# Patient Record
Sex: Female | Born: 1955 | Race: White | Hispanic: No | Marital: Married | Smoking: Former smoker
Health system: Southern US, Community
[De-identification: ages and names within clinical notes are randomized; demographics above are authoritative.]

## PROBLEM LIST (undated history)

## (undated) DIAGNOSIS — R7303 Prediabetes: Secondary | ICD-10-CM

## (undated) DIAGNOSIS — R51 Headache: Secondary | ICD-10-CM

## (undated) DIAGNOSIS — E785 Hyperlipidemia, unspecified: Secondary | ICD-10-CM

## (undated) DIAGNOSIS — R112 Nausea with vomiting, unspecified: Secondary | ICD-10-CM

## (undated) DIAGNOSIS — F419 Anxiety disorder, unspecified: Secondary | ICD-10-CM

## (undated) DIAGNOSIS — I82409 Acute embolism and thrombosis of unspecified deep veins of unspecified lower extremity: Secondary | ICD-10-CM

## (undated) DIAGNOSIS — I10 Essential (primary) hypertension: Secondary | ICD-10-CM

## (undated) DIAGNOSIS — Z923 Personal history of irradiation: Secondary | ICD-10-CM

## (undated) DIAGNOSIS — C73 Malignant neoplasm of thyroid gland: Secondary | ICD-10-CM

## (undated) DIAGNOSIS — C7931 Secondary malignant neoplasm of brain: Secondary | ICD-10-CM

## (undated) DIAGNOSIS — Z86010 Personal history of colonic polyps: Secondary | ICD-10-CM

## (undated) DIAGNOSIS — I2699 Other pulmonary embolism without acute cor pulmonale: Secondary | ICD-10-CM

## (undated) DIAGNOSIS — E049 Nontoxic goiter, unspecified: Secondary | ICD-10-CM

## (undated) DIAGNOSIS — N390 Urinary tract infection, site not specified: Secondary | ICD-10-CM

## (undated) DIAGNOSIS — R911 Solitary pulmonary nodule: Secondary | ICD-10-CM

## (undated) DIAGNOSIS — J189 Pneumonia, unspecified organism: Secondary | ICD-10-CM

## (undated) DIAGNOSIS — E119 Type 2 diabetes mellitus without complications: Secondary | ICD-10-CM

## (undated) DIAGNOSIS — F329 Major depressive disorder, single episode, unspecified: Secondary | ICD-10-CM

## (undated) DIAGNOSIS — B0089 Other herpesviral infection: Secondary | ICD-10-CM

## (undated) DIAGNOSIS — K219 Gastro-esophageal reflux disease without esophagitis: Secondary | ICD-10-CM

## (undated) DIAGNOSIS — J449 Chronic obstructive pulmonary disease, unspecified: Secondary | ICD-10-CM

## (undated) DIAGNOSIS — M797 Fibromyalgia: Secondary | ICD-10-CM

## (undated) DIAGNOSIS — K1233 Oral mucositis (ulcerative) due to radiation: Secondary | ICD-10-CM

## (undated) DIAGNOSIS — M1990 Unspecified osteoarthritis, unspecified site: Secondary | ICD-10-CM

## (undated) DIAGNOSIS — Z9889 Other specified postprocedural states: Secondary | ICD-10-CM

## (undated) DIAGNOSIS — F32A Depression, unspecified: Secondary | ICD-10-CM

## (undated) DIAGNOSIS — J439 Emphysema, unspecified: Secondary | ICD-10-CM

## (undated) DIAGNOSIS — Z860101 Personal history of adenomatous and serrated colon polyps: Secondary | ICD-10-CM

## (undated) HISTORY — DX: Personal history of colonic polyps: Z86.010

## (undated) HISTORY — DX: Emphysema, unspecified: J43.9

## (undated) HISTORY — DX: Acute embolism and thrombosis of unspecified deep veins of unspecified lower extremity: I82.409

## (undated) HISTORY — DX: Urinary tract infection, site not specified: N39.0

## (undated) HISTORY — DX: Solitary pulmonary nodule: R91.1

## (undated) HISTORY — DX: Personal history of adenomatous and serrated colon polyps: Z86.0101

## (undated) HISTORY — DX: Hyperlipidemia, unspecified: E78.5

## (undated) HISTORY — DX: Personal history of irradiation: Z92.3

## (undated) HISTORY — DX: Secondary malignant neoplasm of brain: C79.31

## (undated) HISTORY — PX: LYMPH GLAND EXCISION: SHX13

## (undated) HISTORY — DX: Malignant neoplasm of thyroid gland: C73

## (undated) HISTORY — DX: Other pulmonary embolism without acute cor pulmonale: I26.99

---

## 1994-04-05 HISTORY — PX: ABDOMINAL HYSTERECTOMY: SHX81

## 1996-04-05 HISTORY — PX: SHOULDER SURGERY: SHX246

## 1998-04-05 HISTORY — PX: CHOLECYSTECTOMY: SHX55

## 1998-06-23 ENCOUNTER — Other Ambulatory Visit: Admission: RE | Admit: 1998-06-23 | Discharge: 1998-06-23 | Payer: Self-pay | Admitting: Obstetrics and Gynecology

## 1998-08-13 ENCOUNTER — Encounter: Payer: Self-pay | Admitting: Obstetrics and Gynecology

## 1998-08-13 ENCOUNTER — Ambulatory Visit (HOSPITAL_COMMUNITY): Admission: RE | Admit: 1998-08-13 | Discharge: 1998-08-13 | Payer: Self-pay | Admitting: Obstetrics and Gynecology

## 1998-08-18 ENCOUNTER — Ambulatory Visit (HOSPITAL_COMMUNITY): Admission: RE | Admit: 1998-08-18 | Discharge: 1998-08-18 | Payer: Self-pay | Admitting: Obstetrics and Gynecology

## 1998-08-18 ENCOUNTER — Encounter: Payer: Self-pay | Admitting: Obstetrics and Gynecology

## 1999-04-06 HISTORY — PX: EYE SURGERY: SHX253

## 1999-08-06 ENCOUNTER — Encounter: Admission: RE | Admit: 1999-08-06 | Discharge: 1999-08-06 | Payer: Self-pay | Admitting: Obstetrics and Gynecology

## 1999-08-06 ENCOUNTER — Encounter: Payer: Self-pay | Admitting: Obstetrics and Gynecology

## 2000-12-15 ENCOUNTER — Ambulatory Visit (HOSPITAL_COMMUNITY): Admission: RE | Admit: 2000-12-15 | Discharge: 2000-12-15 | Payer: Self-pay | Admitting: Family Medicine

## 2000-12-15 ENCOUNTER — Encounter: Payer: Self-pay | Admitting: Family Medicine

## 2002-01-23 ENCOUNTER — Ambulatory Visit (HOSPITAL_COMMUNITY): Admission: RE | Admit: 2002-01-23 | Discharge: 2002-01-23 | Payer: Self-pay | Admitting: Family Medicine

## 2002-01-23 ENCOUNTER — Encounter: Payer: Self-pay | Admitting: Family Medicine

## 2002-05-03 ENCOUNTER — Other Ambulatory Visit: Admission: RE | Admit: 2002-05-03 | Discharge: 2002-05-03 | Payer: Self-pay | Admitting: Obstetrics and Gynecology

## 2003-02-01 ENCOUNTER — Ambulatory Visit (HOSPITAL_COMMUNITY): Admission: RE | Admit: 2003-02-01 | Discharge: 2003-02-01 | Payer: Self-pay | Admitting: Family Medicine

## 2003-04-06 HISTORY — PX: FOOT SURGERY: SHX648

## 2003-12-31 ENCOUNTER — Other Ambulatory Visit: Admission: RE | Admit: 2003-12-31 | Discharge: 2003-12-31 | Payer: Self-pay | Admitting: Obstetrics and Gynecology

## 2004-02-11 ENCOUNTER — Ambulatory Visit: Payer: Self-pay | Admitting: Internal Medicine

## 2004-10-16 ENCOUNTER — Ambulatory Visit (HOSPITAL_COMMUNITY): Admission: RE | Admit: 2004-10-16 | Discharge: 2004-10-16 | Payer: Self-pay | Admitting: Urology

## 2005-03-10 ENCOUNTER — Other Ambulatory Visit: Admission: RE | Admit: 2005-03-10 | Discharge: 2005-03-10 | Payer: Self-pay | Admitting: Obstetrics and Gynecology

## 2005-03-30 ENCOUNTER — Encounter (HOSPITAL_COMMUNITY): Admission: RE | Admit: 2005-03-30 | Discharge: 2005-04-29 | Payer: Self-pay | Admitting: Family Medicine

## 2005-10-07 ENCOUNTER — Ambulatory Visit (HOSPITAL_COMMUNITY): Admission: RE | Admit: 2005-10-07 | Discharge: 2005-10-07 | Payer: Self-pay | Admitting: Endocrinology

## 2006-07-08 ENCOUNTER — Ambulatory Visit (HOSPITAL_COMMUNITY): Admission: RE | Admit: 2006-07-08 | Discharge: 2006-07-08 | Payer: Self-pay | Admitting: Family Medicine

## 2006-10-21 ENCOUNTER — Ambulatory Visit (HOSPITAL_COMMUNITY): Admission: RE | Admit: 2006-10-21 | Discharge: 2006-10-21 | Payer: Self-pay | Admitting: Family Medicine

## 2007-01-23 ENCOUNTER — Ambulatory Visit: Payer: Self-pay | Admitting: Internal Medicine

## 2007-02-06 ENCOUNTER — Encounter: Payer: Self-pay | Admitting: Internal Medicine

## 2007-02-06 ENCOUNTER — Ambulatory Visit: Payer: Self-pay | Admitting: Internal Medicine

## 2007-03-14 ENCOUNTER — Ambulatory Visit (HOSPITAL_COMMUNITY): Admission: RE | Admit: 2007-03-14 | Discharge: 2007-03-14 | Payer: Self-pay | Admitting: Family Medicine

## 2009-03-20 ENCOUNTER — Ambulatory Visit (HOSPITAL_COMMUNITY): Admission: RE | Admit: 2009-03-20 | Discharge: 2009-03-20 | Payer: Self-pay | Admitting: Family Medicine

## 2009-04-05 HISTORY — PX: CATARACT EXTRACTION: SUR2

## 2010-05-22 ENCOUNTER — Ambulatory Visit (HOSPITAL_COMMUNITY): Payer: BC Managed Care – PPO

## 2010-05-22 ENCOUNTER — Other Ambulatory Visit (HOSPITAL_COMMUNITY): Payer: Self-pay | Admitting: Family Medicine

## 2010-05-22 ENCOUNTER — Ambulatory Visit (HOSPITAL_COMMUNITY)
Admission: RE | Admit: 2010-05-22 | Discharge: 2010-05-22 | Disposition: A | Discharge status: Home / Self Care | Payer: BC Managed Care – PPO | Source: Ambulatory Visit | Attending: Family Medicine | Admitting: Family Medicine

## 2010-05-22 DIAGNOSIS — R52 Pain, unspecified: Secondary | ICD-10-CM

## 2010-05-22 DIAGNOSIS — M25559 Pain in unspecified hip: Secondary | ICD-10-CM | POA: Insufficient documentation

## 2011-01-18 ENCOUNTER — Other Ambulatory Visit (HOSPITAL_COMMUNITY): Payer: Self-pay | Admitting: Internal Medicine

## 2011-01-18 DIAGNOSIS — E042 Nontoxic multinodular goiter: Secondary | ICD-10-CM

## 2011-01-21 ENCOUNTER — Ambulatory Visit (HOSPITAL_COMMUNITY)
Admission: RE | Admit: 2011-01-21 | Discharge: 2011-01-21 | Disposition: A | Discharge status: Home / Self Care | Payer: BC Managed Care – PPO | Source: Ambulatory Visit | Attending: Internal Medicine | Admitting: Internal Medicine

## 2011-01-21 DIAGNOSIS — E042 Nontoxic multinodular goiter: Secondary | ICD-10-CM | POA: Insufficient documentation

## 2011-03-10 ENCOUNTER — Other Ambulatory Visit (HOSPITAL_COMMUNITY): Payer: Self-pay | Admitting: Internal Medicine

## 2011-03-10 ENCOUNTER — Ambulatory Visit (HOSPITAL_COMMUNITY)
Admission: RE | Admit: 2011-03-10 | Discharge: 2011-03-10 | Disposition: A | Discharge status: Home / Self Care | Payer: BC Managed Care – PPO | Source: Ambulatory Visit | Attending: Internal Medicine | Admitting: Internal Medicine

## 2011-03-10 DIAGNOSIS — J069 Acute upper respiratory infection, unspecified: Secondary | ICD-10-CM

## 2011-03-10 DIAGNOSIS — R05 Cough: Secondary | ICD-10-CM

## 2011-03-10 DIAGNOSIS — R062 Wheezing: Secondary | ICD-10-CM

## 2011-03-10 DIAGNOSIS — R059 Cough, unspecified: Secondary | ICD-10-CM | POA: Insufficient documentation

## 2011-03-26 ENCOUNTER — Other Ambulatory Visit: Payer: Self-pay | Admitting: Otolaryngology

## 2011-09-06 ENCOUNTER — Other Ambulatory Visit (HOSPITAL_COMMUNITY): Payer: Self-pay | Admitting: Internal Medicine

## 2011-09-06 DIAGNOSIS — A5217 General paresis: Secondary | ICD-10-CM

## 2011-09-09 ENCOUNTER — Ambulatory Visit (HOSPITAL_COMMUNITY)
Admission: RE | Admit: 2011-09-09 | Discharge: 2011-09-09 | Disposition: A | Discharge status: Home / Self Care | Payer: BC Managed Care – PPO | Source: Ambulatory Visit | Attending: Family Medicine | Admitting: Family Medicine

## 2011-09-09 ENCOUNTER — Encounter (HOSPITAL_COMMUNITY): Payer: Self-pay

## 2011-09-09 ENCOUNTER — Other Ambulatory Visit (HOSPITAL_COMMUNITY): Payer: Self-pay | Admitting: Internal Medicine

## 2011-09-09 ENCOUNTER — Ambulatory Visit (HOSPITAL_COMMUNITY)
Admission: RE | Admit: 2011-09-09 | Discharge: 2011-09-09 | Disposition: A | Discharge status: Home / Self Care | Payer: BC Managed Care – PPO | Source: Ambulatory Visit | Attending: Internal Medicine | Admitting: Internal Medicine

## 2011-09-09 ENCOUNTER — Other Ambulatory Visit (HOSPITAL_COMMUNITY): Payer: Self-pay | Admitting: Family Medicine

## 2011-09-09 DIAGNOSIS — R220 Localized swelling, mass and lump, head: Secondary | ICD-10-CM

## 2011-09-09 DIAGNOSIS — R209 Unspecified disturbances of skin sensation: Secondary | ICD-10-CM | POA: Insufficient documentation

## 2011-09-09 DIAGNOSIS — R221 Localized swelling, mass and lump, neck: Secondary | ICD-10-CM | POA: Insufficient documentation

## 2011-09-09 DIAGNOSIS — J984 Other disorders of lung: Secondary | ICD-10-CM | POA: Insufficient documentation

## 2011-09-09 DIAGNOSIS — G9389 Other specified disorders of brain: Secondary | ICD-10-CM | POA: Insufficient documentation

## 2011-09-09 DIAGNOSIS — A5217 General paresis: Secondary | ICD-10-CM

## 2011-09-09 DIAGNOSIS — K229 Disease of esophagus, unspecified: Secondary | ICD-10-CM | POA: Insufficient documentation

## 2011-09-09 DIAGNOSIS — K769 Liver disease, unspecified: Secondary | ICD-10-CM | POA: Insufficient documentation

## 2011-09-09 MED ORDER — GADOBENATE DIMEGLUMINE 529 MG/ML IV SOLN
15.0000 mL | Freq: Once | INTRAVENOUS | Status: AC | PRN
  Administered 2011-09-09: 15 mL via INTRAVENOUS

## 2011-09-09 MED ORDER — IOHEXOL 300 MG/ML  SOLN
100.0000 mL | Freq: Once | INTRAMUSCULAR | Status: AC | PRN
  Administered 2011-09-09: 100 mL via INTRAVENOUS

## 2011-09-10 ENCOUNTER — Other Ambulatory Visit: Payer: Self-pay | Admitting: Neurosurgery

## 2011-09-10 ENCOUNTER — Other Ambulatory Visit (HOSPITAL_COMMUNITY): Payer: Self-pay | Admitting: Neurosurgery

## 2011-09-10 DIAGNOSIS — L089 Local infection of the skin and subcutaneous tissue, unspecified: Secondary | ICD-10-CM

## 2011-09-13 ENCOUNTER — Ambulatory Visit (HOSPITAL_COMMUNITY)
Admission: RE | Admit: 2011-09-13 | Discharge: 2011-09-13 | Disposition: A | Discharge status: Home / Self Care | Payer: BC Managed Care – PPO | Source: Ambulatory Visit | Attending: Neurosurgery | Admitting: Neurosurgery

## 2011-09-13 ENCOUNTER — Encounter (HOSPITAL_COMMUNITY): Payer: Self-pay

## 2011-09-13 DIAGNOSIS — M79609 Pain in unspecified limb: Secondary | ICD-10-CM | POA: Insufficient documentation

## 2011-09-13 DIAGNOSIS — L089 Local infection of the skin and subcutaneous tissue, unspecified: Secondary | ICD-10-CM | POA: Insufficient documentation

## 2011-09-13 DIAGNOSIS — X58XXXA Exposure to other specified factors, initial encounter: Secondary | ICD-10-CM | POA: Insufficient documentation

## 2011-09-13 DIAGNOSIS — R42 Dizziness and giddiness: Secondary | ICD-10-CM | POA: Insufficient documentation

## 2011-09-13 DIAGNOSIS — T148XXA Other injury of unspecified body region, initial encounter: Secondary | ICD-10-CM

## 2011-09-13 DIAGNOSIS — G9389 Other specified disorders of brain: Secondary | ICD-10-CM | POA: Insufficient documentation

## 2011-09-13 MED ORDER — IOHEXOL 300 MG/ML  SOLN
100.0000 mL | Freq: Once | INTRAMUSCULAR | Status: AC | PRN
  Administered 2011-09-13: 100 mL via INTRAVENOUS

## 2011-09-14 ENCOUNTER — Encounter (HOSPITAL_COMMUNITY)
Admission: RE | Admit: 2011-09-14 | Discharge: 2011-09-14 | Disposition: A | Discharge status: Home / Self Care | Payer: BC Managed Care – PPO | Source: Ambulatory Visit | Attending: Neurosurgery | Admitting: Neurosurgery

## 2011-09-14 ENCOUNTER — Encounter (HOSPITAL_COMMUNITY): Payer: Self-pay

## 2011-09-14 ENCOUNTER — Encounter (HOSPITAL_COMMUNITY): Payer: Self-pay | Admitting: Pharmacy Technician

## 2011-09-14 HISTORY — DX: Anxiety disorder, unspecified: F41.9

## 2011-09-14 HISTORY — DX: Other specified postprocedural states: Z98.890

## 2011-09-14 HISTORY — DX: Gastro-esophageal reflux disease without esophagitis: K21.9

## 2011-09-14 HISTORY — DX: Chronic obstructive pulmonary disease, unspecified: J44.9

## 2011-09-14 HISTORY — DX: Nausea with vomiting, unspecified: R11.2

## 2011-09-14 HISTORY — DX: Nontoxic goiter, unspecified: E04.9

## 2011-09-14 HISTORY — DX: Major depressive disorder, single episode, unspecified: F32.9

## 2011-09-14 HISTORY — DX: Fibromyalgia: M79.7

## 2011-09-14 HISTORY — DX: Headache: R51

## 2011-09-14 HISTORY — DX: Unspecified osteoarthritis, unspecified site: M19.90

## 2011-09-14 HISTORY — DX: Depression, unspecified: F32.A

## 2011-09-14 LAB — CBC
HCT: 44.6 % (ref 36.0–46.0)
Hemoglobin: 15 g/dL (ref 12.0–15.0)
MCH: 29.4 pg (ref 26.0–34.0)
MCHC: 33.6 g/dL (ref 30.0–36.0)
MCV: 87.5 fL (ref 78.0–100.0)
Platelets: 346 10*3/uL (ref 150–400)
RBC: 5.1 MIL/uL (ref 3.87–5.11)
RDW: 13.4 % (ref 11.5–15.5)
WBC: 16.6 10*3/uL — ABNORMAL HIGH (ref 4.0–10.5)

## 2011-09-14 LAB — BASIC METABOLIC PANEL
BUN: 16 mg/dL (ref 6–23)
CO2: 27 mEq/L (ref 19–32)
Calcium: 9.9 mg/dL (ref 8.4–10.5)
Chloride: 102 mEq/L (ref 96–112)
Creatinine, Ser: 0.58 mg/dL (ref 0.50–1.10)
GFR calc Af Amer: >90 mL/min (ref >90)
GFR calc non Af Amer: >90 mL/min (ref >90)
Glucose, Bld: 119 mg/dL — ABNORMAL HIGH (ref 70–99)
Potassium: 4.4 mEq/L (ref 3.5–5.1)
Sodium: 138 mEq/L (ref 135–145)

## 2011-09-14 LAB — SURGICAL PCR SCREEN
MRSA, PCR: NEGATIVE
Staphylococcus aureus: NEGATIVE

## 2011-09-14 LAB — TYPE AND SCREEN
ABO/RH(D): A POS
Antibody Screen: NEGATIVE

## 2011-09-14 LAB — ABO/RH: ABO/RH(D): A POS

## 2011-09-14 MED ORDER — VANCOMYCIN HCL IN DEXTROSE 1-5 GM/200ML-% IV SOLN
1000.0000 mg | INTRAVENOUS | Status: AC
  Administered 2011-09-15: 1000 mg via INTRAVENOUS
  Filled 2011-09-14: qty 200

## 2011-09-14 NOTE — Pre-Procedure Instructions (Signed)
[address] LA[address] E[address]  09/14/2011   Your procedure is scheduled on: 09-15-2011 Report to [facility] at!1:00 PM  Call this number if you have problems the morning of surgery: [phone]   Remember:   Do not eat food:After Midnight.  May have clear liquids: up to 4 Hours before arrival. Until 9:00 AM  Clear liquids include soda, tea, black coffee, apple or grape juice, broth.  Take these medicines the morning of surgery with A SIP OF WATER Xanax,Pepcid,  Keppra,Metoprolol,     Do not wear jewelry, make-up or nail polish.  Do not wear lotions, powders, or perfumes. You may wear deodorant.  Do not shave 48 hours prior to surgery. Men may shave face and neck.  Do not bring valuables to the hospital.  Contacts, dentures or bridgework may not be worn into surgery.  Leave suitcase in the car. After surgery it may be brought to your room.  For patients admitted to the hospital, checkout time is 11:00 AM the day of discharge.    Special Instructions: CHG Shower Use Special Wash: 1/2 bottle night before surgery and 1/2 bottle morning of surgery.   Please read over the following fact sheets that you were given: Pain Booklet, Coughing and Deep Breathing, Blood Transfusion Information, MRSA Information and Surgical Site Infection Prevention

## 2011-09-15 ENCOUNTER — Encounter (HOSPITAL_COMMUNITY): Payer: Self-pay | Admitting: *Deleted

## 2011-09-15 ENCOUNTER — Encounter (HOSPITAL_COMMUNITY)
Admission: RE | Disposition: A | Discharge status: Home / Self Care | Payer: Self-pay | Source: Ambulatory Visit | Attending: Neurosurgery

## 2011-09-15 ENCOUNTER — Inpatient Hospital Stay (HOSPITAL_COMMUNITY)
Admission: RE | Admit: 2011-09-15 | Discharge: 2011-09-19 | DRG: 001 | Disposition: A | Discharge status: Home / Self Care | Payer: BC Managed Care – PPO | Source: Ambulatory Visit | Attending: Neurosurgery | Admitting: Neurosurgery

## 2011-09-15 ENCOUNTER — Ambulatory Visit (HOSPITAL_COMMUNITY): Payer: BC Managed Care – PPO | Admitting: *Deleted

## 2011-09-15 DIAGNOSIS — R339 Retention of urine, unspecified: Secondary | ICD-10-CM | POA: Diagnosis not present

## 2011-09-15 DIAGNOSIS — Z90710 Acquired absence of both cervix and uterus: Secondary | ICD-10-CM

## 2011-09-15 DIAGNOSIS — Z880 Allergy status to penicillin: Secondary | ICD-10-CM

## 2011-09-15 DIAGNOSIS — I10 Essential (primary) hypertension: Secondary | ICD-10-CM | POA: Diagnosis present

## 2011-09-15 DIAGNOSIS — Z01812 Encounter for preprocedural laboratory examination: Secondary | ICD-10-CM

## 2011-09-15 DIAGNOSIS — C7931 Secondary malignant neoplasm of brain: Secondary | ICD-10-CM

## 2011-09-15 DIAGNOSIS — Z87891 Personal history of nicotine dependence: Secondary | ICD-10-CM

## 2011-09-15 DIAGNOSIS — D496 Neoplasm of unspecified behavior of brain: Principal | ICD-10-CM | POA: Diagnosis present

## 2011-09-15 DIAGNOSIS — E119 Type 2 diabetes mellitus without complications: Secondary | ICD-10-CM | POA: Diagnosis present

## 2011-09-15 DIAGNOSIS — Z7982 Long term (current) use of aspirin: Secondary | ICD-10-CM

## 2011-09-15 DIAGNOSIS — J438 Other emphysema: Secondary | ICD-10-CM | POA: Diagnosis present

## 2011-09-15 DIAGNOSIS — G8190 Hemiplegia, unspecified affecting unspecified side: Secondary | ICD-10-CM | POA: Diagnosis present

## 2011-09-15 HISTORY — DX: Secondary malignant neoplasm of brain: C79.31

## 2011-09-15 HISTORY — PX: CRANIOTOMY: SHX93

## 2011-09-15 LAB — GRAM STAIN

## 2011-09-15 LAB — GLUCOSE, CAPILLARY: Glucose-Capillary: 147 mg/dL — ABNORMAL HIGH (ref 70–99)

## 2011-09-15 SURGERY — CRANIOTOMY TUMOR EXCISION
Anesthesia: General | Site: Head | Wound class: Clean

## 2011-09-15 MED ORDER — THROMBIN 20000 UNITS EX KIT
PACK | CUTANEOUS | Status: DC | PRN
  Administered 2011-09-15: 17:00:00 via TOPICAL

## 2011-09-15 MED ORDER — VECURONIUM BROMIDE 10 MG IV SOLR
INTRAVENOUS | Status: DC | PRN
  Administered 2011-09-15 (×2): 1 mg via INTRAVENOUS
  Administered 2011-09-15: 4 mg via INTRAVENOUS
  Administered 2011-09-15: 2 mg via INTRAVENOUS

## 2011-09-15 MED ORDER — HYDROXYZINE HCL 50 MG/ML IM SOLN
50.0000 mg | INTRAMUSCULAR | Status: DC | PRN
  Administered 2011-09-17: 50 mg via INTRAMUSCULAR
  Filled 2011-09-15 (×2): qty 1

## 2011-09-15 MED ORDER — LABETALOL HCL 5 MG/ML IV SOLN
INTRAVENOUS | Status: DC | PRN
  Administered 2011-09-15: 2.5 mg via INTRAVENOUS
  Administered 2011-09-15: 5 mg via INTRAVENOUS
  Administered 2011-09-15: 2.5 mg via INTRAVENOUS

## 2011-09-15 MED ORDER — IRBESARTAN 75 MG PO TABS
37.5000 mg | ORAL_TABLET | Freq: Every day | ORAL | Status: DC
  Administered 2011-09-17 – 2011-09-18 (×2): 37.5 mg via ORAL
  Administered 2011-09-19: 11:00:00 via ORAL
  Filled 2011-09-15 (×4): qty 0.5

## 2011-09-15 MED ORDER — PROPOFOL 10 MG/ML IV EMUL
INTRAVENOUS | Status: DC | PRN
  Administered 2011-09-15: 30 mg via INTRAVENOUS
  Administered 2011-09-15: 50 mg via INTRAVENOUS
  Administered 2011-09-15: 10 mg via INTRAVENOUS
  Administered 2011-09-15: 180 mg via INTRAVENOUS
  Administered 2011-09-15: 50 mg via INTRAVENOUS

## 2011-09-15 MED ORDER — SODIUM CHLORIDE 0.9 % IV SOLN
INTRAVENOUS | Status: DC | PRN
  Administered 2011-09-15 (×2): via INTRAVENOUS

## 2011-09-15 MED ORDER — ALPRAZOLAM 0.25 MG PO TABS
0.2500 mg | ORAL_TABLET | Freq: Three times a day (TID) | ORAL | Status: DC | PRN
  Administered 2011-09-16 – 2011-09-19 (×7): 0.5 mg via ORAL
  Filled 2011-09-15 (×7): qty 2

## 2011-09-15 MED ORDER — SODIUM CHLORIDE 0.9 % IV SOLN
INTRAVENOUS | Status: AC
  Filled 2011-09-15: qty 500

## 2011-09-15 MED ORDER — HYDROXYZINE HCL 25 MG PO TABS
50.0000 mg | ORAL_TABLET | ORAL | Status: DC | PRN
  Administered 2011-09-16 (×2): 50 mg via ORAL
  Filled 2011-09-15 (×2): qty 1

## 2011-09-15 MED ORDER — GLYCOPYRROLATE 0.2 MG/ML IJ SOLN
INTRAMUSCULAR | Status: DC | PRN
  Administered 2011-09-15: .8 mg via INTRAVENOUS

## 2011-09-15 MED ORDER — ACETAMINOPHEN 10 MG/ML IV SOLN
INTRAVENOUS | Status: AC
  Filled 2011-09-15: qty 100

## 2011-09-15 MED ORDER — GENTAMICIN IN SALINE 1.6-0.9 MG/ML-% IV SOLN
80.0000 mg | INTRAVENOUS | Status: AC
  Filled 2011-09-15: qty 50

## 2011-09-15 MED ORDER — LIDOCAINE-EPINEPHRINE 1 %-1:100000 IJ SOLN
INTRAMUSCULAR | Status: DC | PRN
  Administered 2011-09-15: 11.5 mL

## 2011-09-15 MED ORDER — PANTOPRAZOLE SODIUM 40 MG IV SOLR
40.0000 mg | Freq: Every day | INTRAVENOUS | Status: DC
  Administered 2011-09-15 – 2011-09-16 (×2): 40 mg via INTRAVENOUS
  Filled 2011-09-15 (×3): qty 40

## 2011-09-15 MED ORDER — DEXAMETHASONE SODIUM PHOSPHATE 4 MG/ML IJ SOLN: 4.0000 mg | Freq: Three times a day (TID) | INTRAMUSCULAR | Status: DC

## 2011-09-15 MED ORDER — MIDAZOLAM HCL 5 MG/5ML IJ SOLN
INTRAMUSCULAR | Status: DC | PRN
  Administered 2011-09-15 (×2): 1 mg via INTRAVENOUS

## 2011-09-15 MED ORDER — HYDROMORPHONE HCL PF 1 MG/ML IJ SOLN: 0.2500 mg | INTRAMUSCULAR | Status: DC | PRN

## 2011-09-15 MED ORDER — MORPHINE SULFATE 2 MG/ML IJ SOLN
1.0000 mg | INTRAMUSCULAR | Status: DC | PRN
  Administered 2011-09-15 – 2011-09-17 (×18): 2 mg via INTRAVENOUS
  Filled 2011-09-15 (×18): qty 1

## 2011-09-15 MED ORDER — DEXAMETHASONE SODIUM PHOSPHATE 4 MG/ML IJ SOLN
INTRAMUSCULAR | Status: DC | PRN
  Administered 2011-09-15: 10 mg via INTRAVENOUS

## 2011-09-15 MED ORDER — ACETAMINOPHEN 10 MG/ML IV SOLN: 1000.0000 mg | Freq: Four times a day (QID) | INTRAVENOUS | Status: DC

## 2011-09-15 MED ORDER — NEOSTIGMINE METHYLSULFATE 1 MG/ML IJ SOLN
INTRAMUSCULAR | Status: DC | PRN
  Administered 2011-09-15: 5 mg via INTRAVENOUS

## 2011-09-15 MED ORDER — BISACODYL 10 MG RE SUPP
10.0000 mg | Freq: Every day | RECTAL | Status: DC | PRN
  Administered 2011-09-17 – 2011-09-19 (×3): 10 mg via RECTAL
  Filled 2011-09-15 (×3): qty 1

## 2011-09-15 MED ORDER — BACITRACIN 50000 UNITS IM SOLR
INTRAMUSCULAR | Status: AC
  Filled 2011-09-15: qty 1

## 2011-09-15 MED ORDER — ROCURONIUM BROMIDE 100 MG/10ML IV SOLN
INTRAVENOUS | Status: DC | PRN
  Administered 2011-09-15: 50 mg via INTRAVENOUS

## 2011-09-15 MED ORDER — DOCUSATE SODIUM 100 MG PO CAPS
100.0000 mg | ORAL_CAPSULE | Freq: Two times a day (BID) | ORAL | Status: DC
  Administered 2011-09-15 – 2011-09-19 (×8): 100 mg via ORAL
  Filled 2011-09-15 (×8): qty 1

## 2011-09-15 MED ORDER — ACYCLOVIR 5 % EX OINT
TOPICAL_OINTMENT | CUTANEOUS | Status: DC
  Administered 2011-09-16 (×7): via TOPICAL
  Administered 2011-09-16: 1 via TOPICAL
  Administered 2011-09-17 – 2011-09-19 (×16): via TOPICAL
  Filled 2011-09-15: qty 30

## 2011-09-15 MED ORDER — EPHEDRINE SULFATE 50 MG/ML IJ SOLN
INTRAMUSCULAR | Status: DC | PRN
  Administered 2011-09-15: 10 mg via INTRAVENOUS
  Administered 2011-09-15 (×2): 5 mg via INTRAVENOUS
  Administered 2011-09-15: 10 mg via INTRAVENOUS
  Administered 2011-09-15: 5 mg via INTRAVENOUS

## 2011-09-15 MED ORDER — HYDRALAZINE HCL 20 MG/ML IJ SOLN
5.0000 mg | INTRAMUSCULAR | Status: DC | PRN
  Filled 2011-09-15: qty 0.5

## 2011-09-15 MED ORDER — METHYLENE BLUE 1 % INJ SOLN
INTRAMUSCULAR | Status: DC | PRN
  Administered 2011-09-15: 10 mL

## 2011-09-15 MED ORDER — LIDOCAINE HCL (CARDIAC) 20 MG/ML IV SOLN
INTRAVENOUS | Status: DC | PRN
  Administered 2011-09-15: 100 mg via INTRAVENOUS

## 2011-09-15 MED ORDER — POTASSIUM CHLORIDE IN NACL 20-0.9 MEQ/L-% IV SOLN
INTRAVENOUS | Status: DC
  Administered 2011-09-15: 100 mL/h via INTRAVENOUS
  Administered 2011-09-16: 14:00:00 via INTRAVENOUS
  Administered 2011-09-17: 1000 mL via INTRAVENOUS
  Filled 2011-09-15 (×8): qty 1000

## 2011-09-15 MED ORDER — SODIUM CHLORIDE 0.9 % IR SOLN
Status: DC | PRN
  Administered 2011-09-15: 17:00:00

## 2011-09-15 MED ORDER — BUPIVACAINE HCL (PF) 0.25 % IJ SOLN
INTRAMUSCULAR | Status: DC | PRN
  Administered 2011-09-15: 11.5 mL

## 2011-09-15 MED ORDER — HEMOSTATIC AGENTS (NO CHARGE) OPTIME
TOPICAL | Status: DC | PRN
  Administered 2011-09-15: 1 via TOPICAL

## 2011-09-15 MED ORDER — HYDROCHLOROTHIAZIDE 10 MG/ML ORAL SUSPENSION
3.1250 mg | Freq: Every day | ORAL | Status: DC
  Administered 2011-09-17 – 2011-09-19 (×3): 3.125 mg via ORAL
  Filled 2011-09-15 (×4): qty 0.63

## 2011-09-15 MED ORDER — LORAZEPAM 2 MG/ML IJ SOLN: 1.0000 mg | Freq: Once | INTRAMUSCULAR | Status: DC | PRN

## 2011-09-15 MED ORDER — DEXAMETHASONE SODIUM PHOSPHATE 10 MG/ML IJ SOLN
6.0000 mg | Freq: Four times a day (QID) | INTRAMUSCULAR | Status: DC
  Administered 2011-09-15 – 2011-09-16 (×4): 6 mg via INTRAVENOUS
  Filled 2011-09-15 (×4): qty 0.6

## 2011-09-15 MED ORDER — SIMVASTATIN 10 MG PO TABS
10.0000 mg | ORAL_TABLET | Freq: Every day | ORAL | Status: DC
  Administered 2011-09-15 – 2011-09-18 (×4): 10 mg via ORAL
  Filled 2011-09-15 (×5): qty 1

## 2011-09-15 MED ORDER — DEXAMETHASONE SODIUM PHOSPHATE 4 MG/ML IJ SOLN
4.0000 mg | Freq: Four times a day (QID) | INTRAMUSCULAR | Status: DC
  Filled 2011-09-15 (×2): qty 1

## 2011-09-15 MED ORDER — 0.9 % SODIUM CHLORIDE (POUR BTL) OPTIME
TOPICAL | Status: DC | PRN
  Administered 2011-09-15 (×2): 1000 mL

## 2011-09-15 MED ORDER — ACETAMINOPHEN 10 MG/ML IV SOLN
1000.0000 mg | Freq: Four times a day (QID) | INTRAVENOUS | Status: AC
  Administered 2011-09-15 – 2011-09-16 (×4): 1000 mg via INTRAVENOUS
  Filled 2011-09-15 (×4): qty 100

## 2011-09-15 MED ORDER — ACETAMINOPHEN 10 MG/ML IV SOLN
INTRAVENOUS | Status: DC | PRN
  Administered 2011-09-15: 1000 mg via INTRAVENOUS

## 2011-09-15 MED ORDER — MAGNESIUM HYDROXIDE 400 MG/5ML PO SUSP
30.0000 mL | Freq: Every day | ORAL | Status: DC | PRN
  Administered 2011-09-19: 30 mL via ORAL
  Filled 2011-09-15: qty 30

## 2011-09-15 MED ORDER — FENTANYL CITRATE 0.05 MG/ML IJ SOLN
INTRAMUSCULAR | Status: DC | PRN
  Administered 2011-09-15 (×2): 50 ug via INTRAVENOUS
  Administered 2011-09-15: 100 ug via INTRAVENOUS
  Administered 2011-09-15: 50 ug via INTRAVENOUS
  Administered 2011-09-15: 100 ug via INTRAVENOUS

## 2011-09-15 MED ORDER — OLMESARTAN MEDOXOMIL-HCTZ 20-12.5 MG PO TABS: 0.2500 | ORAL_TABLET | Freq: Every day | ORAL | Status: DC

## 2011-09-15 MED ORDER — SODIUM CHLORIDE 0.9 % IV SOLN
INTRAVENOUS | Status: DC | PRN
  Administered 2011-09-15: 15:00:00 via INTRAVENOUS

## 2011-09-15 MED ORDER — ONDANSETRON HCL 4 MG/2ML IJ SOLN
INTRAMUSCULAR | Status: DC | PRN
  Administered 2011-09-15: 4 mg via INTRAVENOUS

## 2011-09-15 MED ORDER — SODIUM CHLORIDE 0.9 % IV SOLN
500.0000 mg | Freq: Two times a day (BID) | INTRAVENOUS | Status: DC
  Administered 2011-09-15 – 2011-09-17 (×4): 500 mg via INTRAVENOUS
  Filled 2011-09-15 (×5): qty 5

## 2011-09-15 MED ORDER — METOPROLOL TARTRATE 25 MG PO TABS
25.0000 mg | ORAL_TABLET | Freq: Two times a day (BID) | ORAL | Status: DC
  Administered 2011-09-17 – 2011-09-19 (×4): 25 mg via ORAL
  Filled 2011-09-15 (×9): qty 1

## 2011-09-15 MED ORDER — INSULIN ASPART 100 UNIT/ML ~~LOC~~ SOLN
0.0000 [IU] | SUBCUTANEOUS | Status: DC
  Administered 2011-09-15: 2 [IU] via SUBCUTANEOUS
  Administered 2011-09-16: 8 [IU] via SUBCUTANEOUS
  Administered 2011-09-16: 5 [IU] via SUBCUTANEOUS
  Administered 2011-09-16: 2 [IU] via SUBCUTANEOUS
  Administered 2011-09-16: 3 [IU] via SUBCUTANEOUS
  Administered 2011-09-16: 2 [IU] via SUBCUTANEOUS
  Administered 2011-09-17: 3 [IU] via SUBCUTANEOUS
  Administered 2011-09-17: 2 [IU] via SUBCUTANEOUS
  Administered 2011-09-17 (×2): 3 [IU] via SUBCUTANEOUS
  Administered 2011-09-17: 2 [IU] via SUBCUTANEOUS
  Administered 2011-09-18 (×2): 3 [IU] via SUBCUTANEOUS
  Administered 2011-09-19 (×3): 2 [IU] via SUBCUTANEOUS

## 2011-09-15 MED ORDER — LABETALOL HCL 5 MG/ML IV SOLN: 5.0000 mg | INTRAVENOUS | Status: DC | PRN

## 2011-09-15 MED ORDER — GENTAMICIN IN SALINE 1.6-0.9 MG/ML-% IV SOLN
INTRAVENOUS | Status: DC | PRN
  Administered 2011-09-15: 80 mg via INTRAVENOUS

## 2011-09-15 SURGICAL SUPPLY — 83 items
APPLICATOR COTTON TIP 6IN STRL (MISCELLANEOUS) ×2 IMPLANT
BAG DECANTER FOR FLEXI CONT (MISCELLANEOUS) ×2 IMPLANT
BALL CTTN LRG ABS STRL LF (GAUZE/BANDAGES/DRESSINGS)
BANDAGE GAUZE 4  KLING STR (GAUZE/BANDAGES/DRESSINGS) ×3 IMPLANT
BANDAGE GAUZE ELAST BULKY 4 IN (GAUZE/BANDAGES/DRESSINGS) ×3 IMPLANT
BIT DRILL WIRE PASS 1.3MM (BIT) IMPLANT
BLADE SURG ROTATE 9660 (MISCELLANEOUS) ×3 IMPLANT
BLADE ULTRA TIP 2M (BLADE) IMPLANT
BRUSH SCRUB EZ PLAIN DRY (MISCELLANEOUS) ×2 IMPLANT
BUR ACORN 6.0 PRECISION (BURR) ×2 IMPLANT
BUR MATCHSTICK NEURO 3.0 LAGG (BURR) IMPLANT
BUR ROUTER D-58 CRANI (BURR) ×1 IMPLANT
CANISTER SUCTION 2500CC (MISCELLANEOUS) ×3 IMPLANT
CLIP TI MEDIUM 6 (CLIP) IMPLANT
CLOTH BEACON ORANGE TIMEOUT ST (SAFETY) ×2 IMPLANT
CONT SPEC 4OZ CLIKSEAL STRL BL (MISCELLANEOUS) ×7 IMPLANT
CORDS BIPOLAR (ELECTRODE) ×2 IMPLANT
COTTONBALL LRG STERILE PKG (GAUZE/BANDAGES/DRESSINGS) IMPLANT
DRAIN SNY WOU 7FLT (WOUND CARE) IMPLANT
DRAIN SUBARACHNOID (WOUND CARE) IMPLANT
DRAPE MICROSCOPE LEICA (MISCELLANEOUS) ×1 IMPLANT
DRAPE NEUROLOGICAL W/INCISE (DRAPES) ×2 IMPLANT
DRAPE STERI IOBAN 125X83 (DRAPES) IMPLANT
DRAPE SURG 17X23 STRL (DRAPES) IMPLANT
DRAPE WARM FLUID 44X44 (DRAPE) ×2 IMPLANT
DRILL WIRE PASS 1.3MM (BIT)
DRSG ADAPTIC 3X8 NADH LF (GAUZE/BANDAGES/DRESSINGS) ×2 IMPLANT
ELECT CAUTERY BLADE 6.4 (BLADE) ×2 IMPLANT
ELECT REM PT RETURN 9FT ADLT (ELECTROSURGICAL) ×2
ELECTRODE REM PT RTRN 9FT ADLT (ELECTROSURGICAL) ×1 IMPLANT
EVACUATOR 1/8 PVC DRAIN (DRAIN) IMPLANT
EVACUATOR SILICONE 100CC (DRAIN) IMPLANT
GLOVE BIOGEL PI IND STRL 7.5 (GLOVE) IMPLANT
GLOVE BIOGEL PI IND STRL 8 (GLOVE) ×1 IMPLANT
GLOVE BIOGEL PI IND STRL 8.5 (GLOVE) IMPLANT
GLOVE BIOGEL PI INDICATOR 7.5 (GLOVE) ×1
GLOVE BIOGEL PI INDICATOR 8 (GLOVE) ×1
GLOVE BIOGEL PI INDICATOR 8.5 (GLOVE) ×1
GLOVE ECLIPSE 7.5 STRL STRAW (GLOVE) ×5 IMPLANT
GLOVE ECLIPSE 8.5 STRL (GLOVE) ×1 IMPLANT
GLOVE EXAM NITRILE LRG STRL (GLOVE) IMPLANT
GLOVE EXAM NITRILE MD LF STRL (GLOVE) ×2 IMPLANT
GLOVE EXAM NITRILE XL STR (GLOVE) IMPLANT
GLOVE EXAM NITRILE XS STR PU (GLOVE) IMPLANT
GOWN BRE IMP SLV AUR LG STRL (GOWN DISPOSABLE) IMPLANT
GOWN BRE IMP SLV AUR XL STRL (GOWN DISPOSABLE) ×2 IMPLANT
GOWN STRL REIN 2XL LVL4 (GOWN DISPOSABLE) ×1 IMPLANT
HEMOSTAT SURGICEL 2X14 (HEMOSTASIS) ×3 IMPLANT
HOOK DURA (MISCELLANEOUS) ×1 IMPLANT
KIT BASIN OR (CUSTOM PROCEDURE TRAY) ×2 IMPLANT
KIT ROOM TURNOVER OR (KITS) ×2 IMPLANT
MARKER SPHERE PSV REFLC NDI (MISCELLANEOUS) ×2 IMPLANT
NDL SPNL 22GX3.5 QUINCKE BK (NEEDLE) ×1 IMPLANT
NEEDLE SPNL 22GX3.5 QUINCKE BK (NEEDLE) ×2 IMPLANT
NS IRRIG 1000ML POUR BTL (IV SOLUTION) ×4 IMPLANT
PACK CRANIOTOMY (CUSTOM PROCEDURE TRAY) ×2 IMPLANT
PAD ARMBOARD 7.5X6 YLW CONV (MISCELLANEOUS) ×2 IMPLANT
PAD EYE OVAL STERILE LF (GAUZE/BANDAGES/DRESSINGS) IMPLANT
PATTIES SURGICAL .25X.25 (GAUZE/BANDAGES/DRESSINGS) IMPLANT
PATTIES SURGICAL .5 X1 (DISPOSABLE) ×1 IMPLANT
PATTIES SURGICAL 1/4 X 3 (GAUZE/BANDAGES/DRESSINGS) ×1 IMPLANT
PATTIES SURGICAL 3 X3 (GAUZE/BANDAGES/DRESSINGS)
PATTIES SURGICAL 3X3 (GAUZE/BANDAGES/DRESSINGS) IMPLANT
PLATE 1.5  2HOLE MED NEURO (Plate) ×1 IMPLANT
PLATE 1.5 2HOLE MED NEURO (Plate) IMPLANT
PLATE 1.5/0.5 13MM BURR HOLE (Plate) ×1 IMPLANT
PLATE 1.5/0.5 18.5MM BURR HOLE (Plate) ×1 IMPLANT
RUBBERBAND STERILE (MISCELLANEOUS) ×1 IMPLANT
SCREW SELF DRILL HT 1.5/4MM (Screw) ×9 IMPLANT
SPONGE GAUZE 4X4 12PLY (GAUZE/BANDAGES/DRESSINGS) ×2 IMPLANT
SPONGE SURGIFOAM ABS GEL 100 (HEMOSTASIS) ×2 IMPLANT
STAPLER SKIN PROX WIDE 3.9 (STAPLE) ×2 IMPLANT
SUT NURALON 4 0 TR CR/8 (SUTURE) ×6 IMPLANT
SUT VIC AB 0 CT1 18XCR BRD8 (SUTURE) ×2 IMPLANT
SUT VIC AB 0 CT1 8-18 (SUTURE) ×4
SUT VIC AB 2-0 CP2 18 (SUTURE) ×4 IMPLANT
SYR 20ML ECCENTRIC (SYRINGE) ×2 IMPLANT
SYR CONTROL 10ML LL (SYRINGE) ×3 IMPLANT
TOWEL OR 17X24 6PK STRL BLUE (TOWEL DISPOSABLE) ×2 IMPLANT
TOWEL OR 17X26 10 PK STRL BLUE (TOWEL DISPOSABLE) ×2 IMPLANT
TRAY FOLEY CATH 16FRSI W/METER (SET/KITS/TRAYS/PACK) IMPLANT
UNDERPAD 30X30 INCONTINENT (UNDERPADS AND DIAPERS) ×1 IMPLANT
WATER STERILE IRR 1000ML POUR (IV SOLUTION) ×3 IMPLANT

## 2011-09-15 NOTE — Progress Notes (Signed)
Filed Vitals:   09/15/11 1318 09/15/11 1759 09/15/11 1815  BP: 127/86 135/73 134/86  Pulse: 58 68 71  Temp: 97.8 F (36.6 C) 98.2 F (36.8 C)   TempSrc: Oral    Resp: 20 11 14   SpO2: 97% 99% 98%    Patient examined in PACU. Awake and alert, oriented to name, [facility], and 2013. Following commands with all 4 extremities. Dressing clean and dry. Foley to straight drainage.  Plan: To be transferred soon to ICU. Have ordered Decadron postoperatively, as well as sliding-scale insulin. To continue on Keppra. Spoke with the patient's family about intraoperative findings, and plans for postoperative care. Dr. Danielle Dess to cover for me over the next several days while out of town.  [name], MD 09/15/2011, 6:29 PM

## 2011-09-15 NOTE — H&P (Signed)
Haley Lowe  DOB:  06-25-1955    HISTORY OF PRESENT ILLNESS:  The patient is a 56 year old right-handed white female who is seen in neurosurgical consultation at the request of Dr. Elfredia Nevins regarding a left posterior frontal brain tumor.  The patient explains she began having difficulties with the use of her left hand about three weeks ago.  The dysfunction has progressively worsened over the past three weeks.  She denies any symptoms of the right lower extremity nor any speech dysfunction.  She denies any seizures or headaches.   She was evaluated with an MRI of the brain without and with contrast that revealed a ring-enhancing mass in the left motor strip with associated surrounding vasogenic cerebral edema.  Further workup was done with CT scan of the chest, abdomen and pelvis that did not reveal obvious malignancy.  Neurosurgical consultation was requested by Dr. Sherwood Gambler on an urgent basis.    PAST MEDICAL HISTORY:  Notable for history of hypertension and diabetes as well as emphysema.  She does not describe any previous history of cancer nor any history of myocardial infarction, stroke or peptic ulcer disease.  Previous surgery includes hysterectomy in 1996, right shoulder surgery in 1998, cholecystectomy in 2000, surgeon for vision correction in 2001, and surgery for cataracts with lens implant in 2011, left foot surgery in 2005.  SHE REPORTS AN ALLERGY TO PENICILLIN CAUSING SWELLING.  Current medications include Metoprolol 25 mg. b.i.d., Benicar/Hydrochlorothiazide 20/12.5 (1/4 tablet q.d.), aspirin 81 mg. q.d., fish oil 1000 mg. t.i.d. and Zocor 10 mg. q.d.    FAMILY HISTORY:    Mother died at age 18.  She had asthma.  Father died at age 74, he had kidney failure.  Family history is significant for hypertension, diabetes, myocardial infarction, colon cancer, chronic obstructive pulmonary disease, asthma, brain aneurysm and Parkinson's disease.      SOCIAL HISTORY:    The patient is  married.  She is unemployed.  She quit smoking 04/06/2011.  She had been smoking for 33 years prior to that about a pack a day.  She drinks alcoholic beverages socially.  She denies a history of substance abuse.    REVIEW OF SYSTEMS:   Notable for those difficulties described in the History of Present Illness and Past Medical History but her 14 point Review of Systems sheet is otherwise unremarkable.    PHYSICAL EXAMINATION:  The patient is a well developed, well nourished white female in no acute distress.  Ht. 5'3". Wt. 175 pounds. She is afebrile. Lungs are clear to auscultation.  She has symmetrical respiratory excursion.  Heart: regular rate and rhythm.  Normal S1 and S2.  No murmur.  Extremity examination shows moderate clubbing.  There is no cyanosis or edema.   NEUROLOGICAL EXAMINATION: On mental status, the patient is awake and alert.  She is oriented to name, [place], [place] and June 7th, 2013.  Cranial nerves show pupils the left is 3 mm and the right is 4 mm, both are round and reactive to light.  EOM's are intact.  Facial sensation is intact. Facial movement is symmetrical.  Hearing is present bilaterally.  Palate movement is symmetrical. Shoulder shrug is symmetrical.  Tongue is midline.  Motor examination shows moderate right pronator drift.  The right grip is 4/5.  The right iliopsoas is 4+/5, the right quadriceps is 4+ to 5/5.  The remainder of the right-sided strength is 5/5 including the deltoid, biceps, triceps and intrinsics as well as the dorsiflexors, extensor hallucis  longus and plantar flexors.  The entire left-sided strength is 5/5 including the deltoid, biceps, triceps, intrinsics grip, iliopsoas, quadriceps, dorsiflexors, extensor hallucis longus and plantar flexors.  Sensation is intact to pin prick in the distal upper and lower extremities.  Reflexes: left biceps and brachioradialis are minimal, left triceps is 1-2; right biceps and brachioradialis and triceps are trace to 1.  The  left quadriceps is 1.  The right quadriceps is 1-2. Left gastrocnemius is minimal, right gastrocnemius is absent. Left toe is equivocal to upgoing. Right toe is mildly upgoing.  She has a normal gait and stance.    DIAGNOSTIC STUDIES:   MRI of the brain done without and with contrast reveals a little over 2 cm. in diameter ring-enhancing lesion in the left motor strip.  There is a slight thickening to one aspect of the wall but most of it is cystic fluid.  There is associated vasogenic cerebral edema with localized mass effect.    Report of CT scan of the chest, abdomen and pelvis did not identify any obvious mass lesions.    The patient's family reports to me that her last mammogram was in July 2012 and did not reveal any evidence of masses or other pathology.  IMPRESSION:    Patient with solitary ring-enhancing lesion in the left motor strip associated with right hemiparesis involving both the upper and lower extremity.  Weakness has been progressively worsening over the past three weeks.  This lesion most likely represents a metastatic tumor despite the normal CT scan of the chest, abdomen and pelvis.  A primary tumor is less likely and a brain abscess is even less likely.    RECOMMENDATIONS:   I discussed my assessment and impression with the patient, three of her daughters and her husband and reviewed her MRI of the brain with them.  I favor surgical resection of this lesion via craniotomy with Stealth intraoperative stereotaxis.  I discussed the possible pathologies and the importance of pathologic examination of the tissue understanding that further recommendations regarding treatment will depend on the pathologic determination.  I have explained the need for preop CT Stealth protocol of the brain.  We discussed risks of surgery including risk of infection, bleeding, possible need for transfusion, risk of increased neurologic dysfunction including increased weakness on the right side (paralysis)  as well as risks of seizures and anesthetic complications of myocardial infarction, stroke, pneumonia and death.    I have also recommended that we initiate treatment with Decadron, Pepcid and Keppra (for seizure prophylaxis).  The patient's, her husband's and her children's questions were answered for them.  They do want to proceed with surgery.    I have given them a prescription for Pepcid 20 mg. b.i.d. #60 tablets with one refill, Decadron 4 mg. tablets 1 t.i.d. #50 tablets with one refill, and Keppra 500 mg. b.i.d. #60 tablets and six refills.  I have advised her to stop her Aspirin and that she should not consume any alcoholic beverages.    All of their questions were answered for them.    I did explain to the patient and her family that I will be leaving the town the day after surgery for four days and that I will have my partner, Dr. Barnett Abu, cover in my absence until I return on the 17th.     [facility]         [name], M.D.

## 2011-09-15 NOTE — Transfer of Care (Signed)
Immediate Anesthesia Transfer of Care Note  Patient: Haley Lowe  Procedure(s) Performed: Procedure(s) (LRB): CRANIOTOMY TUMOR EXCISION (N/A)  Patient Location: PACU  Anesthesia Type: General  Level of Consciousness: awake, oriented, unresponsive and responds to stimulation  Airway & Oxygen Therapy: Patient Spontanous Breathing and Patient connected to nasal cannula oxygen  Post-op Assessment: Report given to PACU RN, Post -op Vital signs reviewed and stable and Patient moving all extremities X 4  Post vital signs: Reviewed and stable  Complications: No apparent anesthesia complications

## 2011-09-15 NOTE — Op Note (Signed)
09/15/2011  5:43 PM  PATIENT:  Haley Lowe  56 y.o. female  PRE-OPERATIVE DIAGNOSIS:  Brain Tumor  POST-OPERATIVE DIAGNOSIS:  Brain Tumor  PROCEDURE:  Procedure(s): CRANIOTOMY TUMOR EXCISION: left frontoparietal craniotomy with intraoperative Stealth frameless stereotaxis and resection of left posterior frontal brain tumor  SURGEON:  Surgeon(s): [name], MD Barnett Abu, MD  ASSISTANTS: Barnett Abu, M.D.  ANESTHESIA:   general  EBL:  Total I/O In: 1000 [I.V.:1000] Out: 105 [Urine:105]  BLOOD ADMINISTERED:none  COUNT: Correct per nursing staff  DRAINS: none   SPECIMEN:  1) tumor cyst fluid for a stat Gram stain, aerobic culture, anaerobic culture, and cytology                       2) tumor for permanent pathology  DICTATION: Patient was brought to the operating room, placed under general endotracheal anesthesia. Patient was placed in a 3 pin Mayfield head holder, with a roll beneath the left shoulder and the head and torso gently turned to the right. The Stealth intraoperative stereotactic unit had been loaded with the patient's Stealth protocol CT, preoperative planning had been performed, and the patient was registered to the Stealth unit and matched with the CT data. We then shaved the scalp over the planned left frontoparietal incision. The scalp was then prepped with Betadine soap and solution. A vertical left frontal parietal incision was made, the line of the incision was infiltrated with local anesthetic with epinephrine. The Stealth unit was used to plan the incision, and to localize the lesion throughout our approach to it. Raney clips were applied for scalp edge is to maintain hemostasis. Self-retaining retractors were placed, and the scalp retracted. A craniotomy was created by making 2 bur holes with the high-speed drill. The edges of bone were waxed as needed. The dura was dissected from the overlying skull. And then the craniotome attachment was used to  turn a bone flap. The bone flap was wrapped in a saline moistened gauze and set aside to be reimplanted at the conclusion of the procedure. We again used the Stealth unit to localize the lesion and plantar dural opening. The dura was opened in a U-shaped fashion hinged towards the midline, over the lesion. Under direct vision the underlying gyrus was expanded and clearly the lesion was within it. The peel surface was coagulated and a anterior posterior direction and incised, and we passed a brain cannula into the tumor cyst. Yellowish slightly turbid fluid was aspirated, and sent for stat Gram stain, aerobic culture, anaerobic culture, and cytology. The operating microscope was then draped and brought in the field to provide additional magnification, illumination, and visualization. We dissected into the tumor cyst, and examined the walls of the cyst, which clearly had abnormal tissue consistent with tumor. This thin wall of tumor cyst was removed in a piecemeal fashion, and sent as a specimen to pathology for permanent examination. We establish hemostasis with bipolar cautery, and the tumor cavity was irrigated with saline solution until clear. We then proceeded with closure. The dura was closed with interrupted 4-0 Nurolon sutures. The dura was tacked up around the margins of the craniotomy with 4-0 Nurolon sutures. And a Poppen stitch was placed to tack the dura up to the bone flap. We secured the bone flap to the margins of the craniotomy with Lorenz cranial plates and screws. We then proceeded with closure of the scalp. The galea was closed with interrupted inverted undyed 0 Vicryl sutures. Skin  edges were closed with surgical staples. The wounds dressed with Adaptic and sterile gauze, and wrapped with 2 Curlex, and one Kling. The patient was then taken out of the 3 pin Mayfield head holder, to be reversed and the anesthetic, extubated, and transferred to the recovery room for further care.   PLAN OF CARE:  Admit to inpatient   PATIENT DISPOSITION:  PACU - hemodynamically stable.   Delay start of Pharmacological VTE agent (>24hrs) due to surgical blood loss or risk of bleeding:  yes

## 2011-09-15 NOTE — Anesthesia Postprocedure Evaluation (Signed)
  Anesthesia Post-op Note  Patient: Haley Lowe  Procedure(s) Performed: Procedure(s) (LRB): CRANIOTOMY TUMOR EXCISION (N/A)  Patient Location: PACU  Anesthesia Type: General  Level of Consciousness: awake, alert  and oriented  Airway and Oxygen Therapy: Patient Spontanous Breathing and Patient connected to nasal cannula oxygen  Post-op Pain: mild  Post-op Assessment: Post-op Vital signs reviewed, Patient's Cardiovascular Status Stable, Respiratory Function Stable, Patent Airway and No signs of Nausea or vomiting  Post-op Vital Signs: Reviewed and stable  Complications: No apparent anesthesia complications

## 2011-09-15 NOTE — Anesthesia Preprocedure Evaluation (Signed)
Anesthesia Evaluation  Patient identified by MRN, date of birth, ID band Patient awake    Reviewed: Allergy & Precautions, H&P , NPO status , Patient's Chart, lab work & pertinent test results  History of Anesthesia Complications (+) PONV  Airway Mallampati: II TM Distance: >3 FB Neck ROM: Full    Dental   Pulmonary COPDformer smoker Active cough + rhonchi         Cardiovascular hypertension,     Neuro/Psych  Headaches, Anxiety Depression  Neuromuscular disease    GI/Hepatic GERD-  ,  Endo/Other    Renal/GU      Musculoskeletal  (+) Fibromyalgia -  Abdominal (+) + obese,   Peds  Hematology   Anesthesia Other Findings   Reproductive/Obstetrics                           Anesthesia Physical Anesthesia Plan  ASA: III  Anesthesia Plan: General   Post-op Pain Management:    Induction: Intravenous  Airway Management Planned: Oral ETT  Additional Equipment: Arterial line  Intra-op Plan:   Post-operative Plan: Extubation in OR  Informed Consent: I have reviewed the patients History and Physical, chart, labs and discussed the procedure including the risks, benefits and alternatives for the proposed anesthesia with the patient or authorized representative who has indicated his/her understanding and acceptance.     Plan Discussed with: CRNA and Surgeon  Anesthesia Plan Comments:         Anesthesia Quick Evaluation

## 2011-09-16 ENCOUNTER — Encounter (HOSPITAL_COMMUNITY): Payer: Self-pay | Admitting: *Deleted

## 2011-09-16 LAB — DIFFERENTIAL
Basophils Absolute: 0 10*3/uL (ref 0.0–0.1)
Basophils Relative: 0 % (ref 0–1)
Eosinophils Absolute: 0 10*3/uL (ref 0.0–0.7)
Eosinophils Relative: 0 % (ref 0–5)
Lymphocytes Relative: 6 % — ABNORMAL LOW (ref 12–46)
Lymphs Abs: 1 10*3/uL (ref 0.7–4.0)
Monocytes Absolute: 1.4 10*3/uL — ABNORMAL HIGH (ref 0.1–1.0)
Monocytes Relative: 8 % (ref 3–12)
Neutro Abs: 14.6 10*3/uL — ABNORMAL HIGH (ref 1.7–7.7)
Neutrophils Relative %: 85 % — ABNORMAL HIGH (ref 43–77)

## 2011-09-16 LAB — BASIC METABOLIC PANEL
BUN: 14 mg/dL (ref 6–23)
CO2: 23 mEq/L (ref 19–32)
Calcium: 8 mg/dL — ABNORMAL LOW (ref 8.4–10.5)
Chloride: 107 mEq/L (ref 96–112)
Creatinine, Ser: 0.45 mg/dL — ABNORMAL LOW (ref 0.50–1.10)
GFR calc Af Amer: >90 mL/min (ref >90)
GFR calc non Af Amer: >90 mL/min (ref >90)
Glucose, Bld: 143 mg/dL — ABNORMAL HIGH (ref 70–99)
Potassium: 4 mEq/L (ref 3.5–5.1)
Sodium: 141 mEq/L (ref 135–145)

## 2011-09-16 LAB — GLUCOSE, CAPILLARY
Glucose-Capillary: 146 mg/dL — ABNORMAL HIGH (ref 70–99)
Glucose-Capillary: 153 mg/dL — ABNORMAL HIGH (ref 70–99)
Glucose-Capillary: 190 mg/dL — ABNORMAL HIGH (ref 70–99)
Glucose-Capillary: 234 mg/dL — ABNORMAL HIGH (ref 70–99)
Glucose-Capillary: 259 mg/dL — ABNORMAL HIGH (ref 70–99)

## 2011-09-16 LAB — CBC
HCT: 38.8 % (ref 36.0–46.0)
Hemoglobin: 12.9 g/dL (ref 12.0–15.0)
MCH: 29.1 pg (ref 26.0–34.0)
MCHC: 33.2 g/dL (ref 30.0–36.0)
MCV: 87.4 fL (ref 78.0–100.0)
Platelets: 268 10*3/uL (ref 150–400)
RBC: 4.44 MIL/uL (ref 3.87–5.11)
RDW: 13.6 % (ref 11.5–15.5)
WBC: 17.1 10*3/uL — ABNORMAL HIGH (ref 4.0–10.5)

## 2011-09-16 MED ORDER — DIPHENHYDRAMINE HCL 25 MG PO CAPS
25.0000 mg | ORAL_CAPSULE | ORAL | Status: DC | PRN
  Administered 2011-09-16 – 2011-09-18 (×6): 25 mg via ORAL
  Filled 2011-09-16 (×6): qty 1

## 2011-09-16 MED ORDER — HYDROCODONE-ACETAMINOPHEN 5-325 MG PO TABS
1.0000 | ORAL_TABLET | ORAL | Status: DC | PRN
  Administered 2011-09-19: 2 via ORAL
  Filled 2011-09-16 (×3): qty 2

## 2011-09-16 MED ORDER — DEXAMETHASONE SODIUM PHOSPHATE 4 MG/ML IJ SOLN
4.0000 mg | Freq: Two times a day (BID) | INTRAMUSCULAR | Status: DC
  Administered 2011-09-16 – 2011-09-17 (×3): 4 mg via INTRAVENOUS
  Filled 2011-09-16 (×4): qty 1

## 2011-09-16 MED ORDER — OXYCODONE HCL 5 MG PO TABS
5.0000 mg | ORAL_TABLET | ORAL | Status: DC | PRN
  Administered 2011-09-16 – 2011-09-19 (×3): 10 mg via ORAL
  Filled 2011-09-16 (×3): qty 2

## 2011-09-16 MED ORDER — OXYCODONE-ACETAMINOPHEN 5-325 MG PO TABS
2.0000 | ORAL_TABLET | ORAL | Status: DC | PRN
  Administered 2011-09-16 – 2011-09-19 (×12): 2 via ORAL
  Filled 2011-09-16 (×14): qty 2

## 2011-09-16 NOTE — Progress Notes (Addendum)
Patient ID: Haley Lowe, female   DOB: July 07, 1955, 56 y.o.   MRN: [id number] Vital signs stable. Patient is an alert and oriented. Complains of pain and scalp related to surgical site. IV medication working poorly.  Examination shows no evidence of a drift speech is clear and fluent thought processes are intact. Cranial nerves show face to be symmetric pupils are equal round and reactive. Tongue and uvula protruded in the midline  Will write for some oral pain medications encourage patient to walk and ambulate we'll see how she does in the unit today possible transfer to floor tomorrow. Patient has been complaining of indigestion in addition to the poor sleep requesting sleep aid. I've advised that we will taper Decadron more rapidly than is written as the Decadron is likely contributing to both these problems.

## 2011-09-17 ENCOUNTER — Encounter (HOSPITAL_COMMUNITY): Payer: Self-pay | Admitting: Neurosurgery

## 2011-09-17 LAB — GLUCOSE, CAPILLARY
Glucose-Capillary: 143 mg/dL — ABNORMAL HIGH (ref 70–99)
Glucose-Capillary: 122 mg/dL — ABNORMAL HIGH (ref 70–99)
Glucose-Capillary: 115 mg/dL — ABNORMAL HIGH (ref 70–99)
Glucose-Capillary: 197 mg/dL — ABNORMAL HIGH (ref 70–99)
Glucose-Capillary: 169 mg/dL — ABNORMAL HIGH (ref 70–99)

## 2011-09-17 MED ORDER — LEVETIRACETAM 500 MG PO TABS
500.0000 mg | ORAL_TABLET | Freq: Two times a day (BID) | ORAL | Status: DC
  Administered 2011-09-17 – 2011-09-19 (×4): 500 mg via ORAL
  Filled 2011-09-17 (×6): qty 1

## 2011-09-17 MED ORDER — PANTOPRAZOLE SODIUM 40 MG PO TBEC
40.0000 mg | DELAYED_RELEASE_TABLET | Freq: Every day | ORAL | Status: DC
  Administered 2011-09-17 – 2011-09-19 (×3): 40 mg via ORAL
  Filled 2011-09-17 (×2): qty 1
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
## 2011-09-17 NOTE — Progress Notes (Signed)
Patient's peripheral IV was malpositioned during assessment. Removed and no IV at this time. Patient tolerating .P.O's well at this time. Request not to start new site.

## 2011-09-17 NOTE — Progress Notes (Signed)
Patient ID: Haley Lowe, female   DOB: [birth year], 56 y.o.   MRN: [id number] Vital signs are stable he had pain is decreasing. Dressing has been removed incision is clean and dry. Patient will be transferred to the floor for mobilization. May be ready for discharge in.

## 2011-09-17 NOTE — Progress Notes (Signed)
Patient ID: Haley Lowe, female   DOB: October 07, 1955, 56 y.o.   MRN: [id number] Vital signs are stable however patient continues to have difficulty voiding with urinary retention. Foley catheter is been replaced. He seemed to do better with Flomax but still had essential difficulty voiding. Will increase dose of Flomax to 0.8 mg. Patient has Foley at present time but if able elected discontinued Foley in a.m. if patient can void discharge home thereafter

## 2011-09-18 LAB — GLUCOSE, CAPILLARY
Glucose-Capillary: 106 mg/dL — ABNORMAL HIGH (ref 70–99)
Glucose-Capillary: 114 mg/dL — ABNORMAL HIGH (ref 70–99)
Glucose-Capillary: 124 mg/dL — ABNORMAL HIGH (ref 70–99)
Glucose-Capillary: 184 mg/dL — ABNORMAL HIGH (ref 70–99)
Glucose-Capillary: 200 mg/dL — ABNORMAL HIGH (ref 70–99)
Glucose-Capillary: 87 mg/dL (ref 70–99)
Glucose-Capillary: 94 mg/dL (ref 70–99)

## 2011-09-18 MED ORDER — DEXAMETHASONE 4 MG PO TABS
4.0000 mg | ORAL_TABLET | Freq: Two times a day (BID) | ORAL | Status: DC
  Administered 2011-09-18 – 2011-09-19 (×3): 4 mg via ORAL
  Filled 2011-09-18 (×4): qty 1

## 2011-09-18 MED ORDER — CLONIDINE HCL 0.1 MG PO TABS
0.1000 mg | ORAL_TABLET | Freq: Every day | ORAL | Status: DC
  Administered 2011-09-18 – 2011-09-19 (×2): 0.1 mg via ORAL
  Filled 2011-09-18 (×2): qty 1
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
## 2011-09-19 LAB — CULTURE, ROUTINE-ABSCESS: Culture: NO GROWTH

## 2011-09-19 LAB — GLUCOSE, CAPILLARY
Glucose-Capillary: 114 mg/dL — ABNORMAL HIGH (ref 70–99)
Glucose-Capillary: 122 mg/dL — ABNORMAL HIGH (ref 70–99)
Glucose-Capillary: 149 mg/dL — ABNORMAL HIGH (ref 70–99)

## 2011-09-19 MED ORDER — HYDROCODONE-ACETAMINOPHEN 5-325 MG PO TABS: 1.0000 | ORAL_TABLET | ORAL | Status: AC | PRN

## 2011-09-19 MED ORDER — DEXAMETHASONE 4 MG PO TABS: 2.0000 mg | ORAL_TABLET | Freq: Two times a day (BID) | ORAL | Status: AC

## 2011-09-19 NOTE — Discharge Summary (Signed)
  Physician Discharge Summary  Patient ID: Haley Lowe MRN: [id number] DOB/AGE: 09-Jun-1955 56 y.o.  Admit date: 09/15/2011 Discharge date: 09/19/2011  Admission Diagnoses: Brain tumor  Discharge Diagnoses: Same Active Problems:  * No active hospital problems. *    Discharged Condition: good  Hospital Course: Patient was in the hospital and a 12 underwent left-sided craniotomy for evacuation of brain tumor postoperatively patient did very well in the floor on the floor spell was in well and living and voiding spontaneously tolerating her diet by postop day 4 still be discharged home in a weaning dose of Decadron. Patient be discharged oral pain medication with followup abdomen on Thursday for staple removal.  Consults: Significant Diagnostic Studies: Treatments: Craniotomy Discharge Exam: Blood pressure 141/87, pulse 60, temperature 97.5 F (36.4 C), temperature source Oral, resp. rate 20, height 5\' 3"  (1.6 m), weight 83 kg (182 lb 15.7 oz), SpO2 97.00%. Intact  Disposition: Home   Medication List  As of 09/19/2011  8:59 AM   TAKE these medications         ALPRAZolam 0.5 MG tablet   Commonly known as: XANAX   Take 0.25-0.5 mg by mouth 3 (three) times daily as needed. For anxiety      aspirin EC 81 MG tablet   Take 81 mg by mouth daily.      dexamethasone 4 MG tablet   Commonly known as: DECADRON   Take 0.5 tablets (2 mg total) by mouth every 12 (twelve) hours.      dexamethasone 4 MG tablet   Commonly known as: DECADRON   Take 4 mg by mouth 3 (three) times daily.      famotidine 20 MG tablet   Commonly known as: PEPCID   Take 20 mg by mouth 2 (two) times daily.      HYDROcodone-acetaminophen 5-325 MG per tablet   Commonly known as: NORCO   Take 1-2 tablets by mouth every 4 (four) hours as needed.      ibuprofen 800 MG tablet   Commonly known as: ADVIL,MOTRIN   Take 800 mg by mouth 3 (three) times daily as needed. For pain.      levETIRAcetam 500 MG tablet   Commonly known as: KEPPRA   Take 500 mg by mouth 2 (two) times daily.      metoprolol tartrate 25 MG tablet   Commonly known as: LOPRESSOR   Take 25 mg by mouth 2 (two) times daily.      olmesartan-hydrochlorothiazide 20-12.5 MG per tablet   Commonly known as: BENICAR HCT   Take 0.25 tablets by mouth daily.      omega-3 acid ethyl esters 1 G capsule   Commonly known as: LOVAZA   Take 1 g by mouth 3 (three) times daily.      simvastatin 10 MG tablet   Commonly known as: ZOCOR   Take 10 mg by mouth at bedtime.      Vitamin D 2000 UNITS Caps   Take 2,000 Units by mouth daily.             Signed: Hannibal Skalla P 09/19/2011, 8:59 AM

## 2011-09-19 NOTE — Progress Notes (Signed)
Pt discharged to home with Daughter. All follow up appointments and medications reviewed All questions clarified. Marc Morgans, RN

## 2011-09-19 NOTE — Progress Notes (Signed)
Patient ID: Haley Lowe, female   DOB: 12-May-1955, 56 y.o.   MRN: [id number] Patient is doing very well minimal headache inability voiding spontaneously tolerating a diet stable and be discharged home.

## 2011-09-19 NOTE — Discharge Instructions (Signed)
No lifting no bending no twisting no driving a riding a car keep your incision clean and dry

## 2011-09-20 LAB — ANAEROBIC CULTURE

## 2011-09-20 NOTE — Care Management Note (Signed)
    Page 1 of 1   09/20/2011     9:03:34 AM   CARE MANAGEMENT NOTE 09/20/2011  Patient:  Haley Lowe, Haley Lowe   Account Number:  [id number]  Date Initiated:  09/16/2011  Documentation initiated by:  [facility]  Subjective/Objective Assessment:   Admitted  postop crainiotomy for a brain tumor. Lives with spouse.     Action/Plan:   Anticipated DC Date:  09/19/2011   Anticipated DC Plan:  HOME/SELF CARE      DC Planning Services  CM consult      Choice offered to / List presented to:             Status of service:  Completed, signed off Medicare Important Message given?   (If response is "NO", the following Medicare IM given date fields will be blank) Date Medicare IM given:   Date Additional Medicare IM given:    Discharge Disposition:  HOME/SELF CARE  Per UR Regulation:  Reviewed for med. necessity/level of care/duration of stay  If discussed at Long Length of Stay Meetings, dates discussed:    Comments:

## 2011-09-23 ENCOUNTER — Other Ambulatory Visit: Payer: Self-pay | Admitting: Oncology

## 2011-09-23 ENCOUNTER — Other Ambulatory Visit: Payer: Self-pay | Admitting: Radiation Therapy

## 2011-09-24 ENCOUNTER — Telehealth: Payer: Self-pay | Admitting: *Deleted

## 2011-09-24 ENCOUNTER — Other Ambulatory Visit: Payer: Self-pay | Admitting: Neurosurgery

## 2011-09-24 ENCOUNTER — Other Ambulatory Visit (HOSPITAL_COMMUNITY): Payer: Self-pay | Admitting: Neurosurgery

## 2011-09-24 DIAGNOSIS — C719 Malignant neoplasm of brain, unspecified: Secondary | ICD-10-CM

## 2011-09-24 DIAGNOSIS — G8190 Hemiplegia, unspecified affecting unspecified side: Secondary | ICD-10-CM

## 2011-09-24 NOTE — Telephone Encounter (Signed)
S/w the pt's daughter Haley Lowe and she is aware of the new pt appt with dr Welton Flakes for 09/28/2011@1 :30pm

## 2011-09-27 ENCOUNTER — Telehealth: Payer: Self-pay | Admitting: Oncology

## 2011-09-27 NOTE — Telephone Encounter (Signed)
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

## 2011-09-27 NOTE — Telephone Encounter (Signed)
S/w the pt and she is aware of the new pt appt with dr Welton Flakes on 09/28/2011

## 2011-09-28 ENCOUNTER — Ambulatory Visit: Payer: BC Managed Care – PPO

## 2011-09-28 ENCOUNTER — Telehealth: Payer: Self-pay | Admitting: Oncology

## 2011-09-28 ENCOUNTER — Other Ambulatory Visit (HOSPITAL_BASED_OUTPATIENT_CLINIC_OR_DEPARTMENT_OTHER): Payer: BC Managed Care – PPO | Admitting: Lab

## 2011-09-28 ENCOUNTER — Ambulatory Visit (HOSPITAL_BASED_OUTPATIENT_CLINIC_OR_DEPARTMENT_OTHER): Payer: BC Managed Care – PPO | Admitting: Oncology

## 2011-09-28 ENCOUNTER — Encounter: Payer: Self-pay | Admitting: Oncology

## 2011-09-28 VITALS — BP 109/67 | HR 66 | Temp 98.7°F | Ht 61.0 in | Wt 166.7 lb

## 2011-09-28 DIAGNOSIS — C801 Malignant (primary) neoplasm, unspecified: Secondary | ICD-10-CM

## 2011-09-28 DIAGNOSIS — C7949 Secondary malignant neoplasm of other parts of nervous system: Secondary | ICD-10-CM

## 2011-09-28 DIAGNOSIS — E119 Type 2 diabetes mellitus without complications: Secondary | ICD-10-CM

## 2011-09-28 LAB — CBC WITH DIFFERENTIAL/PLATELET
BASO%: 0.3 % (ref 0.0–2.0)
Eosinophils Absolute: 0 10*3/uL (ref 0.0–0.5)
MCHC: 32.7 g/dL (ref 31.5–36.0)
MCV: 89.2 fL (ref 79.5–101.0)
MONO%: 7.3 % (ref 0.0–14.0)
NEUT#: 11.6 10*3/uL — ABNORMAL HIGH (ref 1.5–6.5)
RBC: 5.03 10*6/uL (ref 3.70–5.45)
RDW: 14.8 % — ABNORMAL HIGH (ref 11.2–14.5)
WBC: 14.3 10*3/uL — ABNORMAL HIGH (ref 3.9–10.3)

## 2011-09-28 LAB — COMPREHENSIVE METABOLIC PANEL
ALT: 15 U/L (ref 0–35)
AST: 10 U/L (ref 0–37)
Albumin: 4.1 g/dL (ref 3.5–5.2)
Alkaline Phosphatase: 54 U/L (ref 39–117)
Glucose, Bld: 103 mg/dL — ABNORMAL HIGH (ref 70–99)
Potassium: 4.1 mEq/L (ref 3.5–5.3)
Sodium: 135 mEq/L (ref 135–145)
Total Protein: 6.4 g/dL (ref 6.0–8.3)

## 2011-09-28 LAB — CANCER ANTIGEN 19-9: CA 19-9: 68.7 U/mL — ABNORMAL HIGH (ref <35.0)

## 2011-09-28 NOTE — Telephone Encounter (Signed)
gve the pt's dtr the June,july 2013 appt calendar along with the mammo/us and the appt to see dr Juanda Chance

## 2011-09-28 NOTE — Patient Instructions (Addendum)
1. Blood for tumor markers cea, ca19-9, ca 125, ca 27-29  2. Referral to Dr. [name] for GI work including endoscopy and colonoscopy  3. Mammogram and(possibly  Ultrasound)  4. Proceed with SRS next week  5. I will see you back in 3 -4 weeks time

## 2011-09-28 NOTE — Progress Notes (Signed)
[facility]  [phone] Fax:([phone]  MEDICAL ONCOLOGY - INITIAL CONSULATION    Referral MD Dr. Shirlean Kelly Dr. Dorothy Puffer Dr. 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 Patient is undergoing workup  HPI: 56 year old female with medical history significant for hypertension and borderline diabetes and hyperlipidemia. On 09/06/2011 patient developed some weakness in the right arm with difficulty in buttoning. Because of this she saw her primary care doctor Dr. Sherwood Gambler he did some blood work on 65. She went on to then have a CT of the head performed on 09/09/2011. The CT showed a 1.9 cm round central low density lesion in the left posterior frontal region with surrounding vasogenic edema and no midline shift no second lesion no hydrocephalus or extra axial collection no acute infarct. Because of this patient was [facility]: And on 09/15/2011 she underwent a resection of this tumor in the frontal lobe. The pathology showed a metastatic adenocarcinoma. Immunophenotyping was nonspecific by however it was felt by immunophenotype a primary upper GI pancreas or pancreatic oh biliary differential should be considered and least likely would be lung. Patient is a smoker. Patient has been seen by Dr. Shirlean Kelly who is planning on doing SRS on the patient. She is seen in medical oncology for discussion of evaluation and management.  Patient is accompanied by her husband as well as her daughter. Her daughter does work in a [employer], trade [employer]. Clinically patient seems to be doing much better she has recovered the use of her right arm. She is currently denying any nausea vomiting she has no headaches no double vision or blurring of vision no difficulty in ambulation. Remainder of the 14 point review of systems is unremarkable.   Past Medical History  Diagnosis Date  .  Hypertension   . PONV (postoperative nausea and vomiting)   . Goiter   . Anxiety   . Depression   . COPD (chronic obstructive pulmonary disease)   . GERD (gastroesophageal reflux disease)   . Headache   . Arthritis   . Fibromyalgia   . Adenocarcinoma 09/28/2011  :  Past Surgical History  Procedure Date  . Abdominal hysterectomy   . Cholecystectomy   . Shoulder surgery     right  . Eye surgery   . Foot surgery     left  . Craniotomy 09/15/2011    Procedure: CRANIOTOMY TUMOR EXCISION;  Surgeon: [name], MD;  Location: [facility];  Service: Neurosurgery;  Laterality: N/A;  Craniotomy resection of tumor with stealth  :  Current Outpatient Prescriptions  Medication Sig Dispense Refill  . ALPRAZolam (XANAX) 0.5 MG tablet Take 0.5 mg by mouth 4 (four) times daily as needed. For anxiety 0.5 1-2 tablets four times daily      . dexamethasone (DECADRON) 4 MG tablet Take 4 mg by mouth 3 (three) times daily.      Marland Kitchen dexamethasone (DECADRON) 4 MG tablet Take 0.5 tablets (2 mg total) by mouth every 12 (twelve) hours.  30 tablet  0  . famotidine (PEPCID) 20 MG tablet Take 20 mg by mouth 2 (two) times daily.      Marland Kitchen HYDROcodone-acetaminophen (NORCO) 5-325 MG per tablet Take 1-2 tablets by mouth every 4 (four) hours as needed.  80 tablet  1  . ibuprofen (ADVIL,MOTRIN) 800 MG tablet Take 800 mg by mouth 3 (three) times daily as needed. For pain.      Marland Kitchen  levETIRAcetam (KEPPRA) 500 MG tablet Take 500 mg by mouth 2 (two) times daily.      Marland Kitchen levofloxacin (LEVAQUIN) 250 MG tablet Take 500 mg by mouth daily.       . metoprolol tartrate (LOPRESSOR) 25 MG tablet Take 25 mg by mouth 2 (two) times daily.      Marland Kitchen olmesartan-hydrochlorothiazide (BENICAR HCT) 20-12.5 MG per tablet Take 0.25 tablets by mouth daily.      . simvastatin (ZOCOR) 10 MG tablet Take 10 mg by mouth at bedtime.      Marland Kitchen aspirin EC 81 MG tablet Take 81 mg by mouth daily.      . mirtazapine (REMERON) 15 MG tablet Take 15 mg by  mouth at bedtime.      Marland Kitchen omega-3 acid ethyl esters (LOVAZA) 1 G capsule Take 1 g by mouth 3 (three) times daily.         Allergies  Allergen Reactions  . Penicillins Swelling    " my brain swelled, and mymouth"  :  History reviewed. No pertinent family history.:  History   Social History  . Marital Status: Married    Spouse Name: N/A    Number of Children: N/A  . Years of Education: N/A   Occupational History  . Not on file.   Social History Main Topics  . Smoking status: Current Some Day Smoker -- 0.2 packs/day for 43 years    Types: Cigarettes  . Smokeless tobacco: Not on file  . Alcohol Use: Yes     ocass. wine  . Drug Use: No  . Sexually Active: Yes   Other Topics Concern  . Not on file   Social History Narrative  . No narrative on file  :  Pertinent items are noted in HPI.  Exam: Filed Vitals:   09/28/11 1353  BP: 109/67  Pulse: 66  Temp: 98.7 F (37.1 C)   General:  well-nourished in no acute distress.  Eyes:  no scleral icterus.  ENT:  There were no oropharyngeal lesions.  Neck was without thyromegaly.  Lymphatics:  Negative cervical, supraclavicular or axillary adenopathy.  Respiratory: lungs were clear bilaterally without wheezing or crackles.  Cardiovascular:  Regular rate and rhythm, S1/S2, without murmur, rub or gallop.  There was no pedal edema.  GI:  abdomen was soft, flat, nontender, nondistended, without organomegaly.  Muscoloskeletal:  no spinal tenderness of palpation of vertebral spine.  Skin exam was without echymosis, petichae.  Neuro exam was nonfocal.  Patient was able to get on and off exam table without assistance.  Gait was normal.  Patient was alerted and oriented.  Attention was good.   Language was appropriate.  Mood was normal without depression.  Speech was not pressured.  Thought content was not tangential.     Lab Results  Component Value Date   WBC 14.3* 09/28/2011   HGB 14.7 09/28/2011   HCT 44.9 09/28/2011   PLT 267 09/28/2011     GLUCOSE 143* 09/16/2011   NA 141 09/16/2011   K 4.0 09/16/2011   CL 107 09/16/2011   CREATININE 0.45* 09/16/2011   BUN 14 09/16/2011   CO2 23 09/16/2011    Ct Head W Contrast  09/13/2011  *RADIOLOGY REPORT*  Clinical Data: Stealth protocol.  Right arm and hand weakness. Dizziness.  Left-sided brain lesion.  CT HEAD WITH CONTRAST  Technique:  Contiguous axial images were obtained from the base of the skull through the vertex with intravenous contrast  Contrast: OMNIPAQUE IOHEXOL 300  MG/ML  SOLN  Comparison: MRI 09/09/2011  Findings: 1.9 cm in diameter round central low density lesion remains evident in the left posterior frontal region with surrounding vasogenic edema.  No midline shift.  No second lesion is identified.  No hydrocephalus or extra-axial collection.  No acute infarction.  IMPRESSION: 1.9 cm centrally necrotic lesion of the left posterior frontal region with surrounding vasogenic edema.  Stealth protocol. Metastatic cancer most likely diagnosis.  Primary cancer and atypical brain abscesses remain diagnostic considerations.  Original Report Authenticated By: Thomasenia Sales, M.D.   Ct Chest W Contrast  09/09/2011  **ADDENDUM** CREATED: 09/09/2011 18:37:15  Incorrect header was inserted in the initial report. No "CT HEAD" exam was performed. The study performed was: "CT CHEST/ABDOMEN/PELVIS WITH CONTRAST".  CT ABDOMEN/PELVIS WITH CONTRAST as previously reported. CT CHEST WITH CONTRAST correctly reported below.  CT CHEST WITH CONTRAST:  Findings:  Scattered atherosclerotic calcifications including a combination of calcified plaque and thrombus narrowing the proximal left subclavian artery images 13-16 Calcification noted at the origin of the left main coronary artery. Minimally enlarged right hilar lymph node 16 x 11 mm image 24. No additional thoracic adenopathy. The tiny bilateral pulmonary nodules up to 4 mm diameter, nonspecific, metastases not excluded.  No dominant pulmonary mass,  infiltrate, pleural effusion. Underlying emphysematous changes primarily in upper lobes. Questionable wall thickening of distal thoracic esophagus versus artifact from underdistension. No acute osseous findings.  IMPRESSION:  Tiny bilateral pulmonary nodules up to 4 mm in diameter, nonspecific, unable to exclude metastatic disease. No dominant pulmonary mass identified. Narrowing of proximal left subclavian artery. Questionable wall thickening distal thoracic esophagus versus artifact from underdistension, consider upper endoscopy or esophagram assessment.  **END ADDENDUM** SIGNED BY: Loraine Leriche A. Tyron Russell, M.D.   09/09/2011  *RADIOLOGY REPORT*  Clinical Data:  Left frontal lobe lesion on MRI brain with surrounding vasogenic edema suspicious for malignancy, history hypertension, no history of cancer.  Loss of use of right arm 1 month, history smoking  CT HEAD WITHOUT CONTRAST CT ABDOMEN AND PELVIS WITH CONTRAST  Technique:  Multidetector CT imaging of the head was performed following the standard protocol without intravenous contrast. Multidetector CT imaging of the abdomen and pelvis was performed following the standard protocol during bolus administration of intravenous contrast.  Contrast: OMNIPAQUE IOHEXOL 300 MG/ML SOLN. Dilute oral contrast.  Comparison:  None  CT HEAD  Findings: Scattered atherosclerotic calcifications including a combination of calcified plaque and thrombus narrowing the proximal left subclavian artery images 13-16 Calcification noted at the origin of the left main coronary artery. Minimally enlarged right hilar lymph node 16 x 11 mm image 24. No additional thoracic adenopathy. The tiny bilateral pulmonary nodules up to 4 mm diameter, nonspecific, metastasis not excluded. No dominant pulmonary mass, infiltrate, pleural effusion. Underlying emphysematous changes primarily in upper lobes. Questionable wall thickening of distal thoracic esophagus versus artifact from underdistension. No acute  osseous findings.  IMPRESSION: Tiny bilateral pulmonary nodules up to 4 mm in diameter, nonspecific, unable to exclude metastatic disease. No dominant pulmonary mass identified. Narrowing of proximal left subclavian artery. Questionable wall thickening distal thoracic esophagus versus artifact underdistension, consider upper endoscopy or esophagram assessment.  CT ABDOMEN AND PELVIS  Findings: Probable splenule inferior to spleen 2.0 cm diameter. Gallbladder surgically absent. 10 mm cyst at caudate lobe liver. Probable small focus of fatty infiltration of liver adjacent the falciform fissure. Questionable peripheral low attenuation foci within the liver laterally image 48 and posteriorly image 50. Liver, spleen, pancreas, kidneys, and  adrenal glands normal appearance.  Uterus surgically absent with normal sized ovaries and nonvisualization of appendix. Stomach and bowel loops otherwise normal appearance. Scattered atherosclerotic calcifications with slight fusiform dilatation of the infrarenal abdominal aorta with greatest diameter to 0.6 cm. No mass, adenopathy, free fluid or inflammatory process. No acute osseous findings.  IMPRESSION: No definite intra abdominal tumor or metastatic lesions identified. Tiny nonspecific low attenuation foci within liver. Original Report Authenticated By: Lollie Marrow, M.D.   Mr Laqueta Jean Wo Contrast  09/10/2011  **ADDENDUM** CREATED: 09/10/2011 09:04:05  Exam reviewed by telephone with Dr. Sherwood Gambler.  Additional finding of a 7 mm diameter area of vague increased attenuation in the left lower lobe is identified on image 34.  This is nonspecific and could represent an area of minimal infiltrate, scarring, or a subtle nonsolid opacity, recommendation below. Initial follow-up by chest CT without contrast is recommended in 3 months to confirm persistence.   This recommendation follows the consensus statement: Recommendations for the Management of Subsolid Pulmonary Nodules Detected at CT:  A  Statement from the Fleischner Society as published in Radiology 2013; 266:304-317.  **END ADDENDUM** SIGNED BY: Loraine Leriche A. Tyron Russell, M.D.   PATHOLOGY:  FINAL DIAGNOSIS Diagnosis Brain, for tumor resection, Frontal - METASTATIC ADENOCARCINOMA, SEE COMMENT. Microscopic Comment The adenocarcinoma demonstrates the following immunophenotype: Cytokeratin 7 - strong diffuse expression. Cytokeratin AE1/AE3 - strong diffuse expression. Cytokeratin 20 - negative expression. CDX-2 - negative expression. TTF-1 - negative expression. Napsin A - negative expression. Estrogen receptor - negative expression. GFAP - negative expression. GCDFP - negative expression. Mucicarmine - patchy strong expression. Although by morphology and mucicarmine stain expression, the tumor is diagnostic of metastatic adenocarcinoma, the immunophenotype is nonspecific. By immunophenotype, a primary upper gastrointestinal, pancreaticobiliary and, least likely lung are the differential considerations. The case was reviewed with Dr. Luisa Finelli, who concurs.  Assessment and Plan: 56 year old female with metastatic adenocarcinoma presenting with a brain metastases. Patient is status post resection. The differential most likely includes and a primary upper GI malignancy versus a pancreaticobiliary malignancy. However since patient is a smoker this certainly could be a lung cancer as well as the possibility of this being a Estrogen receptor negative breast cancer.  The family and I had a extensive discussion. Thus far I have gone ahead and set her up for blood work including doing tumor markers CEA  CA 19-9, CA 27-29, CA 125 to rule out: Pancreatic breast and ovarian malignancy. We would also like to have the patient get a GI consultation with Dr. Bonnye Fava. I do think that she will need an upper endoscopy as well as a colonoscopy. Patient should also get a mammogram performed which is set up for her. Patient does have a PET scan set up to  look for any evidence of primary malignancy.Patient will proceed with her SRS and I will plan on seeing her back in a few weeks time.  Drue Second, MD Medical/Oncology [facility] [phone] (beeper) [phone] (Office)

## 2011-09-29 ENCOUNTER — Encounter: Payer: Self-pay | Admitting: *Deleted

## 2011-09-30 ENCOUNTER — Encounter (HOSPITAL_COMMUNITY)
Admission: RE | Admit: 2011-09-30 | Discharge: 2011-09-30 | Disposition: A | Discharge status: Home / Self Care | Payer: BC Managed Care – PPO | Source: Ambulatory Visit | Attending: Neurosurgery | Admitting: Neurosurgery

## 2011-09-30 ENCOUNTER — Encounter: Payer: Self-pay | Admitting: Radiation Oncology

## 2011-09-30 DIAGNOSIS — R918 Other nonspecific abnormal finding of lung field: Secondary | ICD-10-CM | POA: Insufficient documentation

## 2011-09-30 DIAGNOSIS — C719 Malignant neoplasm of brain, unspecified: Secondary | ICD-10-CM

## 2011-09-30 DIAGNOSIS — C7931 Secondary malignant neoplasm of brain: Secondary | ICD-10-CM | POA: Insufficient documentation

## 2011-09-30 DIAGNOSIS — C7949 Secondary malignant neoplasm of other parts of nervous system: Secondary | ICD-10-CM | POA: Insufficient documentation

## 2011-09-30 DIAGNOSIS — R911 Solitary pulmonary nodule: Secondary | ICD-10-CM | POA: Insufficient documentation

## 2011-09-30 DIAGNOSIS — G8190 Hemiplegia, unspecified affecting unspecified side: Secondary | ICD-10-CM | POA: Insufficient documentation

## 2011-09-30 DIAGNOSIS — C801 Malignant (primary) neoplasm, unspecified: Secondary | ICD-10-CM | POA: Insufficient documentation

## 2011-09-30 DIAGNOSIS — C73 Malignant neoplasm of thyroid gland: Secondary | ICD-10-CM | POA: Insufficient documentation

## 2011-09-30 LAB — GLUCOSE, CAPILLARY: Glucose-Capillary: 92 mg/dL (ref 70–99)

## 2011-09-30 MED ORDER — FLUDEOXYGLUCOSE F - 18 (FDG) INJECTION
17.1000 | Freq: Once | INTRAVENOUS | Status: AC | PRN
  Administered 2011-09-30: 17.1 via INTRAVENOUS

## 2011-10-01 ENCOUNTER — Ambulatory Visit
Admission: RE | Admit: 2011-10-01 | Discharge: 2011-10-01 | Disposition: A | Discharge status: Home / Self Care | Payer: BC Managed Care – PPO | Source: Ambulatory Visit | Attending: Neurosurgery | Admitting: Neurosurgery

## 2011-10-01 ENCOUNTER — Telehealth: Payer: Self-pay | Admitting: *Deleted

## 2011-10-01 DIAGNOSIS — G8190 Hemiplegia, unspecified affecting unspecified side: Secondary | ICD-10-CM

## 2011-10-01 DIAGNOSIS — C719 Malignant neoplasm of brain, unspecified: Secondary | ICD-10-CM

## 2011-10-01 MED ORDER — GADOBENATE DIMEGLUMINE 529 MG/ML IV SOLN
15.0000 mL | Freq: Once | INTRAVENOUS | Status: AC | PRN
  Administered 2011-10-01: 15 mL via INTRAVENOUS

## 2011-10-01 NOTE — Telephone Encounter (Addendum)
Pt called for labs results. Last visit on 6/25. Will review with MD

## 2011-10-04 ENCOUNTER — Encounter: Payer: Self-pay | Admitting: *Deleted

## 2011-10-04 ENCOUNTER — Ambulatory Visit
Admission: RE | Admit: 2011-10-04 | Discharge: 2011-10-04 | Disposition: A | Discharge status: Home / Self Care | Payer: BC Managed Care – PPO | Source: Ambulatory Visit | Attending: Radiation Oncology | Admitting: Radiation Oncology

## 2011-10-04 ENCOUNTER — Other Ambulatory Visit: Payer: Self-pay | Admitting: Radiation Oncology

## 2011-10-04 ENCOUNTER — Encounter: Payer: Self-pay | Admitting: Internal Medicine

## 2011-10-04 ENCOUNTER — Inpatient Hospital Stay: Admission: RE | Admit: 2011-10-04 | Payer: Self-pay | Source: Ambulatory Visit

## 2011-10-04 ENCOUNTER — Encounter: Payer: Self-pay | Admitting: Radiation Oncology

## 2011-10-04 VITALS — BP 110/76 | HR 70 | Temp 98.5°F | Resp 20 | Wt 169.0 lb

## 2011-10-04 DIAGNOSIS — C7931 Secondary malignant neoplasm of brain: Secondary | ICD-10-CM

## 2011-10-04 DIAGNOSIS — Z7982 Long term (current) use of aspirin: Secondary | ICD-10-CM | POA: Insufficient documentation

## 2011-10-04 DIAGNOSIS — E119 Type 2 diabetes mellitus without complications: Secondary | ICD-10-CM | POA: Insufficient documentation

## 2011-10-04 DIAGNOSIS — I10 Essential (primary) hypertension: Secondary | ICD-10-CM | POA: Insufficient documentation

## 2011-10-04 DIAGNOSIS — C801 Malignant (primary) neoplasm, unspecified: Secondary | ICD-10-CM | POA: Insufficient documentation

## 2011-10-04 DIAGNOSIS — M797 Fibromyalgia: Secondary | ICD-10-CM | POA: Insufficient documentation

## 2011-10-04 DIAGNOSIS — T7840XA Allergy, unspecified, initial encounter: Secondary | ICD-10-CM | POA: Insufficient documentation

## 2011-10-04 DIAGNOSIS — K219 Gastro-esophageal reflux disease without esophagitis: Secondary | ICD-10-CM | POA: Insufficient documentation

## 2011-10-04 DIAGNOSIS — J4489 Other specified chronic obstructive pulmonary disease: Secondary | ICD-10-CM | POA: Insufficient documentation

## 2011-10-04 DIAGNOSIS — J449 Chronic obstructive pulmonary disease, unspecified: Secondary | ICD-10-CM | POA: Insufficient documentation

## 2011-10-04 DIAGNOSIS — Z510 Encounter for antineoplastic radiation therapy: Secondary | ICD-10-CM | POA: Insufficient documentation

## 2011-10-04 DIAGNOSIS — C7949 Secondary malignant neoplasm of other parts of nervous system: Secondary | ICD-10-CM | POA: Insufficient documentation

## 2011-10-04 DIAGNOSIS — Z79899 Other long term (current) drug therapy: Secondary | ICD-10-CM | POA: Insufficient documentation

## 2011-10-04 NOTE — Progress Notes (Signed)
Please see the Nurse Progress Note in the MD Initial Consult Encounter for this patient. 

## 2011-10-04 NOTE — Progress Notes (Signed)
Path "09/16/11= Brain,left frontal tumor resection=MetastaticAdenocarcinoma  Alert,oriented x3,  Married, 4 children,5 step children, no c/o h/a, nausea, no thrush seen On decadron 4mg  tid ,stopping keppra and starting Depakote tomorrow Incision top of head,well healed, right weakness upper and lower extremity. No c/o pain  Allergies: Codeine=itching,PCN=brain swelling,

## 2011-10-04 NOTE — Progress Notes (Signed)
Path: 09/16/11=Brain ,frontal for tumor resection=Metastatic Adenocarcinoma, Left frontoparietal craniotomy and total resection cystic tumor left precentral gyrus   Married,Unemployed,4 children,5 stepchildren, alert,oriented x3 a little sleepy took xanax  Today  prior to coming here    On decadron 3mg  bid tomorrow will be 4mg  tid, on keppra in am and Depakote in evenings, tomorrow just on Depakote  No c/o headache, nausea,blurred vision, weakness right side upper and lower extremity, no c/o pain, no thrush noted on tongue, eating and drinking well    Allergies:PCN=Swelling

## 2011-10-05 ENCOUNTER — Ambulatory Visit
Admission: RE | Admit: 2011-10-05 | Discharge: 2011-10-05 | Disposition: A | Discharge status: Home / Self Care | Payer: BC Managed Care – PPO | Source: Ambulatory Visit | Attending: Oncology | Admitting: Oncology

## 2011-10-05 ENCOUNTER — Other Ambulatory Visit: Payer: Self-pay | Admitting: Oncology

## 2011-10-05 ENCOUNTER — Telehealth: Payer: Self-pay | Admitting: *Deleted

## 2011-10-05 ENCOUNTER — Telehealth: Payer: Self-pay | Admitting: Radiation Oncology

## 2011-10-05 DIAGNOSIS — C801 Malignant (primary) neoplasm, unspecified: Secondary | ICD-10-CM

## 2011-10-05 DIAGNOSIS — R918 Other nonspecific abnormal finding of lung field: Secondary | ICD-10-CM

## 2011-10-05 DIAGNOSIS — C7931 Secondary malignant neoplasm of brain: Secondary | ICD-10-CM

## 2011-10-05 NOTE — Progress Notes (Signed)
[facility] Radiation Oncology NEW PATIENT EVALUATION  Name: Haley Lowe MRN: [id number]  Date:   10/04/2011           DOB: September 21, 1955  Status: outpatient   CC: [name], MD  [name], MD    REFERRING PHYSICIAN: [name], MD   DIAGNOSIS: The encounter diagnosis was Metastatic adenocarcinoma to brain.    HISTORY OF PRESENT ILLNESS:  Haley Lowe is a 56 y.o. female who is seen today for an initial consultation visit. The patient initially presented in early June 2013 with some weakness in the right arm/ hand. This lasted to some degree for approximately 3 weeks. The patient proceeded to undergo a CT scan of the head and this showed a 1.9 cm lesion within the left posterior frontal region with some surrounding vasogenic edema. No midline shift was present. This appeared to be a solitary finding. An MRI scan was completed and this showed a solitary posterior left frontal lobe lesion measuring 2.1 x 2.2 x 2.2 cm. There was again some surrounding vasogenic edema noted. This was felt to be suspicious for possible malignancy. No other intracranial lesions were seen. The patient was seen by Dr. Newell Coral who proceeded to perform a left frontoparietal craniotomy with resection of the tumor. Yellowish fluid was aspirated and the walls of the tumor cyst were noted to have abnormal tissue consistent with tumor and this was removed in a piecemeal fashion. Pathology from this revealed metastatic adenocarcinoma. The immunophenotype appeared consistent with possible primary upper GI tumor.  The patient has done well postoperatively. She does remain on Decadron at 2 tablets per day. Her strength in her right upper extremity/ right hand has improved as compared to prior to surgery. She notes, however, that she is not quite back at baseline all the time and she does have some occasional weakness in her right hand. The patient notes no other weakness or  difficulties.  The patient has undergone further workup including a PET scan. No clear primary tumor was identified. There is a possible subtle 7 mm lesion within the right upper lobe. There was also a possible area change within the liver but this did not appear most consistent with a metastasis. The patient has further GI workup pending. The patient was felt to be a good candidate for possible postoperative radiation treatment to the tumor site. She therefore has undergone an MRI scan of the brain with SRS protocol. This revealed a solitary postoperative left posterior frontal metastatic lesion with marked reduction in tumor size and surrounding vasogenic edema. Some peripheral enhancement was seen suggesting possible residual tumor. No other lesions were identified.  PREVIOUS RADIATION THERAPY: No   PAST MEDICAL HISTORY:  has a past medical history of Hypertension; PONV (postoperative nausea and vomiting); Anxiety; Depression; COPD (chronic obstructive pulmonary disease); GERD (gastroesophageal reflux disease); Headache; Arthritis; Adenocarcinoma (09/28/2011); adenomatous colonic polyps; Pulmonary nodules; Diabetes mellitus; Emphysema; Metastatic adenocarcinoma to brain (09/15/11); Allergy; Cataract; Asthma; Goiter; and Fibromyalgia.     PAST SURGICAL HISTORY:  Past Surgical History  Procedure Date  . Abdominal hysterectomy 1996  . Cholecystectomy 2000  . Shoulder surgery 1998    right  . Eye surgery 2001    vision correction  . Foot surgery 2005    left  . Craniotomy 09/15/2011    Procedure: CRANIOTOMY TUMOR EXCISION;  Surgeon: [name], MD;  Location: [facility];  Service: Neurosurgery;  Laterality: N/A;  Craniotomy resection of tumor with stealth  .  Cataract extraction 2011    with lens implant     FAMILY HISTORY: family history includes Aneurysm in an unspecified family member; Asthma in her mother; COPD in an unspecified family member; Colon cancer in an unspecified family  member; Diabetes in an unspecified family member; Heart attack in an unspecified family member; Kidney failure in her father; and Parkinsonism in an unspecified family member.   SOCIAL HISTORY:  reports that she has been smoking Cigarettes.  She has a 10.75 pack-year smoking history. She does not have any smokeless tobacco history on file. She reports that she drinks alcohol. She reports that she does not use illicit drugs.   ALLERGIES: Penicillins and Codeine   MEDICATIONS:  Current Outpatient Prescriptions  Medication Sig Dispense Refill  . ALPRAZolam (XANAX) 0.5 MG tablet Take 0.5 mg by mouth 4 (four) times daily as needed. For anxiety 0.5 1-2 tablets four times daily      . diphenhydrAMINE (SOMINEX) 25 MG tablet Take 25 mg by mouth as needed. For itching      . divalproex (DEPAKOTE) 500 MG DR tablet       . docusate sodium (COLACE) 100 MG capsule Take 100 mg by mouth as needed.      . famotidine (PEPCID) 20 MG tablet Take 20 mg by mouth 2 (two) times daily.      Marland Kitchen HYDROcodone-acetaminophen (NORCO) 5-325 MG per tablet Take 1 tablet by mouth every 4 (four) hours as needed.      Marland Kitchen ibuprofen (ADVIL,MOTRIN) 800 MG tablet Take 800 mg by mouth 3 (three) times daily as needed. For pain.      Marland Kitchen levETIRAcetam (KEPPRA) 500 MG tablet Take 500 mg by mouth 2 (two) times daily. Will stop after today 10/04/11 and go on only depakote      . levofloxacin (LEVAQUIN) 250 MG tablet Take 500 mg by mouth daily.       . metoprolol tartrate (LOPRESSOR) 25 MG tablet Take 25 mg by mouth 2 (two) times daily.      . mirtazapine (REMERON) 15 MG tablet Take 15 mg by mouth at bedtime.      Marland Kitchen olmesartan-hydrochlorothiazide (BENICAR HCT) 20-12.5 MG per tablet Take 0.25 tablets by mouth daily.      . Probiotic Product (PROBIOTIC DAILY) CAPS Take 1 capsule by mouth daily.      . Simethicone (GAS-X PO) Take 1 tablet by mouth as needed. 1-2 prn      . simvastatin (ZOCOR) 10 MG tablet Take 10 mg by mouth at bedtime.      Marland Kitchen  UNABLE TO FIND Apply 1 application topically as needed. Med Name: Zovarix      . aspirin EC 81 MG tablet Take 81 mg by mouth daily.      Marland Kitchen dexamethasone (DECADRON) 4 MG tablet Take 4 mg by mouth 3 (three) times daily.       Marland Kitchen omega-3 acid ethyl esters (LOVAZA) 1 G capsule Take 1 g by mouth 3 (three) times daily.         REVIEW OF SYSTEMS:  Negative other than as above in the history of present illness.    PHYSICAL EXAM:  weight is 169 lb (76.658 kg). Her oral temperature is 98.5 F (36.9 C). Her blood pressure is 110/76 and her pulse is 70. Her respiration is 20.   BP 110/76  Pulse 70  Temp 98.5 F (36.9 C) (Oral)  Resp 20  Wt 169 lb (76.658 kg)  General Appearance:  Alert, cooperative, no distress, appears stated age  Head:    well-healing craniotomy scar. No sign of infection.   Eyes:    PERRL, EOM's intact  Neck:   Supple, symmetrical, no adenopathy;    thyroid:  no enlargement/tenderness/nodules  Lungs:     Clear to auscultation bilaterally, respirations unlabored   Heart:    Regular rate and rhythm  Abdomen:     Soft, non-tender, bowel sounds active, no organomegaly  Extremities:   Extremities normal, atraumatic, no cyanosis or edema  Pulses:   2+ and symmetric   Skin:   Skin color normal  Lymph nodes:   Cervical, supraclavicular, and axillary nodes normal  Neurologic:   normal strength     LABORATORY DATA:  Lab Results  Component Value Date   WBC 14.3* 09/28/2011   HGB 14.7 09/28/2011   HCT 44.9 09/28/2011   MCV 89.2 09/28/2011   PLT 267 09/28/2011   Lab Results  Component Value Date   NA 135 09/28/2011   K 4.1 09/28/2011   CL 100 09/28/2011   CO2 26 09/28/2011   Lab Results  Component Value Date   ALT 15 09/28/2011   AST 10 09/28/2011   ALKPHOS 54 09/28/2011   BILITOT 0.4 09/28/2011     IMPRESSION: 57 year old female with metastatic adenocarcinoma with unknown primary given current workup. The patient is status post resection of a solitary metastasis within the  left motor strip.   PLAN: The patient is an appropriate candidate for a course of postoperative radiation to the resection cavity. No other lesions seen on brain MRI scan with SRS protocol. I discussed the rationale of such a treatment with the patient and her family in terms of improvement in local control. Such a treatment also has the advantage of minimizing radiation dose elsewhere within the brain. We discussed what this treatment would entail and we did discuss the possible side effects and risks of treatment including necrosis of the treatment area. The patient is currently on Decadron and she has been given information to continue this dose ultimately with a taper. All the patient's questions were answered.   The patient does wish to proceed with treatment. She therefore will proceed with a simulation later today such that we can begin treatment planning. Currently she is tentatively scheduled to receive a single fraction of radiosurgery on 10/06/2011. I would anticipate delivering 15 gray. This will be coordinated with Dr. Newell Coral. The patient knows to contact our office if she has any questions and I look forward to seeing her throughout this process.  I spent 60 minutes minutes face to face with the patient and more than 50% of that time was spent in counseling and/or coordination of care.

## 2011-10-05 NOTE — Telephone Encounter (Signed)
PLEASE CALL PT.'S DAUGHTER, SHERRY RICH, WITH HER MOTHER'S RESULTS. NOTE GIVEN TO DR.KHAN.

## 2011-10-05 NOTE — Telephone Encounter (Signed)
Sent RO billing information via mail.

## 2011-10-06 ENCOUNTER — Telehealth: Payer: Self-pay | Admitting: Medical Oncology

## 2011-10-06 ENCOUNTER — Telehealth: Payer: Self-pay | Admitting: Internal Medicine

## 2011-10-06 ENCOUNTER — Encounter: Payer: Self-pay | Admitting: Radiation Oncology

## 2011-10-06 ENCOUNTER — Ambulatory Visit
Admission: RE | Admit: 2011-10-06 | Discharge: 2011-10-06 | Disposition: A | Discharge status: Home / Self Care | Payer: BC Managed Care – PPO | Source: Ambulatory Visit | Attending: Radiation Oncology | Admitting: Radiation Oncology

## 2011-10-06 VITALS — BP 118/76 | HR 20 | Temp 98.3°F | Resp 57

## 2011-10-06 DIAGNOSIS — Z923 Personal history of irradiation: Secondary | ICD-10-CM

## 2011-10-06 DIAGNOSIS — C7931 Secondary malignant neoplasm of brain: Secondary | ICD-10-CM

## 2011-10-06 HISTORY — DX: Personal history of irradiation: Z92.3

## 2011-10-06 NOTE — Progress Notes (Signed)
Patient brought to nursing s/p SRS brain rad tx, seen Dr. Jonnie Finner at treatment machine,patient, room 5 alert,oriented x3, denies pain,nausea, or headacjhe, no dizziness stated by patient,eating well, vitals 98.3, 118/76,59,20, spouse and daughter at side,will recheck vitals at 130 and notify Dr.moody 1:10 PM

## 2011-10-06 NOTE — Telephone Encounter (Signed)
I spoke w/ Dr. Milta Deiters office who stated that it is okay for pt to be seen by VS instead of MR.  I called pt's daughter, Helyn App, and advised of the appt set w/ VS on 10/08/11 at 3:00 pm.    Cordelia Pen verbalized understanding & stated nothing else needed at this time.  Holly D Pryor  Havana D Pryor [place]

## 2011-10-06 NOTE — Telephone Encounter (Signed)
Received call from Crystal at [facility] regarding this patient.  Returned call and spoke to [place], per MD ok for patient to see Dr. Craige Cotta.

## 2011-10-06 NOTE — Progress Notes (Signed)
2nd set vitals taken, patient denys any pain,nausea,headache,dizziness, drank glass water , vitals=117/74,p=57,rr=20, patient states she's readu yo go home 1:30 PM

## 2011-10-06 NOTE — Telephone Encounter (Signed)
Called, spoke with pt's daughter, Cordelia Pen, who states they need an ASAP appt with MR for pt.  Advised MR will be out of the country starting the middle of next wk and has no openings before he leaves.  I offered Consult with Dr. Craige Cotta on this coming Friday, July 5 -- she was not sure if Dr. Welton Flakes would be ok with this stating Dr. Welton Flakes has already spoken with MR directly regarding pt.  Cordelia Pen states they are ok with pt seeing another provider besides MR as long as this is ok with Dr. Milta Deiters office.  Advised I would call Dr. Milta Deiters office to verify if this would be ok or not and would call her back.  She verbalized understanding of this.    I have called Dr. Milta Deiters office - lmom for nurse to call back.  Note:  I have held this slot on July 5 on Dr. Evlyn Courier schedule.  If for some reason, this is not ok, we will need to unblock this.

## 2011-10-08 ENCOUNTER — Encounter: Payer: Self-pay | Admitting: Internal Medicine

## 2011-10-08 ENCOUNTER — Ambulatory Visit (INDEPENDENT_AMBULATORY_CARE_PROVIDER_SITE_OTHER): Payer: BC Managed Care – PPO | Admitting: Pulmonary Disease

## 2011-10-08 ENCOUNTER — Encounter: Payer: Self-pay | Admitting: Pulmonary Disease

## 2011-10-08 ENCOUNTER — Ambulatory Visit (INDEPENDENT_AMBULATORY_CARE_PROVIDER_SITE_OTHER): Payer: BC Managed Care – PPO | Admitting: Internal Medicine

## 2011-10-08 VITALS — BP 110/72 | HR 78 | Temp 98.2°F | Ht 61.0 in | Wt 173.6 lb

## 2011-10-08 VITALS — BP 98/62 | HR 80 | Ht 61.0 in | Wt 173.2 lb

## 2011-10-08 DIAGNOSIS — R195 Other fecal abnormalities: Secondary | ICD-10-CM

## 2011-10-08 DIAGNOSIS — R9389 Abnormal findings on diagnostic imaging of other specified body structures: Secondary | ICD-10-CM

## 2011-10-08 DIAGNOSIS — R918 Other nonspecific abnormal finding of lung field: Secondary | ICD-10-CM

## 2011-10-08 MED ORDER — MOVIPREP 100 G PO SOLR: ORAL | Status: DC

## 2011-10-08 NOTE — Progress Notes (Signed)
Chief Complaint  Patient presents with  . Advice Only    Ref by Dr.Khan- Lung CA- slight prod cough w/white mucus, pt reports most symptoms are side effects from medicaiton she is on    History of Present Illness: Haley Lowe is a 56 y.o. female former smoker for evaluation of pulmonary nodule.  She developed progressive hand tremor.  As a result she had CT head and found to have Lt posterior frontal brain tumor.  This was resected by Dr. Newell Coral on September 15, 2011.  The lesion was consistent metastatic adenocarcinoma.  Immunophenotyping was nonspecific but it was felt by immunophenotype to be a primary upper GI, pancreatic or biliary source.  However, lung source also considered.  She had CT chest from September 09, 2011 which showed 16 x 11 mm Rt hilar node, and b/l 4 mm nodules.  PET scan from September 24, 2011 showed 7 mm Rt upper lobe nodule with borderline uptake.  She was then seen on September 28, 2011 by Dr. Welton Flakes with oncology.  She was referred to pulmonary to further assess whether she needs bronchoscopy to further assesses primary source for brain metastatic lesion.  She denies cough, wheeze, sputum, chest pain, fever, sweats, weight loss, or hemoptysis.   Past Medical History  Diagnosis Date  . Hypertension   . PONV (postoperative nausea and vomiting)   . Anxiety   . Depression   . COPD (chronic obstructive pulmonary disease)   . GERD (gastroesophageal reflux disease)   . Headache   . Arthritis   . Adenocarcinoma 09/28/2011  . Hx of adenomatous colonic polyps   . Pulmonary nodules   . Diabetes mellitus   . Emphysema   . 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 Allergy     pcn  . Cataract     1 lens implaqnt right eye,intact on left eye  . Asthma     hips,shoulder s  . Goiter     nodule watching  . Fibromyalgia     neuropathy tops of both feet  . Lung cancer 09/2011    Past Surgical History  Procedure Date  . Abdominal hysterectomy 1996  .  Cholecystectomy 2000  . Shoulder surgery 1998    right  . Eye surgery 2001    vision correction  . Foot surgery 2005    left  . Craniotomy 09/15/2011    Procedure: CRANIOTOMY TUMOR EXCISION;  Surgeon: [name], MD;  Location: [facility];  Service: Neurosurgery;  Laterality: N/A;  Craniotomy resection of tumor with stealth  . Cataract extraction 2011    with lens implant    Current Outpatient Prescriptions on File Prior to Visit  Medication Sig Dispense Refill  . ALPRAZolam (XANAX) 0.5 MG tablet Take 0.5 mg by mouth 4 (four) times daily as needed. For anxiety 0.5 1-2 tablets four times daily      . divalproex (DEPAKOTE) 500 MG DR tablet 500 mg 2 (two) times daily.       . famotidine (PEPCID) 20 MG tablet Take 20 mg by mouth 2 (two) times daily.      Marland Kitchen FLUCONAZOLE PO Take 1 tablet by mouth. x7 days      . HYDROcodone-acetaminophen (NORCO) 5-325 MG per tablet Take 1 tablet by mouth every 4 (four) hours as needed.      Marland Kitchen ibuprofen (ADVIL,MOTRIN) 800 MG tablet Take 800 mg by mouth 3 (three) times daily as needed. For pain.      Marland Kitchen  metoprolol tartrate (LOPRESSOR) 25 MG tablet Take 25 mg by mouth 2 (two) times daily.      . mirtazapine (REMERON) 15 MG tablet Take 4 mg by mouth at bedtime.       Marland Kitchen nystatin (MYCOSTATIN) 100000 UNIT/ML suspension Take 500,000 Units by mouth 4 (four) times daily.      Marland Kitchen olmesartan-hydrochlorothiazide (BENICAR HCT) 20-12.5 MG per tablet Take 0.25 tablets by mouth daily.      . simvastatin (ZOCOR) 10 MG tablet Take 10 mg by mouth at bedtime.      Marland Kitchen UNABLE TO FIND Apply 1 application topically as needed. Med Name: Zovarix      . docusate sodium (COLACE) 100 MG capsule Take 100 mg by mouth as needed.      . Probiotic Product (PROBIOTIC DAILY) CAPS Take 1 capsule by mouth daily.      . Simethicone (GAS-X PO) Take 1 tablet by mouth as needed. 1-2 prn        Allergies  Allergen Reactions  . Penicillins Swelling    " my brain swelled, and mymouth"  .  Codeine Itching    Mild takes benadryl    Family History  Problem Relation Age of Onset  . Asthma Mother   . Kidney failure Father   . Diabetes Sister   . Heart attack Sister   . Colon cancer Brother   . COPD Sister   . Aneurysm Paternal Grandmother     brain  . Parkinsonism Maternal Uncle   . COPD Brother     History  Substance Use Topics  . Smoking status: Current Some Day Smoker -- 1.0 packs/day for 40 years    Types: Cigarettes  . Smokeless tobacco: Never Used  . Alcohol Use: Yes     ocass. wine     Physical Exam: Filed Vitals:   10/08/11 1508  BP: 110/72  Pulse: 78  Temp: 98.2 F (36.8 C)  TempSrc: Oral  Height: 5\' 1"  (1.549 m)  Weight: 173 lb 9.6 oz (78.744 kg)  SpO2: 95%  ,   Wt Readings from Last 3 Encounters:  10/08/11 173 lb 9.6 oz (78.744 kg)  10/08/11 173 lb 3.2 oz (78.563 kg)  10/04/11 169 lb (76.658 kg)   Body mass index is 32.80 kg/(m^2).  General - No distress ENT - Ears normal. Throat and pharynx normal. Neck supple. No adenopathy or masses in the neck or supraclavicular regions. Nasal septum midline, no deformities, nares patent, normal mucosa without swelling, no polyps, no bleeding. Paranasal sinuses are normal. No tenderness to the maxillary, frontal, ethmoids or mastoids. Cardiac - S1 and S2 normal, no murmurs, clicks, gallops or rubs. Regular rate and rhythm.  No edema or JVD. Chest - Chest is clear, no wheezing or rales. Normal symmetric air entry throughout both lung fields. No chest wall deformities or tenderness. Back - Cervical, thoracic and lumbar spine exam is normal without tenderness, masses or kyphoscoliosis. Full range of motion without pain is noted. Abd - soft without tenderness, guarding, mass, rebound or organomegaly. Bowel sounds are normal. No CVA tenderness noted. Ext - good pulses, motor strength and sensation. Neuro - Cranial nerves are normal. PERLA. EOM's intact.  Well healing craniotomy scar Skin - no discernible  active dermatitis, erythema, urticaria or inflammatory process. Psych - normal mood, and behavior.  Lab Results  Component Value Date   WBC 14.3* 09/28/2011   HGB 14.7 09/28/2011   HCT 44.9 09/28/2011   MCV 89.2 09/28/2011   PLT 267  09/28/2011   Lab Results  Component Value Date   CREATININE 0.68 09/28/2011   BUN 19 09/28/2011   NA 135 09/28/2011   K 4.1 09/28/2011   CL 100 09/28/2011   CO2 26 09/28/2011   Lab Results  Component Value Date   ALT 15 09/28/2011   AST 10 09/28/2011   ALKPHOS 54 09/28/2011   BILITOT 0.4 09/28/2011   Lab Results  Component Value Date   CA125 13.0 09/28/2011   Lab Results  Component Value Date   LABCA2 11 09/28/2011   CA 19-9 from 09/28/11>>68.7  Lab Results  Component Value Date   CEA 6.7 09/28/2011    Assessment/Plan:  Coralyn Helling, MD  Pulmonary/Critical Care/Sleep Pager:  [phone] 10/08/2011, 3:50 PM  Patient Instructions  Will schedule PFT Follow up in 2 weeks

## 2011-10-08 NOTE — Patient Instructions (Addendum)
Will schedule PFT Follow up in 2 weeks

## 2011-10-08 NOTE — Patient Instructions (Addendum)
You have been scheduled for an endoscopy and colonoscopy with propofol. Please follow the written instructions given to you at your visit today. Please pick up your prep at the pharmacy within the next 1-3 days. CC: Dr Elfredia Nevins

## 2011-10-08 NOTE — Progress Notes (Signed)
Haley Lowe 19-May-1955 MRN [id number]  History of Present Illness:  This is a 56 year old white female with metastatic adenocarcinoma to the brain with unknown primary source. Her PET scan was positive for a 7 mm lesion in the right upper lobe. A CT scan of the abdomen in June 2013 showed nonspecific thickening in the distal esophagus. She denies any dysphagia, odynophagia or heartburn. Her appetite has been good. She has occasional constipation. We have done 2 colonoscopies, one in 2005 and the last one in 2008, both with findings of polyps. One of them was adenomatous. She is due for a recall colonoscopy. She has elevated tumor markers, specifically a CEA of 6.7 and a CA 19-9 of 68.7. Her hemoglobin is normal at 14.7. She had a prior cholecystectomy and her liver function tests are normal.   Past Medical History  Diagnosis Date  . Hypertension   . PONV (postoperative nausea and vomiting)   . Anxiety   . Depression   . COPD (chronic obstructive pulmonary disease)   . GERD (gastroesophageal reflux disease)   . Headache   . Arthritis   . Adenocarcinoma 09/28/2011  . Hx of adenomatous colonic polyps   . Pulmonary nodules   . Diabetes mellitus   . Emphysema   . 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 Allergy     pcn  . Cataract     1 lens implaqnt right eye,intact on left eye  . Asthma     hips,shoulder s  . Goiter     nodule watching  . Fibromyalgia     neuropathy tops of both feet   Past Surgical History  Procedure Date  . Abdominal hysterectomy 1996  . Cholecystectomy 2000  . Shoulder surgery 1998    right  . Eye surgery 2001    vision correction  . Foot surgery 2005    left  . Craniotomy 09/15/2011    Procedure: CRANIOTOMY TUMOR EXCISION;  Surgeon: [name], MD;  Location: [facility];  Service: Neurosurgery;  Laterality: N/A;  Craniotomy resection of tumor with stealth  . Cataract extraction 2011    with lens implant    reports that she has been smoking Cigarettes.  She has a 10.75 pack-year smoking history. She does not have any smokeless tobacco history on file. She reports that she drinks alcohol. She reports that she does not use illicit drugs. family history includes Aneurysm in her paternal grandmother; Asthma in her mother; COPD in her brother and sister; Colon cancer in her brother; Diabetes in her sister; Heart attack in her sister; Kidney failure in her father; and Parkinsonism in her maternal uncle. Allergies  Allergen Reactions  . Penicillins Swelling    " my brain swelled, and mymouth"  . Codeine Itching    Mild takes benadryl        Review of Systems: Positive for constipation. Negative for abdominal pain dysphagia and aphagia or weight loss  The remainder of the 10 point ROS is negative except as outlined in H&P   Physical Exam: General appearance  Well developed, in no distress. Appears cushingoid Eyes- non icteric. HEENT nontraumatic, normocephalic. Voice the craniotomy scar Mouth no lesions, tongue papillated, no cheilosis. Neck supple without adenopathy, thyroid not enlarged, no carotid bruits, no JVD. Lungs Clear to auscultation bilaterally. Cor normal S1, normal S2, regular rhythm, no murmur,  quiet precordium. Abdomen: Protuberant but soft. No fluid wave. Normal active bowel sounds. Mild hepatomegaly with liver  edge at about 2 cm below right costal margin. Nontender. Rectal: Soft trace Hemoccult-positive stool. Extremities no pedal edema. Skin no lesions. Neurological alert and oriented x 3. Psychological normal mood and affect.  Assessment and Plan:  Problem #1 Metastatic adenocarcinoma with positive GI tumor markers. Unknown primary, although I suspect it is from the GI tract, such as the colon, pancreas or upper GI tract. She is essentially asymptomatic as far as the tumor location is concerned. She is trace Hemoccult-positive. We have set her up for an upper endoscopy and  colonoscopy for 10/13/2011. We have discussed the prep and the sedation.   10/08/2011 [name]

## 2011-10-08 NOTE — Progress Notes (Deleted)
Subjective:     Patient ID: Haley Lowe, female   DOB: 1955-10-29, 56 y.o.   MRN: [id number]  HPI   Review of Systems  Constitutional: Positive for fever, chills, diaphoresis, activity change, appetite change, fatigue and unexpected weight change.  HENT: Positive for congestion, sore throat, facial swelling, rhinorrhea, neck pain, postnasal drip and ear discharge. Negative for hearing loss, ear pain, nosebleeds, sneezing, mouth sores, trouble swallowing, neck stiffness, dental problem, voice change, sinus pressure and tinnitus.   Eyes: Negative for photophobia, discharge, itching and visual disturbance.  Respiratory: Positive for cough and wheezing. Negative for apnea, choking, chest tightness and stridor.   Cardiovascular: Positive for palpitations. Negative for chest pain and leg swelling.  Gastrointestinal: Positive for nausea, vomiting, diarrhea and constipation. Negative for abdominal pain, blood in stool and abdominal distention.  Genitourinary: Negative for dysuria, urgency, frequency, hematuria, flank pain, decreased urine volume and difficulty urinating.  Musculoskeletal: Negative for myalgias, back pain, joint swelling, arthralgias and gait problem.  Skin: Positive for rash. Negative for color change and pallor.  Neurological: Negative for dizziness, tremors, seizures, syncope, speech difficulty, weakness, light-headedness, numbness and headaches.  Hematological: Negative for adenopathy. Bruises/bleeds easily.  Psychiatric/Behavioral: Positive for confusion, disturbed wake/sleep cycle and agitation. The patient is nervous/anxious.        Objective:   Physical Exam     Assessment:     ***    Plan:     ***

## 2011-10-08 NOTE — Assessment & Plan Note (Signed)
She has 7 mm Rt upper lobe nodule with borderline uptake on PET scan.  I am not sure if this is lesion that would explain her brain metastatic lesion.  Her stains from brain lesion were more suggestive of GI primary source.  She has guiac positive stools, and is scheduled for colonoscopy with Dr. Juanda Chance July 10.    Will arrange for pulmonary function tests, and await colonoscopy tests.  Depending on results of these will determine if she needs bronchoscopy.  If she does, then best route would likely be electro-navigational bronchoscopy.  This would entail general anesthesia, and I have explained this to the patient and her family.

## 2011-10-09 ENCOUNTER — Encounter: Payer: Self-pay | Admitting: Internal Medicine

## 2011-10-13 ENCOUNTER — Encounter: Payer: Self-pay | Admitting: Internal Medicine

## 2011-10-13 ENCOUNTER — Ambulatory Visit (AMBULATORY_SURGERY_CENTER): Payer: BC Managed Care – PPO | Admitting: Internal Medicine

## 2011-10-13 VITALS — BP 116/67 | HR 61 | Temp 98.4°F | Resp 20 | Ht 61.0 in | Wt 173.0 lb

## 2011-10-13 DIAGNOSIS — C7931 Secondary malignant neoplasm of brain: Secondary | ICD-10-CM

## 2011-10-13 DIAGNOSIS — D126 Benign neoplasm of colon, unspecified: Secondary | ICD-10-CM

## 2011-10-13 DIAGNOSIS — R9389 Abnormal findings on diagnostic imaging of other specified body structures: Secondary | ICD-10-CM

## 2011-10-13 DIAGNOSIS — K2970 Gastritis, unspecified, without bleeding: Secondary | ICD-10-CM

## 2011-10-13 DIAGNOSIS — K2270 Barrett's esophagus without dysplasia: Secondary | ICD-10-CM

## 2011-10-13 DIAGNOSIS — C7949 Secondary malignant neoplasm of other parts of nervous system: Secondary | ICD-10-CM

## 2011-10-13 DIAGNOSIS — R195 Other fecal abnormalities: Secondary | ICD-10-CM

## 2011-10-13 DIAGNOSIS — C801 Malignant (primary) neoplasm, unspecified: Secondary | ICD-10-CM

## 2011-10-13 DIAGNOSIS — K319 Disease of stomach and duodenum, unspecified: Secondary | ICD-10-CM

## 2011-10-13 MED ORDER — SODIUM CHLORIDE 0.9 % IV SOLN: 500.0000 mL | INTRAVENOUS | Status: DC

## 2011-10-13 NOTE — Patient Instructions (Addendum)

## 2011-10-13 NOTE — Op Note (Signed)
[facility] [address] [place], [place]  [address]  ENDOSCOPY PROCEDURE REPORT  PATIENT:  Haley Lowe, Haley Lowe  MR#:  [id number] BIRTHDATE:  [birth year], 55 yrs. old  GENDER:  female  ENDOSCOPIST:  Hedwig Morton. Juanda Chance, MD Referred by:  Drue Second, M.D. Artis Delay, M.D.  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  The [facility] GIF-H180 [id number] endoscope was introduced through the mouth and advanced to the third portion of the duodenum, without limitations.  The instrument was slowly withdrawn as the mucosa was fully examined. <<PROCEDUREIMAGES>>  irregular Z-line. mild irregularity, no mass or stricture With standard forceps, a biopsy was obtained and sent to pathology (see image1, image8, image9, and image10).  inlet patch (see image12, image11, image13, and image14). 3 small inlet patch in proximal esophagus  Moderate gastritis was found. multiple specks of blood adherent to the gastric mucosa With standard forceps, a biopsy was obtained and sent to pathology (see image7, image4, image3, image2, and image1).  Otherwise the examination was normal (see image5 and image6).    Retroflexed views revealed no abnormalities.    The scope was then withdrawn from the patient and the procedure completed.  COMPLICATIONS:  None  ENDOSCOPIC IMPRESSION: 1) Irregular Z-line 2) Inlet patch 3) Moderate gastritis 4) Otherwise normal examination no evidence of carcinoma RECOMMENDATIONS: 1) Await biopsy results colonoscopy  REPEAT EXAM:  In 0  year(s) for.  ______________________________ Hedwig Morton. Juanda Chance, MD  CC:  n. eSIGNED:   Hedwig Morton. Vallory Oetken at 10/13/2011 04:27 PM  Rosalita Chessman, [id number]

## 2011-10-13 NOTE — Progress Notes (Signed)
Patient did not experience any of the following events: a burn prior to discharge; a fall within the facility; wrong site/side/patient/procedure/implant event; or a hospital transfer or hospital admission upon discharge from the facility. (G8907) Patient did not have preoperative order for IV antibiotic SSI prophylaxis. (G8918)  

## 2011-10-13 NOTE — Op Note (Signed)
[facility] [address] [place], [place]  [address]  COLONOSCOPY PROCEDURE REPORT  PATIENT:  Haley Lowe, Haley Lowe  MR#:  [id number] BIRTHDATE:  24-Feb-1956, 55 yrs. old  GENDER:  female ENDOSCOPIST:  Hedwig Morton. Juanda Chance, MD REF. BY:  Drue Second, M.D. Artis Delay, M.D. PROCEDURE DATE:  10/13/2011 PROCEDURE:  Colonoscopy with biopsy and snare polypectomy ASA CLASS:  Class III INDICATIONS:  heme positive stool metastatic adeno0carcinoma to the brain, CEA 6.7 colonoscopy 2005 and 2008- hyperplastic polyp looking for a primary site MEDICATIONS:   MAC sedation, administered by CRNA, propofol (Diprivan) 150 mg  DESCRIPTION OF PROCEDURE:   After the risks and benefits and of the procedure were explained, informed consent was obtained. Digital rectal exam was performed and revealed no rectal masses. The [facility] [id number]-[id number] endoscope was introduced through the anus and advanced to the cecum, which was identified by both the appendix and ileocecal valve.  The quality of the prep was good, using Nulytley.  The instrument was then slowly withdrawn as the colon was fully examined. <<PROCEDUREIMAGES>>  FINDINGS:  Three polyps were found. 2-3 mm polyps at 15 -20 cm and in the rectum The polyps were removed using cold biopsy forceps. Polyp was snared without cautery. Retrieval was successful (see image9, image8, image7, and image6). snare polyp  This was otherwise a normal examination of the colon (see image5, image4, image3, image2, and image1).   Retroflexed views in the rectum revealed no abnormalities.    The scope was then withdrawn from the patient and the procedure completed.  COMPLICATIONS:  None ENDOSCOPIC IMPRESSION: 1) Three polyps 2) Otherwise normal examination nothing to explain elevated CEA level. No carcinoma. Heme positive stool explained by blood in the stomach RECOMMENDATIONS: 1) Await pathology results consider MRCP or EUS to look for a pancreatic lesion  REPEAT  EXAM:  In 0 year(s) for.  ______________________________ Hedwig Morton. Juanda Chance, MD  CC:  n. eSIGNED:   Hedwig Morton. Armentha Branagan at 10/13/2011 04:35 PM  Rosalita Chessman, [phone]

## 2011-10-14 ENCOUNTER — Telehealth: Payer: Self-pay | Admitting: *Deleted

## 2011-10-14 DIAGNOSIS — C801 Malignant (primary) neoplasm, unspecified: Secondary | ICD-10-CM

## 2011-10-14 DIAGNOSIS — R978 Other abnormal tumor markers: Secondary | ICD-10-CM

## 2011-10-14 NOTE — Telephone Encounter (Signed)
Message copied by Florene Glen on Thu Oct 14, 2011 11:22 AM ------      Message from: Tripoli Carwin      Created: Thu Oct 14, 2011  9:20 AM      Regarding: MRCP       Aram Beecham, could you, please schedule Mrs Turbin for an MRCp to look for a pancreato-biliary malignancy. She has an elevated Ca-19-9 =67 and has a adenocarcinoma metastatic to the brain. We are lookin for a primary. I have discussed it with Dr Cherene Altes. Please send her the report.Thanx Mrs Silveria is expecting the call

## 2011-10-14 NOTE — Telephone Encounter (Signed)
  Follow up Call-  Call back number 10/13/2011  Post procedure Call Back phone  # [phone]  Permission to leave phone message Yes     Patient questions:  Do you have a fever, pain , or abdominal swelling? no Pain Score  0 *  Have you tolerated food without any problems? yes  Have you been able to return to your normal activities? yes  Do you have any questions about your discharge instructions: Diet   no Medications  no Follow up visit  no  Do you have questions or concerns about your Care? yes  Actions: * If pain score is 4 or above: No action needed, pain <4.

## 2011-10-14 NOTE — Telephone Encounter (Signed)
Notified pt's daughter Helyn App that pt is scheduled for MRCP tomorrow at 8PM at [facility]; there is no prep. Gave her directions to MRI and she stated understanding.

## 2011-10-15 ENCOUNTER — Ambulatory Visit (HOSPITAL_COMMUNITY)
Admission: RE | Admit: 2011-10-15 | Discharge: 2011-10-15 | Disposition: A | Discharge status: Home / Self Care | Payer: BC Managed Care – PPO | Source: Ambulatory Visit | Attending: Internal Medicine | Admitting: Internal Medicine

## 2011-10-15 ENCOUNTER — Other Ambulatory Visit: Payer: Self-pay | Admitting: Internal Medicine

## 2011-10-15 ENCOUNTER — Ambulatory Visit: Payer: BC Managed Care – PPO | Admitting: Internal Medicine

## 2011-10-15 DIAGNOSIS — C801 Malignant (primary) neoplasm, unspecified: Secondary | ICD-10-CM

## 2011-10-15 DIAGNOSIS — R978 Other abnormal tumor markers: Secondary | ICD-10-CM

## 2011-10-15 DIAGNOSIS — Z9089 Acquired absence of other organs: Secondary | ICD-10-CM | POA: Insufficient documentation

## 2011-10-15 DIAGNOSIS — C7931 Secondary malignant neoplasm of brain: Secondary | ICD-10-CM | POA: Insufficient documentation

## 2011-10-15 DIAGNOSIS — K7689 Other specified diseases of liver: Secondary | ICD-10-CM | POA: Insufficient documentation

## 2011-10-15 DIAGNOSIS — C7949 Secondary malignant neoplasm of other parts of nervous system: Secondary | ICD-10-CM | POA: Insufficient documentation

## 2011-10-15 MED ORDER — GADOBENATE DIMEGLUMINE 529 MG/ML IV SOLN
15.0000 mL | Freq: Once | INTRAVENOUS | Status: AC | PRN
  Administered 2011-10-15: 15 mL via INTRAVENOUS

## 2011-10-18 ENCOUNTER — Encounter: Payer: Self-pay | Admitting: Pulmonary Disease

## 2011-10-18 ENCOUNTER — Telehealth: Payer: Self-pay | Admitting: Internal Medicine

## 2011-10-18 ENCOUNTER — Ambulatory Visit (INDEPENDENT_AMBULATORY_CARE_PROVIDER_SITE_OTHER): Payer: BC Managed Care – PPO | Admitting: Pulmonary Disease

## 2011-10-18 ENCOUNTER — Encounter: Payer: Self-pay | Admitting: Internal Medicine

## 2011-10-18 VITALS — BP 120/88 | HR 74 | Temp 97.9°F | Ht 62.0 in | Wt 170.0 lb

## 2011-10-18 DIAGNOSIS — R918 Other nonspecific abnormal finding of lung field: Secondary | ICD-10-CM

## 2011-10-18 DIAGNOSIS — J449 Chronic obstructive pulmonary disease, unspecified: Secondary | ICD-10-CM | POA: Insufficient documentation

## 2011-10-18 LAB — PULMONARY FUNCTION TEST

## 2011-10-18 NOTE — Progress Notes (Signed)
Chief Complaint  Patient presents with  . Follow-up    W/ PFT. Pt states her breathing has been fine. Pt c/o slight cough, swelling all over due to ped taper. denies any wheezing, chest tx. Pt had brain surgery june 12.    History of Present Illness: Haley Lowe is a 56 y.o. female former smoker for evaluation of pulmonary nodule.  She is here to review PFT.  She had colonoscopy and EGD with Dr. Juanda Chance last week.  Biopsy results are pending.  She had MRI abdomin which was negative.  She may need SBC endoscopy to assess for primary small bowel carcinoma.   Past Medical History  Diagnosis Date  . Hypertension   . PONV (postoperative nausea and vomiting)   . Anxiety   . Depression   . COPD (chronic obstructive pulmonary disease)   . GERD (gastroesophageal reflux disease)   . Headache   . Arthritis   . Adenocarcinoma 09/28/2011  . Hx of adenomatous colonic polyps   . Pulmonary nodules   . Diabetes mellitus   . Emphysema   . 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 Allergy     hips and shoulders  . Cataract     1 lens implaqnt right eye,intact on left eye  . Goiter     nodule watching  . Fibromyalgia     neuropathy tops of both feet    Past Surgical History  Procedure Date  . Abdominal hysterectomy 1996  . Cholecystectomy 2000  . Shoulder surgery 1998    right  . Eye surgery 2001    vision correction  . Foot surgery 2005    left  . Craniotomy 09/15/2011    Procedure: CRANIOTOMY TUMOR EXCISION;  Surgeon: [name], MD;  Location: [facility];  Service: Neurosurgery;  Laterality: N/A;  Craniotomy resection of tumor with stealth  . Cataract extraction 2011    with lens implant    Outpatient Encounter Prescriptions as of 10/18/2011  Medication Sig Dispense Refill  . ALPRAZolam (XANAX) 0.5 MG tablet Take 0.5 mg by mouth 4 (four) times daily as needed. For anxiety 0.5 1-2 tablets four times daily      . Cholecalciferol (VITAMIN  D) 2000 UNITS CAPS Take 1 capsule by mouth daily.      Marland Kitchen CLODERM 0.1 % cream       . dexamethasone (DECADRON) 4 MG tablet Use as directed      . divalproex (DEPAKOTE) 500 MG DR tablet 500 mg 2 (two) times daily.       Marland Kitchen docusate sodium (COLACE) 100 MG capsule Take 100 mg by mouth as needed.      . famotidine (PEPCID) 20 MG tablet Take 20 mg by mouth 2 (two) times daily.      Marland Kitchen FLUCONAZOLE PO Take 1 tablet by mouth. x7 days      . HYDROcodone-acetaminophen (NORCO) 5-325 MG per tablet Take 1 tablet by mouth every 4 (four) hours as needed.      Marland Kitchen ibuprofen (ADVIL,MOTRIN) 800 MG tablet Take 800 mg by mouth 3 (three) times daily as needed. For pain.      . metoprolol tartrate (LOPRESSOR) 25 MG tablet Take 25 mg by mouth 2 (two) times daily.      . mirtazapine (REMERON) 15 MG tablet Take 4 mg by mouth at bedtime.       Marland Kitchen nystatin (MYCOSTATIN) 100000 UNIT/ML suspension Take 500,000 Units by mouth 4 (four) times  daily.      . olmesartan-hydrochlorothiazide (BENICAR HCT) 20-12.5 MG per tablet Take 0.25 tablets by mouth daily.      . Probiotic Product (PROBIOTIC DAILY) CAPS Take 1 capsule by mouth daily.      . Simethicone (GAS-X PO) Take 1 tablet by mouth as needed. 1-2 prn      . simvastatin (ZOCOR) 10 MG tablet Take 10 mg by mouth at bedtime.      Marland Kitchen UNABLE TO FIND Apply 1 application topically as needed. Med Name:  Zovarix        Allergies  Allergen Reactions  . Penicillins Swelling    " my brain swelled, and mymouth"  . Codeine Itching    Mild takes benadryl    Physical Exam:  Blood pressure 120/88, pulse 74, temperature 97.9 F (36.6 C), temperature source Oral, height 5\' 2"  (1.575 m), weight 170 lb (77.111 kg), SpO2 94.00%. Body mass index is 31.09 kg/(m^2). Wt Readings from Last 2 Encounters:  10/18/11 170 lb (77.111 kg)  10/13/11 173 lb (78.472 kg)   General - No distress  ENT - Ears normal. Throat and pharynx normal. Neck supple. No adenopathy or masses in the neck or  supraclavicular regions. Nasal septum midline, no deformities, nares patent, normal mucosa without swelling, no polyps, no bleeding. Paranasal sinuses are normal. No tenderness to the maxillary, frontal, ethmoids or mastoids.  Cardiac - S1 and S2 normal, no murmurs, clicks, gallops or rubs. Regular rate and rhythm. No edema or JVD.  Chest - Chest is clear, no wheezing or rales. Normal symmetric air entry throughout both lung fields. No chest wall deformities or tenderness.  Back - Cervical, thoracic and lumbar spine exam is normal without tenderness, masses or kyphoscoliosis. Full range of motion without pain is noted.  Abd - soft without tenderness, guarding, mass, rebound or organomegaly. Bowel sounds are normal. No CVA tenderness noted.  Ext - good pulses, motor strength and sensation.  Neuro - Cranial nerves are normal. PERLA. EOM's intact. Well healing craniotomy scar  Skin - no discernible active dermatitis, erythema, urticaria or inflammatory process.  Psych - normal mood, and behavior.  PFT 10/18/11>>FEV1 1.23 (65%), FEV1% 62, TLC 5.28 (117%), DLCO 63%, +BD   Assessment/Plan:  Coralyn Helling, MD [facility] Pager:  [phone] 10/18/2011, 10:43 AM

## 2011-10-18 NOTE — Telephone Encounter (Signed)
RETURNING YOUR CALL REGARDING FURTHER APPOINTMENTS.  SHE SAID YOU CAN LEAVE ON HER VOICE MAIL IF SHE DOES NOT ANSWER.

## 2011-10-18 NOTE — Assessment & Plan Note (Signed)
Will await results of GI evaluation, and then determine if she needs bronchoscopy.  Again concern is that lung nodule is rather small, and may not be source of primary malignancy.

## 2011-10-18 NOTE — Telephone Encounter (Signed)
Patient is scheduled for capsule teaching 10/21/11 2:00 and capsule endo on 10/25/11 8:00

## 2011-10-18 NOTE — Telephone Encounter (Signed)
Per Dr. Juanda Chance patient needs a SBCE.  The patient has asked that I speak with her daughter Lavonna Rua to schdule.  I have left a message for the daughter to call back

## 2011-10-18 NOTE — Patient Instructions (Signed)
Follow-up in 4 to 6 weeks 

## 2011-10-18 NOTE — Assessment & Plan Note (Signed)
She has moderate COPD with bronchodilator responsiveness.  I reviewed results with her.  I explained that she may benefit from inhaler therapy.  She does not want any additional medicine at this time.  I have advised her to call if she changes her opinion.  Will discuss in more detail at next visit.

## 2011-10-18 NOTE — Progress Notes (Signed)
PFT done today. 

## 2011-10-19 ENCOUNTER — Encounter: Payer: Self-pay | Admitting: Internal Medicine

## 2011-10-21 ENCOUNTER — Other Ambulatory Visit: Payer: Self-pay | Admitting: *Deleted

## 2011-10-21 NOTE — Progress Notes (Signed)
Pt here for teaching for a Capsule Endoscopy on Monday, October 25, 2011. Instructed pt on reason for test, prep, NPO status and actions with diet after the pill is ingested. Pt is on several meds including anti seizure medication. After speaking with Darcey Nora, RN, pt was instructed to take her necessary meds at 6am so they hopefully will not interfere with the capsule. Pt and her husband stated understanding.

## 2011-10-24 NOTE — Progress Notes (Addendum)
  Radiation Oncology         [phone] ________________________________  Name: Haley Lowe MRN: [id number]  Date: 10/06/2011  DOB: 02/01/1956  End of Treatment Note  CC: Molly Maduro  Diagnosis:   Metastatic adenocarcinoma of unknown primary     Indication for treatment:  Palliative       Radiation treatment dates:   1 fraction delivered on 10/06/2011  Site/dose:   Single postoperative region in the left frontoparietal region. This area received 15 gray in 1 fraction using 4 dynamic conformal arcs.  Narrative: The patient tolerated radiation treatment well.  No difficulties with delivery of the planned radiation treatment. She was observed post-treatment and did not have any complaints. Vital signs were stable.   Plan: The patient has completed radiation treatment. The patient will return to radiation oncology clinic for routine followup in one month. I advised the patient to call or return sooner if they have any questions or concerns related to their recovery or treatment. ________________________________  Radene Gunning, M.D., Ph.D.

## 2011-10-24 NOTE — Addendum Note (Signed)
Encounter addended by: Jonna Coup, MD on: 10/24/2011  1:38 AM<BR>     Documentation filed: Notes Section

## 2011-10-24 NOTE — Progress Notes (Signed)
  Radiation Oncology         [phone] ________________________________  Name: Haley Lowe MRN: [id number]  Date: 10/04/2011  DOB: [birth year]  SIMULATION AND TREATMENT PLANNING NOTE  DIAGNOSIS:  Metastatic adenocarcinoma, unknown primary.  NARRATIVE:  The patient was brought to the CT Simulation planning suite.  Identity was confirmed.  All relevant records and images related to the planned course of therapy were reviewed.  The patient freely provided informed written consent to proceed with treatment after reviewing the details related to the planned course of therapy. The consent form was witnessed and verified by the simulation staff. Intravenous access was established for contrast administration. Then, the patient was set-up in a stable reproducible supine position for radiation therapy.  A relocatable thermoplastic stereotactic head frame was fabricated for precise immobilization.  CT images were obtained.  Surface markings were placed.  The CT images were loaded into the planning software and fused with the patient's targeting MRI scan.  Then the target and avoidance structures were contoured.  Treatment planning then occurred.  The radiation prescription was entered and confirmed.  I have requested 3D planning  I have requested a DVH of the following structures: Brain stem, brain, left eye, right I, lenses, optic chiasm, target volumes, uninvolved brain, and normal tissue.    PLAN:  The patient will receive 15 Gy in 1 fraction to the postoperative cavity in the left frontoparietal region.  ________________________________  Radene Gunning, MD, PhD

## 2011-10-25 ENCOUNTER — Ambulatory Visit (INDEPENDENT_AMBULATORY_CARE_PROVIDER_SITE_OTHER): Payer: BC Managed Care – PPO | Admitting: Internal Medicine

## 2011-10-25 DIAGNOSIS — R195 Other fecal abnormalities: Secondary | ICD-10-CM

## 2011-10-25 DIAGNOSIS — C179 Malignant neoplasm of small intestine, unspecified: Secondary | ICD-10-CM

## 2011-10-25 NOTE — Progress Notes (Addendum)
Pt in at 0800am today and stated she did her prep last pm with BMs and hasn't had anything to drink or eat since midnight last night. Pt swallowed the capsule w/o difficulty and computer module was connected to [employer]. Pt lay on her side for 30 minutes and was sent home with the day's instructions. Pt was given the 2 hour time for clear liquid drink and a 4 hr time for a small meal; pt stated understanding. Pt back at 4:15pm stating she had no problems. Computer module removed and connected to recorder. Pt discharged home with instructions. She denies any problems.    LOT 2013-40/20891S       25.

## 2011-10-26 ENCOUNTER — Ambulatory Visit (HOSPITAL_BASED_OUTPATIENT_CLINIC_OR_DEPARTMENT_OTHER): Payer: BC Managed Care – PPO | Admitting: Lab

## 2011-10-26 ENCOUNTER — Telehealth: Payer: Self-pay | Admitting: Oncology

## 2011-10-26 ENCOUNTER — Telehealth: Payer: Self-pay | Admitting: Internal Medicine

## 2011-10-26 ENCOUNTER — Encounter: Payer: Self-pay | Admitting: Oncology

## 2011-10-26 ENCOUNTER — Ambulatory Visit (HOSPITAL_BASED_OUTPATIENT_CLINIC_OR_DEPARTMENT_OTHER): Payer: BC Managed Care – PPO | Admitting: Oncology

## 2011-10-26 VITALS — BP 108/69 | HR 73 | Temp 98.5°F | Ht 62.0 in | Wt 173.9 lb

## 2011-10-26 DIAGNOSIS — C7949 Secondary malignant neoplasm of other parts of nervous system: Secondary | ICD-10-CM

## 2011-10-26 DIAGNOSIS — C7931 Secondary malignant neoplasm of brain: Secondary | ICD-10-CM

## 2011-10-26 DIAGNOSIS — C801 Malignant (primary) neoplasm, unspecified: Secondary | ICD-10-CM

## 2011-10-26 DIAGNOSIS — R918 Other nonspecific abnormal finding of lung field: Secondary | ICD-10-CM

## 2011-10-26 LAB — CBC WITH DIFFERENTIAL/PLATELET
BASO%: 0.3 % (ref 0.0–2.0)
HCT: 45.5 % (ref 34.8–46.6)
LYMPH%: 22.9 % (ref 14.0–49.7)
MCHC: 34.1 g/dL (ref 31.5–36.0)
MCV: 90.1 fL (ref 79.5–101.0)
MONO#: 0.7 10*3/uL (ref 0.1–0.9)
MONO%: 10.3 % (ref 0.0–14.0)
NEUT%: 66.2 % (ref 38.4–76.8)
Platelets: 138 10*3/uL — ABNORMAL LOW (ref 145–400)
RBC: 5.05 10*6/uL (ref 3.70–5.45)
WBC: 7.1 10*3/uL (ref 3.9–10.3)
nRBC: 0 % (ref 0–0)

## 2011-10-26 LAB — COMPREHENSIVE METABOLIC PANEL
AST: 12 U/L (ref 0–37)
BUN: 12 mg/dL (ref 6–23)
CO2: 27 mEq/L (ref 19–32)
Calcium: 9.3 mg/dL (ref 8.4–10.5)
Chloride: 103 mEq/L (ref 96–112)
Creatinine, Ser: 0.55 mg/dL (ref 0.50–1.10)
Total Bilirubin: 0.4 mg/dL (ref 0.3–1.2)

## 2011-10-26 MED ORDER — LORAZEPAM 0.5 MG PO TABS: 0.5000 mg | ORAL_TABLET | Freq: Four times a day (QID) | ORAL | Status: DC | PRN

## 2011-10-26 MED ORDER — PROCHLORPERAZINE 25 MG RE SUPP: 25.0000 mg | Freq: Two times a day (BID) | RECTAL | Status: DC | PRN

## 2011-10-26 MED ORDER — ONDANSETRON HCL 8 MG PO TABS: ORAL_TABLET | ORAL | Status: DC

## 2011-10-26 MED ORDER — DEXAMETHASONE 4 MG PO TABS: ORAL_TABLET | ORAL | Status: DC

## 2011-10-26 MED ORDER — PROCHLORPERAZINE MALEATE 10 MG PO TABS
10.0000 mg | ORAL_TABLET | Freq: Four times a day (QID) | ORAL | Status: DC | PRN
Start: 2011-10-26 — End: 2012-01-26

## 2011-10-26 NOTE — Telephone Encounter (Signed)
gve the pt's dtr the July-aug,sept 2013 appt calendar. The pt is aware to pick up a revised calendar when she comes in to the chemo class. Sent michelle a staff message to add the chemo

## 2011-10-26 NOTE — Telephone Encounter (Signed)
Patient had capsule procedure yesterday, patient's primary care doctor switched her from Pepcid to Nexium and gave her a prescription for Carafate.  The daughter is wanting to know if this change will be ok with all the procedures she has had done and if this would be Dr. Regino Schultze recommendation in regards to her medicine.  Can leave message on daughter's cell phone.

## 2011-10-26 NOTE — Telephone Encounter (Signed)
S/w jennifer from [facility] regarding the appt for the port placement. Pt's dtr is aware that they will be called with the appt

## 2011-10-26 NOTE — Progress Notes (Signed)
OFFICE PROGRESS NOTE  CC  [name], MD [address] [place] [address] Dr. Shirlean Kelly Dr. Dorothy Puffer Dr. [name]  DIAGNOSIS: 56 year old female with metastatic adenocarcinoma with brain metastasis Unknown primary but possibly an upper GI source versus lung primary  PRIOR THERAPY:  #1 patient underwent resectionon 6/12 of brain met with the pathology showing a adenocarcinoma likely upper GI primary. Patient has also had SRS on 10/06/2011.  #2 patient has had multiple staging studies including PET CT which showed a 7 mm nodule in the right upper lobe which was not seen previously on CT of the chest. She subsequently was seen by Dr. Craige Cotta in pulmonary for possible bronchoscopy/biopsy.  #3 patient also has had consultation with Dr. [name] she has undergone endoscopy colonoscopy without any evidence of a primary. She has undergone capsule endoscopy results of which are pending. Patient also has had MRCP and that was negative.  #4 Patient to begin chemotherapy pending results of the capsule endoscopy. If the capsule endoscopy is negative she will begin chemotherapy consisting of Taxol carboplatinum given every 21 days for a total of 4-6 cycles.  CURRENT THERAPY: Carbo Taxol tentatively scheduled for 11/12/2011  INTERVAL HISTORY: Haley Lowe 56 y.o. female returns for Followup visit today. She has had SRS performed overall she tolerated it very well. She has also been seen by pulmonary for the 7 mm lung nodule for consideration of possible bronchoscopy. Patient also was evaluated by Dr. Dickie La and has undergone an endoscopy and a colonoscopy without any finding of a primary malignancy. She had a capsule endoscopy performed yesterday results of which are pending. MRCP was negative for any evidence of primary in the liver or pancreas. Clinically patient seems to be doing well. She is status fatigued from having all the testing performed. She denies any  headaches double vision blurring of vision she does continue to smoke and wants to quit as soon as possible she is using the vapor cigarettes for now. She denies any nausea vomiting no fevers chills night sweats she is eating well and she is ambulatory without any problems with her gait. She has her strength back in her arms. Remainder of the 10 point review of systems is negative.  MEDICAL HISTORY: Past Medical History  Diagnosis Date  . Hypertension   . PONV (postoperative nausea and vomiting)   . Anxiety   . Depression   . COPD (chronic obstructive pulmonary disease)   . GERD (gastroesophageal reflux disease)   . Headache   . Arthritis   . Adenocarcinoma 09/28/2011  . Hx of adenomatous colonic polyps   . Pulmonary nodules   . Diabetes mellitus   . Emphysema   . 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 Allergy     hips and shoulders  . Cataract     1 lens implaqnt right eye,intact on left eye  . Goiter     nodule watching  . Fibromyalgia     neuropathy tops of both feet    ALLERGIES:  is allergic to penicillins and codeine.  MEDICATIONS:  Present medications are reviewed and updated  SURGICAL HISTORY:  Past Surgical History  Procedure Date  . Abdominal hysterectomy 1996  . Cholecystectomy 2000  . Shoulder surgery 1998    right  . Eye surgery 2001    vision correction  . Foot surgery 2005    left  . Craniotomy 09/15/2011    Procedure: CRANIOTOMY  TUMOR EXCISION;  Surgeon: [name], MD;  Location: [facility];  Service: Neurosurgery;  Laterality: N/A;  Craniotomy resection of tumor with stealth  . Cataract extraction 2011    with lens implant    REVIEW OF SYSTEMS:  Constitutional: positive for fatigue Ears, nose, mouth, throat, and face: negative Respiratory: negative Cardiovascular: negative Gastrointestinal: negative Genitourinary:negative Integument/breast: negative Hematologic/lymphatic:  negative Musculoskeletal:positive for arthralgias Neurological: positive for weakness   PHYSICAL EXAMINATION: General appearance: alert, cooperative and appears older than stated age Resp: clear to auscultation bilaterally and normal percussion bilaterally Back: symmetric, no curvature. ROM normal. No CVA tenderness. Cardio: regularly irregular rhythm GI: soft, non-tender; bowel sounds normal; no masses,  no organomegaly Extremities: extremities normal, atraumatic, no cyanosis or edema Neurologic: Grossly normal  ECOG PERFORMANCE STATUS: 1 - Symptomatic but completely ambulatory  Blood pressure 108/69, pulse 73, temperature 98.5 F (36.9 C), temperature source Oral, height 5\' 2"  (1.575 m), weight 173 lb 14.4 oz (78.881 kg).  LABORATORY DATA: Lab Results  Component Value Date   WBC 14.3* 09/28/2011   HGB 14.7 09/28/2011   HCT 44.9 09/28/2011   MCV 89.2 09/28/2011   PLT 267 09/28/2011      Chemistry      Component Value Date/Time   NA 135 09/28/2011 1338   K 4.1 09/28/2011 1338   CL 100 09/28/2011 1338   CO2 26 09/28/2011 1338   BUN 19 09/28/2011 1338   CREATININE 0.68 09/28/2011 1338      Component Value Date/Time   CALCIUM 9.8 09/28/2011 1338   ALKPHOS 54 09/28/2011 1338   AST 10 09/28/2011 1338   ALT 15 09/28/2011 1338   BILITOT 0.4 09/28/2011 1338       RADIOGRAPHIC STUDIES:  Mr Laqueta Jean ZO Contrast  10/01/2011  *RADIOLOGY REPORT*  Clinical Data: Metastatic adenocarcinoma to the brain, unspecified site.  S R S targeting.  MRI HEAD WITHOUT AND WITH CONTRAST  Technique:  Multiplanar, multiecho pulse sequences of the brain and surrounding structures were obtained according to standard protocol without and with intravenous contrast  Contrast: 15mL MULTIHANCE GADOBENATE DIMEGLUMINE 529 MG/ML IV SOLN  Comparison: Preoperative study 09/09/2011.  Findings: The patient is status post left posterior frontal craniotomy for piecemeal removal of a metastatic lesion, adenocarcinoma of unknown  primary.  Mild restricted diffusion reflects postoperative change/hematoma.  Slight cerebral atrophy. Mild chronic microvascular ischemic change. Minimal surrounding vasogenic edema.  Thin section imaging post contrast reveals a lesion that appears to be solitary. The lesion is considerably decreased in size compared with the preoperative study, again roughly spherical with 9 mm diameter compared with 21 mm previously.  Intrinsic T1 shortening centrally representing postoperative hemorrhage is similar in signal intensity to post contrast enhancement on post infusion images. Subtraction sequence (series 101) shows ring-like enhancement, which suggests residual peripheral tumor inferiorly (images 122 - 127) as well as more superiorly and anteriorly (images 130-136). These areas of peripheral enhancement are unlikely to represent granulation tissue given the relatively short interval  since recent surgery (09/15/2011).  IMPRESSION: Solitary postoperative left posterior frontal metastatic lesion. Marked reduction in tumor size and surrounding vasogenic edema. Peripheral enhancement seen best on postcontrast subtraction images suggests residual tumor.  Original Report Authenticated By: Elsie Stain, M.D.   Mr 3d Recon At Scanner  10/16/2011  *RADIOLOGY REPORT*  Clinical Data:  Metastatic adenocarcinoma to the brain.  No known primary.  MRI ABDOMEN WITHOUT AND WITH CONTRAST (INCLUDING MRCP)  Technique:  Multiplanar multisequence MR imaging of the  abdomen was performed both before and after the administration of intravenous contrast. Heavily T2-weighted images of the biliary and pancreatic ducts were obtained, and three-dimensional MRCP images were rendered by post processing.  Contrast: 15mL MULTIHANCE GADOBENATE DIMEGLUMINE 529 MG/ML IV SOLN  Comparison:  PET CT 09/30/2011.  CTs of the chest, abdomen and pelvis 09/09/2011.  Findings:  The gallbladder is surgically absent.  There is no intra or extrahepatic biliary  dilatation.  There is no evidence of choledocholithiasis.  The pancreatic ductal anatomy is normal. There is no pancreatic ductal dilatation.  There is no evidence of pancreatic mass or surrounding fluid collection.  There are small cysts peripherally in the right hepatic lobe and in the caudate lobe.  No enhancing or otherwise suspicious liver lesions are present. There is mild hepatic steatosis.  There is a small accessory splenule.  The adrenal glands and kidneys appear normal.  There is no ascites, peritoneal nodularity or lymphadenopathy.  Aorto iliac atherosclerosis and ectasia appear stable.  No suspicious osseous findings are seen.  IMPRESSION:  1.  No evidence of primary malignancy within the abdomen. 2.  No evidence of metastatic disease within the abdomen. 3.  Small hepatic cysts and mild steatosis. 4.  No biliary dilatation status post cholecystectomy.  Original Report Authenticated By: Gerrianne Scale, M.D.   Nm Pet Image Initial (pi) Whole Body  09/30/2011  *RADIOLOGY REPORT*  Clinical Data:  Initial treatment strategy for metastatic intracranial adenocarcinoma.  Evaluate for primary.  NUCLEAR MEDICINE WHOLE BODY PET CT  Technique:  Technique:  17.1 mCi F-18 FDG was injected intravenously. CT data was obtained and used for attenuation correction and anatomic localization only.  (This was not acquired as a diagnostic CT examination.) Additional exam technical data entered on technologist worksheet.  Comparison: Head CT of 09/13/2011.  Chest abdomen and pelvic CTs of 09/09/2011.  Findings: Head/neck: Area of photopenia within the left high frontal lobe which likely is postoperative.  Chest:  A focus of hypermetabolism within the anteromedial right upper lobe.  This measures a S.U.V. max of 4.7 on image 110.  This is felt to correspond to a possible 7x10 mm nodule within the right upper lobe including on transverse image 25 of series 3 and sagittal image 49 of series 5 on the 09/09/2011 CT.  No  abnormal thoracic nodal activity.  Abdomen/Pelvis:  Subtle hypermetabolism within the anterior most aspect of the left lobe of the liver.  This measures a S.U.V. max of 4.8 on image 148.  This may correspond to subtle hypoattenuation on image 48 series 2 on the 09/09/2011 CT.  [place]/Extremities:  No abnormal hypermetabolism.  CT images performed for attenuation correction demonstrate surgical changes of left frontal parietal craniotomy.  No significant findings within the neck.  Chest findings deferred to recent diagnostic chest CT.  Moderate centrilobular emphysema.  Abdominal pelvic findings deferred to recent diagnostic CT. No acute superimposed process.  IMPRESSION:  1.  Possible subtle 7 mm hypermetabolic right upper lobe lung nodule.  This area is difficult to differentiate from adjacent sub tending vessels but is suspicious, especially on sagittal reformatted images from the 09/09/2011 CT. Otherwise, no primary malignancy identified.  Of note this lesion would not typically cause intracranial metastasis, given small size and lack of adenopathy. 2.  Subtle hypermetabolism in the anterior left liver lobe.  This may correspond to a vague wedge-shaped hypoattenuation on the recent CT.  Not typical in morphology for hepatic metastasis. Consider short-term CT follow-up versus further characterization with  pre and post contrast abdominal MRI.  Original Report Authenticated By: Consuello Bossier, M.D.   Mm Digital Diagnostic Bilat  10/05/2011  *RADIOLOGY REPORT*  Clinical Data:  Metastatic neoplasm to the brain.  DIGITAL DIAGNOSTIC BILATERAL MAMMOGRAM WITH CAD  Comparison:  November 03, 2010, January 23, 2009, October 27, 2007  Findings:  CC and MLO views of bilateral breasts are submitted.  No suspicious abnormality is identified in both breasts. Mammographic images were processed with CAD.  IMPRESSION: Negative.  RECOMMENDATION: Routine screening mammogram in 1 year.  BI-RADS CATEGORY 1:  Negative.  Original Report  Authenticated By: Sherian Rein, M.D.   Mr Abd W/wo Cm/mrcp  10/16/2011  *RADIOLOGY REPORT*  Clinical Data:  Metastatic adenocarcinoma to the brain.  No known primary.  MRI ABDOMEN WITHOUT AND WITH CONTRAST (INCLUDING MRCP)  Technique:  Multiplanar multisequence MR imaging of the abdomen was performed both before and after the administration of intravenous contrast. Heavily T2-weighted images of the biliary and pancreatic ducts were obtained, and three-dimensional MRCP images were rendered by post processing.  Contrast: 15mL MULTIHANCE GADOBENATE DIMEGLUMINE 529 MG/ML IV SOLN  Comparison:  PET CT 09/30/2011.  CTs of the chest, abdomen and pelvis 09/09/2011.  Findings:  The gallbladder is surgically absent.  There is no intra or extrahepatic biliary dilatation.  There is no evidence of choledocholithiasis.  The pancreatic ductal anatomy is normal. There is no pancreatic ductal dilatation.  There is no evidence of pancreatic mass or surrounding fluid collection.  There are small cysts peripherally in the right hepatic lobe and in the caudate lobe.  No enhancing or otherwise suspicious liver lesions are present. There is mild hepatic steatosis.  There is a small accessory splenule.  The adrenal glands and kidneys appear normal.  There is no ascites, peritoneal nodularity or lymphadenopathy.  Aorto iliac atherosclerosis and ectasia appear stable.  No suspicious osseous findings are seen.  IMPRESSION:  1.  No evidence of primary malignancy within the abdomen. 2.  No evidence of metastatic disease within the abdomen. 3.  Small hepatic cysts and mild steatosis. 4.  No biliary dilatation status post cholecystectomy.  Original Report Authenticated By: Gerrianne Scale, M.D.    ASSESSMENT: 56 year old female with  #1 metastatic adenocarcinoma of unknown primary with brain metastasis. Patient is now status post  SRS. Post procedure she is doing quite well.  #2 we have performed extensive studies to try to elucidate  the primary for at the adenocarcinoma unfortunately up-to-date all scans mammograms and procedures have been negative. Patient's PET scan did show a 7 millimeter nodule that had hypermetabolic activity concerning for a lung primary in this smoker.Further evaluation will be performed by pulmonary.  #3 in the meantime patient and family are quite anxious to get started with systemic therapy. We had an extensive discussion regarding the differential diagnosis and the possible primaries. Since patient is a smoker I do think that this may be a lung primary versus an upper GI although we have done an exhaustive workup for upper GI and a primary is not visible. I therefore have recommended treatment with systemic chemotherapy consisting of Taxol and carboplatinum which we would use for an unknown primary.   PLAN:   #1 patient will need a Port-A-Cath placed and I have ordered this. She will also need chemotherapy teaching class.  #2 we discussed risks and benefits of chemotherapy and the rationale. My plan is to treat her with Taxol and carboplatinum for 4-6 cycles. After we do 3 cycles of  this combination we will plan on obtaining restaging studies specifically looking at the pulmonary nodule to see if there is any response.  #3 all questions were answered today patient and her family are quite satisfied with this plan of care.   All questions were answered. The patient knows to call the clinic with any problems, questions or concerns. We can certainly see the patient much sooner if necessary.  I spent 30 minutes counseling the patient face to face. The total time spent in the appointment was 30 minutes.    Drue Second, MD Medical/Oncology [facility] [phone] (beeper) [phone] (Office)  10/26/2011, 11:11 AM

## 2011-10-26 NOTE — Telephone Encounter (Signed)
Patient went to her PCP and he wants to switch her from Pepcid to Nexium BID and Carafate QID. [name] wants to be sure Dr. Juanda Lowe agrees with this before patient starts these medications. Please, advise

## 2011-10-26 NOTE — Telephone Encounter (Signed)
Spoke with Cordelia Pen and gave her Dr. Regino Schultze recommendation.

## 2011-10-26 NOTE — Telephone Encounter (Signed)
This change is fine. I agree.

## 2011-10-26 NOTE — Patient Instructions (Addendum)
1. We discussed chemotherapy which will begin on 8/9  2. Need porta cath placement  3. Chemo class

## 2011-10-29 ENCOUNTER — Telehealth: Payer: Self-pay | Admitting: Medical Oncology

## 2011-10-29 ENCOUNTER — Ambulatory Visit (HOSPITAL_COMMUNITY)
Admission: RE | Admit: 2011-10-29 | Discharge: 2011-10-29 | Disposition: A | Discharge status: Home / Self Care | Payer: BC Managed Care – PPO | Source: Ambulatory Visit | Attending: Internal Medicine | Admitting: Internal Medicine

## 2011-10-29 ENCOUNTER — Other Ambulatory Visit (HOSPITAL_COMMUNITY): Payer: Self-pay | Admitting: Internal Medicine

## 2011-10-29 ENCOUNTER — Other Ambulatory Visit: Payer: Self-pay | Admitting: Physician Assistant

## 2011-10-29 VITALS — BP 118/64 | HR 75 | Temp 98.6°F | Resp 16 | Ht 62.0 in | Wt 174.0 lb

## 2011-10-29 DIAGNOSIS — I82402 Acute embolism and thrombosis of unspecified deep veins of left lower extremity: Secondary | ICD-10-CM

## 2011-10-29 DIAGNOSIS — I82409 Acute embolism and thrombosis of unspecified deep veins of unspecified lower extremity: Secondary | ICD-10-CM | POA: Insufficient documentation

## 2011-10-29 DIAGNOSIS — Z01812 Encounter for preprocedural laboratory examination: Secondary | ICD-10-CM | POA: Insufficient documentation

## 2011-10-29 DIAGNOSIS — I803 Phlebitis and thrombophlebitis of lower extremities, unspecified: Secondary | ICD-10-CM

## 2011-10-29 DIAGNOSIS — C7931 Secondary malignant neoplasm of brain: Secondary | ICD-10-CM | POA: Insufficient documentation

## 2011-10-29 DIAGNOSIS — C3490 Malignant neoplasm of unspecified part of unspecified bronchus or lung: Secondary | ICD-10-CM | POA: Insufficient documentation

## 2011-10-29 LAB — BASIC METABOLIC PANEL
BUN: 7 mg/dL (ref 6–23)
CO2: 30 mEq/L (ref 19–32)
Calcium: 9.4 mg/dL (ref 8.4–10.5)
GFR calc non Af Amer: >90 mL/min (ref >90)
Glucose, Bld: 134 mg/dL — ABNORMAL HIGH (ref 70–99)
Potassium: 3.9 mEq/L (ref 3.5–5.1)

## 2011-10-29 LAB — CBC
Hemoglobin: 14.9 g/dL (ref 12.0–15.0)
MCH: 30.5 pg (ref 26.0–34.0)
MCHC: 33 g/dL (ref 30.0–36.0)
MCV: 92.2 fL (ref 78.0–100.0)
RBC: 4.89 MIL/uL (ref 3.87–5.11)

## 2011-10-29 LAB — PROTIME-INR: Prothrombin Time: 11.5 seconds — ABNORMAL LOW (ref 11.6–15.2)

## 2011-10-29 MED ORDER — LIDOCAINE HCL 1 % IJ SOLN
INTRAMUSCULAR | Status: AC
  Filled 2011-10-29: qty 20

## 2011-10-29 MED ORDER — FENTANYL CITRATE 0.05 MG/ML IJ SOLN
INTRAMUSCULAR | Status: AC
  Filled 2011-10-29: qty 4

## 2011-10-29 MED ORDER — FENTANYL CITRATE 0.05 MG/ML IJ SOLN
INTRAMUSCULAR | Status: AC
  Filled 2011-10-29: qty 2

## 2011-10-29 MED ORDER — IOHEXOL 300 MG/ML  SOLN: 35.0000 mL | Freq: Once | INTRAMUSCULAR | Status: AC | PRN

## 2011-10-29 MED ORDER — SODIUM CHLORIDE 0.9 % IV SOLN: INTRAVENOUS | Status: DC

## 2011-10-29 MED ORDER — HYDROCODONE-ACETAMINOPHEN 5-325 MG PO TABS: 1.0000 | ORAL_TABLET | ORAL | Status: DC | PRN

## 2011-10-29 MED ORDER — FENTANYL CITRATE 0.05 MG/ML IJ SOLN
INTRAMUSCULAR | Status: AC | PRN
  Administered 2011-10-29 (×2): 50 ug via INTRAVENOUS

## 2011-10-29 NOTE — Telephone Encounter (Signed)
Daughter LMOVM stating " We just found out that my mom has a blood clot in her left calf and is actually at [facility] getting a filter placed in that area.  The doctor said that she can not take blood thinners because of her previous brain surgery.  She was going to be scheduled to have a port placed and we would like to know if this changes things.  Mom was going to chemo class July 31 and was suppose to start chemo August 9.  Again we just wanted to know if this blood clot will change plans and what our plan is going forward? Do we proceed as before?"  Will review with MD

## 2011-10-29 NOTE — H&P (Signed)
Cc: Acute LLE DVT. Unable to be anti-coagulated at this time secondary to recent brain surgery for metastatic disease.  HPI : see oncology note below from patient's last office visit : Haley December, MD Physician Signed  Progress Notes 10/26/2011 11:10 AM  Related encounter: Office Visit from 10/26/2011 in [facility] MEDICAL ONCOLOGY   OFFICE PROGRESS NOTE  CC  Haley Smiles., MD [address] [place] [address] Dr. Shirlean Kelly Dr. Dorothy Puffer Dr. [name]  DIAGNOSIS: 56 year old female with metastatic adenocarcinoma with brain metastasis Unknown primary but possibly an upper GI source versus lung primary  PRIOR THERAPY:  #1 patient underwent resectionon 6/12 of brain met with the pathology showing a adenocarcinoma likely upper GI primary. Patient has also had SRS on 10/06/2011.  #2 patient has had multiple staging studies including PET CT which showed a 7 mm nodule in the right upper lobe which was not seen previously on CT of the chest. She subsequently was seen by Dr. Craige Cotta in pulmonary for possible bronchoscopy/biopsy.  #3 patient also has had consultation with Dr. [name] she has undergone endoscopy colonoscopy without any evidence of a primary. She has undergone capsule endoscopy results of which are pending. Patient also has had MRCP and that was negative.  #4 Patient to begin chemotherapy pending results of the capsule endoscopy. If the capsule endoscopy is negative she will begin chemotherapy consisting of Taxol carboplatinum given every 21 days for a total of 4-6 cycles.  CURRENT THERAPY: Carbo Taxol tentatively scheduled for 11/12/2011  INTERVAL HISTORY: Haley Lowe 56 y.o. female returns for Followup visit today. She has had SRS performed overall she tolerated it very well. She has also been seen by pulmonary for the 7 mm lung nodule for consideration of possible bronchoscopy. Patient also was evaluated by Dr. Dickie La and  has undergone an endoscopy and a colonoscopy without any finding of a primary malignancy. She had a capsule endoscopy performed yesterday results of which are pending. MRCP was negative for any evidence of primary in the liver or pancreas. Clinically patient seems to be doing well. She is status fatigued from having all the testing performed. She denies any headaches double vision blurring of vision she does continue to smoke and wants to quit as soon as possible she is using the vapor cigarettes for now. She denies any nausea vomiting no fevers chills night sweats she is eating well and she is ambulatory without any problems with her gait. She has her strength back in her arms. Remainder of the 10 point review of systems is negative.  MEDICAL HISTORY: Past Medical History   Diagnosis  Date   .  Hypertension     .  PONV (postoperative nausea and vomiting)     .  Anxiety     .  Depression     .  COPD (chronic obstructive pulmonary disease)     .  GERD (gastroesophageal reflux disease)     .  Headache     .  Arthritis     .  Adenocarcinoma  09/28/2011   .  Hx of adenomatous colonic polyps     .  Pulmonary nodules     .  Diabetes mellitus     .  Emphysema     .  Metastatic adenocarcinoma to brain  09/15/11       tumor resection path   .  Allergy         hips and  shoulders   .  Cataract         1 lens implaqnt right eye,intact on left eye   .  Goiter         nodule watching   .  Fibromyalgia         neuropathy tops of both feet     ALLERGIES:  is allergic to penicillins and codeine.  MEDICATIONS:  Present medications are reviewed and updated  SURGICAL HISTORY:   Past Surgical History   Procedure  Date   .  Abdominal hysterectomy  1996   .  Cholecystectomy  2000   .  Shoulder surgery  1998       right   .  Eye surgery  2001       vision correction   .  Foot surgery  2005       left   .  Craniotomy  09/15/2011       Procedure: CRANIOTOMY TUMOR EXCISION;  Surgeon: [name], MD;  Location: [facility];  Service: Neurosurgery;  Laterality: N/A;  Craniotomy resection of tumor with stealth   .  Cataract extraction  2011       with lens implant     REVIEW OF SYSTEMS:  Constitutional: positive for fatigue Ears, nose, mouth, throat, and face: negative Respiratory: negative Cardiovascular: negative Gastrointestinal: negative Genitourinary:negative Integument/breast: negative Hematologic/lymphatic: negative Musculoskeletal:positive for arthralgias Neurological: positive for weakness    PHYSICAL EXAMINATION: General appearance: alert, cooperative and appears older than stated age Resp: clear to auscultation bilaterally and normal percussion bilaterally Back: symmetric, no curvature. ROM normal. No CVA tenderness. Cardio: regularly irregular rhythm GI: soft, non-tender; bowel sounds normal; no masses,  no organomegaly Extremities: extremities normal, atraumatic, no cyanosis or edema Neurologic: Grossly normal  ECOG PERFORMANCE STATUS: 1 - Symptomatic but completely ambulatory  Blood pressure 108/69, pulse 73, temperature 98.5 F (36.9 C), temperature source Oral, height 5\' 2"  (1.575 m), weight 173 lb 14.4 oz (78.881 kg).  LABORATORY DATA: Lab Results   Component  Value  Date     WBC  14.3*  09/28/2011     HGB  14.7  09/28/2011     HCT  44.9  09/28/2011     MCV  89.2  09/28/2011     PLT  267  09/28/2011         Chemistry        Component  Value  Date/Time     NA  135  09/28/2011 1338     K  4.1  09/28/2011 1338     CL  100  09/28/2011 1338     CO2  26  09/28/2011 1338     BUN  19  09/28/2011 1338     CREATININE  0.68  09/28/2011 1338        Component  Value  Date/Time     CALCIUM  9.8  09/28/2011 1338     ALKPHOS  54  09/28/2011 1338     AST  10  09/28/2011 1338     ALT  15  09/28/2011 1338     BILITOT  0.4  09/28/2011 1338         RADIOGRAPHIC STUDIES:  Mr Haley Lowe ZO Contrast  10/01/2011  *RADIOLOGY REPORT*  Clinical Data: Metastatic  adenocarcinoma to the brain, unspecified site.  S R S targeting.  MRI HEAD WITHOUT AND WITH CONTRAST  Technique:  Multiplanar, multiecho pulse sequences of the brain and surrounding structures were obtained  according to standard protocol without and with intravenous contrast  Contrast: 15mL MULTIHANCE GADOBENATE DIMEGLUMINE 529 MG/ML IV SOLN  Comparison: Preoperative study 09/09/2011.  Findings: The patient is status post left posterior frontal craniotomy for piecemeal removal of a metastatic lesion, adenocarcinoma of unknown primary.  Mild restricted diffusion reflects postoperative change/hematoma.  Slight cerebral atrophy. Mild chronic microvascular ischemic change. Minimal surrounding vasogenic edema.  Thin section imaging post contrast reveals a lesion that appears to be solitary. The lesion is considerably decreased in size compared with the preoperative study, again roughly spherical with 9 mm diameter compared with 21 mm previously.  Intrinsic T1 shortening centrally representing postoperative hemorrhage is similar in signal intensity to post contrast enhancement on post infusion images. Subtraction sequence (series 101) shows ring-like enhancement, which suggests residual peripheral tumor inferiorly (images 122 - 127) as well as more superiorly and anteriorly (images 130-136). These areas of peripheral enhancement are unlikely to represent granulation tissue given the relatively short interval  since recent surgery (09/15/2011).  IMPRESSION: Solitary postoperative left posterior frontal metastatic lesion. Marked reduction in tumor size and surrounding vasogenic edema. Peripheral enhancement seen best on postcontrast subtraction images suggests residual tumor.  Original Report Authenticated By: Elsie Stain, M.D.   Mr 3d Recon At Scanner  10/16/2011  *RADIOLOGY REPORT*  Clinical Data:  Metastatic adenocarcinoma to the brain.  No known primary.  MRI ABDOMEN WITHOUT AND WITH CONTRAST (INCLUDING MRCP)   Technique:  Multiplanar multisequence MR imaging of the abdomen was performed both before and after the administration of intravenous contrast. Heavily T2-weighted images of the biliary and pancreatic ducts were obtained, and three-dimensional MRCP images were rendered by post processing.  Contrast: 15mL MULTIHANCE GADOBENATE DIMEGLUMINE 529 MG/ML IV SOLN  Comparison:  PET CT 09/30/2011.  CTs of the chest, abdomen and pelvis 09/09/2011.  Findings:  The gallbladder is surgically absent.  There is no intra or extrahepatic biliary dilatation.  There is no evidence of choledocholithiasis.  The pancreatic ductal anatomy is normal. There is no pancreatic ductal dilatation.  There is no evidence of pancreatic mass or surrounding fluid collection.  There are small cysts peripherally in the right hepatic lobe and in the caudate lobe.  No enhancing or otherwise suspicious liver lesions are present. There is mild hepatic steatosis.  There is a small accessory splenule.  The adrenal glands and kidneys appear normal.  There is no ascites, peritoneal nodularity or lymphadenopathy.  Aorto iliac atherosclerosis and ectasia appear stable.  No suspicious osseous findings are seen.  IMPRESSION:  1.  No evidence of primary malignancy within the abdomen. 2.  No evidence of metastatic disease within the abdomen. 3.  Small hepatic cysts and mild steatosis. 4.  No biliary dilatation status post cholecystectomy.  Original Report Authenticated By: Gerrianne Scale, M.D.   Nm Pet Image Initial (pi) Whole Body  09/30/2011  *RADIOLOGY REPORT*  Clinical Data:  Initial treatment strategy for metastatic intracranial adenocarcinoma.  Evaluate for primary.  NUCLEAR MEDICINE WHOLE BODY PET CT  Technique:  Technique:  17.1 mCi F-18 FDG was injected intravenously. CT data was obtained and used for attenuation correction and anatomic localization only.  (This was not acquired as a diagnostic CT examination.) Additional exam technical data entered on  technologist worksheet.  Comparison: Head CT of 09/13/2011.  Chest abdomen and pelvic CTs of 09/09/2011.  Findings: Head/neck: Area of photopenia within the left high frontal lobe which likely is postoperative.  Chest:  A focus of hypermetabolism within the anteromedial right upper  lobe.  This measures a S.U.V. max of 4.7 on image 110.  This is felt to correspond to a possible 7x10 mm nodule within the right upper lobe including on transverse image 25 of series 3 and sagittal image 49 of series 5 on the 09/09/2011 CT.  No abnormal thoracic nodal activity.  Abdomen/Pelvis:  Subtle hypermetabolism within the anterior most aspect of the left lobe of the liver.  This measures a S.U.V. max of 4.8 on image 148.  This may correspond to subtle hypoattenuation on image 48 series 2 on the 09/09/2011 CT.  [place]/Extremities: No abnormal hypermetabolism.  CT images performed for attenuation correction demonstrate surgical changes of left frontal parietal craniotomy.  No significant findings within the neck.  Chest findings deferred to recent diagnostic chest CT.  Moderate centrilobular emphysema.  Abdominal pelvic findings deferred to recent diagnostic CT. No acute superimposed process.  IMPRESSION:  1.  Possible subtle 7 mm hypermetabolic right upper lobe lung nodule.  This area is difficult to differentiate from adjacent sub tending vessels but is suspicious, especially on sagittal reformatted images from the 09/09/2011 CT. Otherwise, no primary malignancy identified.  Of note this lesion would not typically cause intracranial metastasis, given small size and lack of adenopathy. 2.  Subtle hypermetabolism in the anterior left liver lobe.  This may correspond to a vague wedge-shaped hypoattenuation on the recent CT.  Not typical in morphology for hepatic metastasis. Consider short-term CT follow-up versus further characterization with pre and post contrast abdominal MRI.  Original Report Authenticated By: Consuello Bossier, M.D.    Mm Digital Diagnostic Bilat  10/05/2011  *RADIOLOGY REPORT*  Clinical Data:  Metastatic neoplasm to the brain.  DIGITAL DIAGNOSTIC BILATERAL MAMMOGRAM WITH CAD  Comparison:  November 03, 2010, January 23, 2009, October 27, 2007  Findings:  CC and MLO views of bilateral breasts are submitted.  No suspicious abnormality is identified in both breasts. Mammographic images were processed with CAD.  IMPRESSION: Negative.  RECOMMENDATION: Routine screening mammogram in 1 year.  BI-RADS CATEGORY 1:  Negative.  Original Report Authenticated By: Sherian Rein, M.D.   Mr Abd W/wo Cm/mrcp  10/16/2011  *RADIOLOGY REPORT*  Clinical Data:  Metastatic adenocarcinoma to the brain.  No known primary.  MRI ABDOMEN WITHOUT AND WITH CONTRAST (INCLUDING MRCP)  Technique:  Multiplanar multisequence MR imaging of the abdomen was performed both before and after the administration of intravenous contrast. Heavily T2-weighted images of the biliary and pancreatic ducts were obtained, and three-dimensional MRCP images were rendered by post processing.  Contrast: 15mL MULTIHANCE GADOBENATE DIMEGLUMINE 529 MG/ML IV SOLN  Comparison:  PET CT 09/30/2011.  CTs of the chest, abdomen and pelvis 09/09/2011.  Findings:  The gallbladder is surgically absent.  There is no intra or extrahepatic biliary dilatation.  There is no evidence of choledocholithiasis.  The pancreatic ductal anatomy is normal. There is no pancreatic ductal dilatation.  There is no evidence of pancreatic mass or surrounding fluid collection.  There are small cysts peripherally in the right hepatic lobe and in the caudate lobe.  No enhancing or otherwise suspicious liver lesions are present. There is mild hepatic steatosis.  There is a small accessory splenule.  The adrenal glands and kidneys appear normal.  There is no ascites, peritoneal nodularity or lymphadenopathy.  Aorto iliac atherosclerosis and ectasia appear stable.  No suspicious osseous findings are seen.  IMPRESSION:  1.   No evidence of primary malignancy within the abdomen. 2.  No evidence of metastatic disease within the  abdomen. 3.  Small hepatic cysts and mild steatosis. 4.  No biliary dilatation status post cholecystectomy.  Original Report Authenticated By: Gerrianne Scale, M.D.     ASSESSMENT: 56 year old female with  #1 metastatic adenocarcinoma of unknown primary with brain metastasis. Patient is now status post  SRS. Post procedure she is doing quite well.  #2 we have performed extensive studies to try to elucidate the primary for at the adenocarcinoma unfortunately up-to-date all scans mammograms and procedures have been negative. Patient's PET scan did show a 7 millimeter nodule that had hypermetabolic activity concerning for a lung primary in this smoker.Further evaluation will be performed by pulmonary.  #3 in the meantime patient and family are quite anxious to get started with systemic therapy. We had an extensive discussion regarding the differential diagnosis and the possible primaries. Since patient is a smoker I do think that this may be a lung primary versus an upper GI although we have done an exhaustive workup for upper GI and a primary is not visible. I therefore have recommended treatment with systemic chemotherapy consisting of Taxol and carboplatinum which we would use for an unknown primary.   PLAN:   #1 patient will need a Port-A-Cath placed and I have ordered this. She will also need chemotherapy teaching class.  #2 we discussed risks and benefits of chemotherapy and the rationale. My plan is to treat her with Taxol and carboplatinum for 4-6 cycles. After we do 3 cycles of this combination we will plan on obtaining restaging studies specifically looking at the pulmonary nodule to see if there is any response.  #3 all questions were answered today patient and her family are quite satisfied with this plan of care.   All questions were answered. The patient knows to call the clinic  with any problems, questions or concerns. We can certainly see the patient much sooner if necessary.  I spent 30 minutes counseling the patient face to face. The total time spent in the appointment was 30 minutes.    Drue Second, MD Medical/Oncology [facility] [phone] (beeper) [phone] (Office)  10/26/2011, 11:11 AM      Examination for today's procedure :   No new changes with ROS from last office visit except LLE pain and new finding of DVT and some wheezing while on decadron.   Labs :Results for Haley, Lowe (MRN [id number]) as of 10/29/2011 12:10  Ref. Range 10/29/2011 11:19  Sodium Latest Range: 135-145 mEq/L 141  Potassium Latest Range: 3.5-5.1 mEq/L 3.9  Chloride Latest Range: 96-112 mEq/L 102  CO2 Latest Range: 19-32 mEq/L 30  BUN Latest Range: 6-23 mg/dL 7  Creat Latest Range: 0.50-1.10 mg/dL 6.57 (L)  Calcium Latest Range: 8.4-10.5 mg/dL 9.4  GFR calc non Af Amer Latest Range: >90 mL/min >90  GFR calc Af Amer Latest Range: >90 mL/min >90  Glucose Latest Range: 70-99 mg/dL 846 (H)  WBC Latest Range: 4.0-10.5 K/uL 7.6  RBC Latest Range: 3.87-5.11 MIL/uL 4.89  Hemoglobin Latest Range: 12.0-15.0 g/dL 96.2  HCT Latest Range: 36.0-46.0 % 45.1  MCV Latest Range: 78.0-100.0 fL 92.2  MCH Latest Range: 26.0-34.0 pg 30.5  MCHC Latest Range: 30.0-36.0 g/dL 95.2  RDW Latest Range: 11.5-15.5 % 16.6 (H)  Platelets Latest Range: 150-400 K/uL 118 (L)  Prothrombin Time Latest Range: 11.6-15.2 seconds 11.5 (L)  INR Latest Range: 0.00-1.49  0.82    Imaging : *RADIOLOGY REPORT*  Clinical Data: Lower extremity pain.  LEFT LOWER EXTREMITY VENOUS DUPLEX ULTRASOUND  Technique:  Gray-scale sonography with graded compression, as well  as color Doppler and duplex ultrasound were performed to evaluate  the deep venous system of the lower extremity from the level of the  common femoral vein through the popliteal and proximal calf veins.  Spectral Doppler was  utilized to evaluate flow at rest and with  distal augmentation maneuvers.  Comparison: None.  Findings: Occlusive thrombus is identified in the peroneal vein.  Normal compressibility of the common femoral, superficial femoral,  and popliteal veins is demonstrated, as well as the visualized  proximal calf veins. No filling defects to suggest DVT on  grayscale or color Doppler imaging. Doppler waveforms show normal  direction of venous flow, normal respiratory phasicity and response  to augmentation.  IMPRESSION:  Study is positive for calf pain thrombosis with clot identified in  the peroneal vein.  Original Report Authenticated By: Bernadene Bell. Maricela Curet, M.D.       Patient presents today with her husband for IVC filter placement secondary to acute onset of LLE DVT with dopplers revealing clot within the peroneal vein. Given recent craniotomy - patient is not able to be anti-coagulated.  This would be a permanent filter placement for this patient.  PE:  Alert, oriented, audible mild wheezing.  Lungs with decreased breath sounds bilaterally, CV: distant heart sounds without evidence of murmur or rub.  Craniotomy site clean and dry. Abd: obese, soft.   LLE with mild tenderness to posterior calf behind knee.  Small petechia noted along lateral calf. No significant edema noted. Airway assessment = 2.   Assessment /Plan : Procedure details, benefits and risks including but not limited to infection, bleeding, vessel damage, malpositioning of filter and complications with moderate sedation discussed with patient and her spouse who has POA.  All their questions answered to their satisfaction. Will proceed with filter once labs returned and if WNL.

## 2011-10-29 NOTE — H&P (Signed)
Agree with above.  Will proceed with placement of a retrievable IVC filter for PE prophylaxis.    Signed,  Sterling Big, MD Vascular & Interventional Radiologist [facility] Radiology

## 2011-10-29 NOTE — Procedures (Signed)
Interventional Radiology Procedure Note  Procedure: Placement of a retrievable IVC filter for PE prophylaxis Complications: No immediate Recommendations: - Bedrest x 2 hrs  Signed,  Sterling Big, MD Vascular & Interventional Radiologist [facility] Radiology

## 2011-10-29 NOTE — Telephone Encounter (Signed)
Spoke with daughter, per MD, patient can proceed with treatments and appointments as scheduled.  "My mom has had some new breathing issues since they found this clot in her leg and we were wondering if there could also be a clot in her lungs as well.  We spoke with the doctor that did the filter and asked if she needed a CT scan but he said we didn't because there wouldn't be anything they could do about it since she can't take blood thinners.  What is Dr. Milta Deiters opinion about this?   My mom will do whatever Dr. Welton Flakes thinks she should."  Advised daughter that I would discuss situation with Dr. Welton Flakes and return call.  Instructed daughter to take her mother immediately to the emergency room if there is any change in breathing, chest pain, dizziness.  Daughter expressed understanding and has no further questions.  Reviewed with MD  Per MD, patient to go to emergency room for further evaluation.  LMOVM with Dr. Milta Deiters instructions.

## 2011-11-02 ENCOUNTER — Other Ambulatory Visit (HOSPITAL_COMMUNITY): Payer: Self-pay | Admitting: Physician Assistant

## 2011-11-02 DIAGNOSIS — R0602 Shortness of breath: Secondary | ICD-10-CM

## 2011-11-02 DIAGNOSIS — R9389 Abnormal findings on diagnostic imaging of other specified body structures: Secondary | ICD-10-CM

## 2011-11-03 ENCOUNTER — Other Ambulatory Visit: Payer: BC Managed Care – PPO

## 2011-11-03 ENCOUNTER — Other Ambulatory Visit: Payer: Self-pay | Admitting: Radiology

## 2011-11-03 ENCOUNTER — Encounter: Payer: Self-pay | Admitting: *Deleted

## 2011-11-04 ENCOUNTER — Ambulatory Visit (HOSPITAL_COMMUNITY)
Admission: RE | Admit: 2011-11-04 | Discharge: 2011-11-04 | Disposition: A | Discharge status: Home / Self Care | Payer: BC Managed Care – PPO | Source: Ambulatory Visit | Attending: Physician Assistant | Admitting: Physician Assistant

## 2011-11-04 ENCOUNTER — Ambulatory Visit (HOSPITAL_COMMUNITY): Payer: BC Managed Care – PPO

## 2011-11-04 ENCOUNTER — Telehealth: Payer: Self-pay | Admitting: Medical Oncology

## 2011-11-04 DIAGNOSIS — I2699 Other pulmonary embolism without acute cor pulmonale: Secondary | ICD-10-CM

## 2011-11-04 DIAGNOSIS — Z9889 Other specified postprocedural states: Secondary | ICD-10-CM | POA: Insufficient documentation

## 2011-11-04 DIAGNOSIS — R0602 Shortness of breath: Secondary | ICD-10-CM | POA: Insufficient documentation

## 2011-11-04 HISTORY — DX: Other pulmonary embolism without acute cor pulmonale: I26.99

## 2011-11-04 MED ORDER — IOHEXOL 350 MG/ML SOLN
100.0000 mL | Freq: Once | INTRAVENOUS | Status: AC | PRN
  Administered 2011-11-04: 100 mL via INTRAVENOUS

## 2011-11-04 NOTE — Telephone Encounter (Signed)
Daughter Cordelia Pen called in regards to patient and recent CT scan done 8/1.  "Since mom now has blood clots in her lungs, is getting her port placed a good idea?'  Reviewed with Dr. Welton Flakes.  Cancel port placement and have patient come in to clinic tomorrow.  Scheduled patient for MD appointment 8/2 @ 0900, canceled PAC placement, notified IR of cancellation and notified daughter, Cordelia Pen of appointment and cancellation.  Understanding expressed and no questions at this time.

## 2011-11-05 ENCOUNTER — Telehealth: Payer: Self-pay | Admitting: Medical Oncology

## 2011-11-05 ENCOUNTER — Ambulatory Visit (HOSPITAL_BASED_OUTPATIENT_CLINIC_OR_DEPARTMENT_OTHER): Payer: BC Managed Care – PPO | Admitting: Oncology

## 2011-11-05 ENCOUNTER — Ambulatory Visit (HOSPITAL_COMMUNITY): Admission: RE | Admit: 2011-11-05 | Payer: BC Managed Care – PPO | Source: Ambulatory Visit

## 2011-11-05 VITALS — BP 111/69 | HR 83 | Temp 98.7°F | Resp 20 | Ht 62.0 in | Wt 181.5 lb

## 2011-11-05 DIAGNOSIS — C801 Malignant (primary) neoplasm, unspecified: Secondary | ICD-10-CM

## 2011-11-05 DIAGNOSIS — I82409 Acute embolism and thrombosis of unspecified deep veins of unspecified lower extremity: Secondary | ICD-10-CM

## 2011-11-05 DIAGNOSIS — I2699 Other pulmonary embolism without acute cor pulmonale: Secondary | ICD-10-CM

## 2011-11-05 DIAGNOSIS — C7931 Secondary malignant neoplasm of brain: Secondary | ICD-10-CM

## 2011-11-05 NOTE — Progress Notes (Signed)
OFFICE PROGRESS NOTE  CC  Haley Smiles., MD [address] [place] [address] Dr. Shirlean Kelly Dr. Dorothy Puffer Dr. [name]  DIAGNOSIS: 56 year old female with metastatic adenocarcinoma with brain metastasis Unknown primary but possibly an upper GI source versus lung primary  PRIOR THERAPY:  #1 patient underwent resectionon 6/12 of brain met with the pathology showing a adenocarcinoma likely upper GI primary. Patient has also had SRS on 10/06/2011.  #2 patient has had multiple staging studies including PET CT which showed a 7 mm nodule in the right upper lobe which was not seen previously on CT of the chest. She subsequently was seen by Dr. Craige Cotta in pulmonary for possible bronchoscopy/biopsy.  #3 patient also has had consultation with Dr. [name] she has undergone endoscopy colonoscopy without any evidence of a primary. She has undergone capsule endoscopy results of which are pending. Patient also has had MRCP and that was negative.  #4 Patient to begin chemotherapy pending results of the capsule endoscopy. If the capsule endoscopy is negative she will begin chemotherapy consisting of Taxol carboplatinum given every 21 days for a total of 4-6 cycles.  CURRENT THERAPY: Carbo Taxol tentatively scheduled for 11/12/2011  INTERVAL HISTORY: Haley Lowe 56 y.o. female returns for Followup visit today. Patient was seen urgently because of her having developed pulmonary embolism. She did have a IVC filter placed. She also has left lower extremity DVT. She is noticing swelling. The family is quite concerned about delaying of the chemotherapy and delay in the Port-A-Cath placement. We discussed this extensively today. I do think that there are risks involved in putting in a central line such as a porta cath or a PICC line. However we do need to get her started on systemic chemotherapy as well for her cancer. I did discuss with them the possibility of doing  peripheral IV access for Taxol and carboplatinum. Patient however is not that excited about doing that. I will speak to some of my colleagues to discuss the feasibility of possibility getting a PICC line in. MEDICAL HISTORY: Past Medical History  Diagnosis Date  . Hypertension   . PONV (postoperative nausea and vomiting)   . Anxiety   . Depression   . COPD (chronic obstructive pulmonary disease)   . GERD (gastroesophageal reflux disease)   . Headache   . Arthritis   . Adenocarcinoma 09/28/2011  . Hx of adenomatous colonic polyps   . Pulmonary nodules   . Diabetes mellitus   . Emphysema   . 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 Allergy     hips and shoulders  . Cataract     1 lens implaqnt right eye,intact on left eye  . Goiter     nodule watching  . Fibromyalgia     neuropathy tops of both feet    ALLERGIES:  is allergic to penicillins and codeine.  MEDICATIONS:  Present medications are reviewed and updated  SURGICAL HISTORY:  Past Surgical History  Procedure Date  . Abdominal hysterectomy 1996  . Cholecystectomy 2000  . Shoulder surgery 1998    right  . Eye surgery 2001    vision correction  . Foot surgery 2005    left  . Craniotomy 09/15/2011    Procedure: CRANIOTOMY TUMOR EXCISION;  Surgeon: [name], MD;  Location: [facility];  Service: Neurosurgery;  Laterality: N/A;  Craniotomy resection of tumor with stealth  . Cataract extraction 2011  with lens implant    ECOG PERFORMANCE STATUS: 1 - Symptomatic but completely ambulatory  Blood pressure 111/69, pulse 83, temperature 98.7 F (37.1 C), temperature source Oral, resp. rate 20, height 5\' 2"  (1.575 m), weight 181 lb 8 oz (82.328 kg).  LABORATORY DATA: Lab Results  Component Value Date   WBC 7.6 10/29/2011   HGB 14.9 10/29/2011   HCT 45.1 10/29/2011   MCV 92.2 10/29/2011   PLT 118* 10/29/2011      Chemistry      Component Value Date/Time   NA 141 10/29/2011  1119   K 3.9 10/29/2011 1119   CL 102 10/29/2011 1119   CO2 30 10/29/2011 1119   BUN 7 10/29/2011 1119   CREATININE 0.48* 10/29/2011 1119      Component Value Date/Time   CALCIUM 9.4 10/29/2011 1119   ALKPHOS 48 10/26/2011 1136   AST 12 10/26/2011 1136   ALT 28 10/26/2011 1136   BILITOT 0.4 10/26/2011 1136       RADIOGRAPHIC STUDIES:  Mr Laqueta Jean ZO Contrast  10/01/2011  *RADIOLOGY REPORT*  Clinical Data: Metastatic adenocarcinoma to the brain, unspecified site.  S R S targeting.  MRI HEAD WITHOUT AND WITH CONTRAST  Technique:  Multiplanar, multiecho pulse sequences of the brain and surrounding structures were obtained according to standard protocol without and with intravenous contrast  Contrast: 15mL MULTIHANCE GADOBENATE DIMEGLUMINE 529 MG/ML IV SOLN  Comparison: Preoperative study 09/09/2011.  Findings: The patient is status post left posterior frontal craniotomy for piecemeal removal of a metastatic lesion, adenocarcinoma of unknown primary.  Mild restricted diffusion reflects postoperative change/hematoma.  Slight cerebral atrophy. Mild chronic microvascular ischemic change. Minimal surrounding vasogenic edema.  Thin section imaging post contrast reveals a lesion that appears to be solitary. The lesion is considerably decreased in size compared with the preoperative study, again roughly spherical with 9 mm diameter compared with 21 mm previously.  Intrinsic T1 shortening centrally representing postoperative hemorrhage is similar in signal intensity to post contrast enhancement on post infusion images. Subtraction sequence (series 101) shows ring-like enhancement, which suggests residual peripheral tumor inferiorly (images 122 - 127) as well as more superiorly and anteriorly (images 130-136). These areas of peripheral enhancement are unlikely to represent granulation tissue given the relatively short interval  since recent surgery (09/15/2011).  IMPRESSION: Solitary postoperative left posterior frontal  metastatic lesion. Marked reduction in tumor size and surrounding vasogenic edema. Peripheral enhancement seen best on postcontrast subtraction images suggests residual tumor.  Original Report Authenticated By: Elsie Stain, M.D.   Mr 3d Recon At Scanner  10/16/2011  *RADIOLOGY REPORT*  Clinical Data:  Metastatic adenocarcinoma to the brain.  No known primary.  MRI ABDOMEN WITHOUT AND WITH CONTRAST (INCLUDING MRCP)  Technique:  Multiplanar multisequence MR imaging of the abdomen was performed both before and after the administration of intravenous contrast. Heavily T2-weighted images of the biliary and pancreatic ducts were obtained, and three-dimensional MRCP images were rendered by post processing.  Contrast: 15mL MULTIHANCE GADOBENATE DIMEGLUMINE 529 MG/ML IV SOLN  Comparison:  PET CT 09/30/2011.  CTs of the chest, abdomen and pelvis 09/09/2011.  Findings:  The gallbladder is surgically absent.  There is no intra or extrahepatic biliary dilatation.  There is no evidence of choledocholithiasis.  The pancreatic ductal anatomy is normal. There is no pancreatic ductal dilatation.  There is no evidence of pancreatic mass or surrounding fluid collection.  There are small cysts peripherally in the right hepatic lobe and in the caudate lobe.  No enhancing or otherwise suspicious liver lesions are present. There is mild hepatic steatosis.  There is a small accessory splenule.  The adrenal glands and kidneys appear normal.  There is no ascites, peritoneal nodularity or lymphadenopathy.  Aorto iliac atherosclerosis and ectasia appear stable.  No suspicious osseous findings are seen.  IMPRESSION:  1.  No evidence of primary malignancy within the abdomen. 2.  No evidence of metastatic disease within the abdomen. 3.  Small hepatic cysts and mild steatosis. 4.  No biliary dilatation status post cholecystectomy.  Original Report Authenticated By: Gerrianne Scale, M.D.   Nm Pet Image Initial (pi) Whole Body  09/30/2011   *RADIOLOGY REPORT*  Clinical Data:  Initial treatment strategy for metastatic intracranial adenocarcinoma.  Evaluate for primary.  NUCLEAR MEDICINE WHOLE BODY PET CT  Technique:  Technique:  17.1 mCi F-18 FDG was injected intravenously. CT data was obtained and used for attenuation correction and anatomic localization only.  (This was not acquired as a diagnostic CT examination.) Additional exam technical data entered on technologist worksheet.  Comparison: Head CT of 09/13/2011.  Chest abdomen and pelvic CTs of 09/09/2011.  Findings: Head/neck: Area of photopenia within the left high frontal lobe which likely is postoperative.  Chest:  A focus of hypermetabolism within the anteromedial right upper lobe.  This measures a S.U.V. max of 4.7 on image 110.  This is felt to correspond to a possible 7x10 mm nodule within the right upper lobe including on transverse image 25 of series 3 and sagittal image 49 of series 5 on the 09/09/2011 CT.  No abnormal thoracic nodal activity.  Abdomen/Pelvis:  Subtle hypermetabolism within the anterior most aspect of the left lobe of the liver.  This measures a S.U.V. max of 4.8 on image 148.  This may correspond to subtle hypoattenuation on image 48 series 2 on the 09/09/2011 CT.  [place]/Extremities:  No abnormal hypermetabolism.  CT images performed for attenuation correction demonstrate surgical changes of left frontal parietal craniotomy.  No significant findings within the neck.  Chest findings deferred to recent diagnostic chest CT.  Moderate centrilobular emphysema.  Abdominal pelvic findings deferred to recent diagnostic CT. No acute superimposed process.  IMPRESSION:  1.  Possible subtle 7 mm hypermetabolic right upper lobe lung nodule.  This area is difficult to differentiate from adjacent sub tending vessels but is suspicious, especially on sagittal reformatted images from the 09/09/2011 CT. Otherwise, no primary malignancy identified.  Of note this lesion would not  typically cause intracranial metastasis, given small size and lack of adenopathy. 2.  Subtle hypermetabolism in the anterior left liver lobe.  This may correspond to a vague wedge-shaped hypoattenuation on the recent CT.  Not typical in morphology for hepatic metastasis. Consider short-term CT follow-up versus further characterization with pre and post contrast abdominal MRI.  Original Report Authenticated By: Consuello Bossier, M.D.   Mm Digital Diagnostic Bilat  10/05/2011  *RADIOLOGY REPORT*  Clinical Data:  Metastatic neoplasm to the brain.  DIGITAL DIAGNOSTIC BILATERAL MAMMOGRAM WITH CAD  Comparison:  November 03, 2010, January 23, 2009, October 27, 2007  Findings:  CC and MLO views of bilateral breasts are submitted.  No suspicious abnormality is identified in both breasts. Mammographic images were processed with CAD.  IMPRESSION: Negative.  RECOMMENDATION: Routine screening mammogram in 1 year.  BI-RADS CATEGORY 1:  Negative.  Original Report Authenticated By: Sherian Rein, M.D.   Mr Abd W/wo Cm/mrcp  10/16/2011  *RADIOLOGY REPORT*  Clinical Data:  Metastatic adenocarcinoma  to the brain.  No known primary.  MRI ABDOMEN WITHOUT AND WITH CONTRAST (INCLUDING MRCP)  Technique:  Multiplanar multisequence MR imaging of the abdomen was performed both before and after the administration of intravenous contrast. Heavily T2-weighted images of the biliary and pancreatic ducts were obtained, and three-dimensional MRCP images were rendered by post processing.  Contrast: 15mL MULTIHANCE GADOBENATE DIMEGLUMINE 529 MG/ML IV SOLN  Comparison:  PET CT 09/30/2011.  CTs of the chest, abdomen and pelvis 09/09/2011.  Findings:  The gallbladder is surgically absent.  There is no intra or extrahepatic biliary dilatation.  There is no evidence of choledocholithiasis.  The pancreatic ductal anatomy is normal. There is no pancreatic ductal dilatation.  There is no evidence of pancreatic mass or surrounding fluid collection.  There are small  cysts peripherally in the right hepatic lobe and in the caudate lobe.  No enhancing or otherwise suspicious liver lesions are present. There is mild hepatic steatosis.  There is a small accessory splenule.  The adrenal glands and kidneys appear normal.  There is no ascites, peritoneal nodularity or lymphadenopathy.  Aorto iliac atherosclerosis and ectasia appear stable.  No suspicious osseous findings are seen.  IMPRESSION:  1.  No evidence of primary malignancy within the abdomen. 2.  No evidence of metastatic disease within the abdomen. 3.  Small hepatic cysts and mild steatosis. 4.  No biliary dilatation status post cholecystectomy.  Original Report Authenticated By: Gerrianne Scale, M.D.    ASSESSMENT: 56 year old female with  #1 metastatic adenocarcinoma of unknown primary with brain metastasis. Patient is now status post  SRS. Post procedure she is doing quite well.  #2 we have performed extensive studies to try to elucidate the primary for at the adenocarcinoma unfortunately up-to-date all scans mammograms and procedures have been negative. Patient's PET scan did show a 7 millimeter nodule that had hypermetabolic activity concerning for a lung primary in this smoker.Further evaluation will be performed by pulmonary.  #3 Patient is pulmonary embolism as well as left lower extremity DVT. She had a [place] filter placed just recently. She was scheduled to have a Port-A-Cath placed but due to the PE this is being held. Patient is not a candidate for anticoagulation do to the fact that she has recently had brain surgery performed for the metastasis.Patient does have a left lower extremity swelling.   PLAN:   #1We discussed today what this we could put taken but I will discuss it with one of my colleagues regarding the feasibility of getting a PICC line in. In the meantime I will proceed with the chemotherapy that is scheduled next week if we have to do a peripherally we will do so.  #2 patient  was given prescription for impression stockings she will get filled and start using them and to keep her feet elevated as much as he possibly can to prevent postphlebitic syndrome/swelling.  #3 patient will return in one week's time for followup and first cycle of chemotherapy.  All questions were answered. The patient knows to call the clinic with any problems, questions or concerns. We can certainly see the patient much sooner if necessary.  I spent 30 minutes counseling the patient face to face. The total time spent in the appointment was 30 minutes.    Haley Second, MD Medical/Oncology [facility] [phone] (beeper) [phone] (Office)  11/05/2011, 4:10 PM

## 2011-11-05 NOTE — Patient Instructions (Addendum)
We will proceed with chemotherapy next week

## 2011-11-05 NOTE — Telephone Encounter (Signed)
Compression stocking information needed; 1.  Knee vs thigh high ? 2.  what pressure mmg- 8-15 , 15-20, 20-30, 30-60. 3. Diagnosis Call Deanna Artis at [facility] [phone]

## 2011-11-08 ENCOUNTER — Encounter: Payer: Self-pay | Admitting: *Deleted

## 2011-11-08 NOTE — Progress Notes (Signed)
[facility] Psychosocial Distress Screening Clinical Social Work  Clinical Social Work was referred by distress screening protocol.  The patient scored an 8 on the Psychosocial Distress Thermometer which indicates moderate distress. Clinical Social Worker contacted pt at home to assess for distress and other psychosocial needs.  Pt stated she was doing "ok", and was not experiencing any distress associated with practical concerns.  When asked about her emotional health pt stated she was doing "so-so".  CSW informed pt of the support services and resources available at [facility].  Pt was open to CSW meeting with her during treatment on 11/12/11 to provided information and process any needs or concerns.  SCW encouraged pt to call with any additional questions or concerns.      Clinical Social Worker follow up needed: yes  If yes, follow up plan: Scheduled to meet and discuss support services on 11/12/11.   Tamala Julian, MSW, LCSW Clinical Social Worker [facility] [phone]

## 2011-11-10 ENCOUNTER — Telehealth: Payer: Self-pay | Admitting: *Deleted

## 2011-11-10 ENCOUNTER — Encounter: Payer: Self-pay | Admitting: Radiation Oncology

## 2011-11-10 MED ORDER — CITALOPRAM HYDROBROMIDE 10 MG PO TABS: 10.0000 mg | ORAL_TABLET | Freq: Every day | ORAL | Status: DC

## 2011-11-10 NOTE — Telephone Encounter (Signed)
Pt's daughter called states " Dr. Welton Flakes was going to check and see if my mom needed to go on Coumadin b/c she is 2months out from a brain injury. If getting a port or picc line was something we needed to have done.,and can Dr. Welton Flakes call in the citalopram 10mg ?"  Per Daughter, Pt has history of 2 PE's on right side. Will review with MD Pt's 1st tx Friday 8/9 Carbo/Taxol

## 2011-11-10 NOTE — Telephone Encounter (Signed)
Reviewed with MD. Maurene Capes, daughter-Pt will not start coumadin at this time. Treatment on Friday will be done via PIV. Going forward pt will have Picc line placed if not contraindicated due to pt's history of 2 PE's on right side. Cordelia Pen verbalized understanding. Denied needing further assistance.

## 2011-11-11 ENCOUNTER — Other Ambulatory Visit: Payer: Self-pay | Admitting: Radiation Therapy

## 2011-11-11 ENCOUNTER — Other Ambulatory Visit: Payer: Self-pay | Admitting: *Deleted

## 2011-11-11 ENCOUNTER — Telehealth: Payer: Self-pay | Admitting: Oncology

## 2011-11-11 DIAGNOSIS — C7931 Secondary malignant neoplasm of brain: Secondary | ICD-10-CM

## 2011-11-11 DIAGNOSIS — C801 Malignant (primary) neoplasm, unspecified: Secondary | ICD-10-CM

## 2011-11-11 NOTE — Telephone Encounter (Signed)
S/w kathy from [employer] regarding the pt needing a picc line placement. Per Olegario Messier she is waiting to hear from dr Welton Flakes to confirm the procedure the pt will be notified

## 2011-11-12 ENCOUNTER — Encounter: Payer: Self-pay | Admitting: *Deleted

## 2011-11-12 ENCOUNTER — Telehealth: Payer: Self-pay | Admitting: *Deleted

## 2011-11-12 ENCOUNTER — Encounter: Payer: Self-pay | Admitting: Radiation Oncology

## 2011-11-12 ENCOUNTER — Ambulatory Visit (HOSPITAL_BASED_OUTPATIENT_CLINIC_OR_DEPARTMENT_OTHER): Payer: BC Managed Care – PPO

## 2011-11-12 ENCOUNTER — Ambulatory Visit (HOSPITAL_BASED_OUTPATIENT_CLINIC_OR_DEPARTMENT_OTHER): Payer: BC Managed Care – PPO | Admitting: Oncology

## 2011-11-12 ENCOUNTER — Other Ambulatory Visit (HOSPITAL_BASED_OUTPATIENT_CLINIC_OR_DEPARTMENT_OTHER): Payer: BC Managed Care – PPO | Admitting: Lab

## 2011-11-12 ENCOUNTER — Ambulatory Visit
Admission: RE | Admit: 2011-11-12 | Discharge: 2011-11-12 | Disposition: A | Discharge status: Home / Self Care | Payer: BC Managed Care – PPO | Source: Ambulatory Visit | Attending: Radiation Oncology | Admitting: Radiation Oncology

## 2011-11-12 ENCOUNTER — Encounter: Payer: Self-pay | Admitting: Oncology

## 2011-11-12 VITALS — BP 104/68 | HR 73 | Temp 98.2°F | Resp 20

## 2011-11-12 VITALS — BP 112/80 | HR 81 | Temp 98.9°F | Resp 20 | Ht 62.0 in | Wt 178.0 lb

## 2011-11-12 VITALS — BP 101/70 | HR 73 | Temp 98.5°F | Resp 20 | Wt 177.6 lb

## 2011-11-12 DIAGNOSIS — C801 Malignant (primary) neoplasm, unspecified: Secondary | ICD-10-CM

## 2011-11-12 DIAGNOSIS — C7931 Secondary malignant neoplasm of brain: Secondary | ICD-10-CM

## 2011-11-12 DIAGNOSIS — R911 Solitary pulmonary nodule: Secondary | ICD-10-CM

## 2011-11-12 DIAGNOSIS — Z5111 Encounter for antineoplastic chemotherapy: Secondary | ICD-10-CM

## 2011-11-12 DIAGNOSIS — I2699 Other pulmonary embolism without acute cor pulmonale: Secondary | ICD-10-CM

## 2011-11-12 DIAGNOSIS — C7949 Secondary malignant neoplasm of other parts of nervous system: Secondary | ICD-10-CM

## 2011-11-12 DIAGNOSIS — R918 Other nonspecific abnormal finding of lung field: Secondary | ICD-10-CM

## 2011-11-12 LAB — COMPREHENSIVE METABOLIC PANEL
AST: 17 U/L (ref 0–37)
Albumin: 3.7 g/dL (ref 3.5–5.2)
Alkaline Phosphatase: 56 U/L (ref 39–117)
BUN: 13 mg/dL (ref 6–23)
Creatinine, Ser: 0.54 mg/dL (ref 0.50–1.10)
Glucose, Bld: 78 mg/dL (ref 70–99)
Potassium: 4.1 mEq/L (ref 3.5–5.3)
Total Bilirubin: 0.3 mg/dL (ref 0.3–1.2)

## 2011-11-12 LAB — CBC WITH DIFFERENTIAL/PLATELET
Basophils Absolute: 0.1 10*3/uL (ref 0.0–0.1)
EOS%: 0.7 % (ref 0.0–7.0)
Eosinophils Absolute: 0.1 10*3/uL (ref 0.0–0.5)
HCT: 40.5 % (ref 34.8–46.6)
HGB: 13.8 g/dL (ref 11.6–15.9)
LYMPH%: 18 % (ref 14.0–49.7)
MCH: 30.5 pg (ref 25.1–34.0)
MCV: 89.6 fL (ref 79.5–101.0)
MONO%: 13.9 % (ref 0.0–14.0)
NEUT#: 5.4 10*3/uL (ref 1.5–6.5)
NEUT%: 66.3 % (ref 38.4–76.8)
Platelets: 187 10*3/uL (ref 145–400)
RDW: 15.5 % — ABNORMAL HIGH (ref 11.2–14.5)

## 2011-11-12 MED ORDER — DEXAMETHASONE SODIUM PHOSPHATE 4 MG/ML IJ SOLN
20.0000 mg | Freq: Once | INTRAMUSCULAR | Status: AC
  Administered 2011-11-12: 20 mg via INTRAVENOUS

## 2011-11-12 MED ORDER — LORAZEPAM 2 MG/ML IJ SOLN
0.5000 mg | Freq: Once | INTRAMUSCULAR | Status: AC
  Administered 2011-11-12: 0.5 mg via INTRAVENOUS

## 2011-11-12 MED ORDER — ONDANSETRON 16 MG/50ML IVPB (CHCC)
16.0000 mg | Freq: Once | INTRAVENOUS | Status: AC
  Administered 2011-11-12: 16 mg via INTRAVENOUS

## 2011-11-12 MED ORDER — SODIUM CHLORIDE 0.9 % IV SOLN
Freq: Once | INTRAVENOUS | Status: AC
  Administered 2011-11-12: 11:00:00 via INTRAVENOUS

## 2011-11-12 MED ORDER — PACLITAXEL CHEMO INJECTION 300 MG/50ML
175.0000 mg/m2 | Freq: Once | INTRAVENOUS | Status: AC
  Administered 2011-11-12: 324 mg via INTRAVENOUS
  Filled 2011-11-12: qty 54

## 2011-11-12 MED ORDER — SODIUM CHLORIDE 0.9 % IV SOLN
750.0000 mg | Freq: Once | INTRAVENOUS | Status: AC
  Administered 2011-11-12: 750 mg via INTRAVENOUS
  Filled 2011-11-12: qty 75

## 2011-11-12 MED ORDER — DIPHENHYDRAMINE HCL 50 MG/ML IJ SOLN
50.0000 mg | Freq: Once | INTRAMUSCULAR | Status: AC
  Administered 2011-11-12: 50 mg via INTRAVENOUS

## 2011-11-12 MED ORDER — FAMOTIDINE IN NACL 20-0.9 MG/50ML-% IV SOLN
20.0000 mg | Freq: Once | INTRAVENOUS | Status: AC
  Administered 2011-11-12: 20 mg via INTRAVENOUS

## 2011-11-12 MED ORDER — CITALOPRAM HYDROBROMIDE 20 MG PO TABS: 20.0000 mg | ORAL_TABLET | Freq: Every day | ORAL | Status: DC

## 2011-11-12 NOTE — Patient Instructions (Addendum)
Proceed with chemotherapy today   Return in 1 week for follow up 

## 2011-11-12 NOTE — Patient Instructions (Addendum)
[facility] Discharge Instructions for Patients Receiving Chemotherapy  Today you received the following chemotherapy agents Taxol and Carboplatin.  To help prevent nausea and vomiting after your treatment, we encourage you to take your nausea medication Zofran and Compazine. Begin taking it at bedtime and take it as often as prescribed for the next 24-36 hours.   If you develop nausea and vomiting that is not controlled by your nausea medication, call the clinic. If it is after clinic hours call the after hours number for the clinic or go to the Emergency Department.   BELOW ARE SYMPTOMS THAT SHOULD BE REPORTED IMMEDIATELY:  *FEVER GREATER THAN 100.5 F  *CHILLS WITH OR WITHOUT FEVER  NAUSEA AND VOMITING THAT IS NOT CONTROLLED WITH YOUR NAUSEA MEDICATION  *UNUSUAL SHORTNESS OF BREATH  *UNUSUAL BRUISING OR BLEEDING  TENDERNESS IN MOUTH AND THROAT WITH OR WITHOUT PRESENCE OF ULCERS  *URINARY PROBLEMS  *BOWEL PROBLEMS  UNUSUAL RASH Items with * indicate a potential emergency and should be followed up as soon as possible.  One of the nurses will contact you 24 hours after your treatment. Please let the nurse know about any problems that you may have experienced. Feel free to call the clinic you have any questions or concerns. The clinic phone number is [phone].   I have been informed and understand all the instructions given to me. I know to contact the clinic, my physician, or go to the Emergency Department if any problems should occur. I do not have any questions at this time, but understand that I may call the clinic during office hours   should I have any questions or need assistance in obtaining follow up care.    __________________________________________  _____________  __________ Signature of Patient or Authorized Representative            Date                   Time    __________________________________________ Nurse's Signature  Carboplatin  injection What is this medicine? CARBOPLATIN (KAR boe pla tin) is a chemotherapy drug. It targets fast dividing cells, like cancer cells, and causes these cells to die. This medicine is used to treat ovarian cancer and many other cancers. This medicine may be used for other purposes; ask your health care provider or pharmacist if you have questions. What should I tell my health care provider before I take this medicine? They need to know if you have any of these conditions: -blood disorders -hearing problems -kidney disease -recent or ongoing radiation therapy -an unusual or allergic reaction to carboplatin, cisplatin, other chemotherapy, other medicines, foods, dyes, or preservatives -pregnant or trying to get pregnant -breast-feeding How should I use this medicine? This drug is usually given as an infusion into a vein. It is administered in a hospital or clinic by a specially trained health care professional. Talk to your pediatrician regarding the use of this medicine in children. Special care may be needed. Overdosage: If you think you have taken too much of this medicine contact a poison control center or emergency room at once. NOTE: This medicine is only for you. Do not share this medicine with others. What if I miss a dose? It is important not to miss a dose. Call your doctor or health care professional if you are unable to keep an appointment. What may interact with this medicine? -medicines for seizures -medicines to increase blood counts like filgrastim, pegfilgrastim, sargramostim -some antibiotics like amikacin, gentamicin,  neomycin, streptomycin, tobramycin -vaccines Talk to your doctor or health care professional before taking any of these medicines: -acetaminophen -aspirin -ibuprofen -ketoprofen -naproxen This list may not describe all possible interactions. Give your health care provider a list of all the medicines, herbs, non-prescription drugs, or dietary supplements  you use. Also tell them if you smoke, drink alcohol, or use illegal drugs. Some items may interact with your medicine. What should I watch for while using this medicine? Your condition will be monitored carefully while you are receiving this medicine. You will need important blood work done while you are taking this medicine. This drug may make you feel generally unwell. This is not uncommon, as chemotherapy can affect healthy cells as well as cancer cells. Report any side effects. Continue your course of treatment even though you feel ill unless your doctor tells you to stop. In some cases, you may be given additional medicines to help with side effects. Follow all directions for their use. Call your doctor or health care professional for advice if you get a fever, chills or sore throat, or other symptoms of a cold or flu. Do not treat yourself. This drug decreases your body's ability to fight infections. Try to avoid being around people who are sick. This medicine may increase your risk to bruise or bleed. Call your doctor or health care professional if you notice any unusual bleeding. Be careful brushing and flossing your teeth or using a toothpick because you may get an infection or bleed more easily. If you have any dental work done, tell your dentist you are receiving this medicine. Avoid taking products that contain aspirin, acetaminophen, ibuprofen, naproxen, or ketoprofen unless instructed by your doctor. These medicines may hide a fever. Do not become pregnant while taking this medicine. Women should inform their doctor if they wish to become pregnant or think they might be pregnant. There is a potential for serious side effects to an unborn child. Talk to your health care professional or pharmacist for more information. Do not breast-feed an infant while taking this medicine. What side effects may I notice from receiving this medicine? Side effects that you should report to your doctor or  health care professional as soon as possible: -allergic reactions like skin rash, itching or hives, swelling of the face, lips, or tongue -signs of infection - fever or chills, cough, sore throat, pain or difficulty passing urine -signs of decreased platelets or bleeding - bruising, pinpoint red spots on the skin, black, tarry stools, nosebleeds -signs of decreased red blood cells - unusually weak or tired, fainting spells, lightheadedness -breathing problems -changes in hearing -changes in vision -chest pain -high blood pressure -low blood counts - This drug may decrease the number of white blood cells, red blood cells and platelets. You may be at increased risk for infections and bleeding. -nausea and vomiting -pain, swelling, redness or irritation at the injection site -pain, tingling, numbness in the hands or feet -problems with balance, talking, walking -trouble passing urine or change in the amount of urine Side effects that usually do not require medical attention (report to your doctor or health care professional if they continue or are bothersome): -hair loss -loss of appetite -metallic taste in the mouth or changes in taste This list may not describe all possible side effects. Call your doctor for medical advice about side effects. You may report side effects to FDA at 1-800-FDA-1088. Where should I keep my medicine? This drug is given in a hospital or clinic  and will not be stored at home. NOTE: This sheet is a summary. It may not cover all possible information. If you have questions about this medicine, talk to your doctor, pharmacist, or health care provider.  2012, [employer]. (06/27/2007 2:38:05 PM)  Paclitaxel injection What is this medicine? PACLITAXEL (PAK li TAX el) is a chemotherapy drug. It targets fast dividing cells, like cancer cells, and causes these cells to die. This medicine is used to treat ovarian cancer, breast cancer, and other cancers. This  medicine may be used for other purposes; ask your health care provider or pharmacist if you have questions. What should I tell my health care provider before I take this medicine? They need to know if you have any of these conditions: -blood disorders -irregular heartbeat -infection (especially a virus infection such as chickenpox, cold sores, or herpes) -liver disease -previous or ongoing radiation therapy -an unusual or allergic reaction to paclitaxel, alcohol, polyoxyethylated castor oil, other chemotherapy agents, other medicines, foods, dyes, or preservatives -pregnant or trying to get pregnant -breast-feeding How should I use this medicine? This drug is given as an infusion into a vein. It is administered in a hospital or clinic by a specially trained health care professional. Talk to your pediatrician regarding the use of this medicine in children. Special care may be needed. Overdosage: If you think you have taken too much of this medicine contact a poison control center or emergency room at once. NOTE: This medicine is only for you. Do not share this medicine with others. What if I miss a dose? It is important not to miss your dose. Call your doctor or health care professional if you are unable to keep an appointment. What may interact with this medicine? Do not take this medicine with any of the following medications: -disulfiram -metronidazole This medicine may also interact with the following medications: -cyclosporine -dexamethasone -diazepam -ketoconazole -medicines to increase blood counts like filgrastim, pegfilgrastim, sargramostim -other chemotherapy drugs like cisplatin, doxorubicin, epirubicin, etoposide, teniposide, vincristine -quinidine -testosterone -vaccines -verapamil Talk to your doctor or health care professional before taking any of these medicines: -acetaminophen -aspirin -ibuprofen -ketoprofen -naproxen This list may not describe all possible  interactions. Give your health care provider a list of all the medicines, herbs, non-prescription drugs, or dietary supplements you use. Also tell them if you smoke, drink alcohol, or use illegal drugs. Some items may interact with your medicine. What should I watch for while using this medicine? Your condition will be monitored carefully while you are receiving this medicine. You will need important blood work done while you are taking this medicine. This drug may make you feel generally unwell. This is not uncommon, as chemotherapy can affect healthy cells as well as cancer cells. Report any side effects. Continue your course of treatment even though you feel ill unless your doctor tells you to stop. In some cases, you may be given additional medicines to help with side effects. Follow all directions for their use. Call your doctor or health care professional for advice if you get a fever, chills or sore throat, or other symptoms of a cold or flu. Do not treat yourself. This drug decreases your body's ability to fight infections. Try to avoid being around people who are sick. This medicine may increase your risk to bruise or bleed. Call your doctor or health care professional if you notice any unusual bleeding. Be careful brushing and flossing your teeth or using a toothpick because you may get an infection  or bleed more easily. If you have any dental work done, tell your dentist you are receiving this medicine. Avoid taking products that contain aspirin, acetaminophen, ibuprofen, naproxen, or ketoprofen unless instructed by your doctor. These medicines may hide a fever. Do not become pregnant while taking this medicine. Women should inform their doctor if they wish to become pregnant or think they might be pregnant. There is a potential for serious side effects to an unborn child. Talk to your health care professional or pharmacist for more information. Do not breast-feed an infant while taking this  medicine. Men are advised not to father a child while receiving this medicine. What side effects may I notice from receiving this medicine? Side effects that you should report to your doctor or health care professional as soon as possible: -allergic reactions like skin rash, itching or hives, swelling of the face, lips, or tongue -low blood counts - This drug may decrease the number of white blood cells, red blood cells and platelets. You may be at increased risk for infections and bleeding. -signs of infection - fever or chills, cough, sore throat, pain or difficulty passing urine -signs of decreased platelets or bleeding - bruising, pinpoint red spots on the skin, black, tarry stools, nosebleeds -signs of decreased red blood cells - unusually weak or tired, fainting spells, lightheadedness -breathing problems -chest pain -high or low blood pressure -mouth sores -nausea and vomiting -pain, swelling, redness or irritation at the injection site  Ondansetron tablets What is this medicine? ONDANSETRON (on DAN se tron) is used to treat nausea and vomiting caused by chemotherapy. It is also used to prevent or treat nausea and vomiting after surgery. This medicine may be used for other purposes; ask your health care provider or pharmacist if you have questions. What should I tell my health care provider before I take this medicine? They need to know if you have any of these conditions: -heart disease -history of irregular heartbeat -liver disease -low levels of magnesium or potassium in the blood -an unusual or allergic reaction to ondansetron, granisetron, other medicines, foods, dyes, or preservatives -pregnant or trying to get pregnant -breast-feeding How should I use this medicine? Take this medicine by mouth with a glass of water. Follow the directions on your prescription label. Take your doses at regular intervals. Do not take your medicine more often than directed. Talk to your  pediatrician regarding the use of this medicine in children. Special care may be needed. Overdosage: If you think you have taken too much of this medicine contact a poison control center or emergency room at once. NOTE: This medicine is only for you. Do not share this medicine with others. What if I miss a dose? If you miss a dose, take it as soon as you can. If it is almost time for your next dose, take only that dose. Do not take double or extra doses. What may interact with this medicine? Do not take this medicine with any of the following medications: -apomorphine  This medicine may also interact with the following medications: -carbamazepine -phenytoin -rifampicin -tramadol This list may not describe all possible interactions. Give your health care provider a list of all the medicines, herbs, non-prescription drugs, or dietary supplements you use. Also tell them if you smoke, drink alcohol, or use illegal drugs. Some items may interact with your medicine. What should I watch for while using this medicine? Check with your doctor or health care professional right away if you have any sign  of an allergic reaction. What side effects may I notice from receiving this medicine? Side effects that you should report to your doctor or health care professional as soon as possible: -allergic reactions like skin rash, itching or hives, swelling of the face, lips or tongue -breathing problems -dizziness -fast or irregular heartbeat -feeling faint or lightheaded, falls -fever and chills -swelling of the hands or feet -tightness in the chest Side effects that usually do not require medical attention (report to your doctor or health care professional if they continue or are bothersome): -constipation or diarrhea -headache This list may not describe all possible side effects. Call your doctor for medical advice about side effects. You may report side effects to FDA at 1-800-FDA-1088. Where should I  keep my medicine? Keep out of the reach of children. Store between 2 and 30 degrees C (36 and 86 degrees F). Throw away any unused medicine after the expiration date. NOTE: This sheet is a summary. It may not cover all possible information. If you have questions about this medicine, talk to your doctor, pharmacist, or health care provider.  2012, [employer]. (12/26/2009 11:18:31 AM) -pain, tingling, numbness in the hands or feet -slow or irregular heartbeat -swelling of the ankle, feet, hands Side effects that usually do not require medical attention (report to your doctor or health care professional if they continue or are bothersome): -bone pain -complete hair loss including hair on your head, underarms, pubic hair, eyebrows, and eyelashes -changes in the color of fingernails -diarrhea -loosening of the fingernails -loss of appetite -muscle or joint pain -red flush to skin -sweating This list may not describe all possible side effects. Call your doctor for medical advice about side effects. You may report side effects to FDA at 1-800-FDA-1088. Where should I keep my medicine? This drug is given in a hospital or clinic and will not be stored at home. NOTE: This sheet is a summary. It may not cover all possible information. If you have questions about this medicine, talk to your doctor, pharmacist, or health care provider.  2012, [employer] Standard. (03/04/2008 11:54:26 AM)

## 2011-11-12 NOTE — Telephone Encounter (Signed)
[facility] faxed request for citalopram dose.  Patient says it should be 20 mg.  Verbal order received and read back from Dr. Welton Flakes to increase this to 20 mg.  Called pharmacy and Harvie Heck given this change authorization and no refills at this time.

## 2011-11-12 NOTE — Progress Notes (Signed)
OFFICE PROGRESS NOTE  CC  [name], MD [address] [place] [address] Dr. Shirlean Kelly Dr. Dorothy Puffer Dr. [name]  DIAGNOSIS: 56 year old female with metastatic adenocarcinoma with brain metastasis Unknown primary but possibly an upper GI source versus lung primary  PRIOR THERAPY:  #1 patient underwent resection on 6/12 of brain met with the pathology showing a adenocarcinoma likely upper GI primary. Patient has also had SRS on 10/06/2011.  #2 patient has had multiple staging studies including PET CT which showed a 7 mm nodule in the right upper lobe which was not seen previously on CT of the chest. She subsequently was seen by Dr. Craige Cotta in pulmonary for possible bronchoscopy/biopsy.  #3 patient also has had consultation with Dr. [name] she has undergone endoscopy colonoscopy without any evidence of a primary. She has undergone capsule endoscopy results of which are pending. Patient also has had MRCP and that was negative.  #4 Patient to begin chemotherapy pending results of the capsule endoscopy. If the capsule endoscopy is negative she will begin chemotherapy consisting of Taxol carboplatinum given every 21 days for a total of 4-6 cycles.  CURRENT THERAPY: Carbo Taxol cycle 1 today INTERVAL HISTORY: SHONICE WRISLEY 55 y.o. female returns for Followup visit today. Today she is scheduled for chemotherapy. She will begin carboplatinum and Taxol. Since we do not have a central line we will try to get this up peripherally. She will receive day 2 Neulasta. Overall she feels okay she is tired and fatigued and of course she is very anxious about the chemotherapy today. Patient has been try to keep her feet up and her legs are less swollen. She otherwise denies any nausea or vomiting no fevers or chills.  MEDICAL HISTORY: Past Medical History  Diagnosis Date  . Hypertension   . PONV (postoperative nausea and vomiting)   . Anxiety   . Depression     . COPD (chronic obstructive pulmonary disease)   . GERD (gastroesophageal reflux disease)   . Headache   . Arthritis   . Adenocarcinoma 09/28/2011  . Hx of adenomatous colonic polyps   . Pulmonary nodules   . Diabetes mellitus   . Emphysema   . 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 Allergy     hips and shoulders  . Cataract     1 lens implaqnt right eye,intact on left eye  . Goiter     nodule watching  . Fibromyalgia     neuropathy tops of both feet  . Metastasis to brain 09/15/11     left frontoparietal region  . History of radiation therapy 10/06/11    1 fraction  15 gray  . Pulmonary emboli 11/04/11    right upper lobe and r lower lobe PE  . Lung nodule seen on imaging study 11/04/11 CT    4mm LLL    ALLERGIES:  is allergic to penicillins and codeine.  MEDICATIONS:  Present medications are reviewed and updated  SURGICAL HISTORY:  Past Surgical History  Procedure Date  . Abdominal hysterectomy 1996  . Cholecystectomy 2000  . Shoulder surgery 1998    right  . Eye surgery 2001    vision correction  . Foot surgery 2005    left  . Craniotomy 09/15/2011    Procedure: CRANIOTOMY TUMOR EXCISION;  Surgeon: [name], MD;  Location: [facility];  Service: Neurosurgery;  Laterality: N/A;  Craniotomy resection of tumor with stealth  .  Cataract extraction 2011    with lens implant    ECOG PERFORMANCE STATUS: 1 - Symptomatic but completely ambulatory  Blood pressure 112/80, pulse 81, temperature 98.9 F (37.2 C), temperature source Oral, resp. rate 20, height 5\' 2"  (1.575 m), weight 178 lb (80.74 kg).  LABORATORY DATA: Lab Results  Component Value Date   WBC 8.2 11/12/2011   HGB 13.8 11/12/2011   HCT 40.5 11/12/2011   MCV 89.6 11/12/2011   PLT 187 11/12/2011      Chemistry      Component Value Date/Time   NA 141 10/29/2011 1119   K 3.9 10/29/2011 1119   CL 102 10/29/2011 1119   CO2 30 10/29/2011 1119   BUN 7 10/29/2011 1119    CREATININE 0.48* 10/29/2011 1119      Component Value Date/Time   CALCIUM 9.4 10/29/2011 1119   ALKPHOS 48 10/26/2011 1136   AST 12 10/26/2011 1136   ALT 28 10/26/2011 1136   BILITOT 0.4 10/26/2011 1136       RADIOGRAPHIC STUDIES:  Mr Laqueta Jean ZO Contrast  10/01/2011  *RADIOLOGY REPORT*  Clinical Data: Metastatic adenocarcinoma to the brain, unspecified site.  S R S targeting.  MRI HEAD WITHOUT AND WITH CONTRAST  Technique:  Multiplanar, multiecho pulse sequences of the brain and surrounding structures were obtained according to standard protocol without and with intravenous contrast  Contrast: 15mL MULTIHANCE GADOBENATE DIMEGLUMINE 529 MG/ML IV SOLN  Comparison: Preoperative study 09/09/2011.  Findings: The patient is status post left posterior frontal craniotomy for piecemeal removal of a metastatic lesion, adenocarcinoma of unknown primary.  Mild restricted diffusion reflects postoperative change/hematoma.  Slight cerebral atrophy. Mild chronic microvascular ischemic change. Minimal surrounding vasogenic edema.  Thin section imaging post contrast reveals a lesion that appears to be solitary. The lesion is considerably decreased in size compared with the preoperative study, again roughly spherical with 9 mm diameter compared with 21 mm previously.  Intrinsic T1 shortening centrally representing postoperative hemorrhage is similar in signal intensity to post contrast enhancement on post infusion images. Subtraction sequence (series 101) shows ring-like enhancement, which suggests residual peripheral tumor inferiorly (images 122 - 127) as well as more superiorly and anteriorly (images 130-136). These areas of peripheral enhancement are unlikely to represent granulation tissue given the relatively short interval  since recent surgery (09/15/2011).  IMPRESSION: Solitary postoperative left posterior frontal metastatic lesion. Marked reduction in tumor size and surrounding vasogenic edema. Peripheral enhancement  seen best on postcontrast subtraction images suggests residual tumor.  Original Report Authenticated By: Elsie Stain, M.D.   Mr 3d Recon At Scanner  10/16/2011  *RADIOLOGY REPORT*  Clinical Data:  Metastatic adenocarcinoma to the brain.  No known primary.  MRI ABDOMEN WITHOUT AND WITH CONTRAST (INCLUDING MRCP)  Technique:  Multiplanar multisequence MR imaging of the abdomen was performed both before and after the administration of intravenous contrast. Heavily T2-weighted images of the biliary and pancreatic ducts were obtained, and three-dimensional MRCP images were rendered by post processing.  Contrast: 15mL MULTIHANCE GADOBENATE DIMEGLUMINE 529 MG/ML IV SOLN  Comparison:  PET CT 09/30/2011.  CTs of the chest, abdomen and pelvis 09/09/2011.  Findings:  The gallbladder is surgically absent.  There is no intra or extrahepatic biliary dilatation.  There is no evidence of choledocholithiasis.  The pancreatic ductal anatomy is normal. There is no pancreatic ductal dilatation.  There is no evidence of pancreatic mass or surrounding fluid collection.  There are small cysts peripherally in the right hepatic lobe and in  the caudate lobe.  No enhancing or otherwise suspicious liver lesions are present. There is mild hepatic steatosis.  There is a small accessory splenule.  The adrenal glands and kidneys appear normal.  There is no ascites, peritoneal nodularity or lymphadenopathy.  Aorto iliac atherosclerosis and ectasia appear stable.  No suspicious osseous findings are seen.  IMPRESSION:  1.  No evidence of primary malignancy within the abdomen. 2.  No evidence of metastatic disease within the abdomen. 3.  Small hepatic cysts and mild steatosis. 4.  No biliary dilatation status post cholecystectomy.  Original Report Authenticated By: Gerrianne Scale, M.D.   Nm Pet Image Initial (pi) Whole Body  09/30/2011  *RADIOLOGY REPORT*  Clinical Data:  Initial treatment strategy for metastatic intracranial  adenocarcinoma.  Evaluate for primary.  NUCLEAR MEDICINE WHOLE BODY PET CT  Technique:  Technique:  17.1 mCi F-18 FDG was injected intravenously. CT data was obtained and used for attenuation correction and anatomic localization only.  (This was not acquired as a diagnostic CT examination.) Additional exam technical data entered on technologist worksheet.  Comparison: Head CT of 09/13/2011.  Chest abdomen and pelvic CTs of 09/09/2011.  Findings: Head/neck: Area of photopenia within the left high frontal lobe which likely is postoperative.  Chest:  A focus of hypermetabolism within the anteromedial right upper lobe.  This measures a S.U.V. max of 4.7 on image 110.  This is felt to correspond to a possible 7x10 mm nodule within the right upper lobe including on transverse image 25 of series 3 and sagittal image 49 of series 5 on the 09/09/2011 CT.  No abnormal thoracic nodal activity.  Abdomen/Pelvis:  Subtle hypermetabolism within the anterior most aspect of the left lobe of the liver.  This measures a S.U.V. max of 4.8 on image 148.  This may correspond to subtle hypoattenuation on image 48 series 2 on the 09/09/2011 CT.  [place]/Extremities:  No abnormal hypermetabolism.  CT images performed for attenuation correction demonstrate surgical changes of left frontal parietal craniotomy.  No significant findings within the neck.  Chest findings deferred to recent diagnostic chest CT.  Moderate centrilobular emphysema.  Abdominal pelvic findings deferred to recent diagnostic CT. No acute superimposed process.  IMPRESSION:  1.  Possible subtle 7 mm hypermetabolic right upper lobe lung nodule.  This area is difficult to differentiate from adjacent sub tending vessels but is suspicious, especially on sagittal reformatted images from the 09/09/2011 CT. Otherwise, no primary malignancy identified.  Of note this lesion would not typically cause intracranial metastasis, given small size and lack of adenopathy. 2.  Subtle  hypermetabolism in the anterior left liver lobe.  This may correspond to a vague wedge-shaped hypoattenuation on the recent CT.  Not typical in morphology for hepatic metastasis. Consider short-term CT follow-up versus further characterization with pre and post contrast abdominal MRI.  Original Report Authenticated By: Consuello Bossier, M.D.   Mm Digital Diagnostic Bilat  10/05/2011  *RADIOLOGY REPORT*  Clinical Data:  Metastatic neoplasm to the brain.  DIGITAL DIAGNOSTIC BILATERAL MAMMOGRAM WITH CAD  Comparison:  November 03, 2010, January 23, 2009, October 27, 2007  Findings:  CC and MLO views of bilateral breasts are submitted.  No suspicious abnormality is identified in both breasts. Mammographic images were processed with CAD.  IMPRESSION: Negative.  RECOMMENDATION: Routine screening mammogram in 1 year.  BI-RADS CATEGORY 1:  Negative.  Original Report Authenticated By: Sherian Rein, M.D.   Mr Abd W/wo Cm/mrcp  10/16/2011  *RADIOLOGY REPORT*  Clinical  Data:  Metastatic adenocarcinoma to the brain.  No known primary.  MRI ABDOMEN WITHOUT AND WITH CONTRAST (INCLUDING MRCP)  Technique:  Multiplanar multisequence MR imaging of the abdomen was performed both before and after the administration of intravenous contrast. Heavily T2-weighted images of the biliary and pancreatic ducts were obtained, and three-dimensional MRCP images were rendered by post processing.  Contrast: 15mL MULTIHANCE GADOBENATE DIMEGLUMINE 529 MG/ML IV SOLN  Comparison:  PET CT 09/30/2011.  CTs of the chest, abdomen and pelvis 09/09/2011.  Findings:  The gallbladder is surgically absent.  There is no intra or extrahepatic biliary dilatation.  There is no evidence of choledocholithiasis.  The pancreatic ductal anatomy is normal. There is no pancreatic ductal dilatation.  There is no evidence of pancreatic mass or surrounding fluid collection.  There are small cysts peripherally in the right hepatic lobe and in the caudate lobe.  No enhancing or  otherwise suspicious liver lesions are present. There is mild hepatic steatosis.  There is a small accessory splenule.  The adrenal glands and kidneys appear normal.  There is no ascites, peritoneal nodularity or lymphadenopathy.  Aorto iliac atherosclerosis and ectasia appear stable.  No suspicious osseous findings are seen.  IMPRESSION:  1.  No evidence of primary malignancy within the abdomen. 2.  No evidence of metastatic disease within the abdomen. 3.  Small hepatic cysts and mild steatosis. 4.  No biliary dilatation status post cholecystectomy.  Original Report Authenticated By: Gerrianne Scale, M.D.    ASSESSMENT: 56 year old female with  #1 metastatic adenocarcinoma of unknown primary with brain metastasis. Patient is now status post  SRS. Post procedure she is doing quite well.  #2 we have performed extensive studies to try to elucidate the primary for at the adenocarcinoma unfortunately up-to-date all scans mammograms and procedures have been negative. Patient's PET scan did show a 7 millimeter nodule that had hypermetabolic activity concerning for a lung primary in this smoker.Further evaluation will be performed by pulmonary.  #3 Patient is pulmonary embolism as well as left lower extremity DVT. She had a [place] filter placed just recently. Patient will eventually need anticoagulation. However she also needs a Port-A-Cath placed and we will hold on starting her on anticoagulation until she has a port placed. We hopefully can get the port placed prior to her cycle 2 of chemotherapy.  PLAN:   #1We will proceed with scheduled chemotherapy today peripherally. Risks and benefits of treatment were discussed with the patient. She has all the anti-emetics.  #2 patient will need a Port-A-Cath placed prior to cycle 2 of her chemotherapy. After which we will start her on anticoagulation.  #3 I have discussed this case with one of my colleagues Dr.Granfortuna regarding anticoagulation in the  setting of pulmonary embolism and recent range surgery. Patient's case was also discussed with Dr. Newell Coral.  All questions were answered. The patient knows to call the clinic with any problems, questions or concerns. We can certainly see the patient much sooner if necessary.  I spent 30 minutes counseling the patient face to face. The total time spent in the appointment was 30 minutes.    Drue Second, MD Medical/Oncology [facility] [phone] (beeper) [phone] (Office)  11/12/2011, 9:26 AM

## 2011-11-12 NOTE — Progress Notes (Signed)
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  The patient is beginning her first cycle of chemotherapy today. She has had DVT/pulmonary embolism and is due to begin Coumadin in the near future. She is not on Decadron. She denies any CNS complaints at this time with no headaches, nausea, or significant vision change. Some dryness of skin in the scalp area.                              ALLERGIES:  is allergic to penicillins and codeine.  Meds: Current Outpatient Prescriptions  Medication Sig Dispense Refill  . albuterol (PROVENTIL) (2.5 MG/3ML) 0.083% nebulizer solution Take 2.5 mg by nebulization every 6 (six) hours as needed.      . ALPRAZolam (XANAX) 0.5 MG tablet Take 0.5 mg by mouth 4 (four) times daily as needed. For anxiety 0.5 1-2 tablets four times daily      . CARAFATE 1 GM/10ML suspension       . Cholecalciferol (VITAMIN D) 2000 UNITS CAPS Take 1 capsule by mouth daily.      Marland Kitchen CLODERM 0.1 % cream       . dexamethasone (DECADRON) 4 MG tablet Take 2 tablets by mouth two times a day starting the day after chemotherapy for 3 days.  30 tablet  1  . divalproex (DEPAKOTE) 500 MG DR tablet 500 mg 2 (two) times daily.       Marland Kitchen docusate sodium (COLACE) 100 MG capsule Take 100 mg by mouth as needed.      Marland Kitchen esomeprazole (NEXIUM) 40 MG capsule Take 40 mg by mouth daily before breakfast.      . famotidine (PEPCID) 20 MG tablet Take 20 mg by mouth 2 (two) times daily.      Marland Kitchen HYDROcodone-acetaminophen (NORCO) 5-325 MG per tablet Take 1 tablet by mouth every 4 (four) hours as needed.      Marland Kitchen LORazepam (ATIVAN) 0.5 MG tablet Take 1 tablet  (0.5 mg total) by mouth every 6 (six) hours as needed (Nausea or vomiting).  30 tablet  0  . metoprolol tartrate (LOPRESSOR) 25 MG tablet Take 25 mg by mouth 2 (two) times daily.      Marland Kitchen nystatin (MYCOSTATIN) 100000 UNIT/ML suspension Take 500,000 Units by mouth 4 (four) times daily.      Marland Kitchen olmesartan-hydrochlorothiazide (BENICAR HCT) 20-12.5 MG per tablet Take 0.25 tablets by mouth daily.      . ondansetron (ZOFRAN) 8 MG tablet Take 1 tab two times a day starting the day after chemo for 3 days. Then take 1 tab two times a day as needed for nausea or vomiting.  30 tablet  1  . polyethylene glycol (MIRALAX / GLYCOLAX) packet Take 17 g by mouth daily.      . prochlorperazine (COMPAZINE) 10 MG tablet Take 1 tablet (10 mg total) by mouth every 6 (six) hours as needed (Nausea or vomiting).  30 tablet  1  . prochlorperazine (COMPAZINE) 25 MG suppository Place 1 suppository (25 mg total) rectally every 12 (twelve) hours as needed for nausea.  12 suppository  3  . Simethicone (GAS-X PO) Take 1 tablet by mouth as needed. 1-2 prn      . simvastatin (ZOCOR) 10 MG tablet Take 10 mg by mouth at bedtime.      Marland Kitchen UNABLE TO FIND Apply 1 application topically as needed. Med Name:  Zovarix      . DISCONTD: citalopram (CELEXA) 10 MG tablet Take 1 tablet (10 mg total) by mouth daily.  30 tablet  0  . citalopram (CELEXA) 20 MG tablet Take 1 tablet (20 mg total) by mouth daily.  30 tablet  0   No current facility-administered medications for this encounter.   Facility-Administered Medications Ordered in Other Encounters  Medication Dose Route Frequency Provider Last Rate Last Dose  . 0.9 %  sodium chloride infusion   Intravenous Once Victorino December, MD      . CARBOplatin (PARAPLATIN) 750 mg in sodium chloride 0.9 % 250 mL chemo infusion  750 mg Intravenous Once Victorino December, MD   750 mg at 11/12/11 1532  . dexamethasone (DECADRON) injection 20 mg  20 mg Intravenous Once Victorino December, MD   20 mg at 11/12/11 1115  .  diphenhydrAMINE (BENADRYL) injection 50 mg  50 mg Intravenous Once Victorino December, MD   50 mg at 11/12/11 1115  . famotidine (PEPCID) IVPB 20 mg  20 mg Intravenous Once Victorino December, MD   20 mg at 11/12/11 1134  . LORazepam (ATIVAN) injection 0.5 mg  0.5 mg Intravenous Once Victorino December, MD   0.5 mg at 11/12/11 0005  . ondansetron (ZOFRAN) IVPB 16 mg  16 mg Intravenous Once Victorino December, MD   16 mg at 11/12/11 1115  . PACLitaxel (TAXOL) 324 mg in dextrose 5 % 500 mL chemo infusion (> 80mg /m2)  175 mg/m2 (Treatment Plan Actual) Intravenous Once Victorino December, MD   324 mg at 11/12/11 1250    Physical Findings: The patient is in no acute distress. Patient is alert and oriented.  weight is 177 lb 9.6 oz (80.559 kg). Her oral temperature is 98.5 F (36.9 C). Her blood pressure is 101/70 and her pulse is 73. Her respiration is 20. Marland Kitchen   General: Well-developed, in no acute distress HEENT: Surgical site looks good with no sign of infection. Well healed. Cardiovascular: Regular rate and rhythm Respiratory: Clear to auscultation bilaterally GI: Soft, nontender, normal bowel sounds Extremities: No edema present   Lab Findings: Lab Results  Component Value Date   WBC 8.2 11/12/2011   HGB 13.8 11/12/2011   HCT 40.5 11/12/2011   MCV 89.6 11/12/2011   PLT 187 11/12/2011     Radiographic Findings: Ct Angio Chest Pe W/cm &/or Wo Cm  11/04/2011  *RADIOLOGY REPORT*  Clinical Data: History of brain tumor; DVT with filter placement yesterday  CT ANGIOGRAPHY CHEST  Technique:  Multidetector CT imaging of the chest using the standard protocol during bolus administration of intravenous contrast. Multiplanar reconstructed images including MIPs were obtained and reviewed to evaluate the vascular anatomy.  Contrast: OMNIPAQUE IOHEXOL 350 MG/ML SOLN  Comparison: September 09, 2011  Findings: There is thrombus within the anterior right upper lobe pulmonary artery branches and subsegmental right lower lobe pulmonary  arterial branches.   There are no focal infiltrates or effusions.  There is no axillary, mediastinal, hilar adenopathy. A slightly prominent right hilar lymph node is stable and pain to indent and A 4 mm left lower lobe pulmonary nodule and small focus of ground-glass  opacity in the right upper lobe are unchanged.  The osseous structures and visualized upper abdomen are unremarkable.  IMPRESSION:  Right upper lobe and right lower lobe pulmonary emboli.  Findings were discussed with Jean Rosenthal, PA  at the time of the exam.  Original Report Authenticated By: Brandon Melnick, M.D.   Ir Ivc Filter Plmt / S&i /img Guid/mod Sed  10/29/2011  *RADIOLOGY REPORT*  IR IVC FILTER PLACEMENT  Date: 10/29/2011  Clinical History: 56 year old female with adenocarcinoma (GI versus lung primary) with brain metastasis.  She has a newly diagnosed left lower extremity DVT.  Systemic anticoagulation is contraindicated secondary to recent brain surgery for metastatic disease. She comes to interventional radiology for placement of a retrieval IVC filter for PE Prophylaxis.  Procedures Performed: 1. Venacavagram 2.  Placement of an infrarenal retrievable IVC filter 3.  Repeat cava gram  Interventional Radiologist:  Sterling Big, MD  Fluoroscopy time: 1.9  Contrast volume: 50 ml Omnipaque  PROCEDURE/FINDINGS:   Informed consent was obtained from the patient following explanation of the procedure, risks, benefits and alternatives. The patient understands, agrees and consents for the procedure. All questions were addressed. A time out was performed.  Maximal barrier sterile technique utilized including caps, mask, sterile gowns, sterile gloves, large sterile drape, hand hygiene, and chlorhexidine skin prep.  The right neck was interrogated with ultrasound and the right IJ found to be widely patent. Under direct sonographic guidance, the right internal jugular vein was punctured with a 21-gauge micropuncture needle.  An image was  obtained and stored electronic medical record.  A 0.018 " wire was then advanced into the superior vena cava followed by the micro dilator.  A 0.035-inch wire was then advanced into the right common iliac vein.  The [facility] select sheath was then advanced over the wire into the right common iliac vein.  A power injection of contrast material was performed. Normal appearing vena cava.  The maximal diameter is 19 mm.  The right renal vein is the lowest renal vein and is located at the level of the mid L2 vertebral body.  A Cook Celect retrievable IVC filter was then positioned with the clip just below the right renal vein.  The filter was deployed.  A post deployment cava gram confirms successful infrarenal positioning without tilt.  The sheath was removed and hemostasis attained by direct manual pressure.  The patient tolerated the procedure well, there was no immediate complications.  She was discharged home in good condition after observation period.  IMPRESSION:  Successful placement of a retrievable Cook Celect IVC filter in an infrarenal location.  This filter is potentially retrievable.  We will schedule the patient to return to interventional radiology clinic in approximately 3 months to assess for continued need of caval filtration.  Signed,  Sterling Big, MD Vascular & Interventional Radiologist [facility] Rad[facility]  Original Report Authenticated By: Vilma Prader   Mr 3d Recon At Scanner  10/16/2011  *RADIOLOGY REPORT*  Clinical Data:  Metastatic adenocarcinoma to the brain.  No known primary.  MRI ABDOMEN WITHOUT AND WITH CONTRAST (INCLUDING MRCP)  Technique:  Multiplanar multisequence MR imaging of the abdomen was performed both before and after the administration of intravenous contrast. Heavily T2-weighted images of the biliary and pancreatic ducts were obtained, and three-dimensional MRCP images were rendered by post processing.  Contrast: 15mL MULTIHANCE GADOBENATE DIMEGLUMINE 529 MG/ML IV SOLN   Comparison:  PET CT 09/30/2011.  CTs of the chest, abdomen and pelvis 09/09/2011.  Findings:  The gallbladder is surgically absent.  There is no intra or extrahepatic biliary dilatation.  There is no evidence of choledocholithiasis.  The pancreatic ductal anatomy is normal. There is no pancreatic ductal dilatation.  There is no evidence of pancreatic mass or surrounding fluid collection.  There are small cysts peripherally in the right hepatic lobe and in the caudate lobe.  No enhancing or otherwise suspicious liver lesions are present. There is mild hepatic steatosis.  There is a small accessory splenule.  The adrenal glands and kidneys appear normal.  There is no ascites, peritoneal nodularity or lymphadenopathy.  Aorto iliac atherosclerosis and ectasia appear stable.  No suspicious osseous findings are seen.  IMPRESSION:  1.  No evidence of primary malignancy within the abdomen. 2.  No evidence of metastatic disease within the abdomen. 3.  Small hepatic cysts and mild steatosis. 4.  No biliary dilatation status post cholecystectomy.  Original Report Authenticated By: Gerrianne Scale, M.D.   US Venous Img Lower Unilateral Left  10/29/2011  *RADIOLOGY REPORT*  Clinical Data: Lower extremity pain.  LEFT LOWER EXTREMITY VENOUS DUPLEX ULTRASOUND  Technique:  Gray-scale sonography with graded compression, as well as color Doppler and duplex ultrasound were performed to evaluate the deep venous system of the lower extremity from the level of the common femoral vein through the popliteal and proximal calf veins. Spectral Doppler was utilized to evaluate flow at rest and with distal augmentation maneuvers.  Comparison:  None.  Findings:  Occlusive thrombus is identified in the peroneal vein. Normal compressibility of the common femoral, superficial femoral, and popliteal veins is demonstrated, as well as the visualized proximal calf veins.  No filling defects to suggest DVT on grayscale or color Doppler imaging.   Doppler waveforms show normal direction of venous flow, normal respiratory phasicity and response to augmentation.  IMPRESSION: Study is positive for calf pain thrombosis with clot identified in the peroneal vein.  Original Report Authenticated By: Bernadene Bell. Maricela Curet, M.D.   Mr Abd W/wo Cm/mrcp  10/16/2011  *RADIOLOGY REPORT*  Clinical Data:  Metastatic adenocarcinoma to the brain.  No known primary.  MRI ABDOMEN WITHOUT AND WITH CONTRAST (INCLUDING MRCP)  Technique:  Multiplanar multisequence MR imaging of the abdomen was performed both before and after the administration of intravenous contrast. Heavily T2-weighted images of the biliary and pancreatic ducts were obtained, and three-dimensional MRCP images were rendered by post processing.  Contrast: 15mL MULTIHANCE GADOBENATE DIMEGLUMINE 529 MG/ML IV SOLN  Comparison:  PET CT 09/30/2011.  CTs of the chest, abdomen and pelvis 09/09/2011.  Findings:  The gallbladder is surgically absent.  There is no intra or extrahepatic biliary dilatation.  There is no evidence of choledocholithiasis.  The pancreatic ductal anatomy is normal. There is no pancreatic ductal dilatation.  There is no evidence of pancreatic mass or surrounding fluid collection.  There are small cysts peripherally in the right hepatic lobe and in the caudate lobe.  No enhancing or otherwise suspicious liver lesions are present. There is mild hepatic steatosis.  There is a small accessory splenule.  The adrenal glands and kidneys appear normal.  There is no ascites, peritoneal nodularity or lymphadenopathy.  Aorto iliac atherosclerosis and ectasia appear stable.  No suspicious osseous findings are seen.  IMPRESSION:  1.  No evidence of primary malignancy within the abdomen. 2.  No evidence of metastatic disease within the abdomen. 3.  Small hepatic cysts and mild steatosis. 4.  No biliary dilatation status post cholecystectomy.  Original Report Authenticated By:  Gerrianne Scale, M.D.     Impression:    Pleasant 56 year old female status post resection of a single brain metastasis with postoperative SRS. Patient is beginning palliative chemotherapy today.  Plan:  Please with how the patient has done with no signs of acute toxicity. We will have her continue with ongoing followup which will be staggered between myself and neurosurgery. The patient is proceeding with chemotherapy through Dr. Park Breed.   Radene Gunning, M.D., Ph.D.

## 2011-11-12 NOTE — Progress Notes (Signed)
Pt states her vision is still blurry, denies pain, headache, loss of appetite, nausea, fatigue, weakness. Daughter states pt has tapered off Decadron, but will be taking Decadron during chemo. Her first chemo is today.

## 2011-11-12 NOTE — Telephone Encounter (Signed)
Ok for patient to have a port placed

## 2011-11-12 NOTE — Progress Notes (Signed)
Clinical Social Worker met with pt and pt's husband in the infusion room to offer support and provide information on support services.  Pt stated she was doing "ok", but was experiencing some depression and anxiety.  Pt reported talking with her husband as a coping strategy.  CSW and pt discussed the emotional support available at [facility] for pt, husband, daughters, and grandchildren.  Pt has a large family who all live in the [place] area.  CSW encouraged pt and family to call with any needs or concerns.    Tamala Julian, MSW, LCSW Clinical Social Worker [facility] [phone]

## 2011-11-13 ENCOUNTER — Ambulatory Visit (HOSPITAL_BASED_OUTPATIENT_CLINIC_OR_DEPARTMENT_OTHER): Payer: BC Managed Care – PPO

## 2011-11-13 VITALS — BP 144/85 | HR 89 | Temp 98.3°F

## 2011-11-13 DIAGNOSIS — C7931 Secondary malignant neoplasm of brain: Secondary | ICD-10-CM

## 2011-11-13 DIAGNOSIS — Z5189 Encounter for other specified aftercare: Secondary | ICD-10-CM

## 2011-11-13 DIAGNOSIS — C801 Malignant (primary) neoplasm, unspecified: Secondary | ICD-10-CM

## 2011-11-13 DIAGNOSIS — C7949 Secondary malignant neoplasm of other parts of nervous system: Secondary | ICD-10-CM

## 2011-11-13 MED ORDER — PEGFILGRASTIM INJECTION 6 MG/0.6ML
6.0000 mg | Freq: Once | SUBCUTANEOUS | Status: AC
  Administered 2011-11-13: 6 mg via SUBCUTANEOUS

## 2011-11-13 NOTE — Patient Instructions (Addendum)

## 2011-11-15 ENCOUNTER — Telehealth: Payer: Self-pay | Admitting: *Deleted

## 2011-11-15 NOTE — Telephone Encounter (Signed)
Left message on patient's and Sherry's voicemail requesting a return call for f/u.

## 2011-11-15 NOTE — Telephone Encounter (Signed)
Message copied by Augusto Garbe on Mon Nov 15, 2011  3:03 PM ------      Message from: Kallie Locks      Created: Fri Nov 12, 2011  3:48 PM      Regarding: ist time chemotherapy      Contact: [phone]       Patient received first time Taxol and Carboplatin per Dr. Welton Flakes. Patient tolerated treatment well.            Dorene Grebe [phone]

## 2011-11-18 ENCOUNTER — Other Ambulatory Visit (HOSPITAL_BASED_OUTPATIENT_CLINIC_OR_DEPARTMENT_OTHER): Payer: BC Managed Care – PPO | Admitting: Lab

## 2011-11-18 ENCOUNTER — Ambulatory Visit (HOSPITAL_BASED_OUTPATIENT_CLINIC_OR_DEPARTMENT_OTHER): Payer: BC Managed Care – PPO

## 2011-11-18 ENCOUNTER — Telehealth: Payer: Self-pay | Admitting: *Deleted

## 2011-11-18 ENCOUNTER — Ambulatory Visit (HOSPITAL_BASED_OUTPATIENT_CLINIC_OR_DEPARTMENT_OTHER): Payer: BC Managed Care – PPO | Admitting: Oncology

## 2011-11-18 VITALS — BP 97/66 | HR 81 | Temp 98.7°F | Resp 20 | Ht 62.0 in | Wt 177.9 lb

## 2011-11-18 DIAGNOSIS — C7931 Secondary malignant neoplasm of brain: Secondary | ICD-10-CM

## 2011-11-18 DIAGNOSIS — E860 Dehydration: Secondary | ICD-10-CM

## 2011-11-18 DIAGNOSIS — C801 Malignant (primary) neoplasm, unspecified: Secondary | ICD-10-CM

## 2011-11-18 DIAGNOSIS — I2699 Other pulmonary embolism without acute cor pulmonale: Secondary | ICD-10-CM

## 2011-11-18 DIAGNOSIS — C7949 Secondary malignant neoplasm of other parts of nervous system: Secondary | ICD-10-CM

## 2011-11-18 DIAGNOSIS — C3490 Malignant neoplasm of unspecified part of unspecified bronchus or lung: Secondary | ICD-10-CM

## 2011-11-18 DIAGNOSIS — R911 Solitary pulmonary nodule: Secondary | ICD-10-CM

## 2011-11-18 LAB — CBC WITH DIFFERENTIAL/PLATELET
BASO%: 0.1 % (ref 0.0–2.0)
LYMPH%: 11.3 % — ABNORMAL LOW (ref 14.0–49.7)
MCHC: 33.5 g/dL (ref 31.5–36.0)
MCV: 90 fL (ref 79.5–101.0)
MONO%: 4.6 % (ref 0.0–14.0)
Platelets: 225 10*3/uL (ref 145–400)
RBC: 4.7 10*6/uL (ref 3.70–5.45)
WBC: 26.6 10*3/uL — ABNORMAL HIGH (ref 3.9–10.3)

## 2011-11-18 LAB — COMPREHENSIVE METABOLIC PANEL
ALT: 18 U/L (ref 0–35)
Alkaline Phosphatase: 112 U/L (ref 39–117)
Sodium: 138 mEq/L (ref 135–145)
Total Bilirubin: 0.3 mg/dL (ref 0.3–1.2)
Total Protein: 6 g/dL (ref 6.0–8.3)

## 2011-11-18 MED ORDER — SODIUM CHLORIDE 0.9 % IV SOLN
INTRAVENOUS | Status: DC
  Administered 2011-11-18: 1000 mL via INTRAVENOUS

## 2011-11-18 MED ORDER — AZITHROMYCIN 250 MG PO TABS: ORAL_TABLET | ORAL | Status: DC

## 2011-11-18 NOTE — Telephone Encounter (Signed)
Spoke with Inetta Fermo from IR she stated she has left the patient an voice message and is waiting the patient to call her back so she can get the appointment set up

## 2011-11-18 NOTE — Patient Instructions (Addendum)
[facility] Discharge Instructions for Patients Receiving IV fluids To help prevent nausea and vomiting after your treatment, we encourage you to take your nausea medication as per Dr. Welton Flakes.   If you develop nausea and vomiting that is not controlled by your nausea medication, call the clinic. If it is after clinic hours your family physician or the after hours number for the clinic or go to the Emergency Department.   BELOW ARE SYMPTOMS THAT SHOULD BE REPORTED IMMEDIATELY:  *FEVER GREATER THAN 100.5 F  *CHILLS WITH OR WITHOUT FEVER  NAUSEA AND VOMITING THAT IS NOT CONTROLLED WITH YOUR NAUSEA MEDICATION  *UNUSUAL SHORTNESS OF BREATH  *UNUSUAL BRUISING OR BLEEDING  TENDERNESS IN MOUTH AND THROAT WITH OR WITHOUT PRESENCE OF ULCERS  *URINARY PROBLEMS  *BOWEL PROBLEMS  UNUSUAL RASH Items with * indicate a potential emergency and should be followed up as soon as possible.   Feel free to call the clinic you have any questions or concerns. The clinic phone number is [phone].   I have been informed and understand all the instructions given to me. I know to contact the clinic, my physician, or go to the Emergency Department if any problems should occur. I do not have any questions at this time, but understand that I may call the clinic during office hours   should I have any questions or need assistance in obtaining follow up care.    __________________________________________  _____________  __________ Signature of Patient or Authorized Representative            Date                   Time    __________________________________________ Nurse's Signature

## 2011-11-18 NOTE — Progress Notes (Signed)
OFFICE PROGRESS NOTE  CC  Haley Lowe., MD [address] [place] [address] Dr. Shirlean Lowe Dr. Dorothy Lowe Haley Lowe  DIAGNOSIS: 56 year old female with metastatic adenocarcinoma with brain metastasis Unknown primary but possibly an upper GI source versus lung primary  PRIOR THERAPY:  #1 patient underwent resection on 6/12 of brain met with the pathology showing a adenocarcinoma likely upper GI primary. Patient has also had SRS on 10/06/2011.  #2 patient has had multiple staging studies including PET CT which showed a 7 mm nodule in the right upper lobe which was not seen previously on CT of the chest. She subsequently was seen by Dr. Craige Lowe in pulmonary for possible bronchoscopy/biopsy.  #3 patient also has had consultation with Haley Lowe she has undergone endoscopy colonoscopy without any evidence of a primary. She has undergone capsule endoscopy results of which are pending. Patient also has had MRCP and that was negative.  #4 Patient to begin chemotherapy pending results of the capsule endoscopy. If the capsule endoscopy is negative she will begin chemotherapy consisting of Taxol carboplatinum given every 21 days for a total of 4-6 cycles. First cycle given on 11/11/11  CURRENT THERAPY: s/p Carbo Taxol cycle 1 on 11/11/11 INTERVAL HISTORY: Haley Lowe 56 y.o. female returns for Followup visit today. Overall patient seems to not do as well she had been doing prior to her chemotherapy. She has a cough with green sputum which is thick and tenacious. She is not having any shortness of breath. The cough does keep her awake at night. She is denying any nausea or vomiting her appetite is down. She has been drinking about 4 glasses of water a day which is generally not enough. She does have a little bit of nausea. She has developed some diarrhea off-and-on since her chemotherapy. She is also complaining of some earaches. Most likely patient does have a  sinus infection. She has not had any fevers or chills. Remainder of the review of systems Is unremarkable  MEDICAL HISTORY: Past Medical History  Diagnosis Date  . Hypertension   . PONV (postoperative nausea and vomiting)   . Anxiety   . Depression   . COPD (chronic obstructive pulmonary disease)   . GERD (gastroesophageal reflux disease)   . Headache   . Arthritis   . Adenocarcinoma 09/28/2011  . Hx of adenomatous colonic polyps   . Pulmonary nodules   . Diabetes mellitus   . Emphysema   . 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 Allergy     hips and shoulders  . Cataract     1 lens implaqnt right eye,intact on left eye  . Goiter     nodule watching  . Fibromyalgia     neuropathy tops of both feet  . Metastasis to brain 09/15/11     left frontoparietal region  . History of radiation therapy 10/06/11    1 fraction  15 gray  . Pulmonary emboli 11/04/11    right upper lobe and r lower lobe PE  . Lung nodule seen on imaging study 11/04/11 CT    4mm LLL    ALLERGIES:  is allergic to penicillins and codeine.  MEDICATIONS:  Present medications are reviewed and updated  SURGICAL HISTORY:  Past Surgical History  Procedure Date  . Abdominal hysterectomy 1996  . Cholecystectomy 2000  . Shoulder surgery 1998    right  . Eye surgery 2001    vision  correction  . Foot surgery 2005    left  . Craniotomy 09/15/2011    Procedure: CRANIOTOMY TUMOR EXCISION;  Surgeon: Haley Shorts, MD;  Location: [facility];  Service: Neurosurgery;  Laterality: N/A;  Craniotomy resection of tumor with stealth  . Cataract extraction 2011    with lens implant    ECOG PERFORMANCE STATUS: 1 - Symptomatic but completely ambulatory  Blood pressure 97/66, pulse 81, temperature 98.7 F (37.1 C), temperature source Oral, resp. rate 20, height 5\' 2"  (1.575 m), weight 177 lb 14.4 oz (80.695 kg).  LABORATORY DATA: Lab Results  Component Value Date   WBC 26.6* 11/18/2011     HGB 14.2 11/18/2011   HCT 42.3 11/18/2011   MCV 90.0 11/18/2011   PLT 225 11/18/2011      Chemistry      Component Value Date/Time   NA 138 11/18/2011 1153   K 3.6 11/18/2011 1153   CL 100 11/18/2011 1153   CO2 27 11/18/2011 1153   BUN 14 11/18/2011 1153   CREATININE 0.65 11/18/2011 1153      Component Value Date/Time   CALCIUM 8.8 11/18/2011 1153   ALKPHOS 112 11/18/2011 1153   AST 13 11/18/2011 1153   ALT 18 11/18/2011 1153   BILITOT 0.3 11/18/2011 1153       RADIOGRAPHIC STUDIES:  Mr Haley Lowe ZO Contrast  10/01/2011  *RADIOLOGY REPORT*  Clinical Data: Metastatic adenocarcinoma to the brain, unspecified site.  S R S targeting.  MRI HEAD WITHOUT AND WITH CONTRAST  Technique:  Multiplanar, multiecho pulse sequences of the brain and surrounding structures were obtained according to standard protocol without and with intravenous contrast  Contrast: 15mL MULTIHANCE GADOBENATE DIMEGLUMINE 529 MG/ML IV SOLN  Comparison: Preoperative study 09/09/2011.  Findings: The patient is status post left posterior frontal craniotomy for piecemeal removal of a metastatic lesion, adenocarcinoma of unknown primary.  Mild restricted diffusion reflects postoperative change/hematoma.  Slight cerebral atrophy. Mild chronic microvascular ischemic change. Minimal surrounding vasogenic edema.  Thin section imaging post contrast reveals a lesion that appears to be solitary. The lesion is considerably decreased in size compared with the preoperative study, again roughly spherical with 9 mm diameter compared with 21 mm previously.  Intrinsic T1 shortening centrally representing postoperative hemorrhage is similar in signal intensity to post contrast enhancement on post infusion images. Subtraction sequence (series 101) shows ring-like enhancement, which suggests residual peripheral tumor inferiorly (images 122 - 127) as well as more superiorly and anteriorly (images 130-136). These areas of peripheral enhancement are unlikely to  represent granulation tissue given the relatively short interval  since recent surgery (09/15/2011).  IMPRESSION: Solitary postoperative left posterior frontal metastatic lesion. Marked reduction in tumor size and surrounding vasogenic edema. Peripheral enhancement seen best on postcontrast subtraction images suggests residual tumor.  Original Report Authenticated By: Elsie Stain, M.D.   Mr 3d Recon At Scanner  10/16/2011  *RADIOLOGY REPORT*  Clinical Data:  Metastatic adenocarcinoma to the brain.  No known primary.  MRI ABDOMEN WITHOUT AND WITH CONTRAST (INCLUDING MRCP)  Technique:  Multiplanar multisequence MR imaging of the abdomen was performed both before and after the administration of intravenous contrast. Heavily T2-weighted images of the biliary and pancreatic ducts were obtained, and three-dimensional MRCP images were rendered by post processing.  Contrast: 15mL MULTIHANCE GADOBENATE DIMEGLUMINE 529 MG/ML IV SOLN  Comparison:  PET CT 09/30/2011.  CTs of the chest, abdomen and pelvis 09/09/2011.  Findings:  The gallbladder is surgically absent.  There is no  intra or extrahepatic biliary dilatation.  There is no evidence of choledocholithiasis.  The pancreatic ductal anatomy is normal. There is no pancreatic ductal dilatation.  There is no evidence of pancreatic mass or surrounding fluid collection.  There are small cysts peripherally in the right hepatic lobe and in the caudate lobe.  No enhancing or otherwise suspicious liver lesions are present. There is mild hepatic steatosis.  There is a small accessory splenule.  The adrenal glands and kidneys appear normal.  There is no ascites, peritoneal nodularity or lymphadenopathy.  Aorto iliac atherosclerosis and ectasia appear stable.  No suspicious osseous findings are seen.  IMPRESSION:  1.  No evidence of primary malignancy within the abdomen. 2.  No evidence of metastatic disease within the abdomen. 3.  Small hepatic cysts and mild steatosis. 4.  No  biliary dilatation status post cholecystectomy.  Original Report Authenticated By: Gerrianne Scale, M.D.   Nm Pet Image Initial (pi) Whole Body  09/30/2011  *RADIOLOGY REPORT*  Clinical Data:  Initial treatment strategy for metastatic intracranial adenocarcinoma.  Evaluate for primary.  NUCLEAR MEDICINE WHOLE BODY PET CT  Technique:  Technique:  17.1 mCi F-18 FDG was injected intravenously. CT data was obtained and used for attenuation correction and anatomic localization only.  (This was not acquired as a diagnostic CT examination.) Additional exam technical data entered on technologist worksheet.  Comparison: Head CT of 09/13/2011.  Chest abdomen and pelvic CTs of 09/09/2011.  Findings: Head/neck: Area of photopenia within the left high frontal lobe which likely is postoperative.  Chest:  A focus of hypermetabolism within the anteromedial right upper lobe.  This measures a S.U.V. max of 4.7 on image 110.  This is felt to correspond to a possible 7x10 mm nodule within the right upper lobe including on transverse image 25 of series 3 and sagittal image 49 of series 5 on the 09/09/2011 CT.  No abnormal thoracic nodal activity.  Abdomen/Pelvis:  Subtle hypermetabolism within the anterior most aspect of the left lobe of the liver.  This measures a S.U.V. max of 4.8 on image 148.  This may correspond to subtle hypoattenuation on image 48 series 2 on the 09/09/2011 CT.  [place]/Extremities:  No abnormal hypermetabolism.  CT images performed for attenuation correction demonstrate surgical changes of left frontal parietal craniotomy.  No significant findings within the neck.  Chest findings deferred to recent diagnostic chest CT.  Moderate centrilobular emphysema.  Abdominal pelvic findings deferred to recent diagnostic CT. No acute superimposed process.  IMPRESSION:  1.  Possible subtle 7 mm hypermetabolic right upper lobe lung nodule.  This area is difficult to differentiate from adjacent sub tending vessels but is  suspicious, especially on sagittal reformatted images from the 09/09/2011 CT. Otherwise, no primary malignancy identified.  Of note this lesion would not typically cause intracranial metastasis, given small size and lack of adenopathy. 2.  Subtle hypermetabolism in the anterior left liver lobe.  This may correspond to a vague wedge-shaped hypoattenuation on the recent CT.  Not typical in morphology for hepatic metastasis. Consider short-term CT follow-up versus further characterization with pre and post contrast abdominal MRI.  Original Report Authenticated By: Consuello Bossier, M.D.   Mm Digital Diagnostic Bilat  10/05/2011  *RADIOLOGY REPORT*  Clinical Data:  Metastatic neoplasm to the brain.  DIGITAL DIAGNOSTIC BILATERAL MAMMOGRAM WITH CAD  Comparison:  November 03, 2010, January 23, 2009, October 27, 2007  Findings:  CC and MLO views of bilateral breasts are submitted.  No suspicious  abnormality is identified in both breasts. Mammographic images were processed with CAD.  IMPRESSION: Negative.  RECOMMENDATION: Routine screening mammogram in 1 year.  BI-RADS CATEGORY 1:  Negative.  Original Report Authenticated By: Sherian Rein, M.D.   Mr Abd W/wo Cm/mrcp  10/16/2011  *RADIOLOGY REPORT*  Clinical Data:  Metastatic adenocarcinoma to the brain.  No known primary.  MRI ABDOMEN WITHOUT AND WITH CONTRAST (INCLUDING MRCP)  Technique:  Multiplanar multisequence MR imaging of the abdomen was performed both before and after the administration of intravenous contrast. Heavily T2-weighted images of the biliary and pancreatic ducts were obtained, and three-dimensional MRCP images were rendered by post processing.  Contrast: 15mL MULTIHANCE GADOBENATE DIMEGLUMINE 529 MG/ML IV SOLN  Comparison:  PET CT 09/30/2011.  CTs of the chest, abdomen and pelvis 09/09/2011.  Findings:  The gallbladder is surgically absent.  There is no intra or extrahepatic biliary dilatation.  There is no evidence of choledocholithiasis.  The pancreatic  ductal anatomy is normal. There is no pancreatic ductal dilatation.  There is no evidence of pancreatic mass or surrounding fluid collection.  There are small cysts peripherally in the right hepatic lobe and in the caudate lobe.  No enhancing or otherwise suspicious liver lesions are present. There is mild hepatic steatosis.  There is a small accessory splenule.  The adrenal glands and kidneys appear normal.  There is no ascites, peritoneal nodularity or lymphadenopathy.  Aorto iliac atherosclerosis and ectasia appear stable.  No suspicious osseous findings are seen.  IMPRESSION:  1.  No evidence of primary malignancy within the abdomen. 2.  No evidence of metastatic disease within the abdomen. 3.  Small hepatic cysts and mild steatosis. 4.  No biliary dilatation status post cholecystectomy.  Original Report Authenticated By: Gerrianne Scale, M.D.    ASSESSMENT: 56 year old female with  #1 metastatic adenocarcinoma of unknown primary with brain metastasis. Patient is now status post  SRS. Post procedure she is doing quite well.  #2 we have performed extensive studies to try to elucidate the primary for at the adenocarcinoma unfortunately up-to-date all scans mammograms and procedures have been negative. Patient's PET scan did show a 7 millimeter nodule that had hypermetabolic activity concerning for a lung primary in this smoker.Further evaluation will be performed by pulmonary.  #3 Patient is pulmonary embolism as well as left lower extremity DVT. She had a [place] filter placed just recently. Patient will eventually need anticoagulation. However she also needs a Port-A-Cath placed and we will hold on starting her on anticoagulation until she has a port placed. We hopefully can get the port placed prior to her cycle 2 of chemotherapy.  #4 sinusitis/bronchitis  #5 dehydration  PLAN:   #1Patient will receive IV fluids today as she does look dehydrated and fatigue may be coming from  dehydration.  #2 for her cough and possibly bronchitis I will proceed with giving her azithromycin for 5 days.  #3 patient will return in one week's time for followup.  #4 I have scheduled her for a Port-A-Cath placement in the next week or so.  All questions were answered. The patient knows to call the clinic with any problems, questions or concerns. We can certainly see the patient much sooner if necessary.  I spent 30 minutes counseling the patient face to face. The total time spent in the appointment was 30 minutes.    Haley Second, MD Medical/Oncology [facility] [phone] (beeper) [phone] (Office)  11/18/2011, 5:09 PM

## 2011-11-18 NOTE — Telephone Encounter (Signed)
Made patient appoitment for 11-25-2011 starting at 10:15am labs md IV Fluids sent michelle email to set up patients fluids

## 2011-11-18 NOTE — Patient Instructions (Addendum)
1. Port placement as soon as possible  2. Return in 1 week for follow up   3. IVF today  4. Azithromycin ( Z-pak) take as directed)

## 2011-11-18 NOTE — Progress Notes (Signed)
Pt. Has had diarrhea and limited fluid intake.  Per Dr. Welton Flakes, 1L of IVF to be given today. HL

## 2011-11-19 ENCOUNTER — Telehealth: Payer: Self-pay | Admitting: *Deleted

## 2011-11-19 NOTE — Telephone Encounter (Signed)
Per staff message and POF I have scheduled appt.  JMW  

## 2011-11-22 ENCOUNTER — Encounter (HOSPITAL_COMMUNITY): Payer: Self-pay | Admitting: Pharmacy Technician

## 2011-11-23 ENCOUNTER — Other Ambulatory Visit: Payer: Self-pay | Admitting: Radiology

## 2011-11-23 NOTE — Progress Notes (Signed)
  Radiation Oncology         ([phone]) [phone] ________________________________  Name: Haley Lowe MRN: [id number]  Date: 10/06/2011  DOB: [birth year]   SPECIAL TREATMENT PROCEDURE   3D TREATMENT PLANNING AND DOSIMETRY: The patient's radiation plan was reviewed and approved by Dr. Newell Coral from neurosurgery and radiation oncology prior to treatment. It showed 3-dimensional radiation distributions overlaid onto the planning CT/MRI image set. The [facility] for the target structures as well as the organs at risk were reviewed. The documentation of the 3D plan and dosimetry are filed in the radiation oncology EMR.   NARRATIVE: The patient was brought to the TrueBeam stereotactic radiation treatment machine and placed supine on the CT couch. The head frame was applied, and the patient was set up for stereotactic radiosurgery. Neurosurgery was present for the set-up and delivery   SIMULATION VERIFICATION: In the couch zero-angle position, the patient underwent Exactrac imaging using the Brainlab system with orthogonal KV images. These were carefully aligned and repeated to confirm treatment position for each of the isocenters. The Exactrac snap film verification was repeated at each couch angle.   SPECIAL TREATMENT PROCEDURE: The patient received stereotactic radiosurgery to the following targets:  Single postoperative region in the left frontoparietal region. This area received 15 gray in 1 fraction using 4 dynamic conformal arcs.  ExacTrac Snap verification was performed for each couch angle.   STEREOTACTIC TREATMENT MANAGEMENT: Following delivery, the patient was transported to nursing in stable condition and monitored for possible acute effects. Vital signs were recorded . The patient tolerated treatment without significant acute effects, and was discharged to home in stable condition.  PLAN: Follow-up in one month.   ------------------------------------------------  Radene Gunning, MD, PhD

## 2011-11-23 NOTE — Addendum Note (Signed)
Encounter addended by: Jonna Coup, MD on: 11/23/2011 11:52 AM<BR>     Documentation filed: Notes Section

## 2011-11-24 ENCOUNTER — Telehealth: Payer: Self-pay | Admitting: *Deleted

## 2011-11-24 NOTE — Telephone Encounter (Signed)
patient is having port a cath placed on 11-25-2011 cancelled lab md and chemo for 11-25-2011 will reschedule after I inform the md and nurse

## 2011-11-25 ENCOUNTER — Ambulatory Visit: Payer: BC Managed Care – PPO

## 2011-11-25 ENCOUNTER — Ambulatory Visit: Payer: BC Managed Care – PPO | Admitting: Oncology

## 2011-11-25 ENCOUNTER — Other Ambulatory Visit: Payer: Self-pay | Admitting: Oncology

## 2011-11-25 ENCOUNTER — Other Ambulatory Visit: Payer: BC Managed Care – PPO | Admitting: Lab

## 2011-11-25 ENCOUNTER — Ambulatory Visit (HOSPITAL_COMMUNITY)
Admission: RE | Admit: 2011-11-25 | Discharge: 2011-11-25 | Disposition: A | Discharge status: Home / Self Care | Payer: BC Managed Care – PPO | Source: Ambulatory Visit | Attending: Oncology | Admitting: Oncology

## 2011-11-25 DIAGNOSIS — J438 Other emphysema: Secondary | ICD-10-CM | POA: Insufficient documentation

## 2011-11-25 DIAGNOSIS — C3490 Malignant neoplasm of unspecified part of unspecified bronchus or lung: Secondary | ICD-10-CM

## 2011-11-25 DIAGNOSIS — Z923 Personal history of irradiation: Secondary | ICD-10-CM | POA: Insufficient documentation

## 2011-11-25 DIAGNOSIS — E119 Type 2 diabetes mellitus without complications: Secondary | ICD-10-CM | POA: Insufficient documentation

## 2011-11-25 DIAGNOSIS — Z86711 Personal history of pulmonary embolism: Secondary | ICD-10-CM | POA: Insufficient documentation

## 2011-11-25 DIAGNOSIS — Z79899 Other long term (current) drug therapy: Secondary | ICD-10-CM | POA: Insufficient documentation

## 2011-11-25 DIAGNOSIS — I10 Essential (primary) hypertension: Secondary | ICD-10-CM | POA: Insufficient documentation

## 2011-11-25 DIAGNOSIS — C7931 Secondary malignant neoplasm of brain: Secondary | ICD-10-CM | POA: Insufficient documentation

## 2011-11-25 DIAGNOSIS — K219 Gastro-esophageal reflux disease without esophagitis: Secondary | ICD-10-CM | POA: Insufficient documentation

## 2011-11-25 DIAGNOSIS — C801 Malignant (primary) neoplasm, unspecified: Secondary | ICD-10-CM | POA: Insufficient documentation

## 2011-11-25 DIAGNOSIS — IMO0001 Reserved for inherently not codable concepts without codable children: Secondary | ICD-10-CM | POA: Insufficient documentation

## 2011-11-25 MED ORDER — VANCOMYCIN HCL IN DEXTROSE 1-5 GM/200ML-% IV SOLN
1000.0000 mg | Freq: Once | INTRAVENOUS | Status: AC
  Administered 2011-11-25: 1000 mg via INTRAVENOUS
  Filled 2011-11-25: qty 200

## 2011-11-25 MED ORDER — MIDAZOLAM HCL 5 MG/5ML IJ SOLN
INTRAMUSCULAR | Status: AC | PRN
  Administered 2011-11-25 (×2): 2 mg via INTRAVENOUS

## 2011-11-25 MED ORDER — HEPARIN SOD (PORK) LOCK FLUSH 100 UNIT/ML IV SOLN
500.0000 [IU] | Freq: Once | INTRAVENOUS | Status: AC
  Administered 2011-11-25: 500 [IU] via INTRAVENOUS

## 2011-11-25 MED ORDER — LIDOCAINE HCL 1 % IJ SOLN
INTRAMUSCULAR | Status: AC
  Filled 2011-11-25: qty 20

## 2011-11-25 MED ORDER — FENTANYL CITRATE 0.05 MG/ML IJ SOLN
INTRAMUSCULAR | Status: AC
  Filled 2011-11-25: qty 6

## 2011-11-25 MED ORDER — FENTANYL CITRATE 0.05 MG/ML IJ SOLN
INTRAMUSCULAR | Status: AC | PRN
  Administered 2011-11-25 (×2): 100 ug via INTRAVENOUS

## 2011-11-25 MED ORDER — MIDAZOLAM HCL 2 MG/2ML IJ SOLN
INTRAMUSCULAR | Status: AC
  Filled 2011-11-25: qty 6

## 2011-11-25 MED ORDER — SODIUM CHLORIDE 0.9 % IV SOLN
INTRAVENOUS | Status: DC
  Administered 2011-11-25: 09:00:00 via INTRAVENOUS

## 2011-11-25 NOTE — H&P (Signed)
Haley Lowe is an 56 y.o. female.   Chief Complaint: "I'm here for a port a cath" HPI: Patient with history of metastatic adenocarcinoma of unknown primary presents today for port a cath placement for chemotherapy.  Past Medical History  Diagnosis Date  . Hypertension   . PONV (postoperative nausea and vomiting)   . Anxiety   . Depression   . COPD (chronic obstructive pulmonary disease)   . GERD (gastroesophageal reflux disease)   . Headache   . Arthritis   . Adenocarcinoma 09/28/2011  . Hx of adenomatous colonic polyps   . Pulmonary nodules   . Diabetes mellitus   . Emphysema   . 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 Allergy     hips and shoulders  . Cataract     1 lens implaqnt right eye,intact on left eye  . Goiter     nodule watching  . Fibromyalgia     neuropathy tops of both feet  . Metastasis to brain 09/15/11     left frontoparietal region  . History of radiation therapy 10/06/11    1 fraction  15 gray  . Pulmonary emboli 11/04/11    right upper lobe and r lower lobe PE  . Lung nodule seen on imaging study 11/04/11 CT    4mm LLL    Past Surgical History  Procedure Date  . Abdominal hysterectomy 1996  . Cholecystectomy 2000  . Shoulder surgery 1998    right  . Eye surgery 2001    vision correction  . Foot surgery 2005    left  . Craniotomy 09/15/2011    Procedure: CRANIOTOMY TUMOR EXCISION;  Surgeon: [name], MD;  Location: [facility];  Service: Neurosurgery;  Laterality: N/A;  Craniotomy resection of tumor with stealth  . Cataract extraction 2011    with lens implant    Family History  Problem Relation Age of Onset  . Asthma Mother   . Kidney failure Father   . Diabetes Sister   . Heart attack Sister   . Colon cancer Brother   . COPD Sister   . Aneurysm Paternal Grandmother     brain  . Parkinsonism Maternal Uncle   . COPD Brother    Social History:  reports that she has been smoking Cigarettes.  She has  a 40 pack-year smoking history. She has never used smokeless tobacco. She reports that she drinks alcohol. She reports that she does not use illicit drugs.  Allergies:  Allergies  Allergen Reactions  . Penicillins Swelling    " my brain swelled, and mymouth"  . Codeine Itching    Mild takes benadryl   Current outpatient prescriptions:acyclovir ointment (ZOVIRAX) 5 %, Apply 1 application topically every 3 (three) hours. Apply to face, Disp: , Rfl: ;  albuterol (PROVENTIL) (2.5 MG/3ML) 0.083% nebulizer solution, Take 2.5 mg by nebulization every 6 (six) hours as needed. For shortness of breath, Disp: , Rfl: ;  CARAFATE 1 GM/10ML suspension, Take 1 g by mouth 2 (two) times daily. , Disp: , Rfl:  Cholecalciferol (VITAMIN D) 2000 UNITS CAPS, Take 1 capsule by mouth daily., Disp: , Rfl: ;  citalopram (CELEXA) 20 MG tablet, Take 20 mg by mouth every morning., Disp: , Rfl: ;  CLODERM 0.1 % cream, Apply 1 application topically as needed. For scalp, Disp: , Rfl: ;  dexamethasone (DECADRON) 4 MG tablet, Take 8 mg by mouth 2 (two) times daily. Take 2 tablets by  mouth two times a day starting the day after chemotherapy for 3 days., Disp: , Rfl:  divalproex (DEPAKOTE) 500 MG DR tablet, Take 500 mg by mouth 2 (two) times daily. , Disp: , Rfl: ;  docusate sodium (COLACE) 100 MG capsule, Take 100 mg by mouth as needed., Disp: , Rfl: ;  esomeprazole (NEXIUM) 40 MG capsule, Take 40 mg by mouth 2 (two) times daily. , Disp: , Rfl: ;  LORazepam (ATIVAN) 0.5 MG tablet, Take 1 tablet (0.5 mg total) by mouth every 6 (six) hours as needed (Nausea or vomiting)., Disp: 30 tablet, Rfl: 0 metoprolol tartrate (LOPRESSOR) 25 MG tablet, Take 25 mg by mouth 2 (two) times daily., Disp: , Rfl: ;  nystatin (MYCOSTATIN) 100000 UNIT/ML suspension, Take 500,000 Units by mouth 4 (four) times daily as needed. For thrush, Disp: , Rfl: ;  olmesartan-hydrochlorothiazide (BENICAR HCT) 20-12.5 MG per tablet, Take 0.25 tablets by mouth every morning.  1/4 tablet, Disp: , Rfl:  ondansetron (ZOFRAN) 8 MG tablet, Take 1 tab two times a day starting the day after chemo for 3 days. Then take 1 tab two times a day as needed for nausea or vomiting., Disp: 30 tablet, Rfl: 1;  polyethylene glycol (MIRALAX / GLYCOLAX) packet, Take 17 g by mouth daily. For constipation, Disp: , Rfl:  prochlorperazine (COMPAZINE) 10 MG tablet, Take 1 tablet (10 mg total) by mouth every 6 (six) hours as needed (Nausea or vomiting)., Disp: 30 tablet, Rfl: 1;  prochlorperazine (COMPAZINE) 25 MG suppository, Place 1 suppository (25 mg total) rectally every 12 (twelve) hours as needed for nausea., Disp: 12 suppository, Rfl: 3;  pseudoephedrine-guaifenesin (MUCINEX D) 60-600 MG per tablet, Take 1 tablet by mouth every 12 (twelve) hours., Disp: , Rfl:  Simethicone (GAS-X PO), Take 1 tablet by mouth as needed. 1-2 prn, Disp: , Rfl: ;  simvastatin (ZOCOR) 10 MG tablet, Take 10 mg by mouth at bedtime., Disp: , Rfl:  Current facility-administered medications:0.9 %  sodium chloride infusion, , Intravenous, Continuous, Brayton El, PA;  vancomycin (VANCOCIN) IVPB 1000 mg/200 mL premix, 1,000 mg, Intravenous, Once, Brayton El, PA  Results for orders placed in visit on 11/18/11  CBC WITH DIFFERENTIAL      Component Value Range   WBC 26.6 (*) 3.9 - 10.3 10e3/uL   NEUT# 22.2 (*) 1.5 - 6.5 10e3/uL   HGB 14.2  11.6 - 15.9 g/dL   HCT 16.1  09.6 - 04.5 %   Platelets 225  145 - 400 10e3/uL   MCV 90.0  79.5 - 101.0 fL   MCH 30.1  25.1 - 34.0 pg   MCHC 33.5  31.5 - 36.0 g/dL   RBC 4.09  8.11 - 9.14 10e6/uL   RDW 15.9 (*) 11.2 - 14.5 %   lymph# 3.0  0.9 - 3.3 10e3/uL   MONO# 1.2 (*) 0.1 - 0.9 10e3/uL   Eosinophils Absolute 0.1  0.0 - 0.5 10e3/uL   Basophils Absolute 0.0  0.0 - 0.1 10e3/uL   NEUT% 83.6 (*) 38.4 - 76.8 %   LYMPH% 11.3 (*) 14.0 - 49.7 %   MONO% 4.6  0.0 - 14.0 %   EOS% 0.4  0.0 - 7.0 %   BASO% 0.1  0.0 - 2.0 %  COMPREHENSIVE METABOLIC PANEL      Component Value Range     Sodium 138  135 - 145 mEq/L   Potassium 3.6  3.5 - 5.3 mEq/L   Chloride 100  96 - 112 mEq/L   CO2 27  19 - 32 mEq/L   Glucose, Bld 111 (*) 70 - 99 mg/dL   BUN 14  6 - 23 mg/dL   Creatinine, Ser 1.61  0.50 - 1.10 mg/dL   Total Bilirubin 0.3  0.3 - 1.2 mg/dL   Alkaline Phosphatase 112  39 - 117 U/L   AST 13  0 - 37 U/L   ALT 18  0 - 35 U/L   Total Protein 6.0  6.0 - 8.3 g/dL   Albumin 3.8  3.5 - 5.2 g/dL   Calcium 8.8  8.4 - 09.6 mg/dL     Review of Systems  Constitutional: Negative for fever and chills.  HENT:       Occ HA's  Respiratory: Positive for cough and shortness of breath.   Cardiovascular: Negative for chest pain.  Gastrointestinal: Negative for nausea, vomiting and abdominal pain.  Musculoskeletal: Negative for back pain.  Neurological: Positive for tremors.  Endo/Heme/Allergies: Does not bruise/bleed easily.   Filed Vitals:   11/25/11 0804  BP: 171/112  Pulse: 99  Temp: 98.6 F (37 C)  Resp: 21  SpO2: 95%     Temperature 98.6 F (37 C). Physical Exam  Constitutional: She is oriented to person, place, and time. She appears well-developed and well-nourished.  HENT:       craniotomy scar top/midline of scalp  Cardiovascular: Normal rate and regular rhythm.   Respiratory: Effort normal.       Distant BS bilat  GI: Soft. Bowel sounds are normal. There is no tenderness.  Musculoskeletal: Normal range of motion. She exhibits no edema.  Neurological: She is alert and oriented to person, place, and time.     Assessment/Plan Patient with metastatic adenocarcinoma of unknown primary; plan is for port a cath placement for chemotherapy. Details/risks of procedure d/w pt with her understanding and consent.  ALLRED,D KEVIN 11/25/2011, 8:23 AM

## 2011-11-25 NOTE — Procedures (Signed)
Interventional Radiology Procedure Note  Procedure: Placement of a right IJ approach single lumen PowerPort.  Tip is positioned at the superior cavoatrial junction and catheter is ready for immediate use.  Complications: No immediate Recommendations:  - Ok to shower tomorrow - Do not submerge for 7 days - Routine line care   Signed,  Heath K. McCullough, MD Vascular & Interventional Radiologist [facility]  

## 2011-11-25 NOTE — H&P (Signed)
Agree with above.  Leukocytosis almost certainly related to corticosteroid therapy.  Will proceed with placement of a right IJ approach portacatheter.  Signed,  Sterling Big, MD Vascular & Interventional Radiologist [facility]

## 2011-12-02 ENCOUNTER — Ambulatory Visit (HOSPITAL_BASED_OUTPATIENT_CLINIC_OR_DEPARTMENT_OTHER): Payer: BC Managed Care – PPO | Admitting: Oncology

## 2011-12-02 ENCOUNTER — Telehealth: Payer: Self-pay | Admitting: *Deleted

## 2011-12-02 ENCOUNTER — Encounter: Payer: Self-pay | Admitting: Oncology

## 2011-12-02 ENCOUNTER — Other Ambulatory Visit (HOSPITAL_BASED_OUTPATIENT_CLINIC_OR_DEPARTMENT_OTHER): Payer: BC Managed Care – PPO | Admitting: Lab

## 2011-12-02 ENCOUNTER — Other Ambulatory Visit: Payer: Self-pay | Admitting: Medical Oncology

## 2011-12-02 VITALS — BP 92/66 | HR 85 | Temp 98.9°F | Resp 20 | Ht 62.0 in | Wt 181.4 lb

## 2011-12-02 DIAGNOSIS — C801 Malignant (primary) neoplasm, unspecified: Secondary | ICD-10-CM

## 2011-12-02 DIAGNOSIS — C7931 Secondary malignant neoplasm of brain: Secondary | ICD-10-CM

## 2011-12-02 DIAGNOSIS — I2699 Other pulmonary embolism without acute cor pulmonale: Secondary | ICD-10-CM

## 2011-12-02 DIAGNOSIS — C7949 Secondary malignant neoplasm of other parts of nervous system: Secondary | ICD-10-CM

## 2011-12-02 DIAGNOSIS — I82409 Acute embolism and thrombosis of unspecified deep veins of unspecified lower extremity: Secondary | ICD-10-CM

## 2011-12-02 LAB — CBC WITH DIFFERENTIAL/PLATELET
BASO%: 0.9 % (ref 0.0–2.0)
EOS%: 0.9 % (ref 0.0–7.0)
MCH: 29.9 pg (ref 25.1–34.0)
MCHC: 33.1 g/dL (ref 31.5–36.0)
MCV: 90.5 fL (ref 79.5–101.0)
MONO%: 10.9 % (ref 0.0–14.0)
RDW: 15.2 % — ABNORMAL HIGH (ref 11.2–14.5)
lymph#: 1.5 10*3/uL (ref 0.9–3.3)

## 2011-12-02 LAB — COMPREHENSIVE METABOLIC PANEL (CC13)
ALT: 64 U/L — ABNORMAL HIGH (ref 0–55)
AST: 37 U/L — ABNORMAL HIGH (ref 5–34)
Albumin: 3.3 g/dL — ABNORMAL LOW (ref 3.5–5.0)
Alkaline Phosphatase: 81 U/L (ref 40–150)
BUN: 5 mg/dL — ABNORMAL LOW (ref 7.0–26.0)
Chloride: 103 mEq/L (ref 98–107)
Creatinine: 0.6 mg/dL (ref 0.6–1.1)
Potassium: 4.5 mEq/L (ref 3.5–5.1)

## 2011-12-02 LAB — CORRECTED CALCIUM (CC13): Calcium, Corrected: 10.3 mg/dL (ref 8.4–10.4)

## 2011-12-02 MED ORDER — LIDOCAINE-PRILOCAINE 2.5-2.5 % EX CREA: TOPICAL_CREAM | CUTANEOUS | Status: DC | PRN

## 2011-12-02 MED ORDER — ENOXAPARIN SODIUM 150 MG/ML ~~LOC~~ SOLN: 100.0000 mg | Freq: Every day | SUBCUTANEOUS | Status: DC

## 2011-12-02 MED ORDER — WARFARIN SODIUM 5 MG PO TABS: 5.0000 mg | ORAL_TABLET | Freq: Every day | ORAL | Status: DC

## 2011-12-02 NOTE — Telephone Encounter (Signed)
As of 12-02-2011 nothing to be scheduled

## 2011-12-02 NOTE — Patient Instructions (Addendum)
Begin lovenox injections 100 mg SQ daily starting on 12/02/11  Begin coumadin 5 mg daily daily  Enoxaparin, Home Use Enoxaparin (Lovenox) injection is a medication used to prevent clots from developing in your veins. Medications such as enoxaparin are called blood thinners or anticoagulants. If blood clots are untreated they could travel to your lungs. This is called a pulmonary embolus. A blood clot in your lungs can be fatal. Caregivers often use anticoagulants such as enoxaparin to prevent clots following surgery. It is also used along with aspirin when the heart is not getting enough blood. Usually another anticoagulant called warfarin (Coumadin) is started in 2 to 3 days after the enoxaparin is started. The enoxaparin is usually continued until the other anticoagulants have begun to work. Your caregiver will judge this length of time by measuring the length of time that it takes your blood to clot. RISKS AND COMPLICATIONS  If you have received recent epidural anesthesia, spinal anesthesia, or a spinal tap while receiving anticoagulants, you are at risk for developing a blood clot in or around the spine. This condition could result in long-term or permanent paralysis.   Because anticoagulants thin your blood, severe bleeding may occur from any tissue or organ. Symptoms of the blood being too thin may include:   Bleeding from the nose or gums that does not stop quickly.   Unusual bruising or bruising easily.   Swelling or pain at an injection site.   A cut that does not stop bleeding within 10 minutes.   Continual nausea for more than 1 day or vomiting blood.   Coughing up blood.   Blood in the urine which may appear as pink, red, or brown urine.   Blood in bowel movements which may appear as red, dark or black stools.   Sudden weakness or numbness of the face, arm, or leg, especially on one side of the body.   Sudden confusion.   Trouble speaking (aphasia) or understanding.    Sudden trouble seeing in one or both eyes.   Sudden trouble walking.   Dizziness.   Loss of balance or coordination.   Severe pain, such as a headache, joint pain, or back pain.   Fever.   Bruising around the injection sites may be expected.   Platelet drops, known as "thrombocytopenia," can occur with enoxaparin use. A condition called "heparin-induced thrombocytopenia" has been seen. If you have had this condition, you should tell your caregiver. Your caregiver may direct you to have blood tests to monitor this condition.   Do not use if you have allergies to the medication, heparin, or pork products.   Other side effects may include mild local reactions or irritation at the site of injection, pain, bruising, and redness of skin.  HOME CARE INSTRUCTIONS You will be instructed by your caregiver how to give enoxaparin injections. 1. Before giving your medication you should make sure the injection is a clear and colorless or pale yellow solution. If your medication becomes discolored or has particles in the bottle, do not use and notify your caregiver.  2. When using the 30 and 40 mg pre-filled syringes, do not expel the air bubble from the syringe before the injection. This makes sure you use all the medication in the syringe.  3. The injections will be given subcutaneously. This means it is given into the fat over the belly (abdomen). It is given deep beneath the skin but not into the muscle. The shots should be injected around the abdominal  wall. Change the sites of injection each time. The whole length of the needle should be introduced into a skin fold held between the thumb and forefinger; the skin fold should be held throughout the injection. Do not rub the injection site after completion of the injection. This increases bruising. Enoxaparin injection pre-filled syringes and graduated pre-filled syringes are available with a system that shields the needle after injection.  4. Inject  by pushing the plunger to the bottom of the syringe.  5. Remove the syringe from the injection site keeping your finger on the plunger rod. Be careful not to stick yourself or others.  6. After injection and the syringe is empty, set off the safety system by firmly pushing the plunger rod. The protective sleeve will automatically cover the needle and you can hear a click. The click means your needle is safely covered. Do not try replacing the needle shield.  7. Get rid of the syringe in the nearest sharps container.  8. Keep your medication safely stored at room temperatures.   Due to the complications of anticoagulants, it is very important that you take your anticoagulant as directed by your caregiver. Anticoagulants need to be taken exactly as instructed. Be sure you understand all your anticoagulant instructions.   Changes in medicines, supplements, diet, and illness can affect your anticoagulation therapy. Be sure to inform your caregivers of any of these changes.   While on anticoagulants, you will need to have blood tests done routinely as directed by your caregivers.   Be careful not to cut yourself when using sharp objects.   Avoid heavy or variable alcohol use. Consume alcohol only in very limited quantities. General alcohol intake guidelines are 1 drink for nonpregnant women and 2 drinks for men per day. (1 drink = 5 ounces of wine, 12 ounces of beer, or 1 ounces of liquor). A sudden increase in alcohol use can increase your risk of bleeding. Chronic alcohol use can cause warfarin to be less effective.   Limit physical activities or sports that could result in a fall or cause injury.   It is extremely important that you tell all of your caregivers and dentist that you are taking an anticoagulant, especially if you are injured or plan to have any type of procedure or operation.   Follow up with your laboratory test and caregiver appointments as directed. It is very important to keep  your appointments. Not keeping appointments could result in a chronic or permanent injury, pain, or disability.  SEEK MEDICAL CARE IF:  You develop any rashes.   You have any worsening of the condition for which you are receiving anticoagulation therapy.  SEEK IMMEDIATE MEDICAL CARE IF:  Bleeding from the nose or gums does not stop quickly.   You have unusual bruising or are bruising easily.   Swelling or pain occurs at an injection site.   A cut does not stop bleeding within 10 minutes.   You have continual nausea for more than 1 day or are vomiting blood.   You are coughing up blood.   You have blood in the urine.   You have dark or black stools.   You have sudden weakness or numbness of the face, arm, or leg, especially on one side of the body.   You have sudden confusion.   You have trouble speaking (aphasia) or understanding.   You have sudden trouble seeing in one or both eyes.   You have sudden trouble walking.   You  have dizziness.   You have a loss of balance or coordination.   You have severe pain, such as a headache, joint pain, or back pain.   You have a serious fall or head injury, even if you are not bleeding.   You have an oral temperature above 102 F (38.9 C), not controlled by medicine.  ANY OF THESE SYMPTOMS MAY REPRESENT A SERIOUS PROBLEM THAT IS AN EMERGENCY. Do not wait to see if the symptoms will go away. Get medical help right away. Call your local emergency services (911 in [place] DO NOT drive yourself to the hospital. MAKE SURE YOU:  Understand these instructions.   Will watch your condition.   Will get help right away if you are not doing well or get worse.  Document Released: 01/22/2004 Document Revised: 03/11/2011 Document Reviewed: 03/22/2005 [facility] Patient Information 2012 [place], [place].Warfarin Coagulopathy Warfarin (Coumadin) coagulopathy refers to bleeding that may occur as a complication of the medication warfarin. Warfarin  is an oral blood thinner (anticoagulant). Warfarin is used for many problems where thinning of the blood is needed to prevent blood clots. It usually takes 3 to 4 days of treatment with warfarin for the blood to be thinned to the target range. Blood tests will be done routinely to measure how fast your blood clots. The results of the blood tests will help determine your anticoagulant dose. Every medication has potential side effects or complications. Bleeding is the most common and most serious complication of warfarin. The amount of bleeding may be related to the dose of warfarin, the length of treatment, diet, underlying medical conditions, and the use of other medications or supplements. CAUSES  Intentional or accidental overdose.   Medication, herbal, supplement, or alcohol interactions.   Dietary changes.   Underlying medical conditions.  SYMPTOMS Severe bleeding may occur from any tissue or organ. Symptoms of the blood being too thin may include:  Bleeding from the nose or gums that does not stop quickly.   Unusual bruising or bruising easily.   Swelling or pain at an injection site.   A cut that does not stop bleeding within 10 minutes.   Continual nausea for more than 1 day or vomiting blood.   Coughing up blood.   Blood in the urine which may appear as pink, red, or brown urine.   Blood in bowel movements which may appear as red, dark or black stools.   Sudden weakness or numbness of the face, arm, or leg, especially on one side of the body.   Sudden confusion.   Trouble speaking (aphasia) or understanding.   Sudden trouble seeing in one or both eyes.   Sudden trouble walking.   Dizziness.   Loss of balance or coordination.   Severe pain, such as a headache, joint pain, or back pain.   Fever.  HOME CARE INSTRUCTIONS  Follow up with your laboratory test and caregiver appointments as directed. It is very important to keep your appointments. Not keeping  appointments could result in a chronic or permanent injury, pain, or disability.   Do not resume taking warfarin until directed to do so by your caregiver. Take warfarin exactly as directed by your caregiver. It is recommended that you take your warfarin dose at the same time of the day. It is preferred that you take your warfarin in the evening. This allows you to get your laboratory test results and if necessary, adjust your warfarin dose in a timely manner. Follow your caregiver's instructions if you  accidentally take an extra dose or miss a dose of warfarin. It is very important to take warfarin as directed since bleeding or blood clots could result in chronic or permanent injury, pain, or disability.   Some foods interfere with the effectiveness of warfarin. Eating large amounts of foods high in vitamin K can cause warfarin to be less effective. Changing to a diet low in foods containing vitamin K may lead to an excessive warfarin effect. Eat what you normally eat and keep the vitamin K content of your diet consistent. Consult your caregiver before making major dietary changes.   Some vitamins, supplements, and herbal products interfere with the effectiveness of warfarin. Vitamin E may increase the anticoagulant effects of warfarin. Vitamin K may can cause warfarin to be less effective. Consult your caregiver before changing or taking a new vitamin, supplement, or herbal product.   Several medications interfere with the effectiveness of warfarin. Pain relieving medications, antibiotics, and medications that decrease stomach acid are examples of some medications that can lead to an excessive warfarin effect. Warfarin may also interfere with the effectiveness of your other medicines. Consult your caregiver before stopping, changing, or taking new medications.   Some medical conditions may increase your risk for bleeding while you are taking warfarin. A fever, diarrhea lasting more than a day, worsening  heart failure, or worsening liver function are some medical conditions that could affect warfarin. Contact your caregiver if you have any of these medical conditions.   Be careful not to cut yourself when using sharp objects.   Avoid heavy or variable alcohol use. Consume alcohol only in very limited quantities. General alcohol intake guidelines are 1 drink for women and 2 drinks for men per day. (1 drink = 5 ounces of wine, 12 ounces of beer, or 1 ounces of liquor). A sudden increase in alcohol use can increase your risk of bleeding. Chronic alcohol use can cause warfarin to be less effective.   Limit physical activities or sports that could result in a fall or cause injury.   Inform all your caregivers and your dentist that you take warfarin.  SEEK IMMEDIATE MEDICAL CARE IF:  Bleeding from the nose or gums does not stop quickly.   You have unusual bruising or are bruising easily.   Swelling or pain occurs at an injection site.   A cut does not stop bleeding within 10 minutes.   You have continual nausea for more than 1 day or are vomiting blood.   You are coughing up blood.   You have blood in the urine.   You have dark or black stools.   You have sudden weakness or numbness of the face, arm, or leg, especially on one side of the body.   You have sudden confusion.   You have trouble speaking (aphasia) or understanding.   You have sudden trouble seeing in one or both eyes.   You have sudden trouble walking.   You have dizziness.   You have a loss of balance or coordination.   You have severe pain, such as a headache, joint pain, or back pain.   You have a serious fall or head injury, even if you are not bleeding.   You have an oral temperature above 102 F (38.9 C), not controlled by medicine.  Any of these symptoms may represent a serious problem that is an emergency. Do not wait to see if the symptoms will go away. Get medical help right away. Call your local  emergency services (911 in [place] DO NOT drive yourself to the hospital. Document Released: 02/28/2006 Document Revised: 03/11/2011 Document Reviewed: 03/06/2007 [facility] Patient Information 2012 [place], [place].

## 2011-12-02 NOTE — Progress Notes (Signed)
OFFICE PROGRESS NOTE  CC  [name], MD [address] [place] [address] Dr. Shirlean Kelly Dr. Dorothy Puffer Dr. [name]  DIAGNOSIS: 56 year old female with metastatic adenocarcinoma with brain metastasis Unknown primary but possibly an upper GI source versus lung primary  PRIOR THERAPY:  #1 patient underwent resection on 6/12 of brain met with the pathology showing a adenocarcinoma likely upper GI primary. Patient has also had SRS on 10/06/2011.  #2 patient has had multiple staging studies including PET CT which showed a 7 mm nodule in the right upper lobe which was not seen previously on CT of the chest. She subsequently was seen by Dr. Craige Cotta in pulmonary for possible bronchoscopy/biopsy.  #3 patient also has had consultation with Dr. [name] she has undergone endoscopy colonoscopy without any evidence of a primary. She has undergone capsule endoscopy results of which are pending. Patient also has had MRCP and that was negative.  #4 Patient to begin chemotherapy pending results of the capsule endoscopy. If the capsule endoscopy is negative she will begin chemotherapy consisting of Taxol carboplatinum given every 21 days for a total of 4-6 cycles. First cycle given on 11/11/11  CURRENT THERAPY: here for Carbo Taxol cycle 2 on 12/03/11 INTERVAL HISTORY: Haley Lowe 56 y.o. female returns for Followup visit today. Overall she is well. She had her port placed. No nausea or vomiting. Patient denies headaches or bleeding No fevers or chills. She does have a chronic cough. MEDICAL HISTORY: Past Medical History  Diagnosis Date  . Hypertension   . PONV (postoperative nausea and vomiting)   . Anxiety   . Depression   . COPD (chronic obstructive pulmonary disease)   . GERD (gastroesophageal reflux disease)   . Headache   . Arthritis   . Adenocarcinoma 09/28/2011  . Hx of adenomatous colonic polyps   . Pulmonary nodules   . Diabetes mellitus   .  Emphysema   . 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 Allergy     hips and shoulders  . Cataract     1 lens implaqnt right eye,intact on left eye  . Goiter     nodule watching  . Fibromyalgia     neuropathy tops of both feet  . Metastasis to brain 09/15/11     left frontoparietal region  . History of radiation therapy 10/06/11    1 fraction  15 gray  . Pulmonary emboli 11/04/11    right upper lobe and r lower lobe PE  . Lung nodule seen on imaging study 11/04/11 CT    4mm LLL    ALLERGIES:  is allergic to penicillins and codeine.  MEDICATIONS:  Present medications are reviewed and updated  SURGICAL HISTORY:  Past Surgical History  Procedure Date  . Abdominal hysterectomy 1996  . Cholecystectomy 2000  . Shoulder surgery 1998    right  . Eye surgery 2001    vision correction  . Foot surgery 2005    left  . Craniotomy 09/15/2011    Procedure: CRANIOTOMY TUMOR EXCISION;  Surgeon: [name], MD;  Location: [facility];  Service: Neurosurgery;  Laterality: N/A;  Craniotomy resection of tumor with stealth  . Cataract extraction 2011    with lens implant    ECOG PERFORMANCE STATUS: 1 - Symptomatic but completely ambulatory  Blood pressure 92/66, pulse 85, temperature 98.9 F (37.2 C), temperature source Oral, resp. rate 20, height 5\' 2"  (1.575 m), weight  181 lb 6.4 oz (82.283 kg).  LABORATORY DATA: Lab Results  Component Value Date   WBC 5.3 12/02/2011   HGB 12.3 12/02/2011   HCT 37.2 12/02/2011   MCV 90.5 12/02/2011   PLT 244 12/02/2011      Chemistry      Component Value Date/Time   NA 138 11/18/2011 1153   K 3.6 11/18/2011 1153   CL 100 11/18/2011 1153   CO2 27 11/18/2011 1153   BUN 14 11/18/2011 1153   CREATININE 0.65 11/18/2011 1153      Component Value Date/Time   CALCIUM 8.8 11/18/2011 1153   ALKPHOS 112 11/18/2011 1153   AST 13 11/18/2011 1153   ALT 18 11/18/2011 1153   BILITOT 0.3 11/18/2011 1153     RADIOGRAPHIC  STUDIES:  ASSESSMENT: 56 year old female with  #1 metastatic adenocarcinoma of unknown primary with brain metastasis. Patient is now status post  SRS. Post procedure she is doing quite well.  #2 we have performed extensive studies to try to elucidate the primary for at the adenocarcinoma unfortunately up-to-date all scans mammograms and procedures have been negative. Patient's PET scan did show a 7 millimeter nodule that had hypermetabolic activity concerning for a lung primary in this smoker.Further evaluation will be performed by pulmonary.  #3 Patient is pulmonary embolism as well as left lower extremity DVT. She had a [place] filter placed just recently. Now that she has had her port placed we will begin lovenox and coumadin daily. Risks and benefits of both were discussed with the patient and her husband. Prescriptions were sent to her pharmacy.  4. She is due for cycle 2 of her chemotherapy with carbo/taxol   PLAN:  1. Proceed with chemotherapy on 12/03/11  2. Return in 1 week for follow up and blood work including INR  3. Call with any problems or questions   All questions were answered. The patient knows to call the clinic with any problems, questions or concerns. We can certainly see the patient much sooner if necessary.  I spent >25 minutes counseling the patient face to face. The total time spent in the appointment was 30 minutes.    Drue Second, MD Medical/Oncology [facility] [phone] (beeper) [phone] (Office)  12/02/2011, 10:58 PM

## 2011-12-03 ENCOUNTER — Ambulatory Visit (HOSPITAL_BASED_OUTPATIENT_CLINIC_OR_DEPARTMENT_OTHER): Payer: BC Managed Care – PPO

## 2011-12-03 VITALS — BP 106/72 | HR 82 | Temp 98.4°F

## 2011-12-03 DIAGNOSIS — C801 Malignant (primary) neoplasm, unspecified: Secondary | ICD-10-CM

## 2011-12-03 DIAGNOSIS — C7931 Secondary malignant neoplasm of brain: Secondary | ICD-10-CM

## 2011-12-03 DIAGNOSIS — Z5111 Encounter for antineoplastic chemotherapy: Secondary | ICD-10-CM

## 2011-12-03 DIAGNOSIS — C7949 Secondary malignant neoplasm of other parts of nervous system: Secondary | ICD-10-CM

## 2011-12-03 MED ORDER — SODIUM CHLORIDE 0.9 % IV SOLN
Freq: Once | INTRAVENOUS | Status: AC
  Administered 2011-12-03: 13:00:00 via INTRAVENOUS

## 2011-12-03 MED ORDER — FAMOTIDINE IN NACL 20-0.9 MG/50ML-% IV SOLN
20.0000 mg | Freq: Once | INTRAVENOUS | Status: AC
  Administered 2011-12-03: 20 mg via INTRAVENOUS

## 2011-12-03 MED ORDER — LORAZEPAM 2 MG/ML IJ SOLN
0.5000 mg | Freq: Once | INTRAMUSCULAR | Status: AC
  Administered 2011-12-03: 0.5 mg via INTRAVENOUS

## 2011-12-03 MED ORDER — HEPARIN SOD (PORK) LOCK FLUSH 100 UNIT/ML IV SOLN
500.0000 [IU] | Freq: Once | INTRAVENOUS | Status: AC | PRN
  Administered 2011-12-03: 500 [IU]
  Filled 2011-12-03: qty 5

## 2011-12-03 MED ORDER — PACLITAXEL CHEMO INJECTION 300 MG/50ML
175.0000 mg/m2 | Freq: Once | INTRAVENOUS | Status: AC
  Administered 2011-12-03: 324 mg via INTRAVENOUS
  Filled 2011-12-03: qty 54

## 2011-12-03 MED ORDER — DEXAMETHASONE SODIUM PHOSPHATE 4 MG/ML IJ SOLN
20.0000 mg | Freq: Once | INTRAMUSCULAR | Status: AC
  Administered 2011-12-03: 20 mg via INTRAVENOUS

## 2011-12-03 MED ORDER — SODIUM CHLORIDE 0.9 % IJ SOLN
10.0000 mL | INTRAMUSCULAR | Status: DC | PRN
  Administered 2011-12-03: 10 mL
  Filled 2011-12-03: qty 10

## 2011-12-03 MED ORDER — DIPHENHYDRAMINE HCL 50 MG/ML IJ SOLN
50.0000 mg | Freq: Once | INTRAMUSCULAR | Status: AC
  Administered 2011-12-03: 50 mg via INTRAVENOUS

## 2011-12-03 MED ORDER — ONDANSETRON 16 MG/50ML IVPB (CHCC)
16.0000 mg | Freq: Once | INTRAVENOUS | Status: AC
  Administered 2011-12-03: 16 mg via INTRAVENOUS

## 2011-12-03 MED ORDER — SODIUM CHLORIDE 0.9 % IV SOLN
750.0000 mg | Freq: Once | INTRAVENOUS | Status: AC
  Administered 2011-12-03: 750 mg via INTRAVENOUS
  Filled 2011-12-03: qty 75

## 2011-12-03 NOTE — Patient Instructions (Addendum)
[facility] Discharge Instructions for Patients Receiving Chemotherapy  Today you received the following chemotherapy agents: taxol, carboplatin  To help prevent nausea and vomiting after your treatment, we encourage you to take your nausea medication.  Take it as often as prescribed.     If you develop nausea and vomiting that is not controlled by your nausea medication, call the clinic. If it is after clinic hours your family physician or the after hours number for the clinic or go to the Emergency Department.   BELOW ARE SYMPTOMS THAT SHOULD BE REPORTED IMMEDIATELY:  *FEVER GREATER THAN 100.5 F  *CHILLS WITH OR WITHOUT FEVER  NAUSEA AND VOMITING THAT IS NOT CONTROLLED WITH YOUR NAUSEA MEDICATION  *UNUSUAL SHORTNESS OF BREATH  *UNUSUAL BRUISING OR BLEEDING  TENDERNESS IN MOUTH AND THROAT WITH OR WITHOUT PRESENCE OF ULCERS  *URINARY PROBLEMS  *BOWEL PROBLEMS  UNUSUAL RASH Items with * indicate a potential emergency and should be followed up as soon as possible.  Feel free to call the clinic you have any questions or concerns. The clinic phone number is [phone].   I have been informed and understand all the instructions given to me. I know to contact the clinic, my physician, or go to the Emergency Department if any problems should occur. I do not have any questions at this time, but understand that I may call the clinic during office hours   should I have any questions or need assistance in obtaining follow up care.    __________________________________________  _____________  __________ Signature of Patient or Authorized Representative            Date                   Time    __________________________________________ Nurse's Signature   Carboplatin injection What is this medicine? CARBOPLATIN (KAR boe pla tin) is a chemotherapy drug. It targets fast dividing cells, like cancer cells, and causes these cells to die. This medicine is used to  treat ovarian cancer and many other cancers. This medicine may be used for other purposes; ask your health care provider or pharmacist if you have questions. What should I tell my health care provider before I take this medicine? They need to know if you have any of these conditions: -blood disorders -hearing problems -kidney disease -recent or ongoing radiation therapy -an unusual or allergic reaction to carboplatin, cisplatin, other chemotherapy, other medicines, foods, dyes, or preservatives -pregnant or trying to get pregnant -breast-feeding How should I use this medicine? This drug is usually given as an infusion into a vein. It is administered in a hospital or clinic by a specially trained health care professional. Talk to your pediatrician regarding the use of this medicine in children. Special care may be needed. Overdosage: If you think you have taken too much of this medicine contact a poison control center or emergency room at once. NOTE: This medicine is only for you. Do not share this medicine with others. What if I miss a dose? It is important not to miss a dose. Call your doctor or health care professional if you are unable to keep an appointment. What may interact with this medicine? -medicines for seizures -medicines to increase blood counts like filgrastim, pegfilgrastim, sargramostim -some antibiotics like amikacin, gentamicin, neomycin, streptomycin, tobramycin -vaccines Talk to your doctor or health care professional before taking any of these medicines: -acetaminophen -aspirin -ibuprofen -ketoprofen -naproxen This list may not describe all possible interactions. Give your  health care provider a list of all the medicines, herbs, non-prescription drugs, or dietary supplements you use. Also tell them if you smoke, drink alcohol, or use illegal drugs. Some items may interact with your medicine. What should I watch for while using this medicine? Your condition will be  monitored carefully while you are receiving this medicine. You will need important blood work done while you are taking this medicine. This drug may make you feel generally unwell. This is not uncommon, as chemotherapy can affect healthy cells as well as cancer cells. Report any side effects. Continue your course of treatment even though you feel ill unless your doctor tells you to stop. In some cases, you may be given additional medicines to help with side effects. Follow all directions for their use. Call your doctor or health care professional for advice if you get a fever, chills or sore throat, or other symptoms of a cold or flu. Do not treat yourself. This drug decreases your body's ability to fight infections. Try to avoid being around people who are sick. This medicine may increase your risk to bruise or bleed. Call your doctor or health care professional if you notice any unusual bleeding. Be careful brushing and flossing your teeth or using a toothpick because you may get an infection or bleed more easily. If you have any dental work done, tell your dentist you are receiving this medicine. Avoid taking products that contain aspirin, acetaminophen, ibuprofen, naproxen, or ketoprofen unless instructed by your doctor. These medicines may hide a fever. Do not become pregnant while taking this medicine. Women should inform their doctor if they wish to become pregnant or think they might be pregnant. There is a potential for serious side effects to an unborn child. Talk to your health care professional or pharmacist for more information. Do not breast-feed an infant while taking this medicine. What side effects may I notice from receiving this medicine? Side effects that you should report to your doctor or health care professional as soon as possible: -allergic reactions like skin rash, itching or hives, swelling of the face, lips, or tongue -signs of infection - fever or chills, cough, sore throat,  pain or difficulty passing urine -signs of decreased platelets or bleeding - bruising, pinpoint red spots on the skin, black, tarry stools, nosebleeds -signs of decreased red blood cells - unusually weak or tired, fainting spells, lightheadedness -breathing problems -changes in hearing -changes in vision -chest pain -high blood pressure -low blood counts - This drug may decrease the number of white blood cells, red blood cells and platelets. You may be at increased risk for infections and bleeding. -nausea and vomiting -pain, swelling, redness or irritation at the injection site -pain, tingling, numbness in the hands or feet -problems with balance, talking, walking -trouble passing urine or change in the amount of urine Side effects that usually do not require medical attention (report to your doctor or health care professional if they continue or are bothersome): -hair loss -loss of appetite -metallic taste in the mouth or changes in taste This list may not describe all possible side effects. Call your doctor for medical advice about side effects. You may report side effects to FDA at 1-800-FDA-1088. Where should I keep my medicine? This drug is given in a hospital or clinic and will not be stored at home. NOTE: This sheet is a summary. It may not cover all possible information. If you have questions about this medicine, talk to your doctor, pharmacist, or  health care provider.  2012, Elsevier/Gold Standard. (06/27/2007 2:38:05 PM)  Paclitaxel injection What is this medicine? PACLITAXEL (PAK li TAX el) is a chemotherapy drug. It targets fast dividing cells, like cancer cells, and causes these cells to die. This medicine is used to treat ovarian cancer, breast cancer, and other cancers. This medicine may be used for other purposes; ask your health care provider or pharmacist if you have questions. What should I tell my health care provider before I take this medicine? They need to know  if you have any of these conditions: -blood disorders -irregular heartbeat -infection (especially a virus infection such as chickenpox, cold sores, or herpes) -liver disease -previous or ongoing radiation therapy -an unusual or allergic reaction to paclitaxel, alcohol, polyoxyethylated castor oil, other chemotherapy agents, other medicines, foods, dyes, or preservatives -pregnant or trying to get pregnant -breast-feeding How should I use this medicine? This drug is given as an infusion into a vein. It is administered in a hospital or clinic by a specially trained health care professional. Talk to your pediatrician regarding the use of this medicine in children. Special care may be needed. Overdosage: If you think you have taken too much of this medicine contact a poison control center or emergency room at once. NOTE: This medicine is only for you. Do not share this medicine with others. What if I miss a dose? It is important not to miss your dose. Call your doctor or health care professional if you are unable to keep an appointment. What may interact with this medicine? Do not take this medicine with any of the following medications: -disulfiram -metronidazole This medicine may also interact with the following medications: -cyclosporine -dexamethasone -diazepam -ketoconazole -medicines to increase blood counts like filgrastim, pegfilgrastim, sargramostim -other chemotherapy drugs like cisplatin, doxorubicin, epirubicin, etoposide, teniposide, vincristine -quinidine -testosterone -vaccines -verapamil Talk to your doctor or health care professional before taking any of these medicines: -acetaminophen -aspirin -ibuprofen -ketoprofen -naproxen This list may not describe all possible interactions. Give your health care provider a list of all the medicines, herbs, non-prescription drugs, or dietary supplements you use. Also tell them if you smoke, drink alcohol, or use illegal drugs.  Some items may interact with your medicine. What should I watch for while using this medicine? Your condition will be monitored carefully while you are receiving this medicine. You will need important blood work done while you are taking this medicine. This drug may make you feel generally unwell. This is not uncommon, as chemotherapy can affect healthy cells as well as cancer cells. Report any side effects. Continue your course of treatment even though you feel ill unless your doctor tells you to stop. In some cases, you may be given additional medicines to help with side effects. Follow all directions for their use. Call your doctor or health care professional for advice if you get a fever, chills or sore throat, or other symptoms of a cold or flu. Do not treat yourself. This drug decreases your body's ability to fight infections. Try to avoid being around people who are sick. This medicine may increase your risk to bruise or bleed. Call your doctor or health care professional if you notice any unusual bleeding. Be careful brushing and flossing your teeth or using a toothpick because you may get an infection or bleed more easily. If you have any dental work done, tell your dentist you are receiving this medicine. Avoid taking products that contain aspirin, acetaminophen, ibuprofen, naproxen, or ketoprofen unless instructed by  your doctor. These medicines may hide a fever. Do not become pregnant while taking this medicine. Women should inform their doctor if they wish to become pregnant or think they might be pregnant. There is a potential for serious side effects to an unborn child. Talk to your health care professional or pharmacist for more information. Do not breast-feed an infant while taking this medicine. Men are advised not to father a child while receiving this medicine. What side effects may I notice from receiving this medicine? Side effects that you should report to your doctor or health  care professional as soon as possible: -allergic reactions like skin rash, itching or hives, swelling of the face, lips, or tongue -low blood counts - This drug may decrease the number of white blood cells, red blood cells and platelets. You may be at increased risk for infections and bleeding. -signs of infection - fever or chills, cough, sore throat, pain or difficulty passing urine -signs of decreased platelets or bleeding - bruising, pinpoint red spots on the skin, black, tarry stools, nosebleeds -signs of decreased red blood cells - unusually weak or tired, fainting spells, lightheadedness -breathing problems -chest pain -high or low blood pressure -mouth sores -nausea and vomiting -pain, swelling, redness or irritation at the injection site -pain, tingling, numbness in the hands or feet -slow or irregular heartbeat -swelling of the ankle, feet, hands Side effects that usually do not require medical attention (report to your doctor or health care professional if they continue or are bothersome): -bone pain -complete hair loss including hair on your head, underarms, pubic hair, eyebrows, and eyelashes -changes in the color of fingernails -diarrhea -loosening of the fingernails -loss of appetite -muscle or joint pain -red flush to skin -sweating This list may not describe all possible side effects. Call your doctor for medical advice about side effects. You may report side effects to FDA at 1-800-FDA-1088. Where should I keep my medicine? This drug is given in a hospital or clinic and will not be stored at home. NOTE: This sheet is a summary. It may not cover all possible information. If you have questions about this medicine, talk to your doctor, pharmacist, or health care provider.  2012, Elsevier/Gold Standard. (03/04/2008 11:54:26 AM)

## 2011-12-04 ENCOUNTER — Ambulatory Visit (HOSPITAL_BASED_OUTPATIENT_CLINIC_OR_DEPARTMENT_OTHER): Payer: BC Managed Care – PPO

## 2011-12-04 VITALS — BP 124/78 | HR 103 | Temp 98.8°F | Resp 20

## 2011-12-04 DIAGNOSIS — C7931 Secondary malignant neoplasm of brain: Secondary | ICD-10-CM

## 2011-12-04 DIAGNOSIS — C801 Malignant (primary) neoplasm, unspecified: Secondary | ICD-10-CM

## 2011-12-04 DIAGNOSIS — Z5189 Encounter for other specified aftercare: Secondary | ICD-10-CM

## 2011-12-04 MED ORDER — PEGFILGRASTIM INJECTION 6 MG/0.6ML
6.0000 mg | Freq: Once | SUBCUTANEOUS | Status: AC
  Administered 2011-12-04: 6 mg via SUBCUTANEOUS

## 2011-12-09 ENCOUNTER — Telehealth: Payer: Self-pay | Admitting: Internal Medicine

## 2011-12-09 ENCOUNTER — Ambulatory Visit (HOSPITAL_BASED_OUTPATIENT_CLINIC_OR_DEPARTMENT_OTHER): Payer: BC Managed Care – PPO | Admitting: Oncology

## 2011-12-09 ENCOUNTER — Other Ambulatory Visit (HOSPITAL_BASED_OUTPATIENT_CLINIC_OR_DEPARTMENT_OTHER): Payer: BC Managed Care – PPO | Admitting: Lab

## 2011-12-09 ENCOUNTER — Encounter: Payer: Self-pay | Admitting: Oncology

## 2011-12-09 VITALS — BP 88/67 | HR 74 | Temp 98.3°F | Resp 20 | Ht 62.0 in | Wt 179.5 lb

## 2011-12-09 DIAGNOSIS — C801 Malignant (primary) neoplasm, unspecified: Secondary | ICD-10-CM

## 2011-12-09 DIAGNOSIS — R918 Other nonspecific abnormal finding of lung field: Secondary | ICD-10-CM

## 2011-12-09 DIAGNOSIS — C7949 Secondary malignant neoplasm of other parts of nervous system: Secondary | ICD-10-CM

## 2011-12-09 DIAGNOSIS — I82409 Acute embolism and thrombosis of unspecified deep veins of unspecified lower extremity: Secondary | ICD-10-CM

## 2011-12-09 DIAGNOSIS — C7931 Secondary malignant neoplasm of brain: Secondary | ICD-10-CM

## 2011-12-09 DIAGNOSIS — Z86718 Personal history of other venous thrombosis and embolism: Secondary | ICD-10-CM

## 2011-12-09 DIAGNOSIS — R911 Solitary pulmonary nodule: Secondary | ICD-10-CM

## 2011-12-09 DIAGNOSIS — I2699 Other pulmonary embolism without acute cor pulmonale: Secondary | ICD-10-CM

## 2011-12-09 HISTORY — DX: Acute embolism and thrombosis of unspecified deep veins of unspecified lower extremity: I82.409

## 2011-12-09 HISTORY — DX: Other pulmonary embolism without acute cor pulmonale: I26.99

## 2011-12-09 LAB — CBC WITH DIFFERENTIAL/PLATELET
BASO%: 0.2 % (ref 0.0–2.0)
Basophils Absolute: 0 10*3/uL (ref 0.0–0.1)
EOS%: 5.1 % (ref 0.0–7.0)
HCT: 36.5 % (ref 34.8–46.6)
HGB: 12.5 g/dL (ref 11.6–15.9)
MCH: 31.5 pg (ref 25.1–34.0)
MCHC: 34.3 g/dL (ref 31.5–36.0)
MCV: 91.9 fL (ref 79.5–101.0)
MONO%: 4.1 % (ref 0.0–14.0)
NEUT%: 71.9 % (ref 38.4–76.8)
lymph#: 1.9 10*3/uL (ref 0.9–3.3)

## 2011-12-09 LAB — COMPREHENSIVE METABOLIC PANEL (CC13)
Albumin: 3.3 g/dL — ABNORMAL LOW (ref 3.5–5.0)
Alkaline Phosphatase: 97 U/L (ref 40–150)
BUN: 14 mg/dL (ref 7.0–26.0)
CO2: 27 mEq/L (ref 22–29)
Glucose: 142 mg/dl — ABNORMAL HIGH (ref 70–99)
Potassium: 3.2 mEq/L — ABNORMAL LOW (ref 3.5–5.1)
Sodium: 137 mEq/L (ref 136–145)
Total Protein: 5.7 g/dL — ABNORMAL LOW (ref 6.4–8.3)

## 2011-12-09 LAB — PROTHROMBIN TIME: INR: 2.49 — ABNORMAL HIGH (ref <1.50)

## 2011-12-09 LAB — CORRECTED CALCIUM (CC13): Calcium, Corrected: 9.5 mg/dL (ref 8.4–10.4)

## 2011-12-09 NOTE — Telephone Encounter (Signed)
Please call pt with the results of the capsule which did not show any lesion in the small bowl, specifically, no ulcer or tumor. It showed, again, gastritis, stomach inflammation.At this point we don't have any evidence of primary GI cancer.

## 2011-12-09 NOTE — Patient Instructions (Addendum)
Please have PT/INR checked at Dr. Sharyon Medicus office starting on 12/14/11 fax results to [phone] attention Corrie Dandy  Stop the Lovenox as your INR is therapeutic  I will see you back on 9/19

## 2011-12-09 NOTE — Telephone Encounter (Signed)
Patient is asking for small bowel capsule results. Please, advise

## 2011-12-10 NOTE — Telephone Encounter (Signed)
Left a message for patient to call me. 

## 2011-12-10 NOTE — Telephone Encounter (Signed)
Spoke with patient and gave her results as per Dr. Brodie. 

## 2011-12-13 ENCOUNTER — Telehealth: Payer: Self-pay | Admitting: *Deleted

## 2011-12-13 NOTE — Telephone Encounter (Signed)
     10/11, 10/18, 11/1, 11/8, 11/22, 11/29 kk  And labs  01-15-2012 02-05-2012 02-26-2012 injections

## 2011-12-13 NOTE — Progress Notes (Signed)
OFFICE PROGRESS NOTE  CC  [name], MD [address] [place] [address] Dr. Shirlean Kelly Dr. Dorothy Puffer Dr. [name]  DIAGNOSIS: 56 year old female with metastatic adenocarcinoma with brain metastasis Unknown primary but possibly an upper GI source versus lung primary  PRIOR THERAPY:  #1 patient underwent resection on 6/12 of brain met with the pathology showing a adenocarcinoma likely upper GI primary. Patient has also had SRS on 10/06/2011.  #2 patient has had multiple staging studies including PET CT which showed a 7 mm nodule in the right upper lobe which was not seen previously on CT of the chest. She subsequently was seen by Dr. Craige Cotta in pulmonary for possible bronchoscopy/biopsy.  #3 patient also has had consultation with Dr. [name] she has undergone endoscopy colonoscopy without any evidence of a primary. She has undergone capsule endoscopy results of which are pending. Patient also has had MRCP and that was negative.  #4 Patient to begin chemotherapy pending results of the capsule endoscopy. If the capsule endoscopy is negative she will begin chemotherapy consisting of Taxol carboplatinum given every 21 days for a total of 4-6 cycles. First cycle given on 11/11/11  CURRENT THERAPY: s/p Carbo Taxol cycle 2 on 12/03/11  INTERVAL HISTORY: Haley Lowe 56 y.o. female returns for Followup visit today. She tolerated cycle 2 well. She is fatigued tired but otherwise doing well. She does continue to have a cough but it is now clear. She also seems to have sinus congestion.She denies any nausea or vomiting no fevers no chills. She is having trouble sleeping at night do to leg pains and sometimes headaches. Her headaches are chronic ongoing but they have not significantly increased. She does have some diarrhea for the past 2-3 days. Patient is using electronic cigarettes once in a while. I have recommended that she discontinue this. She has no  bleeding problems. No chest pains or palpitations. Remainder of the 10 point review of systems is negative.  MEDICAL HISTORY: Past Medical History  Diagnosis Date  . Hypertension   . PONV (postoperative nausea and vomiting)   . Anxiety   . Depression   . COPD (chronic obstructive pulmonary disease)   . GERD (gastroesophageal reflux disease)   . Headache   . Arthritis   . Adenocarcinoma 09/28/2011  . Hx of adenomatous colonic polyps   . Pulmonary nodules   . Diabetes mellitus   . Emphysema   . 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 Allergy     hips and shoulders  . Cataract     1 lens implaqnt right eye,intact on left eye  . Goiter     nodule watching  . Fibromyalgia     neuropathy tops of both feet  . Metastasis to brain 09/15/11     left frontoparietal region  . History of radiation therapy 10/06/11    1 fraction  15 gray  . Pulmonary emboli 11/04/11    right upper lobe and r lower lobe PE  . Lung nodule seen on imaging study 11/04/11 CT    4mm LLL  . DVT (deep venous thrombosis) 12/09/2011  . Pulmonary embolism 12/09/2011    ALLERGIES:  is allergic to penicillins and codeine.  MEDICATIONS:  Present medications are reviewed and updated  SURGICAL HISTORY:  Past Surgical History  Procedure Date  . Abdominal hysterectomy 1996  . Cholecystectomy 2000  . Shoulder surgery 1998    right  . Eye  surgery 2001    vision correction  . Foot surgery 2005    left  . Craniotomy 09/15/2011    Procedure: CRANIOTOMY TUMOR EXCISION;  Surgeon: [name], MD;  Location: [facility];  Service: Neurosurgery;  Laterality: N/A;  Craniotomy resection of tumor with stealth  . Cataract extraction 2011    with lens implant    ECOG PERFORMANCE STATUS: 1 - Symptomatic but completely ambulatory  Blood pressure 88/67, pulse 74, temperature 98.3 F (36.8 C), resp. rate 20, height 5\' 2"  (1.575 m), weight 179 lb 8 oz (81.421 kg).  LABORATORY DATA: Lab  Results  Component Value Date   WBC 10.2 12/09/2011   HGB 12.5 12/09/2011   HCT 36.5 12/09/2011   MCV 91.9 12/09/2011   PLT 125* 12/09/2011      Chemistry      Component Value Date/Time   NA 137 12/09/2011 1231   NA 138 11/18/2011 1153   K 3.2* 12/09/2011 1231   K 3.6 11/18/2011 1153   CL 97* 12/09/2011 1231   CL 100 11/18/2011 1153   CO2 27 12/09/2011 1231   CO2 27 11/18/2011 1153   BUN 14.0 12/09/2011 1231   BUN 14 11/18/2011 1153   CREATININE 0.6 12/09/2011 1231   CREATININE 0.65 11/18/2011 1153      Component Value Date/Time   CALCIUM 8.8 11/18/2011 1153   ALKPHOS 97 12/09/2011 1231   ALKPHOS 112 11/18/2011 1153   AST 47* 12/09/2011 1231   AST 13 11/18/2011 1153   ALT 121* 12/09/2011 1231   ALT 18 11/18/2011 1153   BILITOT 0.30 12/09/2011 1231   BILITOT 0.3 11/18/2011 1153     RADIOGRAPHIC STUDIES:  ASSESSMENT: 56 year old female with  #1 metastatic adenocarcinoma of unknown primary with brain metastasis. Patient is now status post  SRS. Post procedure she is doing quite well.  #2 we have performed extensive studies to try to elucidate the primary for at the adenocarcinoma unfortunately up-to-date all scans mammograms and procedures have been negative. Patient's PET scan did show a 7 millimeter nodule that had hypermetabolic activity concerning for a lung primary in this smoker.Further evaluation will be performed by pulmonary.  #3 Patient is pulmonary embolism as well as left lower extremity DVT. She had a Greenfield filter placed just recently. Patient's INR is therapeutic and I have recommended that she discontinue the Lovenox and continue the Coumadin at 5 mg daily. She will have her INRs checked at Dr. Sharyon Medicus office and have the results faxed to [place].   PLAN:  #1 patient will return in 2 weeks' time for cycle #3 of carbo Taxol. After this we will do restaging studies. This will include CT of the chest abdomen and pelvis as well as PET scan.  #2 patient will have her INRs checked at Dr. Sharyon Medicus  office with results faxed to [place]. And then we will make adjustments in her Coumadin levels and doses.  #3 patient knows to call with any problems questions or concerns.  All questions were answered. The patient knows to call the clinic with any problems, questions or concerns. We can certainly see the patient much sooner if necessary.  I spent >25 minutes counseling the patient face to face. The total time spent in the appointment was 30 minutes.    Drue Second, MD Medical/Oncology [facility] [phone] (beeper) [phone] (Office)  12/13/2011, 11:39 AM

## 2011-12-14 ENCOUNTER — Telehealth: Payer: Self-pay | Admitting: Medical Oncology

## 2011-12-14 NOTE — Telephone Encounter (Signed)
Received PT/INR results from Dr. Sharyon Medicus office, PT 36.8, INR 3.7. Reviewed with MD  Per MD, patient to take Coumadin 2.5 mg daily and recheck labs in one week.  Patient confirmed instructions and lab appointment Tuesday 9/17 at [facility] for PT/INR recheck.  No questions at this time.

## 2011-12-21 ENCOUNTER — Telehealth: Payer: Self-pay | Admitting: *Deleted

## 2011-12-21 NOTE — Telephone Encounter (Signed)
Fax received. PT 11.1    INR 1.1 Pt currently taking 2.5mg  Coumadin daily. Forwarded to MD for review.

## 2011-12-21 NOTE — Telephone Encounter (Signed)
Per MD, notified pt to take Coumadin 5mg  daily and recheck labs in 1 week. Pt verbalized understanding.

## 2011-12-21 NOTE — Telephone Encounter (Signed)
Take 5 mg daily recheck in 1 week

## 2011-12-23 ENCOUNTER — Encounter: Payer: Self-pay | Admitting: Oncology

## 2011-12-23 ENCOUNTER — Ambulatory Visit (HOSPITAL_BASED_OUTPATIENT_CLINIC_OR_DEPARTMENT_OTHER): Payer: BC Managed Care – PPO | Admitting: Oncology

## 2011-12-23 ENCOUNTER — Telehealth: Payer: Self-pay | Admitting: *Deleted

## 2011-12-23 ENCOUNTER — Other Ambulatory Visit (HOSPITAL_BASED_OUTPATIENT_CLINIC_OR_DEPARTMENT_OTHER): Payer: BC Managed Care – PPO | Admitting: Lab

## 2011-12-23 VITALS — BP 110/74 | HR 74 | Temp 98.6°F | Resp 20 | Ht 62.0 in | Wt 179.5 lb

## 2011-12-23 DIAGNOSIS — C7931 Secondary malignant neoplasm of brain: Secondary | ICD-10-CM

## 2011-12-23 DIAGNOSIS — I2699 Other pulmonary embolism without acute cor pulmonale: Secondary | ICD-10-CM

## 2011-12-23 DIAGNOSIS — C801 Malignant (primary) neoplasm, unspecified: Secondary | ICD-10-CM

## 2011-12-23 DIAGNOSIS — F411 Generalized anxiety disorder: Secondary | ICD-10-CM

## 2011-12-23 DIAGNOSIS — I82409 Acute embolism and thrombosis of unspecified deep veins of unspecified lower extremity: Secondary | ICD-10-CM

## 2011-12-23 LAB — COMPREHENSIVE METABOLIC PANEL (CC13)
AST: 17 U/L (ref 5–34)
Albumin: 3.6 g/dL (ref 3.5–5.0)
Alkaline Phosphatase: 88 U/L (ref 40–150)
Glucose: 116 mg/dl — ABNORMAL HIGH (ref 70–99)
Potassium: 4.6 mEq/L (ref 3.5–5.1)
Sodium: 138 mEq/L (ref 136–145)
Total Bilirubin: 0.4 mg/dL (ref 0.20–1.20)
Total Protein: 6.4 g/dL (ref 6.4–8.3)

## 2011-12-23 LAB — CBC WITH DIFFERENTIAL/PLATELET
BASO%: 0.8 % (ref 0.0–2.0)
EOS%: 0.8 % (ref 0.0–7.0)
HCT: 37.5 % (ref 34.8–46.6)
MCH: 31.5 pg (ref 25.1–34.0)
MCHC: 34 g/dL (ref 31.5–36.0)
MONO#: 0.9 10*3/uL (ref 0.1–0.9)
NEUT%: 61.2 % (ref 38.4–76.8)
RBC: 4.03 10*6/uL (ref 3.70–5.45)
RDW: 16.9 % — ABNORMAL HIGH (ref 11.2–14.5)
WBC: 7.1 10*3/uL (ref 3.9–10.3)
lymph#: 1.7 10*3/uL (ref 0.9–3.3)

## 2011-12-23 LAB — PROTIME-INR
INR: 1.1 — ABNORMAL LOW (ref 2.00–3.50)
Protime: 13.2 Seconds (ref 10.6–13.4)

## 2011-12-23 NOTE — Progress Notes (Signed)
OFFICE PROGRESS NOTE  CC  [name], MD [address] [place] [address] Dr. Shirlean Kelly Dr. Dorothy Puffer Dr. [name]  DIAGNOSIS: 56 year old female with metastatic adenocarcinoma with brain metastasis Unknown primary but possibly an upper GI source versus lung primary  PRIOR THERAPY:  #1 patient underwent resection on 6/12 of brain met with the pathology showing a adenocarcinoma likely upper GI primary. Patient has also had SRS on 10/06/2011.  #2 patient has had multiple staging studies including PET CT which showed a 7 mm nodule in the right upper lobe which was not seen previously on CT of the chest. She subsequently was seen by Dr. Craige Cotta in pulmonary for possible bronchoscopy/biopsy.  #3 patient also has had consultation with Dr. [name] she has undergone endoscopy colonoscopy without any evidence of a primary. She has undergone capsule endoscopy results of which are pending. Patient also has had MRCP and that was negative.  #4 Patient to begin chemotherapy pending results of the capsule endoscopy. If the capsule endoscopy is negative she will begin chemotherapy consisting of Taxol carboplatinum given every 21 days for a total of 4-6 cycles. First cycle given on 11/11/11  CURRENT THERAPY: Patient is here for cycle #3 of carbo Taxol.  INTERVAL HISTORY: Haley Lowe 56 y.o. female returns for Followup visit today. She is accompanied by her husband as well as her daughter. Patient today is very anxious and nervous and very tearful. She again wanted to have a discussion of exactly what type of cancer I am treating. I did tell her that with we are treating a carcinoma of unknown primary possibly lung cancer as she is a smoker but we have not been able to prove this.Prior to starting the chemotherapy the whole family and I had a open discussion regarding this. I do think that the patient is becoming forgetful and this could be due to the radiation  therapy that she is receiving or even the other medications that she may be on. This is all very concerning to me.  MEDICAL HISTORY: Past Medical History  Diagnosis Date  . Hypertension   . PONV (postoperative nausea and vomiting)   . Anxiety   . Depression   . COPD (chronic obstructive pulmonary disease)   . GERD (gastroesophageal reflux disease)   . Headache   . Arthritis   . Adenocarcinoma 09/28/2011  . Hx of adenomatous colonic polyps   . Pulmonary nodules   . Diabetes mellitus   . Emphysema   . 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 Allergy     hips and shoulders  . Cataract     1 lens implaqnt right eye,intact on left eye  . Goiter     nodule watching  . Fibromyalgia     neuropathy tops of both feet  . Metastasis to brain 09/15/11     left frontoparietal region  . History of radiation therapy 10/06/11    1 fraction  15 gray  . Pulmonary emboli 11/04/11    right upper lobe and r lower lobe PE  . Lung nodule seen on imaging study 11/04/11 CT    4mm LLL  . DVT (deep venous thrombosis) 12/09/2011  . Pulmonary embolism 12/09/2011    ALLERGIES:  is allergic to penicillins and codeine.  MEDICATIONS:  Present medications are reviewed and updated  SURGICAL HISTORY:  Past Surgical History  Procedure Date  . Abdominal hysterectomy 1996  . Cholecystectomy 2000  .  Shoulder surgery 1998    right  . Eye surgery 2001    vision correction  . Foot surgery 2005    left  . Craniotomy 09/15/2011    Procedure: CRANIOTOMY TUMOR EXCISION;  Surgeon: [name], MD;  Location: [facility];  Service: Neurosurgery;  Laterality: N/A;  Craniotomy resection of tumor with stealth  . Cataract extraction 2011    with lens implant    ECOG PERFORMANCE STATUS: 1 - Symptomatic but completely ambulatory  Blood pressure 110/74, pulse 74, temperature 98.6 F (37 C), temperature source Oral, resp. rate 20, height 5\' 2"  (1.575 m), weight 179 lb 8 oz (81.421  kg).  LABORATORY DATA: Lab Results  Component Value Date   WBC 7.1 12/23/2011   HGB 12.7 12/23/2011   HCT 37.5 12/23/2011   MCV 92.8 12/23/2011   PLT 289 12/23/2011      Chemistry      Component Value Date/Time   NA 138 12/23/2011 1137   NA 138 11/18/2011 1153   K 4.6 12/23/2011 1137   K 3.6 11/18/2011 1153   CL 101 12/23/2011 1137   CL 100 11/18/2011 1153   CO2 27 12/23/2011 1137   CO2 27 11/18/2011 1153   BUN 12.0 12/23/2011 1137   BUN 14 11/18/2011 1153   CREATININE 0.8 12/23/2011 1137   CREATININE 0.65 11/18/2011 1153      Component Value Date/Time   CALCIUM 9.5 12/23/2011 1137   CALCIUM 8.8 11/18/2011 1153   ALKPHOS 88 12/23/2011 1137   ALKPHOS 112 11/18/2011 1153   AST 17 12/23/2011 1137   AST 13 11/18/2011 1153   ALT 23 12/23/2011 1137   ALT 18 11/18/2011 1153   BILITOT 0.40 12/23/2011 1137   BILITOT 0.3 11/18/2011 1153     RADIOGRAPHIC STUDIES:  ASSESSMENT: 56 year old female with  #1 metastatic adenocarcinoma of unknown primary with brain metastasis. Patient is now status post  SRS. Post procedure she is doing quite well.  #2 we have performed extensive studies to try to elucidate the primary for at the adenocarcinoma unfortunately up-to-date all scans mammograms and procedures have been negative. Patient's PET scan did show a 7 millimeter nodule that had hypermetabolic activity concerning for a lung primary in this smoker.Further evaluation will be performed by pulmonary.  #3 Patient is pulmonary embolism as well as left lower extremity DVT. She had a Greenfield filter placed just recently. Patient's INR is therapeutic and I have recommended that she discontinue the Lovenox and continue the Coumadin at 5 mg daily. She will have her INRs checked at Dr. Sharyon Medicus office and have the results faxed to [place].  #4 patient  Is very and extremely anxious about her diagnosis she still thinks that she is curable however she and I. And her daughter had a very extensive discussion right from the  beginning that this is not a curable disease and that we will continue to treat her as long as she is able to tolerate the chemotherapy. We certainly do plan on doing restaging studies and this will calm after her third cycle of the chemotherapy. I did explain to the patient that we are not sure exactly what we are treating we could possibly be treating her lung cancer. Certainly her PET scan did have increased FDG uptake in the right lung. And in spite of my asking the pulmonary medicine folks to possibly do a biopsy did do think that the biopsy was not feasible. I am hoping that she will have had a  response with the next PET scan and this will encourage her to keep going. Patient has the impression that she will not need more than 3 chemotherapies and we again had an extensive discussion regarding this as well.  PLAN:  #1 patient will proceed with cycle #3 of her chemotherapy today.  #2 we will set her up PET/CT scans To see a response to treatment especially in the lung.  #3 patient's daughter would like Dr. Sharyon Medicus office to manage the Coumadin dosing and I do think that that is very appropriate.  #4 patient has a significant amount of anxiety. We had an extensive discussion today regarding patient's again diagnosis. I have explained to her that this is most likely a carcinoma of unknown primary although the working diagnosis may potentially be primary lung malignancy since she is a smoker.  However I do think that this is very difficult concept for the patient to grasp at this time and I am very concerned that we may not be able to explain this as much as she would want me to. I did offer to send her for a second opinion. She certainly could have an opinion here or she could be sent out of the institution. She is going to think about this and get back to me.  All questions were answered. The patient knows to call the clinic with any problems, questions or concerns. We can certainly see the patient  much sooner if necessary.  I spent >25 minutes counseling the patient face to face. The total time spent in the appointment was 30 minutes.    Drue Second, MD Medical/Oncology [facility] [phone] (beeper) [phone] (Office)  12/23/2011, 1:31 PM

## 2011-12-23 NOTE — Patient Instructions (Addendum)
Proceed with PET scan in October  I will see you back as scheduled

## 2011-12-23 NOTE — Telephone Encounter (Signed)
01-06-2012 arrival time 8:15am pet scan

## 2011-12-24 ENCOUNTER — Other Ambulatory Visit: Payer: BC Managed Care – PPO | Admitting: Lab

## 2011-12-24 ENCOUNTER — Ambulatory Visit: Payer: BC Managed Care – PPO | Admitting: Oncology

## 2011-12-24 ENCOUNTER — Ambulatory Visit (HOSPITAL_BASED_OUTPATIENT_CLINIC_OR_DEPARTMENT_OTHER): Payer: BC Managed Care – PPO

## 2011-12-24 VITALS — BP 110/78 | HR 74 | Temp 97.5°F | Resp 17

## 2011-12-24 DIAGNOSIS — C7949 Secondary malignant neoplasm of other parts of nervous system: Secondary | ICD-10-CM

## 2011-12-24 DIAGNOSIS — Z5111 Encounter for antineoplastic chemotherapy: Secondary | ICD-10-CM

## 2011-12-24 DIAGNOSIS — C801 Malignant (primary) neoplasm, unspecified: Secondary | ICD-10-CM

## 2011-12-24 DIAGNOSIS — C7931 Secondary malignant neoplasm of brain: Secondary | ICD-10-CM

## 2011-12-24 MED ORDER — HEPARIN SOD (PORK) LOCK FLUSH 100 UNIT/ML IV SOLN
500.0000 [IU] | Freq: Once | INTRAVENOUS | Status: AC | PRN
  Administered 2011-12-24: 500 [IU]
  Filled 2011-12-24: qty 5

## 2011-12-24 MED ORDER — PACLITAXEL CHEMO INJECTION 300 MG/50ML
175.0000 mg/m2 | Freq: Once | INTRAVENOUS | Status: AC
  Administered 2011-12-24: 324 mg via INTRAVENOUS
  Filled 2011-12-24: qty 54

## 2011-12-24 MED ORDER — SODIUM CHLORIDE 0.9 % IJ SOLN
10.0000 mL | INTRAMUSCULAR | Status: DC | PRN
  Administered 2011-12-24: 10 mL
  Filled 2011-12-24: qty 10

## 2011-12-24 MED ORDER — SODIUM CHLORIDE 0.9 % IV SOLN
Freq: Once | INTRAVENOUS | Status: AC
  Administered 2011-12-24: 09:00:00 via INTRAVENOUS

## 2011-12-24 MED ORDER — FAMOTIDINE IN NACL 20-0.9 MG/50ML-% IV SOLN
20.0000 mg | Freq: Once | INTRAVENOUS | Status: AC
  Administered 2011-12-24: 20 mg via INTRAVENOUS

## 2011-12-24 MED ORDER — DIPHENHYDRAMINE HCL 50 MG/ML IJ SOLN
50.0000 mg | Freq: Once | INTRAMUSCULAR | Status: AC
  Administered 2011-12-24: 50 mg via INTRAVENOUS

## 2011-12-24 MED ORDER — LORAZEPAM 2 MG/ML IJ SOLN
0.5000 mg | Freq: Once | INTRAMUSCULAR | Status: AC
  Administered 2011-12-24: 0.5 mg via INTRAVENOUS

## 2011-12-24 MED ORDER — DEXAMETHASONE SODIUM PHOSPHATE 4 MG/ML IJ SOLN
20.0000 mg | Freq: Once | INTRAMUSCULAR | Status: AC
  Administered 2011-12-24: 20 mg via INTRAVENOUS

## 2011-12-24 MED ORDER — SODIUM CHLORIDE 0.9 % IV SOLN
640.0000 mg | Freq: Once | INTRAVENOUS | Status: AC
  Administered 2011-12-24: 640 mg via INTRAVENOUS
  Filled 2011-12-24: qty 64

## 2011-12-24 MED ORDER — ONDANSETRON 16 MG/50ML IVPB (CHCC)
16.0000 mg | Freq: Once | INTRAVENOUS | Status: AC
  Administered 2011-12-24: 16 mg via INTRAVENOUS

## 2011-12-24 NOTE — Patient Instructions (Addendum)
[facility] Discharge Instructions for Patients Receiving Chemotherapy  Today you received the following chemotherapy agents Taxol and Carboplatin  To help prevent nausea and vomiting after your treatment, we encourage you to take your nausea medication. Begin taking your nausea medication as often as prescribed for by Dr Khan.    If you develop nausea and vomiting that is not controlled by your nausea medication, call the clinic. If it is after clinic hours your family physician or the after hours number for the clinic or go to the Emergency Department.   BELOW ARE SYMPTOMS THAT SHOULD BE REPORTED IMMEDIATELY:  *FEVER GREATER THAN 100.5 F  *CHILLS WITH OR WITHOUT FEVER  NAUSEA AND VOMITING THAT IS NOT CONTROLLED WITH YOUR NAUSEA MEDICATION  *UNUSUAL SHORTNESS OF BREATH  *UNUSUAL BRUISING OR BLEEDING  TENDERNESS IN MOUTH AND THROAT WITH OR WITHOUT PRESENCE OF ULCERS  *URINARY PROBLEMS  *BOWEL PROBLEMS  UNUSUAL RASH Items with * indicate a potential emergency and should be followed up as soon as possible.  One of the nurses will contact you 24 hours after your treatment. Please let the nurse know about any problems that you may have experienced. Feel free to call the clinic you have any questions or concerns. The clinic phone number is [phone].   I have been informed and understand all the instructions given to me. I know to contact the clinic, my physician, or go to the Emergency Department if any problems should occur. I do not have any questions at this time, but understand that I may call the clinic during office hours   should I have any questions or need assistance in obtaining follow up care.    __________________________________________  _____________  __________ Signature of Patient or Authorized Representative            Date                   Time    __________________________________________ Nurse's Signature    

## 2011-12-25 ENCOUNTER — Ambulatory Visit (HOSPITAL_BASED_OUTPATIENT_CLINIC_OR_DEPARTMENT_OTHER): Payer: BC Managed Care – PPO

## 2011-12-25 DIAGNOSIS — Z5189 Encounter for other specified aftercare: Secondary | ICD-10-CM

## 2011-12-25 DIAGNOSIS — C7931 Secondary malignant neoplasm of brain: Secondary | ICD-10-CM

## 2011-12-25 DIAGNOSIS — C801 Malignant (primary) neoplasm, unspecified: Secondary | ICD-10-CM

## 2011-12-25 MED ORDER — PEGFILGRASTIM INJECTION 6 MG/0.6ML
6.0000 mg | Freq: Once | SUBCUTANEOUS | Status: AC
  Administered 2011-12-25: 6 mg via SUBCUTANEOUS

## 2011-12-31 ENCOUNTER — Ambulatory Visit (HOSPITAL_BASED_OUTPATIENT_CLINIC_OR_DEPARTMENT_OTHER): Payer: BC Managed Care – PPO | Admitting: Oncology

## 2011-12-31 ENCOUNTER — Telehealth: Payer: Self-pay | Admitting: *Deleted

## 2011-12-31 ENCOUNTER — Other Ambulatory Visit (HOSPITAL_BASED_OUTPATIENT_CLINIC_OR_DEPARTMENT_OTHER): Payer: BC Managed Care – PPO | Admitting: Lab

## 2011-12-31 VITALS — BP 106/79 | HR 80 | Temp 98.4°F | Resp 18 | Ht 62.0 in | Wt 180.3 lb

## 2011-12-31 DIAGNOSIS — C801 Malignant (primary) neoplasm, unspecified: Secondary | ICD-10-CM

## 2011-12-31 DIAGNOSIS — Z5181 Encounter for therapeutic drug level monitoring: Secondary | ICD-10-CM

## 2011-12-31 DIAGNOSIS — Z7901 Long term (current) use of anticoagulants: Secondary | ICD-10-CM

## 2011-12-31 DIAGNOSIS — I82409 Acute embolism and thrombosis of unspecified deep veins of unspecified lower extremity: Secondary | ICD-10-CM

## 2011-12-31 DIAGNOSIS — Z86718 Personal history of other venous thrombosis and embolism: Secondary | ICD-10-CM

## 2011-12-31 DIAGNOSIS — C7931 Secondary malignant neoplasm of brain: Secondary | ICD-10-CM

## 2011-12-31 DIAGNOSIS — Z86711 Personal history of pulmonary embolism: Secondary | ICD-10-CM

## 2011-12-31 DIAGNOSIS — C7949 Secondary malignant neoplasm of other parts of nervous system: Secondary | ICD-10-CM

## 2011-12-31 DIAGNOSIS — R911 Solitary pulmonary nodule: Secondary | ICD-10-CM

## 2011-12-31 DIAGNOSIS — I2699 Other pulmonary embolism without acute cor pulmonale: Secondary | ICD-10-CM

## 2011-12-31 LAB — PROTIME-INR
INR: 1.5 — ABNORMAL LOW (ref 2.00–3.50)
Protime: 18 Seconds — ABNORMAL HIGH (ref 10.6–13.4)

## 2011-12-31 LAB — CBC WITH DIFFERENTIAL/PLATELET
BASO%: 0.5 % (ref 0.0–2.0)
EOS%: 0.8 % (ref 0.0–7.0)
LYMPH%: 10.2 % — ABNORMAL LOW (ref 14.0–49.7)
MCH: 31.7 pg (ref 25.1–34.0)
MCHC: 33.8 g/dL (ref 31.5–36.0)
MCV: 93.8 fL (ref 79.5–101.0)
MONO%: 6.8 % (ref 0.0–14.0)
NEUT#: 22.1 10*3/uL — ABNORMAL HIGH (ref 1.5–6.5)
RBC: 4.05 10*6/uL (ref 3.70–5.45)
RDW: 16.8 % — ABNORMAL HIGH (ref 11.2–14.5)

## 2011-12-31 LAB — COMPREHENSIVE METABOLIC PANEL (CC13)
Albumin: 3.6 g/dL (ref 3.5–5.0)
Alkaline Phosphatase: 143 U/L (ref 40–150)
BUN: 12 mg/dL (ref 7.0–26.0)
CO2: 28 mEq/L (ref 22–29)
Glucose: 120 mg/dl — ABNORMAL HIGH (ref 70–99)
Sodium: 140 mEq/L (ref 136–145)
Total Bilirubin: 0.3 mg/dL (ref 0.20–1.20)
Total Protein: 6.3 g/dL — ABNORMAL LOW (ref 6.4–8.3)

## 2011-12-31 NOTE — Telephone Encounter (Signed)
As of 12-31-2011 nothing needs to be scheduled for the patient

## 2011-12-31 NOTE — Patient Instructions (Addendum)
Doing well after 3rd cycle of your chemotherapy  Keep yourself well hydrated  I will see you back on 01/13/12  PET scan scheduled for 10/3

## 2012-01-02 NOTE — Progress Notes (Signed)
OFFICE PROGRESS NOTE  CC  [name], MD [address] [place] [address] Dr. Shirlean Kelly Dr. Dorothy Puffer Dr. [name]  DIAGNOSIS: 56 year old female with metastatic adenocarcinoma with brain metastasis Unknown primary but possibly an upper GI source versus lung primary  PRIOR THERAPY:  #1 patient underwent resection on 6/12 of brain met with the pathology showing a adenocarcinoma likely upper GI primary. Patient has also had SRS on 10/06/2011.  #2 patient has had multiple staging studies including PET CT which showed a 7 mm nodule in the right upper lobe which was not seen previously on CT of the chest. She subsequently was seen by Dr. Craige Cotta in pulmonary for possible bronchoscopy/biopsy.  #3 patient also has had consultation with Dr. [name] she has undergone endoscopy colonoscopy without any evidence of a primary. She has undergone capsule endoscopy results of which are pending. Patient also has had MRCP and that was negative.  #4 Patient to begin chemotherapy pending results of the capsule endoscopy. If the capsule endoscopy is negative she will begin chemotherapy consisting of Taxol carboplatinum given every 21 days for a total of 4-6 cycles. First cycle given on 11/11/11  CURRENT THERAPY: Status post cycle #3 of carbo Taxol.  INTERVAL HISTORY: Haley Lowe 56 y.o. female returns for Followup visit today. She is accompanied by her husband.Patient tells me that she has quit smoking her electronic cigarettes. She also tells me that she feels a little bit better. They have made a decision for a second opinion which I will call him. I will make to do referral. She tolerated cycle 3 of carbo Taxol well. I have set her up for a restaging PET/CT. She denies any nausea vomiting fevers chills or night sweats. Remainder of the 10 point review of systems is negative. MEDICAL HISTORY: Past Medical History  Diagnosis Date  . Hypertension   . PONV  (postoperative nausea and vomiting)   . Anxiety   . Depression   . COPD (chronic obstructive pulmonary disease)   . GERD (gastroesophageal reflux disease)   . Headache   . Arthritis   . Adenocarcinoma 09/28/2011  . Hx of adenomatous colonic polyps   . Pulmonary nodules   . Diabetes mellitus   . Emphysema   . 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 Allergy     hips and shoulders  . Cataract     1 lens implaqnt right eye,intact on left eye  . Goiter     nodule watching  . Fibromyalgia     neuropathy tops of both feet  . Metastasis to brain 09/15/11     left frontoparietal region  . History of radiation therapy 10/06/11    1 fraction  15 gray  . Pulmonary emboli 11/04/11    right upper lobe and r lower lobe PE  . Lung nodule seen on imaging study 11/04/11 CT    4mm LLL  . DVT (deep venous thrombosis) 12/09/2011  . Pulmonary embolism 12/09/2011    ALLERGIES:  is allergic to penicillins and codeine.  MEDICATIONS:  Present medications are reviewed and updated  SURGICAL HISTORY:  Past Surgical History  Procedure Date  . Abdominal hysterectomy 1996  . Cholecystectomy 2000  . Shoulder surgery 1998    right  . Eye surgery 2001    vision correction  . Foot surgery 2005    left  . Craniotomy 09/15/2011    Procedure: CRANIOTOMY TUMOR EXCISION;  Surgeon:  [name], MD;  Location: [facility];  Service: Neurosurgery;  Laterality: N/A;  Craniotomy resection of tumor with stealth  . Cataract extraction 2011    with lens implant    ECOG PERFORMANCE STATUS: 1 - Symptomatic but completely ambulatory HEENT exam EOMI PERRLA sclerae anicteric no conjunctival pallor oral mucosa is moist neck is supple lungs are clear bilaterally to auscultation cardiovascular is regular rate rhythm abdomen is soft nontender nondistended bowel sounds are present no HSM extremities no edema neuro patient's alert oriented otherwise nonfocal. Blood pressure 106/79, pulse 80,  temperature 98.4 F (36.9 C), temperature source Oral, resp. rate 18, height 5\' 2"  (1.575 m), weight 180 lb 4.8 oz (81.784 kg).  LABORATORY DATA: Lab Results  Component Value Date   WBC 27.0* 12/31/2011   HGB 12.8 12/31/2011   HCT 38.0 12/31/2011   MCV 93.8 12/31/2011   PLT 235 12/31/2011      Chemistry      Component Value Date/Time   NA 140 12/31/2011 1313   NA 138 11/18/2011 1153   K 3.5 12/31/2011 1313   K 3.6 11/18/2011 1153   CL 101 12/31/2011 1313   CL 100 11/18/2011 1153   CO2 28 12/31/2011 1313   CO2 27 11/18/2011 1153   BUN 12.0 12/31/2011 1313   BUN 14 11/18/2011 1153   CREATININE 0.7 12/31/2011 1313   CREATININE 0.65 11/18/2011 1153      Component Value Date/Time   CALCIUM 9.8 12/31/2011 1313   CALCIUM 8.8 11/18/2011 1153   ALKPHOS 143 12/31/2011 1313   ALKPHOS 112 11/18/2011 1153   AST 14 12/31/2011 1313   AST 13 11/18/2011 1153   ALT 21 12/31/2011 1313   ALT 18 11/18/2011 1153   BILITOT 0.30 12/31/2011 1313   BILITOT 0.3 11/18/2011 1153     RADIOGRAPHIC STUDIES:  ASSESSMENT: 56 year old female with  #1 metastatic adenocarcinoma of unknown primary with brain metastasis. Patient is now status post  SRS. Post procedure she is doing quite well.  #2 we have performed extensive studies to try to elucidate the primary for at the adenocarcinoma unfortunately up-to-date all scans mammograms and procedures have been negative. Patient's PET scan did show a 7 millimeter nodule that had hypermetabolic activity concerning for a lung primary in this smoker.Further evaluation will be performed by pulmonary.  #3 Patient is pulmonary embolism as well as left lower extremity DVT. She had a Greenfield filter placed just recently. Patient's INR is therapeutic and I have recommended that she discontinue the Lovenox and continue the Coumadin at 5 mg daily. She will have her INRs checked at Dr. Sharyon Medicus office and have the results faxed to [place].  #4 patient  Is very and extremely anxious about her  diagnosis she still thinks that she is curable however she and I. And her daughter had a very extensive discussion right from the beginning that this is not a curable disease and that we will continue to treat her as long as she is able to tolerate the chemotherapy. We certainly do plan on doing restaging studies and this will calm after her third cycle of the chemotherapy. I did explain to the patient that we are not sure exactly what we are treating we could possibly be treating her lung cancer. Certainly her PET scan did have increased FDG uptake in the right lung. And in spite of my asking the pulmonary medicine folks to possibly do a biopsy did do think that the biopsy was not feasible. I am  hoping that she will have had a response with the next PET scan and this will encourage her to keep going. Patient has the impression that she will not need more than 3 chemotherapies and we again had an extensive discussion regarding this as well.  PLAN:  #1 patient has tolerated cycle 3 of her chemotherapy very well.  #2 patient and her husband are asking for a referral for a second opinion and I certainly will make that referral. They would like to stay within the [facility] system and therefore I will refer her to Dr. Shirline Frees one of my partners.   #3 she will return in 2 weeks' time for followup. She also has PET CT scan scheduled For restaging purposes  All questions were answered. The patient knows to call the clinic with any problems, questions or concerns. We can certainly see the patient much sooner if necessary.  I spent 15 minutes counseling the patient face to face. The total time spent in the appointment was 30 minutes.    Drue Second, MD Medical/Oncology [facility] [phone] (beeper) [phone] (Office)

## 2012-01-04 ENCOUNTER — Other Ambulatory Visit: Payer: Self-pay | Admitting: Interventional Radiology

## 2012-01-04 DIAGNOSIS — Z95828 Presence of other vascular implants and grafts: Secondary | ICD-10-CM

## 2012-01-05 ENCOUNTER — Ambulatory Visit: Payer: BC Managed Care – PPO

## 2012-01-06 ENCOUNTER — Encounter (HOSPITAL_COMMUNITY)
Admission: RE | Admit: 2012-01-06 | Discharge: 2012-01-06 | Disposition: A | Discharge status: Home / Self Care | Payer: BC Managed Care – PPO | Source: Ambulatory Visit | Attending: Oncology | Admitting: Oncology

## 2012-01-06 ENCOUNTER — Encounter (HOSPITAL_COMMUNITY): Payer: Self-pay

## 2012-01-06 ENCOUNTER — Other Ambulatory Visit: Payer: BC Managed Care – PPO

## 2012-01-06 DIAGNOSIS — C3490 Malignant neoplasm of unspecified part of unspecified bronchus or lung: Secondary | ICD-10-CM | POA: Insufficient documentation

## 2012-01-06 DIAGNOSIS — C7931 Secondary malignant neoplasm of brain: Secondary | ICD-10-CM

## 2012-01-06 DIAGNOSIS — Z9221 Personal history of antineoplastic chemotherapy: Secondary | ICD-10-CM | POA: Insufficient documentation

## 2012-01-06 DIAGNOSIS — R918 Other nonspecific abnormal finding of lung field: Secondary | ICD-10-CM | POA: Insufficient documentation

## 2012-01-06 DIAGNOSIS — C801 Malignant (primary) neoplasm, unspecified: Secondary | ICD-10-CM

## 2012-01-06 LAB — GLUCOSE, CAPILLARY: Glucose-Capillary: 115 mg/dL — ABNORMAL HIGH (ref 70–99)

## 2012-01-06 MED ORDER — FLUDEOXYGLUCOSE F - 18 (FDG) INJECTION
15.7000 | Freq: Once | INTRAVENOUS | Status: AC | PRN
  Administered 2012-01-06: 15.7 via INTRAVENOUS

## 2012-01-06 NOTE — Progress Notes (Signed)
Quick Note:  Please call patient: PET scan shows the lesion in the lung is no longer lighting up ______

## 2012-01-07 ENCOUNTER — Telehealth: Payer: Self-pay | Admitting: *Deleted

## 2012-01-07 ENCOUNTER — Other Ambulatory Visit: Payer: BC Managed Care – PPO

## 2012-01-07 NOTE — Telephone Encounter (Signed)
[name] ask that we call her.  Mom is upset and worried reporting she received a call with abnormal PET results.  "She said something about nodules and lumps on her liver."  I need a call to have results explained to me so I can calm her down.  Will notify staff.

## 2012-01-11 ENCOUNTER — Other Ambulatory Visit (HOSPITAL_COMMUNITY): Payer: Self-pay | Admitting: Internal Medicine

## 2012-01-11 DIAGNOSIS — E042 Nontoxic multinodular goiter: Secondary | ICD-10-CM

## 2012-01-13 ENCOUNTER — Other Ambulatory Visit (HOSPITAL_BASED_OUTPATIENT_CLINIC_OR_DEPARTMENT_OTHER): Payer: BC Managed Care – PPO | Admitting: Lab

## 2012-01-13 ENCOUNTER — Other Ambulatory Visit (HOSPITAL_COMMUNITY): Payer: BC Managed Care – PPO

## 2012-01-13 ENCOUNTER — Ambulatory Visit (HOSPITAL_BASED_OUTPATIENT_CLINIC_OR_DEPARTMENT_OTHER): Payer: BC Managed Care – PPO | Admitting: Oncology

## 2012-01-13 VITALS — BP 112/76 | HR 83 | Temp 98.8°F | Resp 20 | Ht 62.0 in | Wt 184.4 lb

## 2012-01-13 DIAGNOSIS — C7931 Secondary malignant neoplasm of brain: Secondary | ICD-10-CM

## 2012-01-13 DIAGNOSIS — Z7901 Long term (current) use of anticoagulants: Secondary | ICD-10-CM

## 2012-01-13 DIAGNOSIS — Z5181 Encounter for therapeutic drug level monitoring: Secondary | ICD-10-CM

## 2012-01-13 DIAGNOSIS — I2699 Other pulmonary embolism without acute cor pulmonale: Secondary | ICD-10-CM

## 2012-01-13 DIAGNOSIS — I82409 Acute embolism and thrombosis of unspecified deep veins of unspecified lower extremity: Secondary | ICD-10-CM

## 2012-01-13 DIAGNOSIS — C801 Malignant (primary) neoplasm, unspecified: Secondary | ICD-10-CM

## 2012-01-13 LAB — PROTIME-INR
INR: 2.2 (ref 2.00–3.50)
Protime: 26.4 Seconds — ABNORMAL HIGH (ref 10.6–13.4)

## 2012-01-13 LAB — CBC WITH DIFFERENTIAL/PLATELET
BASO%: 0.6 % (ref 0.0–2.0)
Eosinophils Absolute: 0 10*3/uL (ref 0.0–0.5)
HCT: 36.4 % (ref 34.8–46.6)
MCHC: 34.3 g/dL (ref 31.5–36.0)
MONO#: 0.6 10*3/uL (ref 0.1–0.9)
NEUT#: 2.6 10*3/uL (ref 1.5–6.5)
RBC: 3.85 10*6/uL (ref 3.70–5.45)
WBC: 4.5 10*3/uL (ref 3.9–10.3)
lymph#: 1.3 10*3/uL (ref 0.9–3.3)

## 2012-01-13 LAB — COMPREHENSIVE METABOLIC PANEL (CC13)
ALT: 32 U/L (ref 0–55)
Alkaline Phosphatase: 92 U/L (ref 40–150)
Sodium: 138 mEq/L (ref 136–145)
Total Bilirubin: 0.4 mg/dL (ref 0.20–1.20)
Total Protein: 6.2 g/dL — ABNORMAL LOW (ref 6.4–8.3)

## 2012-01-14 ENCOUNTER — Ambulatory Visit (HOSPITAL_BASED_OUTPATIENT_CLINIC_OR_DEPARTMENT_OTHER): Payer: BC Managed Care – PPO

## 2012-01-14 VITALS — BP 109/76 | HR 71 | Temp 98.5°F | Resp 20

## 2012-01-14 DIAGNOSIS — F411 Generalized anxiety disorder: Secondary | ICD-10-CM

## 2012-01-14 DIAGNOSIS — C7931 Secondary malignant neoplasm of brain: Secondary | ICD-10-CM

## 2012-01-14 DIAGNOSIS — C801 Malignant (primary) neoplasm, unspecified: Secondary | ICD-10-CM

## 2012-01-14 DIAGNOSIS — Z5111 Encounter for antineoplastic chemotherapy: Secondary | ICD-10-CM

## 2012-01-14 MED ORDER — PACLITAXEL CHEMO INJECTION 300 MG/50ML
175.0000 mg/m2 | Freq: Once | INTRAVENOUS | Status: AC
  Administered 2012-01-14: 324 mg via INTRAVENOUS
  Filled 2012-01-14: qty 54

## 2012-01-14 MED ORDER — SODIUM CHLORIDE 0.9 % IV SOLN
Freq: Once | INTRAVENOUS | Status: AC
  Administered 2012-01-14: 10:00:00 via INTRAVENOUS

## 2012-01-14 MED ORDER — LORAZEPAM 2 MG/ML IJ SOLN
0.5000 mg | Freq: Once | INTRAMUSCULAR | Status: AC
  Administered 2012-01-14: 0.5 mg via INTRAVENOUS

## 2012-01-14 MED ORDER — FAMOTIDINE IN NACL 20-0.9 MG/50ML-% IV SOLN
20.0000 mg | Freq: Once | INTRAVENOUS | Status: AC
  Administered 2012-01-14: 20 mg via INTRAVENOUS

## 2012-01-14 MED ORDER — SODIUM CHLORIDE 0.9 % IV SOLN
750.0000 mg | Freq: Once | INTRAVENOUS | Status: AC
  Administered 2012-01-14: 750 mg via INTRAVENOUS
  Filled 2012-01-14: qty 75

## 2012-01-14 MED ORDER — SODIUM CHLORIDE 0.9 % IJ SOLN
10.0000 mL | INTRAMUSCULAR | Status: DC | PRN
  Administered 2012-01-14: 10 mL
  Filled 2012-01-14: qty 10

## 2012-01-14 MED ORDER — DEXAMETHASONE SODIUM PHOSPHATE 4 MG/ML IJ SOLN
20.0000 mg | Freq: Once | INTRAMUSCULAR | Status: AC
  Administered 2012-01-14: 20 mg via INTRAVENOUS

## 2012-01-14 MED ORDER — ONDANSETRON 16 MG/50ML IVPB (CHCC)
16.0000 mg | Freq: Once | INTRAVENOUS | Status: AC
  Administered 2012-01-14: 16 mg via INTRAVENOUS

## 2012-01-14 MED ORDER — DIPHENHYDRAMINE HCL 50 MG/ML IJ SOLN
50.0000 mg | Freq: Once | INTRAMUSCULAR | Status: AC
  Administered 2012-01-14: 50 mg via INTRAVENOUS

## 2012-01-14 MED ORDER — HEPARIN SOD (PORK) LOCK FLUSH 100 UNIT/ML IV SOLN
500.0000 [IU] | Freq: Once | INTRAVENOUS | Status: AC | PRN
  Administered 2012-01-14: 500 [IU]
  Filled 2012-01-14: qty 5

## 2012-01-14 NOTE — Patient Instructions (Addendum)
[facility] Discharge Instructions for Patients Receiving Chemotherapy  Today you received the following chemotherapy agents Taxol and Carboplatin.  To help prevent nausea and vomiting after your treatment, we encourage you to take your nausea medications as prescribed.   If you develop nausea and vomiting that is not controlled by your nausea medication, call the clinic. If it is after clinic hours your family physician or the after hours number for the clinic or go to the Emergency Department.   BELOW ARE SYMPTOMS THAT SHOULD BE REPORTED IMMEDIATELY:  *FEVER GREATER THAN 100.5 F  *CHILLS WITH OR WITHOUT FEVER  NAUSEA AND VOMITING THAT IS NOT CONTROLLED WITH YOUR NAUSEA MEDICATION  *UNUSUAL SHORTNESS OF BREATH  *UNUSUAL BRUISING OR BLEEDING  TENDERNESS IN MOUTH AND THROAT WITH OR WITHOUT PRESENCE OF ULCERS  *URINARY PROBLEMS  *BOWEL PROBLEMS  UNUSUAL RASH Items with * indicate a potential emergency and should be followed up as soon as possible.  One of the nurses will contact you 24 hours after your treatment. Please let the nurse know about any problems that you may have experienced. Feel free to call the clinic you have any questions or concerns. The clinic phone number is [phone].   I have been informed and understand all the instructions given to me. I know to contact the clinic, my physician, or go to the Emergency Department if any problems should occur. I do not have any questions at this time, but understand that I may call the clinic during office hours   should I have any questions or need assistance in obtaining follow up care.    __________________________________________  _____________  __________ Signature of Patient or Authorized Representative            Date                   Time    __________________________________________ Nurse's Signature

## 2012-01-15 ENCOUNTER — Ambulatory Visit (HOSPITAL_BASED_OUTPATIENT_CLINIC_OR_DEPARTMENT_OTHER): Payer: BC Managed Care – PPO

## 2012-01-15 VITALS — BP 135/87 | HR 98 | Temp 98.6°F | Resp 20

## 2012-01-15 DIAGNOSIS — C801 Malignant (primary) neoplasm, unspecified: Secondary | ICD-10-CM

## 2012-01-15 DIAGNOSIS — C7931 Secondary malignant neoplasm of brain: Secondary | ICD-10-CM

## 2012-01-15 MED ORDER — PEGFILGRASTIM INJECTION 6 MG/0.6ML
6.0000 mg | Freq: Once | SUBCUTANEOUS | Status: AC
  Administered 2012-01-15: 6 mg via SUBCUTANEOUS

## 2012-01-18 ENCOUNTER — Telehealth: Payer: Self-pay | Admitting: *Deleted

## 2012-01-18 ENCOUNTER — Ambulatory Visit (HOSPITAL_COMMUNITY)
Admission: RE | Admit: 2012-01-18 | Discharge: 2012-01-18 | Disposition: A | Discharge status: Home / Self Care | Payer: BC Managed Care – PPO | Source: Ambulatory Visit | Attending: Internal Medicine | Admitting: Internal Medicine

## 2012-01-18 DIAGNOSIS — E042 Nontoxic multinodular goiter: Secondary | ICD-10-CM

## 2012-01-18 DIAGNOSIS — Z808 Family history of malignant neoplasm of other organs or systems: Secondary | ICD-10-CM | POA: Insufficient documentation

## 2012-01-18 NOTE — Telephone Encounter (Signed)
Spoke with daughter regarding appt time and place.  She verbalized understanding of appt

## 2012-01-18 NOTE — Telephone Encounter (Signed)
Left message with pt and daughter regarding appt with Dr. Arbutus Ped

## 2012-01-21 ENCOUNTER — Encounter: Payer: Self-pay | Admitting: Adult Health

## 2012-01-21 ENCOUNTER — Ambulatory Visit
Admission: RE | Admit: 2012-01-21 | Discharge: 2012-01-21 | Disposition: A | Discharge status: Home / Self Care | Payer: BC Managed Care – PPO | Source: Ambulatory Visit | Attending: Radiation Oncology | Admitting: Radiation Oncology

## 2012-01-21 ENCOUNTER — Other Ambulatory Visit (HOSPITAL_BASED_OUTPATIENT_CLINIC_OR_DEPARTMENT_OTHER): Payer: BC Managed Care – PPO | Admitting: Lab

## 2012-01-21 ENCOUNTER — Ambulatory Visit (HOSPITAL_BASED_OUTPATIENT_CLINIC_OR_DEPARTMENT_OTHER): Payer: BC Managed Care – PPO | Admitting: Adult Health

## 2012-01-21 VITALS — BP 100/69 | HR 87 | Temp 98.6°F | Resp 20 | Ht 62.0 in | Wt 182.2 lb

## 2012-01-21 DIAGNOSIS — C7931 Secondary malignant neoplasm of brain: Secondary | ICD-10-CM

## 2012-01-21 DIAGNOSIS — I82409 Acute embolism and thrombosis of unspecified deep veins of unspecified lower extremity: Secondary | ICD-10-CM

## 2012-01-21 DIAGNOSIS — Z7901 Long term (current) use of anticoagulants: Secondary | ICD-10-CM

## 2012-01-21 DIAGNOSIS — C8307 Small cell B-cell lymphoma, spleen: Secondary | ICD-10-CM

## 2012-01-21 DIAGNOSIS — I2699 Other pulmonary embolism without acute cor pulmonale: Secondary | ICD-10-CM

## 2012-01-21 DIAGNOSIS — Z5181 Encounter for therapeutic drug level monitoring: Secondary | ICD-10-CM

## 2012-01-21 DIAGNOSIS — C801 Malignant (primary) neoplasm, unspecified: Secondary | ICD-10-CM

## 2012-01-21 DIAGNOSIS — D500 Iron deficiency anemia secondary to blood loss (chronic): Secondary | ICD-10-CM

## 2012-01-21 LAB — CBC WITH DIFFERENTIAL/PLATELET
Basophils Absolute: 0.1 10*3/uL (ref 0.0–0.1)
Eosinophils Absolute: 0.4 10*3/uL (ref 0.0–0.5)
HGB: 12.1 g/dL (ref 11.6–15.9)
NEUT#: 16.8 10*3/uL — ABNORMAL HIGH (ref 1.5–6.5)
RBC: 3.79 10*6/uL (ref 3.70–5.45)
RDW: 16 % — ABNORMAL HIGH (ref 11.2–14.5)
WBC: 21.9 10*3/uL — ABNORMAL HIGH (ref 3.9–10.3)
lymph#: 3 10*3/uL (ref 0.9–3.3)

## 2012-01-21 LAB — PROTIME-INR
INR: 2.5 (ref 2.00–3.50)
Protime: 30 Seconds — ABNORMAL HIGH (ref 10.6–13.4)

## 2012-01-21 LAB — COMPREHENSIVE METABOLIC PANEL (CC13)
ALT: 28 U/L (ref 0–55)
Alkaline Phosphatase: 131 U/L (ref 40–150)
CO2: 27 mEq/L (ref 22–29)
Creatinine: 0.6 mg/dL (ref 0.6–1.1)
Total Bilirubin: 0.3 mg/dL (ref 0.20–1.20)

## 2012-01-21 MED ORDER — GADOBENATE DIMEGLUMINE 529 MG/ML IV SOLN
16.0000 mL | Freq: Once | INTRAVENOUS | Status: AC | PRN
  Administered 2012-01-21: 16 mL via INTRAVENOUS

## 2012-01-21 NOTE — Progress Notes (Signed)
OFFICE PROGRESS NOTE  CC  Haley Lowe., MD [address] [place] [address] Dr. Shirlean Kelly Dr. Dorothy Puffer Dr. [name]  DIAGNOSIS: 56 year old female with metastatic adenocarcinoma with brain metastasis Unknown primary but possibly an upper GI source versus lung primary  PRIOR THERAPY:  #1 patient underwent resection on 6/12 of brain met with the pathology showing a adenocarcinoma likely upper GI primary. Patient has also had SRS on 10/06/2011.  #2 patient has had multiple staging studies including PET CT which showed a 7 mm nodule in the right upper lobe which was not seen previously on CT of the chest. She subsequently was seen by Dr. Craige Cotta in pulmonary for possible bronchoscopy/biopsy.  #3 patient also has had consultation with Dr. [name] she has undergone endoscopy colonoscopy without any evidence of a primary. She has undergone capsule endoscopy results of which are pending. Patient also has had MRCP and that was negative.  #4 Patient to begin chemotherapy pending results of the capsule endoscopy. If the capsule endoscopy is negative she will begin chemotherapy consisting of Taxol carboplatinum given every 21 days for a total of 4-6 cycles. First cycle given on 11/11/11  CURRENT THERAPY Cycle 4 day 8 of carbo Taxol.  INTERVAL HISTORY: Haley Lowe 56 y.o. female returns for follow up today.  She received cycle four of her chemotherapy last week and is here for interim labs.  She did have a PET scan which showed resolution of the uptake in the lungs.  She has a MRI today, an appt with Dr. Arbutus Ped (for second opinion) on Monday, and Dr. Ezzard Standing on 10/22.  Her lab work is good, she is feeling well, however she is having some numbness and tingling in her fingertips and toes.  This numbness and tingling does interfere with her ability to pick up pills off the table and put earrings in her ears.  Otherwise she has tolerated her chemotherapy without  difficulty.  MEDICAL HISTORY: Past Medical History  Diagnosis Date  . Hypertension   . PONV (postoperative nausea and vomiting)   . Anxiety   . Depression   . COPD (chronic obstructive pulmonary disease)   . GERD (gastroesophageal reflux disease)   . Headache   . Arthritis   . Adenocarcinoma 09/28/2011  . Hx of adenomatous colonic polyps   . Pulmonary nodules   . Diabetes mellitus   . Emphysema   . 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 Allergy     hips and shoulders  . Cataract     1 lens implaqnt right eye,intact on left eye  . Goiter     nodule watching  . Fibromyalgia     neuropathy tops of both feet  . Metastasis to brain 09/15/11     left frontoparietal region  . History of radiation therapy 10/06/11    1 fraction  15 gray  . Pulmonary emboli 11/04/11    right upper lobe and r lower lobe PE  . Lung nodule seen on imaging study 11/04/11 CT    4mm LLL  . DVT (deep venous thrombosis) 12/09/2011  . Pulmonary embolism 12/09/2011    ALLERGIES:  is allergic to penicillins and codeine.  MEDICATIONS:  Present medications are reviewed and updated  SURGICAL HISTORY:  Past Surgical History  Procedure Date  . Abdominal hysterectomy 1996  . Cholecystectomy 2000  . Shoulder surgery 1998    right  . Eye surgery 2001  vision correction  . Foot surgery 2005    left  . Craniotomy 09/15/2011    Procedure: CRANIOTOMY TUMOR EXCISION;  Surgeon: [name], MD;  Location: [facility];  Service: Neurosurgery;  Laterality: N/A;  Craniotomy resection of tumor with stealth  . Cataract extraction 2011    with lens implant    REVIEW OF SYSTEMS: General: fatigue (-), night sweats (-), fever (-), pain (-) Lymph: palpable nodes (-) HEENT: vision changes (-), mucositis (-), gum bleeding (-), epistaxis (-) Cardiovascular: chest pain (-), palpitations (-) Pulmonary: shortness of breath (-), dyspnea on exertion (-), cough (-), hemoptysis (-) GI:  Early  satiety (-), melena (-), dysphagia (-), nausea/vomiting (-), diarrhea (-) GU: dysuria (-), hematuria (-), incontinence (-) Musculoskeletal: joint swelling (-), joint pain (-), back pain (-) Neuro: weakness (-), numbness (-), headache (-), confusion (-) Skin: Rash (-), lesions (-), dryness (-) Psych: depression (-), suicidal/homicidal ideation (-), feeling of hopelessness (-)  PHYSICAL EXAMINATION: BP 100/69  Pulse 87  Temp 98.6 F (37 C) (Oral)  Resp 20  Ht 5\' 2"  (1.575 m)  Wt 182 lb 3.2 oz (82.645 kg)  BMI 33.32 kg/m2 General: Patient is a well appearing female in no acute distress HEENT: PERRLA, sclerae anicteric no conjunctival pallor, MMM Neck: supple, no palpable adenopathy Lungs: clear to auscultation bilaterally, no wheezes, rhonchi, or rales Cardiovascular: regular rate rhythm, S1, S2, no murmurs, rubs or gallops Abdomen: Soft, non-tender, non-distended, normoactive bowel sounds, no HSM Extremities: warm and well perfused, no clubbing, cyanosis, or edema Skin: No rashes or lesions Neuro: Non-focal, patient is unable to pick up coin sitting on table unless uses fingernails for assistance ECOG PERFORMANCE STATUS: 1 - Symptomatic but completely ambulatory  LABORATORY DATA: Lab Results  Component Value Date   WBC 21.9* 01/21/2012   HGB 12.1 01/21/2012   HCT 36.3 01/21/2012   MCV 95.6 01/21/2012   PLT 169 01/21/2012      Chemistry      Component Value Date/Time   NA 138 01/13/2012 0947   NA 138 11/18/2011 1153   K 3.6 01/13/2012 0947   K 3.6 11/18/2011 1153   CL 102 01/13/2012 0947   CL 100 11/18/2011 1153   CO2 24 01/13/2012 0947   CO2 27 11/18/2011 1153   BUN 9.0 01/13/2012 0947   BUN 14 11/18/2011 1153   CREATININE 0.6 01/13/2012 0947   CREATININE 0.65 11/18/2011 1153      Component Value Date/Time   CALCIUM 9.3 01/13/2012 0947   CALCIUM 8.8 11/18/2011 1153   ALKPHOS 92 01/13/2012 0947   ALKPHOS 112 11/18/2011 1153   AST 20 01/13/2012 0947   AST 13 11/18/2011  1153   ALT 32 01/13/2012 0947   ALT 18 11/18/2011 1153   BILITOT 0.40 01/13/2012 0947   BILITOT 0.3 11/18/2011 1153     RADIOGRAPHIC STUDIES:  ASSESSMENT: 56 year old female with  #1 metastatic adenocarcinoma of unknown primary with brain metastasis. Patient is now status post  SRS. Post procedure she is doing quite well.  #2 we have performed extensive studies to try to elucidate the primary for at the adenocarcinoma unfortunately up-to-date all scans mammograms and procedures have been negative. Patient's PET scan did show a 7 millimeter nodule that had hypermetabolic activity concerning for a lung primary in this smoker.Further evaluation will be performed by pulmonary.  #3 Patient is pulmonary embolism as well as left lower extremity DVT. She had a Greenfield filter placed just recently. Patient's INR is therapeutic and I  have recommended that she discontinue the Lovenox and continue the Coumadin at 5 mg daily. She will have her INRs checked at Dr. Sharyon Medicus office and have the results faxed to [place].  #4 patient  Is very and extremely anxious about her diagnosis she still thinks that she is curable however she and I. And her daughter had a very extensive discussion right from the beginning that this is not a curable disease and that we will continue to treat her as long as she is able to tolerate the chemotherapy. We certainly do plan on doing restaging studies and this will calm after her third cycle of the chemotherapy. I did explain to the patient that we are not sure exactly what we are treating we could possibly be treating her lung cancer. Certainly her PET scan did have increased FDG uptake in the right lung. And in spite of my asking the pulmonary medicine folks to possibly do a biopsy did do think that the biopsy was not feasible. She did have a response visible by the last PET scan.    PLAN:  #1 Mrs. Fait has tolerated cycle 4 of her chemotherapy very well.  She didn't have any ill  effects such as nausea, vomiting, etc.  I am concerned about the numbness on her fingertips and toes, and how it may be interfering with her motor function.  She will see Dr. Welton Flakes on 11/1 and we will re-evaluate then.  Her INR is doing well, she will continue her current dose of Warfarin 6mg  daily.  #2 she will return in 2 weeks' time for evaluation before cycle 5 of chemo.  All questions were answered. The patient knows to call the clinic with any problems, questions or concerns. We can certainly see the patient much sooner if necessary.  I spent 15 minutes counseling the patient face to face. The total time spent in the appointment was 30 minutes.  This case was reviewed with Dr. Rutherford Limerick C. Lyn Hollingshead, NP Medical Oncology [facility] Phone: [phone]

## 2012-01-21 NOTE — Patient Instructions (Addendum)
Doing well.  Lab work looks good.  We will see you back on 11/1 for your next treatment.  Please call us if you have any questions or concerns.

## 2012-01-23 NOTE — Progress Notes (Signed)
OFFICE PROGRESS NOTE  CC  Haley Lowe., MD [address] [place] [address] Dr. Shirlean Kelly Dr. Dorothy Puffer Dr. [name]  DIAGNOSIS: 56 year old female with metastatic adenocarcinoma with brain metastasis Unknown primary but possibly an upper GI source versus lung primary  PRIOR THERAPY:  #1 patient underwent resection on 6/12 of brain met with the pathology showing a adenocarcinoma likely upper GI primary. Patient has also had SRS on 10/06/2011.  #2 patient has had multiple staging studies including PET CT which showed a 7 mm nodule in the right upper lobe which was not seen previously on CT of the chest. She subsequently was seen by Dr. Craige Cotta in pulmonary for possible bronchoscopy/biopsy.  #3 patient also has had consultation with Dr. [name] she has undergone endoscopy colonoscopy without any evidence of a primary. She has undergone capsule endoscopy results of which are pending. Patient also has had MRCP and that was negative.  #4 Patient to begin chemotherapy pending results of the capsule endoscopy. If the capsule endoscopy is negative she will begin chemotherapy consisting of Taxol carboplatinum given every 21 days for a total of 4-6 cycles. First cycle given on 11/11/11  #5She has had PET/CT scan And results are as below.  CURRENT THERAPY: cycle #4 of carbo Taxol.  INTERVAL HISTORY: CASELYN BUTT 56 y.o. female returns for Followup visit today. Clinically she seems to be doing well she is here for cycle #4 of Taxol and carboplatinum. We did discuss scan results today and her scans are significantly improved. She overall feels well. She is without any other complaints. Her cough is significantly improved.  MEDICAL HISTORY: Past Medical History  Diagnosis Date  . Hypertension   . PONV (postoperative nausea and vomiting)   . Anxiety   . Depression   . COPD (chronic obstructive pulmonary disease)   . GERD (gastroesophageal reflux disease)    . Headache   . Arthritis   . Adenocarcinoma 09/28/2011  . Hx of adenomatous colonic polyps   . Pulmonary nodules   . Diabetes mellitus   . Emphysema   . 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 Allergy     hips and shoulders  . Cataract     1 lens implaqnt right eye,intact on left eye  . Goiter     nodule watching  . Fibromyalgia     neuropathy tops of both feet  . Metastasis to brain 09/15/11     left frontoparietal region  . History of radiation therapy 10/06/11    1 fraction  15 gray  . Pulmonary emboli 11/04/11    right upper lobe and r lower lobe PE  . Lung nodule seen on imaging study 11/04/11 CT    4mm LLL  . DVT (deep venous thrombosis) 12/09/2011  . Pulmonary embolism 12/09/2011    ALLERGIES:  is allergic to penicillins and codeine.  MEDICATIONS:  Present medications are reviewed and updated  SURGICAL HISTORY:  Past Surgical History  Procedure Date  . Abdominal hysterectomy 1996  . Cholecystectomy 2000  . Shoulder surgery 1998    right  . Eye surgery 2001    vision correction  . Foot surgery 2005    left  . Craniotomy 09/15/2011    Procedure: CRANIOTOMY TUMOR EXCISION;  Surgeon: [name], MD;  Location: [facility];  Service: Neurosurgery;  Laterality: N/A;  Craniotomy resection of tumor with stealth  . Cataract extraction 2011  with lens implant    ECOG PERFORMANCE STATUS: 1 - Symptomatic but completely ambulatory HEENT exam EOMI PERRLA sclerae anicteric no conjunctival pallor oral mucosa is moist neck is supple lungs are clear bilaterally to auscultation cardiovascular is regular rate rhythm abdomen is soft nontender nondistended bowel sounds are present no HSM extremities no edema neuro patient's alert oriented otherwise nonfocal. Blood pressure 112/76, pulse 83, temperature 98.8 F (37.1 C), temperature source Oral, resp. rate 20, height 5\' 2"  (1.575 m), weight 184 lb 6.4 oz (83.643 kg).  LABORATORY DATA: Lab Results    Component Value Date   WBC 21.9* 01/21/2012   HGB 12.1 01/21/2012   HCT 36.3 01/21/2012   MCV 95.6 01/21/2012   PLT 169 01/21/2012      Chemistry      Component Value Date/Time   NA 138 01/21/2012 1422   NA 138 11/18/2011 1153   K 3.8 01/21/2012 1422   K 3.6 11/18/2011 1153   CL 99 01/21/2012 1422   CL 100 11/18/2011 1153   CO2 27 01/21/2012 1422   CO2 27 11/18/2011 1153   BUN 13.0 01/21/2012 1422   BUN 14 11/18/2011 1153   CREATININE 0.6 01/21/2012 1422   CREATININE 0.65 11/18/2011 1153      Component Value Date/Time   CALCIUM 10.1 01/21/2012 1422   CALCIUM 8.8 11/18/2011 1153   ALKPHOS 131 01/21/2012 1422   ALKPHOS 112 11/18/2011 1153   AST 14 01/21/2012 1422   AST 13 11/18/2011 1153   ALT 28 01/21/2012 1422   ALT 18 11/18/2011 1153   BILITOT 0.30 01/21/2012 1422   BILITOT 0.3 11/18/2011 1153     RADIOGRAPHIC STUDIES: Comparison: CT chest dated 11/04/2011. MR abdomen dated  10/15/2011. PET CT dated 09/30/2011.  Findings:  Neck: No hypermetabolic lymph nodes in the neck. CT is notable for  prior left frontal craniotomy.  Chest: No hypermetabolic mediastinal or hilar nodes.  Prior hypermetabolic medial left upper lobe pulmonary nodule is no  longer visualized.  Stable patchy/nodular opacity in the anterior right upper lobe  (series 2/image 103). Stable 4 mm medial left upper lobe nodule  (series 2/image 114). Tiny bilateral lower lobe nodules (series  2/images 121 and 127), better evaluated on prior CT. Underlying  moderate centrilobular emphysematous changes.  Abdomen/Pelvis: No abnormal hypermetabolic activity within the  liver, pancreas, adrenal glands, or spleen. Surgically, the liver  is within normal limits.  No hypermetabolic lymph nodes in the abdomen or pelvis.  Skeleton: Mildly heterogeneous osseous uptake without focal  hypermetabolic activity to suggest skeletal metastasis.  IMPRESSION:  Prior hypermetabolic medial left upper lobe pulmonary nodule is no   longer visualized.  Additional small pulmonary nodules/opacities, better evaluated on  prior CT chest, unchanged.  No evidence of hypermetabolic primary or metastatic neoplasm in the  neck, chest, abdomen, or pelvis.   ASSESSMENT: 56 year old female with  #1 metastatic adenocarcinoma of unknown primary with brain metastasis. Patient is now status post  SRS. Post procedure she is doing quite well.  #2 we have performed extensive studies to try to elucidate the primary for at the adenocarcinoma unfortunately up-to-date all scans mammograms and procedures have been negative. Patient's PET scan did show a 7 millimeter nodule that had hypermetabolic activity concerning for a lung primary in this smoker.Further evaluation will be performed by pulmonary.  #3 Patient is pulmonary embolism as well as left lower extremity DVT. She had a [place] filter placed just recently. Patient's INR is therapeutic and I have recommended that she  discontinue the Lovenox and continue the Coumadin at 5 mg daily. She will have her INRs checked at Dr. Sharyon Medicus office and have the results faxed to [place].  PLAN:  #1 patient will proceed with cycle #4 of Taxol and carboplatinum.  #2 she will be referred to Dr. Arlis Porta 4 second opinion or even transfer of care.  All questions were answered. The patient knows to call the clinic with any problems, questions or concerns. We can certainly see the patient much sooner if necessary.  I spent 25 minutes counseling the patient face to face. The total time spent in the appointment was 30 minutes.    Drue Second, MD Medical/Oncology [facility] [phone] (beeper) [phone] (Office)

## 2012-01-24 ENCOUNTER — Ambulatory Visit (HOSPITAL_BASED_OUTPATIENT_CLINIC_OR_DEPARTMENT_OTHER): Payer: BC Managed Care – PPO | Admitting: Internal Medicine

## 2012-01-24 ENCOUNTER — Ambulatory Visit: Payer: BC Managed Care – PPO

## 2012-01-24 ENCOUNTER — Other Ambulatory Visit (HOSPITAL_BASED_OUTPATIENT_CLINIC_OR_DEPARTMENT_OTHER): Payer: BC Managed Care – PPO | Admitting: Lab

## 2012-01-24 ENCOUNTER — Encounter: Payer: Self-pay | Admitting: Internal Medicine

## 2012-01-24 VITALS — BP 92/64 | HR 69 | Temp 99.2°F | Resp 20 | Ht 62.0 in | Wt 185.0 lb

## 2012-01-24 DIAGNOSIS — C7931 Secondary malignant neoplasm of brain: Secondary | ICD-10-CM

## 2012-01-24 DIAGNOSIS — R970 Elevated carcinoembryonic antigen [CEA]: Secondary | ICD-10-CM

## 2012-01-24 DIAGNOSIS — C7949 Secondary malignant neoplasm of other parts of nervous system: Secondary | ICD-10-CM

## 2012-01-24 DIAGNOSIS — C801 Malignant (primary) neoplasm, unspecified: Secondary | ICD-10-CM

## 2012-01-24 LAB — COMPREHENSIVE METABOLIC PANEL (CC13)
BUN: 14 mg/dL (ref 7.0–26.0)
CO2: 26 mEq/L (ref 22–29)
Creatinine: 0.7 mg/dL (ref 0.6–1.1)
Glucose: 129 mg/dl — ABNORMAL HIGH (ref 70–99)
Total Bilirubin: 0.2 mg/dL (ref 0.20–1.20)

## 2012-01-24 LAB — CBC WITH DIFFERENTIAL/PLATELET
Eosinophils Absolute: 0.1 10*3/uL (ref 0.0–0.5)
HCT: 35.8 % (ref 34.8–46.6)
LYMPH%: 11.1 % — ABNORMAL LOW (ref 14.0–49.7)
MCHC: 33.3 g/dL (ref 31.5–36.0)
MCV: 96.8 fL (ref 79.5–101.0)
MONO#: 1.1 10*3/uL — ABNORMAL HIGH (ref 0.1–0.9)
NEUT#: 14.7 10*3/uL — ABNORMAL HIGH (ref 1.5–6.5)
NEUT%: 81.7 % — ABNORMAL HIGH (ref 38.4–76.8)
Platelets: 173 10*3/uL (ref 145–400)
WBC: 18 10*3/uL — ABNORMAL HIGH (ref 3.9–10.3)

## 2012-01-24 LAB — LACTATE DEHYDROGENASE (CC13): LDH: 224 U/L — ABNORMAL HIGH (ref 125–220)

## 2012-01-24 NOTE — Progress Notes (Signed)
[facility] Telephone:([phone]) [phone]   [phone]  OFFICE PROGRESS NOTE  [name], MD [address] [place] [address]  DIAGNOSIS: Metastatic adenocarcinoma of unknown primary questionable for primary lung cancer with solitary brain metastasis.  PRIOR THERAPY: 1) left frontoparietal craniotomy with intraoperative Stealth frameless stereotaxis and resection of left posterior frontal brain tumor on 09/15/2011. 2) status post stereotactic radiotherapy for a total dose of 15 GY in one fraction to the postoperative cavity in the left frontoparietal region under the care of Dr. Mitzi Hansen on 10/04/2011  CURRENT THERAPY: Systemic chemotherapy with carboplatin for AUC of 5 and paclitaxel 175 mg/M2 every 3 weeks with Neulasta support. The patient is status post 4 cycles.  INTERVAL HISTORY: Haley Lowe 56 y.o. female returns to the clinic today for second opinion visit accompanied by her husband. She is a patient of Dr. Welton Flakes. The patient mentions that in early June of 2013, she had difficulty writing her name and brushing her teeth. She was seen at the emergency Department at [facility] and MRI of the brain was performed with and without contrast and it showed 2.1 x 2.1 x 2.2 CM a ring-enhancing lesion with peripheral nodularity and market amount of surrounding vasogenic edema suspicious for malignancy. CT of the chest, abdomen and pelvis performed on 09/09/2011 showed tiny bilateral pulmonary nodules up to 4 mm in diameter. These were nonspecific but unable to exclude metastatic disease. There was no dominant pulmonary mass identified. There was also questionable wall thickening of the distal thoracic esophagus. On 09/15/2011 the patient underwent left frontoparietal craniotomy with resection of the left posterior frontal brain tumor under the care of Dr. Newell Coral. The final pathology showed metastatic adenocarcinoma. The adenocarcinoma  demonstrates the following immunophenotype: Cytokeratin 7 - strong diffuse expression. Cytokeratin AE1/AE3 - strong diffuse expression. Cytokeratin 20 - negative expression. CDX-2 - negative expression. TTF-1 - negative expression. Napsin A - negative expression. Estrogen receptor - negative expression. GFAP - negative expression. GCDFP - negative expression. Mucicarmine - patchy strong expression. Although by morphology and mucicarmine stain expression, the tumor is diagnostic of metastatic adenocarcinoma, the immunophenotype is nonspecific. By immunophenotype, a primary upper gastrointestinal, pancreaticobiliary and, least likely lung are the differential considerations. Serum tumor marker showed normal CA 125, CA 27.29 and CEA but elevated CA 19.9 up to 68.7. The patient was seen by Dr. Juanda Chance and she underwent EGD as well as colonoscopy which were unremarkable for any malignancy. She also underwent capsule endoscopy that was unremarkable for any malignancy of the small intestine. The patient also underwent stereotactic radiotherapy to the resection cavity under the care of Dr. Mitzi Hansen. She was started on systemic chemotherapy with carboplatin for AUC of 5 and paclitaxel and 75 mg/M2 every 3 weeks with Neulasta support. She status post 4 cycles and tolerating her treatment fairly well except for mild peripheral neuropathy, alopecia and fatigue. She denied having any significant nausea or vomiting. She has no significant weight loss or night sweats. Repeat PET scan recently showed no evidence for disease progression but some improvement in the tiny pulmonary nodules. MRI of the brain also showed significant improvement in her metastatic brain lesion. Dr. Welton Flakes kindly referred the patient to me today for second opinion regarding her condition. The patient is feeling fine today except for the fatigue and some anxiety addition to the mild peripheral neuropathy and blisters in her mouth.  MEDICAL HISTORY: Past  Medical History  Diagnosis Date  .  Hypertension   . PONV (postoperative nausea and vomiting)   . Anxiety   . Depression   . COPD (chronic obstructive pulmonary disease)   . GERD (gastroesophageal reflux disease)   . Headache   . Arthritis   . Adenocarcinoma 09/28/2011  . Hx of adenomatous colonic polyps   . Pulmonary nodules   . Diabetes mellitus   . Emphysema   . 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 Allergy     hips and shoulders  . Cataract     1 lens implaqnt right eye,intact on left eye  . Goiter     nodule watching  . Fibromyalgia     neuropathy tops of both feet  . Metastasis to brain 09/15/11     left frontoparietal region  . History of radiation therapy 10/06/11    1 fraction  15 gray  . Pulmonary emboli 11/04/11    right upper lobe and r lower lobe PE  . Lung nodule seen on imaging study 11/04/11 CT    4mm LLL  . DVT (deep venous thrombosis) 12/09/2011  . Pulmonary embolism 12/09/2011    ALLERGIES:  is allergic to penicillins and codeine.  MEDICATIONS:  Current Outpatient Prescriptions  Medication Sig Dispense Refill  . acyclovir ointment (ZOVIRAX) 5 % Apply 1 application topically every 3 (three) hours. Apply to face      . albuterol (PROVENTIL) (2.5 MG/3ML) 0.083% nebulizer solution Take 2.5 mg by nebulization every 6 (six) hours as needed. For shortness of breath      . CARAFATE 1 GM/10ML suspension Take 1 g by mouth 2 (two) times daily.       . citalopram (CELEXA) 20 MG tablet Take 20 mg by mouth every morning.      Marland Kitchen CLODERM 0.1 % cream Apply 1 application topically as needed. For scalp      . dexamethasone (DECADRON) 4 MG tablet Take 8 mg by mouth 2 (two) times daily. Take 2 tablets by mouth two times a day starting the day after chemotherapy for 3 days.      . divalproex (DEPAKOTE) 500 MG DR tablet Take 500 mg by mouth 2 (two) times daily.       Marland Kitchen docusate sodium (COLACE) 100 MG capsule Take 100 mg by mouth as needed.      Marland Kitchen  esomeprazole (NEXIUM) 40 MG capsule Take 40 mg by mouth 2 (two) times daily.       Marland Kitchen lidocaine-prilocaine (EMLA) cream Apply topically as needed.  30 g  6  . LORazepam (ATIVAN) 0.5 MG tablet Take 1 tablet (0.5 mg total) by mouth every 6 (six) hours as needed (Nausea or vomiting).  30 tablet  0  . metoprolol tartrate (LOPRESSOR) 25 MG tablet Take 25 mg by mouth 2 (two) times daily.      Marland Kitchen nystatin (MYCOSTATIN) 100000 UNIT/ML suspension Take 500,000 Units by mouth 4 (four) times daily as needed. For thrush      . olmesartan-hydrochlorothiazide (BENICAR HCT) 20-12.5 MG per tablet Take 0.25 tablets by mouth every morning. 1/4 tablet      . ondansetron (ZOFRAN) 8 MG tablet Take 1 tab two times a day starting the day after chemo for 3 days. Then take 1 tab two times a day as needed for nausea or vomiting.  30 tablet  1  . polyethylene glycol (MIRALAX / GLYCOLAX) packet Take 17 g by mouth daily. For constipation      . prochlorperazine (COMPAZINE)  10 MG tablet Take 1 tablet (10 mg total) by mouth every 6 (six) hours as needed (Nausea or vomiting).  30 tablet  1  . prochlorperazine (COMPAZINE) 25 MG suppository Place 1 suppository (25 mg total) rectally every 12 (twelve) hours as needed for nausea.  12 suppository  3  . pseudoephedrine-guaifenesin (MUCINEX D) 60-600 MG per tablet Take 1 tablet by mouth every 12 (twelve) hours.      . Simethicone (GAS-X PO) Take 1 tablet by mouth as needed. 1-2 prn      . simvastatin (ZOCOR) 10 MG tablet Take 10 mg by mouth at bedtime.      Marland Kitchen warfarin (COUMADIN) 5 MG tablet Take 1 tablet (5 mg total) by mouth daily. Take 1 tablet (5 mg) by daily or as directed by MD  30 tablet  2    SURGICAL HISTORY:  Past Surgical History  Procedure Date  . Abdominal hysterectomy 1996  . Cholecystectomy 2000  . Shoulder surgery 1998    right  . Eye surgery 2001    vision correction  . Foot surgery 2005    left  . Craniotomy 09/15/2011    Procedure: CRANIOTOMY TUMOR EXCISION;   Surgeon: [name], MD;  Location: [facility];  Service: Neurosurgery;  Laterality: N/A;  Craniotomy resection of tumor with stealth  . Cataract extraction 2011    with lens implant    REVIEW OF SYSTEMS:  A comprehensive review of systems was negative except for: Constitutional: positive for fatigue Neurological: positive for paresthesia   PHYSICAL EXAMINATION: General appearance: alert, cooperative and no distress Head: Normocephalic, without obvious abnormality, atraumatic Neck: no adenopathy Lymph nodes: Cervical, supraclavicular, and axillary nodes normal. Resp: clear to auscultation bilaterally Back: symmetric, no curvature. ROM normal. No CVA tenderness. Cardio: regular rate and rhythm, S1, S2 normal, no murmur, click, rub or gallop GI: soft, non-tender; bowel sounds normal; no masses,  no organomegaly Extremities: extremities normal, atraumatic, no cyanosis or edema Neurologic: Alert and oriented X 3, normal strength and tone. Normal symmetric reflexes. Normal coordination and gait  ECOG PERFORMANCE STATUS: 1 - Symptomatic but completely ambulatory  Blood pressure 92/64, pulse 69, temperature 99.2 F (37.3 C), temperature source Oral, resp. rate 20, height 5\' 2"  (1.575 m), weight 185 lb (83.915 kg).  LABORATORY DATA: Lab Results  Component Value Date   WBC 18.0* 01/24/2012   HGB 11.9 01/24/2012   HCT 35.8 01/24/2012   MCV 96.8 01/24/2012   PLT 173 01/24/2012      Chemistry      Component Value Date/Time   NA 138 02-07-2012 1422   NA 138 11/18/2011 1153   K 3.8 Feb 07, 2012 1422   K 3.6 11/18/2011 1153   CL 99 02-07-12 1422   CL 100 11/18/2011 1153   CO2 27 02-07-2012 1422   CO2 27 11/18/2011 1153   BUN 13.0 07-Feb-2012 1422   BUN 14 11/18/2011 1153   CREATININE 0.6 February 07, 2012 1422   CREATININE 0.65 11/18/2011 1153      Component Value Date/Time   CALCIUM 10.1 02-07-2012 1422   CALCIUM 8.8 11/18/2011 1153   ALKPHOS 131 02-07-12 1422   ALKPHOS 112  11/18/2011 1153   AST 14 2012-02-07 1422   AST 13 11/18/2011 1153   ALT 28 2012/02/07 1422   ALT 18 11/18/2011 1153   BILITOT 0.30 Feb 07, 2012 1422   BILITOT 0.3 11/18/2011 1153       RADIOGRAPHIC STUDIES: Mr Laqueta Jean ZO Contrast  07-Feb-2012  *RADIOLOGY REPORT*  Clinical Data: Metastatic adenocarcinoma of unknown primary with intracranial metastatic lesion treated with surgery and radiation therapy.  Follow-up.  MRI HEAD WITHOUT AND WITH CONTRAST  Technique:  Multiplanar, multiecho pulse sequences of the brain and surrounding structures were obtained according to standard protocol without and with intravenous contrast  Contrast: 16mL MULTIHANCE GADOBENATE DIMEGLUMINE 529 MG/ML IV SOLN  Comparison: Most recent examination 10/01/2011.  Findings: Post contrast images are motion degraded.  Postsurgical changes posterior left frontal lobe with interval decrease size of  hemorrhage /proteinaceous material at the resection cavity site.   Minimal nodularity along the periphery which demonstrates at most minimal enhancement.  No new lesion noted.  No acute infarct.  No new intracranial hemorrhage noted.  Partially empty sella once again noted.  Small right vertebral artery may end in a PICA distribution and is unchanged.  Mild exophthalmos.  Mild atrophy without hydrocephalus.  IMPRESSION: Interval improvement postsurgical changes left frontal lobe without new lesion identified.  Please see above.   Original Report Authenticated By: Fuller Canada, M.D.    US Soft Tissue Head/neck  01/18/2012  *RADIOLOGY REPORT*  Clinical Data: Nontoxic multinodular goiter, family history thyroid cancer  THYROID ULTRASOUND  Technique: Ultrasound examination of the thyroid gland and adjacent soft tissues was performed.  Comparison:  01/21/2011  Findings:  Right thyroid lobe:  4.3 x 2.1 x 2.2 cm.  Mildly inhomogeneous echogenicity. Left thyroid lobe:  4.5 x 1.7 x 1.5 cm.  Minimally inhomogeneous echogenicity. Isthmus:  3 mm thick   Focal nodules:  Tiny nodules superior pole left lobe 3 x 3 x 3 mm, likely cystic. Tiny hypoechoic nodule mid right lobe 4 x 2 x 3 mm. Additional tiny bilateral thyroid nodules bilaterally. No dominant mass, calcification, or large cyst.  Lymphadenopathy:  None visualized.  IMPRESSION: Tiny bilateral nonspecific thyroid nodules. Findings do not meet current SRU consensus criteria for biopsy. Follow-up by clinical exam is recommended.  If patient has known risk factors for thyroid carcinoma, consider follow-up ultrasound in 12 months.  If patient is clinically hyperthyroid, consider nuclear medicine thyroid uptake and scan.   Original Report Authenticated By: Lollie Marrow, M.D.    Nm Pet Image Restag (ps) Skull Base To Thigh  01/06/2012  *RADIOLOGY REPORT*  Clinical Data: Subsequent treatment strategy for metastatic lung cancer. Status post chemotherapy.  NUCLEAR MEDICINE PET SKULL BASE TO THIGH  Fasting Blood Glucose:  92  Technique:  15.7 mCi F-18 FDG was injected intravenously. CT data was obtained and used for attenuation correction and anatomic localization only.  (This was not acquired as a diagnostic CT examination.) Additional exam technical data entered on technologist worksheet.  Comparison:  CT chest dated 11/04/2011.  MR abdomen dated 10/15/2011.  PET CT dated 09/30/2011.  Findings:  Neck: No hypermetabolic lymph nodes in the neck.  CT is notable for prior left frontal craniotomy.  Chest:  No hypermetabolic mediastinal or hilar nodes.  Prior hypermetabolic medial left upper lobe pulmonary nodule is no longer visualized.  Stable patchy/nodular opacity in the anterior right upper lobe (series 2/image 103). Stable 4 mm medial left upper lobe nodule (series 2/image 114).  Tiny bilateral lower lobe nodules (series 2/images 121 and 127), better evaluated on prior CT.  Underlying moderate centrilobular emphysematous changes.  Abdomen/Pelvis:  No abnormal hypermetabolic activity within the liver, pancreas,  adrenal glands, or spleen.  Surgically, the liver is within normal limits.  No hypermetabolic lymph nodes in the abdomen or pelvis.  Skeleton:  Mildly heterogeneous osseous uptake  without focal hypermetabolic activity to suggest skeletal metastasis.  IMPRESSION: Prior hypermetabolic medial left upper lobe pulmonary nodule is no longer visualized.  Additional small pulmonary nodules/opacities, better evaluated on prior CT chest, unchanged.  No evidence of hypermetabolic primary or metastatic neoplasm in the neck, chest, abdomen, or pelvis.   Original Report Authenticated By: Charline Bills, M.D.     ASSESSMENT: This is a very pleasant 56 years old white female recently diagnosed with metastatic adenocarcinoma of unknown primary questionable for primary lung cancer versus upper GI tract including pancreatic adenocarcinoma especially with the elevated CA 19.9 but no anatomic correlation to confirm such a diagnosis. The patient is currently undergoing systemic chemotherapy with carboplatin and paclitaxel status post 4 cycles and she is tolerating her treatment fairly well.  PLAN: I have a lengthy discussion with the patient and her husband today about her current disease status, prognosis and treatment options. I recommended for the patient the following; #1 sending her tissue block if there is sufficient tissue to be tested for EGFR mutation and the ALK gene translocation. #2 continue current systemic chemotherapy of carboplatin and paclitaxel for a total of 6 cycles. #3 if the patient has no evidence for disease progression by the end of cycle #6 she may be considered for maintenance treatment with either single agent Tarceva 150 mg by mouth daily or Alimta at 500 mg/M2 every 3 weeks. #4 she would need close monitoring for the peripheral neuropathy and adjustment of the paclitaxel dose if needed. #5 the patient will continue her routine followup visit and treatment under the care of Dr. Welton Flakes and that'll be  happy to assist in her care in the future if needed. I gave the patient and her husband the time to ask questions and I answered them completely to their satisfaction. Thank you so much for allowing me to participate in the care of Ms. Haley Lowe.  All questions were answered. The patient knows to call the clinic with any problems, questions or concerns. We can certainly see the patient much sooner if necessary.  I spent 30 minutes counseling the patient face to face. The total time spent in the appointment was 50 minutes.

## 2012-01-24 NOTE — Progress Notes (Signed)
Checked in new patient. No financial issues. °

## 2012-01-24 NOTE — Patient Instructions (Signed)
Continue current chemotherapy under the care of Dr. Welton Flakes.

## 2012-01-26 ENCOUNTER — Other Ambulatory Visit: Payer: Self-pay | Admitting: *Deleted

## 2012-01-26 ENCOUNTER — Telehealth: Payer: Self-pay | Admitting: *Deleted

## 2012-01-26 DIAGNOSIS — C801 Malignant (primary) neoplasm, unspecified: Secondary | ICD-10-CM

## 2012-01-26 DIAGNOSIS — C7931 Secondary malignant neoplasm of brain: Secondary | ICD-10-CM

## 2012-01-26 MED ORDER — ONDANSETRON HCL 8 MG PO TABS: ORAL_TABLET | ORAL | Status: DC

## 2012-01-26 MED ORDER — PROCHLORPERAZINE MALEATE 10 MG PO TABS: 10.0000 mg | ORAL_TABLET | Freq: Four times a day (QID) | ORAL | Status: DC | PRN

## 2012-01-26 NOTE — Telephone Encounter (Signed)
Pt's daughter, Roanna Raider called states " My mom went to Dr. Arbutus Ped for a 2nd opinion and he said the Carbo/Taxol dose should be decreased and I just want to make sure he spoke with Dr. Welton Flakes and that the orders have been decreased and everything is in place for Monday when she comes in for chemo. Also inquired if pt can have flu shot on Monday. Discussed with Sherri Dr. Arbutus Ped did speak with MD regarding the visit, I will also pass her concerns to MD and her request for a flu shot. No further questions at this time.

## 2012-01-26 NOTE — Telephone Encounter (Signed)
Spoke to daughter regarding her concerns. All issues and questions addressed

## 2012-01-31 ENCOUNTER — Encounter: Payer: Self-pay | Admitting: Oncology

## 2012-01-31 NOTE — Progress Notes (Signed)
[name] Helyn App left message. [phone] Home  [phone] Cell. Have questions about possible financial aid for her mom. She is on file as contact. I called and left message for her on her cell  [phone].

## 2012-02-03 ENCOUNTER — Other Ambulatory Visit: Payer: Self-pay | Admitting: Radiation Therapy

## 2012-02-03 DIAGNOSIS — C7931 Secondary malignant neoplasm of brain: Secondary | ICD-10-CM

## 2012-02-04 ENCOUNTER — Telehealth: Payer: Self-pay | Admitting: *Deleted

## 2012-02-04 ENCOUNTER — Ambulatory Visit (HOSPITAL_BASED_OUTPATIENT_CLINIC_OR_DEPARTMENT_OTHER): Payer: BC Managed Care – PPO | Admitting: Oncology

## 2012-02-04 ENCOUNTER — Encounter: Payer: Self-pay | Admitting: Oncology

## 2012-02-04 ENCOUNTER — Other Ambulatory Visit (HOSPITAL_BASED_OUTPATIENT_CLINIC_OR_DEPARTMENT_OTHER): Payer: BC Managed Care – PPO | Admitting: Lab

## 2012-02-04 ENCOUNTER — Ambulatory Visit (HOSPITAL_BASED_OUTPATIENT_CLINIC_OR_DEPARTMENT_OTHER): Payer: BC Managed Care – PPO

## 2012-02-04 VITALS — BP 144/83 | HR 83 | Temp 98.4°F | Resp 20 | Ht 62.0 in | Wt 186.1 lb

## 2012-02-04 DIAGNOSIS — C801 Malignant (primary) neoplasm, unspecified: Secondary | ICD-10-CM

## 2012-02-04 DIAGNOSIS — I2699 Other pulmonary embolism without acute cor pulmonale: Secondary | ICD-10-CM

## 2012-02-04 DIAGNOSIS — C7951 Secondary malignant neoplasm of bone: Secondary | ICD-10-CM

## 2012-02-04 DIAGNOSIS — I82409 Acute embolism and thrombosis of unspecified deep veins of unspecified lower extremity: Secondary | ICD-10-CM

## 2012-02-04 DIAGNOSIS — Z5111 Encounter for antineoplastic chemotherapy: Secondary | ICD-10-CM

## 2012-02-04 DIAGNOSIS — F411 Generalized anxiety disorder: Secondary | ICD-10-CM

## 2012-02-04 DIAGNOSIS — C7931 Secondary malignant neoplasm of brain: Secondary | ICD-10-CM

## 2012-02-04 DIAGNOSIS — C7949 Secondary malignant neoplasm of other parts of nervous system: Secondary | ICD-10-CM

## 2012-02-04 LAB — COMPREHENSIVE METABOLIC PANEL (CC13)
ALT: 45 U/L (ref 0–55)
AST: 22 U/L (ref 5–34)
Albumin: 3.8 g/dL (ref 3.5–5.0)
Alkaline Phosphatase: 94 U/L (ref 40–150)
BUN: 12 mg/dL (ref 7.0–26.0)
CO2: 27 meq/L (ref 22–29)
Calcium: 9.6 mg/dL (ref 8.4–10.4)
Chloride: 103 meq/L (ref 98–107)
Creatinine: 0.6 mg/dL (ref 0.6–1.1)
Glucose: 114 mg/dL — ABNORMAL HIGH (ref 70–99)
Potassium: 3.7 meq/L (ref 3.5–5.1)
Sodium: 140 meq/L (ref 136–145)
Total Bilirubin: 0.25 mg/dL (ref 0.20–1.20)
Total Protein: 6.8 g/dL (ref 6.4–8.3)

## 2012-02-04 LAB — PROTIME-INR
INR: 2 (ref 2.00–3.50)
Protime: 24 Seconds — ABNORMAL HIGH (ref 10.6–13.4)

## 2012-02-04 LAB — CBC WITH DIFFERENTIAL/PLATELET
BASO%: 1 % (ref 0.0–2.0)
Basophils Absolute: 0.1 10e3/uL (ref 0.0–0.1)
EOS%: 1 % (ref 0.0–7.0)
Eosinophils Absolute: 0.1 10e3/uL (ref 0.0–0.5)
HCT: 34.4 % — ABNORMAL LOW (ref 34.8–46.6)
HGB: 11.3 g/dL — ABNORMAL LOW (ref 11.6–15.9)
LYMPH%: 34.3 % (ref 14.0–49.7)
MCH: 31.7 pg (ref 25.1–34.0)
MCHC: 32.8 g/dL (ref 31.5–36.0)
MCV: 96.6 fL (ref 79.5–101.0)
MONO#: 0.6 10e3/uL (ref 0.1–0.9)
MONO%: 11.1 % (ref 0.0–14.0)
NEUT#: 2.6 10e3/uL (ref 1.5–6.5)
NEUT%: 52.6 % (ref 38.4–76.8)
Platelets: 187 10e3/uL (ref 145–400)
RBC: 3.56 10e6/uL — ABNORMAL LOW (ref 3.70–5.45)
RDW: 14.8 % — ABNORMAL HIGH (ref 11.2–14.5)
WBC: 5 10e3/uL (ref 3.9–10.3)
lymph#: 1.7 10e3/uL (ref 0.9–3.3)
nRBC: 0 % (ref 0–0)

## 2012-02-04 MED ORDER — SODIUM CHLORIDE 0.9 % IV SOLN
750.0000 mg | Freq: Once | INTRAVENOUS | Status: AC
  Administered 2012-02-04: 750 mg via INTRAVENOUS
  Filled 2012-02-04: qty 75

## 2012-02-04 MED ORDER — DEXAMETHASONE SODIUM PHOSPHATE 4 MG/ML IJ SOLN
20.0000 mg | Freq: Once | INTRAMUSCULAR | Status: AC
  Administered 2012-02-04: 20 mg via INTRAVENOUS

## 2012-02-04 MED ORDER — HEPARIN SOD (PORK) LOCK FLUSH 100 UNIT/ML IV SOLN
500.0000 [IU] | Freq: Once | INTRAVENOUS | Status: AC | PRN
  Administered 2012-02-04: 500 [IU]
  Filled 2012-02-04: qty 5

## 2012-02-04 MED ORDER — PACLITAXEL CHEMO INJECTION 300 MG/50ML
175.0000 mg/m2 | Freq: Once | INTRAVENOUS | Status: AC
  Administered 2012-02-04: 324 mg via INTRAVENOUS
  Filled 2012-02-04: qty 54

## 2012-02-04 MED ORDER — SODIUM CHLORIDE 0.9 % IJ SOLN
10.0000 mL | INTRAMUSCULAR | Status: DC | PRN
  Administered 2012-02-04: 10 mL
  Filled 2012-02-04: qty 10

## 2012-02-04 MED ORDER — DIPHENHYDRAMINE HCL 50 MG/ML IJ SOLN
50.0000 mg | Freq: Once | INTRAMUSCULAR | Status: AC
  Administered 2012-02-04: 50 mg via INTRAVENOUS

## 2012-02-04 MED ORDER — FAMOTIDINE IN NACL 20-0.9 MG/50ML-% IV SOLN
20.0000 mg | Freq: Once | INTRAVENOUS | Status: AC
  Administered 2012-02-04: 20 mg via INTRAVENOUS

## 2012-02-04 MED ORDER — ONDANSETRON 16 MG/50ML IVPB (CHCC)
16.0000 mg | Freq: Once | INTRAVENOUS | Status: AC
  Administered 2012-02-04: 16 mg via INTRAVENOUS

## 2012-02-04 MED ORDER — SODIUM CHLORIDE 0.9 % IV SOLN
Freq: Once | INTRAVENOUS | Status: AC
  Administered 2012-02-04: 12:00:00 via INTRAVENOUS

## 2012-02-04 MED ORDER — LORAZEPAM 2 MG/ML IJ SOLN
0.5000 mg | Freq: Once | INTRAMUSCULAR | Status: AC
  Administered 2012-02-04: 0.5 mg via INTRAVENOUS

## 2012-02-04 NOTE — Patient Instructions (Addendum)
Proceed with chemotherapy today 

## 2012-02-04 NOTE — Telephone Encounter (Signed)
AS OF 02-04-2012 NOTHTING NEEDS TO BE SCHEDULED

## 2012-02-05 ENCOUNTER — Ambulatory Visit (HOSPITAL_BASED_OUTPATIENT_CLINIC_OR_DEPARTMENT_OTHER): Payer: BC Managed Care – PPO

## 2012-02-05 VITALS — BP 151/93 | HR 93 | Temp 98.1°F | Resp 16

## 2012-02-05 DIAGNOSIS — Z5189 Encounter for other specified aftercare: Secondary | ICD-10-CM

## 2012-02-05 DIAGNOSIS — C801 Malignant (primary) neoplasm, unspecified: Secondary | ICD-10-CM

## 2012-02-05 DIAGNOSIS — C7949 Secondary malignant neoplasm of other parts of nervous system: Secondary | ICD-10-CM

## 2012-02-05 DIAGNOSIS — C7931 Secondary malignant neoplasm of brain: Secondary | ICD-10-CM

## 2012-02-05 MED ORDER — PEGFILGRASTIM INJECTION 6 MG/0.6ML
6.0000 mg | Freq: Once | SUBCUTANEOUS | Status: AC
  Administered 2012-02-05: 6 mg via SUBCUTANEOUS

## 2012-02-11 ENCOUNTER — Telehealth: Payer: Self-pay | Admitting: *Deleted

## 2012-02-11 ENCOUNTER — Ambulatory Visit (HOSPITAL_BASED_OUTPATIENT_CLINIC_OR_DEPARTMENT_OTHER): Payer: BC Managed Care – PPO | Admitting: Oncology

## 2012-02-11 ENCOUNTER — Encounter: Payer: Self-pay | Admitting: Oncology

## 2012-02-11 ENCOUNTER — Other Ambulatory Visit (HOSPITAL_BASED_OUTPATIENT_CLINIC_OR_DEPARTMENT_OTHER): Payer: BC Managed Care – PPO | Admitting: Lab

## 2012-02-11 VITALS — BP 102/67 | HR 80 | Temp 98.6°F | Resp 20 | Ht 62.0 in | Wt 183.2 lb

## 2012-02-11 DIAGNOSIS — C7931 Secondary malignant neoplasm of brain: Secondary | ICD-10-CM

## 2012-02-11 DIAGNOSIS — I2699 Other pulmonary embolism without acute cor pulmonale: Secondary | ICD-10-CM

## 2012-02-11 DIAGNOSIS — C801 Malignant (primary) neoplasm, unspecified: Secondary | ICD-10-CM

## 2012-02-11 DIAGNOSIS — I82409 Acute embolism and thrombosis of unspecified deep veins of unspecified lower extremity: Secondary | ICD-10-CM

## 2012-02-11 LAB — PROTIME-INR
INR: 1.7 — ABNORMAL LOW (ref 2.00–3.50)
Protime: 20.4 Seconds — ABNORMAL HIGH (ref 10.6–13.4)

## 2012-02-11 LAB — CBC WITH DIFFERENTIAL/PLATELET
BASO%: 0.2 % (ref 0.0–2.0)
EOS%: 1.6 % (ref 0.0–7.0)
Eosinophils Absolute: 0.2 10*3/uL (ref 0.0–0.5)
LYMPH%: 19.6 % (ref 14.0–49.7)
MCH: 33.4 pg (ref 25.1–34.0)
MCHC: 34.5 g/dL (ref 31.5–36.0)
MCV: 97 fL (ref 79.5–101.0)
MONO%: 6.3 % (ref 0.0–14.0)
NEUT#: 8.5 10*3/uL — ABNORMAL HIGH (ref 1.5–6.5)
Platelets: 192 10*3/uL (ref 145–400)
RBC: 3.53 10*6/uL — ABNORMAL LOW (ref 3.70–5.45)
RDW: 15.4 % — ABNORMAL HIGH (ref 11.2–14.5)

## 2012-02-11 LAB — COMPREHENSIVE METABOLIC PANEL (CC13)
BUN: 14 mg/dL (ref 7.0–26.0)
CO2: 28 mEq/L (ref 22–29)
Creatinine: 0.7 mg/dL (ref 0.6–1.1)
Glucose: 150 mg/dl — ABNORMAL HIGH (ref 70–99)
Total Bilirubin: 0.27 mg/dL (ref 0.20–1.20)

## 2012-02-11 MED ORDER — GABAPENTIN 100 MG PO CAPS: ORAL_CAPSULE | ORAL | Status: DC

## 2012-02-11 NOTE — Progress Notes (Signed)
OFFICE PROGRESS NOTE  CC  [name], MD [address] [place] [address] Dr. Shirlean Kelly Dr. Dorothy Puffer Dr. [name]  DIAGNOSIS: 56 year old female with metastatic adenocarcinoma with brain metastasis Unknown primary but possibly an upper GI source versus lung primary  PRIOR THERAPY:  #1 patient underwent resection on 6/12 of brain met with the pathology showing a adenocarcinoma likely upper GI primary. Patient has also had SRS on 10/06/2011.  #2 patient has had multiple staging studies including PET CT which showed a 7 mm nodule in the right upper lobe which was not seen previously on CT of the chest. She subsequently was seen by Dr. Craige Cotta in pulmonary for possible bronchoscopy/biopsy.  #3 patient also has had consultation with Dr. [name] she has undergone endoscopy colonoscopy without any evidence of a primary. She has undergone capsule endoscopy results of which are pending. Patient also has had MRCP and that was negative.  #4 Patient to begin chemotherapy pending results of the capsule endoscopy. If the capsule endoscopy is negative she will begin chemotherapy consisting of Taxol carboplatinum given every 21 days for a total of 4-6 cycles. First cycle given on 11/11/11  CURRENT THERAPY: Status post cycle 5 of carbo Taxol.  INTERVAL HISTORY: Haley Lowe 56 y.o. female returns for Followup visit today. Overall patient tolerated cycle 5 of her chemotherapy well. She is complaining of little bit of numbness in her toes and some tingling in her fingers. She otherwise denies any shortness of breath chest pains palpitations no myalgias and arthralgias no cough hemoptysis hematemesis no nausea vomiting no diarrhea or constipation. Remainder of the 10 point review of systems is negative.  MEDICAL HISTORY: Past Medical History  Diagnosis Date  . Hypertension   . PONV (postoperative nausea and vomiting)   . Anxiety   . Depression   . COPD  (chronic obstructive pulmonary disease)   . GERD (gastroesophageal reflux disease)   . Headache   . Arthritis   . Adenocarcinoma 09/28/2011  . Hx of adenomatous colonic polyps   . Pulmonary nodules   . Diabetes mellitus   . Emphysema   . 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 Allergy     hips and shoulders  . Cataract     1 lens implaqnt right eye,intact on left eye  . Goiter     nodule watching  . Fibromyalgia     neuropathy tops of both feet  . Metastasis to brain 09/15/11     left frontoparietal region  . History of radiation therapy 10/06/11    1 fraction  15 gray  . Pulmonary emboli 11/04/11    right upper lobe and r lower lobe PE  . Lung nodule seen on imaging study 11/04/11 CT    4mm LLL  . DVT (deep venous thrombosis) 12/09/2011  . Pulmonary embolism 12/09/2011    ALLERGIES:  is allergic to penicillins and codeine.  MEDICATIONS:  Present medications are reviewed and updated  SURGICAL HISTORY:  Past Surgical History  Procedure Date  . Abdominal hysterectomy 1996  . Cholecystectomy 2000  . Shoulder surgery 1998    right  . Eye surgery 2001    vision correction  . Foot surgery 2005    left  . Craniotomy 09/15/2011    Procedure: CRANIOTOMY TUMOR EXCISION;  Surgeon: [name], MD;  Location: [facility];  Service: Neurosurgery;  Laterality: N/A;  Craniotomy resection of tumor with stealth  .  Cataract extraction 2011    with lens implant    ECOG PERFORMANCE STATUS: 1 - Symptomatic but completely ambulatory HEENT exam EOMI PERRLA sclerae anicteric no conjunctival pallor oral mucosa is moist neck is supple lungs are clear bilaterally to auscultation cardiovascular is regular rate rhythm abdomen is soft nontender nondistended bowel sounds are present no HSM extremities no edema neuro patient's alert oriented otherwise nonfocal. Blood pressure 102/67, pulse 80, temperature 98.6 F (37 C), resp. rate 20, height 5\' 2"  (1.575 m), weight  183 lb 3.2 oz (83.099 kg).  LABORATORY DATA: Lab Results  Component Value Date   WBC 11.8* 02/11/2012   HGB 11.8 02/11/2012   HCT 34.2* 02/11/2012   MCV 97.0 02/11/2012   PLT 192 02/11/2012      Chemistry      Component Value Date/Time   NA 140 02/04/2012 0857   NA 138 11/18/2011 1153   K 3.7 02/04/2012 0857   K 3.6 11/18/2011 1153   CL 103 02/04/2012 0857   CL 100 11/18/2011 1153   CO2 27 02/04/2012 0857   CO2 27 11/18/2011 1153   BUN 12.0 02/04/2012 0857   BUN 14 11/18/2011 1153   CREATININE 0.6 02/04/2012 0857   CREATININE 0.65 11/18/2011 1153      Component Value Date/Time   CALCIUM 9.6 02/04/2012 0857   CALCIUM 8.8 11/18/2011 1153   ALKPHOS 94 02/04/2012 0857   ALKPHOS 112 11/18/2011 1153   AST 22 02/04/2012 0857   AST 13 11/18/2011 1153   ALT 45 02/04/2012 0857   ALT 18 11/18/2011 1153   BILITOT 0.25 02/04/2012 0857   BILITOT 0.3 11/18/2011 1153     RADIOGRAPHIC STUDIES:  ASSESSMENT: 56 year old female with  #1 metastatic adenocarcinoma of unknown primary with brain metastasis. Patient is now status post  SRS. Post procedure she is doing quite well.  #2 we have performed extensive studies to try to elucidate the primary for at the adenocarcinoma unfortunately up-to-date all scans mammograms and procedures have been negative. Patient's PET scan did show a 7 millimeter nodule that had hypermetabolic activity concerning for a lung primary in this smoker.Further evaluation will be performed by pulmonary.  #3 Patient is pulmonary embolism as well as left lower extremity DVT. She had a Greenfield filter placed just recently. Patient's INR is therapeutic and I have recommended that she discontinue the Lovenox and continue the Coumadin at 5 mg daily. She will have her INRs checked at Dr. Sharyon Medicus office and have the results faxed to [place].  #4 patient is status post cycle 5 of her chemotherapy. After her fourth cycle we did get CT scans and a PET scan. Which showed resolution of the 7 mm nodule in  the lung. She was also seen by Dr. Shirline Frees who has recommended continuation of this chemotherapy as well as possibly going on to maintenance treatment.  PLAN:  #1 patient is now status post cycle 5 of chemotherapy. Overall she's doing well.  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  #3 she will return in 2 weeks' time for cycle #6 of her chemotherapy. All questions were answered. The patient knows to call the clinic with any problems, questions or concerns. We can certainly see the patient much sooner if necessary.  I spent 25 minutes counseling the patient face to face. The total time spent in the appointment was 30 minutes.    Drue Second,  MD Medical/Oncology [facility] [phone] (beeper) [phone] (Office)

## 2012-02-11 NOTE — Telephone Encounter (Signed)
As of 02-11-2012 nothing needs to be scheduled for this patient

## 2012-02-11 NOTE — Patient Instructions (Addendum)
Begin B complex  Neurontin 200 mg at bedtime  I will see you back in 2 weeks as scheduled

## 2012-02-16 NOTE — Progress Notes (Signed)
OFFICE PROGRESS NOTE  CC  [name], MD [address] [place] [address] Dr. Shirlean Kelly Dr. Dorothy Puffer Dr. [name]  DIAGNOSIS: 56 year old female with metastatic adenocarcinoma with brain metastasis Unknown primary but possibly an upper GI source versus lung primary  PRIOR THERAPY:  #1 patient underwent resection on 6/12 of brain met with the pathology showing a adenocarcinoma likely upper GI primary. Patient has also had SRS on 10/06/2011.  #2 patient has had multiple staging studies including PET CT which showed a 7 mm nodule in the right upper lobe which was not seen previously on CT of the chest. She subsequently was seen by Dr. Craige Cotta in pulmonary for possible bronchoscopy/biopsy.  #3 patient also has had consultation with Dr. [name] she has undergone endoscopy colonoscopy without any evidence of a primary. She has undergone capsule endoscopy results of which are pending. Patient also has had MRCP and that was negative.  #4 Patient to begin chemotherapy pending results of the capsule endoscopy. If the capsule endoscopy is negative she will begin chemotherapy consisting of Taxol carboplatinum given every 21 days for a total of 4-6 cycles. First cycle given on 11/11/11  CURRENT THERAPY Cycle cycle 5 of Taxol and carboplatinum.  INTERVAL HISTORY: Haley Lowe 56 y.o. female returns for follow up today.  Overall patient is doing well she is accompanied by her daughter and husband. They have met with Dr. Shirline Frees who has made several recommendations and I have reviewed these with the patient and her family. They do understand that he has recommended in continuing the chemotherapy as it has been planned for the next 6 cycles. He also has recommended maintenance chemotherapy with Alimta. We again discussed the rationale for continuation of Taxol and carboplatinum to 6 cycles as well as maintenance Alimta. Patient currently feels well blood counts  look good. She will proceed with her chemotherapy.   MEDICAL HISTORY: Past Medical History  Diagnosis Date  . Hypertension   . PONV (postoperative nausea and vomiting)   . Anxiety   . Depression   . COPD (chronic obstructive pulmonary disease)   . GERD (gastroesophageal reflux disease)   . Headache   . Arthritis   . Adenocarcinoma 09/28/2011  . Hx of adenomatous colonic polyps   . Pulmonary nodules   . Diabetes mellitus   . Emphysema   . 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 Allergy     hips and shoulders  . Cataract     1 lens implaqnt right eye,intact on left eye  . Goiter     nodule watching  . Fibromyalgia     neuropathy tops of both feet  . Metastasis to brain 09/15/11     left frontoparietal region  . History of radiation therapy 10/06/11    1 fraction  15 gray  . Pulmonary emboli 11/04/11    right upper lobe and r lower lobe PE  . Lung nodule seen on imaging study 11/04/11 CT    4mm LLL  . DVT (deep venous thrombosis) 12/09/2011  . Pulmonary embolism 12/09/2011    ALLERGIES:  is allergic to penicillins and codeine.  MEDICATIONS:  Present medications are reviewed and updated  SURGICAL HISTORY:  Past Surgical History  Procedure Date  . Abdominal hysterectomy 1996  . Cholecystectomy 2000  . Shoulder surgery 1998    right  . Eye surgery 2001    vision correction  . Foot surgery 2005  left  . Craniotomy 09/15/2011    Procedure: CRANIOTOMY TUMOR EXCISION;  Surgeon: [name], MD;  Location: [facility];  Service: Neurosurgery;  Laterality: N/A;  Craniotomy resection of tumor with stealth  . Cataract extraction 2011    with lens implant    REVIEW OF SYSTEMS: General: fatigue (-), night sweats (-), fever (-), pain (-) Lymph: palpable nodes (-) HEENT: vision changes (-), mucositis (-), gum bleeding (-), epistaxis (-) Cardiovascular: chest pain (-), palpitations (-) Pulmonary: shortness of breath (-), dyspnea on exertion  (-), cough (-), hemoptysis (-) GI:  Early satiety (-), melena (-), dysphagia (-), nausea/vomiting (-), diarrhea (-) GU: dysuria (-), hematuria (-), incontinence (-) Musculoskeletal: joint swelling (-), joint pain (-), back pain (-) Neuro: weakness (-), numbness (-), headache (-), confusion (-) Skin: Rash (-), lesions (-), dryness (-) Psych: depression (-), suicidal/homicidal ideation (-), feeling of hopelessness (-)  PHYSICAL EXAMINATION: BP 144/83  Pulse 83  Temp 98.4 F (36.9 C) (Oral)  Resp 20  Ht 5\' 2"  (1.575 m)  Wt 186 lb 1.6 oz (84.414 kg)  BMI 34.04 kg/m2 General: Patient is a well appearing female in no acute distress HEENT: PERRLA, sclerae anicteric no conjunctival pallor, MMM Neck: supple, no palpable adenopathy Lungs: clear to auscultation bilaterally, no wheezes, rhonchi, or rales Cardiovascular: regular rate rhythm, S1, S2, no murmurs, rubs or gallops Abdomen: Soft, non-tender, non-distended, normoactive bowel sounds, no HSM Extremities: warm and well perfused, no clubbing, cyanosis, or edema Skin: No rashes or lesions Neuro: Non-focal, patient is unable to pick up coin sitting on table unless uses fingernails for assistance ECOG PERFORMANCE STATUS: 1 - Symptomatic but completely ambulatory  LABORATORY DATA: Lab Results  Component Value Date   WBC 11.8* 02/11/2012   HGB 11.8 02/11/2012   HCT 34.2* 02/11/2012   MCV 97.0 02/11/2012   PLT 192 02/11/2012      Chemistry      Component Value Date/Time   NA 138 02/11/2012 0926   NA 138 11/18/2011 1153   K 3.5 02/11/2012 0926   K 3.6 11/18/2011 1153   CL 101 02/11/2012 0926   CL 100 11/18/2011 1153   CO2 28 02/11/2012 0926   CO2 27 11/18/2011 1153   BUN 14.0 02/11/2012 0926   BUN 14 11/18/2011 1153   CREATININE 0.7 02/11/2012 0926   CREATININE 0.65 11/18/2011 1153      Component Value Date/Time   CALCIUM 9.5 02/11/2012 0926   CALCIUM 8.8 11/18/2011 1153   ALKPHOS 115 02/11/2012 0926   ALKPHOS 112 11/18/2011 1153   AST 14  02/11/2012 0926   AST 13 11/18/2011 1153   ALT 35 02/11/2012 0926   ALT 18 11/18/2011 1153   BILITOT 0.27 02/11/2012 0926   BILITOT 0.3 11/18/2011 1153     RADIOGRAPHIC STUDIES:  ASSESSMENT: 56 year old female with  #1 metastatic adenocarcinoma of unknown primary with brain metastasis. Patient is now status post  SRS. Post procedure she is doing quite well.  #2 we have performed extensive studies to try to elucidate the primary for at the adenocarcinoma unfortunately up-to-date all scans mammograms and procedures have been negative. Patient's PET scan did show a 7 millimeter nodule that had hypermetabolic activity concerning for a lung primary in this smoker.Further evaluation will be performed by pulmonary.  #3 Patient is pulmonary embolism as well as left lower extremity DVT. She had a Greenfield filter placed just recently. Patient's INR is therapeutic and I have recommended that she discontinue the Lovenox and continue the  Coumadin at 5 mg daily. She will have her INRs checked at Dr. Sharyon Medicus office and have the results faxed to [place].  #4 patient  Is very and extremely anxious about her diagnosis she still thinks that she is curable however she and I. And her daughter had a very extensive discussion right from the beginning that this is not a curable disease and that we will continue to treat her as long as she is able to tolerate the chemotherapy. We certainly do plan on doing restaging studies and this will calm after her third cycle of the chemotherapy. I did explain to the patient that we are not sure exactly what we are treating we could possibly be treating her lung cancer. Certainly her PET scan did have increased FDG uptake in the right lung. And in spite of my asking the pulmonary medicine folks to possibly do a biopsy did do think that the biopsy was not feasible. She did have a response visible by the last PET scan.    #5 patient was seen by Dr. Arlis Porta from my practice who  specializes in lung cancer. He agrees with continuing chemotherapy with Taxol and carboplatinum. He also recommended the possibility of dose reducing the Taxol if patient develops significant peripheral neuropathies. I do not think that this needs to be dose reduced at this point. However I did discuss using Neurontin to help with management of the neuropathy should happen. Dr. Shirline Frees also recommended using Alimta as maintenance therapy.  PLAN:  #1 patient will proceed with cycle #5 of chemotherapy.  #2 she and I did discuss maintenance chemotherapy with Alimta.  #3 she will return in one week's time for followup.  All questions were answered. The patient knows to call the clinic with any problems, questions or concerns. We can certainly see the patient much sooner if necessary.  I spent 25 minutes counseling the patient face to face. The total time spent in the appointment was 30 minutes.  Drue Second, MD Medical/Oncology [facility] [phone] (beeper) [phone] (Office)

## 2012-02-25 ENCOUNTER — Ambulatory Visit (HOSPITAL_BASED_OUTPATIENT_CLINIC_OR_DEPARTMENT_OTHER): Payer: BC Managed Care – PPO

## 2012-02-25 ENCOUNTER — Ambulatory Visit (HOSPITAL_BASED_OUTPATIENT_CLINIC_OR_DEPARTMENT_OTHER): Payer: BC Managed Care – PPO | Admitting: Adult Health

## 2012-02-25 ENCOUNTER — Other Ambulatory Visit (HOSPITAL_BASED_OUTPATIENT_CLINIC_OR_DEPARTMENT_OTHER): Payer: BC Managed Care – PPO | Admitting: Lab

## 2012-02-25 ENCOUNTER — Encounter: Payer: Self-pay | Admitting: Adult Health

## 2012-02-25 VITALS — BP 101/67 | HR 86 | Temp 98.3°F | Resp 20 | Ht 62.0 in | Wt 188.5 lb

## 2012-02-25 DIAGNOSIS — C7949 Secondary malignant neoplasm of other parts of nervous system: Secondary | ICD-10-CM

## 2012-02-25 DIAGNOSIS — Z5111 Encounter for antineoplastic chemotherapy: Secondary | ICD-10-CM

## 2012-02-25 DIAGNOSIS — I82409 Acute embolism and thrombosis of unspecified deep veins of unspecified lower extremity: Secondary | ICD-10-CM

## 2012-02-25 DIAGNOSIS — C801 Malignant (primary) neoplasm, unspecified: Secondary | ICD-10-CM

## 2012-02-25 DIAGNOSIS — C7931 Secondary malignant neoplasm of brain: Secondary | ICD-10-CM

## 2012-02-25 DIAGNOSIS — R911 Solitary pulmonary nodule: Secondary | ICD-10-CM

## 2012-02-25 DIAGNOSIS — I2699 Other pulmonary embolism without acute cor pulmonale: Secondary | ICD-10-CM

## 2012-02-25 LAB — CBC WITH DIFFERENTIAL/PLATELET
BASO%: 0.8 % (ref 0.0–2.0)
Eosinophils Absolute: 0.1 10*3/uL (ref 0.0–0.5)
MCV: 96.7 fL (ref 79.5–101.0)
MONO#: 0.5 10*3/uL (ref 0.1–0.9)
MONO%: 12.5 % (ref 0.0–14.0)
NEUT#: 2.1 10*3/uL (ref 1.5–6.5)
RBC: 3.67 10*6/uL — ABNORMAL LOW (ref 3.70–5.45)
RDW: 14.9 % — ABNORMAL HIGH (ref 11.2–14.5)
WBC: 3.8 10*3/uL — ABNORMAL LOW (ref 3.9–10.3)
nRBC: 0 % (ref 0–0)

## 2012-02-25 LAB — COMPREHENSIVE METABOLIC PANEL (CC13)
ALT: 58 U/L — ABNORMAL HIGH (ref 0–55)
AST: 27 U/L (ref 5–34)
CO2: 29 mEq/L (ref 22–29)
Sodium: 137 mEq/L (ref 136–145)
Total Bilirubin: 0.22 mg/dL (ref 0.20–1.20)
Total Protein: 6.4 g/dL (ref 6.4–8.3)

## 2012-02-25 LAB — PROTIME-INR
INR: 3.5 (ref 2.00–3.50)
Protime: 42 Seconds — ABNORMAL HIGH (ref 10.6–13.4)

## 2012-02-25 MED ORDER — SODIUM CHLORIDE 0.9 % IV SOLN
Freq: Once | INTRAVENOUS | Status: AC
  Administered 2012-02-25: 11:00:00 via INTRAVENOUS

## 2012-02-25 MED ORDER — LORAZEPAM 2 MG/ML IJ SOLN
0.5000 mg | Freq: Once | INTRAMUSCULAR | Status: AC
  Administered 2012-02-25: 0.5 mg via INTRAVENOUS

## 2012-02-25 MED ORDER — PACLITAXEL CHEMO INJECTION 300 MG/50ML
175.0000 mg/m2 | Freq: Once | INTRAVENOUS | Status: AC
  Administered 2012-02-25: 324 mg via INTRAVENOUS
  Filled 2012-02-25: qty 54

## 2012-02-25 MED ORDER — SODIUM CHLORIDE 0.9 % IJ SOLN
10.0000 mL | INTRAMUSCULAR | Status: DC | PRN
  Administered 2012-02-25: 10 mL
  Filled 2012-02-25: qty 10

## 2012-02-25 MED ORDER — FAMOTIDINE IN NACL 20-0.9 MG/50ML-% IV SOLN
20.0000 mg | Freq: Once | INTRAVENOUS | Status: AC
  Administered 2012-02-25: 20 mg via INTRAVENOUS

## 2012-02-25 MED ORDER — ONDANSETRON 16 MG/50ML IVPB (CHCC)
16.0000 mg | Freq: Once | INTRAVENOUS | Status: AC
  Administered 2012-02-25: 16 mg via INTRAVENOUS

## 2012-02-25 MED ORDER — HEPARIN SOD (PORK) LOCK FLUSH 100 UNIT/ML IV SOLN
500.0000 [IU] | Freq: Once | INTRAVENOUS | Status: AC | PRN
  Administered 2012-02-25: 500 [IU]
  Filled 2012-02-25: qty 5

## 2012-02-25 MED ORDER — DIPHENHYDRAMINE HCL 50 MG/ML IJ SOLN
50.0000 mg | Freq: Once | INTRAMUSCULAR | Status: AC
  Administered 2012-02-25: 50 mg via INTRAVENOUS

## 2012-02-25 MED ORDER — SODIUM CHLORIDE 0.9 % IV SOLN
750.0000 mg | Freq: Once | INTRAVENOUS | Status: AC
  Administered 2012-02-25: 750 mg via INTRAVENOUS
  Filled 2012-02-25: qty 75

## 2012-02-25 MED ORDER — DEXAMETHASONE SODIUM PHOSPHATE 4 MG/ML IJ SOLN
20.0000 mg | Freq: Once | INTRAMUSCULAR | Status: AC
  Administered 2012-02-25: 20 mg via INTRAVENOUS

## 2012-02-25 NOTE — Progress Notes (Signed)
OFFICE PROGRESS NOTE  CC  [name], MD [address] [place] [address] Dr. Shirlean Kelly Dr. Dorothy Puffer Dr. [name]  DIAGNOSIS: 56 year old female with metastatic adenocarcinoma with brain metastasis Unknown primary but possibly an upper GI source versus lung primary  PRIOR THERAPY:  #1 patient underwent resection on 6/12 of brain met with the pathology showing a adenocarcinoma likely upper GI primary. Patient has also had SRS on 10/06/2011.  #2 patient has had multiple staging studies including PET CT which showed a 7 mm nodule in the right upper lobe which was not seen previously on CT of the chest. She subsequently was seen by Dr. Craige Cotta in pulmonary for possible bronchoscopy/biopsy.  #3 patient also has had consultation with Dr. [name] she has undergone endoscopy colonoscopy without any evidence of a primary. She has undergone capsule endoscopy results of which are pending. Patient also has had MRCP and that was negative.  #4 Patient to begin chemotherapy pending results of the capsule endoscopy. If the capsule endoscopy is negative she will begin chemotherapy consisting of Taxol carboplatinum given every 21 days for a total of 4-6 cycles. First cycle given on 11/11/11  CURRENT THERAPY: cycle 6 day 1 of Carbo taxol with Neulasta support  INTERVAL HISTORY: Haley Lowe 56 y.o. female returns for Followup visit today. She's doing well.  She was taking Vitamin b that Dr. Welton Flakes had recommended, however experienced joint pain very quickly after starting this.  She's since then stopped and the pain has started to subside.  She continues to have numbness in her fingertips and toes, but has not had any motor function issues and can still button, and clasp her earrings.  She has noticed that her legs get tired when going on her night time walks, but is still able to walk without any difficulty.    MEDICAL HISTORY: Past Medical History  Diagnosis Date    . Hypertension   . PONV (postoperative nausea and vomiting)   . Anxiety   . Depression   . COPD (chronic obstructive pulmonary disease)   . GERD (gastroesophageal reflux disease)   . Headache   . Arthritis   . Adenocarcinoma 09/28/2011  . Hx of adenomatous colonic polyps   . Pulmonary nodules   . Diabetes mellitus   . Emphysema   . 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 Allergy     hips and shoulders  . Cataract     1 lens implaqnt right eye,intact on left eye  . Goiter     nodule watching  . Fibromyalgia     neuropathy tops of both feet  . Metastasis to brain 09/15/11     left frontoparietal region  . History of radiation therapy 10/06/11    1 fraction  15 gray  . Pulmonary emboli 11/04/11    right upper lobe and r lower lobe PE  . Lung nodule seen on imaging study 11/04/11 CT    4mm LLL  . DVT (deep venous thrombosis) 12/09/2011  . Pulmonary embolism 12/09/2011    ALLERGIES:  is allergic to penicillins and codeine.  MEDICATIONS:  Present medications are reviewed and updated  SURGICAL HISTORY:  Past Surgical History  Procedure Date  . Abdominal hysterectomy 1996  . Cholecystectomy 2000  . Shoulder surgery 1998    right  . Eye surgery 2001    vision correction  . Foot surgery 2005    left  . Craniotomy  09/15/2011    Procedure: CRANIOTOMY TUMOR EXCISION;  Surgeon: [name], MD;  Location: [facility];  Service: Neurosurgery;  Laterality: N/A;  Craniotomy resection of tumor with stealth  . Cataract extraction 2011    with lens implant   REVIEW OF SYSTEMS: General: fatigue (-), night sweats (-), fever (-), pain (-) Lymph: palpable nodes (-) HEENT: vision changes (-), mucositis (-), gum bleeding (-), epistaxis (-) Cardiovascular: chest pain (-), palpitations (-) Pulmonary: shortness of breath (-), dyspnea on exertion (-), cough (-), hemoptysis (-) GI:  Early satiety (-), melena (-), dysphagia (-), nausea/vomiting (-), diarrhea  (-) GU: dysuria (-), hematuria (-), incontinence (-) Musculoskeletal: joint swelling (-), joint pain (-), back pain (-) Neuro: weakness (-), numbness (-), headache (-), confusion (-) Skin: Rash (-), lesions (-), dryness (-) Psych: depression (-), suicidal/homicidal ideation (-), feeling of hopelessness (-)  PHYSICAL EXAM:  BP 101/67  Pulse 86  Temp 98.3 F (36.8 C) (Oral)  Resp 20  Ht 5\' 2"  (1.575 m)  Wt 188 lb 8 oz (85.503 kg)  BMI 34.48 kg/m2 General: Patient is a well appearing female in no acute distress HEENT: PERRLA, sclerae anicteric no conjunctival pallor, MMM Neck: supple, no palpable adenopathy Lungs: clear to auscultation bilaterally, no wheezes, rhonchi, or rales Cardiovascular: regular rate rhythm, S1, S2, no murmurs, rubs or gallops Abdomen: Soft, non-tender, non-distended, normoactive bowel sounds, no HSM Extremities: warm and well perfused, no clubbing, cyanosis, or edema Skin: No rashes or lesions Neuro: Non-focal ECOG PERFORMANCE STATUS: 1 - Symptomatic but completely ambulatory  LABORATORY DATA: Lab Results  Component Value Date   WBC 3.8* 02/25/2012   HGB 11.7 02/25/2012   HCT 35.5 02/25/2012   MCV 96.7 02/25/2012   PLT 181 02/25/2012      Chemistry      Component Value Date/Time   NA 138 02/11/2012 0926   NA 138 11/18/2011 1153   K 3.5 02/11/2012 0926   K 3.6 11/18/2011 1153   CL 101 02/11/2012 0926   CL 100 11/18/2011 1153   CO2 28 02/11/2012 0926   CO2 27 11/18/2011 1153   BUN 14.0 02/11/2012 0926   BUN 14 11/18/2011 1153   CREATININE 0.7 02/11/2012 0926   CREATININE 0.65 11/18/2011 1153      Component Value Date/Time   CALCIUM 9.5 02/11/2012 0926   CALCIUM 8.8 11/18/2011 1153   ALKPHOS 115 02/11/2012 0926   ALKPHOS 112 11/18/2011 1153   AST 14 02/11/2012 0926   AST 13 11/18/2011 1153   ALT 35 02/11/2012 0926   ALT 18 11/18/2011 1153   BILITOT 0.27 02/11/2012 0926   BILITOT 0.3 11/18/2011 1153     RADIOGRAPHIC STUDIES:  ASSESSMENT: 56 year old  female with  #1 metastatic adenocarcinoma of unknown primary with brain metastasis. Patient is now status post  SRS. Post procedure she is doing quite well.  #2 we have performed extensive studies to try to elucidate the primary for at the adenocarcinoma unfortunately up-to-date all scans mammograms and procedures have been negative. Patient's PET scan did show a 7 millimeter nodule that had hypermetabolic activity concerning for a lung primary in this smoker.Further evaluation will be performed by pulmonary.  #3 Patient is pulmonary embolism as well as left lower extremity DVT. She had a [place] filter placed just recently. Patient's INR is therapeutic and I have recommended that she discontinue the Lovenox and continue the Coumadin at 5 mg daily. She will have her INRs checked at Dr. Sharyon Medicus office and have the results  faxed to [place].  #4 patient is status post cycle 5 of her chemotherapy. After her fourth cycle we did get CT scans and a PET scan. Which showed resolution of the 7 mm nodule in the lung. She was also seen by Dr. Shirline Frees who has recommended continuation of this chemotherapy as well as possibly going on to maintenance treatment.  PLAN:  #1 Proceed with cycle 6 of chemotherapy.    #2 Neuropathy has remained unchanged.  Will continue neurontin.  Have stopped b complex due to joint aches.    #3 I recommended she walk 10 min/day x 1 week, 15 min day the next, and continue to slowly build her stamina up.    #4 We will see her back next week for an interim lab.   All questions were answered. The patient knows to call the clinic with any problems, questions or concerns. We can certainly see the patient much sooner if necessary.  I spent 25 minutes counseling the patient face to face. The total time spent in the appointment was 30 minutes.  This case was reviewed by Dr. Welton Flakes.  Cherie Ouch Lyn Hollingshead, NP Medical Oncology [facility] Phone: [phone]

## 2012-02-25 NOTE — Patient Instructions (Signed)
Doing well.  Proceed with chemo.  Continue Coumadin, take 5mg  daily.

## 2012-02-25 NOTE — Patient Instructions (Addendum)
[facility] Discharge Instructions for Patients Receiving Chemotherapy  Today you received the following chemotherapy agents Taxol/Carboplatin To help prevent nausea and vomiting after your treatment, we encourage you to take your nausea medication as prescribed.  If you develop nausea and vomiting that is not controlled by your nausea medication, call the clinic. If it is after clinic hours your family physician or the after hours number for the clinic or go to the Emergency Department.   BELOW ARE SYMPTOMS THAT SHOULD BE REPORTED IMMEDIATELY:  *FEVER GREATER THAN 100.5 F  *CHILLS WITH OR WITHOUT FEVER  NAUSEA AND VOMITING THAT IS NOT CONTROLLED WITH YOUR NAUSEA MEDICATION  *UNUSUAL SHORTNESS OF BREATH  *UNUSUAL BRUISING OR BLEEDING  TENDERNESS IN MOUTH AND THROAT WITH OR WITHOUT PRESENCE OF ULCERS  *URINARY PROBLEMS  *BOWEL PROBLEMS  UNUSUAL RASH Items with * indicate a potential emergency and should be followed up as soon as possible.  One of the nurses will contact you 24 hours after your treatment. Please let the nurse know about any problems that you may have experienced. Feel free to call the clinic you have any questions or concerns. The clinic phone number is [phone].   I have been informed and understand all the instructions given to me. I know to contact the clinic, my physician, or go to the Emergency Department if any problems should occur. I do not have any questions at this time, but understand that I may call the clinic during office hours   should I have any questions or need assistance in obtaining follow up care.    __________________________________________  _____________  __________ Signature of Patient or Authorized Representative            Date                   Time    __________________________________________ Nurse's Signature    

## 2012-02-26 ENCOUNTER — Ambulatory Visit (HOSPITAL_BASED_OUTPATIENT_CLINIC_OR_DEPARTMENT_OTHER): Payer: BC Managed Care – PPO

## 2012-02-26 VITALS — BP 119/76 | HR 101 | Temp 98.8°F

## 2012-02-26 DIAGNOSIS — Z5189 Encounter for other specified aftercare: Secondary | ICD-10-CM

## 2012-02-26 DIAGNOSIS — C7931 Secondary malignant neoplasm of brain: Secondary | ICD-10-CM

## 2012-02-26 DIAGNOSIS — C801 Malignant (primary) neoplasm, unspecified: Secondary | ICD-10-CM

## 2012-02-26 MED ORDER — PEGFILGRASTIM INJECTION 6 MG/0.6ML
6.0000 mg | Freq: Once | SUBCUTANEOUS | Status: AC
  Administered 2012-02-26: 6 mg via SUBCUTANEOUS

## 2012-03-03 ENCOUNTER — Ambulatory Visit (HOSPITAL_BASED_OUTPATIENT_CLINIC_OR_DEPARTMENT_OTHER): Payer: BC Managed Care – PPO | Admitting: Oncology

## 2012-03-03 ENCOUNTER — Encounter: Payer: Self-pay | Admitting: Oncology

## 2012-03-03 ENCOUNTER — Other Ambulatory Visit (HOSPITAL_BASED_OUTPATIENT_CLINIC_OR_DEPARTMENT_OTHER): Payer: BC Managed Care – PPO | Admitting: Lab

## 2012-03-03 ENCOUNTER — Telehealth: Payer: Self-pay | Admitting: *Deleted

## 2012-03-03 VITALS — BP 106/72 | HR 80 | Temp 98.4°F | Resp 20 | Ht 62.0 in | Wt 185.5 lb

## 2012-03-03 DIAGNOSIS — C7949 Secondary malignant neoplasm of other parts of nervous system: Secondary | ICD-10-CM

## 2012-03-03 DIAGNOSIS — C801 Malignant (primary) neoplasm, unspecified: Secondary | ICD-10-CM

## 2012-03-03 DIAGNOSIS — C7931 Secondary malignant neoplasm of brain: Secondary | ICD-10-CM

## 2012-03-03 DIAGNOSIS — I82409 Acute embolism and thrombosis of unspecified deep veins of unspecified lower extremity: Secondary | ICD-10-CM

## 2012-03-03 DIAGNOSIS — I2699 Other pulmonary embolism without acute cor pulmonale: Secondary | ICD-10-CM

## 2012-03-03 LAB — COMPREHENSIVE METABOLIC PANEL (CC13)
AST: 20 U/L (ref 5–34)
Albumin: 3.6 g/dL (ref 3.5–5.0)
Alkaline Phosphatase: 115 U/L (ref 40–150)
BUN: 15 mg/dL (ref 7.0–26.0)
Potassium: 3.8 mEq/L (ref 3.5–5.1)
Sodium: 140 mEq/L (ref 136–145)

## 2012-03-03 LAB — CBC WITH DIFFERENTIAL/PLATELET
Basophils Absolute: 0.1 10*3/uL (ref 0.0–0.1)
EOS%: 1.1 % (ref 0.0–7.0)
MCH: 32.7 pg (ref 25.1–34.0)
MCHC: 33.6 g/dL (ref 31.5–36.0)
MCV: 97.2 fL (ref 79.5–101.0)
MONO%: 7.7 % (ref 0.0–14.0)
RBC: 3.61 10*6/uL — ABNORMAL LOW (ref 3.70–5.45)
RDW: 15.1 % — ABNORMAL HIGH (ref 11.2–14.5)

## 2012-03-03 MED ORDER — ONDANSETRON HCL 8 MG PO TABS: 8.0000 mg | ORAL_TABLET | Freq: Two times a day (BID) | ORAL | Status: DC

## 2012-03-03 MED ORDER — LORAZEPAM 0.5 MG PO TABS: 0.5000 mg | ORAL_TABLET | Freq: Four times a day (QID) | ORAL | Status: DC | PRN

## 2012-03-03 MED ORDER — PROCHLORPERAZINE 25 MG RE SUPP: 25.0000 mg | Freq: Two times a day (BID) | RECTAL | Status: DC | PRN

## 2012-03-03 MED ORDER — FOLIC ACID 1 MG PO TABS: 1.0000 mg | ORAL_TABLET | Freq: Every day | ORAL | Status: DC

## 2012-03-03 MED ORDER — PROCHLORPERAZINE MALEATE 10 MG PO TABS: 10.0000 mg | ORAL_TABLET | Freq: Four times a day (QID) | ORAL | Status: DC | PRN

## 2012-03-03 MED ORDER — DEXAMETHASONE 4 MG PO TABS: ORAL_TABLET | ORAL | Status: DC

## 2012-03-03 NOTE — Patient Instructions (Addendum)
Doing well  We discussed maintenance chemotherapy with Alimta to begin on 04/06/12  You will need a B12 injection prior to the chemotherapy. You can have this done at Dr. Sharyon Medicus office on 12/26, B12 inj 1000 mcg IM  We will plan on doing restaging PET scan in March 2014  Pemetrexed injection What is this medicine? PEMETREXED (PEM e TREX ed) is a chemotherapy drug. This medicine affects cells that are rapidly growing, such as cancer cells and cells in your mouth and stomach. It is usually used to treat lung cancers like non-small cell lung cancer and mesothelioma. It may also be used to treat other cancers. This medicine may be used for other purposes; ask your health care provider or pharmacist if you have questions. What should I tell my health care provider before I take this medicine? They need to know if you have any of these conditions: -if you frequently drink alcohol containing beverages -infection (especially a virus infection such as chickenpox, cold sores, or herpes) -kidney disease -liver disease -low blood counts, like low platelets, red bloods, or white blood cells -an unusual or allergic reaction to pemetrexed, mannitol, other medicines, foods, dyes, or preservatives -pregnant or trying to get pregnant -breast-feeding How should I use this medicine? This drug is given as an infusion into a vein. It is administered in a hospital or clinic by a specially trained health care professional. Talk to your pediatrician regarding the use of this medicine in children. Special care may be needed. Overdosage: If you think you have taken too much of this medicine contact a poison control center or emergency room at once. NOTE: This medicine is only for you. Do not share this medicine with others. What if I miss a dose? It is important not to miss your dose. Call your doctor or health care professional if you are unable to keep an appointment. What may interact with this  medicine? -aspirin and aspirin-like medicines -medicines to increase blood counts like filgrastim, pegfilgrastim, sargramostim -methotrexate -NSAIDS, medicines for pain and inflammation, like ibuprofen or naproxen -probenecid -pyrimethamine -vaccines Talk to your doctor or health care professional before taking any of these medicines: -acetaminophen -aspirin -ibuprofen -ketoprofen -naproxen This list may not describe all possible interactions. Give your health care provider a list of all the medicines, herbs, non-prescription drugs, or dietary supplements you use. Also tell them if you smoke, drink alcohol, or use illegal drugs. Some items may interact with your medicine. What should I watch for while using this medicine? Visit your doctor for checks on your progress. This drug may make you feel generally unwell. This is not uncommon, as chemotherapy can affect healthy cells as well as cancer cells. Report any side effects. Continue your course of treatment even though you feel ill unless your doctor tells you to stop. In some cases, you may be given additional medicines to help with side effects. Follow all directions for their use. Call your doctor or health care professional for advice if you get a fever, chills or sore throat, or other symptoms of a cold or flu. Do not treat yourself. This drug decreases your body's ability to fight infections. Try to avoid being around people who are sick. This medicine may increase your risk to bruise or bleed. Call your doctor or health care professional if you notice any unusual bleeding. Be careful brushing and flossing your teeth or using a toothpick because you may get an infection or bleed more easily. If you have any dental  work done, tell your dentist you are receiving this medicine. Avoid taking products that contain aspirin, acetaminophen, ibuprofen, naproxen, or ketoprofen unless instructed by your doctor. These medicines may hide a fever. Call  your doctor or health care professional if you get diarrhea or mouth sores. Do not treat yourself. To protect your kidneys, drink water or other fluids as directed while you are taking this medicine. Men and women must use effective birth control while taking this medicine. You may also need to continue using effective birth control for a time after stopping this medicine. Do not become pregnant while taking this medicine. Tell your doctor right away if you think that you or your partner might be pregnant. There is a potential for serious side effects to an unborn child. Talk to your health care professional or pharmacist for more information. Do not breast-feed an infant while taking this medicine. This medicine may lower sperm counts. What side effects may I notice from receiving this medicine? Side effects that you should report to your doctor or health care professional as soon as possible: -allergic reactions like skin rash, itching or hives, swelling of the face, lips, or tongue -low blood counts - this medicine may decrease the number of white blood cells, red blood cells and platelets. You may be at increased risk for infections and bleeding. -signs of infection - fever or chills, cough, sore throat, pain or difficulty passing urine -signs of decreased platelets or bleeding - bruising, pinpoint red spots on the skin, black, tarry stools, blood in the urine -signs of decreased red blood cells - unusually weak or tired, fainting spells, lightheadedness -breathing problems, like a dry cough -changes in emotions or moods -chest pain -confusion -diarrhea -high blood pressure -mouth or throat sores or ulcers -pain, swelling, warmth in the leg -pain on swallowing -swelling of the ankles, feet, hands -trouble passing urine or change in the amount of urine -vomiting -yellowing of the eyes or skin Side effects that usually do not require medical attention (report to your doctor or health care  professional if they continue or are bothersome): -hair loss -loss of appetite -nausea -stomach upset This list may not describe all possible side effects. Call your doctor for medical advice about side effects. You may report side effects to FDA at 1-800-FDA-1088. Where should I keep my medicine? This drug is given in a hospital or clinic and will not be stored at home. NOTE: This sheet is a summary. It may not cover all possible information. If you have questions about this medicine, talk to your doctor, pharmacist, or health care provider.  2012, [employer]. (10/24/2007 1:24:03 PM)

## 2012-03-03 NOTE — Telephone Encounter (Signed)
see LA every 21 days begin on 04/06/12 coordinate with chemotherapy x 6 appointments  Made patient appointment starting at 04-06-2012 every 21 days   Sent michelle email to set up patient's appointment

## 2012-03-03 NOTE — Progress Notes (Signed)
OFFICE PROGRESS NOTE  CC  [name], MD [address] [place] [address] Dr. Shirlean Kelly Dr. Dorothy Puffer Dr. [name]  DIAGNOSIS: 56 year old female with metastatic adenocarcinoma with brain metastasis Unknown primary but possibly an upper GI source versus lung primary  PRIOR THERAPY:  #1 patient underwent resection on 6/12 of brain met with the pathology showing a adenocarcinoma likely upper GI primary. Patient has also had SRS on 10/06/2011.  #2 patient has had multiple staging studies including PET CT which showed a 7 mm nodule in the right upper lobe which was not seen previously on CT of the chest. She subsequently was seen by Dr. Craige Cotta in pulmonary for possible bronchoscopy/biopsy.  #3 patient also has had consultation with Dr. [name] she has undergone endoscopy colonoscopy without any evidence of a primary. She has undergone capsule endoscopy results of which are pending. Patient also has had MRCP and that was negative.  #4 Patient to begin chemotherapy pending results of the capsule endoscopy. If the capsule endoscopy is negative she will begin chemotherapy consisting of Taxol carboplatinum given every 21 days for a total of 6 cycles. 6 cycle given on 11/11/11 through 02/25/2012.  #5 patient will begin maintenance chemotherapy consisting of Alimta every 21 days beginning 04/06/2012  CURRENT THERAPY: Here for followup labs and office visit  INTERVAL HISTORY: Haley Lowe 56 y.o. female returns for Followup visit today. Overall patient is doing well she still continues to have neuropathy but it is stable. She is weak tired and fatigued but this usually improves by week #2 or 3 after chemotherapy. She is denying any nausea vomiting fevers chills night sweats. Her INR is therapeutic at 2.3 this was performed at Dr. Sharyon Medicus office. She is currently on Coumadin 6 mg on a daily basis and this seems to be her dose. She has no abdominal pain no  diarrhea or constipation no headaches or double vision. Remainder of the 10 point review of systems is negative.  MEDICAL HISTORY: Past Medical History  Diagnosis Date  . Hypertension   . PONV (postoperative nausea and vomiting)   . Anxiety   . Depression   . COPD (chronic obstructive pulmonary disease)   . GERD (gastroesophageal reflux disease)   . Headache   . Arthritis   . Adenocarcinoma 09/28/2011  . Hx of adenomatous colonic polyps   . Pulmonary nodules   . Diabetes mellitus   . Emphysema   . 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 Allergy     hips and shoulders  . Cataract     1 lens implaqnt right eye,intact on left eye  . Goiter     nodule watching  . Fibromyalgia     neuropathy tops of both feet  . Metastasis to brain 09/15/11     left frontoparietal region  . History of radiation therapy 10/06/11    1 fraction  15 gray  . Pulmonary emboli 11/04/11    right upper lobe and r lower lobe PE  . Lung nodule seen on imaging study 11/04/11 CT    4mm LLL  . DVT (deep venous thrombosis) 12/09/2011  . Pulmonary embolism 12/09/2011    ALLERGIES:  is allergic to penicillins and codeine.  MEDICATIONS:  Present medications are reviewed and updated  SURGICAL HISTORY:  Past Surgical History  Procedure Date  . Abdominal hysterectomy 1996  . Cholecystectomy 2000  . Shoulder surgery 1998    right  .  Eye surgery 2001    vision correction  . Foot surgery 2005    left  . Craniotomy 09/15/2011    Procedure: CRANIOTOMY TUMOR EXCISION;  Surgeon: [name], MD;  Location: [facility];  Service: Neurosurgery;  Laterality: N/A;  Craniotomy resection of tumor with stealth  . Cataract extraction 2011    with lens implant   REVIEW OF SYSTEMS: General: fatigue (-), night sweats (-), fever (-), pain (-) Lymph: palpable nodes (-) HEENT: vision changes (-), mucositis (-), gum bleeding (-), epistaxis (-) Cardiovascular: chest pain (-), palpitations  (-) Pulmonary: shortness of breath (-), dyspnea on exertion (-), cough (-), hemoptysis (-) GI:  Early satiety (-), melena (-), dysphagia (-), nausea/vomiting (-), diarrhea (-) GU: dysuria (-), hematuria (-), incontinence (-) Musculoskeletal: joint swelling (-), joint pain (-), back pain (-) Neuro: weakness (-), numbness (-), headache (-), confusion (-) Skin: Rash (-), lesions (-), dryness (-) Psych: depression (-), suicidal/homicidal ideation (-), feeling of hopelessness (-)  PHYSICAL EXAM:  BP 106/72  Pulse 80  Temp 98.4 F (36.9 C) (Oral)  Resp 20  Ht 5\' 2"  (1.575 m)  Wt 185 lb 8 oz (84.142 kg)  BMI 33.93 kg/m2 General: Patient is a well appearing female in no acute distress HEENT: PERRLA, sclerae anicteric no conjunctival pallor, MMM Neck: supple, no palpable adenopathy Lungs: clear to auscultation bilaterally, no wheezes, rhonchi, or rales Cardiovascular: regular rate rhythm, S1, S2, no murmurs, rubs or gallops Abdomen: Soft, non-tender, non-distended, normoactive bowel sounds, no HSM Extremities: warm and well perfused, no clubbing, cyanosis, or edema Skin: No rashes or lesions Neuro: Non-focal ECOG PERFORMANCE STATUS: 1 - Symptomatic but completely ambulatory  LABORATORY DATA: Lab Results  Component Value Date   WBC 14.4* 03/03/2012   HGB 11.8 03/03/2012   HCT 35.1 03/03/2012   MCV 97.2 03/03/2012   PLT 161 03/03/2012      Chemistry      Component Value Date/Time   NA 137 02/25/2012 0938   NA 138 11/18/2011 1153   K 3.8 02/25/2012 0938   K 3.6 11/18/2011 1153   CL 101 02/25/2012 0938   CL 100 11/18/2011 1153   CO2 29 02/25/2012 0938   CO2 27 11/18/2011 1153   BUN 11.0 02/25/2012 0938   BUN 14 11/18/2011 1153   CREATININE 0.7 02/25/2012 0938   CREATININE 0.65 11/18/2011 1153      Component Value Date/Time   CALCIUM 9.5 02/25/2012 0938   CALCIUM 8.8 11/18/2011 1153   ALKPHOS 91 02/25/2012 0938   ALKPHOS 112 11/18/2011 1153   AST 27 02/25/2012 0938   AST 13  11/18/2011 1153   ALT 58* 02/25/2012 0938   ALT 18 11/18/2011 1153   BILITOT 0.22 02/25/2012 0938   BILITOT 0.3 11/18/2011 1153     RADIOGRAPHIC STUDIES:  ASSESSMENT: 56 year old female with  #1 metastatic adenocarcinoma of unknown primary with brain metastasis. Patient is now status post  SRS. Post procedure she is doing quite well.  #2 we have performed extensive studies to try to elucidate the primary for at the adenocarcinoma unfortunately up-to-date all scans mammograms and procedures have been negative. Patient's PET scan did show a 7 millimeter nodule that had hypermetabolic activity concerning for a lung primary in this smoker.Further evaluation will be performed by pulmonary.  #3 Patient is pulmonary embolism as well as left lower extremity DVT. She had a [place] filter placed just recently. Patient's INR is therapeutic and I have recommended that she discontinue the Lovenox and continue  the Coumadin at 5 mg daily. She will have her INRs checked at Dr. Sharyon Medicus office and have the results faxed to [place].  #4 patient is status post cycle 5 of her chemotherapy. After her fourth cycle we did get CT scans and a PET scan. Which showed resolution of the 7 mm nodule in the lung. She was also seen by Dr. Shirline Frees who has recommended continuation of this chemotherapy as well as possibly going on to maintenance treatment.  #5 patient has now completed 6 cycles of Taxol and carboplatinum. Overall she tolerated it well except for some neuropathy.  #6 in January 2014 she will proceed with maintenance Alimta every 21 days. We discussed risks and benefits of this. We also discussed the need for her to begin folic acid as well as B12 injections. Her first B12 injection will be given at Dr. Sharyon Medicus office since you will need to be on 03/30/2012 in order for [place] to begin her chemotherapy on 04/06/2012.  PLAN:  #1 patient will proceed with Alimta every 21 days beginning 04/06/2012.  #2 we will plan on  doing restaging PET scan in March 2014.  All questions were answered. The patient knows to call the clinic with any problems, questions or concerns. We can certainly see the patient much sooner if necessary.  I spent 25 minutes counseling the patient face to face. The total time spent in the appointment was 30 minutes.  Drue Second, MD Medical/Oncology [facility] [phone] (beeper) [phone] (Office)  03/03/2012, 11:02 AM

## 2012-03-03 NOTE — Telephone Encounter (Signed)
Per staff message I have scheduled appts. JMW  

## 2012-03-08 ENCOUNTER — Telehealth: Payer: Self-pay | Admitting: Oncology

## 2012-03-08 NOTE — Telephone Encounter (Signed)
Cancelled this pt's jan and feb 2014 appts per dr Milta Deiters email due to she is now a pt of dr Mariel Sleet in [place].

## 2012-03-09 ENCOUNTER — Encounter: Payer: Self-pay | Admitting: *Deleted

## 2012-04-06 ENCOUNTER — Other Ambulatory Visit: Payer: BC Managed Care – PPO | Admitting: Lab

## 2012-04-06 ENCOUNTER — Ambulatory Visit: Payer: BC Managed Care – PPO

## 2012-04-06 ENCOUNTER — Ambulatory Visit: Payer: BC Managed Care – PPO | Admitting: Adult Health

## 2012-04-07 ENCOUNTER — Encounter (HOSPITAL_COMMUNITY): Payer: BC Managed Care – PPO | Attending: Oncology | Admitting: Oncology

## 2012-04-07 VITALS — BP 121/78 | HR 72 | Temp 97.9°F | Resp 18 | Wt 185.8 lb

## 2012-04-07 DIAGNOSIS — C7949 Secondary malignant neoplasm of other parts of nervous system: Secondary | ICD-10-CM | POA: Insufficient documentation

## 2012-04-07 DIAGNOSIS — M545 Low back pain: Secondary | ICD-10-CM

## 2012-04-07 DIAGNOSIS — C7931 Secondary malignant neoplasm of brain: Secondary | ICD-10-CM | POA: Insufficient documentation

## 2012-04-07 DIAGNOSIS — C801 Malignant (primary) neoplasm, unspecified: Secondary | ICD-10-CM

## 2012-04-07 DIAGNOSIS — M25559 Pain in unspecified hip: Secondary | ICD-10-CM

## 2012-04-07 LAB — COMPREHENSIVE METABOLIC PANEL
Alkaline Phosphatase: 72 U/L (ref 39–117)
BUN: 14 mg/dL (ref 6–23)
CO2: 29 mEq/L (ref 19–32)
Chloride: 96 mEq/L (ref 96–112)
GFR calc Af Amer: >90 mL/min (ref >90)
GFR calc non Af Amer: >90 mL/min (ref >90)
Glucose, Bld: 90 mg/dL (ref 70–99)
Potassium: 4 mEq/L (ref 3.5–5.1)
Total Bilirubin: 0.2 mg/dL — ABNORMAL LOW (ref 0.3–1.2)
Total Protein: 7.4 g/dL (ref 6.0–8.3)

## 2012-04-07 LAB — CBC WITH DIFFERENTIAL/PLATELET
Basophils Absolute: 0 10*3/uL (ref 0.0–0.1)
Basophils Relative: 1 % (ref 0–1)
Lymphocytes Relative: 39 % (ref 12–46)
Neutro Abs: 2.1 10*3/uL (ref 1.7–7.7)
Platelets: 187 10*3/uL (ref 150–400)
RDW: 12.9 % (ref 11.5–15.5)
WBC: 4.3 10*3/uL (ref 4.0–10.5)

## 2012-04-07 NOTE — Progress Notes (Signed)
Problem #1 metastatic adenocarcinoma of the brain status post surgical resection from either an upper GI primary or lung. She is status post chemotherapy with carboplatinum and Taxol x6 cycles with a negative PET scan as of 01/06/2012. No upper GI primary in spite of endoscopy, colonoscopy etc. was found. She had no evidence of a breast primary either. She is here today since she lives close by and would like to receive her care closer to home. It has been recommended to her to take switch maintenance pemetrexed therapy. She is here to discuss that since she is reluctant to try it due to significant toxicity she had with other agents. She is of course status post surgery on her brain followed by radiation therapy by Dr. Mitzi Hansen. She is scheduled for repeat MRI of her brain later this month. Her oncology review of systems reveals that for the last 4 weeks she has had right hip discomfort/low back discomfort which occasionally radiates down to the upper thigh. She has had similar pain in the past she states but nothing as severe as this. She has had good relief in the past from anti-inflammatories which she has been cautioned not to take. She gets a little if any relief from her other pain medications or muscle relaxants.  Appetite is too good she has gained approximately 20+ pounds since starting on steroids anti-nausea properties. She denies headaches, seizures, etc. Bowels are working quite well. She passes urine well. She did have an ear infection was treated with clindamycin orally. It was also treated with Keflex which helped. She is in no pain except from the right hip and low back. Vital signs are recorded. She has no lymphadenopathy. Lungs are clear. Breast exam is negative for masses. Port-A-Cath is intact. Heart shows a regular rhythm and rate without murmur rub or gallop. Abdomen is soft obese but nontender and without organomegaly. She has no peripheral edema the arms or legs. She is alert and oriented.  She has a right eye changes which show cataract surgery in the past. The left eye is unremarkable. Facial symmetry is intact. She cannot pronate her right hand completely compared to the left. She states this is how she presented with right hand and arm pain in June of 2013. Strength in the right hand grips is minimally diminished compared to the  left. She had many many questions as did her 2 daughters and husband who accompanied her about switch maintenance chemotherapy. My own bias is to treat her for 6 cycles if she so desires with  pemetrexed as recommended but not necessarily more than that since overall survival has not been impacted by this regimen. It may make a difference for a few months with progression free survival only.  She may desire to have a break in therapy, namely a drug vacation or drug holiday. I do think we need to investigate her low back and hips with MRIs. The pain started more than a month after her last PET scan. I will see her in one week and we will decide what she wants to do by then. She knows that the pemetrexed is a recommendation and not an absolute mandatory therapy. She may want to wait till after her MRI of her brain at the end of January potentially.

## 2012-04-07 NOTE — Progress Notes (Signed)
Haley Lowe presented for labwork. Labs per MD order drawn via Peripheral Line 23 gauge needle inserted in left hand.3  Good blood return present. Procedure without incident.  Needle removed intact. Patient tolerated procedure well.

## 2012-04-07 NOTE — Patient Instructions (Addendum)
[facility] Discharge Instructions  RECOMMENDATIONS MADE BY THE CONSULTANT AND ANY TEST RESULTS WILL BE SENT TO YOUR REFERRING PHYSICIAN.  EXAM FINDINGS BY THE PHYSICIAN TODAY AND SIGNS OR SYMPTOMS TO REPORT TO CLINIC OR PRIMARY PHYSICIAN: Exam and discussion by MD.  Haley Lowe have had definitive therapy for your cancer.   MEDICATIONS PRESCRIBED:  none     SPECIAL INSTRUCTIONS/FOLLOW-UP: Will do scans of your lumbar spine and pelvis on Monday and see you back in follow-up on Friday 04/14/12.**  Thank you for choosing [facility] to provide your oncology and hematology care.  To afford each patient quality time with our providers, please arrive at least 15 minutes before your scheduled appointment time.  With your help, our goal is to use those 15 minutes to complete the necessary work-up to ensure our physicians have the information they need to help with your evaluation and healthcare recommendations.    Effective January 1st, 2014, we ask that you re-schedule your appointment with our physicians should you arrive 10 or more minutes late for your appointment.  We strive to give you quality time with our providers, and arriving late affects you and other patients whose appointments are after yours.    Again, thank you for choosing [facility].  Our hope is that these requests will decrease the amount of time that you wait before being seen by our physicians.       _____________________________________________________________  I acknowledge that I have been informed and understand all the instructions given to me and received a copy. I do not have anymore questions at this time but understand that I may call the Cancer Center at [facility] at [phone] during business hours should I have any further questions or need assistance in obtaining follow-up care.

## 2012-04-10 ENCOUNTER — Ambulatory Visit (HOSPITAL_COMMUNITY)
Admission: RE | Admit: 2012-04-10 | Discharge: 2012-04-10 | Disposition: A | Discharge status: Home / Self Care | Payer: BC Managed Care – PPO | Source: Ambulatory Visit | Attending: Oncology | Admitting: Oncology

## 2012-04-10 ENCOUNTER — Ambulatory Visit (HOSPITAL_COMMUNITY): Admission: RE | Admit: 2012-04-10 | Payer: BC Managed Care – PPO | Source: Ambulatory Visit

## 2012-04-10 DIAGNOSIS — C7931 Secondary malignant neoplasm of brain: Secondary | ICD-10-CM

## 2012-04-10 DIAGNOSIS — M5137 Other intervertebral disc degeneration, lumbosacral region: Secondary | ICD-10-CM | POA: Insufficient documentation

## 2012-04-10 DIAGNOSIS — C7800 Secondary malignant neoplasm of unspecified lung: Secondary | ICD-10-CM | POA: Insufficient documentation

## 2012-04-10 DIAGNOSIS — M5126 Other intervertebral disc displacement, lumbar region: Secondary | ICD-10-CM | POA: Insufficient documentation

## 2012-04-10 DIAGNOSIS — M51379 Other intervertebral disc degeneration, lumbosacral region without mention of lumbar back pain or lower extremity pain: Secondary | ICD-10-CM | POA: Insufficient documentation

## 2012-04-10 DIAGNOSIS — C801 Malignant (primary) neoplasm, unspecified: Secondary | ICD-10-CM | POA: Insufficient documentation

## 2012-04-10 DIAGNOSIS — M25559 Pain in unspecified hip: Secondary | ICD-10-CM | POA: Insufficient documentation

## 2012-04-10 DIAGNOSIS — C7949 Secondary malignant neoplasm of other parts of nervous system: Secondary | ICD-10-CM | POA: Insufficient documentation

## 2012-04-14 ENCOUNTER — Encounter (HOSPITAL_BASED_OUTPATIENT_CLINIC_OR_DEPARTMENT_OTHER): Payer: BC Managed Care – PPO | Admitting: Oncology

## 2012-04-14 ENCOUNTER — Encounter (HOSPITAL_COMMUNITY): Payer: BC Managed Care – PPO

## 2012-04-14 VITALS — BP 91/61 | HR 78 | Temp 98.7°F | Resp 18 | Wt 186.0 lb

## 2012-04-14 DIAGNOSIS — C801 Malignant (primary) neoplasm, unspecified: Secondary | ICD-10-CM

## 2012-04-14 DIAGNOSIS — C7931 Secondary malignant neoplasm of brain: Secondary | ICD-10-CM

## 2012-04-14 MED ORDER — HEPARIN SOD (PORK) LOCK FLUSH 100 UNIT/ML IV SOLN
500.0000 [IU] | Freq: Once | INTRAVENOUS | Status: AC
  Administered 2012-04-14: 500 [IU] via INTRAVENOUS
  Filled 2012-04-14: qty 5

## 2012-04-14 MED ORDER — HEPARIN SOD (PORK) LOCK FLUSH 100 UNIT/ML IV SOLN
INTRAVENOUS | Status: AC
  Filled 2012-04-14: qty 5

## 2012-04-14 MED ORDER — SODIUM CHLORIDE 0.9 % IJ SOLN
10.0000 mL | INTRAMUSCULAR | Status: DC | PRN
  Administered 2012-04-14: 10 mL via INTRAVENOUS
  Filled 2012-04-14: qty 10

## 2012-04-14 NOTE — Progress Notes (Signed)
Kandee Keen presented for MD visit and Portacath access and flush.  Proper placement of portacath confirmed by CXR.  Portacath located right chest wall accessed with  H 20 needle.  Good blood return present. Portacath flushed with 20ml NS and 500U/22ml Heparin and needle removed intact.  Procedure tolerated well and without incident.

## 2012-04-14 NOTE — Progress Notes (Signed)
Problem #1 metastatic cancer to brain, adenocarcinoma tied consistent with lung primary status post chemotherapy of carboplatin and Taxol x6 cycles as well as radiation postoperatively after brain surgery. Presently she is doing very well. 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 Her hip joints looked intact. We are therefore going to give her Motrin which she has at home one to 3 times a day on a when necessary basis but if she uses it for more than 3-5 days at a time she needs to take a break in therapy unless she uses it just at nighttime to help her sleep. She is having difficulty sleeping predominantly.  The standpoint of her maintenance chemotherapy she has opted not to pursue pemetrexed at this time. Since it does not promote overall survival advantages she would like to have a break and therapy and try feels well as possible. She already has a scan of her brain scheduled for later in January and we will see what that shows before making a decision about reevaluating her systemic disease process. I suspect, Haley Lowe we will need to see what the PET scan shows. I will not schedule that until we see what the MRI of her brain shows.  She'll see Haley Lowe and her neurosurgeon after the MRI.  I will see her in 2 months sooner if need be. She was accompanied by her husband and her daughter Haley Lowe. She is very happy with her decision not to pursue this switch maintenance regimen with pemetrexed.

## 2012-04-14 NOTE — Patient Instructions (Signed)
[facility] Discharge Instructions  RECOMMENDATIONS MADE BY THE CONSULTANT AND ANY TEST RESULTS WILL BE SENT TO YOUR REFERRING PHYSICIAN.  EXAM FINDINGS BY THE PHYSICIAN TODAY AND SIGNS OR SYMPTOMS TO REPORT TO CLINIC OR PRIMARY PHYSICIAN: Exam findings as discussed by Dr. Mariel Sleet.  MEDICATIONS PRESCRIBED:  1.  No prescription.  Begin taking Motrin 800mg  three times daily (with meals) through the weekend.  Please call us Monday to let us know how you pain did and how your quality sleep was.  INSTRUCTIONS GIVEN AND DISCUSSED: 1.  Call us after your brain MRI on 04/28/12. 2.  We will see you again in 2 months.  SPECIAL INSTRUCTIONS/FOLLOW-UP: n/a  Thank you for choosing [facility] to provide your oncology and hematology care.  To afford each patient quality time with our providers, please arrive at least 15 minutes before your scheduled appointment time.  With your help, our goal is to use those 15 minutes to complete the necessary work-up to ensure our physicians have the information they need to help with your evaluation and healthcare recommendations.    Effective January 1st, 2014, we ask that you re-schedule your appointment with our physicians should you arrive 10 or more minutes late for your appointment.  We strive to give you quality time with our providers, and arriving late affects you and other patients whose appointments are after yours.    Again, thank you for choosing [facility].  Our hope is that these requests will decrease the amount of time that you wait before being seen by our physicians.       _____________________________________________________________  Should you have questions after your visit to [facility], please contact our office at [phone] between the hours of 8:30 a.m. and 5:00 p.m.  Voicemails left after 4:30 p.m. will not be returned until the following business day.  For prescription  refill requests, have your pharmacy contact our office with your prescription refill request.

## 2012-04-17 ENCOUNTER — Other Ambulatory Visit (HOSPITAL_COMMUNITY): Payer: Self-pay

## 2012-04-17 ENCOUNTER — Telehealth (HOSPITAL_COMMUNITY): Payer: Self-pay

## 2012-04-17 DIAGNOSIS — R399 Unspecified symptoms and signs involving the genitourinary system: Secondary | ICD-10-CM

## 2012-04-17 NOTE — Telephone Encounter (Signed)
Unable to reach by phone.  Message left for patient to call back.

## 2012-04-17 NOTE — Telephone Encounter (Signed)
Spoke with daughter and plans are for Haley Lowe to come in tomorrow for U/A.

## 2012-04-17 NOTE — Telephone Encounter (Signed)
Message copied by Evelena Leyden on Mon Apr 17, 2012  4:59 PM ------      Message from: Mariel Sleet, ERIC S      Created: Mon Apr 17, 2012  4:38 PM       Unlikely to be folic acid affecting urine      May need to repeat U/A if persistent symptoms-would do here.

## 2012-04-17 NOTE — Telephone Encounter (Signed)
Call from patient and stated that the Ibuprofen that was prescribed has helped her discomfort.  Also wants to know if the folic acid that she is taking could cause her urine to be "more yellow" and have a strong odor?  Does complain of some discomfort and burning with urination and said she had these symptoms recently and went to PCP and urine was checked and cultured but tests were negative.

## 2012-04-18 ENCOUNTER — Encounter (HOSPITAL_COMMUNITY): Payer: BC Managed Care – PPO

## 2012-04-18 DIAGNOSIS — R399 Unspecified symptoms and signs involving the genitourinary system: Secondary | ICD-10-CM

## 2012-04-18 LAB — URINALYSIS, ROUTINE W REFLEX MICROSCOPIC
Glucose, UA: NEGATIVE mg/dL
Leukocytes, UA: NEGATIVE
Nitrite: NEGATIVE
Specific Gravity, Urine: <1.005 — ABNORMAL LOW (ref 1.005–1.030)
pH: 5.5 (ref 5.0–8.0)

## 2012-04-18 NOTE — Progress Notes (Signed)
Patient collected ua and culture

## 2012-04-19 LAB — URINE CULTURE

## 2012-04-27 ENCOUNTER — Ambulatory Visit: Payer: BC Managed Care – PPO

## 2012-04-27 ENCOUNTER — Other Ambulatory Visit: Payer: BC Managed Care – PPO | Admitting: Lab

## 2012-04-27 ENCOUNTER — Ambulatory Visit: Payer: BC Managed Care – PPO | Admitting: Adult Health

## 2012-04-28 ENCOUNTER — Telehealth (HOSPITAL_COMMUNITY): Payer: Self-pay

## 2012-04-28 ENCOUNTER — Ambulatory Visit
Admission: RE | Admit: 2012-04-28 | Discharge: 2012-04-28 | Disposition: A | Discharge status: Home / Self Care | Payer: BC Managed Care – PPO | Source: Ambulatory Visit | Attending: Radiation Oncology | Admitting: Radiation Oncology

## 2012-04-28 DIAGNOSIS — C7931 Secondary malignant neoplasm of brain: Secondary | ICD-10-CM

## 2012-04-28 MED ORDER — GADOBENATE DIMEGLUMINE 529 MG/ML IV SOLN
17.0000 mL | Freq: Once | INTRAVENOUS | Status: AC | PRN
  Administered 2012-04-28: 17 mL via INTRAVENOUS

## 2012-04-28 NOTE — Telephone Encounter (Signed)
Has completed her MRI of brain and wants to know if MD has seen the results.

## 2012-05-01 ENCOUNTER — Encounter: Payer: Self-pay | Admitting: Radiation Oncology

## 2012-05-01 ENCOUNTER — Ambulatory Visit
Admission: RE | Admit: 2012-05-01 | Discharge: 2012-05-01 | Disposition: A | Discharge status: Home / Self Care | Payer: BC Managed Care – PPO | Source: Ambulatory Visit | Attending: Radiation Oncology | Admitting: Radiation Oncology

## 2012-05-01 VITALS — BP 121/73 | HR 70 | Temp 98.0°F | Wt 187.1 lb

## 2012-05-01 DIAGNOSIS — C7931 Secondary malignant neoplasm of brain: Secondary | ICD-10-CM

## 2012-05-01 NOTE — Progress Notes (Signed)
Patient here for routine follow up completion of radiation for 1 fraction to left frontoparietal region.Denies pain, nausea.To review mri report.

## 2012-05-01 NOTE — Progress Notes (Signed)
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  The patient returns today to review her recent brain MRI scan. She indicates that she is doing very well at this time. She denies any significant headaches or nausea. She has had some occasional vision changes, none being persistent. She does plan to see an ophthalmologist regarding this. She is continuing management through Dr. Mariel Lowe with regards to systemic treatment and she is done very well with this.                              ALLERGIES:  is allergic to penicillins and codeine.  Meds: Current Outpatient Prescriptions  Medication Sig Dispense Refill  . albuterol (PROVENTIL) (2.5 MG/3ML) 0.083% nebulizer solution Take 2.5 mg by nebulization every 6 (six) hours as needed. For shortness of breath      . ALPRAZolam (XANAX) 0.5 MG tablet Take 0.5 mg by mouth 3 (three) times daily as needed.       . Cholecalciferol (VITAMIN D-3 PO) Take 2,000 Units by mouth daily.      Marland Kitchen CLODERM 0.1 % cream Apply 1 application topically as needed. For scalp      . divalproex (DEPAKOTE) 500 MG DR tablet Take 500 mg by mouth 2 (two) times daily.       . DULoxetine (CYMBALTA) 30 MG capsule Take 30 mg by mouth daily.      Marland Kitchen esomeprazole (NEXIUM) 40 MG capsule Take 40 mg by mouth daily.       . folic acid (FOLVITE) 1 MG tablet Take 1 tablet (1 mg total) by mouth daily. Take daily starting 5-7 days before Alimta chemotherapy. Continue until 21 days after Alimta completed.  100 tablet  3  . gabapentin (NEURONTIN) 100 MG capsule Take 2 capsules at bedtime  60 capsule  6  . ibuprofen (ADVIL,MOTRIN) 800  MG tablet Take 800 mg by mouth every 8 (eight) hours as needed.      . lidocaine-prilocaine (EMLA) cream Apply topically as needed.  30 g  6  . LORazepam (ATIVAN) 1 MG tablet Take 1 mg by mouth every 8 (eight) hours as needed.      . metoprolol tartrate (LOPRESSOR) 25 MG tablet Take 25 mg by mouth 2 (two) times daily.      Marland Kitchen olmesartan-hydrochlorothiazide (BENICAR HCT) 20-12.5 MG per tablet Take 0.25 tablets by mouth every morning. 1/4 tablet      . polyethylene glycol (MIRALAX / GLYCOLAX) packet Take 17 g by mouth daily. For constipation      . Simethicone (GAS-X PO) Take 1 tablet by mouth as needed. 1-2 prn      . simvastatin (ZOCOR) 10 MG tablet Take 10 mg by mouth at bedtime.      Marland Kitchen warfarin (COUMADIN) 5 MG tablet Take 5 mg by mouth daily. 6mg  daily current dose as of 04/07/12      . ondansetron (ZOFRAN) 8 MG tablet Take 1 tablet (8 mg total) by mouth 2 (two) times daily. Take two times a day starting the day after chemotherapy for 2 days. Then take  two times a day as needed for nausea and vomiting.  30 tablet  1  . prochlorperazine (COMPAZINE) 10 MG tablet Take 1 tablet (10 mg total) by mouth every 6 (six) hours as needed (Nausea or vomiting).  30 tablet  1  . prochlorperazine (COMPAZINE) 25 MG suppository Place 1 suppository (25 mg total) rectally every 12 (twelve) hours as needed for nausea.  12 suppository  3  . pseudoephedrine-guaifenesin (MUCINEX D) 60-600 MG per tablet Take 1 tablet by mouth every 12 (twelve) hours.        Physical Findings: The patient is in no acute distress. Patient is alert and oriented.  weight is 187 lb 1.6 oz (84.868 kg). Her temperature is 98 F (36.7 C). Her blood pressure is 121/73 and her pulse is 70. Her oxygen saturation is 98%. .   General: Well-developed, in no acute distress HEENT: Normocephalic, atraumatic Cardiovascular: Regular rate and rhythm Respiratory: Clear to auscultation bilaterally GI: Soft, nontender, normal bowel sounds Extremities: No  edema present   Lab Findings: Lab Results  Component Value Date   WBC 4.3 04/07/2012   HGB 13.4 04/07/2012   HCT 40.2 04/07/2012   MCV 95.7 04/07/2012   PLT 187 04/07/2012     Radiographic Findings: Mr Haley Lowe ZO Contrast  04/28/2012  *RADIOLOGY REPORT*  Clinical Data: 57 year old female with metastatic adenocarcinoma. Intracranial metastatic disease treated with surgery and radiation. Restaging.  MRI HEAD WITHOUT AND WITH CONTRAST  Technique:  Multiplanar, multiecho pulse sequences of the brain and surrounding structures were obtained according to standard protocol without and with intravenous contrast  Contrast: 17mL MULTIHANCE GADOBENATE DIMEGLUMINE 529 MG/ML IV SOLN  Comparison: 01/21/2012 and earlier.  Findings: Sequelae of left vertex craniotomy again noted. Underlying small cystic resection cavity with no associated postcontrast enhancement.  Minimal surrounding FLAIR hyperintensity is stable.  Thin rim of chronic blood products again noted.  No mass effect.   No abnormal enhancement identified in the brain today.  No other mass.  No areas of cerebral edema identified.  Stable gray and white matter signal elsewhere in the brain. Major intracranial vascular flow voids are stable. No evidence of cortically based acute infarction identified.  No ventriculomegaly. No acute intracranial hemorrhage identified.  Negative pituitary and cervicomedullary junction.  Negative visualized cervical spine. Bone marrow signal is stable within normal limits.  No acute scalp soft tissue findings.  Stable orbit soft tissues.  Stable minor paranasal sinus mucosal thickening.  Mastoids remain clear.  IMPRESSION: 1.  Satisfactory postoperative appearance of the brain.  Small resection cavity at the right superior frontal gyrus with no associated edema or enhancement. 2.  No new intracranial metastasis or abnormality identified.   Original Report Authenticated By: Haley Lowe, M.D.    Mr Lumbar Spine Wo Contrast  04/10/2012   *RADIOLOGY REPORT*  Clinical Data: Right hip pain.  Metastatic lung cancer.  MRI LUMBAR SPINE WITHOUT CONTRAST  Technique:  Multiplanar and multiecho pulse sequences of the lumbar spine were obtained without intravenous contrast.  Comparison: 07/14/2010  Findings: There is no abnormality at L2-3 or above.  The discs are unremarkable.  The canal and foramina are widely patent.  The distal cord and conus are normal with conus tip at L1.  There is mild curvature convex to the right with the apex at L3.  L3-4:  Minimal bulging of the disc.  No neural compression.  L4-5:  Bilateral facet degeneration with retrolisthesis of 2 mm. Circumferential bulging of the disc.  No stenosis  of the central canal.  Mild foraminal narrowing without apparent neural compression.  L5-S1:  Normal interspace.  No evidence of osseous metastatic disease.  No paravertebral mass identified.  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  IMPRESSION: No significant change.  Non compressive degenerative changes at L3- 4 and L4-5.  Facet degeneration at L4-5 could possibly cause hip region pain.   Original Report Authenticated By: Paulina Fusi, M.D.    Mr Pelvis Wo Contrast  04/11/2012  *RADIOLOGY REPORT*  Clinical Data: Right hip pain, metastatic adenocarcinoma  MRI PELVIS WITHOUT CONTRAST  Technique:  Multi-planar multi-sequence MR imaging of the pelvis was performed following the standard protocol. No intravenous contrast was administered.  Comparison: Lumbar spine MRI same date, head CT 01/06/2012  Findings: Ovaries are normal.  Prior hysterectomy.  Bladder is normal.  No pelvic free fluid or lymphadenopathy.  No visualized bowel abnormality allowing for technique.  Minimal marrow inhomogeneity but no focal abnormality to suggest osseous lesion. No marrow edema.  Sacroiliac joints are unremarkable.  No hip joint effusion.  Mild disc degenerative changes at L3-L4 and L4-L5 again  noted.  No soft tissue abnormality.  IMPRESSION: No acute intrapelvic abnormality.  No abnormality to specifically explain the history of right hip pain.   Original Report Authenticated By: Christiana Pellant, M.D.     Impression:    The patient's brain MRI scan was reviewed in brain conference this morning we are very pleased with this with no indication of recurrent/progressive disease and no new intracranial metastasis. Clinically she is doing well.  Plan:  Repeat brain MRI scan in 3 months.   Radene Gunning, M.D., Ph.D.

## 2012-05-02 ENCOUNTER — Other Ambulatory Visit: Payer: Self-pay | Admitting: Radiation Therapy

## 2012-05-02 DIAGNOSIS — C7931 Secondary malignant neoplasm of brain: Secondary | ICD-10-CM

## 2012-05-03 ENCOUNTER — Other Ambulatory Visit: Payer: Self-pay | Admitting: Obstetrics and Gynecology

## 2012-05-04 ENCOUNTER — Telehealth: Payer: Self-pay | Admitting: Emergency Medicine

## 2012-05-04 NOTE — Telephone Encounter (Signed)
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  TOLD FAYE TO CALL us WHEN PT AND/OR DR Sherwood Gambler WAS READY TO HAVE HER FILTER REMOVED AND WHEN HER COUMADIN IS REGULATED.

## 2012-05-08 ENCOUNTER — Encounter (HOSPITAL_COMMUNITY): Payer: Self-pay | Admitting: Oncology

## 2012-05-18 ENCOUNTER — Ambulatory Visit: Payer: BC Managed Care – PPO | Admitting: Adult Health

## 2012-05-18 ENCOUNTER — Other Ambulatory Visit: Payer: BC Managed Care – PPO | Admitting: Lab

## 2012-05-18 ENCOUNTER — Ambulatory Visit: Payer: BC Managed Care – PPO

## 2012-05-26 ENCOUNTER — Encounter (HOSPITAL_BASED_OUTPATIENT_CLINIC_OR_DEPARTMENT_OTHER): Payer: BC Managed Care – PPO | Admitting: Oncology

## 2012-05-26 ENCOUNTER — Encounter (HOSPITAL_COMMUNITY): Payer: BC Managed Care – PPO | Attending: Oncology

## 2012-05-26 DIAGNOSIS — C7931 Secondary malignant neoplasm of brain: Secondary | ICD-10-CM | POA: Insufficient documentation

## 2012-05-26 DIAGNOSIS — R5381 Other malaise: Secondary | ICD-10-CM | POA: Insufficient documentation

## 2012-05-26 DIAGNOSIS — C801 Malignant (primary) neoplasm, unspecified: Secondary | ICD-10-CM | POA: Insufficient documentation

## 2012-05-26 LAB — COMPREHENSIVE METABOLIC PANEL
AST: 25 U/L (ref 0–37)
Albumin: 3.8 g/dL (ref 3.5–5.2)
Alkaline Phosphatase: 75 U/L (ref 39–117)
Chloride: 99 mEq/L (ref 96–112)
Potassium: 3.9 mEq/L (ref 3.5–5.1)
Sodium: 138 mEq/L (ref 135–145)
Total Bilirubin: 0.2 mg/dL — ABNORMAL LOW (ref 0.3–1.2)

## 2012-05-26 LAB — CBC WITH DIFFERENTIAL/PLATELET
Basophils Absolute: 0 10*3/uL (ref 0.0–0.1)
Basophils Relative: 1 % (ref 0–1)
Hemoglobin: 12.9 g/dL (ref 12.0–15.0)
MCHC: 33.3 g/dL (ref 30.0–36.0)
Neutro Abs: 2.9 10*3/uL (ref 1.7–7.7)
Neutrophils Relative %: 56 % (ref 43–77)
Platelets: 185 10*3/uL (ref 150–400)
RDW: 12.7 % (ref 11.5–15.5)

## 2012-05-26 MED ORDER — HEPARIN SOD (PORK) LOCK FLUSH 100 UNIT/ML IV SOLN
500.0000 [IU] | Freq: Once | INTRAVENOUS | Status: AC
  Administered 2012-05-26: 500 [IU] via INTRAVENOUS
  Filled 2012-05-26: qty 5

## 2012-05-26 MED ORDER — SODIUM CHLORIDE 0.9 % IJ SOLN
10.0000 mL | INTRAMUSCULAR | Status: DC | PRN
  Administered 2012-05-26: 10 mL via INTRAVENOUS
  Filled 2012-05-26: qty 10

## 2012-05-26 MED ORDER — HEPARIN SOD (PORK) LOCK FLUSH 100 UNIT/ML IV SOLN
INTRAVENOUS | Status: AC
  Filled 2012-05-26: qty 5

## 2012-05-26 NOTE — Progress Notes (Signed)
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 She is status post chemotherapy consisting of carboplatinum and Taxol x6 cycles as well as radiation therapy postoperatively to the brain after her brain surgery. Her MRI in January was very stable.  She is feeling good but is worried about a couple things so is a work in today. She has supraclavicular fat pads which are very prominent consistent with steroid effect. She is not on steroids presently. She states that these "[place] pads" were somewhat present before she ever received chemotherapy or radiation therapy. They're just more prominent now.  Her vital signs are stable. She has no lymphadenopathy in the cervical, supraclavicular, infraclavicular, or axillary areas. Her lungs are very clear. Heart shows a regular rhythm and rate without murmur rub or gallop. She has no arm edema.  She looks very good overall is feeling better off chemotherapy. Her next MRI of her brain is in April and she needs a PET scan the same month for restaging.  We will get that scheduled but I believe the MRI should be done first.  Is still having trouble sleeping. She will try to Xanax namely 1 mg total dose, and if needed 0.5 mg 4 hours later. Thus far 1 Xanax nor 1 Ativan have helped her much.  Her appetite is excellent.  Will see her as scheduled in the future.

## 2012-05-26 NOTE — Patient Instructions (Addendum)
[facility] Discharge Instructions  RECOMMENDATIONS MADE BY THE CONSULTANT AND ANY TEST RESULTS WILL BE SENT TO YOUR REFERRING PHYSICIAN.  EXAM FINDINGS BY THE PHYSICIAN TODAY AND SIGNS OR SYMPTOMS TO REPORT TO CLINIC OR PRIMARY PHYSICIAN: Exam findings as discussed by Dr. Mariel Sleet.  The "swelling" to either sides of your neck just above your collar bones are fatty deposits, most likely due to the steroid medications you were taking during chemotherapy - nothing to be concerned about.    SPECIAL INSTRUCTIONS/FOLLOW-UP: 1.  For problems with sleep, you may take 2 Xanax (1mg  total) at bedtime and repeat with another Xanax tablet (0.5mg ) in 4 hours if you awake during sleep again. 2.  Increase your activity as tolerated. 3.  We have checked your thyroid levels today, due to your complaints of feeling tired throughout the day. 4.  Please contact Dr. Joellen Jersey office if you would like to reschedule your MRI appointment - it should be fine to have the MRI on the Wed/Thurs before good Friday.  Thank you for choosing [facility] to provide your oncology and hematology care.  To afford each patient quality time with our providers, please arrive at least 15 minutes before your scheduled appointment time.  With your help, our goal is to use those 15 minutes to complete the necessary work-up to ensure our physicians have the information they need to help with your evaluation and healthcare recommendations.    Effective January 1st, 2014, we ask that you re-schedule your appointment with our physicians should you arrive 10 or more minutes late for your appointment.  We strive to give you quality time with our providers, and arriving late affects you and other patients whose appointments are after yours.    Again, thank you for choosing [facility].  Our hope is that these requests will decrease the amount of time that you wait before being seen by our physicians.        _____________________________________________________________  Should you have questions after your visit to [facility] please contact our office at [phone] between the hours of 8:30 a.m. and 5:00 p.m.  Voicemails left after 4:30 p.m. will not be returned until the following business day.  For prescription refill requests, have your pharmacy contact our office with your prescription refill request.

## 2012-05-26 NOTE — Progress Notes (Signed)
Haley Lowe's reason for visit today is for MD visit and labs as scheduled per MD orders.  Venipuncture performed with a 23 gauge butterfly needle to R Antecubital.  Haley Lowe tolerated venipuncture well and without incident; questions were answered and patient was discharged.

## 2012-05-26 NOTE — Progress Notes (Signed)
Haley Lowe presented for Portacath access and flush.  Proper placement of portacath confirmed by CXR.  Portacath located right chest wall accessed with  H 20 needle.  Good blood return present. Portacath flushed with 20ml NS and 500U/64ml Heparin and needle removed intact.  Procedure tolerated well and without incident.  Haley Lowe reported swelling to bilateral sides of her neck - reports worsening since end of November.  Denies new lumps/bumps.  Dr. Mariel Sleet advised of same and ATISHA HAMIDI is to see MD today for eval of complaint.

## 2012-05-27 LAB — TSH: TSH: 1.388 u[IU]/mL (ref 0.350–4.500)

## 2012-06-12 ENCOUNTER — Ambulatory Visit (HOSPITAL_COMMUNITY): Payer: BC Managed Care – PPO | Admitting: Oncology

## 2012-06-15 ENCOUNTER — Other Ambulatory Visit (HOSPITAL_COMMUNITY): Payer: Self-pay | Admitting: Internal Medicine

## 2012-06-16 ENCOUNTER — Encounter (HOSPITAL_COMMUNITY): Payer: BC Managed Care – PPO | Attending: Oncology | Admitting: Oncology

## 2012-06-16 ENCOUNTER — Encounter (HOSPITAL_COMMUNITY): Payer: Self-pay | Admitting: Oncology

## 2012-06-16 VITALS — BP 106/71 | HR 70 | Temp 98.3°F | Resp 18 | Wt 187.2 lb

## 2012-06-16 DIAGNOSIS — I2699 Other pulmonary embolism without acute cor pulmonale: Secondary | ICD-10-CM

## 2012-06-16 DIAGNOSIS — C801 Malignant (primary) neoplasm, unspecified: Secondary | ICD-10-CM | POA: Insufficient documentation

## 2012-06-16 DIAGNOSIS — I82409 Acute embolism and thrombosis of unspecified deep veins of unspecified lower extremity: Secondary | ICD-10-CM

## 2012-06-16 DIAGNOSIS — C7931 Secondary malignant neoplasm of brain: Secondary | ICD-10-CM | POA: Insufficient documentation

## 2012-06-16 DIAGNOSIS — R5381 Other malaise: Secondary | ICD-10-CM | POA: Insufficient documentation

## 2012-06-16 NOTE — Progress Notes (Signed)
Problem number 1 metastatic adenocarcinoma the lung to brain status post brain surgery followed by radiation therapy as well as chemotherapy consisting of carboplatinum/Taxol x6 cycles. She's been observed at this time. She does not sleep well but that is not a new issue. She is going to try Ambien for her primary care physician and would not use Xanax or lorazepam at nighttime. Her husband is with her and they both understand this clearly. 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 Sherwood Gambler #3 placement of a [place] filter that she does not want removed now but may want removed when she has her Port-A-Cath removed if her scans in April all look great. She does not want to return every 6 weeks for Port-A-Cath flush and therefore we have decided that if she is doing well with the scans that both can be removed.  Her weight is stable other vital signs stable. She looks very good. She still has a rounded face. Her lungs are clear. She has no lymphadenopathy in cervical, supraclavicular, infraclavicular, axillary or epitrochlear or inguinal areas. She does have a slightly prominent right pectoralis major tendon which she pointed out to me but is benign. It is slightly tender she states. I suspect she pulled it slightly. Heart shows a regular rhythm and rate without murmur rub or gallop. Port-A-Cath is intact. Abdomen is obese without obvious hepatosplenomegaly bowel sounds are normal has no leg edema whatsoever.  she has soft calves.  The plan is to do the scans as mentioned above in April and if they are good she would like her Port-A-Cath removed and the filter which is fine with me. We will then continue a surveillance plan that we will schedule then.

## 2012-06-16 NOTE — Patient Instructions (Signed)
[facility] Discharge Instructions  RECOMMENDATIONS MADE BY THE CONSULTANT AND ANY TEST RESULTS WILL BE SENT TO YOUR REFERRING PHYSICIAN.  EXAM FINDINGS BY THE PHYSICIAN TODAY AND SIGNS OR SYMPTOMS TO REPORT TO CLINIC OR PRIMARY PHYSICIAN: Exam findings as discussed by Dr. Mariel Sleet.  Please keep your appointments as scheduled.  Thank you for choosing [facility] to provide your oncology and hematology care.  To afford each patient quality time with our providers, please arrive at least 15 minutes before your scheduled appointment time.  With your help, our goal is to use those 15 minutes to complete the necessary work-up to ensure our physicians have the information they need to help with your evaluation and healthcare recommendations.    Effective January 1st, 2014, we ask that you re-schedule your appointment with our physicians should you arrive 10 or more minutes late for your appointment.  We strive to give you quality time with our providers, and arriving late affects you and other patients whose appointments are after yours.    Again, thank you for choosing [facility].  Our hope is that these requests will decrease the amount of time that you wait before being seen by our physicians.       _____________________________________________________________  Should you have questions after your visit to [facility], please contact our office at [phone] between the hours of 8:30 a.m. and 5:00 p.m.  Voicemails left after 4:30 p.m. will not be returned until the following business day.  For prescription refill requests, have your pharmacy contact our office with your prescription refill request.

## 2012-06-20 ENCOUNTER — Ambulatory Visit (HOSPITAL_COMMUNITY)
Admission: RE | Admit: 2012-06-20 | Discharge: 2012-06-20 | Disposition: A | Discharge status: Home / Self Care | Payer: BC Managed Care – PPO | Source: Ambulatory Visit | Attending: Internal Medicine | Admitting: Internal Medicine

## 2012-06-20 DIAGNOSIS — I6529 Occlusion and stenosis of unspecified carotid artery: Secondary | ICD-10-CM | POA: Insufficient documentation

## 2012-06-20 DIAGNOSIS — Z0000 Encounter for general adult medical examination without abnormal findings: Secondary | ICD-10-CM | POA: Insufficient documentation

## 2012-06-28 ENCOUNTER — Encounter: Payer: Self-pay | Admitting: Vascular Surgery

## 2012-07-04 ENCOUNTER — Other Ambulatory Visit (HOSPITAL_COMMUNITY): Payer: Self-pay | Admitting: Internal Medicine

## 2012-07-04 ENCOUNTER — Ambulatory Visit (HOSPITAL_COMMUNITY)
Admission: RE | Admit: 2012-07-04 | Discharge: 2012-07-04 | Disposition: A | Discharge status: Home / Self Care | Payer: BC Managed Care – PPO | Source: Ambulatory Visit | Attending: Internal Medicine | Admitting: Internal Medicine

## 2012-07-04 DIAGNOSIS — R1084 Generalized abdominal pain: Secondary | ICD-10-CM

## 2012-07-04 DIAGNOSIS — R109 Unspecified abdominal pain: Secondary | ICD-10-CM | POA: Insufficient documentation

## 2012-07-04 DIAGNOSIS — K5900 Constipation, unspecified: Secondary | ICD-10-CM | POA: Insufficient documentation

## 2012-07-04 DIAGNOSIS — C7931 Secondary malignant neoplasm of brain: Secondary | ICD-10-CM | POA: Insufficient documentation

## 2012-07-04 DIAGNOSIS — C7949 Secondary malignant neoplasm of other parts of nervous system: Secondary | ICD-10-CM | POA: Insufficient documentation

## 2012-07-17 ENCOUNTER — Encounter: Payer: Self-pay | Admitting: Vascular Surgery

## 2012-07-18 ENCOUNTER — Ambulatory Visit (INDEPENDENT_AMBULATORY_CARE_PROVIDER_SITE_OTHER): Payer: BC Managed Care – PPO | Admitting: Vascular Surgery

## 2012-07-18 ENCOUNTER — Encounter: Payer: Self-pay | Admitting: Vascular Surgery

## 2012-07-18 DIAGNOSIS — I6529 Occlusion and stenosis of unspecified carotid artery: Secondary | ICD-10-CM

## 2012-07-18 NOTE — Addendum Note (Signed)
Addended by: Dannielle Karvonen on: 07/18/2012 03:43 PM   Modules accepted: Orders

## 2012-07-18 NOTE — Progress Notes (Signed)
[facility] [place] HISTORY AND PHYSICAL   CC: Bilateral Carotid Stenosis Referring Physician: Dr. Evette Cristal  History of Present Illness: Haley Lowe is a 57 y.o. 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 Sherwood Gambler and sent for Carotid Ultrasound and found to have approx 50% stenosis on right and <50% stenosis on left.  Pt is RHD and in early 2013 noted difficulty with right hand fine motor coordination. This was worked up and pt was found to have adenocarcinoma of the brain which had metastasized from right lung. She had tumor resected from left brain followed by Chemo and radiation.  Pt denies  amaurosis,  hemiparesis,  facial droop or aphasia  She states she occasionally has cramping in her left hand and foot, but no loss of function.  She had a Brother who had 2 strokes and is deceased She was sent to [place] for evaluation  Past Medical History  Diagnosis Date  . Hypertension   . PONV (postoperative nausea and vomiting)   . Anxiety   . Depression   . COPD (chronic obstructive pulmonary disease)   . GERD (gastroesophageal reflux disease)   . Headache   . Arthritis   . Adenocarcinoma 09/28/2011  . Hx of adenomatous colonic polyps   . Pulmonary nodules   . Diabetes mellitus   . Emphysema   . 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 Allergy     hips and shoulders  . Cataract     1 lens implaqnt right eye,intact on left eye  . Goiter     nodule watching  . Fibromyalgia     neuropathy tops of both feet  . Metastasis to brain 09/15/11     left frontoparietal region  . History of radiation therapy 10/06/11    1 fraction  15 gray  . Pulmonary emboli 11/04/11    right upper lobe and r lower lobe PE  . Lung nodule seen on imaging study 11/04/11 CT    4mm LLL  . DVT (deep venous thrombosis) 12/09/2011  . Pulmonary embolism 12/09/2011  . Hyperlipidemia      ROS: [x]  Positive   [ ]  Denies    General: [ ]  Weight loss, [ ]  Fever, [ ]  chills Neurologic: [ ]  Dizziness, [ ]  Blackouts, [ ]  Seizure [ ]  Stroke, [ ]  "Mini stroke", [ ]  Slurred speech, [ ]  Temporary blindness; [ ]  weakness in arms or legs, [ ]  Hoarseness Cardiac: [ ]  Chest pain/pressure, [ ]  Shortness of breath at rest [ ]  Shortness of breath with exertion, [ ]  Atrial fibrillation or irregular heartbeat Vascular: [ ]  Pain in legs with walking, [ ]  Pain in legs at rest, [ ]  Pain in legs at night,  [ ]  Non-healing ulcer, [x ] Blood clot in vein/DVT,  [x] PE Pulmonary: [ ]  Home oxygen, [ ]  Productive cough, [ ]  Coughing up blood, [ ]  Asthma,  [ ]  Wheezing Musculoskeletal:  [ ]  Arthritis, [ ]  Low back pain, [ ]  Joint pain Hematologic: [ ]  Easy Bruising, [ ]  Anemia; [ ]  Hepatitis Gastrointestinal: [ ]  Blood in stool, [ ]  Gastroesophageal Reflux/heartburn, [ ]  Trouble swallowing Urinary: [ ]  chronic Kidney disease, [ ]  on HD - [ ]  MWF or [ ]  TTHS, [ ]  Burning with urination, [ ]  Difficulty urinating Skin: [ ]  Rashes, [ ]   Wounds Psychological: [ ]  Anxiety, [ ]  Depression   Social History History  Substance Use Topics  . Smoking status: Former Smoker -- 1.00 packs/day for 40 years    Types: Cigarettes    Quit date: 04/06/2011  . Smokeless tobacco: Never Used  . Alcohol Use: No    Family History Family History  Problem Relation Age of Onset  . Asthma Mother   . Kidney failure Father   . Diabetes Sister   . Heart attack Sister   . Colon cancer Brother   . COPD Sister   . Aneurysm Paternal Grandmother     brain  . Parkinsonism Maternal Uncle   . COPD Brother     Allergies  Allergen Reactions  . Penicillins Swelling    " my brain swelled, and mymouth"  . Codeine Itching    Mild takes benadryl    Current Outpatient Prescriptions  Medication Sig Dispense Refill  . ALPRAZolam (XANAX) 0.5 MG tablet Take 0.5 mg by mouth 3 (three) times daily as needed.       .  Cholecalciferol (VITAMIN D-3 PO) Take 2,000 Units by mouth daily.      . divalproex (DEPAKOTE) 500 MG DR tablet Take 500 mg by mouth 2 (two) times daily.       Marland Kitchen esomeprazole (NEXIUM) 40 MG capsule Take 40 mg by mouth daily.       . folic acid (FOLVITE) 1 MG tablet Take 1 tablet (1 mg total) by mouth daily. Take daily starting 5-7 days before Alimta chemotherapy. Continue until 21 days after Alimta completed.  100 tablet  3  . Hydrocodone-Acetaminophen (VICODIN PO) Take by mouth. Takes 1 tablet (7.5/325) every 4-6 hours as needed for pain.      Marland Kitchen ibuprofen (ADVIL,MOTRIN) 800 MG tablet Take 800 mg by mouth every 8 (eight) hours as needed.      Marland Kitchen LORazepam (ATIVAN) 1 MG tablet Take 1 mg by mouth every 8 (eight) hours as needed.      . metoprolol tartrate (LOPRESSOR) 25 MG tablet Take 25 mg by mouth 2 (two) times daily.      Marland Kitchen olmesartan-hydrochlorothiazide (BENICAR HCT) 20-12.5 MG per tablet Take 0.25 tablets by mouth every morning. 1/4 tablet      . polyethylene glycol (MIRALAX / GLYCOLAX) packet Take 17 g by mouth daily. For constipation      . Simethicone (GAS-X PO) Take 1 tablet by mouth as needed. 1-2 prn      . simvastatin (ZOCOR) 10 MG tablet Take 10 mg by mouth at bedtime.      Marland Kitchen warfarin (COUMADIN) 5 MG tablet Take 5 mg by mouth daily. 6mg  daily current dose as of 06/15/12      . zolpidem (AMBIEN) 10 MG tablet Take 10 mg by mouth at bedtime as needed for sleep.      Marland Kitchen albuterol (PROVENTIL) (2.5 MG/3ML) 0.083% nebulizer solution Take 2.5 mg by nebulization every 6 (six) hours as needed. For shortness of breath      . CLODERM 0.1 % cream Apply 1 application topically as needed. For scalp      . gabapentin (NEURONTIN) 100 MG capsule Take 2 capsules at bedtime  60 capsule  6  . lidocaine-prilocaine (EMLA) cream Apply topically as needed.  30 g  6  . ondansetron (ZOFRAN) 8 MG tablet Take 1 tablet (8 mg total) by mouth 2 (two) times daily. Take two times a day starting the day after chemotherapy  for 2 days. Then take  two times a day as needed for nausea and vomiting.  30 tablet  1  . prochlorperazine (COMPAZINE) 10 MG tablet Take 1 tablet (10 mg total) by mouth every 6 (six) hours as needed (Nausea or vomiting).  30 tablet  1  . prochlorperazine (COMPAZINE) 25 MG suppository Place 1 suppository (25 mg total) rectally every 12 (twelve) hours as needed for nausea.  12 suppository  3  . pseudoephedrine-guaifenesin (MUCINEX D) 60-600 MG per tablet Take 1 tablet by mouth every 12 (twelve) hours.       No current facility-administered medications for this visit.    Physical Examination  Filed Vitals:   07/18/12 1354  BP: 108/69  Pulse: 69  Resp: 18    Body mass index is 34.2 kg/(m^2).  General:  WDWN in NAD Gait: Normal HENT: WNL Eyes: Pupils equal Pulmonary: normal non-labored breathing, without Rales, rhonchi, wheezing Cardiac: RRR, without Murmurs, rubs or gallops; No carotid bruits Abdomen: soft, NT, no masses Skin: no rashes, ulcers noted Vascular Exam/Pulses: 2+ DP pulses palp bilat, palp bilateral femoral pulses 2+ radial pulses palp  Extremities without ischemic changes, no Gangrene , no cellulitis; no open wounds;  Musculoskeletal: no muscle wasting or atrophy  Neurologic: A&O X 3; Appropriate Affect ; SENSATION: normal; MOTOR FUNCTION:  moving all extremities equally. Speech is fluent/normal  Non-Invasive Vascular Imaging: [facility] 06/20/12 Report Right 50-69% Left <50%  ASSESSMENT: Haley Lowe is a 57 y.o. female With PHX Pre- DM, HTN, COPD, DVT and PE who had right hand fine motor difficulties last year and was found to have left brain tumor mets from lung. She was treated with resection and Chemo/Radiation with some resolution of symptoms. Her brother had 2 strokes. Pt was sent for carotid USG and found to have approx 50% stenosis on the right and < 50 % on the left.  Pt is asymptomatic. Cramping in left hand and foot unrelated to carotid  disease  PLAN: F/U for Carotid duplex to assess further advancement of Carotid disease  Clinic MD: TFE    I have examined the patient, reviewed and agree with above. Asymptomatic carotid stenosis. Reviewed symptoms of carotid disease. She will notify this should this occur otherwise we'll see her in one year with repeat duplex  EARLY, TODD, MD 07/18/2012 3:23 PM

## 2012-07-20 ENCOUNTER — Encounter (HOSPITAL_COMMUNITY): Payer: BC Managed Care – PPO | Attending: Oncology

## 2012-07-20 ENCOUNTER — Other Ambulatory Visit (HOSPITAL_COMMUNITY): Payer: BC Managed Care – PPO

## 2012-07-20 DIAGNOSIS — C7949 Secondary malignant neoplasm of other parts of nervous system: Secondary | ICD-10-CM

## 2012-07-20 DIAGNOSIS — C801 Malignant (primary) neoplasm, unspecified: Secondary | ICD-10-CM | POA: Insufficient documentation

## 2012-07-20 DIAGNOSIS — C7931 Secondary malignant neoplasm of brain: Secondary | ICD-10-CM | POA: Insufficient documentation

## 2012-07-20 LAB — COMPREHENSIVE METABOLIC PANEL
ALT: 44 U/L — ABNORMAL HIGH (ref 0–35)
AST: 34 U/L (ref 0–37)
Calcium: 9.2 mg/dL (ref 8.4–10.5)
GFR calc Af Amer: >90 mL/min (ref >90)
Glucose, Bld: 154 mg/dL — ABNORMAL HIGH (ref 70–99)
Sodium: 135 mEq/L (ref 135–145)
Total Protein: 6.6 g/dL (ref 6.0–8.3)

## 2012-07-20 LAB — CBC WITH DIFFERENTIAL/PLATELET
Basophils Absolute: 0 10*3/uL (ref 0.0–0.1)
Basophils Relative: 1 % (ref 0–1)
Eosinophils Absolute: 0.1 10*3/uL (ref 0.0–0.7)
Eosinophils Relative: 2 % (ref 0–5)
MCH: 29.9 pg (ref 26.0–34.0)
MCHC: 33.9 g/dL (ref 30.0–36.0)
MCV: 88.4 fL (ref 78.0–100.0)
Platelets: 179 10*3/uL (ref 150–400)
RDW: 12.9 % (ref 11.5–15.5)

## 2012-07-20 MED ORDER — HEPARIN SOD (PORK) LOCK FLUSH 100 UNIT/ML IV SOLN
500.0000 [IU] | Freq: Once | INTRAVENOUS | Status: AC
  Administered 2012-07-20: 500 [IU] via INTRAVENOUS
  Filled 2012-07-20: qty 5

## 2012-07-20 MED ORDER — HEPARIN SOD (PORK) LOCK FLUSH 100 UNIT/ML IV SOLN
INTRAVENOUS | Status: AC
  Filled 2012-07-20: qty 5

## 2012-07-20 MED ORDER — SODIUM CHLORIDE 0.9 % IJ SOLN
10.0000 mL | INTRAMUSCULAR | Status: DC | PRN
  Administered 2012-07-20: 10 mL via INTRAVENOUS
  Filled 2012-07-20: qty 10

## 2012-07-20 NOTE — Progress Notes (Signed)
Haley Lowe presented for Portacath access and flush. Proper placement of portacath confirmed by CXR. Portacath located right chest wall accessed with  H 20 needle. Good blood return present. Portacath flushed with 20ml NS and 500U/8ml Heparin and needle removed intact. Procedure without incident. Patient tolerated procedure well.

## 2012-07-21 ENCOUNTER — Ambulatory Visit
Admission: RE | Admit: 2012-07-21 | Discharge: 2012-07-21 | Disposition: A | Discharge status: Home / Self Care | Payer: BC Managed Care – PPO | Source: Ambulatory Visit | Attending: Radiation Oncology | Admitting: Radiation Oncology

## 2012-07-21 DIAGNOSIS — C7949 Secondary malignant neoplasm of other parts of nervous system: Secondary | ICD-10-CM

## 2012-07-21 MED ORDER — GADOBENATE DIMEGLUMINE 529 MG/ML IV SOLN
19.0000 mL | Freq: Once | INTRAVENOUS | Status: AC | PRN
  Administered 2012-07-21: 19 mL via INTRAVENOUS

## 2012-07-24 ENCOUNTER — Ambulatory Visit
Admission: RE | Admit: 2012-07-24 | Discharge: 2012-07-24 | Disposition: A | Discharge status: Home / Self Care | Payer: BC Managed Care – PPO | Source: Ambulatory Visit | Attending: Radiation Oncology | Admitting: Radiation Oncology

## 2012-07-24 ENCOUNTER — Encounter: Payer: Self-pay | Admitting: Radiation Oncology

## 2012-07-24 VITALS — BP 103/78 | HR 71 | Temp 97.9°F | Resp 20 | Wt 195.0 lb

## 2012-07-24 DIAGNOSIS — C7931 Secondary malignant neoplasm of brain: Secondary | ICD-10-CM

## 2012-07-24 NOTE — Progress Notes (Signed)
Pt denies HA, pain, loss of appetite, nausea, blurred vision, unsteadiness, dizziness. She reports fatigue. She is not on steroid at this time. Pt takes Hydrocodone prn for pain in left foot which is chronic since surgery in 2005, also takes for neuropathy pain in legs, feet.

## 2012-07-25 NOTE — Progress Notes (Addendum)
Radiation Oncology         [phone] ________________________________  Name: Haley Lowe MRN: [id number]  Date: 07/24/2012  DOB: Apr 18, 1955  Follow-Up Visit Note  CC: [name], MD  [name], MD  Glenford Peers, MD  Diagnosis:   Metastatic adenocarcinoma with brain metastasis  Interval Since Last Radiation:  9 months   Narrative:  The patient returns today for routine follow-up.  The patient returns to clinic today for followup. She indicates that she is doing quite well at this time areas she is on observation and continuing followup with Dr. Mariel Sleet. She does have an upcoming PET scan.  She denies any recent CNS symptoms. She denies any recent significant headaches, nausea, or vision changes. She has been having some fatigue. The patient had a recent MRI scan of the brain and she presents today for discussion of this study.                              ALLERGIES:  is allergic to penicillins and codeine.  Meds: Current Outpatient Prescriptions  Medication Sig Dispense Refill  . albuterol (PROVENTIL) (2.5 MG/3ML) 0.083% nebulizer solution Take 2.5 mg by nebulization every 6 (six) hours as needed. For shortness of breath      . ALPRAZolam (XANAX) 0.5 MG tablet Take 0.5 mg by mouth 3 (three) times daily as needed.       . Cholecalciferol (VITAMIN D-3 PO) Take 2,000 Units by mouth daily.      Marland Kitchen CLODERM 0.1 % cream Apply 1 application topically as needed. For scalp      . divalproex (DEPAKOTE) 500 MG DR tablet Take 500 mg by mouth 2 (two) times daily.       Marland Kitchen esomeprazole (NEXIUM) 40 MG capsule Take 40 mg by mouth daily.       . folic acid (FOLVITE) 1 MG tablet Take 1 tablet (1 mg total) by mouth daily. Take daily starting 5-7 days before Alimta chemotherapy. Continue until 21 days after Alimta completed.  100 tablet  3  . gabapentin (NEURONTIN) 100 MG capsule Take 2 capsules at bedtime  60 capsule  6  . Hydrocodone-Acetaminophen (VICODIN PO) Take by mouth. Takes  1 tablet (7.5/325) every 4-6 hours as needed for pain.      Marland Kitchen ibuprofen (ADVIL,MOTRIN) 800 MG tablet Take 800 mg by mouth every 8 (eight) hours as needed.      . lidocaine-prilocaine (EMLA) cream Apply topically as needed.  30 g  6  . LORazepam (ATIVAN) 1 MG tablet Take 1 mg by mouth every 8 (eight) hours as needed.      . metoprolol tartrate (LOPRESSOR) 25 MG tablet Take 25 mg by mouth 2 (two) times daily.      Marland Kitchen olmesartan-hydrochlorothiazide (BENICAR HCT) 20-12.5 MG per tablet Take 0.25 tablets by mouth every morning. 1/4 tablet      . ondansetron (ZOFRAN) 8 MG tablet Take 1 tablet (8 mg total) by mouth 2 (two) times daily. Take two times a day starting the day after chemotherapy for 2 days. Then take two times a day as needed for nausea and vomiting.  30 tablet  1  . polyethylene glycol (MIRALAX / GLYCOLAX) packet Take 17 g by mouth daily. For constipation      . prochlorperazine (COMPAZINE) 10 MG tablet Take 1 tablet (10 mg total) by mouth every 6 (six) hours as needed (Nausea or vomiting).  30 tablet  1  . prochlorperazine (COMPAZINE) 25 MG suppository Place 1 suppository (25 mg total) rectally every 12 (twelve) hours as needed for nausea.  12 suppository  3  . pseudoephedrine-guaifenesin (MUCINEX D) 60-600 MG per tablet Take 1 tablet by mouth every 12 (twelve) hours.      . Simethicone (GAS-X PO) Take 1 tablet by mouth as needed. 1-2 prn      . simvastatin (ZOCOR) 10 MG tablet Take 10 mg by mouth at bedtime.      Marland Kitchen warfarin (COUMADIN) 5 MG tablet Take 5 mg by mouth daily. 6mg  daily current dose as of 06/15/12      . zolpidem (AMBIEN) 10 MG tablet Take 10 mg by mouth at bedtime as needed for sleep.       No current facility-administered medications for this encounter.    Physical Findings: The patient is in no acute distress. Patient is alert and oriented.  weight is 195 lb (88.451 kg). Her temperature is 97.9 F (36.6 C). Her blood pressure is 103/78 and her pulse is 71. Her respiration  is 20 and oxygen saturation is 99%. .   General: Well-developed, in no acute distress HEENT: Normocephalic, atraumatic Cardiovascular: Regular rate and rhythm Respiratory: Clear to auscultation bilaterally GI: Soft, nontender, normal bowel sounds Extremities: No edema present   Lab Findings: Lab Results  Component Value Date   WBC 3.8* 07/20/2012   HGB 13.1 07/20/2012   HCT 38.7 07/20/2012   MCV 88.4 07/20/2012   PLT 179 07/20/2012     Radiographic Findings: Mr Laqueta Jean AO Contrast  07/21/2012  *RADIOLOGY REPORT*  Clinical Data: Restaging, follow-up.  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  MRI HEAD WITHOUT AND WITH CONTRAST  Technique:  Multiplanar, multiecho pulse sequences of the brain and surrounding structures were obtained according to standard protocol without and with intravenous contrast  Contrast: 19mL MULTIHANCE GADOBENATE DIMEGLUMINE 529 MG/ML IV SOLN  Comparison: Most recent 04/28/2012.  Findings: There is a small cavity left by the resection of the previously identified left posterior frontal cortex.  Thin rim of chronic blood products.  Mild surrounding gliosis.  No abnormal post contrast enhancement.  No postsurgical complications.  No new metastatic deposits are identified.  Mild premature atrophy.  Slight chronic microvascular ischemic change. No acute stroke.  Major flow voids preserved.  Normal pituitary and cerebellar tonsils.  Negative orbits, sinuses, and mastoids.  IMPRESSION: Stable postsurgical appearance left posterior frontal metastasis. No recurrent tumor.  No new lesions are seen.   Original Report Authenticated By: Davonna Belling, M.D.    Dg Abd Acute W/chest  07/04/2012  *RADIOLOGY REPORT*  Clinical Data: Abdominal pain for 1 week, constipation, history adenocarcinoma metastasis of brain  ACUTE ABDOMEN SERIES (ABDOMEN 2 VIEW & CHEST 1 VIEW)  Comparison: Chest radiograph 03/10/2011 Correlation:  PET CT 01/06/2012  Findings: Right jugular Port-A-Cath with tip projecting  over SVC. Normal heart size, mediastinal contours, and pulmonary vascularity. Lungs clear. No pleural effusion or pneumothorax. IVC filter noted. Surgical clips right upper quadrant question cholecystectomy. Nonobstructive bowel gas pattern. No bowel dilatation, bowel wall thickening or free intraperitoneal air. Bones demineralized. No urinary tract calcification.  IMPRESSION: No acute abnormalities.   Original Report Authenticated By: Ulyses Southward, M.D.     Impression:    The patient is doing well clinically at this time. Her recent MRI scan of the brain looked good with no suspicious findings.   Plan:   We will continue ongoing followup with repeat imaging in several months.  I spent 10 minutes with the patient today, the majority of which was spent counseling the patient on the diagnosis of cancer and coordinating care.  Radene Gunning, M.D., Ph.D.

## 2012-07-26 ENCOUNTER — Ambulatory Visit (HOSPITAL_COMMUNITY)
Admission: RE | Admit: 2012-07-26 | Discharge: 2012-07-26 | Disposition: A | Discharge status: Home / Self Care | Payer: BC Managed Care – PPO | Source: Ambulatory Visit | Attending: Oncology | Admitting: Oncology

## 2012-07-26 DIAGNOSIS — J984 Other disorders of lung: Secondary | ICD-10-CM | POA: Insufficient documentation

## 2012-07-26 DIAGNOSIS — Z90710 Acquired absence of both cervix and uterus: Secondary | ICD-10-CM | POA: Insufficient documentation

## 2012-07-26 DIAGNOSIS — C7931 Secondary malignant neoplasm of brain: Secondary | ICD-10-CM | POA: Insufficient documentation

## 2012-07-26 DIAGNOSIS — K7689 Other specified diseases of liver: Secondary | ICD-10-CM | POA: Insufficient documentation

## 2012-07-26 DIAGNOSIS — C3490 Malignant neoplasm of unspecified part of unspecified bronchus or lung: Secondary | ICD-10-CM | POA: Insufficient documentation

## 2012-07-26 DIAGNOSIS — Z9089 Acquired absence of other organs: Secondary | ICD-10-CM | POA: Insufficient documentation

## 2012-07-26 LAB — GLUCOSE, CAPILLARY: Glucose-Capillary: 140 mg/dL — ABNORMAL HIGH (ref 70–99)

## 2012-07-26 MED ORDER — FLUDEOXYGLUCOSE F - 18 (FDG) INJECTION
17.1000 | Freq: Once | INTRAVENOUS | Status: AC | PRN
  Administered 2012-07-26: 17.1 via INTRAVENOUS

## 2012-07-28 ENCOUNTER — Ambulatory Visit (HOSPITAL_COMMUNITY): Payer: BC Managed Care – PPO | Admitting: Oncology

## 2012-07-31 ENCOUNTER — Encounter (HOSPITAL_COMMUNITY): Payer: Self-pay | Admitting: Oncology

## 2012-07-31 ENCOUNTER — Other Ambulatory Visit (HOSPITAL_COMMUNITY): Payer: Self-pay | Admitting: *Deleted

## 2012-07-31 ENCOUNTER — Encounter (HOSPITAL_BASED_OUTPATIENT_CLINIC_OR_DEPARTMENT_OTHER): Payer: BC Managed Care – PPO | Admitting: Oncology

## 2012-07-31 ENCOUNTER — Telehealth (HOSPITAL_COMMUNITY): Payer: Self-pay | Admitting: Oncology

## 2012-07-31 VITALS — BP 104/71 | HR 72 | Temp 98.1°F | Resp 18 | Wt 193.0 lb

## 2012-07-31 DIAGNOSIS — I82409 Acute embolism and thrombosis of unspecified deep veins of unspecified lower extremity: Secondary | ICD-10-CM

## 2012-07-31 DIAGNOSIS — C7931 Secondary malignant neoplasm of brain: Secondary | ICD-10-CM

## 2012-07-31 DIAGNOSIS — I2699 Other pulmonary embolism without acute cor pulmonale: Secondary | ICD-10-CM

## 2012-07-31 DIAGNOSIS — I82402 Acute embolism and thrombosis of unspecified deep veins of left lower extremity: Secondary | ICD-10-CM

## 2012-07-31 DIAGNOSIS — C3490 Malignant neoplasm of unspecified part of unspecified bronchus or lung: Secondary | ICD-10-CM

## 2012-07-31 DIAGNOSIS — C7949 Secondary malignant neoplasm of other parts of nervous system: Secondary | ICD-10-CM

## 2012-07-31 NOTE — Patient Instructions (Addendum)
[facility] Discharge Instructions  RECOMMENDATIONS MADE BY THE CONSULTANT AND ANY TEST RESULTS WILL BE SENT TO YOUR REFERRING PHYSICIAN.  EXAM FINDINGS BY THE PHYSICIAN TODAY AND SIGNS OR SYMPTOMS TO REPORT TO CLINIC OR PRIMARY PHYSICIAN: Exam and discussion by MD.  Your scans were good.  You appear to be in remission.  Remission does not necessarily mean cure.  You can get your port and filter removed.  If your cancer should recur we would have to have your port re-inserted. Need to get more active and eat better foods.  Want to eat more foods with fiber and protein. Can eat greens/salads, etc but need to be consistent with eating them.  MEDICATIONS PRESCRIBED:  Increase your miralax to twice daily to get your bowels moving well. Alternate taking your vicodin and ibuprofen during the day to get your foot pain improved so you can get more active.  (Can alternate Vicodin 5 days on with Ibuprofen 5 days on or you can alternate vicodin and ibuprofen daily.  Can take them up to 3 times daily.)  INSTRUCTIONS GIVEN AND DISCUSSED: Report cough, new lumps, bone pain or shortness of breath.  SPECIAL INSTRUCTIONS/FOLLOW-UP: Blood work in 3 months and to be seen in follow-up afterwards.  Thank you for choosing [facility] to provide your oncology and hematology care.  To afford each patient quality time with our providers, please arrive at least 15 minutes before your scheduled appointment time.  With your help, our goal is to use those 15 minutes to complete the necessary work-up to ensure our physicians have the information they need to help with your evaluation and healthcare recommendations.    Effective January 1st, 2014, we ask that you re-schedule your appointment with our physicians should you arrive 10 or more minutes late for your appointment.  We strive to give you quality time with our providers, and arriving late affects you and other patients whose  appointments are after yours.    Again, thank you for choosing [facility].  Our hope is that these requests will decrease the amount of time that you wait before being seen by our physicians.       _____________________________________________________________  Should you have questions after your visit to [facility], please contact our office at [phone] between the hours of 8:30 a.m. and 5:00 p.m.  Voicemails left after 4:30 p.m. will not be returned until the following business day.  For prescription refill requests, have your pharmacy contact our office with your prescription refill request.

## 2012-07-31 NOTE — Progress Notes (Signed)
#  1 metastatic adenocarcinoma of the lung metastatic to brain status post surgery followed by radiation therapy as well as chemotherapy consisting of carboplatinum/paclitaxel x6 cycles. Presently she is been observed and presently has no evidence of obvious disease by MRI scan of the brain or PET scan. #2 DVT left leg with pulmonary embolus on Coumadin. She would like to know she can come off the Coumadin but she could potentially physical and she is in complete remission. She is certainly at risk of a recurrent disease both in the standpoint of cancer and standpoint of a DVT. If she desires however he could come off the Coumadin and go on it again for life if she has a recurrence. She would also like her Port-A-Cath removed and her IVC filter removed. This is feasible since she is in remission as best we can judge at this juncture. #3 [place] filter placement and began we will plan on getting not removed. The other option of course is to continue the Coumadin from a safety standpoint. #4 chronic constipation from poor diet and medications #5 chronic pain in her legs and feet which is probably neuropathic. She did not do well with gabapentin and doesn't want to try that again. She therefore doesn't want she states because of her foot pain and leg pain which is worse when she is up on her feet or when she tried sleep at night. She is far better when she is sitting in a chair.  I suspect she has pain from neuropathy and needs to try something to be up and about since her weight continues to rise.  She is going to try hydrocodone alternating with ibuprofen on successive days.  If this does not work then we could tried Lyrica potentially.  Vital signs are stable otherwise. Lymph nodes are negative throughout. Port-A-Cath is intact. Lungs are clear. Heart shows a regular rhythm and rate without murmur or gallop. Abdomen is soft and nontender. Bowel sounds are diminished. She has no leg edema at this  time.  We spent most of our time in counseling about the various issues. I think the safest thing is to continue the Coumadin but it's not necessarily absolute.  She will think about things but we will arrange for her Port-A-Cath and her felt to be removed since she is very adamant about the fact that we are not using her port this time.  We also talked about her diet. She needs to be EGD absolutely more fiber rich foods whether she is on Coumadin or not on Coumadin. What I reminded her of his the fact that if she can stand stable.we can always adjust her Coumadin dose that her diet.  Will see her in 3 months. She states that Dr. Mitzi Hansen we will repeat her MRI then. I think we should just watch her examiner that point in time and set up scans 6 months from now. I think we could get by with CT scans of her chest abdomen and pelvis with contrast.

## 2012-07-31 NOTE — Telephone Encounter (Signed)
Per Dr. Mariel Sleet, patient may stop her Coumadin for 4 days prior to filter and port removal.  Scheduling staff will communicate stop date with patient once she is scheduled.

## 2012-08-01 ENCOUNTER — Ambulatory Visit (HOSPITAL_COMMUNITY)
Admission: RE | Admit: 2012-08-01 | Discharge: 2012-08-01 | Disposition: A | Discharge status: Home / Self Care | Payer: BC Managed Care – PPO | Source: Ambulatory Visit | Attending: Oncology | Admitting: Oncology

## 2012-08-01 DIAGNOSIS — I82509 Chronic embolism and thrombosis of unspecified deep veins of unspecified lower extremity: Secondary | ICD-10-CM | POA: Insufficient documentation

## 2012-08-01 DIAGNOSIS — Z7901 Long term (current) use of anticoagulants: Secondary | ICD-10-CM | POA: Insufficient documentation

## 2012-08-01 DIAGNOSIS — C7931 Secondary malignant neoplasm of brain: Secondary | ICD-10-CM

## 2012-08-01 DIAGNOSIS — I82402 Acute embolism and thrombosis of unspecified deep veins of left lower extremity: Secondary | ICD-10-CM

## 2012-08-02 ENCOUNTER — Other Ambulatory Visit: Payer: Self-pay | Admitting: Radiology

## 2012-08-04 ENCOUNTER — Telehealth (HOSPITAL_COMMUNITY): Payer: Self-pay | Admitting: *Deleted

## 2012-08-04 ENCOUNTER — Encounter (HOSPITAL_COMMUNITY): Payer: Self-pay | Admitting: Pharmacy Technician

## 2012-08-04 NOTE — Telephone Encounter (Signed)
Pt notified that it was ok for Dr. Sherwood Gambler to manage Coumadin dosing after procedure next week. I had to leave a msg on patient's home answering machine as well as cell phone. I asked pt to call back to let me know that she got this message.

## 2012-08-08 ENCOUNTER — Other Ambulatory Visit: Payer: Self-pay | Admitting: Radiology

## 2012-08-10 ENCOUNTER — Encounter (HOSPITAL_COMMUNITY): Payer: Self-pay

## 2012-08-10 ENCOUNTER — Ambulatory Visit (HOSPITAL_COMMUNITY)
Admission: RE | Admit: 2012-08-10 | Discharge: 2012-08-10 | Disposition: A | Discharge status: Home / Self Care | Payer: BC Managed Care – PPO | Source: Ambulatory Visit | Attending: Oncology | Admitting: Oncology

## 2012-08-10 DIAGNOSIS — Z4689 Encounter for fitting and adjustment of other specified devices: Secondary | ICD-10-CM | POA: Insufficient documentation

## 2012-08-10 DIAGNOSIS — Z98870 Personal history of in utero procedure during pregnancy: Secondary | ICD-10-CM | POA: Insufficient documentation

## 2012-08-10 DIAGNOSIS — Z79899 Other long term (current) drug therapy: Secondary | ICD-10-CM | POA: Insufficient documentation

## 2012-08-10 DIAGNOSIS — E119 Type 2 diabetes mellitus without complications: Secondary | ICD-10-CM | POA: Insufficient documentation

## 2012-08-10 DIAGNOSIS — C7931 Secondary malignant neoplasm of brain: Secondary | ICD-10-CM

## 2012-08-10 DIAGNOSIS — Z86711 Personal history of pulmonary embolism: Secondary | ICD-10-CM | POA: Insufficient documentation

## 2012-08-10 DIAGNOSIS — J4489 Other specified chronic obstructive pulmonary disease: Secondary | ICD-10-CM | POA: Insufficient documentation

## 2012-08-10 DIAGNOSIS — K219 Gastro-esophageal reflux disease without esophagitis: Secondary | ICD-10-CM | POA: Insufficient documentation

## 2012-08-10 DIAGNOSIS — J449 Chronic obstructive pulmonary disease, unspecified: Secondary | ICD-10-CM | POA: Insufficient documentation

## 2012-08-10 DIAGNOSIS — E785 Hyperlipidemia, unspecified: Secondary | ICD-10-CM | POA: Insufficient documentation

## 2012-08-10 DIAGNOSIS — Z452 Encounter for adjustment and management of vascular access device: Secondary | ICD-10-CM | POA: Insufficient documentation

## 2012-08-10 DIAGNOSIS — Z85118 Personal history of other malignant neoplasm of bronchus and lung: Secondary | ICD-10-CM | POA: Insufficient documentation

## 2012-08-10 DIAGNOSIS — I10 Essential (primary) hypertension: Secondary | ICD-10-CM | POA: Insufficient documentation

## 2012-08-10 LAB — BASIC METABOLIC PANEL
BUN: 17 mg/dL (ref 6–23)
CO2: 25 mEq/L (ref 19–32)
Chloride: 101 mEq/L (ref 96–112)
Creatinine, Ser: 0.79 mg/dL (ref 0.50–1.10)
GFR calc Af Amer: >90 mL/min (ref >90)
Potassium: 4.3 mEq/L (ref 3.5–5.1)

## 2012-08-10 LAB — CBC
HCT: 38.6 % (ref 36.0–46.0)
Hemoglobin: 12.7 g/dL (ref 12.0–15.0)
MCV: 88.7 fL (ref 78.0–100.0)
WBC: 4.4 10*3/uL (ref 4.0–10.5)

## 2012-08-10 LAB — PROTIME-INR: INR: 0.92 (ref 0.00–1.49)

## 2012-08-10 LAB — APTT: aPTT: 27 seconds (ref 24–37)

## 2012-08-10 MED ORDER — FENTANYL CITRATE 0.05 MG/ML IJ SOLN
INTRAMUSCULAR | Status: AC
  Filled 2012-08-10: qty 6

## 2012-08-10 MED ORDER — FENTANYL CITRATE 0.05 MG/ML IJ SOLN
INTRAMUSCULAR | Status: AC | PRN
  Administered 2012-08-10: 50 ug via INTRAVENOUS
  Administered 2012-08-10: 25 ug via INTRAVENOUS

## 2012-08-10 MED ORDER — MIDAZOLAM HCL 2 MG/2ML IJ SOLN
INTRAMUSCULAR | Status: AC
  Filled 2012-08-10: qty 6

## 2012-08-10 MED ORDER — VANCOMYCIN HCL IN DEXTROSE 1-5 GM/200ML-% IV SOLN
1000.0000 mg | Freq: Once | INTRAVENOUS | Status: AC
  Administered 2012-08-10: 1000 mg via INTRAVENOUS

## 2012-08-10 MED ORDER — MIDAZOLAM HCL 2 MG/2ML IJ SOLN
INTRAMUSCULAR | Status: AC | PRN
  Administered 2012-08-10: 1 mg via INTRAVENOUS

## 2012-08-10 MED ORDER — SODIUM CHLORIDE 0.9 % IV SOLN
Freq: Once | INTRAVENOUS | Status: AC
  Administered 2012-08-10: 12:00:00 via INTRAVENOUS

## 2012-08-10 MED ORDER — LIDOCAINE HCL 1 % IJ SOLN
INTRAMUSCULAR | Status: AC
  Filled 2012-08-10: qty 20

## 2012-08-10 MED ORDER — IOHEXOL 300 MG/ML  SOLN
50.0000 mL | Freq: Once | INTRAMUSCULAR | Status: AC | PRN
  Administered 2012-08-10: 1 mL

## 2012-08-10 MED ORDER — VANCOMYCIN HCL IN DEXTROSE 1-5 GM/200ML-% IV SOLN
INTRAVENOUS | Status: AC
  Filled 2012-08-10: qty 200

## 2012-08-10 NOTE — Procedures (Signed)
Interventional Radiology Procedure Note  Procedure: 1. Successful removal of right IJ portacatheter 2. Successful retrieval of IVC filter Complications: None Recommendations: - Bedrest x 2 hrs - Keep wounds covered x 24 hrs  Signed,  Sterling Big, MD Vascular & Interventional Radiologist [facility]

## 2012-08-10 NOTE — H&P (Signed)
Haley Lowe is an 57 y.o. female.   Chief Complaint: "I'm getting my port and filter taken out" HPI: Patient with history of metastatic lung carcinoma( currently in remission), and prior LLE DVT/PE(due to restart coumadin) who presents today for port a cath and IVC filter removal.   Past Medical History  Diagnosis Date  . Hypertension   . PONV (postoperative nausea and vomiting)   . Anxiety   . Depression   . COPD (chronic obstructive pulmonary disease)   . GERD (gastroesophageal reflux disease)   . Headache   . Arthritis   . Adenocarcinoma 09/28/2011  . Hx of adenomatous colonic polyps   . Pulmonary nodules   . Diabetes mellitus   . Emphysema   . 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 Allergy     hips and shoulders  . Cataract     1 lens implaqnt right eye,intact on left eye  . Goiter     nodule watching  . Fibromyalgia     neuropathy tops of both feet  . Metastasis to brain 09/15/11     left frontoparietal region  . History of radiation therapy 10/06/11    1 fraction  15 gray  . Pulmonary emboli 11/04/11    right upper lobe and r lower lobe PE  . Lung nodule seen on imaging study 11/04/11 CT    4mm LLL  . DVT (deep venous thrombosis) 12/09/2011  . Pulmonary embolism 12/09/2011  . Hyperlipidemia     Past Surgical History  Procedure Laterality Date  . Abdominal hysterectomy  1996  . Cholecystectomy  2000  . Shoulder surgery  1998    right  . Eye surgery  2001    vision correction  . Foot surgery  2005    left  . Craniotomy  09/15/2011    Procedure: CRANIOTOMY TUMOR EXCISION;  Surgeon: [name], MD;  Location: [facility];  Service: Neurosurgery;  Laterality: N/A;  Craniotomy resection of tumor with stealth  . Cataract extraction  2011    with lens implant    Family History  Problem Relation Age of Onset  . Asthma Mother   . Kidney failure Father   . Diabetes Sister   . Heart attack Sister   . Colon cancer Brother   . COPD  Sister   . Aneurysm Paternal Grandmother     brain  . Parkinsonism Maternal Uncle   . COPD Brother    Social History:  reports that she quit smoking about 16 months ago. Her smoking use included Cigarettes. She has a 40 pack-year smoking history. She has never used smokeless tobacco. She reports that she does not drink alcohol or use illicit drugs.  Allergies:  Allergies  Allergen Reactions  . Penicillins Swelling    " my brain swelled, and mymouth"  . Codeine Itching    Mild takes benadryl    Current outpatient prescriptions:ALPRAZolam (XANAX) 0.5 MG tablet, Take 0.5 mg by mouth 3 (three) times daily as needed for anxiety. , Disp: , Rfl: ;  Cholecalciferol (VITAMIN D-3 PO), Take 2,000 Units by mouth daily., Disp: , Rfl: ;  divalproex (DEPAKOTE) 500 MG DR tablet, Take 500 mg by mouth 2 (two) times daily. , Disp: , Rfl:  HYDROcodone-acetaminophen (NORCO) 7.5-325 MG per tablet, Take 1 tablet by mouth every 6 (six) hours as needed for pain., Disp: , Rfl: ;  ibuprofen (ADVIL,MOTRIN) 800 MG tablet, Take 800 mg by mouth every 8 (  eight) hours as needed for pain. , Disp: , Rfl: ;  LORazepam (ATIVAN) 1 MG tablet, Take 1 mg by mouth every 8 (eight) hours as needed for anxiety. , Disp: , Rfl:  metoprolol tartrate (LOPRESSOR) 25 MG tablet, Take 25 mg by mouth 2 (two) times daily., Disp: , Rfl: ;  olmesartan-hydrochlorothiazide (BENICAR HCT) 20-12.5 MG per tablet, Take 0.25 tablets by mouth every morning. 1/4 tablet, Disp: , Rfl: ;  polyethylene glycol (MIRALAX / GLYCOLAX) packet, Take 17 g by mouth daily. For constipation, Disp: , Rfl: ;  simvastatin (ZOCOR) 10 MG tablet, Take 10 mg by mouth at bedtime., Disp: , Rfl:  sucralfate (CARAFATE) 1 GM/10ML suspension, Take 1 g by mouth 4 (four) times daily., Disp: , Rfl: ;  zolpidem (AMBIEN) 10 MG tablet, Take 10 mg by mouth at bedtime as needed for sleep., Disp: , Rfl: ;  albuterol (PROVENTIL) (2.5 MG/3ML) 0.083% nebulizer solution, Take 2.5 mg by nebulization  every 6 (six) hours as needed. For shortness of breath, Disp: , Rfl:  pseudoephedrine-guaifenesin (MUCINEX D) 60-600 MG per tablet, Take 1 tablet by mouth 2 (two) times daily as needed for congestion. , Disp: , Rfl: ;  Simethicone (GAS-X PO), Take 1-2 tablets by mouth 3 (three) times daily as needed (gas). , Disp: , Rfl: ;  warfarin (COUMADIN) 1 MG tablet, Take 1 mg by mouth daily. Pt takes a total of 6mg , Disp: , Rfl:  warfarin (COUMADIN) 5 MG tablet, Take 5 mg by mouth daily. 6mg  daily current dose as of 4/28.14, Disp: , Rfl:   No results found for this or any previous visit (from the past 48 hour(s)). No results found.  Review of Systems  Unable to perform ROS Constitutional: Negative for fever and chills.  Respiratory: Negative for cough and shortness of breath.   Cardiovascular: Negative for chest pain.  Gastrointestinal: Negative for nausea, vomiting and abdominal pain.  Musculoskeletal: Negative for back pain.  Neurological: Negative for headaches.  Endo/Heme/Allergies: Does not bruise/bleed easily.   VIitals: BP 113/77  HR 66  R 16  TEMP 97.8  O2 SATS 98%RA Physical Exam  Constitutional: She is oriented to person, place, and time. She appears well-developed and well-nourished.  Cardiovascular: Normal rate and regular rhythm.   Respiratory: Effort normal and breath sounds normal.  Clean, intact rt chest wall PAC  GI: Soft. Bowel sounds are normal. There is no tenderness.  Musculoskeletal: Normal range of motion. She exhibits no edema.  Neurological: She is alert and oriented to person, place, and time.     Assessment/Plan Pt with metastatic lung carcinoma, currently in remission and prior LLE DVT/PE (due to restart coumadin). She is s/p placement of rt chest wall port a cath and IVC filter in 2013. She presents today for removal of both port a cath and IVC filter . Details/risks of procedures were d/w pt/husband with their understanding and consent.  Haley Lowe,D KEVIN 08/10/2012,  12:30 PM

## 2012-08-10 NOTE — H&P (Signed)
Agree with PA note.  Negative DVT US at the end of April.   Signed,  Sterling Big, MD Vascular & Interventional Radiologist [facility] Radiology

## 2012-08-22 ENCOUNTER — Other Ambulatory Visit: Payer: Self-pay | Admitting: Radiation Therapy

## 2012-08-22 DIAGNOSIS — C7931 Secondary malignant neoplasm of brain: Secondary | ICD-10-CM

## 2012-08-31 ENCOUNTER — Encounter (HOSPITAL_COMMUNITY): Payer: BC Managed Care – PPO

## 2012-09-15 ENCOUNTER — Ambulatory Visit (HOSPITAL_COMMUNITY)
Admission: RE | Admit: 2012-09-15 | Discharge: 2012-09-15 | Disposition: A | Discharge status: Home / Self Care | Payer: BC Managed Care – PPO | Source: Ambulatory Visit | Attending: Family Medicine | Admitting: Family Medicine

## 2012-09-15 ENCOUNTER — Other Ambulatory Visit (HOSPITAL_COMMUNITY): Payer: Self-pay | Admitting: Family Medicine

## 2012-09-15 DIAGNOSIS — R059 Cough, unspecified: Secondary | ICD-10-CM | POA: Insufficient documentation

## 2012-09-15 DIAGNOSIS — Z87891 Personal history of nicotine dependence: Secondary | ICD-10-CM | POA: Insufficient documentation

## 2012-09-15 DIAGNOSIS — R05 Cough: Secondary | ICD-10-CM

## 2012-10-03 LAB — PROTIME-INR

## 2012-10-18 ENCOUNTER — Other Ambulatory Visit: Payer: Self-pay | Admitting: Radiation Therapy

## 2012-10-18 DIAGNOSIS — C7949 Secondary malignant neoplasm of other parts of nervous system: Secondary | ICD-10-CM

## 2012-10-24 ENCOUNTER — Ambulatory Visit
Admission: RE | Admit: 2012-10-24 | Discharge: 2012-10-24 | Disposition: A | Discharge status: Home / Self Care | Payer: BC Managed Care – PPO | Source: Ambulatory Visit | Attending: Radiation Oncology | Admitting: Radiation Oncology

## 2012-10-24 DIAGNOSIS — C7931 Secondary malignant neoplasm of brain: Secondary | ICD-10-CM

## 2012-10-24 MED ORDER — GADOBENATE DIMEGLUMINE 529 MG/ML IV SOLN
18.0000 mL | Freq: Once | INTRAVENOUS | Status: AC | PRN
  Administered 2012-10-24: 18 mL via INTRAVENOUS

## 2012-10-30 ENCOUNTER — Encounter (HOSPITAL_COMMUNITY): Payer: BC Managed Care – PPO | Attending: Hematology and Oncology

## 2012-10-30 DIAGNOSIS — C7931 Secondary malignant neoplasm of brain: Secondary | ICD-10-CM

## 2012-10-30 DIAGNOSIS — Z85841 Personal history of malignant neoplasm of brain: Secondary | ICD-10-CM | POA: Insufficient documentation

## 2012-10-30 DIAGNOSIS — C801 Malignant (primary) neoplasm, unspecified: Secondary | ICD-10-CM

## 2012-10-30 DIAGNOSIS — Z09 Encounter for follow-up examination after completed treatment for conditions other than malignant neoplasm: Secondary | ICD-10-CM | POA: Insufficient documentation

## 2012-10-30 LAB — CBC WITH DIFFERENTIAL/PLATELET
Basophils Absolute: 0 10*3/uL (ref 0.0–0.1)
Basophils Relative: 1 % (ref 0–1)
MCHC: 32.9 g/dL (ref 30.0–36.0)
Neutro Abs: 2.4 10*3/uL (ref 1.7–7.7)
Neutrophils Relative %: 55 % (ref 43–77)
Platelets: 170 10*3/uL (ref 150–400)
RDW: 13.2 % (ref 11.5–15.5)

## 2012-10-30 LAB — COMPREHENSIVE METABOLIC PANEL
ALT: 24 U/L (ref 0–35)
BUN: 13 mg/dL (ref 6–23)
CO2: 32 mEq/L (ref 19–32)
Calcium: 9.7 mg/dL (ref 8.4–10.5)
GFR calc Af Amer: >90 mL/min (ref >90)
GFR calc non Af Amer: >90 mL/min (ref >90)
Glucose, Bld: 165 mg/dL — ABNORMAL HIGH (ref 70–99)
Total Protein: 7 g/dL (ref 6.0–8.3)

## 2012-10-30 NOTE — Progress Notes (Signed)
Labs drawn today for cbc/diff,cmp 

## 2012-11-01 ENCOUNTER — Encounter (HOSPITAL_BASED_OUTPATIENT_CLINIC_OR_DEPARTMENT_OTHER): Payer: BC Managed Care – PPO

## 2012-11-01 ENCOUNTER — Encounter (HOSPITAL_COMMUNITY): Payer: Self-pay

## 2012-11-01 VITALS — BP 113/77 | HR 63 | Temp 98.0°F | Resp 18 | Wt 186.6 lb

## 2012-11-01 DIAGNOSIS — C7931 Secondary malignant neoplasm of brain: Secondary | ICD-10-CM

## 2012-11-01 DIAGNOSIS — C801 Malignant (primary) neoplasm, unspecified: Secondary | ICD-10-CM

## 2012-11-01 DIAGNOSIS — Z86718 Personal history of other venous thrombosis and embolism: Secondary | ICD-10-CM

## 2012-11-01 DIAGNOSIS — C7949 Secondary malignant neoplasm of other parts of nervous system: Secondary | ICD-10-CM

## 2012-11-01 NOTE — Patient Instructions (Addendum)
[facility] Discharge Instructions  RECOMMENDATIONS MADE BY THE CONSULTANT AND ANY TEST RESULTS WILL BE SENT TO YOUR REFERRING PHYSICIAN.  You may STOP Warfarin. Discuss with your primary care physician complaints of muscle spasms. You may try Tonic Water or Quinine for your muscle spasms. CT scan in 3 months prior to MD appointment. Report any issues/concerns to clinic as needed prior to appointments.  Thank you for choosing [facility] to provide your oncology and hematology care.  To afford each patient quality time with our providers, please arrive at least 15 minutes before your scheduled appointment time.  With your help, our goal is to use those 15 minutes to complete the necessary work-up to ensure our physicians have the information they need to help with your evaluation and healthcare recommendations.    Effective January 1st, 2014, we ask that you re-schedule your appointment with our physicians should you arrive 10 or more minutes late for your appointment.  We strive to give you quality time with our providers, and arriving late affects you and other patients whose appointments are after yours.    Again, thank you for choosing [facility].  Our hope is that these requests will decrease the amount of time that you wait before being seen by our physicians.       _____________________________________________________________  Should you have questions after your visit to [facility], please contact our office at [phone] between the hours of 8:30 a.m. and 5:00 p.m.  Voicemails left after 4:30 p.m. will not be returned until the following business day.  For prescription refill requests, have your pharmacy contact our office with your prescription refill request.

## 2012-11-01 NOTE — Progress Notes (Signed)
[facility] [phone]   [phone]  OFFICE PROGRESS NOTE  [name], MD [address] [place] [address]  DIAGNOSIS: Metastatic adenocarcinoma to the brain suspected to be lung primary.  INTERVAL HISTORY: : Patient is a 57 year old woman with at least 40 pack years of smoking who was diagnosed with metastatic adenocarcinoma following resection of frontal lobe lesion on 09/15/2011.  Immunophenotype of the lesion is as follows; Cytokeratin 7 - strong diffuse expression. Cytokeratin AE1/AE3 - strong diffuse expression. Cytokeratin 20 - negative expression. CDX-2 - negative expression. TTF-1 - negative expression. Napsin A - negative expression. Estrogen receptor - negative expression. GFAP - negative expression. GCDFP - negative expression. Mucicarmine - patchy strong expression.  Initial  PET/CT scan had shown 7 millimeter hypermetabolic nodule in the right upper lobe of the lung with SUV of 4.7, another lesion was also noted in the left lobe of the liver with SUV of 4.8 this was though to be atypical for metastasis. Subsequently she was treated with radiation and the 6 cycles of chemotherapy with Carbo / Taxol. Since then she has been monitored without evidence of disease.  She also has history of PE has been on anticoagulations for about a year now.  She  returns to the clinic today for scheduled follow up.  She reports that she is being weaned off Depakote and complaints of cramps in the legs and hands which she has reported to Dr. Sherwood Gambler in the past.  She denies any seizures, headaches or focal neurologic weakness.  Denies any chest pain cough or shortness of breath.  She states that Dr. Mariel Sleet had previously told her that she would be Of warfarin and his visits.  Recent MRI of port noted images also reviewed by me.  MEDICAL HISTORY: Past Medical History  Diagnosis Date  . Hypertension   . PONV  (postoperative nausea and vomiting)   . Anxiety   . Depression   . COPD (chronic obstructive pulmonary disease)   . GERD (gastroesophageal reflux disease)   . Headache(784.0)   . Arthritis   . Adenocarcinoma 09/28/2011  . Hx of adenomatous colonic polyps   . Pulmonary nodules   . Diabetes mellitus   . Emphysema   . 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 Allergy     hips and shoulders  . Cataract     1 lens implaqnt right eye,intact on left eye  . Goiter     nodule watching  . Fibromyalgia     neuropathy tops of both feet  . Metastasis to brain 09/15/11     left frontoparietal region  . History of radiation therapy 10/06/11    1 fraction  15 gray  . Pulmonary emboli 11/04/11    right upper lobe and r lower lobe PE  . Lung nodule seen on imaging study 11/04/11 CT    4mm LLL  . DVT (deep venous thrombosis) 12/09/2011  . Pulmonary embolism 12/09/2011  . Hyperlipidemia     ALLERGIES:  is allergic to penicillins and codeine.  MEDICATIONS:  Current Outpatient Prescriptions  Medication Sig Dispense Refill  . ALPRAZolam (XANAX) 0.5 MG tablet Take 0.5 mg by mouth 3 (three) times daily as needed for anxiety.       . Cholecalciferol (VITAMIN D-3 PO) Take 2,000 Units by mouth daily.      . divalproex (DEPAKOTE) 500 MG DR tablet Take 500 mg by mouth daily.       Marland Kitchen  esomeprazole (NEXIUM) 40 MG capsule Take 40 mg by mouth daily before breakfast.      . fish oil-omega-3 fatty acids 1000 MG capsule Take 1 g by mouth 3 (three) times daily.      Marland Kitchen HYDROcodone-acetaminophen (NORCO) 7.5-325 MG per tablet Take 1 tablet by mouth every 6 (six) hours as needed for pain.      Marland Kitchen ibuprofen (ADVIL,MOTRIN) 800 MG tablet Take 800 mg by mouth every 8 (eight) hours as needed for pain.       Marland Kitchen LORazepam (ATIVAN) 1 MG tablet Take 1 mg by mouth every 8 (eight) hours as needed for anxiety.       . Magnesium 250 MG TABS Take 1 tablet by mouth 4 (four) times daily.      . metoprolol  tartrate (LOPRESSOR) 25 MG tablet Take 25 mg by mouth 2 (two) times daily.      Marland Kitchen olmesartan-hydrochlorothiazide (BENICAR HCT) 20-12.5 MG per tablet Take 0.25 tablets by mouth every morning. 1/4 tablet      . simvastatin (ZOCOR) 10 MG tablet Take 10 mg by mouth at bedtime.      Marland Kitchen warfarin (COUMADIN) 1 MG tablet Take 1 mg by mouth daily. 7mg  daily      . warfarin (COUMADIN) 5 MG tablet Take 5 mg by mouth daily. 7mg  daily      . polyethylene glycol (MIRALAX / GLYCOLAX) packet Take 17 g by mouth daily. For constipation      . pseudoephedrine-guaifenesin (MUCINEX D) 60-600 MG per tablet Take 1 tablet by mouth 2 (two) times daily as needed for congestion.       . Simethicone (GAS-X PO) Take 1-2 tablets by mouth 3 (three) times daily as needed (gas).       . zolpidem (AMBIEN) 10 MG tablet Take 10 mg by mouth at bedtime as needed for sleep.       No current facility-administered medications for this visit.    SURGICAL HISTORY:  Past Surgical History  Procedure Laterality Date  . Abdominal hysterectomy  1996  . Cholecystectomy  2000  . Shoulder surgery  1998    right  . Eye surgery  2001    vision correction  . Foot surgery  2005    left  . Craniotomy  09/15/2011    Procedure: CRANIOTOMY TUMOR EXCISION;  Surgeon: [name], MD;  Location: [facility];  Service: Neurosurgery;  Laterality: N/A;  Craniotomy resection of tumor with stealth  . Cataract extraction  2011    with lens implant     REVIEW OF SYSTEMS:14 point review of system is as in the history above otherwise negative.  PHYSICAL EXAMINATION:  Blood pressure 113/77, pulse 63, temperature 98 F (36.7 C), temperature source Oral, resp. rate 18, weight 186 lb 9.6 oz (84.641 kg). GENERAL: No acute distress. SKIN:  No rashes or significant lesions . No ecchymosis or petechial rash. HEAD: Normocephalic, No masses, lesions, tenderness or abnormalities  EYES: Conjunctiva are pink and non-injected and no jaundice ENT: External  ears normal ,lips, buccal mucosa, and tongue normal and mucous membranes are moist . No evidence of thrush. LYMPH: No palpable lymphadenopathy, in the neck, supraclavicular areas or axilla. LUNGS: Clear to auscultation , no crackles or wheezes HEART: regular rate & rhythm, no murmurs, no gallops, S1 normal and S2 normal and no S3. ABDOMEN: Abdomen soft, non-tender, no masses or organomegaly and no hepatosplenomegaly palpable MSK: No CVA tenderness and no tenderness on percussion of the back or  rib cage. EXTREMITIES: No edema, no skin discoloration or tenderness NEURO: Alert & oriented , no focal motor  Deficits, patient has normal power in 4 extremities.     LABORATORY DATA: Lab Results  Component Value Date   WBC 4.3 10/30/2012   HGB 13.6 10/30/2012   HCT 41.3 10/30/2012   MCV 89.8 10/30/2012   PLT 170 10/30/2012      Chemistry      Component Value Date/Time   NA 138 10/30/2012 1109   NA 140 03/03/2012 1033   K 3.6 10/30/2012 1109   K 3.8 03/03/2012 1033   CL 99 10/30/2012 1109   CL 101 03/03/2012 1033   CO2 32 10/30/2012 1109   CO2 30* 03/03/2012 1033   BUN 13 10/30/2012 1109   BUN 15.0 03/03/2012 1033   CREATININE 0.59 10/30/2012 1109   CREATININE 0.6 03/03/2012 1033      Component Value Date/Time   CALCIUM 9.7 10/30/2012 1109   CALCIUM 9.2 03/03/2012 1033   ALKPHOS 78 10/30/2012 1109   ALKPHOS 115 03/03/2012 1033   AST 18 10/30/2012 1109   AST 20 03/03/2012 1033   ALT 24 10/30/2012 1109   ALT 44 03/03/2012 1033   BILITOT 0.2* 10/30/2012 1109   BILITOT 0.31 03/03/2012 1033       RADIOGRAPHIC STUDIES: Brain W Wo Contrast 10/24/2012  IMPRESSION: Stable and satisfactory postoperative appearance of the brain since 04/28/2012.  No metastatic disease to the brain identified.   Original Report Authenticated By: Erskine Speed, M.D.     ASSESSMENT: Metastatic adenocarcinoma to the brain with unknown primary. Patient has no evidence of disease recurrence at this time.She also has no  evidence of recurrence of brain metastasis She's had 1 of 6 months of anticoagulation.   PLAN:  1. She will return to clinic in 3 months. 2. I ordered a CT chest /abdomen and pelvis with contrast for surveillance prior to next visit 3. Instructed her to stop warfarin and given no evidence of active cancer at this time.  She's been on warfarin for about a year. 4. Patient to discuss with Dr Sherwood Gambler about her leg cramps.    All questions were satisfactorily answered. Patient knows to call if  any concern arises.  I spent more than 50 % counseling the patient face to face. The total time spent in the appointment was 30 minutes.   Sherral Hammers, MD FACP. Hematology/Oncology.

## 2012-11-24 ENCOUNTER — Encounter: Payer: Self-pay | Admitting: Internal Medicine

## 2012-12-07 ENCOUNTER — Other Ambulatory Visit: Payer: Self-pay | Admitting: Radiation Therapy

## 2012-12-07 DIAGNOSIS — C7931 Secondary malignant neoplasm of brain: Secondary | ICD-10-CM

## 2012-12-27 ENCOUNTER — Other Ambulatory Visit (HOSPITAL_COMMUNITY): Payer: Self-pay | Admitting: Internal Medicine

## 2012-12-27 ENCOUNTER — Ambulatory Visit (HOSPITAL_COMMUNITY)
Admission: RE | Admit: 2012-12-27 | Discharge: 2012-12-27 | Disposition: A | Discharge status: Home / Self Care | Payer: BC Managed Care – PPO | Source: Ambulatory Visit | Attending: Internal Medicine | Admitting: Internal Medicine

## 2012-12-27 DIAGNOSIS — X500XXA Overexertion from strenuous movement or load, initial encounter: Secondary | ICD-10-CM | POA: Insufficient documentation

## 2012-12-27 DIAGNOSIS — S8990XA Unspecified injury of unspecified lower leg, initial encounter: Secondary | ICD-10-CM | POA: Insufficient documentation

## 2012-12-27 DIAGNOSIS — M25579 Pain in unspecified ankle and joints of unspecified foot: Secondary | ICD-10-CM | POA: Insufficient documentation

## 2012-12-27 DIAGNOSIS — M25473 Effusion, unspecified ankle: Secondary | ICD-10-CM | POA: Insufficient documentation

## 2012-12-27 DIAGNOSIS — M79609 Pain in unspecified limb: Secondary | ICD-10-CM | POA: Insufficient documentation

## 2012-12-27 DIAGNOSIS — M25476 Effusion, unspecified foot: Secondary | ICD-10-CM | POA: Insufficient documentation

## 2013-01-18 ENCOUNTER — Other Ambulatory Visit: Payer: Self-pay | Admitting: Radiation Therapy

## 2013-01-18 ENCOUNTER — Encounter: Payer: Self-pay | Admitting: Radiation Oncology

## 2013-01-18 ENCOUNTER — Ambulatory Visit
Admission: RE | Admit: 2013-01-18 | Discharge: 2013-01-18 | Disposition: A | Discharge status: Home / Self Care | Payer: BC Managed Care – PPO | Source: Ambulatory Visit | Attending: Radiation Oncology | Admitting: Radiation Oncology

## 2013-01-18 VITALS — BP 105/72 | HR 67 | Temp 98.0°F | Resp 20 | Wt 186.0 lb

## 2013-01-18 DIAGNOSIS — C7931 Secondary malignant neoplasm of brain: Secondary | ICD-10-CM

## 2013-01-18 MED ORDER — GADOBENATE DIMEGLUMINE 529 MG/ML IV SOLN
17.0000 mL | Freq: Once | INTRAVENOUS | Status: AC | PRN
  Administered 2013-01-18: 17 mL via INTRAVENOUS

## 2013-01-18 NOTE — Progress Notes (Signed)
Radiation Oncology         [phone] ________________________________  Name: GEENA WEINHOLD MRN: [id number]  Date: 01/18/2013  DOB: January 24, 1956  Follow-Up Visit Note  CC: [name], MD  Elfredia Nevins, MD  Diagnosis:   Metastatic adenocarcinoma with brain metastasis  Interval Since Last Radiation:  The patient completed radiosurgery in July of 2013   Narrative:  The patient returns today for routine follow-up.  She states that she is doing very well. She denies any recent acute changes/complaints. No headaches, no significant vision changes and no nausea. The patient had a recent MRI scan of the brain and presents today in clinic to review this. She saw neurosurgery 3 months ago and had a good scan at that time.                              ALLERGIES:  is allergic to penicillins and codeine.  Meds: Current Outpatient Prescriptions  Medication Sig Dispense Refill  . ALPRAZolam (XANAX) 0.5 MG tablet Take 0.5 mg by mouth 3 (three) times daily as needed for anxiety.       . Cholecalciferol (VITAMIN D-3 PO) Take 2,000 Units by mouth daily.      Marland Kitchen esomeprazole (NEXIUM) 40 MG capsule Take 40 mg by mouth daily before breakfast.      . fish oil-omega-3 fatty acids 1000 MG capsule Take 1 g by mouth 3 (three) times daily.      Marland Kitchen HYDROcodone-acetaminophen (NORCO) 7.5-325 MG per tablet Take 1 tablet by mouth every 6 (six) hours as needed for pain.      Marland Kitchen ibuprofen (ADVIL,MOTRIN) 800 MG tablet Take 800 mg by mouth every 8 (eight) hours as needed for pain.       Marland Kitchen LORazepam (ATIVAN) 1 MG tablet Take 1 mg by mouth every 8 (eight) hours as needed for anxiety.       . metoprolol tartrate (LOPRESSOR) 25 MG tablet Take 25 mg by mouth 2 (two) times daily.      Marland Kitchen olmesartan-hydrochlorothiazide (BENICAR HCT) 20-12.5 MG per tablet Take 0.25 tablets by mouth every morning. 1/4 tablet      . polyethylene glycol (MIRALAX / GLYCOLAX) packet Take 17 g by mouth daily. For constipation      .  simvastatin (ZOCOR) 10 MG tablet Take 10 mg by mouth at bedtime.      Marland Kitchen zolpidem (AMBIEN) 10 MG tablet Take 10 mg by mouth at bedtime as needed for sleep.      . divalproex (DEPAKOTE) 500 MG DR tablet Take 500 mg by mouth daily.       . Magnesium 250 MG TABS Take 1 tablet by mouth 4 (four) times daily.      Marland Kitchen warfarin (COUMADIN) 5 MG tablet Take 5 mg by mouth daily. 7mg  daily       No current facility-administered medications for this encounter.    Physical Findings: The patient is in no acute distress. Patient is alert and oriented.  weight is 186 lb (84.369 kg). Her oral temperature is 98 F (36.7 C). Her blood pressure is 105/72 and her pulse is 67. Her respiration is 20. Marland Kitchen   General: Well-developed, in no acute distress HEENT: Normocephalic, atraumatic Cardiovascular: Regular rate and rhythm Respiratory: Clear to auscultation bilaterally GI: Soft, nontender, normal bowel sounds Extremities: No edema present   Lab Findings: Lab Results  Component Value Date   WBC 4.3 10/30/2012   HGB 13.6  10/30/2012   HCT 41.3 10/30/2012   MCV 89.8 10/30/2012   PLT 170 10/30/2012     Radiographic Findings: Dg Ankle Complete Right  12/27/2012   CLINICAL DATA:  Larey Seat 2 days ago twisting right ankle, now pain swelling and bruising at lateral ankle  EXAM: RIGHT ANKLE - COMPLETE 3+ VIEW  COMPARISON:  None  FINDINGS: Osseous mineralization normal.  Ankle mortise intact.  Soft tissue swelling diffusely greatest laterally.  No acute fracture, dislocation, or bone destruction.  IMPRESSION: No acute osseous abnormalities.   Electronically Signed   By: Ulyses Southward M.D.   On: 12/27/2012 15:19   Mr Laqueta Jean RU Contrast  01/18/2013   CLINICAL DATA:  57 year old female with metastatic adenocarcinoma. Intracranial metastatic disease treated with surgery and radiation. Restaging. Subsequent encounter.  EXAM: MRI HEAD WITHOUT AND WITH CONTRAST  TECHNIQUE: Multiplanar, multiecho pulse sequences of the brain and  surrounding structures were obtained according to standard protocol without and with intravenous contrast  CONTRAST:  17mL MULTIHANCE GADOBENATE DIMEGLUMINE 529 MG/ML IV SOLN  COMPARISON:  10/24/2012 and earlier.  FINDINGS: As on the most recent comparison, no abnormal enhancement identified. Stable nonenhancing small left superior frontal gyrus resection cavity with mild chronic blood products and Stable overlying craniotomy changes.  Stable and normal for age gray and white matter signal elsewhere throughout the brain. No midline shift, mass effect, or evidence of intracranial mass lesion.  Stable cerebral volume. No restricted diffusion or evidence of acute infarction. No ventriculomegaly. No acute intracranial hemorrhage identified. Stable pituitary, cervicomedullary junction and visualized cervical spine. Normal bone marrow signal. Major intracranial vascular flow voids are stable. Visible internal auditory structures appear normal.  Visualized paranasal sinuses and mastoids are clear. Stable orbits soft tissues. Stable scalp soft tissues.  IMPRESSION: Stable and satisfactory post treatment appearance of the brain. No metastatic disease identified. No new intracranial abnormality.   Electronically Signed   By: Augusto Gamble M.D.   On: 01/18/2013 13:06   Dg Toe Great Left  12/27/2012   CLINICAL DATA:  Larey Seat Monday twisting ankle, pain and bruising left great toe  EXAM: LEFT GREAT TOE  COMPARISON:  None  FINDINGS: Osseous mineralization normal.  Joint spaces preserved.  No fracture, dislocation, or bone destruction.  IMPRESSION: No acute abnormalities.   Electronically Signed   By: Ulyses Southward M.D.   On: 12/27/2012 15:18    Impression:    The patient is doing well clinically. Her MRI scan looked good without any concerning findings. She is due for systemic scans later this month and is followed by medical oncology at [facility].  Plan:  The patient will continue brain MRI scans every 3 months and we  will continue to alternate with this with neurosurgery  I spent 15 minutes with the patient today, the majority of which was spent counseling the patient on the diagnosis of cancer and coordinating care.   Radene Gunning, M.D., Ph.D.

## 2013-01-18 NOTE — Progress Notes (Signed)
Follow up [facility] brain 10/05/1313 Gy, no c/o head aches, vision changes, nausea, no c/o pain but some nights has cramps in hands and lefgs/feet, wakes her up,  Has Ct scheduled 02/01/13, last seen Dr. Newell Coral 10/27/12, off depakote  For 3 months now,

## 2013-01-19 ENCOUNTER — Other Ambulatory Visit: Payer: BC Managed Care – PPO

## 2013-01-19 ENCOUNTER — Other Ambulatory Visit: Payer: Self-pay | Admitting: Radiation Therapy

## 2013-01-22 ENCOUNTER — Ambulatory Visit: Payer: BC Managed Care – PPO | Admitting: Radiation Oncology

## 2013-02-01 ENCOUNTER — Encounter (HOSPITAL_COMMUNITY): Payer: BC Managed Care – PPO | Attending: Hematology and Oncology

## 2013-02-01 ENCOUNTER — Ambulatory Visit (HOSPITAL_COMMUNITY)
Admission: RE | Admit: 2013-02-01 | Discharge: 2013-02-01 | Disposition: A | Discharge status: Home / Self Care | Payer: BC Managed Care – PPO | Source: Ambulatory Visit | Attending: Hematology and Oncology | Admitting: Hematology and Oncology

## 2013-02-01 DIAGNOSIS — C7931 Secondary malignant neoplasm of brain: Secondary | ICD-10-CM

## 2013-02-01 DIAGNOSIS — Z09 Encounter for follow-up examination after completed treatment for conditions other than malignant neoplasm: Secondary | ICD-10-CM | POA: Insufficient documentation

## 2013-02-01 DIAGNOSIS — C801 Malignant (primary) neoplasm, unspecified: Secondary | ICD-10-CM | POA: Insufficient documentation

## 2013-02-01 LAB — CBC WITH DIFFERENTIAL/PLATELET
Basophils Absolute: 0.1 10*3/uL (ref 0.0–0.1)
Eosinophils Absolute: 0.1 10*3/uL (ref 0.0–0.7)
Eosinophils Relative: 2 % (ref 0–5)
Hemoglobin: 13.9 g/dL (ref 12.0–15.0)
Lymphs Abs: 2.1 10*3/uL (ref 0.7–4.0)
MCH: 30.2 pg (ref 26.0–34.0)
MCV: 89.8 fL (ref 78.0–100.0)
Neutro Abs: 3.3 10*3/uL (ref 1.7–7.7)
Neutrophils Relative %: 54 % (ref 43–77)
Platelets: 239 10*3/uL (ref 150–400)
RBC: 4.61 MIL/uL (ref 3.87–5.11)
RDW: 13.4 % (ref 11.5–15.5)
WBC: 6.1 10*3/uL (ref 4.0–10.5)

## 2013-02-01 LAB — COMPREHENSIVE METABOLIC PANEL
AST: 21 U/L (ref 0–37)
Alkaline Phosphatase: 98 U/L (ref 39–117)
BUN: 17 mg/dL (ref 6–23)
CO2: 29 mEq/L (ref 19–32)
Calcium: 10.1 mg/dL (ref 8.4–10.5)
Chloride: 99 mEq/L (ref 96–112)
Creatinine, Ser: 0.58 mg/dL (ref 0.50–1.10)
GFR calc non Af Amer: >90 mL/min (ref >90)
Glucose, Bld: 112 mg/dL — ABNORMAL HIGH (ref 70–99)
Total Bilirubin: 0.3 mg/dL (ref 0.3–1.2)

## 2013-02-01 MED ORDER — IOHEXOL 300 MG/ML  SOLN
100.0000 mL | Freq: Once | INTRAMUSCULAR | Status: AC | PRN
  Administered 2013-02-01: 100 mL via INTRAVENOUS

## 2013-02-01 NOTE — Progress Notes (Signed)
Labs drawn today for cbc/diff,cmp 

## 2013-02-04 NOTE — Progress Notes (Signed)
Haley Smiles., MD [address] [place] [address]  Metastatic adenocarcinoma to brain - Plan: CBC with Differential, Comprehensive metabolic panel, CT Abdomen Pelvis W Contrast, CT Chest W Contrast  CURRENT THERAPY: Surveillance per NCCN guidelines  INTERVAL HISTORY: Haley Lowe 57 y.o. 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 S/P surgery followed by radiation therapy as well as chemotherapy consisting of carboplatin/paclitaxel x6 cycles (11/12/11- 02/25/2012). Presently she is been observed and presently has no evidence of obvious disease by MRI scan of the brain or CT scans.  Haley Lowe underwent an MRI of brain on 01/18/2013 which was stable and satisfactory post treatment appearance of the brain. No metastatic disease identified. No new intracranial abnormality.  Additionally, on 02/01/2013, she underwent CT CAP which demonstrates: 1. Stable CT of the chest.  2. No significant change and a small, nonspecific pulmonary nodules.  3. Stable CT of the abdomen and pelvis. There are no specific features identified to suggest metastatic disease.  I personally reviewed and went over radiographic studies with the patient.  I personally reviewed and went over laboratory results with the patient.  Labs from 02/01/2013 shows a CBC WNL and a metabolic panel that is unremarkable.  She underwent port removal and IVC filter retrieval by IR on 08/10/2012.  She is off anticoagulation.  It is believed that her hypercoagulability was secondary to malignancy, but she does not have any measurable disease and is in remission from her cancer.  Therefore, she is off anticoagulation.  If she were to have a recurrence, she will likely require lifelong anticoagulation and may be a candidate for Xarelto.  Haley Lowe is thought to have lung primary disease and therefore, we will follow NCCN guidelines and adapt  these recommendations clinically.  NCCN guidelines for Non-Small Cell Lung Cancer Surveillance are as follows:  A. H+P every 6-12 months  B. CT chest every 6-12 months x 2 years and then annually   C. PET scan not typically indicated for surveillance  She denies any complaints today.  She reports that her last mammogram was in Jan 2014, but it was not performed at [facility].  She is reminded that she will be due for her next screening mammogram in jan 2015.  She admits that she already received her influenza vaccine this years.    Oncologically, she denies any complaints and ROS questioning is negative.   Past Medical History  Diagnosis Date  . Hypertension   . PONV (postoperative nausea and vomiting)   . Anxiety   . Depression   . COPD (chronic obstructive pulmonary disease)   . GERD (gastroesophageal reflux disease)   . Headache(784.0)   . Arthritis   . Adenocarcinoma 09/28/2011  . Hx of adenomatous colonic polyps   . Pulmonary nodules   . Diabetes mellitus   . Emphysema   . 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 Allergy     hips and shoulders  . Cataract     1 lens implaqnt right eye,intact on left eye  . Goiter     nodule watching  . Fibromyalgia     neuropathy tops of both feet  . Metastasis to brain 09/15/11     left frontoparietal region  . History of radiation therapy 10/06/11    1 fraction  15 gray  . Pulmonary emboli 11/04/11    right upper lobe  and r lower lobe PE  . Lung nodule seen on imaging study 11/04/11 CT    4mm LLL  . DVT (deep venous thrombosis) 12/09/2011  . Pulmonary embolism 12/09/2011  . Hyperlipidemia   . History of radiation therapy 10/06/11    SRS 15Gy 25fx, brain    has Adenocarcinoma; Pulmonary nodules; Metastatic adenocarcinoma to brain; Allergy; Fibromyalgia; COPD with asthma; DVT (deep venous thrombosis); Pulmonary embolism; and Occlusion and stenosis of carotid artery without mention of cerebral infarction on her  problem list.     is allergic to penicillins and codeine.  [name] does not currently have medications on file.  Past Surgical History  Procedure Laterality Date  . Abdominal hysterectomy  1996  . Cholecystectomy  2000  . Shoulder surgery  1998    right  . Eye surgery  2001    vision correction  . Foot surgery  2005    left  . Craniotomy  09/15/2011    Procedure: CRANIOTOMY TUMOR EXCISION;  Surgeon: [name], MD;  Location: [facility];  Service: Neurosurgery;  Laterality: N/A;  Craniotomy resection of tumor with stealth  . Cataract extraction  2011    with lens implant    Denies any headaches, dizziness, double vision, fevers, chills, night sweats, nausea, vomiting, diarrhea, constipation, chest pain, heart palpitations, shortness of breath, blood in stool, black tarry stool, urinary pain, urinary burning, urinary frequency, hematuria.   PHYSICAL EXAMINATION  ECOG PERFORMANCE STATUS: 0 - Asymptomatic  Filed Vitals:   02/05/13 0900  BP: 125/85  Pulse: 82  Temp: 98.2 F (36.8 C)  Resp: 16    GENERAL:alert, healthy, no distress, well nourished, well developed, comfortable, cooperative, obese and smiling SKIN: skin color, texture, turgor are normal, no rashes or significant lesions HEAD: Normocephalic, No masses, lesions, tenderness or abnormalities EYES: normal, PERRLA, EOMI, Conjunctiva are pink and non-injected EARS: External ears normal OROPHARYNX:mucous membranes are moist  NECK: supple, no adenopathy, thyroid normal size, non-tender, without nodularity, no stridor, non-tender, trachea midline, increased supraclavicular fat pads LYMPH:  no palpable lymphadenopathy, no hepatosplenomegaly BREAST:not examined LUNGS: clear to auscultation and percussion HEART: regular rate & rhythm, no murmurs, no gallops, S1 normal and S2 normal ABDOMEN:abdomen soft, non-tender, obese, normal bowel sounds, no masses or organomegaly, truncal obesity noted hindering examination  hepatosplenomegaly BACK: Back symmetric, no curvature., No CVA tenderness EXTREMITIES:less then 2 second capillary refill, no joint deformities, effusion, or inflammation, no edema, no skin discoloration, no clubbing, no cyanosis  NEURO: alert & oriented x 3 with fluent speech, no focal motor/sensory deficits, gait normal   LABORATORY DATA: CBC    Component Value Date/Time   WBC 6.1 02/01/2013 0936   WBC 14.4* 03/03/2012 1033   RBC 4.61 02/01/2013 0936   RBC 3.61* 03/03/2012 1033   HGB 13.9 02/01/2013 0936   HGB 11.8 03/03/2012 1033   HCT 41.4 02/01/2013 0936   HCT 35.1 03/03/2012 1033   PLT 239 02/01/2013 0936   PLT 161 03/03/2012 1033   MCV 89.8 02/01/2013 0936   MCV 97.2 03/03/2012 1033   MCH 30.2 02/01/2013 0936   MCH 32.7 03/03/2012 1033   MCHC 33.6 02/01/2013 0936   MCHC 33.6 03/03/2012 1033   RDW 13.4 02/01/2013 0936   RDW 15.1* 03/03/2012 1033   LYMPHSABS 2.1 02/01/2013 0936   LYMPHSABS 2.4 03/03/2012 1033   MONOABS 0.6 02/01/2013 0936   MONOABS 1.1* 03/03/2012 1033   EOSABS 0.1 02/01/2013 0936   EOSABS 0.2 03/03/2012 1033   BASOSABS  0.1 02/01/2013 0936   BASOSABS 0.1 03/03/2012 1033      Chemistry      Component Value Date/Time   NA 139 02/01/2013 0936   NA 140 03/03/2012 1033   K 3.9 02/01/2013 0936   K 3.8 03/03/2012 1033   CL 99 02/01/2013 0936   CL 101 03/03/2012 1033   CO2 29 02/01/2013 0936   CO2 30* 03/03/2012 1033   BUN 17 02/01/2013 0936   BUN 15.0 03/03/2012 1033   CREATININE 0.58 02/01/2013 0936   CREATININE 0.6 03/03/2012 1033      Component Value Date/Time   CALCIUM 10.1 02/01/2013 0936   CALCIUM 9.2 03/03/2012 1033   ALKPHOS 98 02/01/2013 0936   ALKPHOS 115 03/03/2012 1033   AST 21 02/01/2013 0936   AST 20 03/03/2012 1033   ALT 37* 02/01/2013 0936   ALT 44 03/03/2012 1033   BILITOT 0.3 02/01/2013 0936   BILITOT 0.31 03/03/2012 1033       RADIOGRAPHIC STUDIES:  02/01/2013  CLINICAL DATA: Metastatic adenocarcinoma.  EXAM:    CT CHEST, ABDOMEN, AND PELVIS WITH CONTRAST  TECHNIQUE:  Multidetector CT imaging of the chest, abdomen and pelvis was  performed following the standard protocol during bolus  administration of intravenous contrast.  CONTRAST: OMNIPAQUE IOHEXOL 300 MG/ML SOLN  COMPARISON: PET-CT 07/26/2012  FINDINGS:  CT CHEST FINDINGS  There is no pleural effusion identified. Moderate changes of  centrilobular emphysema identified. There is a nodule within the  right upper lobe measuring 4 mm, image 32/ series 3. This is  unchanged from previous exam. Pulmonary nodule in the left lower  lobe is unchanged measuring 4 mm. Stable right lower lobe nodule  measuring 4 mm, image 34/ series 3. No new or progressive pulmonary  nodularity identified. Heart size appears normal. No pericardial  effusion. No mediastinal or hilar adenopathy. There is no enlarged  axillary or supraclavicular adenopathy.  The esophagus appears normal. Review of the visualized osseous  structures is significant for mild multilevel spondylosis. No  aggressive lytic or sclerotic bone lesions identified.  CT ABDOMEN AND PELVIS FINDINGS  There is a small focus of low attenuation within the caudate lobe of  liver measuring 7 mm, image 53/ series 2. This is unchanged from  previous exam. Prior cholecystectomy. No biliary dilatation. Normal  appearance of the pancreas. The spleen is normal.  The adrenal glands are both normal. Normal appearance of the right  kidney. The left kidney is normal. The urinary bladder is within  normal limits.  The stomach is normal. The small bowel loops have a normal course  and caliber without obstruction. Normal appearance of the colon.  Review of the visualized osseous structures is negative for  aggressive lytic or sclerotic bone lesion.  IMPRESSION:  1. Stable CT of the chest.  2. No significant change and a small, nonspecific pulmonary nodules.  3. Stable CT of the abdomen and pelvis. There  are no specific  features identified to suggest metastatic disease.  Electronically Signed  By: Signa Kell M.D.  On: 02/01/2013 11:33   01/18/2013  CLINICAL DATA: 57 year old female with metastatic adenocarcinoma.  Intracranial metastatic disease treated with surgery and radiation.  Restaging. Subsequent encounter.  EXAM:  MRI HEAD WITHOUT AND WITH CONTRAST  TECHNIQUE:  Multiplanar, multiecho pulse sequences of the brain and surrounding  structures were obtained according to standard protocol without and  with intravenous contrast  CONTRAST: 17mL MULTIHANCE GADOBENATE DIMEGLUMINE 529 MG/ML IV SOLN  COMPARISON: 10/24/2012 and earlier.  FINDINGS:  As on the most recent comparison, no abnormal enhancement  identified. Stable nonenhancing small left superior frontal gyrus  resection cavity with mild chronic blood products and Stable  overlying craniotomy changes.  Stable and normal for age gray and white matter signal elsewhere  throughout the brain. No midline shift, mass effect, or evidence of  intracranial mass lesion.  Stable cerebral volume. No restricted diffusion or evidence of acute  infarction. No ventriculomegaly. No acute intracranial hemorrhage  identified. Stable pituitary, cervicomedullary junction and  visualized cervical spine. Normal bone marrow signal. Major  intracranial vascular flow voids are stable. Visible internal  auditory structures appear normal.  Visualized paranasal sinuses and mastoids are clear. Stable orbits  soft tissues. Stable scalp soft tissues.  IMPRESSION:  Stable and satisfactory post treatment appearance of the brain. No  metastatic disease identified. No new intracranial abnormality.  Electronically Signed  By: Augusto Gamble M.D.  On: 01/18/2013 13:06    PATHOLOGY: Immunophenotype of the lesion is as follows;  Cytokeratin 7 - strong diffuse expression.  Cytokeratin AE1/AE3 - strong diffuse expression.  Cytokeratin 20 - negative  expression.  CDX-2 - negative expression.  TTF-1 - negative expression.  Napsin A - negative expression.  Estrogen receptor - negative expression.  GFAP - negative expression.  GCDFP - negative expression.  Mucicarmine - patchy strong expression.    ASSESSMENT:  1.  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 S/P surgery followed by radiation therapy as well as chemotherapy consisting of carboplatin/paclitaxel x6 cycles (11/12/11- 02/25/2012). Presently she is been observed and presently has no evidence of obvious disease by MRI scan of the brain or CT scans. 2. DVT left leg with pulmonary embolus, S/P >1 year worth of anticoagulation. Thought to be secondary to malignancy which is now in remission and she stopped Coumadin in July 2014. 3. S/P Port-A-Cath removal on 08/10/2012 4. S/P IVC filter removed on 08/10/2012 5. Chronic constipation from poor diet and medications 6. Chronic pain in her legs and feet which is probably neuropathic. She did not do well with gabapentin and doesn't want to try that again. She is far better when she is sitting in a chair.   Patient Active Problem List   Diagnosis Date Noted  . Occlusion and stenosis of carotid artery without mention of cerebral infarction 07/18/2012  . DVT (deep venous thrombosis) 12/09/2011  . Pulmonary embolism 12/09/2011  . COPD with asthma 10/18/2011  . Allergy   . Fibromyalgia   . Pulmonary nodules   . Metastatic adenocarcinoma to brain   . Adenocarcinoma 09/28/2011     PLAN:  1. I personally reviewed and went over laboratory results with the patient. 2. I personally reviewed and went over radiographic studies with the patient. 3. Recommended Mammography this year.  Last exam was in July 2013. 4. We reviewed NCCN guidelines for NSCLC.  We will adapt these recommendations accordingly. 5. Labs in 3 months: CBC diff, CMET, Hgb A1c 6. Influenza vaccine already given 7. CT CAP in 6 months.  Orders  placed 8. Defer brain imaging to Neurosurgery and Rad Onc 9. Return in 3 months for follow-up.   THERAPY PLAN:  It is reasonable to perform CT scans every 6 months in this patient and utilize PET scan for any worrisome findings or changes on CT imaging.  We will adapt NCCN guidelines for NSCLC since she is thought to have an adenocarcinoma of lung primary but not diagnosed clearly. NCCN guidelines for Non-Small Cell Lung  Cancer Surveillance are as follows:  A. H+P every 6-12 months  B. CT chest every 6-12 months x 2 years and then annually   C. PET scan not typically indicated for surveillance   All questions were answered. The patient knows to call the clinic with any problems, questions or concerns. We can certainly see the patient much sooner if necessary.  Patient and plan discussed with Dr. Alla German and he is in agreement with the aforementioned.   KEFALAS,THOMAS

## 2013-02-05 ENCOUNTER — Encounter (HOSPITAL_COMMUNITY): Payer: Self-pay | Admitting: Oncology

## 2013-02-05 ENCOUNTER — Encounter (HOSPITAL_COMMUNITY): Payer: BC Managed Care – PPO | Attending: Oncology | Admitting: Oncology

## 2013-02-05 VITALS — BP 125/85 | HR 82 | Temp 98.2°F | Resp 16 | Wt 188.7 lb

## 2013-02-05 DIAGNOSIS — R739 Hyperglycemia, unspecified: Secondary | ICD-10-CM

## 2013-02-05 DIAGNOSIS — C7931 Secondary malignant neoplasm of brain: Secondary | ICD-10-CM

## 2013-02-05 DIAGNOSIS — R7309 Other abnormal glucose: Secondary | ICD-10-CM | POA: Insufficient documentation

## 2013-02-05 DIAGNOSIS — G8929 Other chronic pain: Secondary | ICD-10-CM

## 2013-02-05 DIAGNOSIS — C801 Malignant (primary) neoplasm, unspecified: Secondary | ICD-10-CM

## 2013-02-05 DIAGNOSIS — K5900 Constipation, unspecified: Secondary | ICD-10-CM

## 2013-02-05 NOTE — Patient Instructions (Signed)
[facility] Discharge Instructions  RECOMMENDATIONS MADE BY THE CONSULTANT AND ANY TEST RESULTS WILL BE SENT TO YOUR REFERRING PHYSICIAN.  Lab appointment in 3 months. MD appointment in 3 months. CT scans in 6 months. MD appointment after CT scans as well.  Thank you for choosing [facility] to provide your oncology and hematology care.  To afford each patient quality time with our providers, please arrive at least 15 minutes before your scheduled appointment time.  With your help, our goal is to use those 15 minutes to complete the necessary work-up to ensure our physicians have the information they need to help with your evaluation and healthcare recommendations.    Effective January 1st, 2014, we ask that you re-schedule your appointment with our physicians should you arrive 10 or more minutes late for your appointment.  We strive to give you quality time with our providers, and arriving late affects you and other patients whose appointments are after yours.    Again, thank you for choosing [facility].  Our hope is that these requests will decrease the amount of time that you wait before being seen by our physicians.       _____________________________________________________________  Should you have questions after your visit to [facility], please contact our office at [phone] between the hours of 8:30 a.m. and 5:00 p.m.  Voicemails left after 4:30 p.m. will not be returned until the following business day.  For prescription refill requests, have your pharmacy contact our office with your prescription refill request.

## 2013-02-08 ENCOUNTER — Other Ambulatory Visit: Payer: Self-pay

## 2013-04-05 DIAGNOSIS — C73 Malignant neoplasm of thyroid gland: Secondary | ICD-10-CM

## 2013-04-05 HISTORY — DX: Malignant neoplasm of thyroid gland: C73

## 2013-04-30 ENCOUNTER — Other Ambulatory Visit: Payer: BC Managed Care – PPO

## 2013-05-07 ENCOUNTER — Encounter (HOSPITAL_COMMUNITY): Payer: BC Managed Care – PPO | Attending: Oncology

## 2013-05-07 DIAGNOSIS — R739 Hyperglycemia, unspecified: Secondary | ICD-10-CM

## 2013-05-07 DIAGNOSIS — C7949 Secondary malignant neoplasm of other parts of nervous system: Principal | ICD-10-CM

## 2013-05-07 DIAGNOSIS — C7931 Secondary malignant neoplasm of brain: Secondary | ICD-10-CM | POA: Insufficient documentation

## 2013-05-07 DIAGNOSIS — R7309 Other abnormal glucose: Secondary | ICD-10-CM | POA: Insufficient documentation

## 2013-05-07 LAB — COMPREHENSIVE METABOLIC PANEL
ALK PHOS: 93 U/L (ref 39–117)
ALT: 50 U/L — ABNORMAL HIGH (ref 0–35)
AST: 25 U/L (ref 0–37)
Albumin: 3.6 g/dL (ref 3.5–5.2)
BUN: 14 mg/dL (ref 6–23)
CALCIUM: 9.2 mg/dL (ref 8.4–10.5)
CO2: 27 meq/L (ref 19–32)
Chloride: 104 mEq/L (ref 96–112)
Creatinine, Ser: 0.59 mg/dL (ref 0.50–1.10)
GFR calc Af Amer: >90 mL/min (ref >90)
GFR calc non Af Amer: >90 mL/min (ref >90)
Glucose, Bld: 162 mg/dL — ABNORMAL HIGH (ref 70–99)
Potassium: 4.2 mEq/L (ref 3.7–5.3)
SODIUM: 142 meq/L (ref 137–147)
TOTAL PROTEIN: 6.9 g/dL (ref 6.0–8.3)
Total Bilirubin: 0.2 mg/dL — ABNORMAL LOW (ref 0.3–1.2)

## 2013-05-07 LAB — CBC WITH DIFFERENTIAL/PLATELET
Basophils Absolute: 0 10*3/uL (ref 0.0–0.1)
Basophils Relative: 0 % (ref 0–1)
EOS ABS: 0.1 10*3/uL (ref 0.0–0.7)
Eosinophils Relative: 2 % (ref 0–5)
HCT: 39.4 % (ref 36.0–46.0)
Hemoglobin: 12.9 g/dL (ref 12.0–15.0)
LYMPHS ABS: 1.4 10*3/uL (ref 0.7–4.0)
LYMPHS PCT: 30 % (ref 12–46)
MCH: 29 pg (ref 26.0–34.0)
MCHC: 32.7 g/dL (ref 30.0–36.0)
MCV: 88.5 fL (ref 78.0–100.0)
Monocytes Absolute: 0.4 10*3/uL (ref 0.1–1.0)
Monocytes Relative: 9 % (ref 3–12)
NEUTROS PCT: 59 % (ref 43–77)
Neutro Abs: 2.8 10*3/uL (ref 1.7–7.7)
PLATELETS: 242 10*3/uL (ref 150–400)
RBC: 4.45 MIL/uL (ref 3.87–5.11)
RDW: 12.9 % (ref 11.5–15.5)
WBC: 4.7 10*3/uL (ref 4.0–10.5)

## 2013-05-07 LAB — HEMOGLOBIN A1C
Hgb A1c MFr Bld: 7.2 % — ABNORMAL HIGH (ref <5.7)
Mean Plasma Glucose: 160 mg/dL — ABNORMAL HIGH (ref <117)

## 2013-05-07 NOTE — Progress Notes (Signed)
Labs drawn today for cbc/diff,[id number]

## 2013-05-09 ENCOUNTER — Other Ambulatory Visit: Payer: Self-pay | Admitting: Vascular Surgery

## 2013-05-09 ENCOUNTER — Encounter (HOSPITAL_COMMUNITY): Payer: Self-pay | Admitting: Oncology

## 2013-05-09 DIAGNOSIS — I6529 Occlusion and stenosis of unspecified carotid artery: Secondary | ICD-10-CM

## 2013-05-09 NOTE — Progress Notes (Signed)
[name], MD [address] [place] [address]  Metastatic adenocarcinoma to brain, unknown primary - Plan: olmesartan (BENICAR) 20 MG tablet, NM PET Image Restag (PS) Skull Base To Thigh  Constipation  Increased frequency of urination  CURRENT THERAPY: Surveillance per NCCN guidelines  INTERVAL HISTORY: Haley Lowe 58 y.o. 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 S/P surgery followed by radiation therapy as well as chemotherapy consisting of carboplatin/paclitaxel x6 cycles (11/12/11- 02/25/2012).  She was offered maintenance Alimta which she has declined. Presently she is been observed and presently has no evidence of obvious disease by MRI scan of the brain or CT scans.    Metastatic adenocarcinoma to brain, unknown primary   09/15/2011 Initial Diagnosis Metastatic adenocarcinoma to brain, unknown primary.  Resection from frontal brain lesion   09/30/2011 Imaging PET scan shows lung lesion and liver lesion   10/13/2011 Imaging Negative EGD with negative biopsies.   11/12/2011 - 02/25/2012 Chemotherapy Carboplatin/Paclitaxel x 6 cycles.  Patient declined maintenance Alimta x 6 cycles   01/06/2012 Remission PET scan  demonstrates remission   07/26/2012 Remission PET scan demonstrates continued remission   I personally reviewed and went over laboratory results with the patient.  The results are noted within this dictation.  She underwent port removal and IVC filter retrieval by IR on 08/10/2012. She is off anticoagulation. It is believed that her hypercoagulability was secondary to malignancy, but she does not have any measurable disease and is in remission from her cancer. Therefore, she is off anticoagulation. If she were to have a recurrence, she will likely require lifelong anticoagulation and may be a candidate for Xarelto.   The patient is accompanied by her daughter who is  concerned about performing CT scan surveillances. The daughter presented the patient's entire oncologic history to me which demonstrated a negative CAT scan at time of diagnosis, but a positive PET scan for malignancy. She is concerned that since CAT scans missed the patient's initial lung lesion in the PET scan found that , but that may happen in the future if we only perform CT surveillance. The patient and her daughter were educated about the NCCN guidelines regarding surveillance for unknown primary cancer and non-small cell lung cancer, which does not indicate regular PET scan surveillance. With this being said however, the daughter is very concerned and I suspect the concern is translated to the patient as well. As a result, I will set the patient up for a PET scan every 18 months, sooner if necessary depending on CT scan results. Additionally, we'll make sure PET scans are approved for her insurance prior to the test. I think this is a reasonable compromise given the patient's initial presentation. The patient was educated regarding the potential complications that may be encountered with surveillance PET scans including unnecessary biopsies and confusing results. She is willing to accept these risks.  From an oncology standpoint, the patient denies any complaints of ROS questioning is negative.  She does note some increased indigestion. She is taking Nexium with mild relief. I recommended trying dexilant but the patient has declined do to it not being generic. The patient's daughter may be able to supply the patient with samples of dexilant. Another option is to switch her to Prilosec and she will consider this option.  The patient also notes increased urination at bedtime. She notes that she is drinking as much waters  she is able to. I've recommended that she stop drinking after 8 o'clock p.m. and only drink when thirsty.  This may help her urination at hour of sleep. Additionally, she is on HCTZ for  blood pressure control and this may be contributing as well.  She notes some constipation. She is using Dulcolax and MiraLAX. I've recommended she consider Metamucil to help increase fiber supplementation.   Past Medical History  Diagnosis Date  . Hypertension   . PONV (postoperative nausea and vomiting)   . Anxiety   . Depression   . COPD (chronic obstructive pulmonary disease)   . GERD (gastroesophageal reflux disease)   . Headache(784.0)   . Arthritis   . Adenocarcinoma 09/28/2011  . Hx of adenomatous colonic polyps   . Pulmonary nodules   . Diabetes mellitus   . Emphysema   . 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 Allergy     hips and shoulders  . Cataract     1 lens implaqnt right eye,intact on left eye  . Goiter     nodule watching  . Fibromyalgia     neuropathy tops of both feet  . Metastasis to brain 09/15/11     left frontoparietal region  . History of radiation therapy 10/06/11    1 fraction  15 gray  . Pulmonary emboli 11/04/11    right upper lobe and r lower lobe PE  . Lung nodule seen on imaging study 11/04/11 CT    55mm LLL  . DVT (deep venous thrombosis) 12/09/2011  . Pulmonary embolism 12/09/2011  . Hyperlipidemia   . History of radiation therapy 10/06/11    SRS 15Gy 41fx, brain  . Metastatic adenocarcinoma to brain, unknown primary     has Pulmonary nodules; Metastatic adenocarcinoma to brain, unknown primary; Allergy; Fibromyalgia; COPD with asthma; DVT (deep venous thrombosis); Pulmonary embolism; and Occlusion and stenosis of carotid artery without mention of cerebral infarction on her problem list.     is allergic to penicillins and codeine.  [name] had no medications administered during this visit.  Past Surgical History  Procedure Laterality Date  . Abdominal hysterectomy  1996  . Cholecystectomy  2000  . Shoulder surgery  1998    right  . Eye surgery  2001    vision correction  . Foot surgery  2005    left  .  Craniotomy  09/15/2011    Procedure: CRANIOTOMY TUMOR EXCISION;  Surgeon: Hosie Spangle, MD;  Location: [facility];  Service: Neurosurgery;  Laterality: N/A;  Craniotomy resection of tumor with stealth  . Cataract extraction  2011    with lens implant    Denies any headaches, dizziness, double vision, fevers, chills, night sweats, nausea, vomiting, diarrhea, constipation, chest pain, heart palpitations, shortness of breath, blood in stool, black tarry stool, urinary pain, urinary burning, urinary frequency, hematuria.   PHYSICAL EXAMINATION  ECOG PERFORMANCE STATUS: 0 - Asymptomatic  Filed Vitals:   05/10/13 1209  BP: 113/72  Pulse: 67  Temp: 98.5 F (36.9 C)  Resp: 16    GENERAL:alert, no distress, well nourished, well developed, comfortable, cooperative, obese and smiling SKIN: skin color, texture, turgor are normal, no rashes or significant lesions HEAD: Normocephalic, No masses, lesions, tenderness or abnormalities EYES: normal, PERRLA, EOMI, Conjunctiva are pink and non-injected EARS: External ears normal OROPHARYNX:lips, buccal mucosa, and tongue normal and mucous membranes are moist  NECK: supple, no adenopathy, thyroid normal size, non-tender, without nodularity, no  stridor, non-tender, trachea midline LYMPH:  no palpable lymphadenopathy, no hepatosplenomegaly BREAST:not examined LUNGS: clear to auscultation and percussion HEART: regular rate & rhythm, no murmurs, no gallops, S1 normal and S2 normal ABDOMEN:abdomen soft, non-tender, obese, normal bowel sounds, no masses or organomegaly and no hepatosplenomegaly BACK: Back symmetric, no curvature., No CVA tenderness EXTREMITIES:less then 2 second capillary refill, no joint deformities, effusion, or inflammation, no edema, no skin discoloration, no clubbing, no cyanosis  NEURO: alert & oriented x 3 with fluent speech, no focal motor/sensory deficits, gait normal    LABORATORY DATA: CBC    Component Value Date/Time    WBC 4.7 05/07/2013 1019   WBC 14.4* 03/03/2012 1033   RBC 4.45 05/07/2013 1019   RBC 3.61* 03/03/2012 1033   HGB 12.9 05/07/2013 1019   HGB 11.8 03/03/2012 1033   HCT 39.4 05/07/2013 1019   HCT 35.1 03/03/2012 1033   PLT 242 05/07/2013 1019   PLT 161 03/03/2012 1033   MCV 88.5 05/07/2013 1019   MCV 97.2 03/03/2012 1033   MCH 29.0 05/07/2013 1019   MCH 32.7 03/03/2012 1033   MCHC 32.7 05/07/2013 1019   MCHC 33.6 03/03/2012 1033   RDW 12.9 05/07/2013 1019   RDW 15.1* 03/03/2012 1033   LYMPHSABS 1.4 05/07/2013 1019   LYMPHSABS 2.4 03/03/2012 1033   MONOABS 0.4 05/07/2013 1019   MONOABS 1.1* 03/03/2012 1033   EOSABS 0.1 05/07/2013 1019   EOSABS 0.2 03/03/2012 1033   BASOSABS 0.0 05/07/2013 1019   BASOSABS 0.1 03/03/2012 1033      Chemistry      Component Value Date/Time   NA 142 05/07/2013 1019   NA 140 03/03/2012 1033   K 4.2 05/07/2013 1019   K 3.8 03/03/2012 1033   CL 104 05/07/2013 1019   CL 101 03/03/2012 1033   CO2 27 05/07/2013 1019   CO2 30* 03/03/2012 1033   BUN 14 05/07/2013 1019   BUN 15.0 03/03/2012 1033   CREATININE 0.59 05/07/2013 1019   CREATININE 0.6 03/03/2012 1033      Component Value Date/Time   CALCIUM 9.2 05/07/2013 1019   CALCIUM 9.2 03/03/2012 1033   ALKPHOS 93 05/07/2013 1019   ALKPHOS 115 03/03/2012 1033   AST 25 05/07/2013 1019   AST 20 03/03/2012 1033   ALT 50* 05/07/2013 1019   ALT 44 03/03/2012 1033   BILITOT 0.2* 05/07/2013 1019   BILITOT 0.31 03/03/2012 1033        ASSESSMENT:  1. 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 S/P surgery followed by radiation therapy as well as chemotherapy consisting of carboplatin/paclitaxel x6 cycles (11/12/11- 02/25/2012). Presently she is been observed and presently has no evidence of obvious disease by MRI scan of the brain or CT scans.  2. DVT left leg with pulmonary embolus, S/P >1 year worth of anticoagulation. Thought to be secondary to malignancy which is now in remission and she stopped Coumadin in July  2014.  3. S/P Port-A-Cath removal on 08/10/2012  4. S/P IVC filter removed on 08/10/2012  5. Chronic constipation from poor diet and medications  6. Chronic pain in her legs and feet which is probably neuropathic. She did not do well with gabapentin and doesn't want to try that again. She is far better when she is sitting in a chair. 7. Increased urination during sleeping hours 8. Constipation 9. GERD, on Nexium  Patient Active Problem List   Diagnosis Date Noted  . Occlusion and stenosis of carotid artery without  mention of cerebral infarction 07/18/2012  . DVT (deep venous thrombosis) 12/09/2011  . Pulmonary embolism 12/09/2011  . COPD with asthma 10/18/2011  . Allergy   . Fibromyalgia   . Pulmonary nodules   . Metastatic adenocarcinoma to brain, unknown primary      PLAN:  1. I personally reviewed and went over laboratory results with the patient.  The results are noted within this dictation. 2. I personally reviewed and went over radiographic studies with the patient.  The results are noted within this dictation.   3. Restaging CT scans scheduled in May 2015. 4. Labs in 3 months: CBC diff, CMET 5. Defer brain imaging to Neurosurgery and Rad Onc 6. We have compromised on surveillance imaging.  We will perform CT scans every 6 months with a PET scan every 18 months.  7. I recommended decreasing fluid intake after 8 pm to help decrease frequency of urination during sleeping hours.  8. I recommended adding Metamucil to help with BM regimen 9. I recommend Dexilant versus Prilosec.  She declines at this time and will try samples from PCP for GERD. 10. Return in 3 months following CT scans to review results.    THERAPY PLAN:  It is reasonable to perform CT scans every 6 months in this patient and utilize PET scan for any worrisome findings or changes on CT imaging.  However, the patient and her family are concerned about a lack of PET scan given the patient's unique presentation and a  negative CT scans but positive PET scan.  Therefore, we will perform CT scans every 6 months and PET scan every 18 months. We will adapt NCCN guidelines for NSCLC since she is thought to have an adenocarcinoma of lung primary but not diagnosed clearly. NCCN guidelines for Non-Small Cell Lung Cancer Surveillance are as follows:   A. H+P every 6-12 months   B. CT CAP every 6 months with PET scan every 18 months   C. PET scan not typically indicated for surveillance   All questions were answered. The patient knows to call the clinic with any problems, questions or concerns. We can certainly see the patient much sooner if necessary.  Patient and plan discussed with Dr. Farrel Gobble and he is in agreement with the aforementioned.   I spent 30 minutes counseling the patient face to face. The total time spent in the appointment was 40 minutes.  KEFALAS,THOMAS

## 2013-05-10 ENCOUNTER — Encounter (HOSPITAL_BASED_OUTPATIENT_CLINIC_OR_DEPARTMENT_OTHER): Payer: BC Managed Care – PPO | Admitting: Oncology

## 2013-05-10 ENCOUNTER — Encounter (HOSPITAL_COMMUNITY): Payer: Self-pay | Admitting: Oncology

## 2013-05-10 VITALS — BP 113/72 | HR 67 | Temp 98.5°F | Resp 16 | Wt 188.8 lb

## 2013-05-10 DIAGNOSIS — C801 Malignant (primary) neoplasm, unspecified: Secondary | ICD-10-CM

## 2013-05-10 DIAGNOSIS — R350 Frequency of micturition: Secondary | ICD-10-CM

## 2013-05-10 DIAGNOSIS — K5900 Constipation, unspecified: Secondary | ICD-10-CM

## 2013-05-10 DIAGNOSIS — C7949 Secondary malignant neoplasm of other parts of nervous system: Secondary | ICD-10-CM

## 2013-05-10 DIAGNOSIS — C7931 Secondary malignant neoplasm of brain: Secondary | ICD-10-CM

## 2013-05-10 DIAGNOSIS — K219 Gastro-esophageal reflux disease without esophagitis: Secondary | ICD-10-CM

## 2013-05-10 NOTE — Patient Instructions (Signed)
[facility] Discharge Instructions  RECOMMENDATIONS MADE BY THE CONSULTANT AND ANY TEST RESULTS WILL BE SENT TO YOUR REFERRING PHYSICIAN.  EXAM FINDINGS BY THE PHYSICIAN TODAY AND SIGNS OR SYMPTOMS TO REPORT TO CLINIC OR PRIMARY PHYSICIAN: Exam and findings as discussed by Robynn Pane, PA-C.  Will do CT scans as scheduled in May and PET Scan in November.  MEDICATIONS PRESCRIBED:  none  INSTRUCTIONS/FOLLOW-UP: Follow-up after scans in May.  Thank you for choosing [facility] to provide your oncology and hematology care.  To afford each patient quality time with our providers, please arrive at least 15 minutes before your scheduled appointment time.  With your help, our goal is to use those 15 minutes to complete the necessary work-up to ensure our physicians have the information they need to help with your evaluation and healthcare recommendations.    Effective January 1st, 2014, we ask that you re-schedule your appointment with our physicians should you arrive 10 or more minutes late for your appointment.  We strive to give you quality time with our providers, and arriving late affects you and other patients whose appointments are after yours.    Again, thank you for choosing [facility].  Our hope is that these requests will decrease the amount of time that you wait before being seen by our physicians.       _____________________________________________________________  Should you have questions after your visit to [facility], please contact our office at [phone] between the hours of 8:30 a.m. and 5:00 p.m.  Voicemails left after 4:30 p.m. will not be returned until the following business day.  For prescription refill requests, have your pharmacy contact our office with your prescription refill request.

## 2013-05-18 ENCOUNTER — Ambulatory Visit
Admission: RE | Admit: 2013-05-18 | Discharge: 2013-05-18 | Disposition: A | Discharge status: Home / Self Care | Payer: Medicare Other | Source: Ambulatory Visit | Attending: Radiation Oncology | Admitting: Radiation Oncology

## 2013-05-18 DIAGNOSIS — C7949 Secondary malignant neoplasm of other parts of nervous system: Principal | ICD-10-CM

## 2013-05-18 DIAGNOSIS — C7931 Secondary malignant neoplasm of brain: Secondary | ICD-10-CM

## 2013-05-18 MED ORDER — GADOBENATE DIMEGLUMINE 529 MG/ML IV SOLN
17.0000 mL | Freq: Once | INTRAVENOUS | Status: AC | PRN
  Administered 2013-05-18: 17 mL via INTRAVENOUS

## 2013-07-03 ENCOUNTER — Other Ambulatory Visit: Payer: Self-pay | Admitting: Radiation Therapy

## 2013-07-03 DIAGNOSIS — C7949 Secondary malignant neoplasm of other parts of nervous system: Principal | ICD-10-CM

## 2013-07-03 DIAGNOSIS — C7931 Secondary malignant neoplasm of brain: Secondary | ICD-10-CM

## 2013-07-17 ENCOUNTER — Other Ambulatory Visit (HOSPITAL_COMMUNITY): Payer: BC Managed Care – PPO

## 2013-07-17 ENCOUNTER — Ambulatory Visit: Payer: BC Managed Care – PPO | Admitting: Vascular Surgery

## 2013-08-03 ENCOUNTER — Encounter (HOSPITAL_COMMUNITY): Payer: BC Managed Care – PPO | Attending: Oncology

## 2013-08-03 DIAGNOSIS — C801 Malignant (primary) neoplasm, unspecified: Secondary | ICD-10-CM

## 2013-08-03 DIAGNOSIS — C7949 Secondary malignant neoplasm of other parts of nervous system: Principal | ICD-10-CM

## 2013-08-03 DIAGNOSIS — C7931 Secondary malignant neoplasm of brain: Secondary | ICD-10-CM | POA: Insufficient documentation

## 2013-08-03 LAB — CBC WITH DIFFERENTIAL/PLATELET
Basophils Absolute: 0 10*3/uL (ref 0.0–0.1)
Basophils Relative: 1 % (ref 0–1)
EOS ABS: 0.1 10*3/uL (ref 0.0–0.7)
Eosinophils Relative: 2 % (ref 0–5)
HEMATOCRIT: 42.9 % (ref 36.0–46.0)
HEMOGLOBIN: 13.9 g/dL (ref 12.0–15.0)
Lymphocytes Relative: 24 % (ref 12–46)
Lymphs Abs: 1.6 10*3/uL (ref 0.7–4.0)
MCH: 27.8 pg (ref 26.0–34.0)
MCHC: 32.4 g/dL (ref 30.0–36.0)
MCV: 85.8 fL (ref 78.0–100.0)
MONO ABS: 0.7 10*3/uL (ref 0.1–1.0)
MONOS PCT: 11 % (ref 3–12)
NEUTROS ABS: 4.2 10*3/uL (ref 1.7–7.7)
Neutrophils Relative %: 62 % (ref 43–77)
Platelets: 279 10*3/uL (ref 150–400)
RBC: 5 MIL/uL (ref 3.87–5.11)
RDW: 13.4 % (ref 11.5–15.5)
WBC: 6.7 10*3/uL (ref 4.0–10.5)

## 2013-08-03 LAB — COMPREHENSIVE METABOLIC PANEL
ALK PHOS: 112 U/L (ref 39–117)
ALT: 20 U/L (ref 0–35)
AST: 16 U/L (ref 0–37)
Albumin: 3.7 g/dL (ref 3.5–5.2)
BILIRUBIN TOTAL: 0.3 mg/dL (ref 0.3–1.2)
BUN: 15 mg/dL (ref 6–23)
CHLORIDE: 103 meq/L (ref 96–112)
CO2: 26 mEq/L (ref 19–32)
Calcium: 9.6 mg/dL (ref 8.4–10.5)
Creatinine, Ser: 0.64 mg/dL (ref 0.50–1.10)
GFR calc Af Amer: >90 mL/min (ref >90)
GFR calc non Af Amer: >90 mL/min (ref >90)
GLUCOSE: 123 mg/dL — AB (ref 70–99)
POTASSIUM: 4.2 meq/L (ref 3.7–5.3)
Sodium: 140 mEq/L (ref 137–147)
Total Protein: 7.3 g/dL (ref 6.0–8.3)

## 2013-08-03 NOTE — Progress Notes (Signed)
Labs drawn today for cbc/diff,cmp 

## 2013-08-06 ENCOUNTER — Ambulatory Visit (HOSPITAL_COMMUNITY)
Admission: RE | Admit: 2013-08-06 | Discharge: 2013-08-06 | Disposition: A | Discharge status: Home / Self Care | Payer: BC Managed Care – PPO | Source: Ambulatory Visit | Attending: Oncology | Admitting: Oncology

## 2013-08-06 ENCOUNTER — Ambulatory Visit (HOSPITAL_COMMUNITY): Payer: BC Managed Care – PPO

## 2013-08-06 DIAGNOSIS — C7949 Secondary malignant neoplasm of other parts of nervous system: Principal | ICD-10-CM

## 2013-08-06 DIAGNOSIS — K7689 Other specified diseases of liver: Secondary | ICD-10-CM | POA: Insufficient documentation

## 2013-08-06 DIAGNOSIS — R918 Other nonspecific abnormal finding of lung field: Secondary | ICD-10-CM | POA: Insufficient documentation

## 2013-08-06 DIAGNOSIS — C7931 Secondary malignant neoplasm of brain: Secondary | ICD-10-CM | POA: Insufficient documentation

## 2013-08-06 MED ORDER — SODIUM CHLORIDE 0.9 % IJ SOLN
INTRAMUSCULAR | Status: AC
  Filled 2013-08-06: qty 500

## 2013-08-06 MED ORDER — IOHEXOL 300 MG/ML  SOLN
100.0000 mL | Freq: Once | INTRAMUSCULAR | Status: AC | PRN
  Administered 2013-08-06: 100 mL via INTRAVENOUS

## 2013-08-06 NOTE — Progress Notes (Signed)
[name], MD [address] [place] [address]  Metastatic adenocarcinoma to brain, unknown primary - Plan: CBC with Differential, Comprehensive metabolic panel, CBC with Differential, Comprehensive metabolic panel, CBC with Differential, Comprehensive metabolic panel, NM PET Image Restag (PS) Skull Base To Thigh  CURRENT THERAPY:Surveillance per NCCN guidelines  INTERVAL HISTORY: Haley Lowe 58 y.o. 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 S/P surgery followed by radiation therapy as well as chemotherapy consisting of carboplatin/paclitaxel x6 cycles (11/12/11- 02/25/2012). She was offered maintenance Alimta which she has declined. Presently she is been observed and presently has no evidence of obvious disease by MRI scan of the brain or CT scans.     Metastatic adenocarcinoma to brain, unknown primary   09/15/2011 Initial Diagnosis Metastatic adenocarcinoma to brain, unknown primary.  Resection from frontal brain lesion   09/30/2011 Imaging PET scan shows lung lesion and liver lesion   10/13/2011 Imaging Negative EGD with negative biopsies.   11/12/2011 - 02/25/2012 Chemotherapy Carboplatin/Paclitaxel x 6 cycles.  Patient declined maintenance Alimta x 6 cycles   01/06/2012 Remission PET scan  demonstrates remission   07/26/2012 Remission PET scan demonstrates continued remission   08/06/2013 Remission CT CAP- negative for recurrence     I personally reviewed and went over laboratory results with the patient.  The results are noted within this dictation.  I personally reviewed and went over radiographic studies with the patient.  The results are noted within this dictation.  CT CAP on 5/4 demonstrates the following: Stable tiny indeterminate pulmonary nodules. No new or progressive  disease.  Stable hepatic steatosis. No evidence of abdominal or pelvic  metastatic disease.  I  provided the patient education regarding the differences of PET and CT scans.  She was educated regarding the threshold of identifying abnormalities in each different testing.  She was taught that the threshold for PET scans in about 10 mm in size compared to 3-4 mm on a CT scan.  She is agreeable to pursue a PET scan at her request.  She asks if this can be performed in Jan 2016 due to financial constraints.  This is acceptable.    She reports about a 10 lb weight loss, but she is intentionally working on weight loss.   Oncologically, she denies any complaints and ROS questioning is negative.   Past Medical History  Diagnosis Date  . Hypertension   . PONV (postoperative nausea and vomiting)   . Anxiety   . Depression   . COPD (chronic obstructive pulmonary disease)   . GERD (gastroesophageal reflux disease)   . Headache(784.0)   . Arthritis   . Adenocarcinoma 09/28/2011  . Hx of adenomatous colonic polyps   . Pulmonary nodules   . Diabetes mellitus   . Emphysema   . 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 Allergy     hips and shoulders  . Cataract     1 lens implaqnt right eye,intact on left eye  . Goiter     nodule watching  . Fibromyalgia     neuropathy tops of both feet  . Metastasis to brain 09/15/11     left frontoparietal region  . History of radiation therapy 10/06/11    1 fraction  15 gray  . Pulmonary emboli 11/04/11    right upper lobe and r lower lobe PE  . Lung nodule seen  on imaging study 11/04/11 CT    55mm LLL  . DVT (deep venous thrombosis) 12/09/2011  . Pulmonary embolism 12/09/2011  . Hyperlipidemia   . History of radiation therapy 10/06/11    SRS 15Gy 73fx, brain  . Metastatic adenocarcinoma to brain, unknown primary     has Pulmonary nodules; Metastatic adenocarcinoma to brain, unknown primary; Allergy; Fibromyalgia; COPD with asthma; DVT (deep venous thrombosis); Pulmonary embolism; and Occlusion and stenosis of carotid artery  without mention of cerebral infarction on her problem list.     is allergic to penicillins and codeine.  Ms. Gainer does not currently have medications on file.  Past Surgical History  Procedure Laterality Date  . Abdominal hysterectomy  1996  . Cholecystectomy  2000  . Shoulder surgery  1998    right  . Eye surgery  2001    vision correction  . Foot surgery  2005    left  . Craniotomy  09/15/2011    Procedure: CRANIOTOMY TUMOR EXCISION;  Surgeon: Hosie Spangle, MD;  Location: [facility];  Service: Neurosurgery;  Laterality: N/A;  Craniotomy resection of tumor with stealth  . Cataract extraction  2011    with lens implant    Denies any headaches, dizziness, double vision, fevers, chills, night sweats, nausea, vomiting, diarrhea, constipation, chest pain, heart palpitations, shortness of breath, blood in stool, black tarry stool, urinary pain, urinary burning, urinary frequency, hematuria.   PHYSICAL EXAMINATION  ECOG PERFORMANCE STATUS: 0 - Asymptomatic  Filed Vitals:   08/08/13 1000  BP: 102/67  Pulse: 65  Temp: 97.9 F (36.6 C)  Resp: 20    GENERAL:alert, no distress, well nourished, well developed, comfortable, cooperative, obese and smiling SKIN: skin color, texture, turgor are normal, no rashes or significant lesions HEAD: Normocephalic, No masses, lesions, tenderness or abnormalities EYES: normal, PERRLA, EOMI, Conjunctiva are pink and non-injected EARS: External ears normal OROPHARYNX:lips, buccal mucosa, and tongue normal and mucous membranes are moist  NECK: supple, no adenopathy, thyroid normal size, non-tender, without nodularity, no stridor, non-tender, trachea midline LYMPH:  no palpable lymphadenopathy BREAST:not examined LUNGS: clear to auscultation and percussion HEART: regular rate & rhythm, no murmurs, no gallops, S1 normal and S2 normal ABDOMEN:abdomen soft, non-tender, obese, normal bowel sounds, no masses or organomegaly and no  hepatosplenomegaly BACK: Back symmetric, no curvature. EXTREMITIES:less then 2 second capillary refill, no joint deformities, effusion, or inflammation, no edema, no skin discoloration, no clubbing, no cyanosis  NEURO: alert & oriented x 3 with fluent speech, no focal motor/sensory deficits, gait normal   LABORATORY DATA: CBC    Component Value Date/Time   WBC 6.7 08/03/2013 0950   WBC 14.4* 03/03/2012 1033   RBC 5.00 08/03/2013 0950   RBC 3.61* 03/03/2012 1033   HGB 13.9 08/03/2013 0950   HGB 11.8 03/03/2012 1033   HCT 42.9 08/03/2013 0950   HCT 35.1 03/03/2012 1033   PLT 279 08/03/2013 0950   PLT 161 03/03/2012 1033   MCV 85.8 08/03/2013 0950   MCV 97.2 03/03/2012 1033   MCH 27.8 08/03/2013 0950   MCH 32.7 03/03/2012 1033   MCHC 32.4 08/03/2013 0950   MCHC 33.6 03/03/2012 1033   RDW 13.4 08/03/2013 0950   RDW 15.1* 03/03/2012 1033   LYMPHSABS 1.6 08/03/2013 0950   LYMPHSABS 2.4 03/03/2012 1033   MONOABS 0.7 08/03/2013 0950   MONOABS 1.1* 03/03/2012 1033   EOSABS 0.1 08/03/2013 0950   EOSABS 0.2 03/03/2012 1033   BASOSABS 0.0 08/03/2013 0950   BASOSABS  0.1 03/03/2012 1033      Chemistry      Component Value Date/Time   NA 140 08/03/2013 0950   NA 140 03/03/2012 1033   K 4.2 08/03/2013 0950   K 3.8 03/03/2012 1033   CL 103 08/03/2013 0950   CL 101 03/03/2012 1033   CO2 26 08/03/2013 0950   CO2 30* 03/03/2012 1033   BUN 15 08/03/2013 0950   BUN 15.0 03/03/2012 1033   CREATININE 0.64 08/03/2013 0950   CREATININE 0.6 03/03/2012 1033      Component Value Date/Time   CALCIUM 9.6 08/03/2013 0950   CALCIUM 9.2 03/03/2012 1033   ALKPHOS 112 08/03/2013 0950   ALKPHOS 115 03/03/2012 1033   AST 16 08/03/2013 0950   AST 20 03/03/2012 1033   ALT 20 08/03/2013 0950   ALT 44 03/03/2012 1033   BILITOT 0.3 08/03/2013 0950   BILITOT 0.31 03/03/2012 1033       RADIOGRAPHIC STUDIES:  No results found.    ASSESSMENT:  1. 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 S/P surgery followed  by radiation therapy as well as chemotherapy consisting of carboplatin/paclitaxel x6 cycles (11/12/11- 02/25/2012). Presently she is been observed and presently has no evidence of obvious disease by MRI scan of the brain or CT scans.  2. DVT left leg with pulmonary embolus, S/P >1 year worth of anticoagulation. Thought to be secondary to malignancy which is now in remission and she stopped Coumadin in July 2014.  3. S/P Port-A-Cath removal on 08/10/2012  4. S/P IVC filter removed on 08/10/2012  5. Chronic constipation from poor diet and medications  6. Chronic pain in her legs and feet which is probably neuropathic. She did not do well with gabapentin and doesn't want to try that again. She is far better when she is sitting in a chair.   Patient Active Problem List   Diagnosis Date Noted  . Occlusion and stenosis of carotid artery without mention of cerebral infarction 07/18/2012  . DVT (deep venous thrombosis) 12/09/2011  . Pulmonary embolism 12/09/2011  . COPD with asthma 10/18/2011  . Allergy   . Fibromyalgia   . Pulmonary nodules   . Metastatic adenocarcinoma to brain, unknown primary      PLAN:  1. I personally reviewed and went over laboratory results with the patient.  The results are noted within this dictation. 2. I personally reviewed and went over radiographic studies with the patient.  The results are noted within this dictation.   3. Defer brain imaging to Neurosurgery and Rad Onc 4. Labs in 3, 6, 8 months: CBC diff, CMET 5. PET scan the first week in Jan 2016 at the patient's request due to insurance deductibles for restaging of metastatic NSCLC. 6. Patient education regarding PET versus CT imaging. If PET is denied, will pursue CT imaging in Jan 2016. 7. Return in 8 months for follow-up.    THERAPY PLAN:  It is reasonable to perform CT scans every 6 months in this patient and utilize PET scan for any worrisome findings or changes on CT imaging. However, the patient and her  family are concerned about a lack of PET scan given the patient's unique presentation and a negative CT scans but positive PET scan. Therefore, we will perform CT scans every 6 months and PET scan every 18 months. We will adapt NCCN guidelines for NSCLC since she is thought to have an adenocarcinoma of lung primary but not diagnosed clearly. NCCN guidelines for  Non-Small Cell Lung Cancer Surveillance are as follows:   A. H+P every 6-12 months   B. CT CAP every 6 months (and we will perform a PET scan in lieu of CT imaging every 18 months)  C. PET scan not typically indicated for surveillance   All questions were answered. The patient knows to call the clinic with any problems, questions or concerns. We can certainly see the patient much sooner if necessary.  Patient and plan discussed with Dr. Farrel Gobble and he is in agreement with the aforementioned.   [facility] 08/08/2013

## 2013-08-07 ENCOUNTER — Ambulatory Visit: Payer: BC Managed Care – PPO | Admitting: Vascular Surgery

## 2013-08-07 ENCOUNTER — Other Ambulatory Visit (HOSPITAL_COMMUNITY): Payer: BC Managed Care – PPO

## 2013-08-08 ENCOUNTER — Encounter (HOSPITAL_BASED_OUTPATIENT_CLINIC_OR_DEPARTMENT_OTHER): Payer: BC Managed Care – PPO | Admitting: Oncology

## 2013-08-08 ENCOUNTER — Encounter (HOSPITAL_COMMUNITY): Payer: Self-pay | Admitting: Oncology

## 2013-08-08 VITALS — BP 102/67 | HR 65 | Temp 97.9°F | Resp 20 | Wt 175.2 lb

## 2013-08-08 DIAGNOSIS — M79609 Pain in unspecified limb: Secondary | ICD-10-CM

## 2013-08-08 DIAGNOSIS — Z86711 Personal history of pulmonary embolism: Secondary | ICD-10-CM

## 2013-08-08 DIAGNOSIS — Z86718 Personal history of other venous thrombosis and embolism: Secondary | ICD-10-CM

## 2013-08-08 DIAGNOSIS — C7949 Secondary malignant neoplasm of other parts of nervous system: Secondary | ICD-10-CM

## 2013-08-08 DIAGNOSIS — C801 Malignant (primary) neoplasm, unspecified: Secondary | ICD-10-CM

## 2013-08-08 DIAGNOSIS — C7931 Secondary malignant neoplasm of brain: Secondary | ICD-10-CM

## 2013-08-08 DIAGNOSIS — G8929 Other chronic pain: Secondary | ICD-10-CM

## 2013-08-08 NOTE — Patient Instructions (Signed)
[facility] Discharge Instructions  RECOMMENDATIONS MADE BY THE CONSULTANT AND ANY TEST RESULTS WILL BE SENT TO YOUR REFERRING PHYSICIAN.  You will have lab work in 3 months, 6 months and in January 2016.  You will see the doctor in January 2016 after your PET scan.    Thank you for choosing [facility] to provide your oncology and hematology care.  To afford each patient quality time with our providers, please arrive at least 15 minutes before your scheduled appointment time.  With your help, our goal is to use those 15 minutes to complete the necessary work-up to ensure our physicians have the information they need to help with your evaluation and healthcare recommendations.    Effective January 1st, 2014, we ask that you re-schedule your appointment with our physicians should you arrive 10 or more minutes late for your appointment.  We strive to give you quality time with our providers, and arriving late affects you and other patients whose appointments are after yours.    Again, thank you for choosing [facility].  Our hope is that these requests will decrease the amount of time that you wait before being seen by our physicians.       _____________________________________________________________  Should you have questions after your visit to [facility], please contact our office at [phone] between the hours of 8:30 a.m. and 5:00 p.m.  Voicemails left after 4:30 p.m. will not be returned until the following business day.  For prescription refill requests, have your pharmacy contact our office with your prescription refill request.

## 2013-08-14 ENCOUNTER — Other Ambulatory Visit: Payer: Self-pay | Admitting: Obstetrics and Gynecology

## 2013-08-15 ENCOUNTER — Other Ambulatory Visit (HOSPITAL_COMMUNITY): Payer: Self-pay | Admitting: Oncology

## 2013-08-17 ENCOUNTER — Ambulatory Visit
Admission: RE | Admit: 2013-08-17 | Discharge: 2013-08-17 | Disposition: A | Discharge status: Home / Self Care | Payer: Medicare Other | Source: Ambulatory Visit | Attending: Radiation Oncology | Admitting: Radiation Oncology

## 2013-08-17 DIAGNOSIS — C7931 Secondary malignant neoplasm of brain: Secondary | ICD-10-CM

## 2013-08-17 DIAGNOSIS — C7949 Secondary malignant neoplasm of other parts of nervous system: Principal | ICD-10-CM

## 2013-08-17 MED ORDER — GADOBENATE DIMEGLUMINE 529 MG/ML IV SOLN
16.0000 mL | Freq: Once | INTRAVENOUS | Status: AC | PRN
  Administered 2013-08-17: 16 mL via INTRAVENOUS

## 2013-08-20 ENCOUNTER — Encounter: Payer: Self-pay | Admitting: Radiation Oncology

## 2013-08-20 ENCOUNTER — Ambulatory Visit
Admission: RE | Admit: 2013-08-20 | Discharge: 2013-08-20 | Disposition: A | Discharge status: Home / Self Care | Payer: BC Managed Care – PPO | Source: Ambulatory Visit | Attending: Radiation Oncology | Admitting: Radiation Oncology

## 2013-08-20 VITALS — BP 106/71 | HR 64 | Temp 98.3°F | Resp 20 | Wt 174.5 lb

## 2013-08-20 DIAGNOSIS — C7931 Secondary malignant neoplasm of brain: Secondary | ICD-10-CM

## 2013-08-20 NOTE — Progress Notes (Signed)
Radiation Oncology         [phone] ________________________________  Name: Haley Lowe MRN: [id number]  Date: 08/20/2013  DOB: 05/27/1955  Follow-Up Visit Note  CC: [name], MD  [employer], MD  Diagnosis:   Metastatic adenocarcinoma of unknown primary, likely lung  Interval Since Last Radiation:  Patient completed radiosurgery in July of 2013 postoperatively to the left frontoparietal region   Narrative:  The patient returns today for routine follow-up.  The patient indicates she has been doing fairly well. From a cancer standpoint, her restaging CT scans systemically looked very good. She is continuing with ongoing observation/followup. The patient has had an ear infection and is on antibiotics for this. She denies any headaches, nausea. No vision changes. No pain distantly. The patient had a recent MRI scan of the brain and presents today for discussion of this.                                        ALLERGIES:  is allergic to penicillins and codeine.  Meds: Current Outpatient Prescriptions  Medication Sig Dispense Refill  . ALPRAZolam (XANAX) 0.5 MG tablet Take 0.5 mg by mouth 3 (three) times daily as needed for anxiety.       . Cholecalciferol (VITAMIN D-3 PO) Take 2,000 Units by mouth daily.      . clindamycin (CLEOCIN) 300 MG capsule Take 300 mg by mouth 4 (four) times daily.      . fish oil-omega-3 fatty acids 1000 MG capsule Take 1 g by mouth 3 (three) times daily.      Marland Kitchen HYDROcodone-acetaminophen (NORCO) 7.5-325 MG per tablet Take 1 tablet by mouth every 6 (six) hours as needed for pain.      Marland Kitchen ibuprofen (ADVIL,MOTRIN) 800 MG tablet Take 800 mg by mouth every 8 (eight) hours as needed for pain.       Marland Kitchen LORazepam (ATIVAN) 1 MG tablet Take 1 mg by mouth every 8 (eight) hours as needed for anxiety.       . metoprolol tartrate (LOPRESSOR) 25 MG tablet Take 25 mg by mouth 2 (two) times daily.      Marland Kitchen olmesartan (BENICAR) 20 MG tablet Take 20 mg by mouth daily.  Takes 1/4 tablet  daily      . polyethylene glycol (MIRALAX / GLYCOLAX) packet Take 17 g by mouth daily. For constipation      . zolpidem (AMBIEN) 10 MG tablet Take 10 mg by mouth at bedtime as needed for sleep.      . divalproex (DEPAKOTE) 500 MG DR tablet Take 500 mg by mouth daily.       Marland Kitchen esomeprazole (NEXIUM) 40 MG capsule Take 40 mg by mouth daily before breakfast.      . Magnesium 250 MG TABS Take 1 tablet by mouth 4 (four) times daily.      . simvastatin (ZOCOR) 10 MG tablet Take 10 mg by mouth at bedtime.      Marland Kitchen warfarin (COUMADIN) 5 MG tablet Take 5 mg by mouth daily. 7mg  daily       No current facility-administered medications for this encounter.    Physical Findings: The patient is in no acute distress. Patient is alert and oriented.  weight is 174 lb 8 oz (79.153 kg). Her oral temperature is 98.3 F (36.8 C). Her blood pressure is 106/71 and her pulse is 64. Her respiration is  20. .     Lab Findings: Lab Results  Component Value Date   WBC 6.7 08/03/2013   HGB 13.9 08/03/2013   HCT 42.9 08/03/2013   MCV 85.8 08/03/2013   PLT 279 08/03/2013     Radiographic Findings: Ct Chest W Contrast  08/06/2013   CLINICAL DATA:  Followup metastatic adenocarcinoma of unknown primary.  EXAM: CT CHEST, ABDOMEN, AND PELVIS WITH CONTRAST  TECHNIQUE: Multidetector CT imaging of the chest, abdomen and pelvis was performed following the standard protocol during bolus administration of intravenous contrast.  CONTRAST:  120mL OMNIPAQUE IOHEXOL 300 MG/ML  SOLN  COMPARISON:  02/01/2013  FINDINGS: CT CHEST FINDINGS  Mild-to-moderate centrilobular emphysema again noted. Tiny 4 mm right upper and left lower lobe pulmonary nodules on images 28 and 42 remain stable. No new or enlarging pulmonary nodules or masses are identified. There is no evidence of pulmonary infiltrate or central endobronchial obstruction. No evidence of pleural or pericardial effusion.  Tiny less than 1 cm mediastinal lymph nodes remain  stable. No pathologically enlarged mediastinal or hilar lymph nodes are identified. No adenopathy seen elsewhere within the thorax. No evidence of chest wall mass or suspicious bone lesions.  CT ABDOMEN AND PELVIS FINDINGS  Hepatic steatosis again noted. Probable tiny sub-cm hepatic cyst in the caudate lobe remains stable. No new or enlarging liver lesions identified. Prior cholecystectomy noted. No evidence of biliary dilatation.  The pancreas, spleen, adrenal glands, and kidneys are normal in appearance. No evidence of hydronephrosis. No soft tissue masses or lymphadenopathy identified within the abdomen or pelvis.  Prior hysterectomy noted. Adnexal regions are unremarkable in appearance. No evidence of inflammatory process or abnormal fluid collections. No evidence of bowel wall thickening or dilatation. No suspicious bone lesions identified.  IMPRESSION: Stable tiny indeterminate pulmonary nodules. No new or progressive disease.  Stable hepatic steatosis. No evidence of abdominal or pelvic metastatic disease.   Electronically Signed   By: Earle Gell M.D.   On: 08/06/2013 12:05   Mr [name] JK Contrast  08/17/2013   CLINICAL DATA:  SRS staging, lung cancer with surgery and radiation therapy  EXAM: MRI HEAD WITHOUT AND WITH CONTRAST  TECHNIQUE: Multiplanar, multiecho pulse sequences of the brain and surrounding structures were obtained without and with intravenous contrast.  CONTRAST:  52mL MULTIHANCE GADOBENATE DIMEGLUMINE 529 MG/ML IV SOLN  COMPARISON:  None.  FINDINGS: Unchanged appearance of the surgical cavity in the posterior left frontal lobe without worrisome enhancement or surrounding edema. No new areas of enhancement are observed.  No restricted diffusion, intracranial mass lesion, acute hemorrhage, or extra-axial fluid. Mild dural enhancement is stable, within normal limits for postsurgical change.  Negative osseous structures.  IMPRESSION: Stable appearance status post surgical resection of of  left posterior frontal metastasis with subsequent XRT. No recurrent tumor.   Electronically Signed   By: Rolla Flatten M.D.   On: 08/17/2013 13:44   Ct Abdomen Pelvis W Contrast  08/06/2013   CLINICAL DATA:  Followup metastatic adenocarcinoma of unknown primary.  EXAM: CT CHEST, ABDOMEN, AND PELVIS WITH CONTRAST  TECHNIQUE: Multidetector CT imaging of the chest, abdomen and pelvis was performed following the standard protocol during bolus administration of intravenous contrast.  CONTRAST:  152mL OMNIPAQUE IOHEXOL 300 MG/ML  SOLN  COMPARISON:  02/01/2013  FINDINGS: CT CHEST FINDINGS  Mild-to-moderate centrilobular emphysema again noted. Tiny 4 mm right upper and left lower lobe pulmonary nodules on images 28 and 42 remain stable. No new or enlarging pulmonary nodules or masses  are identified. There is no evidence of pulmonary infiltrate or central endobronchial obstruction. No evidence of pleural or pericardial effusion.  Tiny less than 1 cm mediastinal lymph nodes remain stable. No pathologically enlarged mediastinal or hilar lymph nodes are identified. No adenopathy seen elsewhere within the thorax. No evidence of chest wall mass or suspicious bone lesions.  CT ABDOMEN AND PELVIS FINDINGS  Hepatic steatosis again noted. Probable tiny sub-cm hepatic cyst in the caudate lobe remains stable. No new or enlarging liver lesions identified. Prior cholecystectomy noted. No evidence of biliary dilatation.  The pancreas, spleen, adrenal glands, and kidneys are normal in appearance. No evidence of hydronephrosis. No soft tissue masses or lymphadenopathy identified within the abdomen or pelvis.  Prior hysterectomy noted. Adnexal regions are unremarkable in appearance. No evidence of inflammatory process or abnormal fluid collections. No evidence of bowel wall thickening or dilatation. No suspicious bone lesions identified.  IMPRESSION: Stable tiny indeterminate pulmonary nodules. No new or progressive disease.  Stable hepatic  steatosis. No evidence of abdominal or pelvic metastatic disease.   Electronically Signed   By: Earle Gell M.D.   On: 08/06/2013 12:05    Impression:    The patient's MRI scan was reviewed in multidisciplinary brain clinic.  No sign of recurrence or new disease.  She will continue followup through the brain program. We discussed potential late lengthening the time interval of her scans a little and she wishes to go to a 4 month schedule at this time. She will see neurosurgery next and I look forward to reviewing her next scan in brain conference.  Plan:  MRI scan of the brain in 4 months.  I spent 15 minutes with the patient today, the majority of which was spent counseling the patient on the diagnosis of cancer and coordinating care.   Jodelle Gross, M.D., Ph.D.

## 2013-08-20 NOTE — Progress Notes (Signed)
Follow up [facility] 10/06/2011 MRI 08/17/13, here for results, has swollen right side face, started clindamycin, 4x day for 7 days for  ear infection,  Was on levaquin 1 month ago for bronchitis and ear infection, no head aches, nausea, dizzy ness,  Appetite good, pain in  Right ear throbs at night, wakes up a 6/10 scale, taking ibuprofen prn  9:29 AM

## 2013-08-24 ENCOUNTER — Other Ambulatory Visit: Payer: BC Managed Care – PPO

## 2013-09-17 ENCOUNTER — Other Ambulatory Visit (HOSPITAL_COMMUNITY): Payer: Self-pay | Admitting: Family Medicine

## 2013-09-17 DIAGNOSIS — R221 Localized swelling, mass and lump, neck: Principal | ICD-10-CM

## 2013-09-17 DIAGNOSIS — R220 Localized swelling, mass and lump, head: Secondary | ICD-10-CM

## 2013-09-18 ENCOUNTER — Ambulatory Visit (HOSPITAL_COMMUNITY)
Admission: RE | Admit: 2013-09-18 | Discharge: 2013-09-18 | Disposition: A | Discharge status: Home / Self Care | Payer: Medicare Other | Source: Ambulatory Visit | Attending: Family Medicine | Admitting: Family Medicine

## 2013-09-18 DIAGNOSIS — E049 Nontoxic goiter, unspecified: Secondary | ICD-10-CM | POA: Insufficient documentation

## 2013-09-18 DIAGNOSIS — R220 Localized swelling, mass and lump, head: Secondary | ICD-10-CM

## 2013-09-18 DIAGNOSIS — R221 Localized swelling, mass and lump, neck: Secondary | ICD-10-CM

## 2013-09-18 DIAGNOSIS — Z85118 Personal history of other malignant neoplasm of bronchus and lung: Secondary | ICD-10-CM | POA: Insufficient documentation

## 2013-09-21 ENCOUNTER — Other Ambulatory Visit (HOSPITAL_COMMUNITY): Payer: Self-pay | Admitting: Family Medicine

## 2013-09-21 DIAGNOSIS — R9389 Abnormal findings on diagnostic imaging of other specified body structures: Secondary | ICD-10-CM

## 2013-09-24 ENCOUNTER — Ambulatory Visit (HOSPITAL_COMMUNITY)
Admission: RE | Admit: 2013-09-24 | Discharge: 2013-09-24 | Disposition: A | Discharge status: Home / Self Care | Payer: Medicare Other | Source: Ambulatory Visit | Attending: Family Medicine | Admitting: Family Medicine

## 2013-09-24 ENCOUNTER — Encounter (HOSPITAL_COMMUNITY): Payer: Self-pay

## 2013-09-24 DIAGNOSIS — H9209 Otalgia, unspecified ear: Secondary | ICD-10-CM | POA: Insufficient documentation

## 2013-09-24 DIAGNOSIS — Z85118 Personal history of other malignant neoplasm of bronchus and lung: Secondary | ICD-10-CM | POA: Diagnosis not present

## 2013-09-24 DIAGNOSIS — R599 Enlarged lymph nodes, unspecified: Secondary | ICD-10-CM | POA: Diagnosis not present

## 2013-09-24 DIAGNOSIS — R221 Localized swelling, mass and lump, neck: Secondary | ICD-10-CM

## 2013-09-24 DIAGNOSIS — R220 Localized swelling, mass and lump, head: Secondary | ICD-10-CM | POA: Insufficient documentation

## 2013-09-24 DIAGNOSIS — R9389 Abnormal findings on diagnostic imaging of other specified body structures: Secondary | ICD-10-CM

## 2013-09-24 MED ORDER — SODIUM CHLORIDE 0.9 % IJ SOLN
INTRAMUSCULAR | Status: AC
  Filled 2013-09-24: qty 200

## 2013-09-24 MED ORDER — IOHEXOL 300 MG/ML  SOLN
75.0000 mL | Freq: Once | INTRAMUSCULAR | Status: AC | PRN
  Administered 2013-09-24: 75 mL via INTRAVENOUS

## 2013-09-26 ENCOUNTER — Other Ambulatory Visit: Payer: Self-pay | Admitting: Endocrinology

## 2013-09-26 DIAGNOSIS — E041 Nontoxic single thyroid nodule: Secondary | ICD-10-CM

## 2013-09-27 ENCOUNTER — Other Ambulatory Visit: Payer: Medicare Other

## 2013-09-27 ENCOUNTER — Ambulatory Visit
Admission: RE | Admit: 2013-09-27 | Discharge: 2013-09-27 | Disposition: A | Discharge status: Home / Self Care | Payer: Medicare Other | Source: Ambulatory Visit | Attending: Endocrinology | Admitting: Endocrinology

## 2013-09-27 ENCOUNTER — Other Ambulatory Visit (HOSPITAL_COMMUNITY)
Admission: RE | Admit: 2013-09-27 | Discharge: 2013-09-27 | Disposition: A | Discharge status: Home / Self Care | Payer: Medicare Other | Source: Ambulatory Visit | Attending: Diagnostic Radiology | Admitting: Diagnostic Radiology

## 2013-09-27 DIAGNOSIS — E041 Nontoxic single thyroid nodule: Secondary | ICD-10-CM | POA: Insufficient documentation

## 2013-10-09 ENCOUNTER — Other Ambulatory Visit: Payer: Self-pay | Admitting: Radiation Therapy

## 2013-10-09 DIAGNOSIS — C7931 Secondary malignant neoplasm of brain: Secondary | ICD-10-CM

## 2013-10-09 DIAGNOSIS — C7949 Secondary malignant neoplasm of other parts of nervous system: Principal | ICD-10-CM

## 2013-10-29 HISTORY — PX: TOTAL THYROIDECTOMY: SHX2547

## 2013-11-08 ENCOUNTER — Other Ambulatory Visit (HOSPITAL_COMMUNITY): Payer: BC Managed Care – PPO

## 2013-11-19 ENCOUNTER — Ambulatory Visit (HOSPITAL_COMMUNITY)
Admission: RE | Admit: 2013-11-19 | Discharge: 2013-11-19 | Disposition: A | Discharge status: Home / Self Care | Payer: Medicare Other | Source: Ambulatory Visit | Attending: Family Medicine | Admitting: Family Medicine

## 2013-11-19 ENCOUNTER — Other Ambulatory Visit (HOSPITAL_COMMUNITY): Payer: Self-pay | Admitting: Family Medicine

## 2013-11-19 DIAGNOSIS — M25519 Pain in unspecified shoulder: Secondary | ICD-10-CM | POA: Insufficient documentation

## 2013-11-19 DIAGNOSIS — M5030 Other cervical disc degeneration, unspecified cervical region: Secondary | ICD-10-CM | POA: Diagnosis not present

## 2013-11-19 DIAGNOSIS — M542 Cervicalgia: Secondary | ICD-10-CM

## 2013-11-20 NOTE — Progress Notes (Signed)
Haley Lowe., MD [place] [id number]  Primary papillary thyroid carcinoma - Plan: aspirin 81 MG chewable tablet, levothyroxine (SYNTHROID, LEVOTHROID) 112 MCG tablet, oxyCODONE (OXY IR/ROXICODONE) 5 MG immediate release tablet, cetirizine (ZYRTEC) 10 MG tablet, CANCELED: MR Brain W Wo Contrast  Metastatic adenocarcinoma to brain, unknown primary - Plan: aspirin 81 MG chewable tablet, levothyroxine (SYNTHROID, LEVOTHROID) 112 MCG tablet, oxyCODONE (OXY IR/ROXICODONE) 5 MG immediate release tablet, cetirizine (ZYRTEC) 10 MG tablet, CANCELED: MR Brain W Wo Contrast  CURRENT THERAPY: Surveillance per NCCN guidelines  INTERVAL HISTORY: Haley Lowe 58 y.o. 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 S/P surgery followed by radiation therapy as well as chemotherapy consisting of carboplatin/paclitaxel x6 cycles (11/12/11- 02/25/2012). She was offered maintenance Alimta which she has declined. Presently she is been observed and presently has no evidence of obvious disease by MRI scan of the brain or CT scans.    Metastatic adenocarcinoma to brain, unknown primary   09/15/2011 Initial Diagnosis Metastatic adenocarcinoma to brain, unknown primary.  Resection from frontal brain lesion   09/30/2011 Imaging PET scan shows lung lesion and liver lesion   10/13/2011 Imaging Negative EGD with negative biopsies.   11/12/2011 - 02/25/2012 Chemotherapy Carboplatin/Paclitaxel x 6 cycles.  Patient declined maintenance Alimta x 6 cycles   01/06/2012 Remission PET scan  demonstrates remission   07/26/2012 Remission PET scan demonstrates continued remission   08/06/2013 Remission CT CAP- negative for recurrence   08/17/2013 Imaging MRI brain- Stable appearance status post surgical resection of of left posterior frontal metastasis with subsequent XRT. No recurrent tumor.     Primary papillary thyroid carcinoma   09/27/2013 Initial Diagnosis Diagnosis THYROID, FINE NEEDLE ASPIRATION RIGHT, (SPECIMEN 1 OF 1, COLLECTED ON 09/27/2013): MALIGNANT (Haley Lowe). MALIGNANT CELLS CONSISTENT WITH CARCINOMA.   10/29/2013 Surgery Right and left thyroid lobectomy with R level 4 lymph node and 2 rR paratracheal lymph nodes positive for metastatic disease   I personally reviewed and went over laboratory results with the patient.  The results are noted within this dictation.   I personally reviewed and went over radiographic studies with the patient.  The results are noted within this dictation. Labs were performed at [facility] on 11/20/2013 and are documented below.  She recently underwent US-guided biopsy of thyroid on 09/27/2013.  I personally reviewed and went over pathology results with the patient.  Pathology is positive for malignant cells.  Subsequently, she was referred to Dr. Vevelyn Royals at [facility]  He performed a right and left thyroid lobectomy on 10/29/2013 with lymph node sampling.  Lymph nodes were positive for metastatic disease.  As a result, he requested staging scans and these have been manipulated (with regards to scheduled dates) to assist in Dr. Amedeo Kinsman planning for a possible lymph node dissection.  She continues to heal from her thyroid surgery.  We will assist as needed during the patient's treatment for papillary thyroid cancer.  Past Medical History  Diagnosis Date  . Hypertension   . PONV (postoperative nausea and vomiting)   . Anxiety   . Depression   . COPD (chronic obstructive pulmonary disease)   . GERD (gastroesophageal reflux disease)   . Headache(784.0)   . Arthritis   . Adenocarcinoma 09/28/2011  . Hx of adenomatous colonic polyps   . Pulmonary nodules   . Diabetes mellitus   . Emphysema   . Metastatic adenocarcinoma to brain 09/15/11  tumor resection path  . Allergy     hips and shoulders  . Cataract     1 lens implaqnt right eye,intact on left eye  . Goiter      nodule watching  . Fibromyalgia     neuropathy tops of both feet  . Metastasis to brain 09/15/11     left frontoparietal region  . History of radiation therapy 10/06/11    1 fraction  15 gray  . Pulmonary emboli 11/04/11    right upper lobe and r lower lobe PE  . Lung nodule seen on imaging study 11/04/11 CT    21mm LLL  . DVT (deep venous thrombosis) 12/09/2011  . Pulmonary embolism 12/09/2011  . Hyperlipidemia   . History of radiation therapy 10/06/11    SRS 15Gy 34fx, brain  . Metastatic adenocarcinoma to brain, unknown primary     has Pulmonary nodules; Metastatic adenocarcinoma to brain, unknown primary; Allergy; Fibromyalgia; COPD with asthma; DVT (deep venous thrombosis); Pulmonary embolism; Occlusion and stenosis of carotid artery without mention of cerebral infarction; and Primary papillary thyroid carcinoma on her problem list.     is allergic to penicillins and codeine.  Haley Lowe had no medications administered during this visit.  Past Surgical History  Procedure Laterality Date  . Abdominal hysterectomy  1996  . Cholecystectomy  2000  . Shoulder surgery  1998    right  . Eye surgery  2001    vision correction  . Foot surgery  2005    left  . Craniotomy  09/15/2011    Procedure: CRANIOTOMY TUMOR EXCISION;  Surgeon: Hosie Spangle, MD;  Location: [facility];  Service: Neurosurgery;  Laterality: N/A;  Craniotomy resection of tumor with stealth  . Cataract extraction  2011    with lens implant    Denies any headaches, dizziness, double vision, fevers, chills, night sweats, nausea, vomiting, diarrhea, constipation, chest pain, heart palpitations, shortness of breath, blood in stool, black tarry stool, urinary pain, urinary burning, urinary frequency, hematuria.   PHYSICAL EXAMINATION  ECOG PERFORMANCE STATUS: 1 - Symptomatic but completely ambulatory  Filed Vitals:   11/21/13 1545  BP: 126/70  Pulse: 59  Temp: 98 F (36.7 C)  Resp: 20    GENERAL:alert, no  distress, well nourished, well developed, comfortable, cooperative and smiling SKIN: skin color, texture, turgor are normal, no rashes or significant lesions HEAD: Normocephalic, No masses, lesions, tenderness or abnormalities EYES: normal, PERRLA, EOMI, Conjunctiva are pink and non-injected EARS: External ears normal OROPHARYNX:mucous membranes are moist  NECK: supple, trachea midline, clean gauze and bandage on neck. LYMPH:  not examined BREAST:not examined LUNGS: not examined HEART: not examined ABDOMEN:not examined BACK: Back symmetric, no curvature. EXTREMITIES:less then 2 second capillary refill, no cyanosis  NEURO: alert & oriented x 3 with fluent speech, no focal motor/sensory deficits, gait normal   LABORATORY DATA: CBC    Component Value Date/Time   WBC 6.7 08/03/2013 0950   WBC 14.4* 03/03/2012 1033   RBC 5.00 08/03/2013 0950   RBC 3.61* 03/03/2012 1033   HGB 13.9 08/03/2013 0950   HGB 11.8 03/03/2012 1033   HCT 42.9 08/03/2013 0950   HCT 35.1 03/03/2012 1033   PLT 279 08/03/2013 0950   PLT 161 03/03/2012 1033   MCV 85.8 08/03/2013 0950   MCV 97.2 03/03/2012 1033   MCH 27.8 08/03/2013 0950   MCH 32.7 03/03/2012 1033   MCHC 32.4 08/03/2013 0950   MCHC 33.6 03/03/2012 1033   RDW 13.4 08/03/2013  0950   RDW 15.1* 03/03/2012 1033   LYMPHSABS 1.6 08/03/2013 0950   LYMPHSABS 2.4 03/03/2012 1033   MONOABS 0.7 08/03/2013 0950   MONOABS 1.1* 03/03/2012 1033   EOSABS 0.1 08/03/2013 0950   EOSABS 0.2 03/03/2012 1033   BASOSABS 0.0 08/03/2013 0950   BASOSABS 0.1 03/03/2012 1033      Chemistry      Component Value Date/Time   NA 140 08/03/2013 0950   NA 140 03/03/2012 1033   K 4.2 08/03/2013 0950   K 3.8 03/03/2012 1033   CL 103 08/03/2013 0950   CL 101 03/03/2012 1033   CO2 26 08/03/2013 0950   CO2 30* 03/03/2012 1033   BUN 15 08/03/2013 0950   BUN 15.0 03/03/2012 1033   CREATININE 0.64 08/03/2013 0950   CREATININE 0.6 03/03/2012 1033      Component Value Date/Time   CALCIUM 9.6 08/03/2013  0950   CALCIUM 9.2 03/03/2012 1033   ALKPHOS 112 08/03/2013 0950   ALKPHOS 115 03/03/2012 1033   AST 16 08/03/2013 0950   AST 20 03/03/2012 1033   ALT 20 08/03/2013 0950   ALT 44 03/03/2012 1033   BILITOT 0.3 08/03/2013 0950   BILITOT 0.31 03/03/2012 1033       11/20/2013  WBC 4.9 Hgb 12.7 Platelet 258 Glucose 150ESR 6 TSH 5.948 Ferritin 56RF <10 ANA positive ANA titer 1:40, speckled pattern PTH 55.4 Ca++ 9.1     PATHOLOGY:  727/2015  1. Right and left thyroid lobectomy- High grade papillary thyroid carcinoma with marked vascular/lymphatic invasion. 2. Right level 4 thyroid lymph node- metastatic carcinoma 3. Right paratracheal lymph nodes- Metastatic carcinoma involving two of two lymph nodes  + for cytokeratin AE1/AE3, cytokeratin 7, and thyroglobulin. - for calcitonin, CK20, chromogranin, ER, pan mel, S-100, synaptophysin, and TTF-1  "Despite the TTF-1 negativity, it is probably best classified as a high grade papillary carcinoma"    09/27/2013  Diagnosis THYROID, FINE NEEDLE ASPIRATION RIGHT, (SPECIMEN 1 OF 1, COLLECTED ON 09/27/2013): MALIGNANT (Haley Lowe). MALIGNANT CELLS CONSISTENT WITH CARCINOMA. Claudette Laws MD Pathologist, Electronic Signature (Case signed 09/28/2013)     ASSESSMENT:  1. Metastatic high grade, papillary thyroid cancer, metastatic to at least three lymph nodes.  Undergoing ENT treatment 2. 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 S/P surgery followed by radiation therapy as well as chemotherapy consisting of carboplatin/paclitaxel x6 cycles (11/12/11- 02/25/2012). Presently she is been observed and presently has no evidence of obvious disease by MRI scan of the brain or CT scans.  3. DVT left leg with pulmonary embolus, S/P >1 year worth of anticoagulation. Thought to be secondary to malignancy which is now in remission and she stopped Coumadin in July 2014.  4. S/P Port-A-Cath removal on 08/10/2012  5. S/P  IVC filter removed on 08/10/2012  6. Chronic constipation from poor diet and medications  7. Chronic pain in her legs and feet which is probably neuropathic. She did not do well with gabapentin and doesn't want to try that again. She is far better when she is sitting in a chair.   Patient Active Problem List   Diagnosis Date Noted  . Primary papillary thyroid carcinoma 11/21/2013  . Occlusion and stenosis of carotid artery without mention of cerebral infarction 07/18/2012  . DVT (deep venous thrombosis) 12/09/2011  . Pulmonary embolism 12/09/2011  . COPD with asthma 10/18/2011  . Allergy   . Fibromyalgia   . Pulmonary nodules   . Metastatic adenocarcinoma to brain, unknown  primary     PLAN:  1. I personally reviewed and went over laboratory results with the patient.  The results are noted within this dictation. 2. I personally reviewed and went over radiographic studies with the patient.  The results are noted within this dictation.   3. I personally reviewed and went over pathology results with the patient. 4. Will Move PET scan to the end of Sept 2015 in preparation for 01/03/2014 appointment with Dr. Vevelyn Royals ([facility]), per Dr. Amedeo Kinsman request 5. Will move MRI of brain w and wo contrast to end of Sept 2015 n preparation for 01/03/2014 appointment with Dr. Vevelyn Royals ([facility]), per Dr. Amedeo Kinsman request 6. Will add MRI of neck with contrast, per NCCN guidelines 7. Return in mid-October for follow-up and review of data and ENT treatment plan.    THERAPY PLAN:  The patient will undergo complete staging tests including a PET scan, MRI of brain, and MRI of neck.  Due to the size of the thyroid tumor, central neck lymph node dissection is indicated.  This should be followed by radioactive Iodine.  If no distant disease is identified on staging studies, there is no role for Lenvima (lenvatinib) and the patient can be followed with thyroglobulin levels and anti-thyroglobulin antibody.  All questions were  answered. The patient knows to call the clinic with any problems, questions or concerns. We can certainly see the patient much sooner if necessary.  Patient and plan discussed with Dr. Farrel Gobble and he is in agreement with the aforementioned.   KEFALAS,THOMAS 11/21/2013

## 2013-11-21 ENCOUNTER — Encounter (HOSPITAL_COMMUNITY): Payer: Medicare Other | Attending: Oncology | Admitting: Oncology

## 2013-11-21 ENCOUNTER — Ambulatory Visit (HOSPITAL_COMMUNITY): Payer: Medicare Other

## 2013-11-21 ENCOUNTER — Ambulatory Visit (HOSPITAL_COMMUNITY): Payer: Medicare Other | Admitting: Oncology

## 2013-11-21 VITALS — BP 126/70 | HR 59 | Temp 98.0°F | Resp 20 | Wt 177.0 lb

## 2013-11-21 DIAGNOSIS — K5900 Constipation, unspecified: Secondary | ICD-10-CM

## 2013-11-21 DIAGNOSIS — C7931 Secondary malignant neoplasm of brain: Secondary | ICD-10-CM

## 2013-11-21 DIAGNOSIS — Z86718 Personal history of other venous thrombosis and embolism: Secondary | ICD-10-CM

## 2013-11-21 DIAGNOSIS — Z86711 Personal history of pulmonary embolism: Secondary | ICD-10-CM

## 2013-11-21 DIAGNOSIS — C73 Malignant neoplasm of thyroid gland: Secondary | ICD-10-CM

## 2013-11-21 DIAGNOSIS — C7949 Secondary malignant neoplasm of other parts of nervous system: Secondary | ICD-10-CM

## 2013-11-21 DIAGNOSIS — M79609 Pain in unspecified limb: Secondary | ICD-10-CM

## 2013-11-21 DIAGNOSIS — C770 Secondary and unspecified malignant neoplasm of lymph nodes of head, face and neck: Secondary | ICD-10-CM

## 2013-12-17 ENCOUNTER — Encounter: Payer: Self-pay | Admitting: Vascular Surgery

## 2013-12-18 ENCOUNTER — Ambulatory Visit (HOSPITAL_COMMUNITY)
Admission: RE | Admit: 2013-12-18 | Discharge: 2013-12-18 | Disposition: A | Discharge status: Home / Self Care | Payer: Medicare Other | Source: Ambulatory Visit | Attending: Vascular Surgery | Admitting: Vascular Surgery

## 2013-12-18 ENCOUNTER — Ambulatory Visit (INDEPENDENT_AMBULATORY_CARE_PROVIDER_SITE_OTHER): Payer: Medicare Other | Admitting: Vascular Surgery

## 2013-12-18 ENCOUNTER — Encounter: Payer: Self-pay | Admitting: Vascular Surgery

## 2013-12-18 VITALS — BP 125/79 | HR 56 | Resp 18 | Ht 63.0 in | Wt 179.0 lb

## 2013-12-18 DIAGNOSIS — I6529 Occlusion and stenosis of unspecified carotid artery: Secondary | ICD-10-CM | POA: Diagnosis not present

## 2013-12-18 NOTE — Progress Notes (Signed)
The patient is today for followup of her asymptomatic carotid stenosis. Have seen her in April 2014. At that time showed outlying duplex suggesting moderate carotid disease. She continues to have no neurologic deficits. Specifically no amaurosis fugax, transient ischemic sac or stroke. This is a new primary cancer her thyroid has been treated by this. She does have fullness in her back related to this.  Past Medical History  Diagnosis Date  . Hypertension   . PONV (postoperative nausea and vomiting)   . Anxiety   . Depression   . COPD (chronic obstructive pulmonary disease)   . GERD (gastroesophageal reflux disease)   . Headache(784.0)   . Arthritis   . Adenocarcinoma 09/28/2011  . Hx of adenomatous colonic polyps   . Pulmonary nodules   . Diabetes mellitus   . Emphysema   . 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 Allergy     hips and shoulders  . Cataract     1 lens implaqnt right eye,intact on left eye  . Goiter     nodule watching  . Fibromyalgia     neuropathy tops of both feet  . Metastasis to brain 09/15/11     left frontoparietal region  . History of radiation therapy 10/06/11    1 fraction  15 gray  . Pulmonary emboli 11/04/11    right upper lobe and r lower lobe PE  . Lung nodule seen on imaging study 11/04/11 CT    61mm LLL  . DVT (deep venous thrombosis) 12/09/2011  . Pulmonary embolism 12/09/2011  . Hyperlipidemia   . History of radiation therapy 10/06/11    SRS 15Gy 36fx, brain  . Metastatic adenocarcinoma to brain, unknown primary   . Thyroid cancer 2015    History  Substance Use Topics  . Smoking status: Former Smoker -- 1.00 packs/day for 40 years    Types: Cigarettes    Quit date: 04/06/2011  . Smokeless tobacco: Never Used  . Alcohol Use: No    Family History  Problem Relation Age of Onset  . Asthma Mother   . Kidney failure Father   . Diabetes Sister   . Heart attack Sister   . Colon cancer Brother   . COPD Sister   .  Aneurysm Paternal Grandmother     brain  . Parkinsonism Maternal Uncle   . COPD Brother     Allergies  Allergen Reactions  . Penicillins Swelling    " my brain swelled, and mymouth"  . Codeine Itching    Mild takes benadryl    Current outpatient prescriptions:ALPRAZolam (XANAX) 0.5 MG tablet, Take 0.5 mg by mouth 3 (three) times daily as needed for anxiety. , Disp: , Rfl: ;  aspirin 81 MG chewable tablet, Chew 81 mg by mouth daily., Disp: , Rfl: ;  cetirizine (ZYRTEC) 10 MG tablet, Take 10 mg by mouth daily., Disp: , Rfl: ;  Cholecalciferol (VITAMIN D-3 PO), Take 2,000 Units by mouth daily., Disp: , Rfl:  esomeprazole (NEXIUM) 40 MG capsule, Take 40 mg by mouth daily before breakfast., Disp: , Rfl: ;  fish oil-omega-3 fatty acids 1000 MG capsule, Take 1 g by mouth 3 (three) times daily., Disp: , Rfl: ;  HYDROcodone-acetaminophen (NORCO) 7.5-325 MG per tablet, Take 1 tablet by mouth every 6 (six) hours as needed for pain., Disp: , Rfl:  ibuprofen (ADVIL,MOTRIN) 800 MG tablet, Take 800 mg by mouth every 8 (eight) hours as needed for pain. , Disp: ,  Rfl: ;  levothyroxine (SYNTHROID, LEVOTHROID) 112 MCG tablet, Take 125 mcg by mouth daily. , Disp: , Rfl: ;  LORazepam (ATIVAN) 1 MG tablet, Take 1 mg by mouth every 8 (eight) hours as needed for anxiety. , Disp: , Rfl: ;  Magnesium 250 MG TABS, Take 1 tablet by mouth 4 (four) times daily., Disp: , Rfl:  metoprolol tartrate (LOPRESSOR) 25 MG tablet, Take 25 mg by mouth 2 (two) times daily., Disp: , Rfl: ;  olmesartan (BENICAR) 20 MG tablet, Take 20 mg by mouth daily. Takes 1/4 tablet  daily, Disp: , Rfl: ;  oxyCODONE (OXY IR/ROXICODONE) 5 MG immediate release tablet, Take 5 mg by mouth every 4 (four) hours as needed., Disp: , Rfl: ;  polyethylene glycol (MIRALAX / GLYCOLAX) packet, Take 17 g by mouth daily. For constipation, Disp: , Rfl:  simvastatin (ZOCOR) 10 MG tablet, Take 10 mg by mouth at bedtime., Disp: , Rfl: ;  zolpidem (AMBIEN) 10 MG tablet, Take  10 mg by mouth at bedtime as needed for sleep., Disp: , Rfl: ;  divalproex (DEPAKOTE) 500 MG DR tablet, Take 500 mg by mouth daily. , Disp: , Rfl: ;  warfarin (COUMADIN) 5 MG tablet, Take 5 mg by mouth daily. 7mg  daily, Disp: , Rfl:   BP 125/79  Pulse 56  Resp 18  Ht 5\' 3"  (1.6 m)  Wt 179 lb (81.194 kg)  BMI 31.72 kg/m2  Body mass index is 31.72 kg/(m^2).       On physical exam she does have fullness in her neck and supraclavicular area bilaterally related to her cancer treatment. Her carotid arteries without bruits bilaterally She is grossly intact neurologically 2+ radial pulses bilaterally  Chin will repeat carotid duplex today and I reviewed this with the patient and her husband present. This shows a much lower level of carotid narrowing seen in her prior outlying study. She is no significant stenosis with less than 40% stenoses bilaterally. I have recommended discontinuation of carotid followup since she has essentially normal study. She was relieved with this especially given her other multiple health issues. I did review symptoms of stroke and TIA with the patient she will notify us should this occur. Otherwise we will see her again as needed

## 2013-12-24 ENCOUNTER — Encounter (HOSPITAL_COMMUNITY): Payer: Self-pay

## 2013-12-24 ENCOUNTER — Ambulatory Visit (HOSPITAL_COMMUNITY)
Admission: RE | Admit: 2013-12-24 | Discharge: 2013-12-24 | Disposition: A | Discharge status: Home / Self Care | Payer: Medicare Other | Source: Ambulatory Visit | Attending: Oncology | Admitting: Oncology

## 2013-12-24 DIAGNOSIS — R599 Enlarged lymph nodes, unspecified: Secondary | ICD-10-CM | POA: Diagnosis not present

## 2013-12-24 DIAGNOSIS — C7949 Secondary malignant neoplasm of other parts of nervous system: Secondary | ICD-10-CM | POA: Diagnosis not present

## 2013-12-24 DIAGNOSIS — N8189 Other female genital prolapse: Secondary | ICD-10-CM | POA: Diagnosis not present

## 2013-12-24 DIAGNOSIS — C3490 Malignant neoplasm of unspecified part of unspecified bronchus or lung: Secondary | ICD-10-CM | POA: Diagnosis not present

## 2013-12-24 DIAGNOSIS — R634 Abnormal weight loss: Secondary | ICD-10-CM | POA: Insufficient documentation

## 2013-12-24 DIAGNOSIS — I6529 Occlusion and stenosis of unspecified carotid artery: Secondary | ICD-10-CM | POA: Diagnosis not present

## 2013-12-24 DIAGNOSIS — E119 Type 2 diabetes mellitus without complications: Secondary | ICD-10-CM | POA: Insufficient documentation

## 2013-12-24 DIAGNOSIS — J438 Other emphysema: Secondary | ICD-10-CM | POA: Diagnosis not present

## 2013-12-24 DIAGNOSIS — C7931 Secondary malignant neoplasm of brain: Secondary | ICD-10-CM

## 2013-12-24 DIAGNOSIS — K7689 Other specified diseases of liver: Secondary | ICD-10-CM | POA: Insufficient documentation

## 2013-12-24 LAB — GLUCOSE, CAPILLARY: Glucose-Capillary: 131 mg/dL — ABNORMAL HIGH (ref 70–99)

## 2013-12-24 MED ORDER — FLUDEOXYGLUCOSE F - 18 (FDG) INJECTION: 8.9000 | Freq: Once | INTRAVENOUS | Status: AC | PRN

## 2013-12-27 ENCOUNTER — Other Ambulatory Visit (HOSPITAL_COMMUNITY): Payer: Self-pay | Admitting: Oncology

## 2013-12-27 ENCOUNTER — Ambulatory Visit (HOSPITAL_COMMUNITY)
Admission: RE | Admit: 2013-12-27 | Discharge: 2013-12-27 | Disposition: A | Discharge status: Home / Self Care | Payer: Medicare Other | Source: Ambulatory Visit | Attending: Oncology | Admitting: Oncology

## 2013-12-27 DIAGNOSIS — C73 Malignant neoplasm of thyroid gland: Secondary | ICD-10-CM | POA: Diagnosis not present

## 2013-12-27 DIAGNOSIS — Z9089 Acquired absence of other organs: Secondary | ICD-10-CM | POA: Diagnosis not present

## 2013-12-27 MED ORDER — GADOBENATE DIMEGLUMINE 529 MG/ML IV SOLN: 17.0000 mL | Freq: Once | INTRAVENOUS | Status: AC | PRN

## 2013-12-28 ENCOUNTER — Ambulatory Visit
Admission: RE | Admit: 2013-12-28 | Discharge: 2013-12-28 | Disposition: A | Discharge status: Home / Self Care | Payer: Medicare Other | Source: Ambulatory Visit | Attending: Radiation Oncology | Admitting: Radiation Oncology

## 2013-12-28 DIAGNOSIS — C7931 Secondary malignant neoplasm of brain: Secondary | ICD-10-CM

## 2013-12-28 DIAGNOSIS — C7949 Secondary malignant neoplasm of other parts of nervous system: Principal | ICD-10-CM

## 2013-12-28 MED ORDER — GADOBENATE DIMEGLUMINE 529 MG/ML IV SOLN
16.0000 mL | Freq: Once | INTRAVENOUS | Status: AC | PRN
  Administered 2013-12-28: 16 mL via INTRAVENOUS

## 2014-01-02 ENCOUNTER — Ambulatory Visit
Admission: RE | Admit: 2014-01-02 | Discharge: 2014-01-02 | Disposition: A | Discharge status: Home / Self Care | Payer: Medicare Other | Source: Ambulatory Visit | Attending: Radiation Oncology | Admitting: Radiation Oncology

## 2014-01-02 ENCOUNTER — Encounter: Payer: Self-pay | Admitting: Radiation Oncology

## 2014-01-02 VITALS — BP 130/70 | HR 67 | Temp 98.6°F | Resp 20 | Ht 63.0 in | Wt 181.9 lb

## 2014-01-02 DIAGNOSIS — C7931 Secondary malignant neoplasm of brain: Secondary | ICD-10-CM

## 2014-01-02 NOTE — Progress Notes (Addendum)
Follow up [facility] brain tx 10/24/11, MRI brain results 12/28/13, also patient had mr neck  Has thyroid cancer now, b/l neck swelling, did have surgery, MR neck =positve for metastatic disease,, patent pain 8/10, takes ibuprofen 8oo mg po prn, no headaches,nasuea, or dizzyness, no blured or vision changes, can swallow foods without difficulty, also had Pet scan 12/24/13 results here also, will have biospy left side neck tomorrow , and will see if both sides needs surgery on her neck,  Dr[facility] Cristi Loron at [facility] to [facility] patient 01/28/14 and have surgery  2:08 PM

## 2014-01-02 NOTE — Progress Notes (Signed)
Radiation Oncology         [phone] ________________________________  Name: Haley Lowe MRN: [id number]  Date: 01/02/2014  DOB: 1955-06-14  Follow-Up Visit Note  CC: Haley Lowe., MD  [employer], MD  Diagnosis:      Metastatic adenocarcinoma to brain, unknown primary   09/15/2011 Initial Diagnosis Metastatic adenocarcinoma to brain, unknown primary.  Resection from frontal brain lesion   09/30/2011 Imaging PET scan shows lung lesion and liver lesion   10/06/2011 -  Radiation Therapy Radiosurgery to the left fronto-parietal postoperative area: 15 Gy   10/13/2011 Imaging Negative EGD with negative biopsies.   11/12/2011 - 02/25/2012 Chemotherapy Carboplatin/Paclitaxel x 6 cycles.  Patient declined maintenance Alimta x 6 cycles   01/06/2012 Remission PET scan  demonstrates remission   07/26/2012 Remission PET scan demonstrates continued remission   08/06/2013 Remission CT CAP- negative for recurrence   08/17/2013 Imaging MRI brain- Stable appearance status post surgical resection of of left posterior frontal metastasis with subsequent XRT. No recurrent tumor.     Primary papillary thyroid carcinoma   09/27/2013 Initial Diagnosis Diagnosis THYROID, FINE NEEDLE ASPIRATION RIGHT, (SPECIMEN 1 OF 1, COLLECTED ON 09/27/2013): MALIGNANT (Haley Lowe). MALIGNANT CELLS CONSISTENT WITH CARCINOMA.   10/29/2013 Surgery Right and left thyroid lobectomy with R level 4 lymph node and 2 rR paratracheal lymph nodes positive for metastatic disease     Narrative:  The patient returns today for routine follow-up.  The patient currently is proceeding with further management for thyroid cancer. She recently had a PET scan as well as an MRI scan of the neck. She is going to proceed with additional surgery to the neck in the near future at [facility].  She is also followed by medical  Oncology at [facility]. The patient denies any difficulties with headache. No nausea. She has undergone a recent MRI  scan of the brain she presents today for discussion of this refe in 2013 done very well in terms of local control since that time.rral. The patient underwent postoperative radiosurgery                               ALLERGIES:  is allergic to penicillins and codeine.  Meds: Current Outpatient Prescriptions  Medication Sig Dispense Refill  . ALPRAZolam (XANAX) 0.5 MG tablet Take 0.5 mg by mouth 3 (three) times daily as needed for anxiety.       Marland Kitchen aspirin 81 MG chewable tablet Chew 81 mg by mouth daily.      . cetirizine (ZYRTEC) 10 MG tablet Take 10 mg by mouth daily.      . Cholecalciferol (VITAMIN D-3 PO) Take 2,000 Units by mouth daily.      Marland Kitchen esomeprazole (NEXIUM) 40 MG capsule Take 40 mg by mouth daily before breakfast.      . fish oil-omega-3 fatty acids 1000 MG capsule Take 1 g by mouth 3 (three) times daily.      Marland Kitchen ibuprofen (ADVIL,MOTRIN) 800 MG tablet Take 800 mg by mouth every 8 (eight) hours as needed for pain.       Marland Kitchen levothyroxine (SYNTHROID, LEVOTHROID) 112 MCG tablet Take 125 mcg by mouth daily.       Marland Kitchen LORazepam (ATIVAN) 1 MG tablet Take 1 mg by mouth every 8 (eight) hours as needed for anxiety.       . metoprolol tartrate (LOPRESSOR) 25 MG tablet Take 25 mg by mouth 2 (two) times  daily.      . olmesartan (BENICAR) 20 MG tablet Take 20 mg by mouth daily. Takes 1/4 tablet  daily      . polyethylene glycol (MIRALAX / GLYCOLAX) packet Take 17 g by mouth daily. For constipation      . simvastatin (ZOCOR) 10 MG tablet Take 10 mg by mouth at bedtime.      Marland Kitchen zolpidem (AMBIEN) 10 MG tablet Take 10 mg by mouth at bedtime as needed for sleep.      Marland Kitchen HYDROcodone-acetaminophen (NORCO) 7.5-325 MG per tablet Take 1 tablet by mouth every 6 (six) hours as needed for pain.      Marland Kitchen oxyCODONE (OXY IR/ROXICODONE) 5 MG immediate release tablet Take 5 mg by mouth every 4 (four) hours as needed.       No current facility-administered medications for this encounter.    Physical Findings: The  patient is in no acute distress. Patient is alert and oriented.  height is 5\' 3"  (1.6 m) and weight is 181 lb 14.4 oz (82.509 kg). Her oral temperature is 98.6 F (37 C). Her blood pressure is 130/70 and her pulse is 67. Her respiration is 20 and oxygen saturation is 99%. .   General: Well-developed, in no acute distress HEENT: Normocephalic, atraumatic Cardiovascular: Regular rate and rhythm Respiratory: Clear to auscultation bilaterally GI: Soft, nontender, normal bowel sounds Extremities: No edema present   Lab Findings: Lab Results  Component Value Date   WBC 6.7 08/03/2013   HGB 13.9 08/03/2013   HCT 42.9 08/03/2013   MCV 85.8 08/03/2013   PLT 279 08/03/2013     Radiographic Findings: Mr Haley Lowe Wo Contrast  12/28/2013   CLINICAL DATA:  58 year old female with intracranial metastatic lung adenocarcinoma treated with surgery and radiation. Restaging. Subsequent encounter.  EXAM: MRI HEAD WITHOUT AND WITH CONTRAST  TECHNIQUE: Multiplanar, multiecho pulse sequences of the brain and surrounding structures were obtained without and with intravenous contrast.  CONTRAST:  77mL MULTIHANCE GADOBENATE DIMEGLUMINE 529 MG/ML IV SOLN  COMPARISON:  08/17/2013 and earlier.  FINDINGS: Continued stability of the small left superior frontal gyrus resection cavity with overlying craniotomy changes. No associated abnormal enhancement.  Stable appearance of the brain elsewhere, no abnormal enhancement.  Major intracranial vascular flow voids are stable. No restricted diffusion to suggest acute infarction. No midline shift, mass effect, evidence of mass lesion, ventriculomegaly, extra-axial collection or acute intracranial hemorrhage. Cervicomedullary junction and pituitary are within normal limits. Stable gray and white matter signal throughout the brain. Normal visible bone marrow signal. Visualized upper cervical spine appears normal. No acute scalp soft tissue findings. Stable orbits soft tissues. Visualized  paranasal sinuses and mastoids are clear. Visible internal auditory structures appear normal.  IMPRESSION: Satisfactory and stable since 2014 post treatment appearance of the brain. No new intracranial abnormality.   Electronically Signed   By: Lars Pinks M.D.   On: 12/28/2013 14:06   Mr Neck Soft Tissue Only W Wo Contrast  12/27/2013   CLINICAL DATA:  Metastatic papillary thyroid cancer. Thyroidectomy 10/29/2013 at with right level 4 and right peritracheal lymph nodes positive for metastatic disease.  EXAM: MR NECK SOFT TISSUE ONLY WITHOUT AND WITH CONTRAST  TECHNIQUE: Multiplanar, multisequence MR imaging was performed both before and after administration of intravenous contrast.  CONTRAST:  17 mL MultiHance  COMPARISON:  PET-CT 12/24/2013.  Neck CT 09/24/2013.  FINDINGS: Images are mildly degraded by motion artifact.  As described on the prior neck CT. There are multiple small right anterior  cervical lymph nodes with enhancement/ inflammatory changes in the surrounding fat. This surrounding enhancement and inflammatory change makes precise lymph node localization and measurement somewhat difficult. Right level II lymph nodes measure up to approximately 9 mm in short axis, similar to prior neck CT. A right level III lymph node measures 7 mm in short axis, also similar to prior CT. Subcentimeter right level IV lymph nodes are present. Low left jugular lymph node described on the PET-CT is not well seen.  Sequelae of interval thyroidectomy are identified mild enhancement in the thyroidectomy bed may be postoperative without a definite discrete mass identified. Submandibular and parotid glands are unremarkable. No gross mass is identified in the nasopharynx, oropharynx, or oral cavity. Larynx is unremarkable. Mild cervical spondylosis is noted. The visualized portion of the brain is grossly unremarkable.  IMPRESSION: Multiple small but asymmetric right-sided cervical lymph nodes, suspicious for metastatic disease.    Electronically Signed   By: Logan Bores   On: 12/27/2013 17:42   Nm Pet Image Restag (ps) Skull Base To Thigh  12/24/2013   CLINICAL DATA:  Subsequent treatment strategy for restaging of metastatic adenocarcinoma. Lung primary. Metastasis to brain. Weight loss. Diabetes. Emphysema.  EXAM: NUCLEAR MEDICINE PET SKULL BASE TO THIGH  TECHNIQUE: 8.9 mCi F-18 FDG was injected intravenously. Full-ring PET imaging was performed from the skull base to thigh after the radiotracer. CT data was obtained and used for attenuation correction and anatomic localization.  FASTING BLOOD GLUCOSE:  Value: 131 mg/dl  COMPARISON:  07/26/2012. Neck CT of 09/24/2013. 08/17/2013 brain MR. 08/06/2013 chest, abdomen, and pelvic CTs.  FINDINGS: NECK  Right greater than left hypermetabolic cervical adenopathy. Right level 2 node measures 1.2 cm and a S.U.V. max of 7.0 on image 41.  A right supraclavicular node measures 8 mm and a S.U.V. max of 5.1 on image 46. This node is new since the prior PET.  A low left jugular node measures 1.0 cm and a S.U.V. max of 4.5 on image 46.  CHEST  Hypermetabolism about the left suprahilar region. Suspicion of developing soft tissue fullness in this area. This measures a S.U.V. max of 6.1 on image 76.  Left infrahilar hypermetabolism. Possible adenopathy developing in this area. Example at 1.4 cm and a S.U.V. max of 5.4 on image 81.  ABDOMEN/PELVIS  No areas of abnormal hypermetabolism.  SKELETON  No abnormal marrow activity.  CT IMAGES PERFORMED FOR ATTENUATION CORRECTION  Carotid atherosclerosis. Centrilobular emphysema. Non aneurysmal aortic dilatation. 2.6 cm maximally in the infrarenal segment. Age advanced aortic and branch vessel atherosclerosis. Mild to moderate hepatic steatosis with more focal steatosis adjacent to the falciform. Cholecystectomy. Hysterectomy. Pelvic floor laxity.  IMPRESSION: 1. Right greater than left hypermetabolic cervical nodes. Given persistence since the prior neck CT,  suspicious for metastatic disease. 2. Hypermetabolic foci about the left suprahilar and infrahilar region. Suspect developing soft tissue fullness/adenopathy in these areas. Favor nodal metastasis. 3. No evidence of subdiaphragmatic disease. 4. Incidental findings, including atherosclerosis, hepatic steatosis, and pelvic floor laxity.   Electronically Signed   By: Abigail Miyamoto M.D.   On: 12/24/2013 16:01    Impression:    I have had a chance to personally review the patient's recent brain MRI scan. This looked good without any evidence of local recurrence/progression and no new intracranial abnormalities. The patient is proceeding with further management for thyroid cancer.   Plan:  The patient will undergo a repeat MRI scan of the brain in 4 months, then followup appointment.  The patient I would be happy to see her sooner for any reason if necessary.  The patient was seen for 10 minutes today, the majority of which was spent counseling her on her diagnosis of cancer and coordinating her care.      Jodelle Gross, M.D., Ph.D.

## 2014-01-04 ENCOUNTER — Other Ambulatory Visit: Payer: Medicare Other

## 2014-01-12 NOTE — Progress Notes (Signed)
[name], MD [place] [address]  Primary papillary thyroid carcinoma  Metastatic adenocarcinoma to brain, unknown primary  CURRENT THERAPY: Complete staging for metastatic thyroid cancer.  INTERVAL HISTORY: Haley Lowe 58 y.o. female returns for  regular  visit for followup of recent diagnosis of metastatic papillary thyroid cancer, followed by Dr. Vevelyn Royals ([facility]) after undergoing complete staging scans including MRI of neck and PET scan. 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 S/P surgery followed by radiation therapy as well as chemotherapy consisting of carboplatin/paclitaxel x6 cycles (11/12/11- 02/25/2012). She was offered maintenance Alimta which she has declined. Presently she is been observed and presently has no evidence of obvious disease by MRI scan of the brain or CT scans.    Metastatic adenocarcinoma to brain, unknown primary   09/15/2011 Initial Diagnosis Metastatic adenocarcinoma to brain, unknown primary.  Resection from frontal brain lesion   09/30/2011 Imaging PET scan shows lung lesion and liver lesion   10/06/2011 -  Radiation Therapy Radiosurgery to the left fronto-parietal postoperative area: 15 Gy   10/13/2011 Imaging Negative EGD with negative biopsies.   11/12/2011 - 02/25/2012 Chemotherapy Carboplatin/Paclitaxel x 6 cycles.  Patient declined maintenance Alimta x 6 cycles   01/06/2012 Remission PET scan  demonstrates remission   07/26/2012 Remission PET scan demonstrates continued remission   08/06/2013 Remission CT CAP- negative for recurrence   08/17/2013 Imaging MRI brain- Stable appearance status post surgical resection of of left posterior frontal metastasis with subsequent XRT. No recurrent tumor.    12/28/2013 Imaging MRI brain- Satisfactory and stable since 2014 post treatment appearance of the brain. No new intracranial abnormality.    Primary papillary thyroid carcinoma   09/27/2013 Initial  Diagnosis Diagnosis THYROID, FINE NEEDLE ASPIRATION RIGHT, (SPECIMEN 1 OF 1, COLLECTED ON 09/27/2013): MALIGNANT (BETHESDA CLASS VI). MALIGNANT CELLS CONSISTENT WITH CARCINOMA.   10/29/2013 Surgery Right and left thyroid lobectomy with R level 4 lymph node and 2 rR paratracheal lymph nodes positive for metastatic disease   12/24/2013 Imaging PET- R greater than left hypermetabolic cervical nodes. Given persistence suspicious for metastatic disease. Hypermetabolic foci of L  suprahilar and infrahilar region. Favor nodal metastases.   12/27/2013 Imaging MRI neck- Multiple small but asymmetric right-sided cervical lymph nodes, suspicious for metastatic disease.   12/28/2013 Imaging MRI brain- Satisfactory and stable since 2014 post treatment appearance of the brain. No new intracranial abnormality.      Chart reviewed  I personally reviewed and went over radiographic studies with the patient.  The results are noted within this dictation.  MRI of brain demonstrated the following on 12/28/2013:Satisfactory and stable since 2014 post treatment appearance of the brain. No new intracranial abnormality.  MRI of neck on 12/27/2013 illustrated:Multiple small but asymmetric right-sided cervical lymph nodes, suspicious for metastatic disease.  PET scan on 12/24/2013 illustrated the following areas with hypermetabolic activity: 1. Right greater than left hypermetabolic cervical nodes. Given persistence since the prior neck CT, suspicious for metastatic disease.  2. Hypermetabolic foci about the left suprahilar and infrahilar region. Suspect developing soft tissue fullness/adenopathy in these areas. Favor nodal metastasis.  3. No evidence of subdiaphragmatic disease.  With this information, we have multiple areas of concern.  We know about the cervical adenopathy worrisome for metastatic papillary carcinoma, but the PET scan illustrates a left suprahilar and infrahilar region that is favoring nodal metastasis.  These  will need to be biopsied at some time and can be performed following her neck  dissection.  Her neck dissection is scheduled with Dr. Vevelyn Royals at [facility] for 01/28/2014.  I have reviewed all of this with the patient and family and they are agreeable with this plan.  I have reviewed the NCCN guidelines for total thyroidectomy which she already had in the past by Dr. Vevelyn Royals.  Indications for total thyroidectomy include any of the following: known distant metastases, bilateral nodularity, extrathyroidal extension, Tumor > 4cm in diameter, cervical lymph node metastases, poorly differentiated tumor, and past radiation history.  NCCN guidelines further recommends a therapeutic neck dissection of involved compartments for clinically apparent/biopsy-proven disease.  Therefore, she qualifies for total thyroidectomy (already done) and lymph node dissection as recommended by Dr. Vevelyn Royals ([facility]).    Clinically positive and/or biopsy-proven nodal metastases should be treated with formal compartmental resection.  In the central neck, this is achieved through unilateral or bilateral level VI dissection.  In the lateral compartment, a formal modified radical neck dissection including levels II, III, IV, and Vb should be performed.  Extending the dissection field into levels I or Va may be necessary when these levels are clinically involved.  If the cervical lymph nodes are clinically negative, prophylactic central neck dissection (Level VI) can be considered (category 2B) but is not required.  Selective dissection of individual nodal metastases (ie cherry picking) is not considered adequate surgery for nodal disease in a previously undissected field.  A study in [id number] patients found that total thyroidectomy reduced recurrence rates and improves overall survival in patient with papillary thyroid cancer of 1 cm or more when compared to lobectomy.  For patients with metastatic disease that does not concentrate radioactive iodine (RAI)  (ie refractory to RAI), NCCN guidelines recommends individualized treatment based on tumor location(s) (eg CNS, bone, soft tissue).  Lenvatinib or sorafenib may be considered for progressive and/or symptomatic disease based upon phase 3 data.  Lenvatinib is the preferred agent in this setting due to its 65% response rate (compared to 12% in sorafenib) and 18.3 month progression free survival versus placebo (compared to 10.8 months with sorafenib compared to placebo).  Other agents to consider include axitinib, pazopanib, sunitinib, and vandetanib- although none are approved for thyroid cancer.  Of note, kinase inhibitor therapy may not be appropriate for patients with stable or slowly progressive indolent disease.  Active surveillance may be appropriate for asymptomatic patients with indolent disease.  Oncologically, she denies any complaints and ROS questioning is negative.  She is fearful of chemotherapy following surgery, but she was advised that NCCN guidelines recommends radioactive iodine treatment post-op before considering systemic TKI therapy.  Past Medical History  Diagnosis Date  . Hypertension   . PONV (postoperative nausea and vomiting)   . Anxiety   . Depression   . COPD (chronic obstructive pulmonary disease)   . GERD (gastroesophageal reflux disease)   . Headache(784.0)   . Arthritis   . Adenocarcinoma 09/28/2011  . Hx of adenomatous colonic polyps   . Pulmonary nodules   . Diabetes mellitus   . Emphysema   . 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 Allergy     hips and shoulders  . Cataract     1 lens implaqnt right eye,intact on left eye  . Goiter     nodule watching  . Fibromyalgia     neuropathy tops of both feet  . Metastasis to brain 09/15/11     left frontoparietal region  . History of radiation therapy 10/06/11  1 fraction  15 gray  . Pulmonary emboli 11/04/11    right upper lobe and r lower lobe PE  . Lung nodule seen on imaging  study 11/04/11 CT    43mm LLL  . DVT (deep venous thrombosis) 12/09/2011  . Pulmonary embolism 12/09/2011  . Hyperlipidemia   . History of radiation therapy 10/06/11    [facility]RS 15Gy 77fx, brain  . Metastatic adenocarcinoma to brain, unknown primary   . Thyroid cancer 2015    has Pulmonary nodules; Metastatic adenocarcinoma to brain, unknown primary; Allergy; Fibromyalgia; COPD with asthma; DVT (deep venous thrombosis); Pulmonary embolism; Occlusion and stenosis of carotid artery without mention of cerebral infarction; and Primary papillary thyroid carcinoma on her problem list.     is allergic to penicillins and codeine.  Ms. Shubert does not currently have medications on file.  Past Surgical History  Procedure Laterality Date  . Abdominal hysterectomy  1996  . Cholecystectomy  2000  . Shoulder surgery  1998    right  . Eye surgery  2001    vision correction  . Foot surgery  2005    left  . Craniotomy  09/15/2011    Procedure: CRANIOTOMY TUMOR EXCISION;  Surgeon: Hosie Spangle, MD;  Location: [place] [facility];  Service: Neurosurgery;  Laterality: N/A;  Craniotomy resection of tumor with stealth  . Cataract extraction  2011    with lens implant  . Total thyroidectomy  10-29-2013    [facility]    Denies any headaches, dizziness, double vision, fevers, chills, night sweats, nausea, vomiting, diarrhea, constipation, chest pain, heart palpitations, shortness of breath, blood in stool, black tarry stool, urinary pain, urinary burning, urinary frequency, hematuria.   PHYSICAL EXAMINATION  ECOG PERFORMANCE STATUS: 1 - Symptomatic but completely ambulatory  Filed Vitals:   01/15/14 1500  BP: 130/75  Pulse: 61  Temp: 97.8 F (36.6 C)  Resp: 18    GENERAL:alert, no distress, well developed, comfortable, cooperative, obese and smiling SKIN: skin color, texture, turgor are normal, no rashes or significant lesions HEAD: Normocephalic, No masses, lesions, tenderness or  abnormalities EYES: normal, PERRLA, EOMI, Conjunctiva are pink and non-injected EARS: External ears normal OROPHARYNX:mucous membranes are moist  NECK: supple LYMPH:  not examined BREAST:not examined LUNGS: not examined HEART: not examined ABDOMEN:not examined BACK: not examined EXTREMITIES:less then 2 second capillary refill, no skin discoloration, no cyanosis  NEURO: alert & oriented x 3 with fluent speech, no focal motor/sensory deficits, gait normal   LABORATORY DATA: CBC    Component Value Date/Time   WBC 6.7 08/03/2013 0950   WBC 14.4* 03/03/2012 1033   RBC 5.00 08/03/2013 0950   RBC 3.61* 03/03/2012 1033   HGB 13.9 08/03/2013 0950   HGB 11.8 03/03/2012 1033   HCT 42.9 08/03/2013 0950   HCT 35.1 03/03/2012 1033   PLT 279 08/03/2013 0950   PLT 161 03/03/2012 1033   MCV 85.8 08/03/2013 0950   MCV 97.2 03/03/2012 1033   MCH 27.8 08/03/2013 0950   MCH 32.7 03/03/2012 1033   MCHC 32.4 08/03/2013 0950   MCHC 33.6 03/03/2012 1033   RDW 13.4 08/03/2013 0950   RDW 15.1* 03/03/2012 1033   LYMPHSABS 1.6 08/03/2013 0950   LYMPHSABS 2.4 03/03/2012 1033   MONOABS 0.7 08/03/2013 0950   MONOABS 1.1* 03/03/2012 1033   EOSABS 0.1 08/03/2013 0950   EOSABS 0.2 03/03/2012 1033   BASOSABS 0.0 08/03/2013 0950   BASOSABS 0.1 03/03/2012 1033      Chemistry  Component Value Date/Time   NA 140 08/03/2013 0950   NA 140 03/03/2012 1033   K 4.2 08/03/2013 0950   K 3.8 03/03/2012 1033   CL 103 08/03/2013 0950   CL 101 03/03/2012 1033   CO2 26 08/03/2013 0950   CO2 30* 03/03/2012 1033   BUN 15 08/03/2013 0950   BUN 15.0 03/03/2012 1033   CREATININE 0.64 08/03/2013 0950   CREATININE 0.6 03/03/2012 1033      Component Value Date/Time   CALCIUM 9.6 08/03/2013 0950   CALCIUM 9.2 03/03/2012 1033   ALKPHOS 112 08/03/2013 0950   ALKPHOS 115 03/03/2012 1033   AST 16 08/03/2013 0950   AST 20 03/03/2012 1033   ALT 20 08/03/2013 0950   ALT 44 03/03/2012 1033   BILITOT 0.3 08/03/2013 0950   BILITOT 0.31 03/03/2012 1033        RADIOGRAPHIC STUDIES:  12/28/2013  CLINICAL DATA: 58 year old female with intracranial metastatic lung  adenocarcinoma treated with surgery and radiation. Restaging.  Subsequent encounter.  EXAM:  MRI HEAD WITHOUT AND WITH CONTRAST  TECHNIQUE:  Multiplanar, multiecho pulse sequences of the brain and surrounding  structures were obtained without and with intravenous contrast.  CONTRAST: 34mL MULTIHANCE GADOBENATE DIMEGLUMINE 529 MG/ML IV SOLN  COMPARISON: 08/17/2013 and earlier.  FINDINGS:  Continued stability of the small left superior frontal gyrus  resection cavity with overlying craniotomy changes. No associated  abnormal enhancement.  Stable appearance of the brain elsewhere, no abnormal enhancement.  Major intracranial vascular flow voids are stable. No restricted  diffusion to suggest acute infarction. No midline shift, mass  effect, evidence of mass lesion, ventriculomegaly, extra-axial  collection or acute intracranial hemorrhage. Cervicomedullary  junction and pituitary are within normal limits. Stable gray and  white matter signal throughout the brain. Normal visible bone marrow  signal. Visualized upper cervical spine appears normal. No acute  scalp soft tissue findings. Stable orbits soft tissues. Visualized  paranasal sinuses and mastoids are clear. Visible internal auditory  structures appear normal.  IMPRESSION:  Satisfactory and stable since 2014 post treatment appearance of the  brain. No new intracranial abnormality.  Electronically Signed  By: Lars Pinks M.D.  On: 12/28/2013 14:06   12/27/2013  CLINICAL DATA: Metastatic papillary thyroid cancer. Thyroidectomy  10/29/2013 at with right level 4 and right peritracheal lymph nodes  positive for metastatic disease.  EXAM:  MR NECK SOFT TISSUE ONLY WITHOUT AND WITH CONTRAST  TECHNIQUE:  Multiplanar, multisequence MR imaging was performed both before and  after administration of intravenous contrast.   CONTRAST: 17 mL MultiHance  COMPARISON: PET-CT 12/24/2013. Neck CT 09/24/2013.  FINDINGS:  Images are mildly degraded by motion artifact.  As described on the prior neck CT. There are multiple small right  anterior cervical lymph nodes with enhancement/ inflammatory changes  in the surrounding fat. This surrounding enhancement and  inflammatory change makes precise lymph node localization and  measurement somewhat difficult. Right level II lymph nodes measure  up to approximately 9 mm in short axis, similar to prior neck CT. A  right level III lymph node measures 7 mm in short axis, also similar  to prior CT. Subcentimeter right level IV lymph nodes are present.  Low left jugular lymph node described on the PET-CT is not well  seen.  Sequelae of interval thyroidectomy are identified mild enhancement  in the thyroidectomy bed may be postoperative without a definite  discrete mass identified. Submandibular and parotid glands are  unremarkable. No gross mass is identified in the  nasopharynx,  oropharynx, or oral cavity. Larynx is unremarkable. Mild cervical  spondylosis is noted. The visualized portion of the brain is grossly  unremarkable.  IMPRESSION:  Multiple small but asymmetric right-sided cervical lymph nodes,  suspicious for metastatic disease.  Electronically Signed  By: Logan Bores  On: 12/27/2013 17:42   12/24/2013  CLINICAL DATA: Subsequent treatment strategy for restaging of  metastatic adenocarcinoma. Lung primary. Metastasis to brain. Weight  loss. Diabetes. Emphysema.  EXAM:  NUCLEAR MEDICINE PET SKULL BASE TO THIGH  TECHNIQUE:  8.9 mCi F-18 FDG was injected intravenously. Full-ring PET imaging  was performed from the skull base to thigh after the radiotracer. CT  data was obtained and used for attenuation correction and anatomic  localization.  FASTING BLOOD GLUCOSE: Value: 131 mg/dl  COMPARISON: 07/26/2012. Neck CT of 09/24/2013. 08/17/2013 brain MR.   08/06/2013 chest, abdomen, and pelvic CTs.  FINDINGS:  NECK  Right greater than left hypermetabolic cervical adenopathy. Right  level 2 node measures 1.2 cm and a S.U.V. max of 7.0 on image 41.  A right supraclavicular node measures 8 mm and a S.U.V. max of 5.1  on image 46. This node is new since the prior PET.  A low left jugular node measures 1.0 cm and a S.U.V. max of 4.5 on  image 46.  CHEST  Hypermetabolism about the left suprahilar region. Suspicion of  developing soft tissue fullness in this area. This measures a S.U.V.  max of 6.1 on image 76.  Left infrahilar hypermetabolism. Possible adenopathy developing in  this area. Example at 1.4 cm and a S.U.V. max of 5.4 on image 81.  ABDOMEN/PELVIS  No areas of abnormal hypermetabolism.  SKELETON  No abnormal marrow activity.  CT IMAGES PERFORMED FOR ATTENUATION CORRECTION  Carotid atherosclerosis. Centrilobular emphysema. Non aneurysmal  aortic dilatation. 2.6 cm maximally in the infrarenal segment. Age  advanced aortic and branch vessel atherosclerosis. Mild to moderate  hepatic steatosis with more focal steatosis adjacent to the  falciform. Cholecystectomy. Hysterectomy. Pelvic floor laxity.  IMPRESSION:  1. Right greater than left hypermetabolic cervical nodes. Given  persistence since the prior neck CT, suspicious for metastatic  disease.  2. Hypermetabolic foci about the left suprahilar and infrahilar  region. Suspect developing soft tissue fullness/adenopathy in these  areas. Favor nodal metastasis.  3. No evidence of subdiaphragmatic disease.  4. Incidental findings, including atherosclerosis, hepatic  steatosis, and pelvic floor laxity.  Electronically Signed  By: Abigail Miyamoto M.D.  On: 12/24/2013 16:01     ASSESSMENT:  1. Metastatic papillary thyroid cancer, PET scan also demonstrates left infrahilar and suprahilar hypermetabolic activity.  This will need to be addressed in future. 2. Metastatic adenocarcinoma  to the brain suspected to be lung primary. S/P surgery followed by radiation therapy as well as chemotherapy consisting of carboplatin/paclitaxel x6 cycles (11/12/11- 02/25/2012). She was offered maintenance Alimta which she has declined. Presently she is been observed and presently has no evidence of obvious disease by MRI scan of the brain or CT scans. 3. DVT left leg with pulmonary embolus, S/P >1 year worth of anticoagulation. Thought to be secondary to malignancy which is now in remission and she stopped Coumadin in July 2014.  4. S/P Port-A-Cath removal on 08/10/2012  5. S/P IVC filter removed on 08/10/2012  6. Chronic constipation from poor diet and medications  7. Chronic pain in her legs and feet which is probably neuropathic. She did not do well with gabapentin and doesn't want to try that again. She is  far better when she is sitting in a chair.   Patient Active Problem List   Diagnosis Date Noted  . Primary papillary thyroid carcinoma 11/21/2013  . Occlusion and stenosis of carotid artery without mention of cerebral infarction 07/18/2012  . DVT (deep venous thrombosis) 12/09/2011  . Pulmonary embolism 12/09/2011  . COPD with asthma 10/18/2011  . Allergy   . Fibromyalgia   . Pulmonary nodules   . Metastatic adenocarcinoma to brain, unknown primary      PLAN:  1. I personally reviewed and went over laboratory results with the patient.  The results are noted within this dictation. 2. I personally reviewed and went over radiographic studies with the patient.  The results are noted within this dictation.   3. Chart reviewed 4. Outside notes reviewed, Dr. Amedeo Kinsman note from [facility] reviewed 5. Neck dissection surgery is scheduled for 01/28/2014 6. Review of NCCN guidelines pertaining to indications for neck lymph node dissection. 7. Message left with Dr. Vevelyn Royals ([facility]) for a return phone call to discuss patient's case- [phone].  Dr. Vevelyn Royals is out of office and message left with my pager  number so he may contact me tomorrow.  8. Following surgery, suprahilar and infrahilar hypermetabolic activity on PET scan will need evaluation. 9. Return at end of November or beginning of December for follow-up, review of pathology, and discussion regarding future intervention.   THERAPY PLAN:  The patient will undergo lymph node dissection of neck on 01/28/2014 by Dr. Vevelyn Royals at [facility] [phone]) and we will see her 4 weeks following surgery to formulate future plan of action; this will likely involve radioactive iodine.  All questions were answered. The patient knows to call the clinic with any problems, questions or concerns. We can certainly see the patient much sooner if necessary.  Patient and plan discussed with Dr. Farrel Gobble and he is in agreement with the aforementioned.   KEFALAS,THOMAS 01/15/2014

## 2014-01-15 ENCOUNTER — Encounter (HOSPITAL_COMMUNITY): Payer: Self-pay | Admitting: Oncology

## 2014-01-15 ENCOUNTER — Encounter (HOSPITAL_COMMUNITY): Payer: Medicare Other | Attending: Oncology | Admitting: Oncology

## 2014-01-15 VITALS — BP 130/75 | HR 61 | Temp 97.8°F | Resp 18 | Wt 182.9 lb

## 2014-01-15 DIAGNOSIS — C73 Malignant neoplasm of thyroid gland: Secondary | ICD-10-CM

## 2014-01-15 DIAGNOSIS — K5909 Other constipation: Secondary | ICD-10-CM

## 2014-01-15 DIAGNOSIS — C7931 Secondary malignant neoplasm of brain: Secondary | ICD-10-CM

## 2014-01-15 DIAGNOSIS — M79609 Pain in unspecified limb: Secondary | ICD-10-CM

## 2014-01-15 NOTE — Patient Instructions (Addendum)
[place] Discharge Instructions  RECOMMENDATIONS MADE BY THE CONSULTANT AND ANY TEST RESULTS WILL BE SENT TO YOUR REFERRING PHYSICIAN.  EXAM FINDINGS BY THE PHYSICIAN TODAY AND SIGNS OR SYMPTOMS TO REPORT TO CLINIC OR PRIMARY PHYSICIAN: You saw Haley Lowe today  Please call the clinic if you have any questions or concerns.  Surgery as planned.  Return visit to see Dr Barnet Glasgow after surgery.   Thank you for choosing [facility] to provide your oncology and hematology care.  To afford each patient quality time with our providers, please arrive at least 15 minutes before your scheduled appointment time.  With your help, our goal is to use those 15 minutes to complete the necessary work-up to ensure our physicians have the information they need to help with your evaluation and healthcare recommendations.    Effective January 1st, 2014, we ask that you re-schedule your appointment with our physicians should you arrive 10 or more minutes late for your appointment.  We strive to give you quality time with our providers, and arriving late affects you and other patients whose appointments are after yours.    Again, thank you for choosing [facility].  Our hope is that these requests will decrease the amount of time that you wait before being seen by our physicians.       _____________________________________________________________  Should you have questions after your visit to [facility], please contact our office at [phone] between the hours of 8:30 a.m. and 5:00 p.m.  Voicemails left after 4:30 p.m. will not be returned until the following business day.  For prescription refill requests, have your pharmacy contact our office with your prescription refill request.

## 2014-01-28 ENCOUNTER — Other Ambulatory Visit: Payer: Self-pay | Admitting: Radiation Therapy

## 2014-01-28 DIAGNOSIS — C7931 Secondary malignant neoplasm of brain: Secondary | ICD-10-CM

## 2014-01-31 ENCOUNTER — Encounter (HOSPITAL_COMMUNITY): Payer: Self-pay | Admitting: Emergency Medicine

## 2014-01-31 ENCOUNTER — Emergency Department (HOSPITAL_COMMUNITY)
Admission: EM | Admit: 2014-01-31 | Discharge: 2014-02-01 | Disposition: A | Discharge status: Home / Self Care | Payer: Medicare Other | Attending: Emergency Medicine | Admitting: Emergency Medicine

## 2014-01-31 DIAGNOSIS — M1990 Unspecified osteoarthritis, unspecified site: Secondary | ICD-10-CM | POA: Insufficient documentation

## 2014-01-31 DIAGNOSIS — F329 Major depressive disorder, single episode, unspecified: Secondary | ICD-10-CM | POA: Diagnosis not present

## 2014-01-31 DIAGNOSIS — Z86718 Personal history of other venous thrombosis and embolism: Secondary | ICD-10-CM | POA: Insufficient documentation

## 2014-01-31 DIAGNOSIS — Z923 Personal history of irradiation: Secondary | ICD-10-CM | POA: Diagnosis not present

## 2014-01-31 DIAGNOSIS — Z86711 Personal history of pulmonary embolism: Secondary | ICD-10-CM | POA: Insufficient documentation

## 2014-01-31 DIAGNOSIS — Z7982 Long term (current) use of aspirin: Secondary | ICD-10-CM | POA: Diagnosis not present

## 2014-01-31 DIAGNOSIS — I10 Essential (primary) hypertension: Secondary | ICD-10-CM | POA: Diagnosis not present

## 2014-01-31 DIAGNOSIS — R221 Localized swelling, mass and lump, neck: Secondary | ICD-10-CM | POA: Insufficient documentation

## 2014-01-31 DIAGNOSIS — Z8669 Personal history of other diseases of the nervous system and sense organs: Secondary | ICD-10-CM | POA: Diagnosis not present

## 2014-01-31 DIAGNOSIS — F419 Anxiety disorder, unspecified: Secondary | ICD-10-CM | POA: Diagnosis not present

## 2014-01-31 DIAGNOSIS — Z85850 Personal history of malignant neoplasm of thyroid: Secondary | ICD-10-CM | POA: Diagnosis not present

## 2014-01-31 DIAGNOSIS — J441 Chronic obstructive pulmonary disease with (acute) exacerbation: Secondary | ICD-10-CM | POA: Insufficient documentation

## 2014-01-31 DIAGNOSIS — Z880 Allergy status to penicillin: Secondary | ICD-10-CM | POA: Insufficient documentation

## 2014-01-31 DIAGNOSIS — E119 Type 2 diabetes mellitus without complications: Secondary | ICD-10-CM | POA: Diagnosis not present

## 2014-01-31 DIAGNOSIS — E785 Hyperlipidemia, unspecified: Secondary | ICD-10-CM | POA: Insufficient documentation

## 2014-01-31 DIAGNOSIS — Z85841 Personal history of malignant neoplasm of brain: Secondary | ICD-10-CM | POA: Diagnosis not present

## 2014-01-31 DIAGNOSIS — Z87891 Personal history of nicotine dependence: Secondary | ICD-10-CM | POA: Insufficient documentation

## 2014-01-31 DIAGNOSIS — R220 Localized swelling, mass and lump, head: Secondary | ICD-10-CM | POA: Diagnosis present

## 2014-01-31 DIAGNOSIS — Z86010 Personal history of colonic polyps: Secondary | ICD-10-CM | POA: Insufficient documentation

## 2014-01-31 LAB — CBC WITH DIFFERENTIAL/PLATELET
Basophils Absolute: 0.1 10*3/uL (ref 0.0–0.1)
Basophils Relative: 1 % (ref 0–1)
EOS ABS: 0.5 10*3/uL (ref 0.0–0.7)
Eosinophils Relative: 8 % — ABNORMAL HIGH (ref 0–5)
HCT: 37.5 % (ref 36.0–46.0)
Hemoglobin: 12 g/dL (ref 12.0–15.0)
Lymphocytes Relative: 27 % (ref 12–46)
Lymphs Abs: 1.6 10*3/uL (ref 0.7–4.0)
MCH: 28.7 pg (ref 26.0–34.0)
MCHC: 32 g/dL (ref 30.0–36.0)
MCV: 89.7 fL (ref 78.0–100.0)
MONO ABS: 0.6 10*3/uL (ref 0.1–1.0)
Monocytes Relative: 10 % (ref 3–12)
NEUTROS ABS: 3.2 10*3/uL (ref 1.7–7.7)
Neutrophils Relative %: 54 % (ref 43–77)
Platelets: 249 10*3/uL (ref 150–400)
RBC: 4.18 MIL/uL (ref 3.87–5.11)
RDW: 13.2 % (ref 11.5–15.5)
WBC: 6 10*3/uL (ref 4.0–10.5)

## 2014-01-31 LAB — COMPREHENSIVE METABOLIC PANEL
ALBUMIN: 3.4 g/dL — AB (ref 3.5–5.2)
ALK PHOS: 94 U/L (ref 39–117)
ALT: 19 U/L (ref 0–35)
ANION GAP: 14 (ref 5–15)
AST: 15 U/L (ref 0–37)
BILIRUBIN TOTAL: 0.2 mg/dL — AB (ref 0.3–1.2)
BUN: 13 mg/dL (ref 6–23)
CHLORIDE: 102 meq/L (ref 96–112)
CO2: 25 mEq/L (ref 19–32)
Calcium: 8.9 mg/dL (ref 8.4–10.5)
Creatinine, Ser: 0.75 mg/dL (ref 0.50–1.10)
GFR calc non Af Amer: >90 mL/min (ref >90)
Glucose, Bld: 110 mg/dL — ABNORMAL HIGH (ref 70–99)
Potassium: 4.6 mEq/L (ref 3.7–5.3)
Sodium: 141 mEq/L (ref 137–147)
Total Protein: 6.6 g/dL (ref 6.0–8.3)

## 2014-01-31 NOTE — ED Notes (Signed)
Pt. reports facial swelling / neck swelling onset yesterday , noticed JP drain at left neck is not draining this morning , airway intact/ respirations unlabored , denies fever or chills.

## 2014-02-01 ENCOUNTER — Encounter (HOSPITAL_COMMUNITY): Payer: Self-pay

## 2014-02-01 ENCOUNTER — Emergency Department (HOSPITAL_COMMUNITY): Payer: Medicare Other

## 2014-02-01 MED ORDER — DEXAMETHASONE SODIUM PHOSPHATE 10 MG/ML IJ SOLN
10.0000 mg | Freq: Once | INTRAMUSCULAR | Status: AC
  Administered 2014-02-01: 10 mg via INTRAVENOUS
  Filled 2014-02-01: qty 1

## 2014-02-01 MED ORDER — IOHEXOL 300 MG/ML  SOLN
75.0000 mL | Freq: Once | INTRAMUSCULAR | Status: AC | PRN
  Administered 2014-02-01: 75 mL via INTRAVENOUS

## 2014-02-01 NOTE — Discharge Instructions (Signed)
You were evaluated today for postoperative swelling of the neck. You were given steroids. There is no evidence of impending airway compromise at this time. The on-call physician at [facility] office was advised of your status. You're to follow-up immediately later today. If you have difficulty breathing, shortness of breath, increasing pain you should be evaluated immediately.

## 2014-02-01 NOTE — ED Provider Notes (Signed)
CSN: [id number]     Arrival date & time 01/31/14  2018 History   First MD Initiated Contact with Patient 01/31/14 2357     Chief Complaint  Patient presents with  . Facial Swelling     (Consider location/radiation/quality/duration/timing/severity/associated sxs/prior Treatment) HPI  This is a 58 year old female with a history of thyroid cancer with recent thyroid resection in July and bilateral lymph node dissection of the neck 4 days ago who presents with neck swelling. Patient presents with her family. Reports that she was discharged yesterday following bilateral neck dissection. She has a drain in her left neck.  At approximately 4 PM today patient noted increasing difficulty swallowing and swelling of the neck. She also noted that this morning her JP drain stopped draining. She feels like she is having difficulty breathing as well. She denies any chest pain or fevers.  ENT surgeon is Dr. Vevelyn Royals with [facility] in [place].  Past Medical History  Diagnosis Date  . Hypertension   . PONV (postoperative nausea and vomiting)   . Anxiety   . Depression   . COPD (chronic obstructive pulmonary disease)   . GERD (gastroesophageal reflux disease)   . Headache(784.0)   . Arthritis   . Adenocarcinoma 09/28/2011  . Hx of adenomatous colonic polyps   . Pulmonary nodules   . Diabetes mellitus   . Emphysema   . 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 Allergy     hips and shoulders  . Cataract     1 lens implaqnt right eye,intact on left eye  . Goiter     nodule watching  . Fibromyalgia     neuropathy tops of both feet  . Metastasis to brain 09/15/11     left frontoparietal region  . History of radiation therapy 10/06/11    1 fraction  15 gray  . Pulmonary emboli 11/04/11    right upper lobe and r lower lobe PE  . Lung nodule seen on imaging study 11/04/11 CT    48mm LLL  . DVT (deep venous thrombosis) 12/09/2011  . Pulmonary  embolism 12/09/2011  . Hyperlipidemia   . History of radiation therapy 10/06/11    SRS 15Gy 17fx, brain  . Metastatic adenocarcinoma to brain, unknown primary   . Thyroid cancer 2015   Past Surgical History  Procedure Laterality Date  . Abdominal hysterectomy  1996  . Cholecystectomy  2000  . Shoulder surgery  1998    right  . Eye surgery  2001    vision correction  . Foot surgery  2005    left  . Craniotomy  09/15/2011    Procedure: CRANIOTOMY TUMOR EXCISION;  Surgeon: Hosie Spangle, MD;  Location: [facility];  Service: Neurosurgery;  Laterality: N/A;  Craniotomy resection of tumor with stealth  . Cataract extraction  2011    with lens implant  . Total thyroidectomy  10-29-2013    [facility]   Family History  Problem Relation Age of Onset  . Asthma Mother   . Kidney failure Father   . Diabetes Sister   . Heart attack Sister   . Colon cancer Brother   . COPD Sister   . Aneurysm Paternal Grandmother     brain  . Parkinsonism Maternal Uncle   . COPD Brother    History  Substance Use Topics  . Smoking status: Former Smoker -- 1.00 packs/day for 40 years  Types: Cigarettes    Quit date: 04/06/2011  . Smokeless tobacco: Never Used  . Alcohol Use: No   OB History   Grav Para Term Preterm Abortions TAB SAB Ect Mult Living                 Review of Systems  Constitutional: Negative for fever.  HENT: Positive for trouble swallowing.        Neck swelling  Respiratory: Positive for shortness of breath. Negative for cough and chest tightness.   Cardiovascular: Negative for chest pain.  Gastrointestinal: Negative for abdominal pain.  Genitourinary: Negative for dysuria.  Musculoskeletal: Negative for neck pain.  Skin: Positive for wound.  Neurological: Negative for headaches.  All other systems reviewed and are negative.     Allergies  Penicillins; Codeine; and Tape  Home Medications   Prior to Admission medications   Medication Sig  Start Date End Date Taking? Authorizing Provider  ALPRAZolam Duanne Moron) 0.5 MG tablet Take 0.5 mg by mouth 3 (three) times daily as needed for anxiety.  01/04/12  Yes Historical Provider, MD  aspirin 81 MG chewable tablet Chew 81 mg by mouth daily.   Yes Historical Provider, MD  cetirizine (ZYRTEC) 10 MG tablet Take 10 mg by mouth daily.   Yes Historical Provider, MD  doxycycline (VIBRAMYCIN) 100 MG capsule Take 100 mg by mouth 2 (two) times daily. 01/28/14  Yes Historical Provider, MD  HYDROmorphone (DILAUDID) 4 MG tablet Take 4 mg by mouth every 6 (six) hours as needed for severe pain.   Yes Historical Provider, MD  ibuprofen (ADVIL,MOTRIN) 800 MG tablet Take 800 mg by mouth every 8 (eight) hours as needed for pain.    Yes Historical Provider, MD  levothyroxine (SYNTHROID, LEVOTHROID) 125 MCG tablet Take 125 mcg by mouth daily before breakfast.   Yes Historical Provider, MD  metoprolol tartrate (LOPRESSOR) 25 MG tablet Take 25 mg by mouth 2 (two) times daily.   Yes Historical Provider, MD  polyethylene glycol (MIRALAX / GLYCOLAX) packet Take 17 g by mouth daily. For constipation   Yes Historical Provider, MD  simvastatin (ZOCOR) 10 MG tablet Take 10 mg by mouth at bedtime.   Yes Historical Provider, MD  zolpidem (AMBIEN) 10 MG tablet Take 10 mg by mouth at bedtime as needed for sleep.   Yes Historical Provider, MD  Cholecalciferol (VITAMIN D-3 PO) Take 2,000 Units by mouth daily.    Historical Provider, MD   BP 154/83  Pulse 59  Temp(Src) 98.6 F (37 C) (Oral)  Resp 22  SpO2 95% Physical Exam  Nursing note and vitals reviewed. Constitutional: She is oriented to person, place, and time. She appears well-developed and well-nourished. No distress.  HENT:  Head: Normocephalic.  Mouth/Throat: Oropharynx is clear and moist.  No edema appreciated in the oropharyngeal space, uvula midline  Eyes: Pupils are equal, round, and reactive to light.  Neck:  Incision over the anterior neck, clean dry and  intact, diffuse swelling noted of the anterior and lateral neck extending into the supraclavicular region, no stridor noted, no tracheal deviation noted  Cardiovascular: Normal rate, regular rhythm and normal heart sounds.   No murmur heard. Pulmonary/Chest: Effort normal and breath sounds normal. No respiratory distress. She has no wheezes.  Musculoskeletal: She exhibits no edema.  Neurological: She is alert and oriented to person, place, and time.  Skin: Skin is warm and dry.  Psychiatric: She has a normal mood and affect.    ED Course  Procedures (including critical  care time) Labs Review Labs Reviewed  CBC WITH DIFFERENTIAL - Abnormal; Notable for the following:    Eosinophils Relative 8 (*)    All other components within normal limits  COMPREHENSIVE METABOLIC PANEL - Abnormal; Notable for the following:    Glucose, Bld 110 (*)    Albumin 3.4 (*)    Total Bilirubin 0.2 (*)    All other components within normal limits    Imaging Review Ct Soft Tissue Neck W Contrast  02/01/2014   CLINICAL DATA:  Difficulty swallowing.  History of thyroid cancer.  EXAM: CT NECK WITH CONTRAST  TECHNIQUE: Multidetector CT imaging of the neck was performed using the standard protocol following the bolus administration of intravenous contrast.  CONTRAST:  67mL OMNIPAQUE IOHEXOL 300 MG/ML  SOLN  COMPARISON:  Neck CT 09/24/2013  FINDINGS: Interval thyroidectomy and presumably node dissection. There is a tortuous surgical drain through the anterior neck. Extensive low-density fluid tracks throughout the neck, centered on the visceral compartment. There is new thickening of the aryepiglottic folds which may be related to closed glottis. No deviation of the airway secondary to discrete hematoma. No subglottic narrowing. No findings to suggest mature abscess.  The salivary glands are unremarkable.  No acute osseous findings.  Emphysema in apical lungs.  IMPRESSION: 1. Nonspecific neck edema after thyroidectomy. No  mass effect on airway by hematoma or abscess. 2. New thickening of the aryepiglottic folds could be from closed glottis or edema.   Electronically Signed   By: Jorje Guild M.D.   On: 02/01/2014 01:40     EKG Interpretation None      MDM   Final diagnoses:  Neck swelling    Patient presents with neck swelling following surgery 4 days ago. She is nontoxic on exam. ABCs are intact. No evidence of stridor or impending airway compromise. Her surgical incisions are clean dry and intact.  Serosanguineous drainage and drained. CT scan obtained to evaluate for source of swelling. Nonspecific neck edema noted with aryepiglottic fold edema versus close glottis. Patient was monitored in the ER for 3-4 hours without worsening of symptoms. She was given 2 mg of Decadron. Discussed patient with on-call physician for Dr. Vevelyn Royals, Dr. Carolan Clines.  Patient has an appointment later on today in the office. She was also instructed to call first thing in the morning to be seen or immediately. Dr. Carolan Clines agrees with plan.  After history, exam, and medical workup I feel the patient has been appropriately medically screened and is safe for discharge home. Pertinent diagnoses were discussed with the patient. Patient was given return precautions.     Merryl Hacker, MD 02/01/14 [phone]

## 2014-02-08 ENCOUNTER — Other Ambulatory Visit (HOSPITAL_COMMUNITY): Payer: BC Managed Care – PPO

## 2014-03-04 ENCOUNTER — Encounter (HOSPITAL_COMMUNITY): Payer: Medicare Other | Attending: Oncology

## 2014-03-04 ENCOUNTER — Encounter (HOSPITAL_COMMUNITY): Payer: Self-pay

## 2014-03-04 ENCOUNTER — Ambulatory Visit (HOSPITAL_COMMUNITY): Payer: Medicare Other

## 2014-03-04 VITALS — BP 139/77 | HR 70 | Temp 97.7°F | Resp 18 | Wt 183.8 lb

## 2014-03-04 DIAGNOSIS — C7931 Secondary malignant neoplasm of brain: Secondary | ICD-10-CM | POA: Diagnosis present

## 2014-03-04 DIAGNOSIS — Z86718 Personal history of other venous thrombosis and embolism: Secondary | ICD-10-CM

## 2014-03-04 DIAGNOSIS — C73 Malignant neoplasm of thyroid gland: Secondary | ICD-10-CM | POA: Insufficient documentation

## 2014-03-04 DIAGNOSIS — C801 Malignant (primary) neoplasm, unspecified: Secondary | ICD-10-CM

## 2014-03-04 LAB — CBC WITH DIFFERENTIAL/PLATELET
BASOS ABS: 0.1 10*3/uL (ref 0.0–0.1)
BASOS PCT: 1 % (ref 0–1)
EOS ABS: 0.2 10*3/uL (ref 0.0–0.7)
EOS PCT: 4 % (ref 0–5)
HCT: 40.5 % (ref 36.0–46.0)
Hemoglobin: 13.7 g/dL (ref 12.0–15.0)
Lymphocytes Relative: 27 % (ref 12–46)
Lymphs Abs: 1.6 10*3/uL (ref 0.7–4.0)
MCH: 30 pg (ref 26.0–34.0)
MCHC: 33.8 g/dL (ref 30.0–36.0)
MCV: 88.8 fL (ref 78.0–100.0)
Monocytes Absolute: 0.7 10*3/uL (ref 0.1–1.0)
Monocytes Relative: 12 % (ref 3–12)
NEUTROS PCT: 56 % (ref 43–77)
Neutro Abs: 3.2 10*3/uL (ref 1.7–7.7)
PLATELETS: 286 10*3/uL (ref 150–400)
RBC: 4.56 MIL/uL (ref 3.87–5.11)
RDW: 12.8 % (ref 11.5–15.5)
WBC: 5.8 10*3/uL (ref 4.0–10.5)

## 2014-03-04 MED ORDER — HYDROMORPHONE HCL 4 MG PO TABS: ORAL_TABLET | ORAL | Status: DC

## 2014-03-04 MED ORDER — FENTANYL 50 MCG/HR TD PT72: MEDICATED_PATCH | TRANSDERMAL | Status: DC

## 2014-03-04 NOTE — Patient Instructions (Signed)
[facility] Discharge Instructions  RECOMMENDATIONS MADE BY THE CONSULTANT AND ANY TEST RESULTS WILL BE SENT TO YOUR REFERRING PHYSICIAN.  We will see you after your chest CT on December 17th.  CT as scheduled on December 16th.  Please call for any questions or concerns.   Thank you for choosing [facility] to provide your oncology and hematology care.  To afford each patient quality time with our providers, please arrive at least 15 minutes before your scheduled appointment time.  With your help, our goal is to use those 15 minutes to complete the necessary work-up to ensure our physicians have the information they need to help with your evaluation and healthcare recommendations.    Effective January 1st, 2014, we ask that you re-schedule your appointment with our physicians should you arrive 10 or more minutes late for your appointment.  We strive to give you quality time with our providers, and arriving late affects you and other patients whose appointments are after yours.    Again, thank you for choosing [facility].  Our hope is that these requests will decrease the amount of time that you wait before being seen by our physicians.       _____________________________________________________________  Should you have questions after your visit to [facility], please contact our office at [phone] between the hours of 8:30 a.m. and 4:30 p.m.  Voicemails left after 4:30 p.m. will not be returned until the following business day.  For prescription refill requests, have your pharmacy contact our office with your prescription refill request.    _______________________________________________________________  We hope that we have given you very good care.  You may receive a patient satisfaction survey in the mail, please complete it and return it as soon as possible.  We value your  feedback!  _______________________________________________________________  Have you asked about our STAR program?  STAR stands for Survivorship Training and Rehabilitation, and this is a nationally recognized cancer care program that focuses on survivorship and rehabilitation.  Cancer and cancer treatments may cause problems, such as, pain, making you feel tired and keeping you from doing the things that you need or want to do. Cancer rehabilitation can help. Our goal is to reduce these troubling effects and help you have the best quality of life possible.  You may receive a survey from a nurse that asks questions about your current state of health.  Based on the survey results, all eligible patients will be referred to the [facility] program for an evaluation so we can better serve you!  A frequently asked questions sheet is available upon request.

## 2014-03-04 NOTE — Progress Notes (Signed)
Junita S Buenaventura's reason for visit today is for labs as scheduled per MD orders.  Venipuncture performed with a 23 gauge butterfly needle to R Antecubital.  Ellis Parents S Stanis tolerated procedure well and without incident; questions were answered and patient was discharged.

## 2014-03-04 NOTE — Progress Notes (Signed)
Haley Lowe  OFFICE PROGRESS NOTE  [name], MD [place] [address]  DIAGNOSIS: Primary papillary thyroid carcinoma - Plan: TSH, Thyroglobulin, Thyroglobulin antibody, Thyroglobulin, Thyroglobulin antibody, CT Chest W Contrast  Metastatic adenocarcinoma to brain, unknown primary - Plan: CBC with Differential  Chief Complaint  Patient presents with  . Thyroid Cancer  . Metastatic adenocarcinoma to the brain  . Mediastinal and hilar adenopathy    CURRENT THERAPY: Bilateral neck lymph node dissections on 01/28/2014 by Dr. Vevelyn Royals.   INTERVAL HISTORY: Haley Lowe 58 y.o. female returns for follow-up of metastatic adenocarcinoma to the brain, status post resection, radiotherapy, and 6 cycles of carboplatin/Taxol from 11/12/2011 through 02/25/2012 after which maintenance Alimta was offered which the patient declined. along with locally advanced papillary thyroid carcinoma, status post bilateral neck lymph node dissections in October 2015 in the setting of hypermetabolic foci in the left suprahilar and infrahilar region of the lung favoring nodal metastases without any evidence of malignant subdiaphragmatic disease. Hepatic steatosis was also noted. The patient is suffering with severe pain and is taking Dilantin at 4 mg every 3 hours around-the-clock and oral to be comfortable. The pain is primarily on both sides of her neck with occasional swelling. She denies any headache, seizure activity, but has had lower extremity swelling without significant peripheral paresthesias. She denies any cough, wheezing, sore throat, but does have dysphagia and needs to chew her food completely. She denies any abdominal distention, back pain, fever, night sweats, or headache. She is scheduled for an MRI the brain in January 2016.  MEDICAL HISTORY: Past Medical History  Diagnosis Date  . Hypertension   . PONV (postoperative nausea and  vomiting)   . Anxiety   . Depression   . COPD (chronic obstructive pulmonary disease)   . GERD (gastroesophageal reflux disease)   . Headache(784.0)   . Arthritis   . Adenocarcinoma 09/28/2011  . Hx of adenomatous colonic polyps   . Pulmonary nodules   . Diabetes mellitus   . Emphysema   . 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 Allergy     hips and shoulders  . Cataract     1 lens implaqnt right eye,intact on left eye  . Goiter     nodule watching  . Fibromyalgia     neuropathy tops of both feet  . Metastasis to brain 09/15/11     left frontoparietal region  . History of radiation therapy 10/06/11    1 fraction  15 gray  . Pulmonary emboli 11/04/11    right upper lobe and r lower lobe PE  . Lung nodule seen on imaging study 11/04/11 CT    66mm LLL  . DVT (deep venous thrombosis) 12/09/2011  . Pulmonary embolism 12/09/2011  . Hyperlipidemia   . History of radiation therapy 10/06/11    SRS 15Gy 63fx, brain  . Metastatic adenocarcinoma to brain, unknown primary   . Thyroid cancer 2015    INTERIM HISTORY: has Pulmonary nodules; Metastatic adenocarcinoma to brain, unknown primary; Allergy; Fibromyalgia; COPD with asthma; DVT (deep venous thrombosis); Pulmonary embolism; Occlusion and stenosis of carotid artery without mention of cerebral infarction; and Primary papillary thyroid carcinoma on her problem list.     Metastatic adenocarcinoma to brain, unknown primary   09/15/2011 Initial Diagnosis Metastatic adenocarcinoma to brain, unknown primary.  Resection from frontal brain lesion   09/30/2011 Imaging PET scan shows lung  lesion and liver lesion   10/06/2011 -  Radiation Therapy Radiosurgery to the left fronto-parietal postoperative area: 15 Gy   10/13/2011 Imaging Negative EGD with negative biopsies.   11/12/2011 - 02/25/2012 Chemotherapy Carboplatin/Paclitaxel x 6 cycles.  Patient declined maintenance Alimta x 6 cycles   01/06/2012 Remission PET scan  demonstrates  remission   07/26/2012 Remission PET scan demonstrates continued remission   08/06/2013 Remission CT CAP- negative for recurrence   08/17/2013 Imaging MRI brain- Stable appearance status post surgical resection of of left posterior frontal metastasis with subsequent XRT. No recurrent tumor.    12/28/2013 Imaging MRI brain- Satisfactory and stable since 2014 post treatment appearance of the brain. No new intracranial abnormality.    Primary papillary thyroid carcinoma   09/27/2013 Initial Diagnosis Diagnosis THYROID, FINE NEEDLE ASPIRATION RIGHT, (SPECIMEN 1 OF 1, COLLECTED ON 09/27/2013): MALIGNANT (BETHESDA CLASS VI). MALIGNANT CELLS CONSISTENT WITH CARCINOMA.   10/29/2013 Surgery Right and left thyroid lobectomy with R level 4 lymph node and 2 rR paratracheal lymph nodes positive for metastatic disease   12/24/2013 Imaging PET- R greater than left hypermetabolic cervical nodes. Given persistence suspicious for metastatic disease. Hypermetabolic foci of L  suprahilar and infrahilar region. Favor nodal metastases.   12/27/2013 Imaging MRI neck- Multiple small but asymmetric right-sided cervical lymph nodes, suspicious for metastatic disease.   12/28/2013 Imaging MRI brain- Satisfactory and stable since 2014 post treatment appearance of the brain. No new intracranial abnormality.    01/28/2014 Surgery Bilateral lymph node dissection by Dr. Vevelyn Royals with multiple positive nodes of papillary thyroid cancer.   ALLERGIES:  is allergic to penicillins; codeine; and tape.  MEDICATIONS: has a current medication list which includes the following prescription(s): alprazolam, aspirin, cetirizine, cholecalciferol, doxycycline, furosemide, hydromorphone, ibuprofen, levothyroxine, metoprolol tartrate, polyethylene glycol, simvastatin, zolpidem, and fentanyl.  SURGICAL HISTORY:  Past Surgical History  Procedure Laterality Date  . Abdominal hysterectomy  1996  . Cholecystectomy  2000  . Shoulder surgery  1998    right  .  Eye surgery  2001    vision correction  . Foot surgery  2005    left  . Craniotomy  09/15/2011    Procedure: CRANIOTOMY TUMOR EXCISION;  Surgeon: Hosie Spangle, MD;  Location: [facility];  Service: Neurosurgery;  Laterality: N/A;  Craniotomy resection of tumor with stealth  . Cataract extraction  2011    with lens implant  . Total thyroidectomy  10-29-2013    [facility]  . Lymph gland excision      FAMILY HISTORY: family history includes Aneurysm in her paternal grandmother; Asthma in her mother; COPD in her brother and sister; Colon cancer in her brother; Diabetes in her sister; Heart attack in her sister; Kidney failure in her father; Parkinsonism in her maternal uncle.  SOCIAL HISTORY:  reports that she quit smoking about 2 years ago. Her smoking use included Cigarettes. She has a 40 pack-year smoking history. She has never used smokeless tobacco. She reports that she does not drink alcohol or use illicit drugs.  REVIEW OF SYSTEMS:  Other than that discussed above is noncontributory.  PHYSICAL EXAMINATION: ECOG PERFORMANCE STATUS: 1 - Symptomatic but completely ambulatory  Blood pressure 139/77, pulse 70, temperature 97.7 F (36.5 C), temperature source Oral, resp. rate 18, weight 183 lb 12.8 oz (83.371 kg), SpO2 99 %.  GENERAL:alert, in distress from neck pain 9/10. SKIN: skin color, texture, turgor are normal, no rashes or significant lesions EYES: PERLA; Conjunctiva are pink and non-injected,  sclera clear SINUSES: No redness or tenderness over maxillary or ethmoid sinuses OROPHARYNX:no exudate, no erythema on lips, buccal mucosa, or tongue. NECK: Significant restriction in motion of the neck without exquisite tenderness. CHEST: Increased AP diameter with no breast masses. LYMPH:  no palpable lymphadenopathy in the cervical, axillary or inguinal LUNGS: clear to auscultation and percussion with normal breathing effort HEART: regular rate & rhythm and  no murmurs. ABDOMEN:abdomen soft, non-tender and normal bowel sounds MUSCULOSKELETAL:no cyanosis of digits and no clubbing. Range of motion normal.  NEURO: alert & oriented x 3 with fluent speech, no focal motor/sensory deficits   LABORATORY DATA: Office Visit on 03/04/2014  Component Date Value Ref Range Status  . WBC 03/04/2014 5.8  4.0 - 10.5 K/uL Final  . RBC 03/04/2014 4.56  3.87 - 5.11 MIL/uL Final  . Hemoglobin 03/04/2014 13.7  12.0 - 15.0 g/dL Final  . HCT 03/04/2014 40.5  36.0 - 46.0 % Final  . MCV 03/04/2014 88.8  78.0 - 100.0 fL Final  . MCH 03/04/2014 30.0  26.0 - 34.0 pg Final  . MCHC 03/04/2014 33.8  30.0 - 36.0 g/dL Final  . RDW 03/04/2014 12.8  11.5 - 15.5 % Final  . Platelets 03/04/2014 286  150 - 400 K/uL Final  . Neutrophils Relative % 03/04/2014 56  43 - 77 % Final  . Neutro Abs 03/04/2014 3.2  1.7 - 7.7 K/uL Final  . Lymphocytes Relative 03/04/2014 27  12 - 46 % Final  . Lymphs Abs 03/04/2014 1.6  0.7 - 4.0 K/uL Final  . Monocytes Relative 03/04/2014 12  3 - 12 % Final  . Monocytes Absolute 03/04/2014 0.7  0.1 - 1.0 K/uL Final  . Eosinophils Relative 03/04/2014 4  0 - 5 % Final  . Eosinophils Absolute 03/04/2014 0.2  0.0 - 0.7 K/uL Final  . Basophils Relative 03/04/2014 1  0 - 1 % Final  . Basophils Absolute 03/04/2014 0.1  0.0 - 0.1 K/uL Final    PATHOLOGY:  Surgical Pathology Tissue Request10/26/2015  [facility]  Result Narrative  [phone]   DIAGNOSIS   1. LYMPH NODES, LEFT LEVEL 2, RESECTION:  TEN OF TEN RECOVERED LYMPH NODES NEGATIVE FOR METASTATIC CARCINOMA.   2. LYMPH NODES, LEFT LEVELS 3-5, RESECTION:  METASTATIC PAPILLARY THYROID CARCINOMA, INVOLVING TWELVE OF EIGHTEEN  RECOVERED LYMPH NODES.  THE LARGEST TUMOR DEPOSIT IS ABOUT 1.5 CM ACROSS.   3. LYMPH NODES, RIGHT LEVELS 2-5, RESECTION:  METASTATIC PAPILLARY THYROID CARCINOMA, INVOLVING TWENTY-TWO OF  THIRTY-THREE RECOVERED  LYMPH NODES.  THE LARGEST TUMOR DEPOSIT IS SLIGHTLY  LESS THAN 1.5 CM.  (MNS)   COMMENT  THIS PAPILLARY CARCINOMA IS RELATIVELY HIGH GRADE AND CONTAINS AREAS OF  NECROSIS.  THERE IS PROMINENT INVOLVEMENT OF PERINODAL VASCULAR SPACES  BY PAPILLARY THYROID CARCINOMA.     Glenda Chroman MD  PATHOLOGIST  (CASE SIGNED 01/29/2014)   CLINICAL INFORMATION   1. METASTATIC THYROID CARCINOMA.   SPECIMEN  1. LYMPH NODE(S), LEVEL 2-RESECTION  2. LYMPH NODE(S), LEFT LEVEL 3, 4 & 5-RESECTION  3. LYMPH NODE(S), RIGHT LEVELS 2, 3, 4 & 5-RESECTION   GROSS DESCRIPTION  1. RECEIVED IS A BILOBED 7.5 X 2.8 X 1.2 CM PERIPHERALLY INDURATED  YELLOW-TAN TISSUE CONTAINING SEVERAL APPARENT PINK LYMPH NODES, UP TO 6  MM, ALONG WITH OTHER SLIGHTLY NODULAR INDURATED AREAS.  MULTIPLE PIECES  ARE SUBMITTED IN FIVE CASSETTES.   2. RECEIVED IS AN 11.5 X 4.5 X UP TO 2.2 CM PLATE OF ADIPOSE TISSUE.  WITHIN THE TISSUE ARE MULTIPLE LYMPH NODES, UP TO 1.8 CM IN GREATEST  DIMENSION.  SEVERAL ARE GROSSLY VERY SUSPICIOUS FOR CONTAINING  METASTATIC CARCINOMA.   SECTIONS ARE SUBMITTED AS FOLLOWS:  2A, TRISECTED NODE.  2B-D, MULTIPLE  PUTATIVE NODES.  2E, ONE BISECTED NODE.  41F, ONE TRISECTED NODE.  2G,  ONE BISECTED NODE.   3. RECEIVED IS A 12 X 5.9 X UP TO 3 CM PORTION OF ADIPOSE TISSUE.   WITHIN THE FAT ARE NUMEROUS LYMPH NODES AND LYMPH NODE CANDIDATES, UP TO  1.4 CM IN GREATEST DIMENSION.  MANY APPEAR GROSSLY POSITIVE.  SECTIONS  ARE SUBMITTED AS FOLLOWS:  3A, ONE BISECTED NODE.  3B, MULTIPLE NODES  AND NODE CANDIDATES.  3C, ONE BISECTED LYMPH NODE.  3D, MULTIPLE NODES  AND NODE CANDIDATES.  3E, MULTIPLE NODES AND NODE CANDIDATES.  26F, ONE  BISECTED LYMPH NODE.  3G, ONE BISECTED LYMPH NODE.  3H, MULTIPLE LYMPH  NODES AND LYMPH NODE CANDIDATES.  3I, ONE BISECTED LYMPH NODE.  3J,  MULTIPLE LYMPH NODES AND LYMPH NODE CANDIDATES.   (GWE/RB1)   THE ABOVE DIAGNOSIS IS BASED ON PERFORMANCE OF THOROUGH GROSS AND/OR  MICROSCOPIC EVALUATIONS.  IMMUNOPEROXIDASE  PROCEDURES WERE DEVELOPED AND  PERFORMANCE CHARACTERISTICS DETERMINED BY PATHOLOGISTS DIAGNOSTIC  LABORATORY.  NOT ALL HAVE BEEN CLEARED OR APPROVED BY THE [place] FOOD AND  DRUG ADMINISTRATION.    Performed by: [facility]       Urinalysis    Component Value Date/Time   COLORURINE YELLOW 04/18/2012 [place] 04/18/2012 1134   LABSPEC <1.005* 04/18/2012 1134   PHURINE 5.5 04/18/2012 [place] 04/18/2012 1134   HGBUR TRACE* 04/18/2012 1134   [place] 04/18/2012 [place] 04/18/2012 [place] 04/18/2012 1134   UROBILINOGEN 0.2 04/18/2012 1134   NITRITE NEGATIVE 04/18/2012 1134   LEUKOCYTESUR NEGATIVE 04/18/2012 1134    RADIOGRAPHIC STUDIES: No results found. Skull Base To Thigh   Status: Final result       PACS Images     Show images for NM PET Image Restag (PS) Skull Base To Thigh     Study Result     CLINICAL DATA: Subsequent treatment strategy for restaging of metastatic adenocarcinoma. Lung primary. Metastasis to brain. Weight loss. Diabetes. Emphysema.  EXAM: NUCLEAR MEDICINE PET SKULL BASE TO THIGH  TECHNIQUE: 8.9 mCi F-18 FDG was injected intravenously. Full-ring PET imaging was performed from the skull base to thigh after the radiotracer. CT data was obtained and used for attenuation correction and anatomic localization.  FASTING BLOOD GLUCOSE: Value: 131 mg/dl  COMPARISON: 07/26/2012. Neck CT of 09/24/2013. 08/17/2013 brain MR. 08/06/2013 chest, abdomen, and pelvic CTs.  FINDINGS: NECK  Right greater than left hypermetabolic cervical adenopathy. Right level 2 node measures 1.2 cm and a S.U.V. max of 7.0 on image 41.  A right supraclavicular node measures 8 mm and a S.U.V. max of 5.1 on image 46. This node is new since the prior PET.  A low left jugular node measures 1.0 cm and a S.U.V. max of  4.5 on image 46.  CHEST  Hypermetabolism about the left suprahilar region. Suspicion of developing soft tissue fullness in this area. This measures a S.U.V. max of 6.1 on image 76.  Left infrahilar hypermetabolism. Possible adenopathy developing in this area. Example at 1.4 cm and a S.U.V. max of 5.4 on image 81.  ABDOMEN/PELVIS  No areas of abnormal hypermetabolism.  SKELETON  No abnormal marrow activity.  CT IMAGES PERFORMED FOR ATTENUATION CORRECTION  Carotid atherosclerosis. Centrilobular emphysema. Non aneurysmal aortic dilatation. 2.6 cm maximally in the infrarenal segment. Age advanced aortic and branch vessel atherosclerosis. Mild to moderate hepatic steatosis with more focal steatosis adjacent to the falciform. Cholecystectomy. Hysterectomy. Pelvic floor laxity.  IMPRESSION: 1. Right greater than left hypermetabolic cervical nodes. Given persistence since the prior neck CT, suspicious for metastatic disease. 2. Hypermetabolic foci about the left suprahilar and infrahilar region. Suspect developing soft tissue fullness/adenopathy in these areas. Favor nodal metastasis. 3. No evidence of subdiaphragmatic disease. 4. Incidental findings, including atherosclerosis, hepatic steatosis, and pelvic floor laxity.   Electronically Signed  By: Abigail Miyamoto M.D.  On: 12/24/2013     ASSESSMENT:  1. Metastatic papillary thyroid cancer, PET scan also demonstrates left infrahilar and suprahilar hypermetabolic activity. This will need to be addressed in future, status post bilateral lymph node dissection of the neck with multiple nodes involved, see past reported above. 2. 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 S/P surgery followed by radiation therapy as well as chemotherapy consisting of carboplatin/paclitaxel x6 cycles (11/12/11- 02/25/2012). She was offered maintenance Alimta which she has declined. Presently she is been observed  and presently has no evidence of obvious disease by MRI scan of the brain or CT scans. 3. DVT left leg with pulmonary embolus, S/P >1 year worth of anticoagulation. Thought to be secondary to malignancy which is now in remission and she stopped Coumadin in July 2014.  4. S/P Port-A-Cath removal on 08/10/2012  5. S/P IVC filter removed on 08/10/2012    PLAN:  #1. Improve pain control by applying a fentanyl 50 patch every 3 days and continuing on nilotinib 4 mg up to every 3 hours as needed for breakthrough pain. Patient was told to call back in 3 days regarding symptomatology. Adjustments will be made in dosage to maximize relief. Patient was encouraged to continue Miralax daily. #2. Repeat thyroglobulin and anti-thyroglobulin antibody studies today. #3. Repeat CT of the chest with contrast on 03/21/2014. #4. Follow-up with MRI the brain in January 2016. #5. Follow-up visit on 03/22/2014.   All questions were answered. The patient knows to call the clinic with any problems, questions or concerns. We can certainly see the patient much sooner if necessary.   I spent 30 minutes counseling the patient face to face. The total time spent in the appointment was 40 minutes.    Doroteo Bradford, MD 03/04/2014 7:44 PM  DISCLAIMER:  This note was dictated with voice recognition software.  Similar sounding words can inadvertently be transcribed inaccurately and may not be corrected upon review.

## 2014-03-05 LAB — THYROGLOBULIN LEVEL: Thyroglobulin: 0.2 ng/mL — ABNORMAL LOW (ref 2.8–40.9)

## 2014-03-05 LAB — THYROGLOBULIN ANTIBODY: Thyroglobulin Ab: <1 [IU]/mL

## 2014-03-07 ENCOUNTER — Telehealth (HOSPITAL_COMMUNITY): Payer: Self-pay | Admitting: Hematology and Oncology

## 2014-03-07 NOTE — Telephone Encounter (Signed)
Spoke with patient told to increase fentanyl patch to 2 of the 50s every 3 days and to call back on 03/11/2014 with analgesic requirement regarding Dilaudid for breakthrough pain

## 2014-03-11 ENCOUNTER — Telehealth (HOSPITAL_COMMUNITY): Payer: Self-pay

## 2014-03-11 NOTE — Telephone Encounter (Signed)
Spoke with [place].  States "the patches are really working well.  Have not had to take anything for break-through pain but I'm very nauseated and having a lot of reflux."  Wants to know what she can do.

## 2014-03-11 NOTE — Telephone Encounter (Signed)
Patient notified, verbalized understanding of instructions and will call back with update at the end of the week.

## 2014-03-11 NOTE — Telephone Encounter (Signed)
-----   Message from Farrel Gobble, MD sent at 03/11/2014  1:05 PM EST ----- Stop ibuprofen. Zantac (ranitidine) 150 mg twice a day for reflux and if not controlled, Mylanta 2 teaspoonfuls up to every 4 hours in between. Glad that pain is better.

## 2014-03-11 NOTE — Telephone Encounter (Signed)
-----   Message from Louis Meckel sent at 03/11/2014 11:20 AM EST ----- Regarding: Pain med Patient called and stated that the pain patches are making her have acid reflex and nausea.  Can you call her?  Thanks

## 2014-03-18 ENCOUNTER — Other Ambulatory Visit (HOSPITAL_COMMUNITY): Payer: Self-pay | Admitting: Hematology and Oncology

## 2014-03-18 ENCOUNTER — Telehealth (HOSPITAL_COMMUNITY): Payer: Self-pay | Admitting: Hematology and Oncology

## 2014-03-18 MED ORDER — FENTANYL 50 MCG/HR TD PT72: MEDICATED_PATCH | TRANSDERMAL | Status: DC

## 2014-03-20 ENCOUNTER — Ambulatory Visit (HOSPITAL_COMMUNITY)
Admission: RE | Admit: 2014-03-20 | Discharge: 2014-03-20 | Disposition: A | Discharge status: Home / Self Care | Payer: Medicare Other | Source: Ambulatory Visit | Attending: Hematology and Oncology | Admitting: Hematology and Oncology

## 2014-03-20 ENCOUNTER — Other Ambulatory Visit (HOSPITAL_COMMUNITY): Payer: Self-pay | Admitting: Hematology and Oncology

## 2014-03-20 DIAGNOSIS — M47894 Other spondylosis, thoracic region: Secondary | ICD-10-CM | POA: Insufficient documentation

## 2014-03-20 DIAGNOSIS — R599 Enlarged lymph nodes, unspecified: Secondary | ICD-10-CM | POA: Insufficient documentation

## 2014-03-20 DIAGNOSIS — Z9049 Acquired absence of other specified parts of digestive tract: Secondary | ICD-10-CM | POA: Insufficient documentation

## 2014-03-20 DIAGNOSIS — R911 Solitary pulmonary nodule: Secondary | ICD-10-CM | POA: Diagnosis not present

## 2014-03-20 DIAGNOSIS — K760 Fatty (change of) liver, not elsewhere classified: Secondary | ICD-10-CM | POA: Insufficient documentation

## 2014-03-20 DIAGNOSIS — I700 Atherosclerosis of aorta: Secondary | ICD-10-CM | POA: Diagnosis not present

## 2014-03-20 DIAGNOSIS — J432 Centrilobular emphysema: Secondary | ICD-10-CM | POA: Diagnosis not present

## 2014-03-20 DIAGNOSIS — C73 Malignant neoplasm of thyroid gland: Secondary | ICD-10-CM | POA: Insufficient documentation

## 2014-03-20 MED ORDER — IOHEXOL 300 MG/ML  SOLN
80.0000 mL | Freq: Once | INTRAMUSCULAR | Status: AC | PRN
Start: 2014-03-20 — End: 2014-03-20
  Administered 2014-03-20: 80 mL via INTRAVENOUS

## 2014-03-20 MED ORDER — FENTANYL 100 MCG/HR TD PT72: 100.0000 ug | MEDICATED_PATCH | TRANSDERMAL | Status: DC

## 2014-03-21 ENCOUNTER — Encounter (HOSPITAL_COMMUNITY): Payer: Self-pay | Admitting: Lab

## 2014-03-21 ENCOUNTER — Encounter (HOSPITAL_COMMUNITY): Payer: Medicare Other | Attending: Oncology

## 2014-03-21 ENCOUNTER — Encounter (HOSPITAL_COMMUNITY): Payer: Self-pay

## 2014-03-21 VITALS — BP 93/52 | HR 61 | Temp 98.6°F | Resp 16 | Wt 176.1 lb

## 2014-03-21 DIAGNOSIS — C73 Malignant neoplasm of thyroid gland: Secondary | ICD-10-CM | POA: Insufficient documentation

## 2014-03-21 DIAGNOSIS — C801 Malignant (primary) neoplasm, unspecified: Secondary | ICD-10-CM

## 2014-03-21 DIAGNOSIS — R591 Generalized enlarged lymph nodes: Secondary | ICD-10-CM

## 2014-03-21 DIAGNOSIS — R599 Enlarged lymph nodes, unspecified: Secondary | ICD-10-CM

## 2014-03-21 DIAGNOSIS — C770 Secondary and unspecified malignant neoplasm of lymph nodes of head, face and neck: Secondary | ICD-10-CM

## 2014-03-21 DIAGNOSIS — R590 Localized enlarged lymph nodes: Secondary | ICD-10-CM

## 2014-03-21 DIAGNOSIS — C7931 Secondary malignant neoplasm of brain: Secondary | ICD-10-CM | POA: Insufficient documentation

## 2014-03-21 DIAGNOSIS — Z86718 Personal history of other venous thrombosis and embolism: Secondary | ICD-10-CM

## 2014-03-21 NOTE — Progress Notes (Signed)
Referral sent to Dr Dorris Fetch. Records faxed on 12/17

## 2014-03-21 NOTE — Patient Instructions (Signed)
[facility] Discharge Instructions  RECOMMENDATIONS MADE BY THE CONSULTANT AND ANY TEST RESULTS WILL BE SENT TO YOUR REFERRING PHYSICIAN.  EXAM FINDINGS BY THE PHYSICIAN TODAY AND SIGNS OR SYMPTOMS TO REPORT TO CLINIC OR PRIMARY PHYSICIAN: Exam and findings as discussed by Dr. Barnet Glasgow.  We will make a referral to Dr. Dorris Fetch for Radioactive ablation and to thoracic surgeon for possible biopsy.  You will be called with your appointment dates and times.    INSTRUCTIONS/FOLLOW-UP: MD visit in 5 weeks.  Thank you for choosing [facility] to provide your oncology and hematology care.  To afford each patient quality time with our providers, please arrive at least 15 minutes before your scheduled appointment time.  With your help, our goal is to use those 15 minutes to complete the necessary work-up to ensure our physicians have the information they need to help with your evaluation and healthcare recommendations.    Effective January 1st, 2014, we ask that you re-schedule your appointment with our physicians should you arrive 10 or more minutes late for your appointment.  We strive to give you quality time with our providers, and arriving late affects you and other patients whose appointments are after yours.    Again, thank you for choosing [facility].  Our hope is that these requests will decrease the amount of time that you wait before being seen by our physicians.       _____________________________________________________________  Should you have questions after your visit to [facility], please contact our office at [phone] between the hours of 8:30 a.m. and 4:30 p.m.  Voicemails left after 4:30 p.m. will not be returned until the following business day.  For prescription refill requests, have your pharmacy contact our office with your prescription refill request.     _______________________________________________________________  We hope that we have given you very good care.  You may receive a patient satisfaction survey in the mail, please complete it and return it as soon as possible.  We value your feedback!  _______________________________________________________________  Have you asked about our STAR program?  STAR stands for Survivorship Training and Rehabilitation, and this is a nationally recognized cancer care program that focuses on survivorship and rehabilitation.  Cancer and cancer treatments may cause problems, such as, pain, making you feel tired and keeping you from doing the things that you need or want to do. Cancer rehabilitation can help. Our goal is to reduce these troubling effects and help you have the best quality of life possible.  You may receive a survey from a nurse that asks questions about your current state of health.  Based on the survey results, all eligible patients will be referred to the [facility] program for an evaluation so we can better serve you!  A frequently asked questions sheet is available upon request.

## 2014-03-21 NOTE — Progress Notes (Signed)
[facility]  OFFICE PROGRESS NOTE  [name], MD [place] [address]  DIAGNOSIS: Primary papillary thyroid carcinoma - Plan: Consult to endocrinology  Metastatic adenocarcinoma to brain, unknown primary - Plan: Consult to thoracic surgery  Adenopathy, hilar - Plan: Consult to thoracic surgery  Mediastinal adenopathy  Adenopathy, retrocrural  Chief Complaint  Patient presents with  . Papillary thyroid cancer  . Metastatic adeno to brain, primary unknown, presumed to be l    Worsening hilar, mediastinal, and retrocrural lymphadenopathy on CT scan 03/20/2014    CURRENT THERAPY: Bilateral neck lymph node dissections on 01/28/2014 by Dr. Vevelyn Royals  INTERVAL HISTORY: Haley Lowe 58 y.o. female returns forfollow-up of metastatic adenocarcinoma to the brain, status post resection, radiotherapy, and 6 cycles of carboplatin/Taxol from 11/12/2011 through 02/25/2012 after which maintenance Alimta was offered which the patient declined. along with locally advanced papillary thyroid carcinoma, status post bilateral neck lymph node dissections in October 2015 in the setting of hypermetabolic foci in the left suprahilar and infrahilar region of the lung favoring nodal metastases without any evidence of malignant subdiaphragmatic disease. Hepatic steatosis was also noted. Repeat CT scan the chest was performed on 03/20/2014 and discussion regarding results will be held today. She is using fentanyl 100 patch every 3 days and no longer needing Dilaudid for breakthrough pain. Appetite is good with no dysphagia, cough, wheezing, PND, orthopnea, palpitations, nausea, vomiting, diarrhea, constipation, headache, focal weakness, or seizure activity.  MEDICAL HISTORY: Past Medical History  Diagnosis Date  . Hypertension   . PONV (postoperative nausea and vomiting)   . Anxiety   . Depression   . COPD (chronic obstructive pulmonary  disease)   . GERD (gastroesophageal reflux disease)   . Headache(784.0)   . Arthritis   . Adenocarcinoma 09/28/2011  . Hx of adenomatous colonic polyps   . Pulmonary nodules   . Diabetes mellitus   . Emphysema   . 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 Allergy     hips and shoulders  . Cataract     1 lens implaqnt right eye,intact on left eye  . Goiter     nodule watching  . Fibromyalgia     neuropathy tops of both feet  . Metastasis to brain 09/15/11     left frontoparietal region  . History of radiation therapy 10/06/11    1 fraction  15 gray  . Pulmonary emboli 11/04/11    right upper lobe and r lower lobe PE  . Lung nodule seen on imaging study 11/04/11 CT    49mm LLL  . DVT (deep venous thrombosis) 12/09/2011  . Pulmonary embolism 12/09/2011  . Hyperlipidemia   . History of radiation therapy 10/06/11    SRS 15Gy 70fx, brain  . Metastatic adenocarcinoma to brain, unknown primary   . Thyroid cancer 2015  . UTI (lower urinary tract infection)     INTERIM HISTORY: has Pulmonary nodules; Metastatic adenocarcinoma to brain, unknown primary; Allergy; Fibromyalgia; COPD with asthma; DVT (deep venous thrombosis); Pulmonary embolism; Occlusion and stenosis of carotid artery without mention of cerebral infarction; and Primary papillary thyroid carcinoma on her problem list.     Metastatic adenocarcinoma to brain, unknown primary   09/15/2011 Initial Diagnosis Metastatic adenocarcinoma to brain, unknown primary.  Resection from frontal brain lesion   09/30/2011 Imaging PET scan shows lung lesion and liver lesion   10/06/2011 -  Radiation Therapy  Radiosurgery to the left fronto-parietal postoperative area: 15 Gy   10/13/2011 Imaging Negative EGD with negative biopsies.   11/12/2011 - 02/25/2012 Chemotherapy Carboplatin/Paclitaxel x 6 cycles.  Patient declined maintenance Alimta x 6 cycles   01/06/2012 Remission PET scan  demonstrates remission   07/26/2012 Remission PET  scan demonstrates continued remission   08/06/2013 Remission CT CAP- negative for recurrence   08/17/2013 Imaging MRI brain- Stable appearance status post surgical resection of of left posterior frontal metastasis with subsequent XRT. No recurrent tumor.    12/28/2013 Imaging MRI brain- Satisfactory and stable since 2014 post treatment appearance of the brain. No new intracranial abnormality.   03/20/2014 Imaging Worsening hilar, mediastinal and development of retrocrural lymphadenopathy. Her globulin 0.2 with negative anti-thyroglobulin antibody. Suspect lung primary with mediastinal, retrocrural, and previously diagnosed brain metastasis.    Primary papillary thyroid carcinoma   09/27/2013 Initial Diagnosis Diagnosis THYROID, FINE NEEDLE ASPIRATION RIGHT, (SPECIMEN 1 OF 1, COLLECTED ON 09/27/2013): MALIGNANT (BETHESDA CLASS VI). MALIGNANT CELLS CONSISTENT WITH CARCINOMA.   10/29/2013 Surgery Right and left thyroid lobectomy with R level 4 lymph node and 2 rR paratracheal lymph nodes positive for metastatic disease   12/24/2013 Imaging PET- R greater than left hypermetabolic cervical nodes. Given persistence suspicious for metastatic disease. Hypermetabolic foci of L  suprahilar and infrahilar region. Favor nodal metastases.   12/27/2013 Imaging MRI neck- Multiple small but asymmetric right-sided cervical lymph nodes, suspicious for metastatic disease.   12/28/2013 Imaging MRI brain- Satisfactory and stable since 2014 post treatment appearance of the brain. No new intracranial abnormality.    01/28/2014 Surgery Bilateral lymph node dissection by Dr. Vevelyn Royals with multiple positive nodes of papillary thyroid cancer.     ALLERGIES:  is allergic to penicillins; codeine; and tape.  MEDICATIONS: has a current medication list which includes the following prescription(s): alprazolam, aspirin, cetirizine, cholecalciferol, fentanyl, levothyroxine, metoprolol tartrate, olmesartan-hydrochlorothiazide, polyethylene  glycol, ranitidine, simvastatin, sulfamethoxazole-trimethoprim, zolpidem, furosemide, hydromorphone, and ibuprofen.  SURGICAL HISTORY:  Past Surgical History  Procedure Laterality Date  . Abdominal hysterectomy  1996  . Cholecystectomy  2000  . Shoulder surgery  1998    right  . Eye surgery  2001    vision correction  . Foot surgery  2005    left  . Craniotomy  09/15/2011    Procedure: CRANIOTOMY TUMOR EXCISION;  Surgeon: Hosie Spangle, MD;  Location: [facility];  Service: Neurosurgery;  Laterality: N/A;  Craniotomy resection of tumor with stealth  . Cataract extraction  2011    with lens implant  . Total thyroidectomy  10-29-2013    [facility]  . Lymph gland excision      FAMILY HISTORY: family history includes Aneurysm in her paternal grandmother; Asthma in her mother; COPD in her brother and sister; Colon cancer in her brother; Diabetes in her sister; Heart attack in her sister; Kidney failure in her father; Parkinsonism in her maternal uncle.  SOCIAL HISTORY:  reports that she quit smoking about 2 years ago. Her smoking use included Cigarettes. She has a 40 pack-year smoking history. She has never used smokeless tobacco. She reports that she does not drink alcohol or use illicit drugs.  REVIEW OF SYSTEMS:  Other than that discussed above is noncontributory.  PHYSICAL EXAMINATION: ECOG PERFORMANCE STATUS: 1 - Symptomatic but completely ambulatory  Blood pressure 93/52, pulse 61, temperature 98.6 F (37 C), temperature source Oral, resp. rate 16, weight 176 lb 1.6 oz (79.878 kg), SpO2 94 %.  GENERAL:alert, no distress  and comfortable. Mildly obese. SKIN: skin color, texture, turgor are normal, no rashes or significant lesions EYES: PERLA; Conjunctiva are pink and non-injected, sclera clear SINUSES: No redness or tenderness over maxillary or ethmoid sinuses OROPHARYNX:no exudate, no erythema on lips, buccal mucosa, or tongue. NECK: supple, ,  non-tender, without nodularity. No masses. No palpable lymphadenopathy in the neck, status post thyroidectomy and bilateral neck dissections. CHEST: Increased AP diameter with no breast masses. LYMPH:  no palpable lymphadenopathy in the cervical, axillary or inguinal LUNGS: clear to auscultation and percussion with normal breathing effort HEART: regular rate & rhythm and no murmurs. ABDOMEN:abdomen soft, non-tender and normal bowel sounds MUSCULOSKELETAL:no cyanosis of digits and no clubbing. Range of motion normal.  NEURO: alert & oriented x 3 with fluent speech, no focal motor/sensory deficits   LABORATORY DATA: Office Visit on 03/04/2014  Component Date Value Ref Range Status  . Thyroglobulin 03/04/2014 0.2* 2.8 - 40.9 ng/mL Final   Comment: (NOTE) Thyroglobulin antibodies (TGAb) interfere with Thyroglobulin (TG) assays; therefore, Thyroglobulin antibody (TGAb) assay should always be performed in conjunction with a Thyroglobulin (TG) assay. This test was performed using the Beckman Coulter chemiluminescent method.  Values obtained from different assay methods cannot be used interchangeably.  Thyroglobulin levels, regardless of value, should not be interpreted as absolute evidence of the presence or absence of disease. **Please note change in methodology. If re-baselining is needed, for patients who are being serially tested, please call customer service, within 3 days of collection, to request to add on the appropriate re-baselining test code for the previous methodology, at no charge.** Performed at [facility]   . Thyroglobulin Ab 03/04/2014 <1  <2 IU/mL Final   Comment: (NOTE) **Please note change in methodology. If re-baselining is needed, for patients who are being serially tested, please call customer service, within 3 days of collection, to request to add on the appropriate re-baselining test code for the previous methodology, at no charge.** Performed at [facility]   . WBC 03/04/2014 5.8  4.0 - 10.5 K/uL Final  . RBC 03/04/2014 4.56  3.87 - 5.11 MIL/uL Final  . Hemoglobin 03/04/2014 13.7  12.0 - 15.0 g/dL Final  . HCT 03/04/2014 40.5  36.0 - 46.0 % Final  . MCV 03/04/2014 88.8  78.0 - 100.0 fL Final  . MCH 03/04/2014 30.0  26.0 - 34.0 pg Final  . MCHC 03/04/2014 33.8  30.0 - 36.0 g/dL Final  . RDW 03/04/2014 12.8  11.5 - 15.5 % Final  . Platelets 03/04/2014 286  150 - 400 K/uL Final  . Neutrophils Relative % 03/04/2014 56  43 - 77 % Final  . Neutro Abs 03/04/2014 3.2  1.7 - 7.7 K/uL Final  . Lymphocytes Relative 03/04/2014 27  12 - 46 % Final  . Lymphs Abs 03/04/2014 1.6  0.7 - 4.0 K/uL Final  . Monocytes Relative 03/04/2014 12  3 - 12 % Final  . Monocytes Absolute 03/04/2014 0.7  0.1 - 1.0 K/uL Final  . Eosinophils Relative 03/04/2014 4  0 - 5 % Final  . Eosinophils Absolute 03/04/2014 0.2  0.0 - 0.7 K/uL Final  . Basophils Relative 03/04/2014 1  0 - 1 % Final  . Basophils Absolute 03/04/2014 0.1  0.0 - 0.1 K/uL Final    PATHOLOGY:  Surgical Pathology Tissue Request10/26/2015  [facility]  Result Narrative  [phone]   DIAGNOSIS   1. LYMPH NODES, LEFT LEVEL 2, RESECTION:  TEN OF TEN RECOVERED LYMPH NODES NEGATIVE FOR METASTATIC  CARCINOMA.   2. LYMPH NODES, LEFT LEVELS 3-5, RESECTION:  METASTATIC PAPILLARY THYROID CARCINOMA, INVOLVING TWELVE OF EIGHTEEN  RECOVERED LYMPH NODES.  THE LARGEST TUMOR DEPOSIT IS ABOUT 1.5 CM ACROSS.   3. LYMPH NODES, RIGHT LEVELS 2-5, RESECTION:  METASTATIC PAPILLARY THYROID CARCINOMA, INVOLVING TWENTY-TWO OF  THIRTY-THREE RECOVERED  LYMPH NODES.  THE LARGEST TUMOR DEPOSIT IS SLIGHTLY LESS THAN 1.5 CM.  (MNS)   COMMENT  THIS PAPILLARY CARCINOMA IS RELATIVELY HIGH GRADE AND CONTAINS AREAS OF  NECROSIS. THERE IS PROMINENT INVOLVEMENT OF PERINODAL VASCULAR SPACES  BY PAPILLARY THYROID CARCINOMA.     Glenda Chroman MD  PATHOLOGIST  (CASE SIGNED 01/29/2014)   CLINICAL  INFORMATION   1. METASTATIC THYROID CARCINOMA.   SPECIMEN  1. LYMPH NODE(S), LEVEL 2-RESECTION  2. LYMPH NODE(S), LEFT LEVEL 3, 4 & 5-RESECTION  3. LYMPH NODE(S), RIGHT LEVELS 2, 3, 4 & 5-RESECTION   GROSS DESCRIPTION  1. RECEIVED IS A BILOBED 7.5 X 2.8 X 1.2 CM PERIPHERALLY INDURATED  YELLOW-TAN TISSUE CONTAINING SEVERAL APPARENT PINK LYMPH NODES, UP TO 6  MM, ALONG WITH OTHER SLIGHTLY NODULAR INDURATED AREAS. MULTIPLE PIECES  ARE SUBMITTED IN FIVE CASSETTES.  2. RECEIVED IS AN 11.5 X 4.5 X UP TO 2.2 CM PLATE OF ADIPOSE TISSUE.  WITHIN THE TISSUE ARE MULTIPLE LYMPH NODES, UP TO 1.8 CM IN GREATEST  DIMENSION. SEVERAL ARE GROSSLY VERY SUSPICIOUS FOR CONTAINING  METASTATIC CARCINOMA.   SECTIONS ARE SUBMITTED AS FOLLOWS: 2A, TRISECTED NODE. 2B-D, MULTIPLE  PUTATIVE NODES. 2E, ONE BISECTED NODE. 82F, ONE TRISECTED NODE. 2G,  ONE BISECTED NODE.  3. RECEIVED IS A 12 X 5.9 X UP TO 3 CM PORTION OF ADIPOSE TISSUE.  WITHIN THE FAT ARE NUMEROUS LYMPH NODES AND LYMPH NODE CANDIDATES, UP TO  1.4 CM IN GREATEST DIMENSION. MANY APPEAR GROSSLY POSITIVE. SECTIONS  ARE SUBMITTED AS FOLLOWS: 3A, ONE BISECTED NODE. 3B, MULTIPLE NODES  AND NODE CANDIDATES. 3C, ONE BISECTED LYMPH NODE. 3D, MULTIPLE NODES  AND NODE CANDIDATES. 3E, MULTIPLE NODES AND NODE CANDIDATES. 49F, ONE  BISECTED LYMPH NODE. 3G, ONE BISECTED LYMPH NODE. 3H, MULTIPLE LYMPH  NODES AND LYMPH NODE CANDIDATES. 3I, ONE BISECTED LYMPH NODE. 3J,  MULTIPLE LYMPH NODES AND LYMPH NODE CANDIDATES.  (GWE/RB1)   THE ABOVE DIAGNOSIS IS BASED ON PERFORMANCE OF THOROUGH GROSS AND/OR  MICROSCOPIC EVALUATIONS. IMMUNOPEROXIDASE PROCEDURES WERE DEVELOPED AND  PERFORMANCE CHARACTERISTICS DETERMINED BY PATHOLOGISTS DIAGNOSTIC  LABORATORY. NOT ALL HAVE BEEN CLEARED OR APPROVED BY THE [place] FOOD AND  DRUG ADMINISTRATION.    Performed by: [facility]     Urinalysis    Component Value Date/Time   COLORURINE YELLOW 04/18/2012 [place] 04/18/2012 1134   LABSPEC <1.005* 04/18/2012 1134   PHURINE 5.5 04/18/2012 [place] 04/18/2012 1134   HGBUR TRACE* 04/18/2012 1134   Advance 04/18/2012 [place] 04/18/2012 [place] 04/18/2012 1134   UROBILINOGEN 0.2 04/18/2012 1134   NITRITE NEGATIVE 04/18/2012 1134   LEUKOCYTESUR NEGATIVE 04/18/2012 1134    RADIOGRAPHIC STUDIES: Ct Chest W Contrast  03/21/2014   ADDENDUM REPORT: 03/21/2014 14:11  ADDENDUM: Clarification is made to the body of the report of the chest CT of 03/20/2014.  Sentence in the first paragraph of the body of the report should be corrected to state:  Progressive LEFT hilar adenopathy, with index LEFT hilar lymph node 12 mm short axis series 2, image 31, previously 3 mm (not 3.2  cm as previously reported).  Discussed with Kirby Crigler PA on 03/21/2014 at 1405 hours.   Electronically Signed   By: Lavonia Dana M.D.   On: 03/21/2014 14:11   03/21/2014   CLINICAL DATA:  Primary papillary thyroid cancer. Followup hilar adenopathy.  EXAM: CT CHEST WITH CONTRAST  TECHNIQUE: Multidetector CT imaging of the chest was performed during intravenous contrast administration.  CONTRAST:  25mL OMNIPAQUE IOHEXOL 300 MG/ML  SOLN  COMPARISON:  08/06/2013 and PET-CT from 12/24/2013.  FINDINGS: Mediastinum: The heart size appears normal. No pericardial effusion. Calcified atherosclerotic disease involves the thoracic aorta. Progressive left hilar adenopathy. Index lymph node on the left measures 1.2 cm, image 31/series 2. Previously 3.2 cm. Left hilar lymph node measures 1 cm, image 35/series 2. New from previous exam.  Lungs/Pleura: No pleural effusion. Moderate, upper lobe predominant changes of centrilobular emphysema identified. There is a nodule in the left lower lobe which measures 3 mm, image 49/series 3. This is unchanged  from the previous exam. Stable right upper lobe nodule measuring 3 mm, image 24/series 3.  Upper Abdomen: New retrocrural and upper abdominal adenopathy identified. At the level of the celiac trunk there is a right-sided periaortic lymph node measuring 2 cm, image 65/series 2. Left periaortic lymph node measures 1.4 cm, image 70/series 2. Left retrocrural lymph node measures 1.4 cm, image 57/series 2. Mild hepatic steatosis noted. The patient is status post cholecystectomy. The visualized portions of the pancreas and spleen are unremarkable. The adrenal glands are both normal.  Musculoskeletal: Review of the visualized osseous structures is significant for mild spondylosis within the thoracic spine. There are no aggressive lytic or sclerotic bone lesions.  IMPRESSION: 1. No acute cardiopulmonary abnormalities. 2. Progressive adenopathy within the chest and upper abdomen which is worrisome for metastatic adenopathy. 3. Small nonspecific nodules in the lungs are unchanged from previous exam 4. Emphysema.  Electronically Signed: By: Kerby Moors M.D. On: 03/20/2014 11:52    ASSESSMENT:  1. Metastatic papillary thyroid cancer, PET scan also demonstrates left infrahilar and suprahilar hypermetabolic activity. This will need to be addressed in future, status post bilateral lymph node dissection of the neck with multiple nodes involved, see path report above. 2. 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 S/P surgery followed by radiation therapy as well as chemotherapy consisting of carboplatin/paclitaxel x6 cycles (11/12/11- 02/25/2012). She was offered maintenance Alimta which she has declined. Presently she is been observed and presently has no evidence of obvious disease by MRI scan of the brain . Recent CAT scan of the chest revealed progressive hilar mediastinal adenopathy with the new appearance of retrocrural adenopathy. 3. DVT left leg with pulmonary embolus, S/P >1 year worth of  anticoagulation. Thought to be secondary to malignancy which is now in remission and she stopped Coumadin in July 2014.  4. S/P Port-A-Cath removal on 08/10/2012  5. S/P IVC filter removed on 08/10/2012    PLAN:  #1. Referral to Dr.Nida for consideration of a radioactive iodine therapy for residual disease not surgically removed in the neck. #2. Referral to thoracic surgery for bronchoscopy and endoscopic ultrasound-guided mediastinal lymph node biopsy to determine tissue type of the abnormalities noted on CT scan.  #3. Follow-up in 5 weeks with CBC, chem profile, thyroglobulin, thyroglobulin antibody #4. Continue fentanyl 100 patch every 3 days along with Dilaudid 4 mg every 2-4 hours as needed for breakthrough pain.  All questions were answered. The patient knows to call the clinic with any problems, questions  or concerns. We can certainly see the patient much sooner if necessary.   I spent 30 minutes counseling the patient face to face. The total time spent in the appointment was 40 minutes.    Doroteo Bradford, MD 03/21/2014 2:39 PM  DISCLAIMER:  This note was dictated with voice recognition software.  Similar sounding words can inadvertently be transcribed inaccurately and may not be corrected upon review.

## 2014-03-25 ENCOUNTER — Telehealth (HOSPITAL_COMMUNITY): Payer: Self-pay | Admitting: Oncology

## 2014-03-25 ENCOUNTER — Encounter: Payer: Medicare Other | Admitting: Thoracic Surgery (Cardiothoracic Vascular Surgery)

## 2014-03-25 NOTE — Telephone Encounter (Signed)
[name] called reporting increased neck swelling and increased shortness of breath since starting Fentanyl 100 mcg/hr.  Dr. Barnet Glasgow increased her Fentanyl to 100 mcg/hr from 50 mcg/hr.  They called the pharmacist reporting these symptoms and the pharmacist deferred to [place].  Allergic reaction is unlikely, but I have advised the patient's daughter to remove the patch.  Use Dilaudid (not a new medication for her) for pain control.  Take 50 mg of Benadryl.  Report to ED with any issues at all, including tongue swelling, increased SOD/dyspnea, unable to care for secretions, back pain, increased cough, etc.  They will call us back tomorrow with an update.  We may have to go back to 50 mcg/hr or 75 mcg/hr of Fentanyl.  Will see has she does over the next 24 hours.   Haley Lowe 03/25/2014

## 2014-03-26 ENCOUNTER — Other Ambulatory Visit: Payer: Self-pay | Admitting: *Deleted

## 2014-03-26 ENCOUNTER — Telehealth (HOSPITAL_COMMUNITY): Payer: Self-pay | Admitting: Oncology

## 2014-03-26 ENCOUNTER — Encounter: Payer: Self-pay | Admitting: Thoracic Surgery (Cardiothoracic Vascular Surgery)

## 2014-03-26 ENCOUNTER — Institutional Professional Consult (permissible substitution) (INDEPENDENT_AMBULATORY_CARE_PROVIDER_SITE_OTHER): Payer: Medicare Other | Admitting: Thoracic Surgery (Cardiothoracic Vascular Surgery)

## 2014-03-26 VITALS — BP 106/67 | HR 60 | Resp 20 | Ht 63.0 in | Wt 176.0 lb

## 2014-03-26 DIAGNOSIS — R599 Enlarged lymph nodes, unspecified: Secondary | ICD-10-CM

## 2014-03-26 DIAGNOSIS — R590 Localized enlarged lymph nodes: Secondary | ICD-10-CM

## 2014-03-26 DIAGNOSIS — C73 Malignant neoplasm of thyroid gland: Secondary | ICD-10-CM

## 2014-03-26 DIAGNOSIS — C7931 Secondary malignant neoplasm of brain: Secondary | ICD-10-CM

## 2014-03-26 DIAGNOSIS — R918 Other nonspecific abnormal finding of lung field: Secondary | ICD-10-CM

## 2014-03-26 MED ORDER — HYDROMORPHONE HCL ER 12 MG PO T24A: 12.0000 mg | EXTENDED_RELEASE_TABLET | ORAL | Status: DC

## 2014-03-26 NOTE — Progress Notes (Signed)
PCP is [name], MD Referring Provider is Farrel Gobble, MD  Chief Complaint  Patient presents with  . 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  Haley Lowe is a 58 year old woman who in 2013 was found to have a brain mass. She had a resection and it was felt to be metastatic adenocarcinoma from unknown primary. More recently she had a primary papillary thyroid carcinoma. She had a thyroidectomy and neck dissection. She then had a second radical neck dissection for removal of additional lymph nodes.  She had a chest CT on December 16 which showed an increase in size of left hilar lymph nodes in comparison to a CT in May.  She says that she is still recovering from her neck surgery. She does have some difficulty swallowing. She has a lot of swelling in the neck and face. She denies any shortness of breath. She has an occasional cough. She says her weight has been relatively stable and her appetite remains good.   Past Medical History  Diagnosis Date  . Hypertension   . PONV (postoperative nausea and vomiting)   . Anxiety   . Depression   . COPD (chronic obstructive pulmonary disease)   . GERD (gastroesophageal reflux disease)   . Headache(784.0)   . Arthritis   . Adenocarcinoma 09/28/2011  . Hx of adenomatous colonic polyps   . Pulmonary nodules   . Diabetes mellitus   . Emphysema   . 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 Allergy     hips and shoulders  . Cataract     1 lens implaqnt right eye,intact on left eye  . Goiter     nodule watching  . Fibromyalgia     neuropathy tops of both feet  . Metastasis to brain 09/15/11     left frontoparietal region  . History of radiation therapy 10/06/11    1 fraction  15 gray  . Pulmonary  emboli 11/04/11    right upper lobe and r lower lobe PE  . Lung nodule seen on imaging study 11/04/11 CT    43mm LLL  . DVT (deep venous thrombosis) 12/09/2011  . Pulmonary embolism 12/09/2011  . Hyperlipidemia   . History of radiation therapy 10/06/11    SRS 15Gy 66fx, brain  . Metastatic adenocarcinoma to brain, unknown primary   . Thyroid cancer 2015  . UTI (lower urinary tract infection)     Past Surgical History  Procedure Laterality Date  . Abdominal hysterectomy  1996  . Cholecystectomy  2000  . Shoulder surgery  1998    right  . Eye surgery  2001    vision correction  . Foot surgery  2005    left  . Craniotomy  09/15/2011    Procedure: CRANIOTOMY TUMOR EXCISION;  Surgeon: Hosie Spangle, MD;  Location: [facility];  Service: Neurosurgery;  Laterality: N/A;  Craniotomy resection of tumor with stealth  . Cataract extraction  2011    with lens implant  . Total thyroidectomy  10-29-2013    [facility]  . Lymph gland excision      Family History  Problem Relation Age of Onset  . Asthma Mother   . Kidney failure Father   . Diabetes Sister   .  Heart attack Sister   . Colon cancer Brother   . COPD Sister   . Aneurysm Paternal Grandmother     brain  . Parkinsonism Maternal Uncle   . COPD Brother     Social History History  Substance Use Topics  . Smoking status: Former Smoker -- 1.00 packs/day for 40 years    Types: Cigarettes    Quit date: 04/06/2011  . Smokeless tobacco: Never Used  . Alcohol Use: No    Current Outpatient Prescriptions  Medication Sig Dispense Refill  . ALPRAZolam (XANAX) 0.5 MG tablet Take 0.5 mg by mouth 3 (three) times daily as needed for anxiety.     Marland Kitchen aspirin 81 MG chewable tablet Chew 81 mg by mouth daily.    . cetirizine (ZYRTEC) 10 MG tablet Take 10 mg by mouth daily.    . Cholecalciferol (VITAMIN D-3 PO) Take 2,000 Units by mouth daily.    . furosemide (LASIX) 40 MG tablet Take 40 mg by mouth. Takes every other  day    . HYDROmorphone (DILAUDID) 4 MG tablet Take one tablet up to every 3 hours as needed for breakthrough pain. 100 tablet 0  . ibuprofen (ADVIL,MOTRIN) 800 MG tablet Take 800 mg by mouth every 8 (eight) hours as needed for pain.     Marland Kitchen levothyroxine (SYNTHROID, LEVOTHROID) 125 MCG tablet Take 125 mcg by mouth daily before breakfast.    . metoprolol tartrate (LOPRESSOR) 25 MG tablet Take 25 mg by mouth 2 (two) times daily.    Marland Kitchen olmesartan-hydrochlorothiazide (BENICAR HCT) 20-12.5 MG per tablet Take 1 tablet by mouth daily. Takes 1/4 tablet daily    . polyethylene glycol (MIRALAX / GLYCOLAX) packet Take 17 g by mouth daily. For constipation    . ranitidine (ZANTAC) 150 MG tablet Take 150 mg by mouth 2 (two) times daily.    . simvastatin (ZOCOR) 10 MG tablet Take 10 mg by mouth at bedtime.    Marland Kitchen zolpidem (AMBIEN) 10 MG tablet Take 10 mg by mouth at bedtime as needed for sleep.     No current facility-administered medications for this visit.    Allergies  Allergen Reactions  . Penicillins Swelling    " my brain swelled, and mymouth"  . Codeine Itching    Mild takes benadryl  . Tape Rash    Review of Systems  Constitutional: Positive for activity change. Negative for fever, chills, appetite change and unexpected weight change.  HENT: Positive for trouble swallowing.   Respiratory: Negative for shortness of breath.   Gastrointestinal: Positive for abdominal pain.  Endocrine:       Thyroidectomy  All other systems reviewed and are negative.   BP 106/67 mmHg  Pulse 60  Resp 20  Ht 5\' 3"  (1.6 m)  Wt 176 lb (79.833 kg)  BMI 31.18 kg/m2  SpO2 95% Physical Exam  Constitutional: She is oriented to person, place, and time. She appears well-developed and well-nourished. No distress.  HENT:  Head: Normocephalic and atraumatic.  Eyes: EOM are normal. Pupils are equal, round, and reactive to light.  Neck:  Well healed surgical scar   Cardiovascular: Normal rate, regular rhythm and  normal heart sounds.   No murmur heard. Pulmonary/Chest: Effort normal and breath sounds normal. She has no wheezes. She has no rales.  Abdominal: Soft. There is no tenderness.  Musculoskeletal: She exhibits no edema.  Lymphadenopathy:    She has no cervical adenopathy.  Neurological: She is alert and oriented to person, place, and time. No  cranial nerve deficit.  Skin: Skin is warm and dry.  Vitals reviewed.    Diagnostic Tests: ADDENDUM REPORT: 03/21/2014 14:11  ADDENDUM: Clarification is made to the body of the report of the chest CT of 03/20/2014.  Sentence in the first paragraph of the body of the report should be corrected to state:  Progressive LEFT hilar adenopathy, with index LEFT hilar lymph node 12 mm short axis series 2, image 31, previously 3 mm (not 3.2 cm as previously reported).  Discussed with Haley Crigler PA on 03/21/2014 at 1405 hours.   Electronically Signed  By: Lavonia Dana M.D.  On: 03/21/2014 14:11      Study Result     CLINICAL DATA: Primary papillary thyroid cancer. Followup hilar adenopathy.  EXAM: CT CHEST WITH CONTRAST  TECHNIQUE: Multidetector CT imaging of the chest was performed during intravenous contrast administration.  CONTRAST: 46mL OMNIPAQUE IOHEXOL 300 MG/ML SOLN  COMPARISON: 08/06/2013 and PET-CT from 12/24/2013.  FINDINGS: Mediastinum: The heart size appears normal. No pericardial effusion. Calcified atherosclerotic disease involves the thoracic aorta. Progressive left hilar adenopathy. Index lymph node on the left measures 1.2 cm, image 31/series 2. Previously 3.2 cm. Left hilar lymph node measures 1 cm, image 35/series 2. New from previous exam.  Lungs/Pleura: No pleural effusion. Moderate, upper lobe predominant changes of centrilobular emphysema identified. There is a nodule in the left lower lobe which measures 3 mm, image 49/series 3. This is unchanged from the previous exam. Stable right  upper lobe nodule measuring 3 mm, image 24/series 3.  Upper Abdomen: New retrocrural and upper abdominal adenopathy identified. At the level of the celiac trunk there is a right-sided periaortic lymph node measuring 2 cm, image 65/series 2. Left periaortic lymph node measures 1.4 cm, image 70/series 2. Left retrocrural lymph node measures 1.4 cm, image 57/series 2. Mild hepatic steatosis noted. The patient is status post cholecystectomy. The visualized portions of the pancreas and spleen are unremarkable. The adrenal glands are both normal.  Musculoskeletal: Review of the visualized osseous structures is significant for mild spondylosis within the thoracic spine. There are no aggressive lytic or sclerotic bone lesions.  IMPRESSION: 1. No acute cardiopulmonary abnormalities. 2. Progressive adenopathy within the chest and upper abdomen which is worrisome for metastatic adenopathy. 3. Small nonspecific nodules in the lungs are unchanged from previous exam 4. Emphysema.  Electronically Signed: By: Kerby Moors M.D. On: 03/20/2014 11:52      Impression: 58 year old woman with a history of metastatic adenocarcinoma of the brain from unknown primary and papillary thyroid carcinoma with extensive lymph node metastases, who now has left hilar adenopathy. These nodes are not massively enlarged but are definitely larger than they were back in May of this year. Dr. Barnet Glasgow wishes to have the nodes sampled to see if they are related to her papillary thyroid carcinoma or possibly an occult lung primary.  I described the proposed procedure, bronchoscopy and endobronchial ultrasound, with Mr. and Mrs. Haley Lowe. We discussed the indications, risks, benefits, and alternatives. They understand that this is an endoscopic procedure done on an outpatient basis. We will do it in the operating room with general anesthesia. They do understand that the success rate with endobronchial ultrasound  aspirations is in the 85% range. The understand the risks include, but are not limited to those associated with general anesthesia, as well as bleeding, pneumothorax, failure to make a diagnosis, as well as the possibility of unforeseeable, occasions. She is anxious but accepts the risk and wishes to proceed  Plan: Bronchoscopy and endobronchial ultrasound on Thursday, January 7.

## 2014-03-26 NOTE — Telephone Encounter (Signed)
Since stopping Fentanyl, she reports she feels much improved since stopping Fentanyl.  She asks about a long-acting Dilaudid.  She is tolerating short-acting dilaudid.  Therefore, it is reasonable to assume that she would tolerate Dilaudid ER (Exalgo) 12 mg every 24 hours.    Cirilo Canner 03/26/2014

## 2014-03-27 ENCOUNTER — Telehealth (HOSPITAL_COMMUNITY): Payer: Self-pay | Admitting: *Deleted

## 2014-03-27 ENCOUNTER — Other Ambulatory Visit (HOSPITAL_COMMUNITY): Payer: Self-pay | Admitting: Hematology & Oncology

## 2014-03-27 MED ORDER — OXYCODONE HCL ER 15 MG PO T12A: 15.0000 mg | EXTENDED_RELEASE_TABLET | Freq: Two times a day (BID) | ORAL | Status: DC

## 2014-04-01 ENCOUNTER — Other Ambulatory Visit (HOSPITAL_COMMUNITY): Payer: Self-pay | Admitting: Internal Medicine

## 2014-04-01 ENCOUNTER — Ambulatory Visit (HOSPITAL_COMMUNITY)
Admission: RE | Admit: 2014-04-01 | Discharge: 2014-04-01 | Disposition: A | Discharge status: Home / Self Care | Payer: Medicare Other | Source: Ambulatory Visit | Attending: Internal Medicine | Admitting: Internal Medicine

## 2014-04-01 DIAGNOSIS — K5900 Constipation, unspecified: Secondary | ICD-10-CM | POA: Insufficient documentation

## 2014-04-01 DIAGNOSIS — R109 Unspecified abdominal pain: Secondary | ICD-10-CM

## 2014-04-02 ENCOUNTER — Other Ambulatory Visit (HOSPITAL_COMMUNITY): Payer: Self-pay | Admitting: Internal Medicine

## 2014-04-02 DIAGNOSIS — R109 Unspecified abdominal pain: Secondary | ICD-10-CM

## 2014-04-02 DIAGNOSIS — R599 Enlarged lymph nodes, unspecified: Secondary | ICD-10-CM

## 2014-04-02 DIAGNOSIS — R591 Generalized enlarged lymph nodes: Secondary | ICD-10-CM

## 2014-04-03 ENCOUNTER — Ambulatory Visit (HOSPITAL_COMMUNITY)
Admission: RE | Admit: 2014-04-03 | Discharge: 2014-04-03 | Disposition: A | Discharge status: Home / Self Care | Payer: Medicare Other | Source: Ambulatory Visit | Attending: Internal Medicine | Admitting: Internal Medicine

## 2014-04-03 DIAGNOSIS — C73 Malignant neoplasm of thyroid gland: Secondary | ICD-10-CM | POA: Diagnosis not present

## 2014-04-03 DIAGNOSIS — Z90710 Acquired absence of both cervix and uterus: Secondary | ICD-10-CM | POA: Diagnosis not present

## 2014-04-03 DIAGNOSIS — R599 Enlarged lymph nodes, unspecified: Secondary | ICD-10-CM | POA: Insufficient documentation

## 2014-04-03 DIAGNOSIS — K5900 Constipation, unspecified: Secondary | ICD-10-CM | POA: Insufficient documentation

## 2014-04-03 DIAGNOSIS — R109 Unspecified abdominal pain: Secondary | ICD-10-CM

## 2014-04-03 DIAGNOSIS — I77811 Abdominal aortic ectasia: Secondary | ICD-10-CM | POA: Diagnosis not present

## 2014-04-03 DIAGNOSIS — R110 Nausea: Secondary | ICD-10-CM | POA: Insufficient documentation

## 2014-04-03 DIAGNOSIS — I7090 Unspecified atherosclerosis: Secondary | ICD-10-CM | POA: Diagnosis not present

## 2014-04-03 DIAGNOSIS — C3490 Malignant neoplasm of unspecified part of unspecified bronchus or lung: Secondary | ICD-10-CM | POA: Insufficient documentation

## 2014-04-03 DIAGNOSIS — Z9049 Acquired absence of other specified parts of digestive tract: Secondary | ICD-10-CM | POA: Diagnosis not present

## 2014-04-03 DIAGNOSIS — R591 Generalized enlarged lymph nodes: Secondary | ICD-10-CM

## 2014-04-03 DIAGNOSIS — R1084 Generalized abdominal pain: Secondary | ICD-10-CM | POA: Insufficient documentation

## 2014-04-03 DIAGNOSIS — K769 Liver disease, unspecified: Secondary | ICD-10-CM | POA: Insufficient documentation

## 2014-04-03 MED ORDER — IOHEXOL 300 MG/ML  SOLN
100.0000 mL | Freq: Once | INTRAMUSCULAR | Status: AC | PRN
  Administered 2014-04-03: 100 mL via INTRAVENOUS

## 2014-04-04 ENCOUNTER — Other Ambulatory Visit (HOSPITAL_COMMUNITY): Payer: Medicare Other

## 2014-04-05 ENCOUNTER — Encounter (HOSPITAL_COMMUNITY): Payer: Self-pay | Admitting: *Deleted

## 2014-04-05 ENCOUNTER — Emergency Department (HOSPITAL_COMMUNITY)
Admission: EM | Admit: 2014-04-05 | Discharge: 2014-04-05 | Disposition: A | Discharge status: Home / Self Care | Payer: Medicare Other | Attending: Emergency Medicine | Admitting: Emergency Medicine

## 2014-04-05 DIAGNOSIS — I10 Essential (primary) hypertension: Secondary | ICD-10-CM | POA: Insufficient documentation

## 2014-04-05 DIAGNOSIS — Z86010 Personal history of colonic polyps: Secondary | ICD-10-CM | POA: Diagnosis not present

## 2014-04-05 DIAGNOSIS — R590 Localized enlarged lymph nodes: Secondary | ICD-10-CM | POA: Diagnosis not present

## 2014-04-05 DIAGNOSIS — Z87440 Personal history of urinary (tract) infections: Secondary | ICD-10-CM | POA: Diagnosis not present

## 2014-04-05 DIAGNOSIS — E785 Hyperlipidemia, unspecified: Secondary | ICD-10-CM | POA: Diagnosis not present

## 2014-04-05 DIAGNOSIS — R109 Unspecified abdominal pain: Secondary | ICD-10-CM | POA: Diagnosis present

## 2014-04-05 DIAGNOSIS — M1990 Unspecified osteoarthritis, unspecified site: Secondary | ICD-10-CM | POA: Diagnosis not present

## 2014-04-05 DIAGNOSIS — Z923 Personal history of irradiation: Secondary | ICD-10-CM | POA: Insufficient documentation

## 2014-04-05 DIAGNOSIS — Z87891 Personal history of nicotine dependence: Secondary | ICD-10-CM | POA: Insufficient documentation

## 2014-04-05 DIAGNOSIS — J449 Chronic obstructive pulmonary disease, unspecified: Secondary | ICD-10-CM | POA: Insufficient documentation

## 2014-04-05 DIAGNOSIS — R1084 Generalized abdominal pain: Secondary | ICD-10-CM | POA: Diagnosis not present

## 2014-04-05 DIAGNOSIS — E119 Type 2 diabetes mellitus without complications: Secondary | ICD-10-CM | POA: Insufficient documentation

## 2014-04-05 DIAGNOSIS — Z85841 Personal history of malignant neoplasm of brain: Secondary | ICD-10-CM | POA: Diagnosis not present

## 2014-04-05 DIAGNOSIS — F329 Major depressive disorder, single episode, unspecified: Secondary | ICD-10-CM | POA: Insufficient documentation

## 2014-04-05 DIAGNOSIS — F419 Anxiety disorder, unspecified: Secondary | ICD-10-CM | POA: Diagnosis not present

## 2014-04-05 DIAGNOSIS — K219 Gastro-esophageal reflux disease without esophagitis: Secondary | ICD-10-CM | POA: Diagnosis not present

## 2014-04-05 DIAGNOSIS — Z7982 Long term (current) use of aspirin: Secondary | ICD-10-CM | POA: Insufficient documentation

## 2014-04-05 DIAGNOSIS — Z880 Allergy status to penicillin: Secondary | ICD-10-CM | POA: Insufficient documentation

## 2014-04-05 DIAGNOSIS — Z8669 Personal history of other diseases of the nervous system and sense organs: Secondary | ICD-10-CM | POA: Insufficient documentation

## 2014-04-05 DIAGNOSIS — Z85850 Personal history of malignant neoplasm of thyroid: Secondary | ICD-10-CM | POA: Insufficient documentation

## 2014-04-05 DIAGNOSIS — Z86711 Personal history of pulmonary embolism: Secondary | ICD-10-CM | POA: Diagnosis not present

## 2014-04-05 LAB — CBC WITH DIFFERENTIAL/PLATELET
BASOS ABS: 0.1 10*3/uL (ref 0.0–0.1)
BASOS PCT: 1 % (ref 0–1)
Eosinophils Absolute: 0.2 10*3/uL (ref 0.0–0.7)
Eosinophils Relative: 2 % (ref 0–5)
HEMATOCRIT: 48.4 % — AB (ref 36.0–46.0)
Hemoglobin: 16.3 g/dL — ABNORMAL HIGH (ref 12.0–15.0)
Lymphocytes Relative: 22 % (ref 12–46)
Lymphs Abs: 1.6 10*3/uL (ref 0.7–4.0)
MCH: 29.2 pg (ref 26.0–34.0)
MCHC: 33.7 g/dL (ref 30.0–36.0)
MCV: 86.6 fL (ref 78.0–100.0)
Monocytes Absolute: 0.8 10*3/uL (ref 0.1–1.0)
Monocytes Relative: 11 % (ref 3–12)
NEUTROS ABS: 4.5 10*3/uL (ref 1.7–7.7)
Neutrophils Relative %: 64 % (ref 43–77)
PLATELETS: 343 10*3/uL (ref 150–400)
RBC: 5.59 MIL/uL — ABNORMAL HIGH (ref 3.87–5.11)
RDW: 12.6 % (ref 11.5–15.5)
WBC: 7.1 10*3/uL (ref 4.0–10.5)

## 2014-04-05 LAB — URINALYSIS, ROUTINE W REFLEX MICROSCOPIC
Bilirubin Urine: NEGATIVE
GLUCOSE, UA: NEGATIVE mg/dL
HGB URINE DIPSTICK: NEGATIVE
Ketones, ur: NEGATIVE mg/dL
LEUKOCYTES UA: NEGATIVE
NITRITE: NEGATIVE
PH: 6 (ref 5.0–8.0)
Protein, ur: NEGATIVE mg/dL
Urobilinogen, UA: 0.2 mg/dL (ref 0.0–1.0)

## 2014-04-05 LAB — COMPREHENSIVE METABOLIC PANEL
ALBUMIN: 4.5 g/dL (ref 3.5–5.2)
ALK PHOS: 95 U/L (ref 39–117)
ALT: 23 U/L (ref 0–35)
AST: 22 U/L (ref 0–37)
Anion gap: 10 (ref 5–15)
BILIRUBIN TOTAL: 0.7 mg/dL (ref 0.3–1.2)
BUN: 9 mg/dL (ref 6–23)
CALCIUM: 9.2 mg/dL (ref 8.4–10.5)
CO2: 25 mmol/L (ref 19–32)
Chloride: 97 mEq/L (ref 96–112)
Creatinine, Ser: 0.68 mg/dL (ref 0.50–1.10)
GFR calc non Af Amer: >90 mL/min (ref >90)
Glucose, Bld: 123 mg/dL — ABNORMAL HIGH (ref 70–99)
Potassium: 3.5 mmol/L (ref 3.5–5.1)
SODIUM: 132 mmol/L — AB (ref 135–145)
TOTAL PROTEIN: 7.6 g/dL (ref 6.0–8.3)

## 2014-04-05 LAB — LIPASE, BLOOD: LIPASE: 42 U/L (ref 11–59)

## 2014-04-05 MED ORDER — ONDANSETRON 8 MG PO TBDP: 8.0000 mg | ORAL_TABLET | Freq: Three times a day (TID) | ORAL | Status: AC | PRN

## 2014-04-05 MED ORDER — MORPHINE SULFATE 2 MG/ML IJ SOLN
2.0000 mg | Freq: Once | INTRAMUSCULAR | Status: AC
  Administered 2014-04-05: 2 mg via INTRAVENOUS
  Filled 2014-04-05: qty 1

## 2014-04-05 MED ORDER — OXYCODONE-ACETAMINOPHEN 5-325 MG PO TABS: 1.0000 | ORAL_TABLET | ORAL | Status: DC | PRN

## 2014-04-05 MED ORDER — KETOROLAC TROMETHAMINE 30 MG/ML IJ SOLN
30.0000 mg | Freq: Once | INTRAMUSCULAR | Status: AC
  Administered 2014-04-05: 30 mg via INTRAVENOUS
  Filled 2014-04-05: qty 1

## 2014-04-05 MED ORDER — OXYCODONE-ACETAMINOPHEN 5-325 MG PO TABS
2.0000 | ORAL_TABLET | Freq: Once | ORAL | Status: AC
  Administered 2014-04-05: 2 via ORAL
  Filled 2014-04-05: qty 2

## 2014-04-05 MED ORDER — SODIUM CHLORIDE 0.9 % IV BOLUS (SEPSIS)
1000.0000 mL | Freq: Once | INTRAVENOUS | Status: AC
  Administered 2014-04-05: 1000 mL via INTRAVENOUS

## 2014-04-05 NOTE — ED Notes (Addendum)
abd pain for 2 weeks,  And back pain seen here on 12/17 and 12/22,  Has been taking enemas and milk and molasses enemas, Pain in back also.  Cancer in brain , thyroid- thyroidectomy done. And neck surgery.  Being seen in [place] here.  Had  abd ,ct scan  Here.

## 2014-04-05 NOTE — ED Provider Notes (Signed)
CSN: [id number]     Arrival date & time 04/05/14  1453 History  This chart was scribed for Shaune Pollack, MD by [name], ED Scribe. This patient was seen in room APA05/APA05 and the patient's care was started at 3:45 PM.    Chief Complaint  Patient presents with  . Abdominal Pain   Patient is a 59 y.o. female presenting with abdominal pain. The history is provided by the patient and a relative. No language interpreter was used.  Abdominal Pain Pain location:  Generalized Pain quality: cramping   Pain radiates to:  Back Pain severity:  Moderate Duration:  2 weeks Timing:  Constant Progression:  Worsening Chronicity:  New Relieved by:  NSAIDs Ineffective treatments: enemas. Associated symptoms: chills and cough   Associated symptoms: no chest pain and no fever   Risk factors: multiple surgeries      HPI Comments: Haley Lowe is a 59 y.o. female with a history of metastatic papillary thyroid cancer and hypertension who presents to the Emergency Department complaining of constant, non-migrating, worsening, 6/10, cramping abdominal pain radiating to her mid-to-low back onset 2 weeks ago.  Patient endorses chills, diaphoresis, productive cough with clear sputum, reduced PO intake, and per daughter, rapid weight loss. Patient vomited once, and saw Dr. Gerarda Fraction who did a CT scan and stated that patient was full of stool and needed to void her bowels. Per daughter, patient had a UTI about 2 weeks ago. Ibuprofen 800 mg alleviates her symptoms. Patient also tried 8-oz milk and molasses enemas, as well as other types of pain medication that were ineffective. Patient has a surgical history of cholecystectomy, hysterectomy, total thyroidectomy, and lymph gland excision. She denies a history of hepatomegaly or pancreatitis. She denies chest pain and fever. She denies smoking and EtOH consumption. Patient has known allergies to penicillin.    Past Medical History  Diagnosis Date  . Hypertension   .  PONV (postoperative nausea and vomiting)   . Anxiety   . Depression   . COPD (chronic obstructive pulmonary disease)   . GERD (gastroesophageal reflux disease)   . Headache(784.0)   . Arthritis   . Adenocarcinoma 09/28/2011  . Hx of adenomatous colonic polyps   . Pulmonary nodules   . Diabetes mellitus   . Emphysema   . 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 Allergy     hips and shoulders  . Cataract     1 lens implaqnt right eye,intact on left eye  . Goiter     nodule watching  . Fibromyalgia     neuropathy tops of both feet  . Metastasis to brain 09/15/11     left frontoparietal region  . History of radiation therapy 10/06/11    1 fraction  15 gray  . Pulmonary emboli 11/04/11    right upper lobe and r lower lobe PE  . Lung nodule seen on imaging study 11/04/11 CT    46mm LLL  . DVT (deep venous thrombosis) 12/09/2011  . Pulmonary embolism 12/09/2011  . Hyperlipidemia   . History of radiation therapy 10/06/11    SRS 15Gy 45fx, brain  . Metastatic adenocarcinoma to brain, unknown primary   . Thyroid cancer 2015  . UTI (lower urinary tract infection)    Past Surgical History  Procedure Laterality Date  . Abdominal hysterectomy  1996  . Cholecystectomy  2000  . Shoulder surgery  1998    right  . Eye surgery  2001    vision correction  . Foot surgery  2005    left  . Craniotomy  09/15/2011    Procedure: CRANIOTOMY TUMOR EXCISION;  Surgeon: Hosie Spangle, MD;  Location: [place] [facility];  Service: Neurosurgery;  Laterality: N/A;  Craniotomy resection of tumor with stealth  . Cataract extraction  2011    with lens implant  . Total thyroidectomy  10-29-2013    [facility]  . Lymph gland excision     Family History  Problem Relation Age of Onset  . Asthma Mother   . Kidney failure Father   . Diabetes Sister   . Heart attack Sister   . Colon cancer Brother   . COPD Sister   . Aneurysm Paternal Grandmother     brain  .  Parkinsonism Maternal Uncle   . COPD Brother    History  Substance Use Topics  . Smoking status: Former Smoker -- 1.00 packs/day for 40 years    Types: Cigarettes    Quit date: 04/06/2011  . Smokeless tobacco: Never Used  . Alcohol Use: No   OB History    No data available     Review of Systems  Constitutional: Positive for chills, diaphoresis, appetite change and unexpected weight change. Negative for fever.  Respiratory: Positive for cough.   Cardiovascular: Negative for chest pain.  Gastrointestinal: Positive for abdominal pain.  Musculoskeletal: Positive for back pain.  All other systems reviewed and are negative.     Allergies  Penicillins; Codeine; and Tape  Home Medications   Prior to Admission medications   Medication Sig Start Date End Date Taking? Authorizing Provider  ALPRAZolam Duanne Moron) 0.5 MG tablet Take 0.5 mg by mouth 3 (three) times daily as needed for anxiety.  01/04/12   Historical Provider, MD  aspirin 81 MG chewable tablet Chew 81 mg by mouth daily.    Historical Provider, MD  cetirizine (ZYRTEC) 10 MG tablet Take 10 mg by mouth daily.    Historical Provider, MD  Cholecalciferol (VITAMIN D-3 PO) Take 2,000 Units by mouth daily.    Historical Provider, MD  furosemide (LASIX) 40 MG tablet Take 40 mg by mouth. Takes every other day    Historical Provider, MD  HYDROmorphone (DILAUDID) 4 MG tablet Take one tablet up to every 3 hours as needed for breakthrough pain. 03/04/14   Farrel Gobble, MD  HYDROmorphone HCl (EXALGO) 12 MG T24A SR tablet Take 1 tablet (12 mg total) by mouth daily. 03/26/14   Baird Cancer, PA-C  ibuprofen (ADVIL,MOTRIN) 800 MG tablet Take 800 mg by mouth every 8 (eight) hours as needed for pain.     Historical Provider, MD  levothyroxine (SYNTHROID, LEVOTHROID) 125 MCG tablet Take 125 mcg by mouth daily before breakfast.    Historical Provider, MD  metoprolol tartrate (LOPRESSOR) 25 MG tablet Take 25 mg by mouth 2 (two) times daily.     Historical Provider, MD  olmesartan-hydrochlorothiazide (BENICAR HCT) 20-12.5 MG per tablet Take 1 tablet by mouth daily. Takes 1/4 tablet daily    Historical Provider, MD  OxyCODONE (OXYCONTIN) 15 mg T12A 12 hr tablet Take 1 tablet (15 mg total) by mouth every 12 (twelve) hours. 03/27/14   Molli Hazard, MD  polyethylene glycol (MIRALAX / GLYCOLAX) packet Take 17 g by mouth daily. For constipation    Historical Provider, MD  ranitidine (ZANTAC) 150 MG tablet Take 150 mg by mouth 2 (two) times daily.    Historical Provider, MD  simvastatin (ZOCOR) 10 MG tablet Take 10 mg by mouth at bedtime.    Historical Provider, MD  zolpidem (AMBIEN) 10 MG tablet Take 10 mg by mouth at bedtime as needed for sleep.    Historical Provider, MD   BP 117/82 mmHg  Pulse 92  Temp(Src) 98.6 F (37 C) (Oral)  Resp 18  Ht 5\' 3"  (1.6 m)  Wt 165 lb 8 oz (75.07 kg)  BMI 29.32 kg/m2  SpO2 97% Physical Exam  Constitutional: She is oriented to person, place, and time. She appears well-developed and well-nourished.  HENT:  Head: Normocephalic and atraumatic.  Eyes: EOM are normal. Pupils are equal, round, and reactive to light.  Neck: Neck supple.  Cardiovascular: Normal rate and regular rhythm.   Pulmonary/Chest: No respiratory distress. She has no wheezes.  Abdominal: Soft. She exhibits no distension. There is no tenderness.  Musculoskeletal: Normal range of motion. She exhibits no edema.  Neurological: She is alert and oriented to person, place, and time. No cranial nerve deficit.  Skin: Skin is warm and dry.  Psychiatric: She has a normal mood and affect.  Nursing note and vitals reviewed.   ED Course  Procedures (including critical care time) Labs Review Labs Reviewed  CBC WITH DIFFERENTIAL  COMPREHENSIVE METABOLIC PANEL    Imaging Review Ct Abdomen Pelvis W Contrast  04/03/2014   CLINICAL DATA:  59 year old female with generalized abdominal pain and nausea for 1 month. Constipation for  the past 2-3 days. History of thyroid and lung cancer diagnosed in 2013 on chemotherapy.  EXAM: CT ABDOMEN AND PELVIS WITH CONTRAST  TECHNIQUE: Multidetector CT imaging of the abdomen and pelvis was performed using the standard protocol following bolus administration of intravenous contrast.  CONTRAST:  141mL OMNIPAQUE IOHEXOL 300 MG/ML  SOLN  COMPARISON:  CT of the chest, abdomen and pelvis 08/06/2013.  FINDINGS: Lower chest: Retrocrural lymphadenopathy bilaterally measuring up to 14 mm in short axis on the right side.  Hepatobiliary: Sub cm low-attenuation lesion in the caudate lobe of the liver is too small to characterize, but is unchanged compared to the prior study, favored to represent a small cyst. No other cystic or solid hepatic lesions. No intra or extrahepatic biliary ductal dilatation. Status post cholecystectomy.  Pancreas: Unremarkable.  Spleen: Unremarkable.  Adrenals/Urinary Tract: Bilateral kidneys and bilateral adrenal glands are normal in appearance. No hydroureteronephrosis. Urinary bladder is normal in appearance.  Stomach/Bowel: Normal appearance of the stomach. No pathologic dilatation of small bowel or colon.  Vascular/Lymphatic: Extensive atherosclerosis, including fusiform ectasia of the infrarenal abdominal aorta which measures up to 2.9 x 2.7 cm. Extensive lymphadenopathy noted in the retroperitoneum, with the largest retroperitoneal lymph node between the aorta and inferior vena cava measuring 21 mm in short axis (image 22 of series 2). No pelvic lymphadenopathy noted.  Reproductive: Status post hysterectomy. Ovaries are not confidently identified may be surgically absent or atrophic.  Other: No significant volume of ascites.  No pneumoperitoneum.  Musculoskeletal: There are no aggressive appearing lytic or blastic lesions noted in the visualized portions of the skeleton.  IMPRESSION: 1. Extensive retroperitoneal and bilateral retrocrural lymphadenopathy. Given the patient's history of  2 primary malignancies, these findings may reflect metastatic disease. Alternatively, underlying lymphoproliferative disorder could be considered. 2. No additional signs of metastatic disease elsewhere in the abdomen or pelvis. 3. No acute findings in the abdomen or pelvis. 4. Extensive atherosclerosis, including fusiform ectasia of the infrarenal abdominal aorta which measures up to 2.9 x 2.7 cm. 5. Additional incidental  findings, as above.   Electronically Signed   By: Vinnie Langton M.D.   On: 04/03/2014 21:43     EKG Interpretation None      MDM   Final diagnoses:  Generalized abdominal pain  Retroperitoneal lymphadenopathy    I personally performed the services described in this documentation, which was scribed in my presence. The recorded information has been reviewed and considered.   Reassessed and patient with abdomen remaining soft.  Reviewed prior radiology studies.  I do not think reimaging would currently be of benefit. Patient advised that she does not appear constipated.  She had stopped narcotic pain medicine three weeks ago as she thought it was causing constipation.  Plan restart pain medicine here.  She has a biopsy scheduled on January 7 at [facility].  Patient advised to continue po intake possibly adding ensure.  She voices understanding of plan.   Patient feels improved and is hungry. She is discharged home with a prescription for Percocet and Zofran. We have discussed all of her follow-up plans and return precautions and she and her husband and daughter voiced understanding. She will follow with her oncologist but will return here if worse.  Shaune Pollack, MD 04/05/14 [phone]

## 2014-04-05 NOTE — Discharge Instructions (Signed)

## 2014-04-08 ENCOUNTER — Other Ambulatory Visit (HOSPITAL_COMMUNITY): Payer: Self-pay | Admitting: Oncology

## 2014-04-08 ENCOUNTER — Telehealth (HOSPITAL_COMMUNITY): Payer: Self-pay | Admitting: *Deleted

## 2014-04-08 ENCOUNTER — Encounter (HOSPITAL_COMMUNITY): Payer: Self-pay | Admitting: Lab

## 2014-04-08 DIAGNOSIS — C7931 Secondary malignant neoplasm of brain: Secondary | ICD-10-CM

## 2014-04-08 DIAGNOSIS — C73 Malignant neoplasm of thyroid gland: Secondary | ICD-10-CM

## 2014-04-08 LAB — POC OCCULT BLOOD, ED: Fecal Occult Bld: NEGATIVE

## 2014-04-08 MED ORDER — FENTANYL 50 MCG/HR TD PT72: 50.0000 ug | MEDICATED_PATCH | TRANSDERMAL | Status: DC

## 2014-04-08 NOTE — Telephone Encounter (Signed)
Isn't she the one that Dr. Dorris Fetch declined seeing her for radioactive iodine treatment because her case was too complicated?  If so, refer to [facility] endocrinology.

## 2014-04-08 NOTE — Progress Notes (Signed)
Referral sent [facility] for Dr Melton Alar. Records faxed on 1/4. Patient aware of appointment.

## 2014-04-09 ENCOUNTER — Ambulatory Visit (HOSPITAL_COMMUNITY): Payer: BC Managed Care – PPO

## 2014-04-10 ENCOUNTER — Encounter (HOSPITAL_COMMUNITY)
Admission: RE | Admit: 2014-04-10 | Discharge: 2014-04-10 | Disposition: A | Discharge status: Home / Self Care | Payer: Medicare Other | Source: Ambulatory Visit | Attending: Thoracic Surgery (Cardiothoracic Vascular Surgery) | Admitting: Thoracic Surgery (Cardiothoracic Vascular Surgery)

## 2014-04-10 ENCOUNTER — Ambulatory Visit (HOSPITAL_COMMUNITY)
Admission: RE | Admit: 2014-04-10 | Discharge: 2014-04-10 | Disposition: A | Discharge status: Home / Self Care | Payer: Medicare Other | Source: Ambulatory Visit | Attending: Thoracic Surgery (Cardiothoracic Vascular Surgery) | Admitting: Thoracic Surgery (Cardiothoracic Vascular Surgery)

## 2014-04-10 ENCOUNTER — Encounter (HOSPITAL_COMMUNITY): Payer: Self-pay

## 2014-04-10 VITALS — BP 116/55 | HR 58 | Temp 98.5°F | Resp 18 | Ht 63.0 in | Wt 172.8 lb

## 2014-04-10 DIAGNOSIS — E785 Hyperlipidemia, unspecified: Secondary | ICD-10-CM | POA: Diagnosis not present

## 2014-04-10 DIAGNOSIS — R001 Bradycardia, unspecified: Secondary | ICD-10-CM

## 2014-04-10 DIAGNOSIS — I10 Essential (primary) hypertension: Secondary | ICD-10-CM | POA: Diagnosis not present

## 2014-04-10 DIAGNOSIS — R1310 Dysphagia, unspecified: Secondary | ICD-10-CM | POA: Diagnosis not present

## 2014-04-10 DIAGNOSIS — C799 Secondary malignant neoplasm of unspecified site: Secondary | ICD-10-CM | POA: Insufficient documentation

## 2014-04-10 DIAGNOSIS — F329 Major depressive disorder, single episode, unspecified: Secondary | ICD-10-CM | POA: Diagnosis not present

## 2014-04-10 DIAGNOSIS — M1990 Unspecified osteoarthritis, unspecified site: Secondary | ICD-10-CM | POA: Diagnosis not present

## 2014-04-10 DIAGNOSIS — Z7982 Long term (current) use of aspirin: Secondary | ICD-10-CM | POA: Diagnosis not present

## 2014-04-10 DIAGNOSIS — Z87891 Personal history of nicotine dependence: Secondary | ICD-10-CM | POA: Diagnosis not present

## 2014-04-10 DIAGNOSIS — R590 Localized enlarged lymph nodes: Secondary | ICD-10-CM | POA: Diagnosis present

## 2014-04-10 DIAGNOSIS — C7931 Secondary malignant neoplasm of brain: Secondary | ICD-10-CM | POA: Diagnosis not present

## 2014-04-10 DIAGNOSIS — Z880 Allergy status to penicillin: Secondary | ICD-10-CM | POA: Diagnosis not present

## 2014-04-10 DIAGNOSIS — F419 Anxiety disorder, unspecified: Secondary | ICD-10-CM | POA: Diagnosis not present

## 2014-04-10 DIAGNOSIS — C801 Malignant (primary) neoplasm, unspecified: Secondary | ICD-10-CM

## 2014-04-10 DIAGNOSIS — C73 Malignant neoplasm of thyroid gland: Secondary | ICD-10-CM | POA: Diagnosis not present

## 2014-04-10 DIAGNOSIS — Z01811 Encounter for preprocedural respiratory examination: Secondary | ICD-10-CM | POA: Insufficient documentation

## 2014-04-10 DIAGNOSIS — J449 Chronic obstructive pulmonary disease, unspecified: Secondary | ICD-10-CM | POA: Diagnosis not present

## 2014-04-10 DIAGNOSIS — M797 Fibromyalgia: Secondary | ICD-10-CM | POA: Diagnosis not present

## 2014-04-10 DIAGNOSIS — K219 Gastro-esophageal reflux disease without esophagitis: Secondary | ICD-10-CM | POA: Diagnosis not present

## 2014-04-10 DIAGNOSIS — Z86711 Personal history of pulmonary embolism: Secondary | ICD-10-CM | POA: Diagnosis not present

## 2014-04-10 DIAGNOSIS — E049 Nontoxic goiter, unspecified: Secondary | ICD-10-CM | POA: Diagnosis not present

## 2014-04-10 DIAGNOSIS — E119 Type 2 diabetes mellitus without complications: Secondary | ICD-10-CM | POA: Diagnosis not present

## 2014-04-10 LAB — CBC
HCT: 42.5 % (ref 36.0–46.0)
Hemoglobin: 13.6 g/dL (ref 12.0–15.0)
MCH: 28.4 pg (ref 26.0–34.0)
MCHC: 32 g/dL (ref 30.0–36.0)
MCV: 88.7 fL (ref 78.0–100.0)
PLATELETS: 242 10*3/uL (ref 150–400)
RBC: 4.79 MIL/uL (ref 3.87–5.11)
RDW: 12.6 % (ref 11.5–15.5)
WBC: 4.5 10*3/uL (ref 4.0–10.5)

## 2014-04-10 LAB — COMPREHENSIVE METABOLIC PANEL
ALK PHOS: 87 U/L (ref 39–117)
ALT: 24 U/L (ref 0–35)
AST: 24 U/L (ref 0–37)
Albumin: 3.9 g/dL (ref 3.5–5.2)
Anion gap: 13 (ref 5–15)
BILIRUBIN TOTAL: 0.3 mg/dL (ref 0.3–1.2)
BUN: 9 mg/dL (ref 6–23)
CALCIUM: 9.3 mg/dL (ref 8.4–10.5)
CO2: 23 mmol/L (ref 19–32)
Chloride: 106 mEq/L (ref 96–112)
Creatinine, Ser: 0.69 mg/dL (ref 0.50–1.10)
GFR calc Af Amer: >90 mL/min (ref >90)
Glucose, Bld: 86 mg/dL (ref 70–99)
Potassium: 4.5 mmol/L (ref 3.5–5.1)
Sodium: 142 mmol/L (ref 135–145)
Total Protein: 6.8 g/dL (ref 6.0–8.3)

## 2014-04-10 LAB — APTT: aPTT: 30 seconds (ref 24–37)

## 2014-04-10 LAB — PROTIME-INR
INR: 0.95 (ref 0.00–1.49)
Prothrombin Time: 12.8 seconds (ref 11.6–15.2)

## 2014-04-10 NOTE — Pre-Procedure Instructions (Signed)
Haley Lowe  04/10/2014   Your procedure is scheduled on:  04-11-2014   Thursday   Report to [facility] Admitting at t6:00  AM.   Call this number if you have problems the morning of surgery: [phone]   Remember:   Do not eat food or drink liquids after midnight.    Take these medicines the morning of surgery with A SIP OF WATER: alprazolam(xanax) as needed,cetirizine(Zyrtec),colace,pain medications as needed,levothyroxine(Synthroid),metoprolol(Lopressor),ondansetron(zofran),   Do not wear jewelry, make-up or nail polish.  Do not wear lotions, powders, or perfumes. You may not wear deodorant.  Do not shave 48 hours prior to surgery.   Do not bring valuables to the hospital.  [facility] is not responsible  for any belongings or valuables.               Contacts, dentures or bridgework may not be worn into surgery.   .                 Patients discharged the day of surgery will not be allowed to drive home.   Name and phone number of your driver:    Special Instructions: See asttached Sheet for instructions on CHG shower/bath   Please read over the following fact sheets that you were given: Pain Booklet and Surgical Site Infection Prevention

## 2014-04-11 ENCOUNTER — Ambulatory Visit (HOSPITAL_COMMUNITY): Payer: BC Managed Care – PPO

## 2014-04-11 ENCOUNTER — Ambulatory Visit (HOSPITAL_COMMUNITY)
Admission: RE | Admit: 2014-04-11 | Discharge: 2014-04-11 | Disposition: A | Discharge status: Home / Self Care | Payer: Medicare Other | Source: Ambulatory Visit | Attending: Thoracic Surgery (Cardiothoracic Vascular Surgery) | Admitting: Thoracic Surgery (Cardiothoracic Vascular Surgery)

## 2014-04-11 ENCOUNTER — Ambulatory Visit (HOSPITAL_COMMUNITY): Payer: Medicare Other | Admitting: Anesthesiology

## 2014-04-11 ENCOUNTER — Other Ambulatory Visit (HOSPITAL_COMMUNITY): Payer: Medicare Other

## 2014-04-11 ENCOUNTER — Encounter (HOSPITAL_COMMUNITY): Payer: Self-pay | Admitting: *Deleted

## 2014-04-11 ENCOUNTER — Encounter (HOSPITAL_COMMUNITY)
Admission: RE | Disposition: A | Discharge status: Home / Self Care | Payer: Self-pay | Source: Ambulatory Visit | Attending: Thoracic Surgery (Cardiothoracic Vascular Surgery)

## 2014-04-11 DIAGNOSIS — E785 Hyperlipidemia, unspecified: Secondary | ICD-10-CM | POA: Insufficient documentation

## 2014-04-11 DIAGNOSIS — Z86711 Personal history of pulmonary embolism: Secondary | ICD-10-CM | POA: Insufficient documentation

## 2014-04-11 DIAGNOSIS — M1990 Unspecified osteoarthritis, unspecified site: Secondary | ICD-10-CM | POA: Diagnosis not present

## 2014-04-11 DIAGNOSIS — Z87891 Personal history of nicotine dependence: Secondary | ICD-10-CM | POA: Insufficient documentation

## 2014-04-11 DIAGNOSIS — R590 Localized enlarged lymph nodes: Secondary | ICD-10-CM | POA: Insufficient documentation

## 2014-04-11 DIAGNOSIS — I10 Essential (primary) hypertension: Secondary | ICD-10-CM | POA: Insufficient documentation

## 2014-04-11 DIAGNOSIS — F419 Anxiety disorder, unspecified: Secondary | ICD-10-CM | POA: Insufficient documentation

## 2014-04-11 DIAGNOSIS — E119 Type 2 diabetes mellitus without complications: Secondary | ICD-10-CM | POA: Insufficient documentation

## 2014-04-11 DIAGNOSIS — E049 Nontoxic goiter, unspecified: Secondary | ICD-10-CM | POA: Insufficient documentation

## 2014-04-11 DIAGNOSIS — Z7982 Long term (current) use of aspirin: Secondary | ICD-10-CM | POA: Insufficient documentation

## 2014-04-11 DIAGNOSIS — F329 Major depressive disorder, single episode, unspecified: Secondary | ICD-10-CM | POA: Insufficient documentation

## 2014-04-11 DIAGNOSIS — J449 Chronic obstructive pulmonary disease, unspecified: Secondary | ICD-10-CM | POA: Insufficient documentation

## 2014-04-11 DIAGNOSIS — C73 Malignant neoplasm of thyroid gland: Secondary | ICD-10-CM | POA: Diagnosis not present

## 2014-04-11 DIAGNOSIS — C7931 Secondary malignant neoplasm of brain: Secondary | ICD-10-CM | POA: Insufficient documentation

## 2014-04-11 DIAGNOSIS — Z880 Allergy status to penicillin: Secondary | ICD-10-CM | POA: Insufficient documentation

## 2014-04-11 DIAGNOSIS — R1310 Dysphagia, unspecified: Secondary | ICD-10-CM | POA: Insufficient documentation

## 2014-04-11 DIAGNOSIS — M797 Fibromyalgia: Secondary | ICD-10-CM | POA: Insufficient documentation

## 2014-04-11 DIAGNOSIS — K219 Gastro-esophageal reflux disease without esophagitis: Secondary | ICD-10-CM | POA: Insufficient documentation

## 2014-04-11 HISTORY — PX: VIDEO BRONCHOSCOPY WITH ENDOBRONCHIAL ULTRASOUND: SHX6177

## 2014-04-11 LAB — GLUCOSE, CAPILLARY
Glucose-Capillary: 101 mg/dL — ABNORMAL HIGH (ref 70–99)
Glucose-Capillary: 91 mg/dL (ref 70–99)

## 2014-04-11 SURGERY — BRONCHOSCOPY, WITH EBUS
Anesthesia: General

## 2014-04-11 MED ORDER — ONDANSETRON HCL 4 MG/2ML IJ SOLN
INTRAMUSCULAR | Status: DC | PRN
  Administered 2014-04-11: 4 mg via INTRAVENOUS

## 2014-04-11 MED ORDER — HYDROMORPHONE HCL 1 MG/ML IJ SOLN
0.2500 mg | INTRAMUSCULAR | Status: DC | PRN
  Administered 2014-04-11: 0.5 mg via INTRAVENOUS

## 2014-04-11 MED ORDER — NEOSTIGMINE METHYLSULFATE 10 MG/10ML IV SOLN
INTRAVENOUS | Status: DC | PRN
  Administered 2014-04-11: 4 mg via INTRAVENOUS

## 2014-04-11 MED ORDER — EPINEPHRINE HCL 1 MG/ML IJ SOLN
INTRAMUSCULAR | Status: AC
  Filled 2014-04-11: qty 1

## 2014-04-11 MED ORDER — PROPOFOL 10 MG/ML IV BOLUS
INTRAVENOUS | Status: AC
  Filled 2014-04-11: qty 20

## 2014-04-11 MED ORDER — LIDOCAINE HCL 4 % MT SOLN
OROMUCOSAL | Status: DC | PRN
  Administered 2014-04-11: 4 mL via TOPICAL

## 2014-04-11 MED ORDER — MIDAZOLAM HCL 2 MG/2ML IJ SOLN
INTRAMUSCULAR | Status: AC
  Filled 2014-04-11: qty 2

## 2014-04-11 MED ORDER — LACTATED RINGERS IV SOLN
INTRAVENOUS | Status: DC | PRN
  Administered 2014-04-11: 07:00:00 via INTRAVENOUS

## 2014-04-11 MED ORDER — FENTANYL CITRATE 0.05 MG/ML IJ SOLN
INTRAMUSCULAR | Status: AC
  Filled 2014-04-11: qty 5

## 2014-04-11 MED ORDER — EPHEDRINE SULFATE 50 MG/ML IJ SOLN
INTRAMUSCULAR | Status: DC | PRN
  Administered 2014-04-11 (×3): 10 mg via INTRAVENOUS
  Administered 2014-04-11: 20 mg via INTRAVENOUS

## 2014-04-11 MED ORDER — GLYCOPYRROLATE 0.2 MG/ML IJ SOLN
INTRAMUSCULAR | Status: DC | PRN
  Administered 2014-04-11: .6 mg via INTRAVENOUS

## 2014-04-11 MED ORDER — SCOPOLAMINE 1 MG/3DAYS TD PT72
MEDICATED_PATCH | TRANSDERMAL | Status: DC | PRN
  Administered 2014-04-11: 1 via TRANSDERMAL

## 2014-04-11 MED ORDER — FENTANYL CITRATE 0.05 MG/ML IJ SOLN
INTRAMUSCULAR | Status: DC | PRN
  Administered 2014-04-11: 150 ug via INTRAVENOUS

## 2014-04-11 MED ORDER — SCOPOLAMINE 1 MG/3DAYS TD PT72
MEDICATED_PATCH | TRANSDERMAL | Status: AC
  Filled 2014-04-11: qty 1

## 2014-04-11 MED ORDER — HYDROMORPHONE HCL 1 MG/ML IJ SOLN
INTRAMUSCULAR | Status: AC
  Filled 2014-04-11: qty 1

## 2014-04-11 MED ORDER — PROPOFOL 10 MG/ML IV BOLUS
INTRAVENOUS | Status: DC | PRN
  Administered 2014-04-11: 200 mg via INTRAVENOUS

## 2014-04-11 MED ORDER — ROCURONIUM BROMIDE 100 MG/10ML IV SOLN
INTRAVENOUS | Status: DC | PRN
  Administered 2014-04-11: 50 mg via INTRAVENOUS

## 2014-04-11 MED ORDER — 0.9 % SODIUM CHLORIDE (POUR BTL) OPTIME
TOPICAL | Status: DC | PRN
  Administered 2014-04-11: 1000 mL

## 2014-04-11 MED ORDER — PROMETHAZINE HCL 25 MG/ML IJ SOLN
6.2500 mg | INTRAMUSCULAR | Status: DC | PRN
Start: 2014-04-11 — End: 2014-04-11

## 2014-04-11 MED ORDER — MIDAZOLAM HCL 5 MG/5ML IJ SOLN
INTRAMUSCULAR | Status: DC | PRN
  Administered 2014-04-11 (×2): 2 mg via INTRAVENOUS

## 2014-04-11 SURGICAL SUPPLY — 33 items
BALL CTTN LRG ABS STRL LF (GAUZE/BANDAGES/DRESSINGS)
BRUSH CYTOL CELLEBRITY 1.5X140 (MISCELLANEOUS) IMPLANT
CANISTER SUCTION 2500CC (MISCELLANEOUS) ×3 IMPLANT
CONT SPEC 4OZ CLIKSEAL STRL BL (MISCELLANEOUS) ×5 IMPLANT
COTTONBALL LRG STERILE PKG (GAUZE/BANDAGES/DRESSINGS) IMPLANT
COVER TABLE BACK 60X90 (DRAPES) ×3 IMPLANT
FILTER STRAW FLUID ASPIR (MISCELLANEOUS) IMPLANT
FORCEPS BIOP RJ4 1.8 (CUTTING FORCEPS) IMPLANT
GAUZE SPONGE 4X4 12PLY STRL (GAUZE/BANDAGES/DRESSINGS) IMPLANT
GLOVE SURG SIGNA 7.5 PF LTX (GLOVE) ×3 IMPLANT
GOWN STRL REUS W/ TWL XL LVL3 (GOWN DISPOSABLE) ×1 IMPLANT
GOWN STRL REUS W/TWL XL LVL3 (GOWN DISPOSABLE) ×3
KIT CLEAN ENDO COMPLIANCE (KITS) ×3 IMPLANT
KIT ROOM TURNOVER OR (KITS) ×3 IMPLANT
MARKER SKIN DUAL TIP RULER LAB (MISCELLANEOUS) ×3 IMPLANT
NDL BIOPSY TRANSBRONCH 21G (NEEDLE) IMPLANT
NDL BLUNT 18X1 FOR OR ONLY (NEEDLE) IMPLANT
NEEDLE 22X1 1/2 (OR ONLY) (NEEDLE) IMPLANT
NEEDLE BIOPSY TRANSBRONCH 21G (NEEDLE) IMPLANT
NEEDLE BLUNT 18X1 FOR OR ONLY (NEEDLE) IMPLANT
NEEDLE SYS SONOTIP II EBUSTBNA (NEEDLE) ×5 IMPLANT
NS IRRIG 1000ML POUR BTL (IV SOLUTION) ×3 IMPLANT
OIL SILICONE PENTAX (PARTS (SERVICE/REPAIRS)) IMPLANT
PAD ARMBOARD 7.5X6 YLW CONV (MISCELLANEOUS) ×6 IMPLANT
SYR 20CC LL (SYRINGE) ×3 IMPLANT
SYR 20ML ECCENTRIC (SYRINGE) ×3 IMPLANT
SYR 5ML LL (SYRINGE) ×1 IMPLANT
SYR 5ML LUER SLIP (SYRINGE) ×1 IMPLANT
SYR CONTROL 10ML LL (SYRINGE) IMPLANT
TOWEL OR 17X24 6PK STRL BLUE (TOWEL DISPOSABLE) ×3 IMPLANT
TRAP SPECIMEN MUCOUS 40CC (MISCELLANEOUS) ×3 IMPLANT
TUBE CONNECTING 20'X1/4 (TUBING) ×1
TUBE CONNECTING 20X1/4 (TUBING) ×2 IMPLANT

## 2014-04-11 NOTE — Interval H&P Note (Signed)
History and Physical Interval Note:  04/11/2014 8:01 AM  Haley Lowe  has presented today for surgery, with the diagnosis of  LEFT HILAR ADENOPATHY  The various methods of treatment have been discussed with the patient and family. After consideration of risks, benefits and other options for treatment, the patient has consented to  Procedure(s): [place] (N/A) as a surgical intervention .  The patient's history has been reviewed, patient examined, no change in status, stable for surgery.  I have reviewed the patient's chart and labs.  Questions were answered to the patient's satisfaction.     Kerina Simoneau C

## 2014-04-11 NOTE — Anesthesia Postprocedure Evaluation (Signed)
Anesthesia Post Note  Patient: Haley Lowe  Procedure(s) Performed: Procedure(s) (LRB): VIDEO BRONCHOSCOPY WITH ENDOBRONCHIAL ULTRASOUND (N/A)  Anesthesia type: general  Patient location: PACU  Post pain: Pain level controlled  Post assessment: Patient's Cardiovascular Status Stable  Last Vitals:  Filed Vitals:   04/11/14 1000  BP: 88/58  Pulse: 74  Temp:   Resp: 17    Post vital signs: Reviewed and stable  Level of consciousness: sedated  Complications: No apparent anesthesia complications

## 2014-04-11 NOTE — Brief Op Note (Signed)
04/11/2014  9:21 AM  PATIENT:  Haley Lowe  59 y.o. female  PRE-OPERATIVE DIAGNOSIS:   LEFT HILAR ADENOPATHY  POST-OPERATIVE DIAGNOSIS:  LEFT HILAR ADENOPATHY  PROCEDURE:  Procedure(s): VIDEO BRONCHOSCOPY WITH ENDOBRONCHIAL ULTRASOUND (N/A)  SURGEON:  Surgeon(s) and Role:    * Melrose Nakayama, MD - Primary  ANESTHESIA:   general  EBL:     BLOOD ADMINISTERED:none  DRAINS: none   LOCAL MEDICATIONS USED:  NONE  SPECIMEN:  Source of Specimen:  left hilar lymph nodes  DISPOSITION OF SPECIMEN:  PATHOLOGY  PLAN OF CARE: Discharge to home after PACU  PATIENT DISPOSITION:  PACU - hemodynamically stable.   Delay start of Pharmacological VTE agent (>24hrs) due to surgical blood loss or risk of bleeding: not applicable  Findings: No endobronchial lesions seen. Hilar nodes difficult to access. No tumor seen on quick prep

## 2014-04-11 NOTE — Transfer of Care (Signed)
Immediate Anesthesia Transfer of Care Note  Patient: Haley Lowe  Procedure(s) Performed: Procedure(s): VIDEO BRONCHOSCOPY WITH ENDOBRONCHIAL ULTRASOUND (N/A)  Patient Location: PACU  Anesthesia Type:General  Level of Consciousness: awake, alert  and oriented  Airway & Oxygen Therapy: Patient Spontanous Breathing and Patient connected to nasal cannula oxygen  Post-op Assessment: Report given to PACU RN and Post -op Vital signs reviewed and stable  Post vital signs: Reviewed and stable  Complications: No apparent anesthesia complications

## 2014-04-11 NOTE — Anesthesia Preprocedure Evaluation (Signed)
Anesthesia Evaluation  Patient identified by MRN, date of birth, ID band Patient awake    Reviewed: Allergy & Precautions, NPO status , Patient's Chart, lab work & pertinent test results  History of Anesthesia Complications (+) PONV and history of anesthetic complications  Airway Mallampati: II  TM Distance: >3 FB Neck ROM: Full    Dental  (+) Teeth Intact, Dental Advisory Given   Pulmonary COPD COPD inhaler, former smoker,    Pulmonary exam normal       Cardiovascular hypertension, Pt. on medications and Pt. on home beta blockers     Neuro/Psych  Headaches, PSYCHIATRIC DISORDERS Anxiety Depression    GI/Hepatic Neg liver ROS, GERD-  ,  Endo/Other  diabetes  Renal/GU      Musculoskeletal  (+) Arthritis -,   Abdominal   Peds  Hematology   Anesthesia Other Findings   Reproductive/Obstetrics                             Anesthesia Physical Anesthesia Plan  ASA: III  Anesthesia Plan: General   Post-op Pain Management:    Induction: Intravenous  Airway Management Planned: Oral ETT  Additional Equipment:   Intra-op Plan:   Post-operative Plan: Extubation in OR  Informed Consent: I have reviewed the patients History and Physical, chart, labs and discussed the procedure including the risks, benefits and alternatives for the proposed anesthesia with the patient or authorized representative who has indicated his/her understanding and acceptance.   Dental advisory given  Plan Discussed with: CRNA, Anesthesiologist and Surgeon  Anesthesia Plan Comments:         Anesthesia Quick Evaluation

## 2014-04-11 NOTE — Discharge Instructions (Addendum)
Do not drive or engage in heavy physical activity for 24 hours  You may resume normal activities tomorrow  You may cough up small amounts of blood over the next few days  You may have a sore throat. You may use over-the-counter pain medication such as acetaminophen or ibuprofen as needed.  Call [phone] if you cough up more than a tablespoon of blood   Follow up with Dr. Barnet Glasgow  What to eat:  For your first meals, you should eat lightly; only small meals initially.  If you do not have nausea, you may eat larger meals.  Avoid spicy, greasy and heavy food.    General Anesthesia, Adult, Care After  Refer to this sheet in the next few weeks. These instructions provide you with information on caring for yourself after your procedure. Your health care provider may also give you more specific instructions. Your treatment has been planned according to current medical practices, but problems sometimes occur. Call your health care provider if you have any problems or questions after your procedure.  WHAT TO EXPECT AFTER THE PROCEDURE  After the procedure, it is typical to experience:  Sleepiness.  Nausea and vomiting. HOME CARE INSTRUCTIONS  For the first 24 hours after general anesthesia:  Have a responsible person with you.  Do not drive a car. If you are alone, do not take public transportation.  Do not drink alcohol.  Do not take medicine that has not been prescribed by your health care provider.  Do not sign important papers or make important decisions.  You may resume a normal diet and activities as directed by your health care provider.  Change bandages (dressings) as directed.  If you have questions or problems that seem related to general anesthesia, call the hospital and ask for the anesthetist or anesthesiologist on call. SEEK MEDICAL CARE IF:  You have nausea and vomiting that continue the day after anesthesia.  You develop a rash. SEEK IMMEDIATE MEDICAL CARE IF:  You have  difficulty breathing.  You have chest pain.  You have any allergic problems. Document Released: 06/28/2000 Document Revised: 11/22/2012 Document Reviewed: 10/05/2012  [facility] Patient Information 2014 [place], [place].

## 2014-04-11 NOTE — H&P (View-Only) (Signed)
PCP is [name], MD Referring Provider is Farrel Gobble, MD  Chief Complaint  Patient presents with  . 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  Haley Lowe is a 59 year old woman who in 2013 was found to have a brain mass. She had a resection and it was felt to be metastatic adenocarcinoma from unknown primary. More recently she had a primary papillary thyroid carcinoma. She had a thyroidectomy and neck dissection. She then had a second radical neck dissection for removal of additional lymph nodes.  She had a chest CT on December 16 which showed an increase in size of left hilar lymph nodes in comparison to a CT in May.  She says that she is still recovering from her neck surgery. She does have some difficulty swallowing. She has a lot of swelling in the neck and face. She denies any shortness of breath. She has an occasional cough. She says her weight has been relatively stable and her appetite remains good.   Past Medical History  Diagnosis Date  . Hypertension   . PONV (postoperative nausea and vomiting)   . Anxiety   . Depression   . COPD (chronic obstructive pulmonary disease)   . GERD (gastroesophageal reflux disease)   . Headache(784.0)   . Arthritis   . Adenocarcinoma 09/28/2011  . Hx of adenomatous colonic polyps   . Pulmonary nodules   . Diabetes mellitus   . Emphysema   . 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 Allergy     hips and shoulders  . Cataract     1 lens implaqnt right eye,intact on left eye  . Goiter     nodule watching  . Fibromyalgia     neuropathy tops of both feet  . Metastasis to brain 09/15/11     left frontoparietal region  . History of radiation therapy 10/06/11    1 fraction  15 gray  . Pulmonary  emboli 11/04/11    right upper lobe and r lower lobe PE  . Lung nodule seen on imaging study 11/04/11 CT    6mm LLL  . DVT (deep venous thrombosis) 12/09/2011  . Pulmonary embolism 12/09/2011  . Hyperlipidemia   . History of radiation therapy 10/06/11    SRS 15Gy 62fx, brain  . Metastatic adenocarcinoma to brain, unknown primary   . Thyroid cancer 2015  . UTI (lower urinary tract infection)     Past Surgical History  Procedure Laterality Date  . Abdominal hysterectomy  1996  . Cholecystectomy  2000  . Shoulder surgery  1998    right  . Eye surgery  2001    vision correction  . Foot surgery  2005    left  . Craniotomy  09/15/2011    Procedure: CRANIOTOMY TUMOR EXCISION;  Surgeon: Hosie Spangle, MD;  Location: [facility];  Service: Neurosurgery;  Laterality: N/A;  Craniotomy resection of tumor with stealth  . Cataract extraction  2011    with lens implant  . Total thyroidectomy  10-29-2013    [facility]  . Lymph gland excision      Family History  Problem Relation Age of Onset  . Asthma Mother   . Kidney failure Father   . Diabetes Sister   .  Heart attack Sister   . Colon cancer Brother   . COPD Sister   . Aneurysm Paternal Grandmother     brain  . Parkinsonism Maternal Uncle   . COPD Brother     Social History History  Substance Use Topics  . Smoking status: Former Smoker -- 1.00 packs/day for 40 years    Types: Cigarettes    Quit date: 04/06/2011  . Smokeless tobacco: Never Used  . Alcohol Use: No    Current Outpatient Prescriptions  Medication Sig Dispense Refill  . ALPRAZolam (XANAX) 0.5 MG tablet Take 0.5 mg by mouth 3 (three) times daily as needed for anxiety.     Marland Kitchen aspirin 81 MG chewable tablet Chew 81 mg by mouth daily.    . cetirizine (ZYRTEC) 10 MG tablet Take 10 mg by mouth daily.    . Cholecalciferol (VITAMIN D-3 PO) Take 2,000 Units by mouth daily.    . furosemide (LASIX) 40 MG tablet Take 40 mg by mouth. Takes every other  day    . HYDROmorphone (DILAUDID) 4 MG tablet Take one tablet up to every 3 hours as needed for breakthrough pain. 100 tablet 0  . ibuprofen (ADVIL,MOTRIN) 800 MG tablet Take 800 mg by mouth every 8 (eight) hours as needed for pain.     Marland Kitchen levothyroxine (SYNTHROID, LEVOTHROID) 125 MCG tablet Take 125 mcg by mouth daily before breakfast.    . metoprolol tartrate (LOPRESSOR) 25 MG tablet Take 25 mg by mouth 2 (two) times daily.    Marland Kitchen olmesartan-hydrochlorothiazide (BENICAR HCT) 20-12.5 MG per tablet Take 1 tablet by mouth daily. Takes 1/4 tablet daily    . polyethylene glycol (MIRALAX / GLYCOLAX) packet Take 17 g by mouth daily. For constipation    . ranitidine (ZANTAC) 150 MG tablet Take 150 mg by mouth 2 (two) times daily.    . simvastatin (ZOCOR) 10 MG tablet Take 10 mg by mouth at bedtime.    Marland Kitchen zolpidem (AMBIEN) 10 MG tablet Take 10 mg by mouth at bedtime as needed for sleep.     No current facility-administered medications for this visit.    Allergies  Allergen Reactions  . Penicillins Swelling    " my brain swelled, and mymouth"  . Codeine Itching    Mild takes benadryl  . Tape Rash    Review of Systems  Constitutional: Positive for activity change. Negative for fever, chills, appetite change and unexpected weight change.  HENT: Positive for trouble swallowing.   Respiratory: Negative for shortness of breath.   Gastrointestinal: Positive for abdominal pain.  Endocrine:       Thyroidectomy  All other systems reviewed and are negative.   BP 106/67 mmHg  Pulse 60  Resp 20  Ht 5\' 3"  (1.6 m)  Wt 176 lb (79.833 kg)  BMI 31.18 kg/m2  SpO2 95% Physical Exam  Constitutional: She is oriented to person, place, and time. She appears well-developed and well-nourished. No distress.  HENT:  Head: Normocephalic and atraumatic.  Eyes: EOM are normal. Pupils are equal, round, and reactive to light.  Neck:  Well healed surgical scar   Cardiovascular: Normal rate, regular rhythm and  normal heart sounds.   No murmur heard. Pulmonary/Chest: Effort normal and breath sounds normal. She has no wheezes. She has no rales.  Abdominal: Soft. There is no tenderness.  Musculoskeletal: She exhibits no edema.  Lymphadenopathy:    She has no cervical adenopathy.  Neurological: She is alert and oriented to person, place, and time. No  cranial nerve deficit.  Skin: Skin is warm and dry.  Vitals reviewed.    Diagnostic Tests: ADDENDUM REPORT: 03/21/2014 14:11  ADDENDUM: Clarification is made to the body of the report of the chest CT of 03/20/2014.  Sentence in the first paragraph of the body of the report should be corrected to state:  Progressive LEFT hilar adenopathy, with index LEFT hilar lymph node 12 mm short axis series 2, image 31, previously 3 mm (not 3.2 cm as previously reported).  Discussed with Kirby Crigler PA on 03/21/2014 at 1405 hours.   Electronically Signed  By: Lavonia Dana M.D.  On: 03/21/2014 14:11      Study Result     CLINICAL DATA: Primary papillary thyroid cancer. Followup hilar adenopathy.  EXAM: CT CHEST WITH CONTRAST  TECHNIQUE: Multidetector CT imaging of the chest was performed during intravenous contrast administration.  CONTRAST: 47mL OMNIPAQUE IOHEXOL 300 MG/ML SOLN  COMPARISON: 08/06/2013 and PET-CT from 12/24/2013.  FINDINGS: Mediastinum: The heart size appears normal. No pericardial effusion. Calcified atherosclerotic disease involves the thoracic aorta. Progressive left hilar adenopathy. Index lymph node on the left measures 1.2 cm, image 31/series 2. Previously 3.2 cm. Left hilar lymph node measures 1 cm, image 35/series 2. New from previous exam.  Lungs/Pleura: No pleural effusion. Moderate, upper lobe predominant changes of centrilobular emphysema identified. There is a nodule in the left lower lobe which measures 3 mm, image 49/series 3. This is unchanged from the previous exam. Stable right  upper lobe nodule measuring 3 mm, image 24/series 3.  Upper Abdomen: New retrocrural and upper abdominal adenopathy identified. At the level of the celiac trunk there is a right-sided periaortic lymph node measuring 2 cm, image 65/series 2. Left periaortic lymph node measures 1.4 cm, image 70/series 2. Left retrocrural lymph node measures 1.4 cm, image 57/series 2. Mild hepatic steatosis noted. The patient is status post cholecystectomy. The visualized portions of the pancreas and spleen are unremarkable. The adrenal glands are both normal.  Musculoskeletal: Review of the visualized osseous structures is significant for mild spondylosis within the thoracic spine. There are no aggressive lytic or sclerotic bone lesions.  IMPRESSION: 1. No acute cardiopulmonary abnormalities. 2. Progressive adenopathy within the chest and upper abdomen which is worrisome for metastatic adenopathy. 3. Small nonspecific nodules in the lungs are unchanged from previous exam 4. Emphysema.  Electronically Signed: By: Kerby Moors M.D. 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 These nodes are not massively enlarged but are definitely larger than they were back in May of this year. Dr. Barnet Glasgow wishes to have the nodes sampled to see if they are related to her papillary thyroid carcinoma or possibly an occult lung primary.  I described the proposed procedure, bronchoscopy and endobronchial ultrasound, with Mr. and Mrs. Haley Lowe. We discussed the indications, risks, benefits, and alternatives. They understand that this is an endoscopic procedure done on an outpatient basis. We will do it in the operating room with general anesthesia. They do understand that the success rate with endobronchial ultrasound  aspirations is in the 85% range. The understand the risks include, but are not limited to those associated with general anesthesia, as well as bleeding, pneumothorax, failure to make a diagnosis, as well as the possibility of unforeseeable, occasions. She is anxious but accepts the risk and wishes to proceed  Plan: Bronchoscopy and endobronchial ultrasound on Thursday, January 7.

## 2014-04-12 ENCOUNTER — Encounter (HOSPITAL_COMMUNITY): Payer: Self-pay | Admitting: Thoracic Surgery (Cardiothoracic Vascular Surgery)

## 2014-04-13 NOTE — Op Note (Signed)
NAME:  Haley Lowe, Haley Lowe                ACCOUNT NO.:  [id number]  MEDICAL RECORD NO.:  [id number]  LOCATION:  SDS                          FACILITY:  [facility]  PHYSICIAN:  Revonda Standard. Roxan Hockey, M.D.DATE OF BIRTH:  [birth year]  DATE OF PROCEDURE:  04/10/2014 DATE OF DISCHARGE:  04/10/2014                              OPERATIVE REPORT   PREOPERATIVE DIAGNOSIS:  Left hilar adenopathy.  POSTOPERATIVE DIAGNOSIS:  Left hilar adenopathy.  PROCEDURE:  Videobronchoscopy and endobronchial ultrasound with left hilar lymph node aspirations.  SURGEON:  Revonda Standard. Roxan Hockey, M.D.  ANESTHESIA:  General.  FINDINGS:  No endobronchial lesions seen.  Some diffuse edema of the bronchial mucosa.  Hilar nodes were difficult to access due to poor windows.  Two separate nodes were aspirated and specimen sent together.  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  A recent CT showed left hilar adenopathy and these had increased in size between May and December 2015.  Her oncologist felt that these could be related to her metastatic adenocarcinoma of the brain and wished to have biopsies done. The indications, risks, benefits, and alternatives were discussed in detail with the patient and her husband.  They understood the nature of the procedure and the limitations of the procedure as well as the possibility of false and negative aspirations.  She understood and accepted the risks and agreed to proceed.  OPERATIVE NOTE:  Haley Lowe was brought to the operating room on April 10, 2014.  She had induction of general anesthesia and was intubated. Flexible fiberoptic bronchoscopy was performed via the endotracheal tube.  It revealed normal endobronchial anatomy.  There were no endobronchial lesions, although there was diffuse edema in the bronchial mucosa bilaterally.  The  bronchoscope was withdrawn.  The endobronchial ultrasound probe then was advanced and inspection revealed no enlarged mediastinal nodes.  The scope was advanced to the bifurcation of the left upper and lower lobe bronchi and there was a node in this area; however, it was difficult to achieve a window due to surrounding blood vessels.  There also was a node that was prominent along the superolateral aspect of the left mainstem bronchus near the takeoff of the left upper lobe bronchus.  Aspirations were performed to both of these nodes and again, this was difficult in both cases due to proximity of vessels but with each aspiration, the needle was advanced into the lymph node with ultrasound visualization and then 10 to 12 passes were made within the node with suction applied.  The initial aspirations were performed with portion of the specimen being placed on the slides and the remainder being placed into satellite for cell block. Five aspirations were performed and then these specimens were sent for quick prep of the slides.  While awaiting those results, five additional aspirations were performed with all of the aspirated material being placed in the satellite for permanent pathology.  The quick prep of the slides subsequently returned showing scant lymphoid cells, no tumor was seen on the initial preparation.  Additional aspirations were performed and added  to the satellite container and then this was sent for permanent pathology.  The endobronchial ultrasound probe was withdrawn. The videobronchoscope was reinserted.  There was no bleeding of note within the airways.  The bronchoscope was withdrawn.  The patient was then extubated in the operating room and taken to the postanesthetic care unit in good condition.     Revonda Standard Roxan Hockey, M.D.   Due to computer issues unable to edit this note initially. Note copied and pasted and edited in the new form.   SCH/MEDQ  D:   04/12/2014  T:  04/13/2014  Job:  [id number]

## 2014-04-16 ENCOUNTER — Telehealth: Payer: Self-pay | Admitting: Thoracic Surgery (Cardiothoracic Vascular Surgery)

## 2014-04-16 NOTE — Telephone Encounter (Signed)
Attempted to call with path results  Left message on machine

## 2014-04-17 ENCOUNTER — Telehealth: Payer: Self-pay | Admitting: Thoracic Surgery (Cardiothoracic Vascular Surgery)

## 2014-04-17 NOTE — Telephone Encounter (Signed)
Informed patient of EBUS results- cytology + for adenocarcinoma  Limited specimen but pathologist favored lung or upper GI primary over thyroid  She will follow up with Oncology

## 2014-04-18 NOTE — Op Note (Signed)
Haley Nakayama, MD Physician Unsigned Transcription  Op Note 04/13/2014 1:25 AM  Related encounter: Pre-Admission Testing 60 from 04/10/2014 in [place]    Expand All Collapse All   NAME: Haley Haley Lowe, Haley Lowe ACCOUNT NO.: [id number]  MEDICAL RECORD NO.: [id number]  LOCATION: SDS FACILITY: [facility]  PHYSICIAN: Haley Standard. Haley Haley Lowe, M.D.DATE OF BIRTH: Apr 02, 1956  DATE OF PROCEDURE: 04/10/2014 DATE OF DISCHARGE: 04/10/2014   OPERATIVE REPORT   PREOPERATIVE DIAGNOSIS: Left hilar adenopathy.  POSTOPERATIVE DIAGNOSIS: Left hilar adenopathy.  PROCEDURE: Videobronchoscopy and endobronchial ultrasound with left hilar lymph node aspirations.  SURGEON: Haley Standard. Haley Haley Lowe, M.D.  ANESTHESIA: General.  FINDINGS: No endobronchial lesions seen. Some diffuse edema of the bronchial mucosa. Hilar nodes were difficult to access due to poor windows. Two separate nodes were aspirated and specimens sent together.  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 She also has a history of papillary thyroid cancer. A recent CT showed left hilar adenopathy and these nodes had increased in size between May and December 2015. Her oncologist felt that these could be related to her metastatic adenocarcinoma of the brain and wished to have biopsies done. The indications, risks, benefits, and alternatives were discussed in detail with the patient and her husband. They understood the nature of the procedure and the limitations of the procedure as well as the possibility of false negative aspirations. She understood and accepted the risks and agreed to proceed.  OPERATIVE NOTE: Haley Haley Lowe was brought to the operating room on April 10, 2014. She had induction of general anesthesia and was  intubated. Flexible fiberoptic bronchoscopy was performed via the endotracheal tube. It revealed normal endobronchial anatomy. There were no endobronchial lesions, although there was diffuse edema in the bronchial mucosa bilaterally. The bronchoscope was withdrawn.  The endobronchial ultrasound probe then was advanced and inspection revealed no enlarged mediastinal nodes. The scope was advanced to the bifurcation of the left upper and lower lobe bronchi and there was a relatively large node in this area; however, it was difficult to achieve a window due to surrounding blood vessels. There also was a node that was prominent along the superolateral aspect of the left mainstem bronchus near the takeoff of the left upper lobe bronchus. Aspirations were performed to both of these nodes. This was difficult in both cases due to proximity of vessels but with each aspiration, the needle was advanced into the lymph node with ultrasound visualization and then 10 to 12 passes were made within the node with suction applied. The initial aspirations were performed with portion of the specimen being placed on the slides and the remainder being placed into Cytolyte for cell block. Five aspirations were performed and then these specimens were sent for quick prep of the slides. While awaiting those results, five additional aspirations were performed with all of the aspirated material being placed in the satellite for permanent pathology. The quick prep of the slides subsequently returned showing scant lymphoid cells, no tumor was seen on the initial preparation. Additional aspirations were performed and added to the Cytolyte container and then this was sent for permanent pathology. The endobronchial ultrasound probe was withdrawn. The videobronchoscope was reinserted. There was no bleeding of note within the airways. The bronchoscope was withdrawn. The patient was then extubated in the  operating room and taken to the postanesthetic care unit in good condition.     Haley Haley Lowe, M.D.  SCH/MEDQ D: 04/12/2014 T: 04/13/2014 Job: [id number]     Haley Nakayama, MD Physician Unsigned Transcription  Op Note 04/13/2014 1:25 AM  Related encounter: Pre-Admission Testing 60 from 04/10/2014 in [place]    Expand All Collapse All   NAME: Haley, Haley Lowe ACCOUNT NO.: [id number]  MEDICAL RECORD NO.: [id number]  LOCATION: SDS FACILITY: [facility]  PHYSICIAN: Haley Standard. Haley Haley Lowe, M.D.DATE OF BIRTH: 02-06-1956  DATE OF PROCEDURE: 04/10/2014 DATE OF DISCHARGE: 04/10/2014   OPERATIVE REPORT   PREOPERATIVE DIAGNOSIS: Left hilar adenopathy.  POSTOPERATIVE DIAGNOSIS: Left hilar adenopathy.  PROCEDURE: Videobronchoscopy and endobronchial ultrasound with left hilar lymph node aspirations.  SURGEON: Haley Standard. Haley Haley Lowe, M.D.  ANESTHESIA: General.  FINDINGS: No endobronchial lesions seen. Some diffuse edema of the bronchial mucosa. Hilar nodes were difficult to access due to poor windows. Two separate nodes were aspirated and specimen sent together.  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 A recent CT showed left hilar adenopathy and these had increased in size between May and December 2015. Her oncologist felt that these could be related to her metastatic adenocarcinoma of the brain and wished to have biopsies done. The indications, risks, benefits, and alternatives were discussed in detail with the patient and her husband. They understood the nature of the procedure and the limitations of the procedure as well as the possibility of false and negative aspirations. She  understood and accepted the risks and agreed to proceed.  OPERATIVE NOTE: Haley Haley Lowe was brought to the operating room on April 10, 2014. She had induction of general anesthesia and was intubated. Flexible fiberoptic bronchoscopy was performed via the endotracheal tube. It revealed normal endobronchial anatomy. There were no endobronchial lesions, although there was diffuse edema in the bronchial mucosa bilaterally. The bronchoscope was withdrawn.  The endobronchial ultrasound probe then was advanced and inspection revealed no enlarged mediastinal nodes. The scope was advanced to the bifurcation of the left upper and lower lobe bronchi and there was a node in this area; however, it was difficult to achieve a window due to surrounding blood vessels. There also was a node that was prominent along the superolateral aspect of the left mainstem bronchus near the takeoff of the left upper lobe bronchus. Aspirations were performed to both of these nodes and again, this was difficult in both cases due to proximity of vessels but with each aspiration, the needle was advanced into the lymph node with ultrasound visualization and then 10 to 12 passes were made within the node with suction applied. The initial aspirations were performed with portion of the specimen being placed on the slides and the remainder being placed into satellite for cell block. Five aspirations were performed and then these specimens were sent for quick prep of the slides. While awaiting those results, five additional aspirations were performed with all of the aspirated material being placed in the satellite for permanent pathology. The quick prep of the slides subsequently returned showing scant lymphoid cells, no tumor was seen on the initial preparation. Additional aspirations were performed and added to the satellite container and then this was sent for permanent pathology. The endobronchial ultrasound  probe was withdrawn. The videobronchoscope was reinserted. There was no bleeding of note within the airways. The bronchoscope was withdrawn. The patient was then extubated in the operating room and taken to the postanesthetic care unit in  good condition.     Haley Haley Lowe, M.D.     SCH/MEDQ D: 04/12/2014 T: 04/13/2014 Job: [id number]

## 2014-04-24 ENCOUNTER — Telehealth (HOSPITAL_COMMUNITY): Payer: Self-pay | Admitting: *Deleted

## 2014-04-25 ENCOUNTER — Ambulatory Visit (HOSPITAL_COMMUNITY): Payer: Medicare Other | Admitting: Hematology & Oncology

## 2014-05-04 ENCOUNTER — Emergency Department (HOSPITAL_COMMUNITY): Payer: Medicare Other

## 2014-05-04 ENCOUNTER — Emergency Department (HOSPITAL_COMMUNITY)
Admission: EM | Admit: 2014-05-04 | Discharge: 2014-05-04 | Disposition: A | Discharge status: Home / Self Care | Payer: Medicare Other | Source: Home / Self Care | Attending: Emergency Medicine | Admitting: Emergency Medicine

## 2014-05-04 ENCOUNTER — Encounter (HOSPITAL_COMMUNITY): Payer: Self-pay | Admitting: Emergency Medicine

## 2014-05-04 DIAGNOSIS — Z87440 Personal history of urinary (tract) infections: Secondary | ICD-10-CM | POA: Diagnosis not present

## 2014-05-04 DIAGNOSIS — Z79899 Other long term (current) drug therapy: Secondary | ICD-10-CM | POA: Insufficient documentation

## 2014-05-04 DIAGNOSIS — M79602 Pain in left arm: Secondary | ICD-10-CM

## 2014-05-04 DIAGNOSIS — Z9849 Cataract extraction status, unspecified eye: Secondary | ICD-10-CM | POA: Diagnosis not present

## 2014-05-04 DIAGNOSIS — K219 Gastro-esophageal reflux disease without esophagitis: Secondary | ICD-10-CM | POA: Diagnosis not present

## 2014-05-04 DIAGNOSIS — E119 Type 2 diabetes mellitus without complications: Secondary | ICD-10-CM

## 2014-05-04 DIAGNOSIS — J029 Acute pharyngitis, unspecified: Secondary | ICD-10-CM

## 2014-05-04 DIAGNOSIS — J449 Chronic obstructive pulmonary disease, unspecified: Secondary | ICD-10-CM

## 2014-05-04 DIAGNOSIS — Z85841 Personal history of malignant neoplasm of brain: Secondary | ICD-10-CM

## 2014-05-04 DIAGNOSIS — Z86718 Personal history of other venous thrombosis and embolism: Secondary | ICD-10-CM

## 2014-05-04 DIAGNOSIS — E785 Hyperlipidemia, unspecified: Secondary | ICD-10-CM

## 2014-05-04 DIAGNOSIS — Z86711 Personal history of pulmonary embolism: Secondary | ICD-10-CM

## 2014-05-04 DIAGNOSIS — F419 Anxiety disorder, unspecified: Secondary | ICD-10-CM

## 2014-05-04 DIAGNOSIS — Z923 Personal history of irradiation: Secondary | ICD-10-CM | POA: Diagnosis not present

## 2014-05-04 DIAGNOSIS — R001 Bradycardia, unspecified: Secondary | ICD-10-CM | POA: Insufficient documentation

## 2014-05-04 DIAGNOSIS — Z880 Allergy status to penicillin: Secondary | ICD-10-CM

## 2014-05-04 DIAGNOSIS — Z86010 Personal history of colonic polyps: Secondary | ICD-10-CM | POA: Insufficient documentation

## 2014-05-04 DIAGNOSIS — Z7982 Long term (current) use of aspirin: Secondary | ICD-10-CM | POA: Insufficient documentation

## 2014-05-04 DIAGNOSIS — I10 Essential (primary) hypertension: Secondary | ICD-10-CM | POA: Diagnosis not present

## 2014-05-04 DIAGNOSIS — M797 Fibromyalgia: Secondary | ICD-10-CM

## 2014-05-04 DIAGNOSIS — Z87891 Personal history of nicotine dependence: Secondary | ICD-10-CM | POA: Diagnosis not present

## 2014-05-04 DIAGNOSIS — M1990 Unspecified osteoarthritis, unspecified site: Secondary | ICD-10-CM

## 2014-05-04 DIAGNOSIS — Z85850 Personal history of malignant neoplasm of thyroid: Secondary | ICD-10-CM | POA: Insufficient documentation

## 2014-05-04 DIAGNOSIS — R200 Anesthesia of skin: Secondary | ICD-10-CM | POA: Diagnosis present

## 2014-05-04 DIAGNOSIS — F329 Major depressive disorder, single episode, unspecified: Secondary | ICD-10-CM | POA: Diagnosis not present

## 2014-05-04 LAB — CBC WITH DIFFERENTIAL/PLATELET
Basophils Absolute: 0.1 10*3/uL (ref 0.0–0.1)
Basophils Relative: 1 % (ref 0–1)
EOS PCT: 2 % (ref 0–5)
Eosinophils Absolute: 0.1 10*3/uL (ref 0.0–0.7)
HCT: 41.4 % (ref 36.0–46.0)
Hemoglobin: 13.4 g/dL (ref 12.0–15.0)
Lymphocytes Relative: 15 % (ref 12–46)
Lymphs Abs: 0.8 10*3/uL (ref 0.7–4.0)
MCH: 28.3 pg (ref 26.0–34.0)
MCHC: 32.4 g/dL (ref 30.0–36.0)
MCV: 87.5 fL (ref 78.0–100.0)
Monocytes Absolute: 0.5 10*3/uL (ref 0.1–1.0)
Monocytes Relative: 9 % (ref 3–12)
NEUTROS PCT: 73 % (ref 43–77)
Neutro Abs: 3.9 10*3/uL (ref 1.7–7.7)
PLATELETS: 240 10*3/uL (ref 150–400)
RBC: 4.73 MIL/uL (ref 3.87–5.11)
RDW: 13.1 % (ref 11.5–15.5)
WBC: 5.3 10*3/uL (ref 4.0–10.5)

## 2014-05-04 LAB — BASIC METABOLIC PANEL
Anion gap: 8 (ref 5–15)
BUN: 11 mg/dL (ref 6–23)
CALCIUM: 9.5 mg/dL (ref 8.4–10.5)
CHLORIDE: 100 mmol/L (ref 96–112)
CO2: 29 mmol/L (ref 19–32)
Creatinine, Ser: 0.78 mg/dL (ref 0.50–1.10)
GFR calc non Af Amer: >90 mL/min (ref >90)
GLUCOSE: 125 mg/dL — AB (ref 70–99)
Potassium: 3.7 mmol/L (ref 3.5–5.1)
SODIUM: 137 mmol/L (ref 135–145)

## 2014-05-04 LAB — TROPONIN I

## 2014-05-04 MED ORDER — OXYCODONE-ACETAMINOPHEN 5-325 MG PO TABS: 1.0000 | ORAL_TABLET | ORAL | Status: DC | PRN

## 2014-05-04 NOTE — ED Notes (Signed)
Pt's baseline HR is in the 50's due to medication, per pt and daughter.

## 2014-05-04 NOTE — Discharge Instructions (Signed)
Your CT today does not show evidence of a stroke, your blood work and EKG are normal. We will have you return tomorrow for an ultrasound of the left arm to make sure there is no blood clot. Take the pain medication as directed. Return as needed.

## 2014-05-04 NOTE — ED Notes (Addendum)
Pt reports left arm numbness/pain since 630 p.m. last night. Pt denies cp,sob, changes in vision,weakness,n/v/d. Equal grips noted. No facial asymmetry. Speech clear.

## 2014-05-04 NOTE — ED Provider Notes (Signed)
CSN: [id number]     Arrival date & time 05/04/14  0940 History   First MD Initiated Contact with Patient 05/04/14 [phone]     Chief Complaint  Patient presents with  . Numbness     (Consider location/radiation/quality/duration/timing/severity/associated sxs/prior Treatment) The history is provided by the patient.   Haley Lowe is a 59 y.o. female who presents to the ED with left arm numbness and pain that started last night. She felt like it was muscle spasm because she had been using her left hand to squeeze a rag several times before the symptoms started. After she was up all night with the pain she decided it may be something different and needed to have it evaluated. She denies any other problems. Multiple medical problems as stated below. PCP is Dr. Gerarda Fraction. She is [facility] by [facility] for her cancer treatment. Patient reports that last night just before the pain her arm started she had a sudden onset of feeling hot, started sweating. Then she had cramping in the left hand followed by pain going up the arm.   Past Medical History  Diagnosis Date  . Hypertension   . PONV (postoperative nausea and vomiting)   . Anxiety   . Depression   . COPD (chronic obstructive pulmonary disease)   . GERD (gastroesophageal reflux disease)   . Headache(784.0)   . Arthritis   . Hx of adenomatous colonic polyps   . Pulmonary nodules   . Emphysema   . Allergy     hips and shoulders  . Cataract     1 lens implaqnt right eye,intact on left eye  . Goiter     nodule watching  . Fibromyalgia     neuropathy tops of both feet  . History of radiation therapy 10/06/11    1 fraction  15 gray  . Pulmonary emboli 11/04/11    right upper lobe and r lower lobe PE  . Lung nodule seen on imaging study 11/04/11 CT    76mm LLL  . DVT (deep venous thrombosis) 12/09/2011  . Pulmonary embolism 12/09/2011  . Hyperlipidemia   . History of radiation therapy 10/06/11    SRS 15Gy 43fx, brain  . UTI (lower urinary tract  infection)   . Diabetes mellitus     borderline  . Adenocarcinoma 09/28/2011  . 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 Metastasis to brain 09/15/11     left frontoparietal region  . Metastatic adenocarcinoma to brain, unknown primary   . Thyroid cancer 2015   Past Surgical History  Procedure Laterality Date  . Abdominal hysterectomy  1996  . Cholecystectomy  2000  . Shoulder surgery  1998    right  . Eye surgery  2001    vision correction  . Foot surgery  2005    left  . Craniotomy  09/15/2011    Procedure: CRANIOTOMY TUMOR EXCISION;  Surgeon: Hosie Spangle, MD;  Location: [facility];  Service: Neurosurgery;  Laterality: N/A;  Craniotomy resection of tumor with stealth  . Cataract extraction  2011    with lens implant  . Total thyroidectomy  10-29-2013    [facility]  . Lymph gland excision    . Video bronchoscopy with endobronchial ultrasound N/A 04/11/2014    Procedure: VIDEO BRONCHOSCOPY WITH ENDOBRONCHIAL ULTRASOUND;  Surgeon: Melrose Nakayama, MD;  Location: [facility] OR;  Service: Thoracic;  Laterality: N/A;   Family History  Problem Relation Age of  Onset  . Asthma Mother   . Kidney failure Father   . Diabetes Sister   . Heart attack Sister   . Colon cancer Brother   . COPD Sister   . Aneurysm Paternal Grandmother     brain  . Parkinsonism Maternal Uncle   . COPD Brother    History  Substance Use Topics  . Smoking status: Former Smoker -- 1.00 packs/day for 40 years    Types: Cigarettes    Quit date: 04/06/2011  . Smokeless tobacco: Never Used  . Alcohol Use: No   OB History    No data available     Review of Systems  Constitutional: Negative for fever and chills.  HENT: Positive for sore throat.   Eyes: Negative for pain, redness and visual disturbance.  Respiratory: Negative for shortness of breath.   Cardiovascular: Negative for chest pain, palpitations and leg swelling.  Gastrointestinal:  Negative for nausea, vomiting, abdominal pain, diarrhea and constipation.  Genitourinary: Negative for dysuria, urgency and frequency.  Musculoskeletal:       Left arm pain  Skin: Negative for rash.  Neurological: Negative for syncope, weakness and headaches.  Psychiatric/Behavioral: Negative for confusion. The patient is not nervous/anxious.       Allergies  Penicillins; Codeine; and Tape  Home Medications   Prior to Admission medications   Medication Sig Start Date End Date Taking? Authorizing Provider  ALPRAZolam Duanne Moron) 0.5 MG tablet Take 0.5 mg by mouth 3 (three) times daily as needed for anxiety.  01/04/12  Yes Historical Provider, MD  aspirin 81 MG chewable tablet Chew 81 mg by mouth daily.   Yes Historical Provider, MD  cetirizine (ZYRTEC) 10 MG tablet Take 10 mg by mouth daily.   Yes Historical Provider, MD  Cholecalciferol (VITAMIN D-3 PO) Take 2,000 Units by mouth daily.   Yes Historical Provider, MD  docusate sodium (COLACE) 100 MG capsule Take 100 mg by mouth 2 (two) times daily.   Yes Historical Provider, MD  esomeprazole (NEXIUM) 20 MG capsule Take 20 mg by mouth daily at 12 noon.   Yes Historical Provider, MD  ibuprofen (ADVIL,MOTRIN) 800 MG tablet Take 400-800 mg by mouth every 8 (eight) hours as needed for pain.    Yes Historical Provider, MD  levothyroxine (SYNTHROID, LEVOTHROID) 150 MCG tablet Take 150 mcg by mouth daily before breakfast.   Yes Historical Provider, MD  metoprolol tartrate (LOPRESSOR) 25 MG tablet Take 25 mg by mouth 2 (two) times daily.   Yes Historical Provider, MD  olmesartan-hydrochlorothiazide (BENICAR HCT) 20-12.5 MG per tablet Take 0.25 tablets by mouth daily. Takes 1/4 tablet daily   Yes Historical Provider, MD  polyethylene glycol (MIRALAX / GLYCOLAX) packet Take 17 g by mouth daily. For constipation   Yes Historical Provider, MD  Potassium 99 MG TABS Take 1 tablet by mouth daily.   Yes Historical Provider, MD  simvastatin (ZOCOR) 20 MG tablet  Take 20 mg by mouth daily.   Yes Historical Provider, MD  fentaNYL (DURAGESIC - DOSED MCG/HR) 50 MCG/HR Place 1 patch (50 mcg total) onto the skin every 3 (three) days. 04/08/14   Baird Cancer, PA-C  ondansetron (ZOFRAN ODT) 8 MG disintegrating tablet Take 1 tablet (8 mg total) by mouth every 8 (eight) hours as needed for nausea or vomiting. 04/05/14   Shaune Pollack, MD  oxyCODONE-acetaminophen (ROXICET) 5-325 MG per tablet Take 1 tablet by mouth every 4 (four) hours as needed for severe pain. 05/04/14   [place],  NP   BP 123/73 mmHg  Pulse 48  Temp(Src) 98 F (36.7 C) (Oral)  Resp 10  Ht 5\' 3"  (1.6 m)  Wt 177 lb (80.287 kg)  BMI 31.36 kg/m2  SpO2 97% Physical Exam  Constitutional: She is oriented to person, place, and time. She appears well-developed and well-nourished. No distress.  HENT:  Head: Normocephalic and atraumatic.  Eyes: Conjunctivae and EOM are normal.  Neck: Neck supple. Normal carotid pulses and no JVD present. Carotid bruit is not present.  Scar from thyroid surgery.   Cardiovascular: Regular rhythm.  Bradycardia present.   Pulmonary/Chest: Effort normal.  Abdominal: Soft. There is no tenderness.  Musculoskeletal: Normal range of motion. She exhibits no edema.       Left forearm: She exhibits tenderness. She exhibits no deformity and no laceration. Swelling: minimal.  Left forearm tender with deep palpation with pain radiating to the upper arm.   Neurological: She is alert and oriented to person, place, and time. No cranial nerve deficit.  Skin: Skin is warm and dry.  Psychiatric: She has a normal mood and affect. Her behavior is normal.  Nursing note and vitals reviewed.   ED Course  Procedures (including critical care time) Labs Review Ct Head Wo Contrast  05/04/2014   CLINICAL DATA:  59 year old female with numbness and pain in the left arm since 6:30 p.m. yesterday evening. Metastatic adenocarcinoma to the brain status post craniotomy on 09/15/2013, and  radiation therapy to the brain. History of thyroid cancer.  EXAM: CT HEAD WITHOUT CONTRAST  TECHNIQUE: Contiguous axial images were obtained from the base of the skull through the vertex without intravenous contrast.  COMPARISON:  Brain MRI 12/28/2013.  FINDINGS: Status post left frontal craniotomy. No acute intracranial abnormalities. Specifically, no evidence of acute intracranial hemorrhage, no definite findings of acute/subacute cerebral ischemia, no mass, mass effect, hydrocephalus or abnormal intra or extra-axial fluid collections. Visualized paranasal sinuses and mastoids are well pneumatized. No acute displaced skull fractures are identified.  IMPRESSION: 1. No acute findings. 2. Status post left frontal craniotomy. No findings to suggest recurrent tumor on today's non contrast CT examination.   Electronically Signed   By: Vinnie Langton M.D.   On: 05/04/2014 11:07    Results for orders placed or performed during the hospital encounter of 05/04/14 (from the past 24 hour(s))  CBC with Differential     Status: None   Collection Time: 05/04/14 10:21 AM  Result Value Ref Range   WBC 5.3 4.0 - 10.5 K/uL   RBC 4.73 3.87 - 5.11 MIL/uL   Hemoglobin 13.4 12.0 - 15.0 g/dL   HCT 41.4 36.0 - 46.0 %   MCV 87.5 78.0 - 100.0 fL   MCH 28.3 26.0 - 34.0 pg   MCHC 32.4 30.0 - 36.0 g/dL   RDW 13.1 11.5 - 15.5 %   Platelets 240 150 - 400 K/uL   Neutrophils Relative % 73 43 - 77 %   Neutro Abs 3.9 1.7 - 7.7 K/uL   Lymphocytes Relative 15 12 - 46 %   Lymphs Abs 0.8 0.7 - 4.0 K/uL   Monocytes Relative 9 3 - 12 %   Monocytes Absolute 0.5 0.1 - 1.0 K/uL   Eosinophils Relative 2 0 - 5 %   Eosinophils Absolute 0.1 0.0 - 0.7 K/uL   Basophils Relative 1 0 - 1 %   Basophils Absolute 0.1 0.0 - 0.1 K/uL  Troponin I     Status: None   Collection Time: 05/04/14  10:21 AM  Result Value Ref Range   Troponin I <0.03 <0.031 ng/mL  Basic metabolic panel     Status: Abnormal   Collection Time: 05/04/14 10:21 AM   Result Value Ref Range   Sodium 137 135 - 145 mmol/L   Potassium 3.7 3.5 - 5.1 mmol/L   Chloride 100 96 - 112 mmol/L   CO2 29 19 - 32 mmol/L   Glucose, Bld 125 (H) 70 - 99 mg/dL   BUN 11 6 - 23 mg/dL   Creatinine, Ser 0.78 0.50 - 1.10 mg/dL   Calcium 9.5 8.4 - 10.5 mg/dL   GFR calc non Af Amer >90 >90 mL/min   GFR calc Af Amer >90 >90 mL/min   Anion gap 8 5 - 15    EKG Interpretation  Date/Time:  Saturday May 04 2014 09:48:56 EST Ventricular Rate:  56 PR Interval:  176 QRS Duration: 87 QT Interval:  420 QTC Calculation: 405 R Axis:   34 Text Interpretation:  Sinus rhythm Borderline low voltage, extremity leads Confirmed by ZAMMIT  MD, JOSEPH ([id number]) on 05/04/2014 9:51:53 AM  MDM  Dr. Roderic Palau in to evaluate the patient and discuss findings and plan of care. Will have patient return in the am for ultrasound of left upper extremity r/o DVT. Patient states she has plenty of pain medication at home. She voices understanding about follow up. Her daughter is with her. Stable for d/c without signs of CVA or MI. I have reviewed this patient's vital signs, nurses notes, appropriate labs and imaging.   Final diagnoses:  Left arm pain      Ashley Murrain, NP 05/05/14 [phone]

## 2014-05-04 NOTE — ED Notes (Signed)
Hope, NP at bedside.

## 2014-05-04 NOTE — ED Notes (Signed)
Dr Zammit at bedside. 

## 2014-05-05 ENCOUNTER — Ambulatory Visit (HOSPITAL_COMMUNITY)
Admission: EM | Admit: 2014-05-05 | Discharge: 2014-05-05 | Disposition: A | Discharge status: Home / Self Care | Payer: Medicare Other | Attending: Emergency Medicine | Admitting: Emergency Medicine

## 2014-05-05 NOTE — ED Provider Notes (Signed)
11:00 AM- Pt returns to the ED today for follow up from yesterday when she was seen for left arm numbness and pain that started two nights ago. She was instructed to return today for a doppler study that she received with negative results for any evidence of DVT. Discussed with the patient results of doppler study and plan of care. She states she will follow up with her PCP, Dr. Gerarda Fraction tomorrow.   [place] Grant, NP 05/05/14 [place], MD 05/07/14 [phone]

## 2014-05-06 ENCOUNTER — Other Ambulatory Visit (HOSPITAL_COMMUNITY): Payer: Medicare Other

## 2014-05-10 ENCOUNTER — Other Ambulatory Visit: Payer: Medicare Other

## 2014-05-13 ENCOUNTER — Ambulatory Visit: Payer: Medicare Other | Admitting: Radiation Oncology

## 2014-05-13 ENCOUNTER — Other Ambulatory Visit: Payer: Self-pay | Admitting: Radiation Therapy

## 2014-05-13 DIAGNOSIS — C7949 Secondary malignant neoplasm of other parts of nervous system: Principal | ICD-10-CM

## 2014-05-13 DIAGNOSIS — C7931 Secondary malignant neoplasm of brain: Secondary | ICD-10-CM

## 2014-05-15 ENCOUNTER — Ambulatory Visit
Admission: RE | Admit: 2014-05-15 | Discharge: 2014-05-15 | Disposition: A | Discharge status: Home / Self Care | Payer: Medicare Other | Source: Ambulatory Visit | Attending: Radiation Oncology | Admitting: Radiation Oncology

## 2014-05-15 DIAGNOSIS — C7931 Secondary malignant neoplasm of brain: Secondary | ICD-10-CM

## 2014-05-15 MED ORDER — GADOBENATE DIMEGLUMINE 529 MG/ML IV SOLN
15.0000 mL | Freq: Once | INTRAVENOUS | Status: AC | PRN
  Administered 2014-05-15: 15 mL via INTRAVENOUS

## 2014-05-16 ENCOUNTER — Other Ambulatory Visit (HOSPITAL_COMMUNITY): Payer: Self-pay | Admitting: Oncology

## 2014-05-17 ENCOUNTER — Other Ambulatory Visit: Payer: Medicare Other

## 2014-05-20 ENCOUNTER — Inpatient Hospital Stay: Admission: RE | Admit: 2014-05-20 | Payer: Medicare Other | Source: Ambulatory Visit | Admitting: Radiation Oncology

## 2014-05-20 ENCOUNTER — Ambulatory Visit: Payer: Medicare Other | Admitting: Radiation Oncology

## 2014-05-22 ENCOUNTER — Encounter: Payer: Self-pay | Admitting: Radiation Oncology

## 2014-05-22 ENCOUNTER — Ambulatory Visit
Admission: RE | Admit: 2014-05-22 | Discharge: 2014-05-22 | Disposition: A | Discharge status: Home / Self Care | Payer: Medicare Other | Source: Ambulatory Visit | Attending: Radiation Oncology | Admitting: Radiation Oncology

## 2014-05-22 VITALS — BP 138/70 | HR 67 | Temp 98.1°F | Resp 20 | Ht 63.0 in | Wt 170.4 lb

## 2014-05-22 DIAGNOSIS — C7931 Secondary malignant neoplasm of brain: Secondary | ICD-10-CM

## 2014-05-22 NOTE — Progress Notes (Signed)
Follow up s/p SRS brain 10/06/11 left frontal, MRI 05/15/14 results in, no headaches,nausea, dizzy ness, or pain, no difficulty swallowing, just tightness in neck where throid removed stated 2nd surgery  this past October 2015, at [facility] , [facility] outpt,  Appetite good, energy level so so stated 9:29 AM

## 2014-05-24 ENCOUNTER — Ambulatory Visit: Payer: Medicare Other | Admitting: Radiation Oncology

## 2014-05-24 ENCOUNTER — Encounter: Payer: Self-pay | Admitting: Radiation Oncology

## 2014-05-24 NOTE — Progress Notes (Signed)
Radiation Oncology         [phone] ________________________________  Name: Haley Lowe MRN: [id number]  Date: 05/22/2014  DOB: Apr 30, 1955  Follow-Up Visit Note  CC: [name], MD  [employer], MD  Diagnosis:    Metastatic adenocarcinoma with brain metastasis   Narrative:  The patient returns today for routine follow-up.  The patient states she feels that she is doing well overall. She is getting geared up for treatment at [facility] for thyroid cancer with systemic treatment. She notes no recent headaches. No nausea or dizziness. The patient had a recent MRI scan of the brain on 05/15/2014 and she comes in today to discuss this result.                              ALLERGIES:  is allergic to penicillins; codeine; and tape.  Meds: Current Outpatient Prescriptions  Medication Sig Dispense Refill  . ALPRAZolam (XANAX) 0.5 MG tablet Take 0.5 mg by mouth 3 (three) times daily as needed for anxiety.     Marland Kitchen aspirin 81 MG chewable tablet Chew 81 mg by mouth daily.    . cetirizine (ZYRTEC) 10 MG tablet Take 10 mg by mouth daily.    . Cholecalciferol (VITAMIN D-3 PO) Take 2,000 Units by mouth daily.    Marland Kitchen docusate sodium (COLACE) 100 MG capsule Take 100 mg by mouth 2 (two) times daily.    Marland Kitchen esomeprazole (NEXIUM) 20 MG capsule Take 20 mg by mouth daily at 12 noon.    . fentaNYL (DURAGESIC - DOSED MCG/HR) 50 MCG/HR Place 1 patch (50 mcg total) onto the skin every 3 (three) days. 10 patch 0  . ibuprofen (ADVIL,MOTRIN) 800 MG tablet Take 400-800 mg by mouth every 8 (eight) hours as needed for pain.     Marland Kitchen levothyroxine (SYNTHROID, LEVOTHROID) 150 MCG tablet Take 150 mcg by mouth daily before breakfast.    . metoprolol tartrate (LOPRESSOR) 25 MG tablet Take 25 mg by mouth 2 (two) times daily.    Marland Kitchen olmesartan-hydrochlorothiazide (BENICAR HCT) 20-12.5 MG per tablet Take 0.25 tablets by mouth daily. Takes 1/4 tablet daily    . ondansetron (ZOFRAN ODT) 8 MG disintegrating tablet Take 1  tablet (8 mg total) by mouth every 8 (eight) hours as needed for nausea or vomiting. 20 tablet 0  . oxyCODONE-acetaminophen (ROXICET) 5-325 MG per tablet Take 1 tablet by mouth every 4 (four) hours as needed for severe pain. 15 tablet 0  . polyethylene glycol (MIRALAX / GLYCOLAX) packet Take 17 g by mouth daily. For constipation    . Potassium 99 MG TABS Take 1 tablet by mouth daily.    . simvastatin (ZOCOR) 20 MG tablet Take 20 mg by mouth daily.     No current facility-administered medications for this encounter.    Physical Findings: The patient is in no acute distress. Patient is alert and oriented.  height is 5\' 3"  (1.6 m) and weight is 170 lb 6.4 oz (77.293 kg). Her oral temperature is 98.1 F (36.7 C). Her blood pressure is 138/70 and her pulse is 67. Her respiration is 20 and oxygen saturation is 98%. .     Lab Findings: Lab Results  Component Value Date   WBC 5.3 05/04/2014   HGB 13.4 05/04/2014   HCT 41.4 05/04/2014   MCV 87.5 05/04/2014   PLT 240 05/04/2014     Radiographic Findings: Ct Head Wo Contrast  05/04/2014  CLINICAL DATA:  59 year old female with numbness and pain in the left arm since 6:30 p.m. yesterday evening. Metastatic adenocarcinoma to the brain status post craniotomy on 09/15/2013, and radiation therapy to the brain. History of thyroid cancer.  EXAM: CT HEAD WITHOUT CONTRAST  TECHNIQUE: Contiguous axial images were obtained from the base of the skull through the vertex without intravenous contrast.  COMPARISON:  Brain MRI 12/28/2013.  FINDINGS: Status post left frontal craniotomy. No acute intracranial abnormalities. Specifically, no evidence of acute intracranial hemorrhage, no definite findings of acute/subacute cerebral ischemia, no mass, mass effect, hydrocephalus or abnormal intra or extra-axial fluid collections. Visualized paranasal sinuses and mastoids are well pneumatized. No acute displaced skull fractures are identified.  IMPRESSION: 1. No acute  findings. 2. Status post left frontal craniotomy. No findings to suggest recurrent tumor on today's non contrast CT examination.   Electronically Signed   By: Vinnie Langton M.D.   On: 05/04/2014 11:07   Mr [name] XT Contrast  05/15/2014   CLINICAL DATA:  Metastatic disease to brain unknown primary. History of thyroid cancer. History of craniotomy for tumor resection 09/23/2011. Radiation treatment July 2013  EXAM: MRI HEAD WITHOUT AND WITH CONTRAST  TECHNIQUE: Multiplanar, multiecho pulse sequences of the brain and surrounding structures were obtained without and with intravenous contrast.  CONTRAST:  15 mL MultiHance IV  COMPARISON:  MRI head 12/28/2013  FINDINGS: Postop left parietal convexity craniotomy. Mild encephalomalacia in the high left frontal lobe is stable and without evidence of recurrent tumor or edema.  Ventricle size is normal. Negative for acute infarct. Negative for hemorrhage.  Pituitary normal in size. Calvarium intact site from the craniotomy.  6 mm enhancing nodule in the superior left cerebellum just below the tentorium is a new finding and is therefore most consistent with metastatic disease. No significant surrounding edema. No other enhancing lesions identified.  IMPRESSION: New 6 mm lesion left superior cerebellum consistent with metastatic disease. No other enhancing lesions.  Stable postop changes left posterior frontal lobe without recurrence.   Electronically Signed   By: Franchot Gallo M.D.   On: 05/15/2014 13:48   US Venous Img Upper Uni Left  05/05/2014   CLINICAL DATA:  Left arm pain and swelling.  EXAM: LEFT UPPER EXTREMITY VENOUS DOPPLER ULTRASOUND  TECHNIQUE: Gray-scale sonography with graded compression, as well as color Doppler and duplex ultrasound were performed to evaluate the upper extremity deep venous system from the level of the subclavian vein and including the jugular, axillary, basilic, radial, ulnar and upper cephalic vein. Spectral Doppler was utilized to  evaluate flow at rest and with distal augmentation maneuvers.  COMPARISON:  None.  FINDINGS: Contralateral Subclavian Vein: Respiratory phasicity is normal and symmetric with the symptomatic side. No evidence of thrombus. Normal compressibility.  Internal Jugular Vein: No evidence of thrombus. Normal compressibility, respiratory phasicity and response to augmentation.  Subclavian Vein: No evidence of thrombus. Normal compressibility, respiratory phasicity and response to augmentation.  Axillary Vein: No evidence of thrombus. Normal compressibility, respiratory phasicity and response to augmentation.  Cephalic Vein: No evidence of thrombus. Normal compressibility, respiratory phasicity and response to augmentation.  Basilic Vein: No evidence of thrombus. Normal compressibility, respiratory phasicity and response to augmentation.  Brachial Veins: No evidence of thrombus. Normal compressibility, respiratory phasicity and response to augmentation.  Radial Veins: No evidence of thrombus. Normal compressibility, respiratory phasicity and response to augmentation.  Ulnar Veins: No evidence of thrombus. Normal compressibility, respiratory phasicity and response to augmentation.  Venous Reflux:  None visualized.  Other Findings:  None visualized.  IMPRESSION: No evidence of deep venous thrombosis.   Electronically Signed   By: Lajean Manes M.D.   On: 05/05/2014 10:58    Impression:    The patient clinically is doing fairly well. However, she was found to have a new 6 mm brain metastasis within the left cerebellum. This was discussed in multidisciplinary brain conference and she is felt to be a good candidate for radiosurgery.  Plan:  I discussed this result with the patient and all of her questions were answered. She is interested in proceeding with radiosurgery. She will be scheduled for simulation as soon as possible within the next week and we will proceed with treatment. This should not interfere with her systemic  treatment which is coming up.  I spent 15 minutes with the patient today, the majority of which was spent counseling the patient on the diagnosis of cancer and coordinating care.   Jodelle Gross, M.D., Ph.D.

## 2014-05-27 ENCOUNTER — Ambulatory Visit
Admission: RE | Admit: 2014-05-27 | Discharge: 2014-05-27 | Disposition: A | Discharge status: Home / Self Care | Payer: Medicare Other | Source: Ambulatory Visit | Attending: Radiation Oncology | Admitting: Radiation Oncology

## 2014-05-27 DIAGNOSIS — C7931 Secondary malignant neoplasm of brain: Secondary | ICD-10-CM | POA: Insufficient documentation

## 2014-05-28 ENCOUNTER — Other Ambulatory Visit: Payer: Medicare Other

## 2014-05-28 NOTE — Progress Notes (Signed)
  Radiation Oncology         [phone] ________________________________  Name: Haley Lowe MRN: [id number]  Date: 05/27/2014  DOB: 27-Aug-1955  SIMULATION AND TREATMENT PLANNING NOTE  DIAGNOSIS:   Metastatic adenocarcinoma with brain metastasis   NARRATIVE:  The patient was brought to the [place] suite.  Identity was confirmed.  All relevant records and images related to the planned course of therapy were reviewed.  The patient freely provided informed written consent to proceed with treatment after reviewing the details related to the planned course of therapy. The consent form was witnessed and verified by the simulation staff. Intravenous access was established for contrast administration. Then, the patient was set-up in a stable reproducible supine position for radiation therapy.  A relocatable thermoplastic stereotactic head frame was fabricated for precise immobilization.  CT images were obtained.  Surface markings were placed.  The CT images were loaded into the planning software and fused with the patient's targeting MRI scan.  Then the target and avoidance structures were contoured.  Treatment planning then occurred.  The radiation prescription was entered and confirmed.  I have requested 3D planning  I have requested a DVH of the following structures: Brain stem, brain, left eye, right I, lenses, optic chiasm, target volumes, uninvolved brain, and normal tissue.    PLAN:  The patient will receive 20 Gy in 1 fraction.  ________________________________  Jodelle Gross, MD, PhD

## 2014-05-29 ENCOUNTER — Ambulatory Visit: Payer: Medicare Other | Admitting: Radiation Oncology

## 2014-05-29 DIAGNOSIS — C7931 Secondary malignant neoplasm of brain: Secondary | ICD-10-CM | POA: Diagnosis not present

## 2014-05-31 ENCOUNTER — Ambulatory Visit
Admission: RE | Admit: 2014-05-31 | Discharge: 2014-05-31 | Disposition: A | Discharge status: Home / Self Care | Payer: Medicare Other | Source: Ambulatory Visit | Attending: Radiation Oncology | Admitting: Radiation Oncology

## 2014-05-31 ENCOUNTER — Encounter: Payer: Self-pay | Admitting: Radiation Oncology

## 2014-05-31 DIAGNOSIS — C7931 Secondary malignant neoplasm of brain: Secondary | ICD-10-CM | POA: Diagnosis not present

## 2014-05-31 NOTE — Progress Notes (Signed)
[facility] tx Brain completed, patient ambulated  staeady gait to dressing room 1 at nursing, voiced no headaches, no vision changes, no dizzy ness or nausea, continue  Monitoring patient for 30 minutes,offered water, T>V remote and call bell within reach 1:51 PM

## 2014-05-31 NOTE — Op Note (Signed)
Stereotactic Radiosurgery Operative Note  Name: Haley Lowe MRN: [id number]  Date: 05/31/2014  DOB: [birth year]  Op Note  Pre Operative Diagnosis:  Brain metastasis  Post Operative Diagnois:  Brain metastasis  3D TREATMENT PLANNING AND DOSIMETRY:  The patient's radiation plan was reviewed and approved by myself (neurosurgery) and Dr. Kyung Rudd (radiation oncology) prior to treatment.  It showed 3-dimensional radiation distributions overlaid onto the planning CT/MRI image set.  [facility] for the target structures as well as the organs at risk were reviewed. The documentation of the 3D plan and dosimetry are filed in the radiation oncology EMR.  NARRATIVE:  Haley Lowe was brought to the TrueBeam stereotactic radiation treatment machine and placed supine on the CT couch. The head frame was applied, and the patient was set up for stereotactic radiosurgery.  I was present for the set-up and delivery.  SIMULATION VERIFICATION:  In the couch zero-angle position, the patient underwent Exactrac imaging using the Brainlab system with orthogonal KV images.  These were carefully aligned and repeated to confirm treatment position for each of the isocenters.  The Exactrac snap film verification was repeated at each couch angle.  SPECIAL TREATMENT PROCEDURE: Haley Lowe received stereotactic radiosurgery to the following targets: Left cerebellar target was treated using 3 Circular Arcs to a prescription dose of 20 Gy.  ExacTrac registration was performed for each couch angle.  The 79.2% isodose line was prescribed.  STEREOTACTIC TREATMENT MANAGEMENT:  Following delivery, the patient was transported to nursing in stable condition and monitored for possible acute effects.  Vital signs were recorded BP 106/67 mmHg  Pulse 59  Temp(Src) 97.7 F (36.5 C)  Resp 20. The patient tolerated treatment without significant acute effects, and was discharged to home in stable condition.    PLAN: Follow-up in one  month.

## 2014-05-31 NOTE — Progress Notes (Signed)
Patient's VS stable. She denies pain, HA, nausea, dizziness, vision changes. She is d/c home ambulatory with spouse and daughter.

## 2014-07-11 ENCOUNTER — Encounter: Payer: Self-pay | Admitting: Radiation Oncology

## 2014-07-15 ENCOUNTER — Encounter: Payer: Self-pay | Admitting: Radiation Oncology

## 2014-07-15 ENCOUNTER — Ambulatory Visit
Admission: RE | Admit: 2014-07-15 | Discharge: 2014-07-15 | Disposition: A | Discharge status: Home / Self Care | Payer: Medicare Other | Source: Ambulatory Visit | Attending: Radiation Oncology | Admitting: Radiation Oncology

## 2014-07-15 VITALS — BP 141/78 | HR 66 | Temp 97.6°F | Resp 20 | Ht 63.0 in | Wt 171.3 lb

## 2014-07-15 DIAGNOSIS — C7931 Secondary malignant neoplasm of brain: Secondary | ICD-10-CM

## 2014-07-15 HISTORY — DX: Personal history of irradiation: Z92.3

## 2014-07-15 NOTE — Progress Notes (Addendum)
Follow up [place] brain  tx 05/31/14, no nausea, no dizzyness, no vision changes noted, appetite fair, occasional difficulty swallowing foods if she hasn't massaged her neck, (fluids from lymphedema) , appetite so so, energy level also up and down, does c/o tightness in neck, Wednesday goes for tests BP 141/78 mmHg  Pulse 66  Temp(Src) 97.6 F (36.4 C) (Oral)  Resp 20  Ht 5\' 3"  (1.6 m)  Wt 171 lb 4.8 oz (77.701 kg)  BMI 30.35 kg/m2  SpO2 97% 9:28 AM

## 2014-07-15 NOTE — Progress Notes (Signed)
Radiation Oncology         [phone] ________________________________  Name: Haley Lowe MRN: [id number]  Date: 07/15/2014  DOB: April 08, 1955  Follow-Up Visit Note  CC: [name], MD  [employer], MD  Diagnosis:    Metastatic adenocarcinoma with brain metastasis   Narrative:  The patient returns today for routine follow-up. Follow up [place] brain  tx 05/31/14, no nausea, no dizzyness, no vision changes noted, appetite fair, occasional difficulty swallowing foods if she hasn't massaged her neck, (fluids from lymphedema) , appetite so-so, energy levels vary, does c/o tightness in neck. She is scheduled for tests on Wednesday. Reports some nausea and headaches, both of which she attributes to stress. BP 141/78 mmHg  Pulse 66  Temp(Src) 97.6 F (36.4 C) (Oral)  Resp 20  Ht 5\' 3"  (1.6 m)  Wt 171 lb 4.8 oz (77.701 kg)  BMI 30.35 kg/m2  SpO2 97% 9:41 AM                            ALLERGIES:  is allergic to penicillins; codeine; and tape.  Meds: Current Outpatient Prescriptions  Medication Sig Dispense Refill  . ALPRAZolam (XANAX) 0.5 MG tablet Take 0.5 mg by mouth 3 (three) times daily as needed for anxiety.     Marland Kitchen aspirin 81 MG chewable tablet Chew 81 mg by mouth daily.    . cetirizine (ZYRTEC) 10 MG tablet Take 10 mg by mouth daily.    . Cholecalciferol (VITAMIN D-3 PO) Take 2,000 Units by mouth daily.    Marland Kitchen docusate sodium (COLACE) 100 MG capsule Take 100 mg by mouth 2 (two) times daily.    . fentaNYL (DURAGESIC - DOSED MCG/HR) 50 MCG/HR Place 1 patch (50 mcg total) onto the skin every 3 (three) days. 10 patch 0  . ibuprofen (ADVIL,MOTRIN) 800 MG tablet Take 400-800 mg by mouth every 8 (eight) hours as needed for pain.     Marland Kitchen levothyroxine (SYNTHROID, LEVOTHROID) 150 MCG tablet Take 150 mcg by mouth daily before breakfast.    . metoprolol tartrate (LOPRESSOR) 25 MG tablet Take 25 mg by mouth 2 (two) times daily.    Marland Kitchen olmesartan-hydrochlorothiazide (BENICAR HCT) 20-12.5  MG per tablet Take 0.25 tablets by mouth daily. Takes 1/4 tablet daily    . ondansetron (ZOFRAN ODT) 8 MG disintegrating tablet Take 1 tablet (8 mg total) by mouth every 8 (eight) hours as needed for nausea or vomiting. 20 tablet 0  . polyethylene glycol (MIRALAX / GLYCOLAX) packet Take 17 g by mouth daily. For constipation    . simvastatin (ZOCOR) 20 MG tablet Take 20 mg by mouth daily.    Marland Kitchen esomeprazole (NEXIUM) 20 MG capsule Take 20 mg by mouth daily at 12 noon.    Marland Kitchen oxyCODONE-acetaminophen (ROXICET) 5-325 MG per tablet Take 1 tablet by mouth every 4 (four) hours as needed for severe pain. (Patient not taking: Reported on 07/15/2014) 15 tablet 0  . Potassium 99 MG TABS Take 1 tablet by mouth daily.     No current facility-administered medications for this encounter.    Physical Findings: The patient is in no acute distress. Patient is alert and oriented.  height is 5\' 3"  (1.6 m) and weight is 171 lb 4.8 oz (77.701 kg). Her oral temperature is 97.6 F (36.4 C). Her blood pressure is 141/78 and her pulse is 66. Her respiration is 20 and oxygen saturation is 97%. .     Lab Findings:  Lab Results  Component Value Date   WBC 5.3 05/04/2014   HGB 13.4 05/04/2014   HCT 41.4 05/04/2014   MCV 87.5 05/04/2014   PLT 240 05/04/2014     Radiographic Findings: No results found.  Impression: The patient clinically is doing fairly well.   Plan:  I discussed this result with the patient and all of her questions were answered. Brain MRI 2 months  I spent 15 minutes with the patient today, the majority of which was spent counseling the patient on the diagnosis of cancer and coordinating care.   This document serves as a record of services personally performed by Kyung Rudd, MD. It was created on his behalf by Pearlie Oyster, a trained medical scribe. The creation of this record is based on the scribe's personal observations and the provider's statements to them. This document has been checked  and approved by the attending provider.      Jodelle Gross, M.D., Ph.D.

## 2014-07-23 ENCOUNTER — Other Ambulatory Visit: Payer: Self-pay | Admitting: Radiation Therapy

## 2014-07-23 DIAGNOSIS — C7931 Secondary malignant neoplasm of brain: Secondary | ICD-10-CM

## 2014-07-28 ENCOUNTER — Encounter (HOSPITAL_COMMUNITY): Payer: Self-pay | Admitting: Emergency Medicine

## 2014-07-28 ENCOUNTER — Observation Stay (HOSPITAL_COMMUNITY)
Admission: EM | Admit: 2014-07-28 | Discharge: 2014-07-29 | Disposition: A | Discharge status: Home / Self Care | Payer: Medicare Other | Attending: Internal Medicine | Admitting: Internal Medicine

## 2014-07-28 ENCOUNTER — Emergency Department (HOSPITAL_COMMUNITY): Payer: Medicare Other

## 2014-07-28 DIAGNOSIS — F419 Anxiety disorder, unspecified: Secondary | ICD-10-CM | POA: Diagnosis not present

## 2014-07-28 DIAGNOSIS — Z86711 Personal history of pulmonary embolism: Secondary | ICD-10-CM | POA: Insufficient documentation

## 2014-07-28 DIAGNOSIS — E785 Hyperlipidemia, unspecified: Secondary | ICD-10-CM | POA: Insufficient documentation

## 2014-07-28 DIAGNOSIS — K219 Gastro-esophageal reflux disease without esophagitis: Secondary | ICD-10-CM | POA: Diagnosis not present

## 2014-07-28 DIAGNOSIS — R0789 Other chest pain: Secondary | ICD-10-CM | POA: Diagnosis present

## 2014-07-28 DIAGNOSIS — R079 Chest pain, unspecified: Secondary | ICD-10-CM | POA: Diagnosis present

## 2014-07-28 DIAGNOSIS — M1990 Unspecified osteoarthritis, unspecified site: Secondary | ICD-10-CM | POA: Insufficient documentation

## 2014-07-28 DIAGNOSIS — I10 Essential (primary) hypertension: Secondary | ICD-10-CM | POA: Insufficient documentation

## 2014-07-28 DIAGNOSIS — Z85850 Personal history of malignant neoplasm of thyroid: Secondary | ICD-10-CM | POA: Insufficient documentation

## 2014-07-28 DIAGNOSIS — Z87891 Personal history of nicotine dependence: Secondary | ICD-10-CM | POA: Insufficient documentation

## 2014-07-28 DIAGNOSIS — J4489 Other specified chronic obstructive pulmonary disease: Secondary | ICD-10-CM | POA: Diagnosis present

## 2014-07-28 DIAGNOSIS — Z86718 Personal history of other venous thrombosis and embolism: Secondary | ICD-10-CM | POA: Diagnosis not present

## 2014-07-28 DIAGNOSIS — Z923 Personal history of irradiation: Secondary | ICD-10-CM | POA: Insufficient documentation

## 2014-07-28 DIAGNOSIS — Z86010 Personal history of colonic polyps: Secondary | ICD-10-CM | POA: Diagnosis not present

## 2014-07-28 DIAGNOSIS — Z79899 Other long term (current) drug therapy: Secondary | ICD-10-CM | POA: Diagnosis not present

## 2014-07-28 DIAGNOSIS — C73 Malignant neoplasm of thyroid gland: Secondary | ICD-10-CM | POA: Diagnosis not present

## 2014-07-28 DIAGNOSIS — E049 Nontoxic goiter, unspecified: Secondary | ICD-10-CM | POA: Insufficient documentation

## 2014-07-28 DIAGNOSIS — F329 Major depressive disorder, single episode, unspecified: Secondary | ICD-10-CM | POA: Insufficient documentation

## 2014-07-28 DIAGNOSIS — J449 Chronic obstructive pulmonary disease, unspecified: Secondary | ICD-10-CM | POA: Insufficient documentation

## 2014-07-28 DIAGNOSIS — C7931 Secondary malignant neoplasm of brain: Secondary | ICD-10-CM | POA: Insufficient documentation

## 2014-07-28 DIAGNOSIS — G43909 Migraine, unspecified, not intractable, without status migrainosus: Secondary | ICD-10-CM | POA: Diagnosis not present

## 2014-07-28 DIAGNOSIS — Z87440 Personal history of urinary (tract) infections: Secondary | ICD-10-CM | POA: Diagnosis not present

## 2014-07-28 DIAGNOSIS — Z7982 Long term (current) use of aspirin: Secondary | ICD-10-CM | POA: Diagnosis not present

## 2014-07-28 DIAGNOSIS — Z880 Allergy status to penicillin: Secondary | ICD-10-CM | POA: Insufficient documentation

## 2014-07-28 HISTORY — DX: Essential (primary) hypertension: I10

## 2014-07-28 HISTORY — DX: Prediabetes: R73.03

## 2014-07-28 LAB — PROTIME-INR
INR: 0.91 (ref 0.00–1.49)
Prothrombin Time: 12.4 seconds (ref 11.6–15.2)

## 2014-07-28 LAB — COMPREHENSIVE METABOLIC PANEL
ALK PHOS: 111 U/L (ref 39–117)
ALT: 20 U/L (ref 0–35)
AST: 26 U/L (ref 0–37)
Albumin: 4.5 g/dL (ref 3.5–5.2)
Anion gap: 9 (ref 5–15)
BILIRUBIN TOTAL: 0.8 mg/dL (ref 0.3–1.2)
BUN: 15 mg/dL (ref 6–23)
CALCIUM: 10.1 mg/dL (ref 8.4–10.5)
CO2: 33 mmol/L — ABNORMAL HIGH (ref 19–32)
CREATININE: 0.7 mg/dL (ref 0.50–1.10)
Chloride: 87 mmol/L — ABNORMAL LOW (ref 96–112)
GFR calc non Af Amer: >90 mL/min (ref >90)
Glucose, Bld: 113 mg/dL — ABNORMAL HIGH (ref 70–99)
POTASSIUM: 3.4 mmol/L — AB (ref 3.5–5.1)
Sodium: 129 mmol/L — ABNORMAL LOW (ref 135–145)
Total Protein: 7.8 g/dL (ref 6.0–8.3)

## 2014-07-28 LAB — URINALYSIS, ROUTINE W REFLEX MICROSCOPIC
BILIRUBIN URINE: NEGATIVE
Glucose, UA: NEGATIVE mg/dL
Ketones, ur: NEGATIVE mg/dL
Leukocytes, UA: NEGATIVE
NITRITE: NEGATIVE
PH: 6.5 (ref 5.0–8.0)
SPECIFIC GRAVITY, URINE: 1.015 (ref 1.005–1.030)
Urobilinogen, UA: 0.2 mg/dL (ref 0.0–1.0)

## 2014-07-28 LAB — URINE MICROSCOPIC-ADD ON

## 2014-07-28 LAB — CBC
HEMATOCRIT: 45.4 % (ref 36.0–46.0)
Hemoglobin: 16.1 g/dL — ABNORMAL HIGH (ref 12.0–15.0)
MCH: 29.5 pg (ref 26.0–34.0)
MCHC: 35.5 g/dL (ref 30.0–36.0)
MCV: 83.2 fL (ref 78.0–100.0)
PLATELETS: 149 10*3/uL — AB (ref 150–400)
RBC: 5.46 MIL/uL — ABNORMAL HIGH (ref 3.87–5.11)
RDW: 14.3 % (ref 11.5–15.5)
WBC: 5.8 10*3/uL (ref 4.0–10.5)

## 2014-07-28 LAB — I-STAT TROPONIN, ED: Troponin i, poc: 0.01 ng/mL (ref 0.00–0.08)

## 2014-07-28 LAB — TSH: TSH: 5.115 u[IU]/mL — ABNORMAL HIGH (ref 0.350–4.500)

## 2014-07-28 LAB — APTT: APTT: 30 s (ref 24–37)

## 2014-07-28 LAB — TROPONIN I: Troponin I: <0.03 ng/mL (ref <0.031)

## 2014-07-28 MED ORDER — NITROGLYCERIN 0.4 MG SL SUBL: 0.4000 mg | SUBLINGUAL_TABLET | SUBLINGUAL | Status: DC | PRN

## 2014-07-28 MED ORDER — POTASSIUM 99 MG PO TABS: 1.0000 | ORAL_TABLET | Freq: Every day | ORAL | Status: DC

## 2014-07-28 MED ORDER — ACETAMINOPHEN 325 MG PO TABS: 650.0000 mg | ORAL_TABLET | ORAL | Status: DC | PRN

## 2014-07-28 MED ORDER — VITAMIN D 1000 UNITS PO TABS
1000.0000 [IU] | ORAL_TABLET | Freq: Every day | ORAL | Status: DC
  Administered 2014-07-29: 1000 [IU] via ORAL
  Filled 2014-07-28 (×4): qty 1

## 2014-07-28 MED ORDER — HYDROCHLOROTHIAZIDE 25 MG PO TABS
25.0000 mg | ORAL_TABLET | Freq: Every day | ORAL | Status: DC
  Administered 2014-07-29: 25 mg via ORAL
  Filled 2014-07-28 (×3): qty 1

## 2014-07-28 MED ORDER — FENTANYL 50 MCG/HR TD PT72
50.0000 ug | MEDICATED_PATCH | TRANSDERMAL | Status: DC
  Administered 2014-07-28: 50 ug via TRANSDERMAL
  Filled 2014-07-28: qty 1

## 2014-07-28 MED ORDER — SODIUM CHLORIDE 0.9 % IV SOLN: 20.0000 mL | INTRAVENOUS | Status: DC

## 2014-07-28 MED ORDER — DOXYCYCLINE HYCLATE 100 MG PO TABS
100.0000 mg | ORAL_TABLET | Freq: Two times a day (BID) | ORAL | Status: DC
  Administered 2014-07-28: 100 mg via ORAL
  Filled 2014-07-28: qty 1

## 2014-07-28 MED ORDER — ALPRAZOLAM 0.5 MG PO TABS
0.5000 mg | ORAL_TABLET | Freq: Three times a day (TID) | ORAL | Status: DC | PRN
  Administered 2014-07-28: 0.5 mg via ORAL
  Filled 2014-07-28: qty 1

## 2014-07-28 MED ORDER — LENVATINIB (24 MG DAILY DOSE) 2 X 10 MG & 4 MG PO CPPK: 24.0000 mg | ORAL_CAPSULE | Freq: Every day | ORAL | Status: DC

## 2014-07-28 MED ORDER — POLYETHYLENE GLYCOL 3350 17 G PO PACK
17.0000 g | PACK | Freq: Every day | ORAL | Status: DC
Start: 2014-07-28 — End: 2014-07-29
  Administered 2014-07-29: 17 g via ORAL
  Filled 2014-07-28: qty 1

## 2014-07-28 MED ORDER — AMLODIPINE BESYLATE 5 MG PO TABS
10.0000 mg | ORAL_TABLET | Freq: Every day | ORAL | Status: DC
  Administered 2014-07-29: 10 mg via ORAL
  Filled 2014-07-28: qty 2

## 2014-07-28 MED ORDER — SODIUM CHLORIDE 0.9 % IV SOLN
INTRAVENOUS | Status: DC
  Administered 2014-07-28: 23:00:00 via INTRAVENOUS

## 2014-07-28 MED ORDER — ONDANSETRON HCL 4 MG/2ML IJ SOLN: 4.0000 mg | Freq: Four times a day (QID) | INTRAMUSCULAR | Status: DC | PRN

## 2014-07-28 MED ORDER — ONDANSETRON 4 MG PO TBDP: 8.0000 mg | ORAL_TABLET | Freq: Three times a day (TID) | ORAL | Status: DC | PRN

## 2014-07-28 MED ORDER — ASPIRIN 81 MG PO CHEW
324.0000 mg | CHEWABLE_TABLET | Freq: Once | ORAL | Status: AC
  Administered 2014-07-28: 324 mg via ORAL
  Filled 2014-07-28: qty 4

## 2014-07-28 MED ORDER — VITAMIN D-3 25 MCG (1000 UT) PO CAPS: 1000.0000 [IU] | ORAL_CAPSULE | Freq: Every day | ORAL | Status: DC

## 2014-07-28 MED ORDER — SIMVASTATIN 20 MG PO TABS
20.0000 mg | ORAL_TABLET | Freq: Every day | ORAL | Status: DC
  Administered 2014-07-29: 20 mg via ORAL
  Filled 2014-07-28: qty 1

## 2014-07-28 MED ORDER — LEVOTHYROXINE SODIUM 75 MCG PO TABS
150.0000 ug | ORAL_TABLET | Freq: Every day | ORAL | Status: DC
  Administered 2014-07-29: 150 ug via ORAL
  Filled 2014-07-28: qty 2

## 2014-07-28 MED ORDER — POTASSIUM CHLORIDE CRYS ER 20 MEQ PO TBCR
40.0000 meq | EXTENDED_RELEASE_TABLET | Freq: Every day | ORAL | Status: DC
  Administered 2014-07-29: 40 meq via ORAL
  Filled 2014-07-28: qty 2

## 2014-07-28 MED ORDER — LISINOPRIL 10 MG PO TABS
30.0000 mg | ORAL_TABLET | Freq: Two times a day (BID) | ORAL | Status: DC
  Administered 2014-07-28 – 2014-07-29 (×2): 30 mg via ORAL
  Filled 2014-07-28 (×2): qty 3

## 2014-07-28 MED ORDER — HEPARIN SODIUM (PORCINE) 5000 UNIT/ML IJ SOLN
5000.0000 [IU] | Freq: Three times a day (TID) | INTRAMUSCULAR | Status: DC
  Administered 2014-07-29: 5000 [IU] via SUBCUTANEOUS
  Filled 2014-07-28: qty 1

## 2014-07-28 MED ORDER — DOCUSATE SODIUM 100 MG PO CAPS
100.0000 mg | ORAL_CAPSULE | Freq: Two times a day (BID) | ORAL | Status: DC
  Administered 2014-07-28 – 2014-07-29 (×2): 100 mg via ORAL
  Filled 2014-07-28 (×6): qty 1

## 2014-07-28 MED ORDER — FAMOTIDINE 20 MG PO TABS
20.0000 mg | ORAL_TABLET | Freq: Two times a day (BID) | ORAL | Status: DC
Start: 2014-07-28 — End: 2014-07-29
  Administered 2014-07-28 – 2014-07-29 (×2): 20 mg via ORAL
  Filled 2014-07-28 (×2): qty 1

## 2014-07-28 NOTE — ED Provider Notes (Signed)
CSN: [id number]     Arrival date & time 07/28/14  1848 History   First MD Initiated Contact with Patient 07/28/14 1900     Chief Complaint  Patient presents with  . Chest Pain  . Migraine     (Consider location/radiation/quality/duration/timing/severity/associated sxs/prior Treatment) HPI  59 y.o. Female with complicated medical history currently on oral chemotherapy (lenvima) for thyroid cancer who has had hypertension difficulty to control since starting presents today with chest pain which began about 4 p.m. With sbp to 220 and took clonidine 0.1.  Lasted about an hour then recurred about 6 pm at rest. Pain was tightness radiated to right jaw.  No associated symptoms.  No previous episodes or evaluation for same.  Patient nwo with bp 141/78.  PMD Dr. Gerarda Fraction, oncology at [facility]    Past Medical History  Diagnosis Date  . Hypertension   . PONV (postoperative nausea and vomiting)   . Anxiety   . Depression   . COPD (chronic obstructive pulmonary disease)   . GERD (gastroesophageal reflux disease)   . Headache(784.0)   . Arthritis   . Hx of adenomatous colonic polyps   . Pulmonary nodules   . Emphysema   . Allergy     hips and shoulders  . Cataract     1 lens implaqnt right eye,intact on left eye  . Goiter     nodule watching  . Fibromyalgia     neuropathy tops of both feet  . History of radiation therapy 10/06/11    1 fraction  15 gray  . Pulmonary emboli 11/04/11    right upper lobe and r lower lobe PE  . Lung nodule seen on imaging study 11/04/11 CT    70m LLL  . DVT (deep venous thrombosis) 12/09/2011  . Pulmonary embolism 12/09/2011  . Hyperlipidemia   . History of radiation therapy 10/06/11    SRS 15Gy 168f brain  . UTI (lower urinary tract infection)   . Diabetes mellitus     borderline  . Adenocarcinoma 09/28/2011  . 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 Metastasis to brain 09/15/11     left frontoparietal region  . Metastatic  adenocarcinoma to brain, unknown primary   . Thyroid cancer 2015  . S/P radiation therapy 05/31/14 SRS    brain   Past Surgical History  Procedure Laterality Date  . Abdominal hysterectomy  1996  . Cholecystectomy  2000  . Shoulder surgery  1998    right  . Eye surgery  2001    vision correction  . Foot surgery  2005    left  . Craniotomy  09/15/2011    Procedure: CRANIOTOMY TUMOR EXCISION;  Surgeon: RoHosie SpangleMD;  [facility];  Service: Neurosurgery;  Laterality: N/A;  Craniotomy resection of tumor with stealth  . Cataract extraction  2011    with lens implant  . [facility]29-2013    [facility]    . Video bronchoscopy with endobronchial ultrasound N/A 04/11/2014    Procedure: VIDEO BRONCHOSCOPY WITH ENDOBRONCHIAL ULTRASOUND;  Surgeon: StMelrose NakayamaMD;  [facility];  Service: Thoracic;  Laterality: N/A;   Family History  Problem Relation Age of Onset  . Asthma Mother   . Kidney failure Father   . Diabetes Sister   . Heart attack Sister   . Colon cancer Brother   . COPD Sister   .  Aneurysm Paternal Grandmother     brain  . Parkinsonism Maternal Uncle   . COPD Brother    History  Substance Use Topics  . Smoking status: Former Smoker -- 1.00 packs/day for 40 years    Types: Cigarettes    Quit date: 04/06/2011  . Smokeless tobacco: Never Used  . Alcohol Use: No   OB History    No data available     Review of Systems  All other systems reviewed and are negative.     Allergies  Penicillins; Codeine; and Tape  Home Medications   Prior to Admission medications   Medication Sig Start Date End Date Taking? Authorizing Provider  ALPRAZolam Duanne Moron) 0.5 MG tablet Take 0.5 mg by mouth 3 (three) times daily as needed for anxiety.  01/04/12   Historical Provider, MD  aspirin 81 MG chewable tablet Chew 81 mg by mouth daily.    Historical Provider, MD  cetirizine (ZYRTEC) 10 MG  tablet Take 10 mg by mouth daily.    Historical Provider, MD  Cholecalciferol (VITAMIN D-3 PO) Take 2,000 Units by mouth daily.    Historical Provider, MD  docusate sodium (COLACE) 100 MG capsule Take 100 mg by mouth 2 (two) times daily.    Historical Provider, MD  esomeprazole (NEXIUM) 20 MG capsule Take 20 mg by mouth daily at 12 noon.    Historical Provider, MD  fentaNYL (DURAGESIC - DOSED MCG/HR) 50 MCG/HR Place 1 patch (50 mcg total) onto the skin every 3 (three) days. 04/08/14   Baird Cancer, PA-C  ibuprofen (ADVIL,MOTRIN) 800 MG tablet Take 400-800 mg by mouth every 8 (eight) hours as needed for pain.     Historical Provider, MD  levothyroxine (SYNTHROID, LEVOTHROID) 150 MCG tablet Take 150 mcg by mouth daily before breakfast.    Historical Provider, MD  metoprolol tartrate (LOPRESSOR) 25 MG tablet Take 25 mg by mouth 2 (two) times daily.    Historical Provider, MD  olmesartan-hydrochlorothiazide (BENICAR HCT) 20-12.5 MG per tablet Take 0.25 tablets by mouth daily. Takes 1/4 tablet daily    Historical Provider, MD  ondansetron (ZOFRAN ODT) 8 MG disintegrating tablet Take 1 tablet (8 mg total) by mouth every 8 (eight) hours as needed for nausea or vomiting. 04/05/14   Pattricia Boss, MD  oxyCODONE-acetaminophen (ROXICET) 5-325 MG per tablet Take 1 tablet by mouth every 4 (four) hours as needed for severe pain. Patient not taking: Reported on 07/15/2014 05/04/14   Ashley Murrain, NP  polyethylene glycol (MIRALAX / GLYCOLAX) packet Take 17 g by mouth daily. For constipation    Historical Provider, MD  Potassium 99 MG TABS Take 1 tablet by mouth daily.    Historical Provider, MD  simvastatin (ZOCOR) 20 MG tablet Take 20 mg by mouth daily.    Historical Provider, MD   There were no vitals taken for this visit. Physical Exam  Constitutional: She is oriented to person, place, and time. She appears well-developed and well-nourished.  HENT:  Head: Normocephalic and atraumatic.  Right Ear: External ear  normal.  Left Ear: External ear normal.  Nose: Nose normal.  Mouth/Throat: Oropharynx is clear and moist.  Eyes: Conjunctivae and EOM are normal. Pupils are equal, round, and reactive to light.  Neck: Normal range of motion. Neck supple. No JVD present. No tracheal deviation present. No thyromegaly present.  Cardiovascular: Normal rate, regular rhythm, normal heart sounds and intact distal pulses.   Pulmonary/Chest: Effort normal and breath sounds normal. She has no wheezes.  Abdominal:  Soft. Bowel sounds are normal. She exhibits no mass. There is no tenderness. There is no guarding.  Musculoskeletal: Normal range of motion.  Lymphadenopathy:    She has no cervical adenopathy.  Neurological: She is alert and oriented to person, place, and time. She has normal reflexes. No cranial nerve deficit or sensory deficit. Gait normal. GCS eye subscore is 4. GCS verbal subscore is 5. GCS motor subscore is 6.  Reflex Scores:      Bicep reflexes are 2+ on the right side and 2+ on the left side.      Patellar reflexes are 2+ on the right side and 2+ on the left side. Strength is normal and equal throughout. Cranial nerves grossly intact. Patient fluent. No gross ataxia and patient able to ambulate without difficulty.  Skin: Skin is warm and dry.  Psychiatric: She has a normal mood and affect. Her behavior is normal. Judgment and thought content normal.  Nursing note and vitals reviewed.   ED Course  Procedures (including critical care time) Labs Review Labs Reviewed - No data to display  Imaging Review Dg Chest Portable 1 View  07/28/2014   CLINICAL DATA:  59 year old female with chest pain  EXAM: PORTABLE CHEST - 1 VIEW  COMPARISON:  Prior chest x-Yonas Bunda 04/10/2014  FINDINGS: Stable cardiac and mediastinal contours. Streaky perihilar and bibasilar opacities are favored to reflect subsegmental atelectasis. Inspiratory volumes are low. No focal airspace consolidation, pleural effusion or pneumothorax.  No pulmonary edema. Hyperlucency in the upper lungs consistent with underlying emphysema.  IMPRESSION: 1. Streaky perihilar and bibasilar airspace opacities favored to reflect areas of subsegmental atelectasis. Mild early interstitial edema is thought less likely given the absence of a pulmonary vascular congestion. 2. Background parenchymal changes of pulmonary emphysema.   Electronically Signed   By: Jacqulynn Cadet M.D.   On: 07/28/2014 20:10     EKG Interpretation   Date/Time:  Sunday July 28 2014 18:58:30 EDT Ventricular Rate:  75 PR Interval:  175 QRS Duration: 82 QT Interval:  390 QTC Calculation: 436 R Axis:   12 Text Interpretation:  Sinus rhythm No significant change since last  tracing Confirmed by Keanen Dohse MD, Andee Poles [id number]) on 07/28/2014 7:15:23 PM      MDM   Final diagnoses:  None    59 y.o. Female with heart score of 5 with sscp intermittently today.  First troponin normal.  Plan admission for further evaluation.     Pattricia Boss, MD 07/31/14 [phone]

## 2014-07-28 NOTE — H&P (Signed)
[facility] History and Physical  Haley Lowe VZC:[id number] DOB: 18-Mar-1956 DOA: 07/28/2014  Referring physician: ER PCP: [name], MD   Chief Complaint: Chest tightness  HPI: Haley Lowe is a 59 y.o. female  This is a 59 year old lady who has a history of thyroid cancer with metastatic disease to the brain and now presents with episodes of chest tightness. Today at 4 PM, on minimal exertion, she started getting chest tightness across the central chest and this lasted approximately 30 minutes. He did not radiate at the time and was not associated with dyspnea, sweating or nausea. She seemed to recover from this but had a headache and went to rest. When she awoke, approximately 6:15, she had the chest tightness again and this lasted 45 minutes with possible radiation to the throat/right jaw. She denies any palpitations, limb weakness. Her sister had a myocardial infarction and died with it at the age of 47. She is an ex smoker, having been a smoker for a couple of decades. She is hypertensive. She is not diabetic. She is now being admitted for further investigation of her chest pain.   Review of Systems:  Apart from symptoms above, all systems negative.  Past Medical History  Diagnosis Date  . Hypertension   . PONV (postoperative nausea and vomiting)   . Anxiety   . Depression   . COPD (chronic obstructive pulmonary disease)   . GERD (gastroesophageal reflux disease)   . Headache(784.0)   . Arthritis   . Hx of adenomatous colonic polyps   . Pulmonary nodules   . Emphysema   . Allergy     hips and shoulders  . Cataract     1 lens implaqnt right eye,intact on left eye  . Goiter     nodule watching  . Fibromyalgia     neuropathy tops of both feet  . History of radiation therapy 10/06/11    1 fraction  15 gray  . Pulmonary emboli 11/04/11    right upper lobe and r lower lobe PE  . Lung nodule seen on imaging study 11/04/11 CT    46m LLL  . DVT (deep venous  thrombosis) 12/09/2011  . Pulmonary embolism 12/09/2011  . Hyperlipidemia   . History of radiation therapy 10/06/11    SRS 15Gy 176f brain  . UTI (lower urinary tract infection)   . Diabetes mellitus     borderline  . Adenocarcinoma 09/28/2011  . 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 Metastasis to brain 09/15/11     left frontoparietal region  . Metastatic adenocarcinoma to brain, unknown primary   . Thyroid cancer 2015  . S/P radiation therapy 05/31/14 SRS    brain   Past Surgical History  Procedure Laterality Date  . Abdominal hysterectomy  1996  . Cholecystectomy  2000  . Shoulder surgery  1998    right  . Eye surgery  2001    vision correction  . Foot surgery  2005    left  . Craniotomy  09/15/2011    Procedure: CRANIOTOMY TUMOR EXCISION;  Surgeon: RoHosie SpangleMD;  [place] [facility];  Service: Neurosurgery;  Laterality: N/A;  Craniotomy resection of tumor with stealth  . Cataract extraction  2011    with lens implant  . [facility]  [facility]-29-2013    [facility]    . Video bronchoscopy with endobronchial ultrasound N/A 04/11/2014  Procedure: VIDEO BRONCHOSCOPY WITH ENDOBRONCHIAL ULTRASOUND;  Surgeon: Melrose Nakayama, MD;  Location: [place];  Service: Thoracic;  Laterality: N/A;   Social History:  reports that she quit smoking about 3 years ago. Her smoking use included Cigarettes. She has a 40 pack-year smoking history. She has never used smokeless tobacco. She reports that she does not drink alcohol or use illicit drugs.  Allergies  Allergen Reactions  . Penicillins Swelling    " my brain swelled, and mymouth"  . Codeine Itching    Mild takes benadryl  . Tape Rash    Family History  Problem Relation Age of Onset  . Asthma Mother   . Kidney failure Father   . Diabetes Sister   . Heart attack Sister   . Colon cancer Brother   . COPD Sister   . Aneurysm Paternal  Grandmother     brain  . Parkinsonism Maternal Uncle   . COPD Brother       Prior to Admission medications   Medication Sig Start Date End Date Taking? Authorizing Provider  ALPRAZolam Duanne Moron) 0.5 MG tablet Take 0.5 mg by mouth 3 (three) times daily as needed for anxiety.  01/04/12  Yes Historical Provider, MD  amLODipine (NORVASC) 10 MG tablet Take 10 mg by mouth daily.   Yes Historical Provider, MD  Cholecalciferol (VITAMIN D-3 PO) Take 1,000 Units by mouth daily.    Yes Historical Provider, MD  docusate sodium (COLACE) 100 MG capsule Take 100 mg by mouth 2 (two) times daily.   Yes Historical Provider, MD  doxycycline (VIBRAMYCIN) 100 MG capsule Take 100 mg by mouth 2 (two) times daily.   Yes Historical Provider, MD  hydrochlorothiazide (HYDRODIURIL) 25 MG tablet Take 25 mg by mouth daily.   Yes Historical Provider, MD  ibuprofen (ADVIL,MOTRIN) 800 MG tablet Take 400-800 mg by mouth every 8 (eight) hours as needed for pain.    Yes Historical Provider, MD  Lenvatinib Mesylate (LENVIMA 24 MG DAILY DOSE PO) Take 24 mg by mouth daily.   Yes Historical Provider, MD  levothyroxine (SYNTHROID, LEVOTHROID) 150 MCG tablet Take 150 mcg by mouth daily before breakfast.   Yes Historical Provider, MD  lisinopril (PRINIVIL,ZESTRIL) 30 MG tablet Take 30 mg by mouth 2 (two) times daily.   Yes Historical Provider, MD  ondansetron (ZOFRAN ODT) 8 MG disintegrating tablet Take 1 tablet (8 mg total) by mouth every 8 (eight) hours as needed for nausea or vomiting. 04/05/14  Yes Pattricia Boss, MD  polyethylene glycol (MIRALAX / GLYCOLAX) packet Take 17 g by mouth daily. For constipation   Yes Historical Provider, MD  Potassium 99 MG TABS Take 1 tablet by mouth daily.   Yes Historical Provider, MD  ranitidine (ZANTAC) 300 MG tablet Take 300 mg by mouth at bedtime.   Yes Historical Provider, MD  simvastatin (ZOCOR) 20 MG tablet Take 20 mg by mouth daily.   Yes Historical Provider, MD  fentaNYL (DURAGESIC - DOSED MCG/HR)  50 MCG/HR Place 1 patch (50 mcg total) onto the skin every 3 (three) days. 04/08/14   Baird Cancer, PA-C  olmesartan-hydrochlorothiazide (BENICAR HCT) 20-12.5 MG per tablet Take 0.25 tablets by mouth daily. Takes 1/4 tablet daily    Historical Provider, MD  oxyCODONE-acetaminophen (ROXICET) 5-325 MG per tablet Take 1 tablet by mouth every 4 (four) hours as needed for severe pain. Patient not taking: Reported on 07/15/2014 05/04/14   Ashley Murrain, NP   Physical Exam: Danley Danker Vitals:   07/28/14 1953  07/28/14 2000 07/28/14 2030 07/28/14 2058  BP: 135/94 138/93 132/83 132/83  Pulse: 66 59 59 59  Resp: '16 19 12 14  '$ SpO2: 96% 97% 93% 93%    Wt Readings from Last 3 Encounters:  05/15/14 75.751 kg (167 lb)  05/04/14 80.287 kg (177 lb)  04/10/14 78.382 kg (172 lb 12.8 oz)    General:  Appears calm and comfortable. She is not in pain at the present time. Eyes: PERRL, normal lids, irises & conjunctiva ENT: grossly normal hearing, lips & tongue Neck: no LAD, masses or thyromegaly Cardiovascular: RRR, no m/r/g. No LE edema. Telemetry: SR, no arrhythmias  Respiratory: CTA bilaterally, no w/r/r. Normal respiratory effort. Abdomen: soft, ntnd Skin: no rash or induration seen on limited exam Musculoskeletal: grossly normal tone BUE/BLE Psychiatric: grossly normal mood and affect, speech fluent and appropriate Neurologic: grossly non-focal.          Labs on Admission:  Basic Metabolic Panel:  Recent Labs Lab 07/28/14 1924  NA 129*  K 3.4*  CL 87*  CO2 33*  GLUCOSE 113*  BUN 15  CREATININE 0.70  CALCIUM 10.1   Liver Function Tests:  Recent Labs Lab 07/28/14 1924  AST 26  ALT 20  ALKPHOS 111  BILITOT 0.8  PROT 7.8  ALBUMIN 4.5   No results for input(s): LIPASE, AMYLASE in the last 168 hours. No results for input(s): AMMONIA in the last 168 hours. CBC:  Recent Labs Lab 07/28/14 1924  WBC 5.8  HGB 16.1*  HCT 45.4  MCV 83.2  PLT 149*   Cardiac Enzymes: No results  for input(s): CKTOTAL, CKMB, CKMBINDEX, TROPONINI in the last 168 hours.  BNP (last 3 results) No results for input(s): BNP in the last 8760 hours.  ProBNP (last 3 results) No results for input(s): PROBNP in the last 8760 hours.  CBG: No results for input(s): GLUCAP in the last 168 hours.  Radiological Exams on Admission: Dg Chest Portable 1 View  07/28/2014   CLINICAL DATA:  59 year old female with chest pain  EXAM: PORTABLE CHEST - 1 VIEW  COMPARISON:  Prior chest x-ray 04/10/2014  FINDINGS: Stable cardiac and mediastinal contours. Streaky perihilar and bibasilar opacities are favored to reflect subsegmental atelectasis. Inspiratory volumes are low. No focal airspace consolidation, pleural effusion or pneumothorax. No pulmonary edema. Hyperlucency in the upper lungs consistent with underlying emphysema.  IMPRESSION: 1. Streaky perihilar and bibasilar airspace opacities favored to reflect areas of subsegmental atelectasis. Mild early interstitial edema is thought less likely given the absence of a pulmonary vascular congestion. 2. Background parenchymal changes of pulmonary emphysema.   Electronically Signed   By: Jacqulynn Cadet M.D.   On: 07/28/2014 20:10    EKG: Independently reviewed. Normal sinus rhythm without any acute ST-T wave changes.  Assessment/Plan   1. Chest tightness. The history is somewhat suggestive of cardiac pain. She is very anxious. She has multiple risk factors. We will cycle cardiac enzymes. We will request cardiology consultation. 2. 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 She is having ongoing chemotherapy treatment for this. 3. Hypertension. This is controlled at the present time. Her blood pressure was systolic 629 at home but she took clonidine 0.1 mg and this seemed to have brought it under control now.   Further recommendations will depend on patient's hospital progress.  Code Status: full code.   DVT Prophylaxis: Heparin  Family  Communication: I discussed the plan with the patient at the bedside.   Disposition Plan: home when medically  stable.    Time: 45 minutes.   Doree Albee [facility] Pager [phone].

## 2014-07-29 ENCOUNTER — Encounter (HOSPITAL_COMMUNITY): Payer: Self-pay | Admitting: Cardiology

## 2014-07-29 ENCOUNTER — Institutional Professional Consult (permissible substitution): Payer: Medicare Other | Admitting: Neurology

## 2014-07-29 DIAGNOSIS — E782 Mixed hyperlipidemia: Secondary | ICD-10-CM

## 2014-07-29 DIAGNOSIS — K219 Gastro-esophageal reflux disease without esophagitis: Secondary | ICD-10-CM | POA: Diagnosis not present

## 2014-07-29 DIAGNOSIS — I10 Essential (primary) hypertension: Secondary | ICD-10-CM | POA: Diagnosis not present

## 2014-07-29 DIAGNOSIS — J449 Chronic obstructive pulmonary disease, unspecified: Secondary | ICD-10-CM

## 2014-07-29 DIAGNOSIS — C73 Malignant neoplasm of thyroid gland: Secondary | ICD-10-CM | POA: Diagnosis not present

## 2014-07-29 DIAGNOSIS — R079 Chest pain, unspecified: Secondary | ICD-10-CM | POA: Diagnosis not present

## 2014-07-29 DIAGNOSIS — R0789 Other chest pain: Secondary | ICD-10-CM | POA: Diagnosis not present

## 2014-07-29 LAB — LIPID PANEL
Cholesterol: 152 mg/dL (ref 0–200)
HDL: 40 mg/dL (ref >39)
LDL Cholesterol: 65 mg/dL (ref 0–99)
TRIGLYCERIDES: 234 mg/dL — AB (ref <150)
Total CHOL/HDL Ratio: 3.8 RATIO
VLDL: 47 mg/dL — ABNORMAL HIGH (ref 0–40)

## 2014-07-29 LAB — TROPONIN I
Troponin I: <0.03 ng/mL (ref <0.031)
Troponin I: <0.03 ng/mL (ref <0.031)

## 2014-07-29 MED ORDER — PANTOPRAZOLE SODIUM 40 MG PO TBEC
40.0000 mg | DELAYED_RELEASE_TABLET | Freq: Every day | ORAL | Status: DC
  Administered 2014-07-29: 40 mg via ORAL
  Filled 2014-07-29: qty 1

## 2014-07-29 MED ORDER — PANTOPRAZOLE SODIUM 40 MG PO TBEC: 40.0000 mg | DELAYED_RELEASE_TABLET | Freq: Every day | ORAL | Status: DC

## 2014-07-29 NOTE — Discharge Summary (Signed)
Physician Discharge Summary  Haley Lowe:[id number] DOB: 06-Nov-1955 DOA: 07/28/2014  PCP: [name], MD  Admit date: 07/28/2014 Discharge date: 07/29/2014  Time spent: 40 minutes  Recommendations for Outpatient Follow-up:  1. PCP 1-2 weeks for evaluation of GERD treatment 2.   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 At 4 PM, on minimal exertion, she started getting chest tightness across the central chest and this lasted approximately 30 minutes. Reportedly the pain  did not radiate at the time and was not associated with dyspnea, sweating or nausea. She seemed to recover from this but had a headache and went to rest. When she awoke, approximately 6:15, she had the chest tightness again and this lasted 45 minutes with possible radiation to the throat/right jaw. She denied any palpitations, limb weakness. Her sister had a myocardial infarction and died with it at the age of 84. She is an ex smoker, having been a smoker for a couple of decades. She was hypertensive. She is not diabetic.    Hospital Course:  1. Chest tightness. Typical and atypical features. Risk factors include family medical hx, HTN, former smoker. No event on tele. toponin negative x3. EKG nonspecific. Evaluated by cardiology who recommend echo to assess LV function and PPI. Also recommend if chest tightness continues in spite of PPI may consider Cardiolite study to assess ischemic burden.   2. 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 She  is having ongoing chemotherapy treatment for this. Appears stable at baseline. TSH 5.1. Chart review indicates referred to DR Melton Alar endocrinologist at [facility].  3. Hypertension. Fair control. Home meds include amlodipine, HCTZ, lisinopril, benicar. Holding benicar but continuing the rest.  4. Anxiety: anxious on admission. Improved this am. Continue home medication.  5. Left hilar adenopathy: s/p bronchoscopy with edobronchial [place] with cytology + adenocarcinoma. See #2.  6. GERD: report frequent "reflux" and "heartburn". Home meds include zantac. Consider PPI. May be influencing #1.   Procedures:  none  Consultations:  cardiology  Discharge Exam: Filed Vitals:   07/29/14 1530  BP: 157/79  Pulse: 63  Temp: 97.7 F (36.5 C)  Resp: 16    General: well nourished appears comfortable Cardiovascular: RRR no MGR No LE edema Respiratory: normal effort BS clear bilaterally no wheeze Abdomen: soft +BS non-distended non-tender   Discharge Instructions    Current Discharge Medication List    START taking these medications   Details  pantoprazole (PROTONIX) 40 MG tablet Take 1 tablet (40 mg total) by mouth daily. Qty: 60 tablet, Refills: 1      CONTINUE these medications which have NOT CHANGED   Details  ALPRAZolam (XANAX) 0.5 MG tablet Take 0.5 mg by mouth 3 (three) times daily as needed for anxiety.     amLODipine (NORVASC) 10 MG tablet Take 10 mg by mouth daily.    Cholecalciferol (VITAMIN D-3 PO) Take 1,000 Units by mouth daily.    Associated Diagnoses: Metastatic adenocarcinoma to brain    docusate sodium (COLACE) 100 MG capsule  Take 100 mg by mouth 2 (two) times daily.    doxycycline (VIBRAMYCIN) 100 MG capsule Take 100 mg by mouth 2 (two) times daily.    hydrochlorothiazide (HYDRODIURIL) 25 MG tablet Take 25 mg by mouth daily.    ibuprofen (ADVIL,MOTRIN) 800 MG tablet Take 400-800 mg by mouth every 8 (eight) hours as needed for pain.     Lenvatinib Mesylate  (LENVIMA 24 MG DAILY DOSE PO) Take 24 mg by mouth daily.    levothyroxine (SYNTHROID, LEVOTHROID) 150 MCG tablet Take 150 mcg by mouth daily before breakfast.    lisinopril (PRINIVIL,ZESTRIL) 30 MG tablet Take 30 mg by mouth 2 (two) times daily.    ondansetron (ZOFRAN ODT) 8 MG disintegrating tablet Take 1 tablet (8 mg total) by mouth every 8 (eight) hours as needed for nausea or vomiting. Qty: 20 tablet, Refills: 0    polyethylene glycol (MIRALAX / GLYCOLAX) packet Take 17 g by mouth daily. For constipation    Potassium 99 MG TABS Take 1 tablet by mouth daily.    ranitidine (ZANTAC) 300 MG tablet Take 300 mg by mouth at bedtime.    simvastatin (ZOCOR) 20 MG tablet Take 20 mg by mouth daily.    fentaNYL (DURAGESIC - DOSED MCG/HR) 50 MCG/HR Place 1 patch (50 mcg total) onto the skin every 3 (three) days. Qty: 10 patch, Refills: 0   Associated Diagnoses: Metastatic adenocarcinoma to brain; Primary papillary thyroid carcinoma    olmesartan-hydrochlorothiazide (BENICAR HCT) 20-12.5 MG per tablet Take 0.25 tablets by mouth daily. Takes 1/4 tablet daily      STOP taking these medications     oxyCODONE-acetaminophen (ROXICET) 5-325 MG per tablet        Allergies  Allergen Reactions  . Penicillins Swelling    " my brain swelled, and mymouth"  . Codeine Itching    Mild takes benadryl  . Tape Rash   Follow-up Information    Follow up with [name], MD. Schedule an appointment as soon as possible for a visit in 2 weeks.   Specialty:  Internal Medicine   Why:  follow up on GERD   Contact information:   [address] [address] [phone]        The results of significant diagnostics from this hospitalization (including imaging, microbiology, ancillary and laboratory) are listed below for reference.    Significant Diagnostic Studies: Dg Chest Portable 1 View  07/28/2014   CLINICAL DATA:  59 year old female with chest pain  EXAM: PORTABLE CHEST - 1  VIEW  COMPARISON:  Prior chest x-ray 04/10/2014  FINDINGS: Stable cardiac and mediastinal contours. Streaky perihilar and bibasilar opacities are favored to reflect subsegmental atelectasis. Inspiratory volumes are low. No focal airspace consolidation, pleural effusion or pneumothorax. No pulmonary edema. Hyperlucency in the upper lungs consistent with underlying emphysema.  IMPRESSION: 1. Streaky perihilar and bibasilar airspace opacities favored to reflect areas of subsegmental atelectasis. Mild early interstitial edema is thought less likely given the absence of a pulmonary vascular congestion. 2. Background parenchymal changes of pulmonary emphysema.   Electronically Signed   By: Jacqulynn Cadet M.D.   On: 07/28/2014 20:10    Microbiology: No results found for this or any previous visit (from the past 240 hour(s)).   Labs: Basic Metabolic Panel:  Recent Labs Lab 07/28/14 1924  NA 129*  K 3.4*  CL 87*  CO2 33*  GLUCOSE 113*  BUN 15  CREATININE 0.70  CALCIUM 10.1   Liver Function Tests:  Recent Labs Lab  07/28/14 1924  AST 26  ALT 20  ALKPHOS 111  BILITOT 0.8  PROT 7.8  ALBUMIN 4.5   No results for input(s): LIPASE, AMYLASE in the last 168 hours. No results for input(s): AMMONIA in the last 168 hours. CBC:  Recent Labs Lab 07/28/14 1924  WBC 5.8  HGB 16.1*  HCT 45.4  MCV 83.2  PLT 149*   Cardiac Enzymes:  Recent Labs Lab 07/28/14 2124 07/28/14 2357 07/29/14 0322  TROPONINI <0.03 <0.03 <0.03   BNP: BNP (last 3 results) No results for input(s): BNP in the last 8760 hours.  ProBNP (last 3 results) No results for input(s): PROBNP in the last 8760 hours.  CBG: No results for input(s): GLUCAP in the last 168 hours.     SignedRadene Gunning  [facility] 07/29/2014, 4:05 PM

## 2014-07-29 NOTE — Progress Notes (Signed)
Reviewed discharge instructions and new prescription with pt and family.  Removed IV, tolerated well.  Answered all questions.  Pt escorted to main lobby for discharge via wheelchair.

## 2014-07-29 NOTE — Progress Notes (Signed)
UR completed 

## 2014-07-29 NOTE — Care Management Note (Signed)
    Page 1 of 1   07/29/2014     3:02:27 PM CARE MANAGEMENT NOTE 07/29/2014  Patient:  Haley Lowe, Haley Lowe   Account Number:  [id number]  Date Initiated:  07/29/2014  Documentation initiated by:  Jolene Provost  Subjective/Objective Assessment:   Pt is from home. Pt independent at baseline. Pt discharing home today. No CM needs.     Action/Plan:   Anticipated DC Date:  07/29/2014   Anticipated DC Plan:  [place]  CM consult      Choice offered to / List presented to:             Status of service:  Completed, signed off Medicare Important Message given?   (If response is "NO", the following Medicare IM given date fields will be blank) Date Medicare IM given:   Medicare IM given by:   Date Additional Medicare IM given:   Additional Medicare IM given by:    Discharge Disposition:  HOME/SELF CARE  Per UR Regulation:    If discussed at Long Length of Stay Meetings, dates discussed:    Comments:  07/29/2014 [place], RN, MSN, CM

## 2014-07-29 NOTE — Progress Notes (Signed)
Pt did not wish to take medicines except blood pressure.  Pt stated "probably going home, so I will take there".  BP meds administered, will continue to monitor.

## 2014-07-29 NOTE — Progress Notes (Signed)
*  PRELIMINARY RESULTS* Echocardiogram 2D Echocardiogram has been performed.  Leavy Cella 07/29/2014, 3:54 PM

## 2014-07-29 NOTE — Progress Notes (Signed)
Pt has requested to take medications that she did not want to take this morning.  Administered, charted in MAR.  Will continue to monitor.

## 2014-07-29 NOTE — Consult Note (Signed)
CARDIOLOGY CONSULT NOTE   Patient ID: Haley Lowe MRN: [id number] DOB/AGE: 1955/10/27 59 y.o.  Admit Date: 07/28/2014 Referring Physician: PTH Primary Physician: Haley Lowe., MD Consulting Cardiologist: Haley Bisono MD Reason for Consultation: Chest Tightness  Clinical Summary Haley Lowe is a 59 y.o.female with medical history to include hypertension, hypercholesterolemia, fibromyalgia, prior DVT and PE, thyroid cancer with metastasis to the brain, having undergone radiation and chemotherapy. She is currently being treated with doxycycline for inner ear infection, and has recently been placed on a new chemo medication at the same time Haley Lowe) by oncologists at [facility]. Two days ago she felt severe heartburn symptoms with "burning liquid" up into her mouth and nose. Yesterday she felt chest pressure, substernal and on the left side lasting approx 30 minutes, while waking into the kitchen. Pain subsided on its own. She laid down in bed due to fatigue. An hour later, the pain/pressure came back, substernal with right jaw tightness.   She states that she has not felt this type of pain before and thought it was related to the new medications. She has also been recently placed on an antihypertensive medication. She states she is now on 3 separate antihypertensive medications per Dr. Gerarda Lowe. She states that the jaw pain is similar to pain she experiences when she retains some fluid, as she has had numerous lymph nodes removed from her neck, and is not sure if the jaw pain was related to the chest pressure.   She presented to the ER with BP of 135/94, HR 69, O2 Sat 92%. Troponin was negative. Potasium was 3.4, Na 129, Creatinine 0.70.  She was not anemic, WBC 5.8. TSH 5.115. CXR demonstrated mild early interstitial edema, with parenchymal changes of pulmonary emphysema. EKG NSR with T-wave flattening in lead V6.  She was chest pain free when she got to ER. She was given one  baby ASA.  She states she felt some chest discomfort early this am, but burped and it was alleviated. She has been complaining of some stomach upset.   We are asked for cardiology recommendations concerning chest pressure.    Allergies  Allergen Reactions  . Penicillins Swelling    " my brain swelled, and mymouth"  . Codeine Itching    Mild takes benadryl  . Tape Rash    Medications Scheduled Medications: . amLODipine  10 mg Oral Daily  . cholecalciferol  1,000 Units Oral Daily  . docusate sodium  100 mg Oral BID  . doxycycline  100 mg Oral BID  . famotidine  20 mg Oral BID  . fentaNYL  50 mcg Transdermal Q72H  . heparin  5,000 Units Subcutaneous 3 times per day  . hydrochlorothiazide  25 mg Oral Daily  . Lenvatinib 24 MG Daily Dose  24 mg Oral Daily  . levothyroxine  150 mcg Oral QAC breakfast  . lisinopril  30 mg Oral BID  . polyethylene glycol  17 g Oral Daily  . potassium chloride  40 mEq Oral Daily  . simvastatin  20 mg Oral Daily    Infusions: . sodium chloride 50 mL/hr at 07/29/14 0000    PRN Medications: acetaminophen, ALPRAZolam, nitroGLYCERIN, ondansetron (ZOFRAN) IV, ondansetron   Past Medical History  Diagnosis Date  . Essential hypertension   . Anxiety   . Depression   . COPD (chronic obstructive pulmonary disease)   . GERD (gastroesophageal reflux disease)   . Headache(784.0)   . Arthritis   . Hx of adenomatous colonic  polyps   . Emphysema   . Cataract     1 lens implaqnt right eye,intact on left eye  . Goiter   . Fibromyalgia   . Pulmonary emboli 11/04/11    Right upper lobe and r lower lobe PE  . Lung nodule seen on imaging study 11/04/11 CT    3m LLL  . DVT (deep venous thrombosis) 12/09/2011  . Pulmonary embolism 12/09/2011  . Hyperlipidemia   . History of radiation therapy 10/06/11    SRS 15Gy 150f brain  . UTI (lower urinary tract infection)   . Borderline diabetes mellitus   . Metastatic adenocarcinoma to brain 09/15/11    Left  frontoparietal region  . Thyroid cancer 2015  . S/P radiation therapy 05/31/14 SRS    Brain    Past Surgical History  Procedure Laterality Date  . Abdominal hysterectomy  1996  . Cholecystectomy  2000  . Shoulder surgery Right 1998  . Eye surgery  2001  . Foot surgery Left 2005  . Craniotomy  09/15/2011    Procedure: CRANIOTOMY TUMOR EXCISION;  Surgeon: Haley SpangleMD;  [facility];  Service: Neurosurgery;  Laterality: N/A;  Craniotomy resection of tumor with stealth  . Cataract extraction  2011    With lens implant  . [facility]29-2013    [facility]    . Video bronchoscopy with endobronchial ultrasound N/A 04/11/2014    Procedure: VIDEO BRONCHOSCOPY WITH ENDOBRONCHIAL ULTRASOUND;  Surgeon: Haley NakayamaMD;  [facility];  Service: Thoracic;  Laterality: N/A;    Family History  Problem Relation Age of Onset  . Asthma Mother   . Kidney failure Father   . Diabetes Sister   . Heart attack Sister   . Colon cancer Brother   . COPD Sister   . Aneurysm Paternal Grandmother     Brain  . Parkinsonism Maternal Uncle   . COPD Brother     Social History Haley Lowe that she quit smoking about 3 years ago. Her smoking use included Cigarettes. She has a 40 pack-year smoking history. She has never used smokeless tobacco. Ms. HaPolitanoeports that she does not drink alcohol.  Review of Systems Complete review of systems are found to be negative unless outlined in H&P above.  Physical Examination Blood pressure 147/85, pulse 51, temperature 97.6 F (36.4 C), temperature source Oral, resp. rate 16, height '5\' 3"'$  (1.6 m), weight 165 lb 12.6 oz (75.2 kg), SpO2 92 %.  Intake/Output Summary (Last 24 hours) at 07/29/14 1453 Last data filed at 07/29/14 0000  Gross per 24 hour  Intake  41.67 ml  Output    300 ml  Net -258.33 ml    Telemetry: Sinus rhythm and sinus bradycardia with lead motion  artifact.  GEN: Overweight, short statured woman in no acute distress. HEENT: Conjunctiva and lids normal, oropharynx clear. Neck: Supple, increased girth without obvious elevated JVP or carotid bruits, no thyromegaly. Lungs: Clear to auscultation, nonlabored breathing at rest. Cardiac: Regular rate and rhythm, no S3, soft systolic murmur, no pericardial rub. Abdomen: Soft, nontender, bowel sounds present, no guarding or rebound. Extremities: Trace ankle edema, distal pulses 2+. Skin: Warm and dry. Musculoskeletal: No kyphosis. Neuropsychiatric: Alert and oriented x3, affect grossly appropriate.  Lab Results  Basic Metabolic Panel:  Recent Labs Lab 07/28/14 1924  NA 129*  K 3.4*  CL 87*  CO2 33*  GLUCOSE 113*  BUN 15  CREATININE 0.70  CALCIUM 10.1    Liver Function Tests:  Recent Labs Lab 07/28/14 1924  AST 26  ALT 20  ALKPHOS 111  BILITOT 0.8  PROT 7.8  ALBUMIN 4.5    CBC:  Recent Labs Lab 07/28/14 1924  WBC 5.8  HGB 16.1*  HCT 45.4  MCV 83.2  PLT 149*    Cardiac Enzymes:  Recent Labs Lab 07/28/14 2124 07/28/14 2357 07/29/14 0322  TROPONINI <0.03 <0.03 <0.03    Radiology: Dg Chest Portable 1 View  07/28/2014   CLINICAL DATA:  59 year old female with chest pain  EXAM: PORTABLE CHEST - 1 VIEW  COMPARISON:  Prior chest x-ray 04/10/2014  FINDINGS: Stable cardiac and mediastinal contours. Streaky perihilar and bibasilar opacities are favored to reflect subsegmental atelectasis. Inspiratory volumes are low. No focal airspace consolidation, pleural effusion or pneumothorax. No pulmonary edema. Hyperlucency in the upper lungs consistent with underlying emphysema.  IMPRESSION: 1. Streaky perihilar and bibasilar airspace opacities favored to reflect areas of subsegmental atelectasis. Mild early interstitial edema is thought less likely given the absence of a pulmonary vascular congestion. 2. Background parenchymal changes of pulmonary emphysema.    Electronically Signed   By: Jacqulynn Cadet M.D.   On: 07/28/2014 20:10    ECG: Sinus rhythm with nonspecific T-wave changes.   Impression:  1. Recent chest discomfort characterized as tightness and also worsening reflux over the last few days. She reports symptoms at nighttime including acid reflux up into her throat for 2 nights in a row, then an episode of chest tightness when she was walking to the kitchen. Cardiac enzymes have been normal, ECG is nonspecific. She states that she has not been on her typical antireflux measures which could be contributing, also potentially side effect of recent medications and chemotherapy. Having said that, she does have documented atherosclerosis by chest and abdominal CT imaging, family history of premature CAD in a sister, and personal history of hypertension as well as borderline diabetes mellitus.  2. Thyroid cancer with metastasis to the brain, status post previous treatment with chemotherapy and radiation, recently on Lenvima. Side effects of this drug do include increased blood pressure, also potentially dysrhythmia and cardiac dysfunction.  3. Essential hypertension, recently with reported blood pressure spikes despite medical therapy. Most recent blood pressure 147/85.  4. Hyperlipidemia, on statin therapy with recent LDL 65.  Recommendations:  Reviewed records and discussed the situation with the patient. She is eager to go home. Follow-up echocardiogram has been ordered to assess cardiac structure and function, mainly to exclude cardiomyopathy or focal wall motion abnormalities that might prompt further inpatient workup. If LV function is normal and she remains clinically stable, suggest treating with proton pump inhibitor for better control of reflux as a first step. Certainly if she continues to have chest tightness despite this intervention, could consideration Cardiolite study in order to assess ischemic burden and determine if she needs any  further medical therapy adjustments or cardiovascular testing.   Signed: Phill Myron. Lawrence NP [place]  07/29/2014, 2:53 PM Co-Sign MD   Attending note:  Patient seen and examined with findings outlined above. Reviewed extensive records and modified above note by Ms. Lawrence NP including personal completion of the impression and recommendations sections which reflect my findings and recommendations.  Satira Sark, M.D., F.A.C.C.

## 2014-08-26 ENCOUNTER — Ambulatory Visit
Admission: RE | Admit: 2014-08-26 | Discharge: 2014-08-26 | Disposition: A | Discharge status: Home / Self Care | Payer: Medicare Other | Source: Ambulatory Visit | Attending: Radiation Oncology | Admitting: Radiation Oncology

## 2014-08-26 DIAGNOSIS — C7931 Secondary malignant neoplasm of brain: Secondary | ICD-10-CM

## 2014-08-26 MED ORDER — GADOBENATE DIMEGLUMINE 529 MG/ML IV SOLN
15.0000 mL | Freq: Once | INTRAVENOUS | Status: AC | PRN
Start: 2014-08-26 — End: 2014-08-26
  Administered 2014-08-26: 15 mL via INTRAVENOUS

## 2014-08-27 ENCOUNTER — Encounter: Payer: Self-pay | Admitting: Radiation Therapy

## 2014-09-10 ENCOUNTER — Ambulatory Visit: Payer: Medicare Other | Admitting: Podiatry

## 2014-09-26 ENCOUNTER — Ambulatory Visit (INDEPENDENT_AMBULATORY_CARE_PROVIDER_SITE_OTHER): Payer: Medicare Other | Admitting: Podiatry

## 2014-09-26 ENCOUNTER — Encounter: Payer: Self-pay | Admitting: Podiatry

## 2014-09-26 VITALS — BP 154/99 | HR 73 | Resp 14

## 2014-09-26 DIAGNOSIS — L600 Ingrowing nail: Secondary | ICD-10-CM | POA: Diagnosis not present

## 2014-09-26 MED ORDER — NEOMYCIN-POLYMYXIN-HC 3.5-10000-1 OT SOLN: OTIC | Status: DC

## 2014-09-26 NOTE — Progress Notes (Signed)
   Subjective:    Patient ID: Haley Lowe, female    DOB: [birth year], 59 y.o.   MRN: [id number]  HPI Patient presents here today with right great toe that is painful especially when wearing shoes.it has been there about 1 1/2 years and has gotten worse. She has treated it with neosporin, alcohol, peroxide and it keeps it from getting infected.   Review of Systems  Constitutional: Positive for chills and fatigue.       Sweating  HENT:       Sinus problems  Endocrine:       Excessive thirst  Neurological: Positive for tremors.  Hematological:       Swollen lymph nodes  Psychiatric/Behavioral: The patient is nervous/anxious.        Objective:   Physical Exam: I reviewed her past medical history medications allergies surgery social history and review of systems area pulses are strongly palpable. Neurologic sensorium is intact per Semmes-Weinstein monofilament. Deep tendon reflexes are intact bilateral muscle strength is equal bilateral. Orthopedic evaluation of his result was distal to the ankle range of motion without crepitation. Cutaneous evaluation demonstrates a well hydrated cutis no erythema edema saline as drainage or odor with exception of the tibial border hallux left. This does demonstrate sharp incurvated nail margins without erythema and edema no purulence noted.         Assessment & Plan:  Ingrown nail paronychia abscess hallux left.  Plan: Discussed etiology pathology conservative versus surgical therapies at this point I performed a chemical matrixectomy along the tibial border hallux left after anesthesia was administered. She tolerated this procedure well and was given both oral and written home-going instructions for care and use of this toe. I will follow-up with her in 1-2 weeks to make sure she is healing well.

## 2014-09-26 NOTE — Patient Instructions (Signed)

## 2014-10-03 ENCOUNTER — Ambulatory Visit: Payer: Medicare Other | Admitting: Podiatry

## 2014-10-22 ENCOUNTER — Telehealth: Payer: Self-pay | Admitting: Internal Medicine

## 2014-10-22 NOTE — Telephone Encounter (Signed)
Patient states she was seen in [facility] ED for abdominal pain, bloating and was told to follow up with Dr. Olevia Perches. She reports she is being seen by oncology at [facility] also for thyroid cancer. She is currently on a chemo pill. Patient instructed to call her oncologist about the pain also. She will have records from [facility] sent to [place]. Scheduled on 11/01/14 at 2:00 Pm with Dr. Olevia Perches.

## 2014-10-28 ENCOUNTER — Other Ambulatory Visit: Payer: Self-pay

## 2014-11-01 ENCOUNTER — Ambulatory Visit: Payer: Medicare Other | Admitting: Internal Medicine

## 2014-11-04 ENCOUNTER — Encounter: Payer: Self-pay | Admitting: Radiation Therapy

## 2014-11-04 ENCOUNTER — Other Ambulatory Visit: Payer: Self-pay | Admitting: Radiation Therapy

## 2014-11-04 DIAGNOSIS — C7931 Secondary malignant neoplasm of brain: Secondary | ICD-10-CM

## 2014-11-04 NOTE — Progress Notes (Addendum)
1.  Do you need a wheel chair?    No  2. On oxygen?  No  3. Have you ever had any surgery in the body part being scanned? Yes   4. Have you ever had any surgery on your brain or heart?            Yes, 09/15/11  Dr. Sherwood Gambler Craniotomy to remove tumor   5. Have you ever had surgery on your eyes or ears?     No                6. Do you have a pacemaker or defibrillator?   No  7. Do you have a Neurostimulator?      No           8. Claustrophobic?  No  9. Any risk for metal in eyes?  No  10. Injury by bullet, buckshot, or shrapnel?  No  11. Stent?   No                                                                                                                                  12. Hx of Cancer?   Yes    Adenocarcinoma    And Thyroid                                                                                                      13. Kidney or Liver disease?  No  14. Hx of Lupus, Rheumatoid Arthritis or Scleroderma?  No  15. IV Antibiotics or long term use of NSAIDS?  No  16. HX of Hypertension?  No  17. Diabetes?  No  18. Allergy to contrast?  No  19. Recent labs.   To have labs done prior to scan    Haley Lowe   Answers verified with pt on 11/04/14 prior to scheduling MRI scan

## 2014-11-29 ENCOUNTER — Ambulatory Visit: Payer: Medicare Other

## 2014-12-02 ENCOUNTER — Emergency Department (HOSPITAL_COMMUNITY)
Admission: EM | Admit: 2014-12-02 | Discharge: 2014-12-02 | Disposition: A | Discharge status: Home / Self Care | Payer: Medicare Other | Attending: Emergency Medicine | Admitting: Emergency Medicine

## 2014-12-02 ENCOUNTER — Other Ambulatory Visit: Payer: Medicare Other

## 2014-12-02 ENCOUNTER — Encounter (HOSPITAL_COMMUNITY): Payer: Self-pay | Admitting: Cardiology

## 2014-12-02 DIAGNOSIS — R079 Chest pain, unspecified: Secondary | ICD-10-CM | POA: Diagnosis present

## 2014-12-02 DIAGNOSIS — I10 Essential (primary) hypertension: Secondary | ICD-10-CM | POA: Diagnosis not present

## 2014-12-02 DIAGNOSIS — F419 Anxiety disorder, unspecified: Secondary | ICD-10-CM | POA: Insufficient documentation

## 2014-12-02 DIAGNOSIS — M1990 Unspecified osteoarthritis, unspecified site: Secondary | ICD-10-CM | POA: Diagnosis not present

## 2014-12-02 DIAGNOSIS — J449 Chronic obstructive pulmonary disease, unspecified: Secondary | ICD-10-CM | POA: Diagnosis not present

## 2014-12-02 DIAGNOSIS — Z8669 Personal history of other diseases of the nervous system and sense organs: Secondary | ICD-10-CM | POA: Diagnosis not present

## 2014-12-02 DIAGNOSIS — F329 Major depressive disorder, single episode, unspecified: Secondary | ICD-10-CM | POA: Diagnosis not present

## 2014-12-02 DIAGNOSIS — Z86018 Personal history of other benign neoplasm: Secondary | ICD-10-CM | POA: Diagnosis not present

## 2014-12-02 DIAGNOSIS — Z880 Allergy status to penicillin: Secondary | ICD-10-CM | POA: Diagnosis not present

## 2014-12-02 DIAGNOSIS — Z8639 Personal history of other endocrine, nutritional and metabolic disease: Secondary | ICD-10-CM | POA: Diagnosis not present

## 2014-12-02 DIAGNOSIS — Z86711 Personal history of pulmonary embolism: Secondary | ICD-10-CM | POA: Insufficient documentation

## 2014-12-02 DIAGNOSIS — Z87440 Personal history of urinary (tract) infections: Secondary | ICD-10-CM | POA: Insufficient documentation

## 2014-12-02 DIAGNOSIS — K5900 Constipation, unspecified: Secondary | ICD-10-CM | POA: Diagnosis not present

## 2014-12-02 DIAGNOSIS — Z79899 Other long term (current) drug therapy: Secondary | ICD-10-CM | POA: Insufficient documentation

## 2014-12-02 DIAGNOSIS — Z85850 Personal history of malignant neoplasm of thyroid: Secondary | ICD-10-CM | POA: Diagnosis not present

## 2014-12-02 DIAGNOSIS — Z86718 Personal history of other venous thrombosis and embolism: Secondary | ICD-10-CM | POA: Diagnosis not present

## 2014-12-02 DIAGNOSIS — Z87891 Personal history of nicotine dependence: Secondary | ICD-10-CM | POA: Insufficient documentation

## 2014-12-02 DIAGNOSIS — J0110 Acute frontal sinusitis, unspecified: Secondary | ICD-10-CM | POA: Diagnosis not present

## 2014-12-02 LAB — BASIC METABOLIC PANEL
Anion gap: 8 (ref 5–15)
BUN: 9 mg/dL (ref 6–20)
CHLORIDE: 99 mmol/L — AB (ref 101–111)
CO2: 27 mmol/L (ref 22–32)
Calcium: 9 mg/dL (ref 8.9–10.3)
Creatinine, Ser: 0.61 mg/dL (ref 0.44–1.00)
GFR calc Af Amer: >60 mL/min (ref >60)
GFR calc non Af Amer: >60 mL/min (ref >60)
Glucose, Bld: 113 mg/dL — ABNORMAL HIGH (ref 65–99)
Potassium: 4.5 mmol/L (ref 3.5–5.1)
Sodium: 134 mmol/L — ABNORMAL LOW (ref 135–145)

## 2014-12-02 LAB — CBC WITH DIFFERENTIAL/PLATELET
Basophils Absolute: 0 10*3/uL (ref 0.0–0.1)
Basophils Relative: 1 % (ref 0–1)
EOS PCT: 1 % (ref 0–5)
Eosinophils Absolute: 0.1 10*3/uL (ref 0.0–0.7)
HEMATOCRIT: 45.8 % (ref 36.0–46.0)
Hemoglobin: 15.2 g/dL — ABNORMAL HIGH (ref 12.0–15.0)
LYMPHS ABS: 1 10*3/uL (ref 0.7–4.0)
LYMPHS PCT: 11 % — AB (ref 12–46)
MCH: 29.3 pg (ref 26.0–34.0)
MCHC: 33.2 g/dL (ref 30.0–36.0)
MCV: 88.4 fL (ref 78.0–100.0)
Monocytes Absolute: 0.8 10*3/uL (ref 0.1–1.0)
Monocytes Relative: 9 % (ref 3–12)
NEUTROS ABS: 6.7 10*3/uL (ref 1.7–7.7)
Neutrophils Relative %: 78 % — ABNORMAL HIGH (ref 43–77)
PLATELETS: 227 10*3/uL (ref 150–400)
RBC: 5.18 MIL/uL — AB (ref 3.87–5.11)
RDW: 13.2 % (ref 11.5–15.5)
WBC: 8.6 10*3/uL (ref 4.0–10.5)

## 2014-12-02 LAB — TROPONIN I: Troponin I: <0.03 ng/mL (ref <0.031)

## 2014-12-02 LAB — TSH: TSH: 0.272 u[IU]/mL — ABNORMAL LOW (ref 0.350–4.500)

## 2014-12-02 NOTE — Discharge Instructions (Signed)
Please continue your antibiotics as prescribed by your Oncologist for sinus infection. You may take Ibuprofen as needed for the abdominal pain associated with the medication.    Sinusitis Sinusitis is redness, soreness, and puffiness (inflammation) of the air pockets in the bones of your face (sinuses). The redness, soreness, and puffiness can cause air and mucus to get trapped in your sinuses. This can allow germs to grow and cause an infection.  HOME CARE   Drink enough fluids to keep your pee (urine) clear or pale yellow.  Use a humidifier in your home.  Run a hot shower to create steam in the bathroom. Sit in the bathroom with the door closed. Breathe in the steam 3-4 times a day.  Put a warm, moist washcloth on your face 3-4 times a day, or as told by your doctor.  Use salt water sprays (saline sprays) to wet the thick fluid in your nose. This can help the sinuses drain.  Only take medicine as told by your doctor. GET HELP RIGHT AWAY IF:   Your pain gets worse.  You have very bad headaches.  You are sick to your stomach (nauseous).  You throw up (vomit).  You are very sleepy (drowsy) all the time.  Your face is puffy (swollen).  Your vision changes.  You have a stiff neck.  You have trouble breathing. MAKE SURE YOU:   Understand these instructions.  Will watch your condition.  Will get help right away if you are not doing well or get worse. Document Released: 09/08/2007 Document Revised: 12/15/2011 Document Reviewed: 10/26/2011 [facility] Patient Information 2015 [place], [place]. This information is not intended to replace advice given to you by your health care provider. Make sure you discuss any questions you have with your health care provider.

## 2014-12-02 NOTE — ED Notes (Signed)
Was treated for sinus infection with antibiotics last Monday with no relief.  C/o weakness.

## 2014-12-02 NOTE — ED Provider Notes (Signed)
CSN: [id number]     Arrival date & time 12/02/14  0900 History   First MD Initiated Contact with Patient 12/02/14 0913     Chief Complaint  Patient presents with  . Chest Pain   HPI  Haley Lowe is a 59yo female presenting today for various complaints, including sinus infection, chest tightness, leg cramping, and abdominal pain. Was seen by PCP for sinus infection last Monday (8/22) and given Levaquin, which made her abdominal pain worse so she stopped taking this after three days. Was placed on Avelox by oncologist and she is on the third day of this medication. Abdominal pain also worse with Avelox. Abdominal pain is located in suprapubic region, however she took Ibuprofen prior to coming to ED and does not note any abdominal pain at this time. Was told in the past she has a "swollen colon" due to her chemotherapy, but she has not been on chemotherapy for almost 5 weeks. Has occasional nausea, but no vomiting. Denies diarrhea, but notes some constipation. Notes fevers in mornings which resolve during the day. Also notes chest pressure in epigastric region without radiation to arm or neck. Notes sometimes diaphoresis occurs with the chest pressure. Also complains of diffuse cramps in upper and lower extremities, which she states always happens when her thyroid level is abnormal.  Currently being treated for sinus infection x1 week. History of HTN, anxiety, COPD, Fibromyalgia, PE, and thyroid cancer with brain metastasis s/p thyroid resection and chemotherapy.  Past Medical History  Diagnosis Date  . Essential hypertension   . Anxiety   . Depression   . COPD (chronic obstructive pulmonary disease)   . GERD (gastroesophageal reflux disease)   . Headache(784.0)   . Arthritis   . Hx of adenomatous colonic polyps   . Emphysema   . Cataract     1 lens implaqnt right eye,intact on left eye  . Goiter   . Fibromyalgia   . Pulmonary emboli 11/04/11    Right upper lobe and r lower lobe PE  . Lung nodule  seen on imaging study 11/04/11 CT    79m LLL  . DVT (deep venous thrombosis) 12/09/2011  . Pulmonary embolism 12/09/2011  . Hyperlipidemia   . History of radiation therapy 10/06/11    SRS 15Gy 175f brain  . UTI (lower urinary tract infection)   . Borderline diabetes mellitus   . 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 Thyroid cancer 2015  . S/P radiation therapy 05/31/14 SRS    Brain   Past Surgical History  Procedure Laterality Date  . Abdominal hysterectomy  1996  . Cholecystectomy  2000  . Shoulder surgery Right 1998  . Eye surgery  2001  . Foot surgery Left 2005  . Craniotomy  09/15/2011    Procedure: CRANIOTOMY TUMOR EXCISION;  Surgeon: RoHosie SpangleMD;  [facility];  Service: Neurosurgery;  Laterality: N/A;  Craniotomy resection of tumor with stealth  . Cataract extraction  2011    With lens implant  . [facility]    . Video bronchoscopy with endobronchial ultrasound N/A 04/11/2014    Procedure: VIDEO BRONCHOSCOPY WITH ENDOBRONCHIAL ULTRASOUND;  Surgeon: StMelrose NakayamaMD;  [facility];  Service: Thoracic;  Laterality: N/A;   Family History  Problem Relation Age of Onset  . Asthma Mother   . Kidney failure Father   .  Diabetes Sister   . Heart attack Sister   . Colon cancer Brother   . COPD Sister   . Aneurysm Paternal Grandmother     Brain  . Parkinsonism Maternal Uncle   . COPD Brother    Social History  Substance Use Topics  . Smoking status: Former Smoker -- 1.00 packs/day for 40 years    Types: Cigarettes    Quit date: 04/06/2011  . Smokeless tobacco: Never Used  . Alcohol Use: No   OB History    No data available     Review of Systems  Constitutional: Positive for fever.  HENT: Positive for congestion, ear pain and sinus pressure.   Cardiovascular: Positive for chest pain.  Gastrointestinal: Positive for nausea,  abdominal pain and constipation. Negative for vomiting and diarrhea.      Allergies  Penicillins; Other; Codeine; and Tape  Home Medications   Prior to Admission medications   Medication Sig Start Date End Date Taking? Authorizing Provider  ALPRAZolam Duanne Moron) 0.5 MG tablet Take 0.5 mg by mouth 3 (three) times daily as needed for anxiety.  01/04/12  Yes Historical Provider, MD  amLODipine (NORVASC) 10 MG tablet Take 10 mg by mouth daily.   Yes Historical Provider, MD  docusate sodium (COLACE) 100 MG capsule Take 100 mg by mouth 2 (two) times daily.   Yes Historical Provider, MD  ibuprofen (ADVIL,MOTRIN) 800 MG tablet Take 400-800 mg by mouth every 8 (eight) hours as needed for pain.    Yes Historical Provider, MD  levothyroxine (SYNTHROID, LEVOTHROID) 100 MCG tablet Take 100 mcg by mouth daily before breakfast.   Yes Historical Provider, MD  levothyroxine (SYNTHROID, LEVOTHROID) 75 MCG tablet Take 37.5 tablets by mouth daily.  11/14/14  Yes Historical Provider, MD  lisinopril (PRINIVIL,ZESTRIL) 30 MG tablet Take 30 mg by mouth daily as needed (high blood pressure).    Yes Historical Provider, MD  moxifloxacin (AVELOX) 400 MG tablet Take 400 mg by mouth daily at 8 pm.   Yes Historical Provider, MD  ondansetron (ZOFRAN ODT) 8 MG disintegrating tablet Take 1 tablet (8 mg total) by mouth every 8 (eight) hours as needed for nausea or vomiting. 04/05/14  Yes Pattricia Boss, MD  oxyCODONE (OXY IR/ROXICODONE) 5 MG immediate release tablet Take 5 mg by mouth every 6 (six) hours as needed. 11/21/14  Yes Historical Provider, MD  polyethylene glycol (MIRALAX / GLYCOLAX) packet Take 17 g by mouth every other day. For constipation   Yes Historical Provider, MD  Simethicone (GAS-X EXTRA STRENGTH PO) Take 1 tablet by mouth daily as needed (gas relief).   Yes Historical Provider, MD  fentaNYL (DURAGESIC - DOSED MCG/HR) 50 MCG/HR Place 1 patch (50 mcg total) onto the skin every 3 (three) days. Patient not taking:  Reported on 12/02/2014 04/08/14   Baird Cancer, PA-C  hydrochlorothiazide (HYDRODIURIL) 25 MG tablet Take 25 mg by mouth daily.    Historical Provider, MD  Lenvatinib Mesylate (LENVIMA 24 MG DAILY DOSE PO) Take 24 mg by mouth daily.    Historical Provider, MD  neomycin-polymyxin-hydrocortisone (CORTISPORIN) otic solution Apply one to two drops to toe after soaking twice daily. Patient not taking: Reported on 12/02/2014 09/26/14   Max T Hyatt, DPM   BP 121/72 mmHg  Pulse 77  Resp 21  SpO2 97% Physical Exam  Constitutional: She appears well-developed and well-nourished. No distress.  HENT:  Head: Normocephalic and atraumatic.  Mouth/Throat: No oropharyngeal exudate.  Tenderness over frontal sinuses R>L, tenderness over maxillary sinuses  bilaterally, decreased illumination of Right frontal sinus.  Eyes: EOM are normal. Pupils are equal, round, and reactive to light. Right eye exhibits no discharge. Left eye exhibits no discharge.  Cardiovascular: Normal rate and regular rhythm.  Exam reveals no gallop and no friction rub.   No murmur heard. Pulmonary/Chest: Effort normal. No respiratory distress. She has no wheezes. She has no rales.  Abdominal: Soft. Bowel sounds are normal. She exhibits no distension. There is no tenderness.  Musculoskeletal: She exhibits no edema.  Neurological: She is alert.  Skin: Skin is warm and dry. No rash noted.    ED Course  Procedures (including critical care time) Labs Review Labs Reviewed  BASIC METABOLIC PANEL - Abnormal; Notable for the following:    Sodium 134 (*)    Chloride 99 (*)    Glucose, Bld 113 (*)    All other components within normal limits  CBC WITH DIFFERENTIAL/PLATELET - Abnormal; Notable for the following:    RBC 5.18 (*)    Hemoglobin 15.2 (*)    Neutrophils Relative % 78 (*)    Lymphocytes Relative 11 (*)    All other components within normal limits  TSH - Abnormal; Notable for the following:    TSH 0.272 (*)    All other  components within normal limits  TROPONIN I    Imaging Review No results found. I have personally reviewed and evaluated these images and lab results as part of my medical decision-making.   EKG Interpretation None      MDM   Final diagnoses:  None  Troponin negative and EKG with NSR. BMP with mild hyponatremia 134, mild hypochloremia 99, and glucose 113. CBC normal. TSH low at 0.272. No abdominal pain one exam as complete resolution occurred with Ibuprofen prior to presentation. Stable for discharge with continued treatment of sinus infection with Avelox and Ibuprofen as needed for abdominal pain associated with Avelox. PCP to follow up thyroid level and adjust medication.    [place], [place] 12/02/14 [place], MD 12/02/14 [phone]

## 2014-12-02 NOTE — ED Notes (Signed)
Being treated for a sinus infection since last  Monday.  Multiple complaints all weekend,  Chest tightness,  Leg cramping and abdominal pain.

## 2014-12-02 NOTE — ED Notes (Signed)
History of thyroid cancer.  Last chemo medication was July 19 th at [facility].  July 7th 2015, thyroid removed with lymph nodes removed.  Jan 29, 2015 removal of 30-40 lymph nodes removed.

## 2014-12-04 ENCOUNTER — Ambulatory Visit: Payer: Medicare Other | Admitting: Radiation Oncology

## 2014-12-06 ENCOUNTER — Other Ambulatory Visit: Payer: Self-pay

## 2014-12-06 ENCOUNTER — Encounter: Payer: Self-pay | Admitting: Gastroenterology

## 2014-12-06 ENCOUNTER — Ambulatory Visit (INDEPENDENT_AMBULATORY_CARE_PROVIDER_SITE_OTHER): Payer: Medicare Other | Admitting: Gastroenterology

## 2014-12-06 VITALS — BP 106/78 | HR 76 | Ht 63.0 in | Wt 148.1 lb

## 2014-12-06 DIAGNOSIS — R109 Unspecified abdominal pain: Secondary | ICD-10-CM

## 2014-12-06 DIAGNOSIS — K2270 Barrett's esophagus without dysplasia: Secondary | ICD-10-CM | POA: Diagnosis not present

## 2014-12-06 DIAGNOSIS — K5900 Constipation, unspecified: Secondary | ICD-10-CM

## 2014-12-06 DIAGNOSIS — K625 Hemorrhage of anus and rectum: Secondary | ICD-10-CM | POA: Diagnosis not present

## 2014-12-06 MED ORDER — OMEPRAZOLE 40 MG PO CPDR: 40.0000 mg | DELAYED_RELEASE_CAPSULE | Freq: Every day | ORAL | Status: DC

## 2014-12-06 NOTE — Progress Notes (Signed)
HPI :  The patient is a 59 year old female being seen here today in consultation for abdominal pain. The patient has previously seen Dr. Maurene Capes in this clinic over past several years.  Patient's past medical history is remarkable for metastatic thyroid cancer to the brain who is undergone surgery and chemotherapy for this issue, and is currently being followed by oncology. She has previously been treated with Lenvima but has been off this regimen for 6 weeks.  She has been experiencing abdominal pain since January 2016. She reports the pain is constant, 24/7, located all over the her abdomen but usually worse in the lower abdomen. He can't localize one specific spot that bothers her otherwise. The pain is rated at 7/10 at baseline but increases to 10 out of 10 periodically. The pain bothers her both during day and at night. She doesn't notice any significant change in the pain when eating. She has had some long-standing constipation historically, that has been made worse with narcotic use over the past several months. She reports she normally is constipated however sometimes can have diarrhea in the setting. She does find some relief with bowel movements. She endorses occasional red blood in the stools since her symptoms started. She endorses chronic nausea but doesn't vomit. She has a lot of postprandial bloating after she eats. She endorses ongoing reflux symptoms for which she previously took Nexium with relief however is no longer taking this. She denies dysphagia. She has poor appetite. She endorses weight loss over the past several months however attributes this to her malignancy therapies and stopping steroids which she was on for a long time previously. She is currently taking antibiotics for sinus infection.  The patient has been taking ibuprofen for her pain, she takes it roughly twice a day, which can provide some benefit of her lower abdominal pain. Otherwise she takes chronic narcotics. She  had previously been on fentanyl patches and long-acting oxycodone however has been able to come off the long-acting narcotic relatively recently. She takes this for neck pain in her abdominal pain. She reports she takes oxycodone 5 mg on average 6 times per day. She reports this helps her abdominal pain when she takes it. She has been taking intermittent MiraLAX for constipation as well as Colace.  Her last colonoscopy was in 10/2011 which she had a few small hyperplastic polyps removed, with normal retroflexed views and no mention of hemorrhoids. Her last EGD was in 10/2011, which was remarkable for an irregular Z line which was biopsied to show nondysplastic Barrett's esophagus, she also had an H. pylori negative gastritis. Capsule endoscopy in July 2013 showed no small bowel pathology but a small gastric ulcer was noted.  Her last CT abdomen documented in Epic is from 03/2014 in which retroperitoneal lymphadenopathy was noted. The patient reports she has had another CT abdomen last month done with Dr. Melven Sartorius but I don't see results of it today.  Past Medical History  Diagnosis Date  . Essential hypertension   . Anxiety   . Depression   . COPD (chronic obstructive pulmonary disease)   . GERD (gastroesophageal reflux disease)   . Headache(784.0)   . Arthritis   . Hx of adenomatous colonic polyps   . Emphysema   . Cataract     1 lens implaqnt right eye,intact on left eye  . Goiter   . Fibromyalgia   . Pulmonary emboli 11/04/11    Right upper lobe and r lower lobe PE  . Lung nodule  seen on imaging study 11/04/11 CT    50m LLL  . DVT (deep venous thrombosis) 12/09/2011  . Pulmonary embolism 12/09/2011  . Hyperlipidemia   . History of radiation therapy 10/06/11    SRS 15Gy 115f brain  . UTI (lower urinary tract infection)   . Borderline diabetes mellitus   . 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 Thyroid cancer 2015  . S/P radiation therapy 05/31/14 SRS     Brain     Past Surgical History  Procedure Laterality Date  . Abdominal hysterectomy  1996  . Cholecystectomy  2000  . Shoulder surgery Right 1998  . Eye surgery  2001  . Foot surgery Left 2005  . Craniotomy  09/15/2011    Procedure: CRANIOTOMY TUMOR EXCISION;  Surgeon: RoHosie SpangleMD;  [facility];  Service: Neurosurgery;  Laterality: N/A;  Craniotomy resection of tumor with stealth  . Cataract extraction  2011    With lens implant  . [facility]    . Video bronchoscopy with endobronchial ultrasound N/A 04/11/2014    Procedure: VIDEO BRONCHOSCOPY WITH ENDOBRONCHIAL ULTRASOUND;  Surgeon: StMelrose NakayamaMD;  [facility];  Service: Thoracic;  Laterality: N/A;   Family History  Problem Relation Age of Onset  . Asthma Mother   . Kidney failure Father   . Diabetes Sister   . Heart attack Sister   . Colon cancer Brother   . COPD Sister   . Aneurysm Paternal Grandmother     Brain  . Parkinsonism Maternal Uncle   . COPD Brother    Social History  Substance Use Topics  . Smoking status: Former Smoker -- 1.00 packs/day for 40 years    Types: Cigarettes    Quit date: 04/06/2011  . Smokeless tobacco: Never Used  . Alcohol Use: No   Current Outpatient Prescriptions  Medication Sig Dispense Refill  . ALPRAZolam (XANAX) 0.5 MG tablet Take 0.5 mg by mouth 3 (three) times daily as needed for anxiety.     . Marland KitchenmLODipine (NORVASC) 10 MG tablet Take 10 mg by mouth daily.    . Marland Kitchenocusate sodium (COLACE) 100 MG capsule Take 100 mg by mouth 2 (two) times daily.    . Marland Kitchenbuprofen (ADVIL,MOTRIN) 800 MG tablet Take 400-800 mg by mouth every 8 (eight) hours as needed for pain.     . Marland Kitchenevothyroxine (SYNTHROID, LEVOTHROID) 100 MCG tablet Take 100 mcg by mouth daily before breakfast.    . levothyroxine (SYNTHROID, LEVOTHROID) 75 MCG tablet Take 37.5 tablets by mouth daily.     . Magnesium 250 MG  TABS Take 1 tablet by mouth daily.    . Marland Kitchenoxifloxacin (AVELOX) 400 MG tablet Take 400 mg by mouth daily at 8 pm.    . ondansetron (ZOFRAN ODT) 8 MG disintegrating tablet Take 1 tablet (8 mg total) by mouth every 8 (eight) hours as needed for nausea or vomiting. 20 tablet 0  . oxyCODONE (OXY IR/ROXICODONE) 5 MG immediate release tablet Take 5 mg by mouth every 6 (six) hours as needed.    . Marland KitchenxyCODONE (ROXICODONE) 15 MG immediate release tablet Take 15 mg by mouth as needed for pain.    . polyethylene glycol (MIRALAX / GLYCOLAX) packet Take 17 g by mouth every other day. For constipation    . Simethicone (GAS-X EXTRA STRENGTH PO) Take 1 tablet by mouth daily as needed (  gas relief).    Marland Kitchen zolpidem (AMBIEN) 10 MG tablet Take 10 mg by mouth at bedtime as needed for sleep.    . OxyCODONE (OXYCONTIN) 20 mg T12A 12 hr tablet Take 20 mg by mouth as needed.     No current facility-administered medications for this visit.   Allergies  Allergen Reactions  . Penicillins Swelling    " my brain swelled, and mymouth"  . Other Swelling    PT ALSO REACTS TO PAPER TAPE AND BAND-AIDS.  Marland Kitchen Codeine Itching    Mild takes benadryl  . Tape Rash     Review of Systems: All systems reviewed and negative except where noted in HPI. Neck pain (+).   Epic labs reviewed. Normal Hgb, WBC, and LFTs.  CT scan 03/2014 with retroperitoneal lymphadenopathy  Physical Exam: BP 106/78 mmHg  Pulse 76  Ht '5\' 3"'$  (1.6 m)  Wt 148 lb 2 oz (67.189 kg)  BMI 26.25 kg/m2 Constitutional: Pleasant,well-developed, female in no acute distress. HEENT: Normocephalic and atraumatic. Conjunctivae are normal. No scleral icterus. Cardiovascular: Normal rate, regular rhythm.  Pulmonary/chest: Effort normal and breath sounds normal. No wheezing, rales or rhonchi. Abdominal: Soft, mild tenderness to palpation throughout without focal localization, negative Carnett sign,  Bowel sounds active throughout. There are no masses palpable. No  hepatomegaly. Extremities: no edema Neurological: Alert and oriented to person place and time. Skin: Skin is warm and dry. No rashes noted. Psychiatric: Normal mood and affect. Behavior is normal.   ASSESSMENT AND PLAN: 59 year old female with metastatic thyroid cancer to the brain, status-post therapy with surgery and radiation and chemotherapy, presenting with several months worth of abdominal pain as described above. She also endorses constipation, rectal bleeding, nausea, bloating, and poor appetite during the time she has had this abdominal pain. Of note the patient takes NSAIDs and narcotics routinely for her symptoms.  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 Her last imaging on file from December 2015 showed retroperitoneal lymphadenopathy, although endorses another CT scan done last month. Recent labs show no anemia or leukocytosis.  At this time we will obtain the results of the most recent CT scan from July to ensure no evidence of metastatic disease to the abdomen causing her symptoms, as this needs to be ruled out first (although the patient reports previously noted lymphadenopathy had resolved). Otherwise, I think she has a multifactorial etiology to her pain. She clearly has baseline constipation which is likely worsened in the setting of narcotics and her hypothyroid state, and does get improvement with a bowel movement. 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 She also takes NSAIDs routinely, and has a prior history of gastritis / gastric ulcer which could also be playing a role in her nausea and postprandial complaints. I suspect she has hemorrhoidal bleeding in the setting of constipation causing her rectal bleeding.   I discussed all of these issues with the patient  and her family today. I will await results of CT scan from July, but otherwise in light of her upper symptoms, NSAID use, and Barrett's history, offered her an upper endoscopy. At the same time, given her rectal bleeding, lower abdominal pain, and malignancy history, offered her a colonoscopy at the same time. I discussed the risks / benefits of sedation and the procedures with the patient (to  include cardiovascular compromise, bleeding, perforation, etc) and she wished to proceed with these exams.   Otherwise, at this time we will treat her constipation with MiraLAX twice daily every day and titrate as needed for 1 daily bowel movement. If this does not work we can try her on Amitiza for opioid induced constipation as her insurance has declined Movantik (should she continue narcotics). I'll otherwise recommend starting omeprazole 40 mg once daily given her NSAID use and history of gastritis to see if this helps some of her complaints. I discussed with her that her NSAIDs are potentially confounding her symptoms, would recommend stopping or minimizing their use. We otherwise had a lengthy discussion of narcotic bowel. She has chronic pain and a clear need for pain medication, however do think narcotics are contributing to her symptoms. Overtime, I asked her to try and minimize their use and consider alternative regimens for which she may consider a pain management consult for her neck pain.   Patient and family verbalized understanding and agreed with the plan. Further recommendations pending her course on this regimen and exam results.   Lynn Cellar, MD [facility]   > 30 minutes spent with patient/family, counseling, and coordinating care

## 2014-12-06 NOTE — Patient Instructions (Signed)
We have sent medications to your pharmacy for you to pick up at your convenience.  You have been scheduled for an endoscopy and colonoscopy. Please follow the written instructions given to you at your visit today. Please pick up your prep supplies at the pharmacy within the next 1-3 days. If you use inhalers (even only as needed), please bring them with you on the day of your procedure. Your physician has requested that you go to www.startemmi.com and enter the access code given to you at your visit today. This web site gives a general overview about your procedure. However, you should still follow specific instructions given to you by our office regarding your preparation for the procedure.  Try to minimize narcotic intake.   Being taking Miralax over the counter twice daily.

## 2014-12-11 ENCOUNTER — Other Ambulatory Visit: Payer: Self-pay

## 2014-12-11 ENCOUNTER — Telehealth: Payer: Self-pay | Admitting: Gastroenterology

## 2014-12-11 MED ORDER — NA SULFATE-K SULFATE-MG SULF 17.5-3.13-1.6 GM/177ML PO SOLN: ORAL | Status: DC

## 2014-12-11 NOTE — Telephone Encounter (Signed)
Called pt and sent prep to pharmacy.

## 2014-12-20 ENCOUNTER — Other Ambulatory Visit: Payer: Medicare Other

## 2014-12-23 ENCOUNTER — Ambulatory Visit: Payer: Medicare Other | Admitting: Radiation Oncology

## 2014-12-24 ENCOUNTER — Telehealth: Payer: Self-pay | Admitting: *Deleted

## 2014-12-24 NOTE — Telephone Encounter (Signed)
I spoke with Dr. Melven Sartorius today and reviewed prior CT scans. I think the patient's pain is multifactorial, however she has significant abdominal lymphadenopathy in the abdomen and likely represents metastatic disease, which may be causing her pain. We are treating her constipation, placed on PPI, and will perform endoscopy, however recommended she proceed with her chemotherapy if she can tolerate it, given this pain may be from underlying malignancy, and her symptoms have not improved while off of her chemotherapy. Dr. Melven Sartorius agreed. I will otherwise await endoscopy results.

## 2014-12-24 NOTE — Telephone Encounter (Signed)
-----   Message from Manus Gunning, MD sent at 12/24/2014 12:53 PM EDT -----  Thanks for sending this to me. I have called Dr. Melven Sartorius and am waiting her hear back. I have reviewed the patient's most recent CT abdomen. It shows persistence of abdominal lymphadenopathy however it is slightly decreased in size. Otherwise nonspecific R pericolonic fat stranding is noted. We will await her endoscopy to further evaluate her symptoms. I continue to think her pain is multifactorial. Can you assist in scanning her CT and oncology notes into her record, and placing this note into her records? I will otherwise await to hear from Dr. Melven Sartorius. Thanks   ----- Message -----    From: Algernon Huxley, RN    Sent: 12/13/2014  11:02 AM      To: Manus Gunning, MD  Dr. Havery Moros,  There are 2 numbers you can try. The first one is for their office and you follow the prompts to request to speak to a physician. The next number goes to the doctors voicemail.  [phone]  ThanksVaughan Basta ----- Message -----    From: Manus Gunning, MD    Sent: 12/12/2014   5:26 PM      To: Hulan Saas, RN  [facility],  I was wondering if you or Amber could help me out. I am trying to get in touch with a Dr. Dewitt Hoes Porosnicu at [facility] (oncology) regarding this mutual patient. I don't think she is in Epic. I reviewed the patient's recent CT scan which is concerning for metastatic cancer and wanted to discuss the case with Dr. Melven Sartorius. Can you get me her phone number? Not sure what her office is, or if the patient herself has it. Thanks much for your help.  Richardson Landry

## 2014-12-24 NOTE — Telephone Encounter (Signed)
Left a message for patient's daughter [name] to call back.

## 2014-12-30 ENCOUNTER — Other Ambulatory Visit (HOSPITAL_COMMUNITY): Payer: Self-pay | Admitting: Internal Medicine

## 2014-12-30 DIAGNOSIS — R05 Cough: Secondary | ICD-10-CM

## 2014-12-30 DIAGNOSIS — R059 Cough, unspecified: Secondary | ICD-10-CM

## 2015-01-01 ENCOUNTER — Ambulatory Visit (HOSPITAL_COMMUNITY)
Admission: RE | Admit: 2015-01-01 | Discharge: 2015-01-01 | Disposition: A | Discharge status: Home / Self Care | Payer: Medicare Other | Source: Ambulatory Visit | Attending: Internal Medicine | Admitting: Internal Medicine

## 2015-01-01 DIAGNOSIS — R05 Cough: Secondary | ICD-10-CM | POA: Diagnosis present

## 2015-01-01 DIAGNOSIS — R059 Cough, unspecified: Secondary | ICD-10-CM

## 2015-01-01 DIAGNOSIS — R1319 Other dysphagia: Secondary | ICD-10-CM | POA: Insufficient documentation

## 2015-01-15 ENCOUNTER — Encounter: Payer: Medicare Other | Admitting: Gastroenterology

## 2015-01-17 ENCOUNTER — Other Ambulatory Visit: Payer: Medicare Other

## 2015-01-20 ENCOUNTER — Ambulatory Visit: Payer: Medicare Other | Admitting: Radiation Oncology

## 2015-01-23 ENCOUNTER — Ambulatory Visit
Admission: RE | Admit: 2015-01-23 | Discharge: 2015-01-23 | Disposition: A | Discharge status: Home / Self Care | Payer: Medicare Other | Source: Ambulatory Visit | Attending: Radiation Oncology | Admitting: Radiation Oncology

## 2015-01-23 DIAGNOSIS — C7931 Secondary malignant neoplasm of brain: Secondary | ICD-10-CM

## 2015-01-23 MED ORDER — GADOBENATE DIMEGLUMINE 529 MG/ML IV SOLN
15.0000 mL | Freq: Once | INTRAVENOUS | Status: AC | PRN
  Administered 2015-01-23: 15 mL via INTRAVENOUS

## 2015-01-23 NOTE — Progress Notes (Signed)
Follow up  S/p SRS 05/31/14 Brain, MRI results 01/23/15 Se Note from [facility] [phone]      DIAGNOSIS     1. 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   EXTENSIVE ANGIOLYMPHATIC INVASION PRESENT IN SOFT TISSUE.   NO DEFINITIVE INVOLVEMENT OF JUGULAR VEIN.   (WGR[MNS])     2. LYMPH NODES, RIGHT NECK, LEVEL 5:   METASTATIC HIGH GRADE PAPILLARY CARCINOMA INVOLVING SIX OF SIX LYMPH   NODES (6/6).   EXTENSIVE VASCULAR/LYMPHATIC INVASION PRESENT IN SOFT TISSUE.   (MNS)     COMMENT   THE LARGE MASS IN SPECIMEN #1 IS AN APPARENT LYMPH NODE THAT HAS BEEN   ALMOST TOTALLY REPLACED BY CARCINOMA.EXTRACAPSULAR EXTENSION IS   PRESENT.ALSO, IN BOTH SPECIMENS #1 AND #2, THERE ARE NUMEROUS FOCI OF   INVOLVEMENT OF VASCULAR/LYMPHATIC SPACES BY CARCINOMA.         Rickard Patience MD   PATHOLOGIST   (CASE SIGNED 12/25/2014)     CLINICAL INFORMATION     1. THYROID CARCINOMA.     SPECIMEN   1. LYMPH NODE(S), RIGHT NECK, (LEVEL 2, 3, & 4)-RESECTION   2. LYMPH NODE(S), RIGHT NECK (LEVEL 5)-RESECTION     GROSS DESCRIPTION   1. JUNITA Mcgreal; RIGHT NECK (LEVEL 2, 3, AND 4) WITH ATTACHED JUGULAR   VEIN.RECEIVED IN FORMALIN IS A 6.7 X 4.2 X 2.1 CM UNORIENTED FRAGMENT   OF TAN-BROWN SOFT TISSUE.THERE IS NO DEFINITIVE VESSEL IDENTIFIED ON   THE EXTERNAL SURFACE.THE RESECTION MARGIN IS ENTIRELY INKED BLUE.  SECTIONING REVEALS A TAN-PINK MASS MEASURING 2.8 X 1.7 X 1.4 CM.  ADJACENT TO THE MASS IS A 0.4 CM IN DIAMETER LUMEN CONSISTENT WITH   POSSIBLE VESSEL.THE VESSEL IS OPENED TO REVEAL A TAN-PINK SMOOTH INNER   LINING WITH NO DEFINITIVE TUMOR INVOLVEMENT.THE VESSEL MEASURES 3.5 CM   IN LENGTH X UP TO 0.6 CM IN DIAMETER.THE REMAINDER OF THE SPECIMEN IS   DISSECTED FOR  LYMPH NODES AND ONE IS IDENTIFIED MEASURING UP TO 0.3 CM   IN GREATEST DIMENSION.THERE ARE NO ADDITIONAL DEFINITIVE   ABNORMALITIES.     REPRESENTATIVE SECTIONS ARE SUBMITTED AS FOLLOWS:1A-B, TWO FULL   THICKNESS SECTIONS OF THE MASS INCLUDING ADJACENT VESSEL.1C, ONE   INTACT LYMPH NODE.1D-E, REPRESENTATIVE ADIPOSE TISSUE FOR ADDITIONAL   POSSIBLE NODES.  2. JUNITA Bolser; RIGHT NECK LEVEL 5.RECEIVED IN FORMALIN IS A 5.4 X   3.9 X 3.2 CM FRAGMENT OF FIBROADIPOSE TISSUE.THE MARGIN IS ENTIRELY   INKED BLUE.THE SPECIMEN IS DISSECTED FOR LYMPH NODES AND ELEVEN ARE   IDENTIFIED MEASURING UP TO 1.5 CM IN GREATEST DIMENSION.THE LYMPH   NODES ARE SUBMITTED AS FOLLOWS:2A, FIVE INTACT LYMPH NODES.2B, FIVE   INTACT LYMPH NODES.2C, ONE TRISECTED LYMPH NODE.(WC)  (WGR/RSB)     THE ABOVE DIAGNOSIS IS BASED ON PERFORMANCE OF THOROUGH GROSS AND/OR   MICROSCOPIC EVALUATIONS.IMMUNOPEROXIDASE PROCEDURES WERE DEVELOPED AND   PERFORMANCE CHARACTERISTICS DETERMINED BY PATHOLOGISTS DIAGNOSTIC   LABORATORY.NOT ALL HAVE BEEN CLEARED OR APPROVED BY THE [place] FOOD AND   DRUG ADMINISTRATION.       Performed by: [facility]  [address] [place], [place], [place] [address]   Back to top of Results from Last [place]

## 2015-01-27 ENCOUNTER — Ambulatory Visit: Payer: Medicare Other | Admitting: Radiation Oncology

## 2015-01-27 ENCOUNTER — Ambulatory Visit
Admission: RE | Admit: 2015-01-27 | Discharge: 2015-01-27 | Disposition: A | Discharge status: Home / Self Care | Payer: Medicare Other | Source: Ambulatory Visit | Attending: Radiation Oncology | Admitting: Radiation Oncology

## 2015-01-27 ENCOUNTER — Other Ambulatory Visit: Payer: Medicare Other

## 2015-01-27 ENCOUNTER — Encounter: Payer: Self-pay | Admitting: Radiation Oncology

## 2015-01-27 VITALS — BP 142/93 | HR 66 | Temp 98.1°F | Resp 18 | Ht 63.0 in | Wt 158.4 lb

## 2015-01-27 DIAGNOSIS — C7931 Secondary malignant neoplasm of brain: Secondary | ICD-10-CM

## 2015-01-27 NOTE — Progress Notes (Signed)
Radiation Oncology         [phone] ________________________________  Name: Haley Lowe MRN: [id number]  Date: 01/27/2015  DOB: 06/28/1955  Follow-Up Visit Note  CC: [name], MD  [employer], MD  Diagnosis:   Metastatic adenocarcinoma with brain metastasis   Narrative:  Haley Lowe here for follow up.She reports pain in her right ear due to swelling at a 2/10. She is taking morphine 60 mg bid and oxycodone 20 mg q 4 hours. Her endocrinologist would like a call. Her name is Dr. Rodney Booze. Her pager is [phone] and her cell is [phone]. She would like to discuss whether treatment to her neck.   She denies trouble with her balance.She reports having "black spider webs" in her right vision. She was told this was floaters. She reports occasional nausea. She is not taking decadron.  Patient underwent multiple lymph node resections at [facility] on 12/23/14 as noted below.   Patient restarted chemotherapy at [facility] last week.   [phone]      DIAGNOSIS     1. 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   EXTENSIVE ANGIOLYMPHATIC INVASION PRESENT IN SOFT TISSUE.   NO DEFINITIVE INVOLVEMENT OF JUGULAR VEIN.   (WGR[MNS])     2. LYMPH NODES, RIGHT NECK, LEVEL 5:   METASTATIC HIGH GRADE PAPILLARY CARCINOMA INVOLVING SIX OF SIX LYMPH   NODES (6/6).   EXTENSIVE VASCULAR/LYMPHATIC INVASION PRESENT IN SOFT TISSUE.   (MNS)     COMMENT   THE LARGE MASS IN SPECIMEN #1 IS AN APPARENT LYMPH NODE THAT HAS BEEN   ALMOST TOTALLY REPLACED BY CARCINOMA.EXTRACAPSULAR EXTENSION IS   PRESENT.ALSO, IN BOTH SPECIMENS #1 AND #2, THERE ARE NUMEROUS FOCI OF   INVOLVEMENT OF VASCULAR/LYMPHATIC SPACES BY CARCINOMA.     Rickard Patience MD   PATHOLOGIST   (CASE SIGNED  12/25/2014)     CLINICAL INFORMATION     1. THYROID CARCINOMA.     SPECIMEN   1. LYMPH NODE(S), RIGHT NECK, (LEVEL 2, 3, & 4)-RESECTION   2. LYMPH NODE(S), RIGHT NECK (LEVEL 5)-RESECTION     GROSS DESCRIPTION   1. Haley Lowe; RIGHT NECK (LEVEL 2, 3, AND 4) WITH ATTACHED JUGULAR   VEIN.RECEIVED IN FORMALIN IS A 6.7 X 4.2 X 2.1 CM UNORIENTED FRAGMENT   OF TAN-BROWN SOFT TISSUE.THERE IS NO DEFINITIVE VESSEL IDENTIFIED ON   THE EXTERNAL SURFACE.THE RESECTION MARGIN IS ENTIRELY INKED BLUE.  SECTIONING REVEALS A TAN-PINK MASS MEASURING 2.8 X 1.7 X 1.4 CM.  ADJACENT TO THE MASS IS A 0.4 CM IN DIAMETER LUMEN CONSISTENT WITH   POSSIBLE VESSEL.THE VESSEL IS OPENED TO REVEAL A TAN-PINK SMOOTH INNER   LINING WITH NO DEFINITIVE TUMOR INVOLVEMENT.THE VESSEL MEASURES 3.5 CM   IN LENGTH X UP TO 0.6 CM IN DIAMETER.THE REMAINDER OF THE SPECIMEN IS   DISSECTED FOR LYMPH NODES AND ONE IS IDENTIFIED MEASURING UP TO 0.3 CM   IN GREATEST DIMENSION.THERE ARE NO ADDITIONAL DEFINITIVE   ABNORMALITIES.     REPRESENTATIVE SECTIONS ARE SUBMITTED AS FOLLOWS:1A-B, TWO FULL   THICKNESS SECTIONS OF THE MASS INCLUDING ADJACENT VESSEL.1C, ONE   INTACT LYMPH NODE.1D-E, REPRESENTATIVE ADIPOSE TISSUE FOR ADDITIONAL   POSSIBLE NODES.  2. Haley Lowe; RIGHT NECK LEVEL 5.RECEIVED IN FORMALIN IS A 5.4 X   3.9 X 3.2 CM  FRAGMENT OF FIBROADIPOSE TISSUE.THE MARGIN IS ENTIRELY   INKED BLUE.THE SPECIMEN IS DISSECTED FOR LYMPH NODES AND ELEVEN ARE   IDENTIFIED MEASURING UP TO 1.5 CM IN GREATEST DIMENSION.THE LYMPH   NODES ARE SUBMITTED AS FOLLOWS:2A, FIVE INTACT LYMPH NODES.2B, FIVE   INTACT LYMPH NODES.2C, ONE TRISECTED LYMPH NODE.(WC)  (WGR/RSB)     THE ABOVE DIAGNOSIS IS BASED ON PERFORMANCE OF THOROUGH GROSS AND/OR   MICROSCOPIC EVALUATIONS.IMMUNOPEROXIDASE PROCEDURES WERE DEVELOPED AND   PERFORMANCE CHARACTERISTICS DETERMINED BY  PATHOLOGISTS DIAGNOSTIC   LABORATORY.NOT ALL HAVE BEEN CLEARED OR APPROVED BY THE U.S. FOOD AND   DRUG ADMINISTRATION.       Performed by: [facility]  [address] [place], [place], [place] [address]                         ALLERGIES:  is allergic to penicillins; other; codeine; and tape.  Meds: Current Outpatient Prescriptions  Medication Sig Dispense Refill  . ALPRAZolam (XANAX) 0.5 MG tablet Take 0.5 mg by mouth 3 (three) times daily as needed for anxiety.     Marland Kitchen escitalopram (LEXAPRO) 5 MG tablet Take 5 mg by mouth.    Marland Kitchen ibuprofen (ADVIL,MOTRIN) 800 MG tablet Take 400-800 mg by mouth every 8 (eight) hours as needed for pain.     Marland Kitchen levothyroxine (SYNTHROID, LEVOTHROID) 100 MCG tablet Take 100 mcg by mouth daily before breakfast.    . levothyroxine (SYNTHROID, LEVOTHROID) 75 MCG tablet Take 37.5 tablets by mouth daily.     . Magnesium 250 MG TABS Take 1 tablet by mouth daily.    Marland Kitchen morphine (MS CONTIN) 60 MG 12 hr tablet Take 60 mg by mouth every 12 (twelve) hours.    Marland Kitchen omeprazole (PRILOSEC) 40 MG capsule Take 1 capsule (40 mg total) by mouth daily. 90 capsule 3  . ondansetron (ZOFRAN ODT) 8 MG disintegrating tablet Take 1 tablet (8 mg total) by mouth every 8 (eight) hours as needed for nausea or vomiting. 20 tablet 0  . OxyCODONE (OXYCONTIN) 20 mg T12A 12 hr tablet Take 20 mg by mouth every 4 (four) hours.     . polyethylene glycol (MIRALAX / GLYCOLAX) packet Take 17 g by mouth every other day. For constipation    . Simethicone (GAS-X EXTRA STRENGTH PO) Take 1 tablet by mouth daily as needed (gas relief).    Marland Kitchen zolpidem (AMBIEN) 10 MG tablet Take 10 mg by mouth at bedtime as needed for sleep.    Marland Kitchen docusate sodium (COLACE) 100 MG capsule Take 100 mg by mouth 2 (two) times daily.    . Na Sulfate-K Sulfate-Mg Sulf SOLN Use as directed per colonoscopy. (Patient not taking: Reported on 01/27/2015) 354 mL 0  . oxyCODONE (OXY IR/ROXICODONE) 5 MG immediate  release tablet Take 5 mg by mouth every 6 (six) hours as needed.    Marland Kitchen oxyCODONE (ROXICODONE) 15 MG immediate release tablet Take 15 mg by mouth as needed for pain.     No current facility-administered medications for this encounter.    Physical Findings: The patient is in no acute distress. Patient is alert and oriented.  height is '5\' 3"'$  (1.6 m) and weight is 158 lb 6.4 oz (71.85 kg). Her oral temperature is 98.1 F (36.7 C). Her blood pressure is 142/93 and her pulse is 66. Her respiration is 18 and oxygen saturation is 97%. .  Well healed incision in the neck bilaterally. No palpable cervical or supraclavicular lymphoadenopathy present.  General: Well-developed,  in no acute distress HEENT: Normocephalic, atraumatic    Lab Findings: Lab Results  Component Value Date   WBC 8.6 12/02/2014   HGB 15.2* 12/02/2014   HCT 45.8 12/02/2014   MCV 88.4 12/02/2014   PLT 227 12/02/2014     Radiographic Findings: Dg Chest 2 View  01/01/2015  CLINICAL DATA:  Cough, wheezing, history of thyroid cancer EXAM: CHEST  2 VIEW COMPARISON:  07/28/2014, 03/20/2014 FINDINGS: There is mild lingular scarring. There is no focal parenchymal opacity. There is no pleural effusion or pneumothorax. The heart and mediastinal contours are unremarkable. The osseous structures are unremarkable. IMPRESSION: No active cardiopulmonary disease. Electronically Signed   By: Kathreen Devoid   On: 01/01/2015 12:53   Mr Brain W Wo Contrast  01/23/2015  CLINICAL DATA:  Followup brain metastasis. Thyroid cancer. SRS staging EXAM: MRI HEAD WITHOUT AND WITH CONTRAST TECHNIQUE: Multiplanar, multiecho pulse sequences of the brain and surrounding structures were obtained without and with intravenous contrast. CONTRAST:  5m MULTIHANCE GADOBENATE DIMEGLUMINE 529 MG/ML IV SOLN COMPARISON:  MRI 08/26/2014 FINDINGS: SRS protocol at 3 Tesla. Postop left parietal craniotomy for tumor resection. No recurrent tumor in the left parietal lobe.  There is a postsurgical cavity with a mild amount of hemorrhage in the left parietal lobe. Multiple new enhancing lesions are seen consistent with metastatic disease to the brain. 12.5 mm right superior cerebellum is new. 15 mm left superior cerebellar lesion with mild hemorrhage is new. 7.6 mm lesion left posterior mid cerebellum is new. Left occipital parietal lesion 7.6 mm axial image 107. 4 mm left parietal white matter lesion image 112 3 mm right parietal subcortical lesion 118 9 x 5 mm enhancing lesion left frontal lobe is new. 9 mm right frontal superficial lesion. Small additional lesions are seen in the right frontal and parietal cortex over the convexity. Negative for acute infarct. Mild edema in the right frontal lobe. No midline shift. Ventricle size is normal. IMPRESSION: Multiple new metastatic deposits throughout the brain and cerebellum as described above. Mild edema right frontal lesion without midline shift. Electronically Signed   By: CFranchot GalloM.D.   On: 01/23/2015 14:41   Dg Esophagus  01/01/2015  CLINICAL DATA:  Cervical dysphagia, recent thyroid surgery 12/22/2014, cough, question aspiration EXAM: ESOPHOGRAM / BARIUM SWALLOW / BARIUM TABLET STUDY TECHNIQUE: Combined double contrast and single contrast examination performed using effervescent crystals, thick barium liquid, and thin barium liquid. The patient was observed with fluoroscopy swallowing a 13 mm barium sulphate tablet. FLUOROSCOPY TIME:  Radiation Exposure Index (as provided by the fluoroscopic device): Not provided If the device does not provide the exposure index: Fluoroscopy Time:  2 minutes 18 seconds Number of Acquired Images: 18 plus multiple screen captures during fluoroscopy COMPARISON:  None FINDINGS: Normal esophageal distention. No esophageal mass or stricture. 12.5 mm diameter barium tablet passed from oral cavity to stomach without delay. No persistent intraluminal filling defects or hiatal hernia identified.  Laryngeal penetration with aspiration of a small amount of contrast into proximal trachea. No definite spontaneous cough reflex. Smooth appearance of esophageal mucosa without irregularity or ulceration. IMPRESSION: Laryngeal penetration and silent aspiration of a small amount of contrast material. No evidence of esophageal mass or stricture. Electronically Signed   By: MLavonia DanaM.D.   On: 01/01/2015 12:32    [facility] The patient would be a good candidate for whole brain radiation.    Plan: We discussed the possible  side effects and risks of treatment in addition to the possible benefits of treatment. We discussed the protocol for radiation treatment.  All of the patient's questions were answered. The patient does wish to proceed with this treatment. A simulation will be scheduled such that we can proceed with treatment planning.  I spent 15 minutes with the patient today, the majority of which was spent counseling the patient on the diagnosis of cancer and coordinating care.   ------------------------------------------------  Jodelle Gross, MD, PhD  This document serves as a record of services personally performed by Kyung Rudd, MD. It was created on his behalf by Derek Mound, a trained medical scribe. The creation of this record is based on the scribe's personal observations and the provider's statements to them. This document has been checked and approved by the attending provider.    Addendum I have spoken to Dr. Koleen Nimrod at [facility] that this [facility]. She feels that postoperative treatment to the neck bilaterally would also be of significant benefit given the extent of the patient's disease and her degree of discomfort previously. I discussed with her that such a treatment could be given concurrently with whole brain radiation treatment. We also discussed that the patient does have additional disease and this  would strictly be palliative in nature. I would therefore anticipate a 3 week course of treatment. She advocated continuing her systemic treatment during radiotherapy.

## 2015-01-27 NOTE — Progress Notes (Signed)
Haley Lowe here for follow up.  She reports pain in her right ear due to swelling at a 2/10.  She is taking morphine 60 mg bid and oxycodone 20 mg q 4 hours.  Her endocrinologist would like a call. Her name is Dr. Rodney Booze.  Her pager is [phone] and her cell is [phone].  She would like to discuss whether treatment to her neck.    She denies trouble with her balance.  She reports having "black spider webs" in her right vision.  She was told this was floaters.  She reports occasional nausea.  She is not taking decadron.  BP 142/93 mmHg  Pulse 66  Temp(Src) 98.1 F (36.7 C) (Oral)  Resp 18  Ht '5\' 3"'$  (1.6 m)  Wt 158 lb 6.4 oz (71.85 kg)  BMI 28.07 kg/m2  SpO2 97%

## 2015-01-29 ENCOUNTER — Telehealth: Payer: Self-pay | Admitting: Radiation Therapy

## 2015-01-29 ENCOUNTER — Ambulatory Visit: Payer: Medicare Other | Admitting: Radiation Oncology

## 2015-01-29 NOTE — Telephone Encounter (Signed)
Haley Lowe left me a voicemail stating that Dr. Lisbeth Renshaw mentioned giving her an Rx for steroids during her last visit, but there has not been anything called into her pharmacy. She questioned if this was forgotten or if he changed his mind and decided against it.   I will route this message to Dr. Lisbeth Renshaw and his nurse to see how he would like to proceed.   Mont Dutton

## 2015-01-30 MED ORDER — DEXAMETHASONE 4 MG PO TABS: 4.0000 mg | ORAL_TABLET | Freq: Two times a day (BID) | ORAL | Status: DC

## 2015-01-31 ENCOUNTER — Ambulatory Visit
Admission: RE | Admit: 2015-01-31 | Discharge: 2015-01-31 | Disposition: A | Discharge status: Home / Self Care | Payer: Medicare Other | Source: Ambulatory Visit | Attending: Radiation Oncology | Admitting: Radiation Oncology

## 2015-01-31 DIAGNOSIS — C7931 Secondary malignant neoplasm of brain: Secondary | ICD-10-CM | POA: Diagnosis present

## 2015-01-31 DIAGNOSIS — C801 Malignant (primary) neoplasm, unspecified: Secondary | ICD-10-CM | POA: Insufficient documentation

## 2015-01-31 DIAGNOSIS — Z510 Encounter for antineoplastic radiation therapy: Secondary | ICD-10-CM | POA: Diagnosis present

## 2015-02-05 ENCOUNTER — Ambulatory Visit: Payer: Medicare Other | Admitting: Radiation Oncology

## 2015-02-05 ENCOUNTER — Ambulatory Visit: Payer: Medicare Other

## 2015-02-06 ENCOUNTER — Ambulatory Visit: Payer: Medicare Other

## 2015-02-06 ENCOUNTER — Ambulatory Visit
Admission: RE | Admit: 2015-02-06 | Discharge: 2015-02-06 | Disposition: A | Discharge status: Home / Self Care | Payer: Medicare Other | Source: Ambulatory Visit | Attending: Radiation Oncology | Admitting: Radiation Oncology

## 2015-02-06 DIAGNOSIS — Z510 Encounter for antineoplastic radiation therapy: Secondary | ICD-10-CM | POA: Diagnosis not present

## 2015-02-07 ENCOUNTER — Ambulatory Visit
Admission: RE | Admit: 2015-02-07 | Discharge: 2015-02-07 | Disposition: A | Discharge status: Home / Self Care | Payer: Medicare Other | Source: Ambulatory Visit | Attending: Radiation Oncology | Admitting: Radiation Oncology

## 2015-02-07 ENCOUNTER — Ambulatory Visit: Payer: Medicare Other

## 2015-02-07 ENCOUNTER — Encounter: Payer: Self-pay | Admitting: Radiation Oncology

## 2015-02-07 VITALS — BP 131/89 | HR 64 | Temp 98.2°F | Ht 62.0 in | Wt 152.2 lb

## 2015-02-07 DIAGNOSIS — Z510 Encounter for antineoplastic radiation therapy: Secondary | ICD-10-CM | POA: Diagnosis not present

## 2015-02-07 DIAGNOSIS — C7931 Secondary malignant neoplasm of brain: Secondary | ICD-10-CM

## 2015-02-07 NOTE — Progress Notes (Signed)
Haley Lowe is here for her 2nd fraction of radiation to her brain/upper neck. She complains of itching to her arms, head, face and neck which started after radiation yesterday. She also as a "lump" on the right side of her neck which she noticed had appeared last night after her radiation treatment yesterday. She has several questions regarding the use of vaseline to her surgery scar around her neck, and the use of a cool cloth around her neck at times. She reports feeling tired after her radiation treatment yesterday, and reports she is eating well.  BP 131/89 mmHg  Pulse 64  Temp(Src) 98.2 F (36.8 C)  Ht '5\' 2"'$  (1.575 m)  Wt 152 lb 3.2 oz (69.037 kg)  BMI 27.83 kg/m2   Wt Readings from Last 3 Encounters:  02/07/15 152 lb 3.2 oz (69.037 kg)  01/27/15 158 lb 6.4 oz (71.85 kg)  12/06/14 148 lb 2 oz (67.189 kg)

## 2015-02-07 NOTE — Progress Notes (Signed)
Department of Radiation Oncology  Phone:  [phone] Fax:        [phone]  Weekly Treatment Note    Name: Haley Lowe Date: 02/07/2015 MRN: [id number] DOB: 1955/12/31   Current dose: 5 Gy  Current fraction: 2   MEDICATIONS: Current Outpatient Prescriptions  Medication Sig Dispense Refill  . ALPRAZolam (XANAX) 0.5 MG tablet Take 0.5 mg by mouth 3 (three) times daily as needed for anxiety.     Marland Kitchen dexamethasone (DECADRON) 4 MG tablet Take 1 tablet (4 mg total) by mouth 2 (two) times daily. 60 tablet 0  . docusate sodium (COLACE) 100 MG capsule Take 100 mg by mouth 2 (two) times daily.    Marland Kitchen escitalopram (LEXAPRO) 5 MG tablet Take 5 mg by mouth.    Marland Kitchen ibuprofen (ADVIL,MOTRIN) 800 MG tablet Take 400-800 mg by mouth every 8 (eight) hours as needed for pain.     Marland Kitchen levothyroxine (SYNTHROID, LEVOTHROID) 100 MCG tablet Take 100 mcg by mouth daily before breakfast.    . levothyroxine (SYNTHROID, LEVOTHROID) 75 MCG tablet Take 37.5 tablets by mouth daily.     . Magnesium 250 MG TABS Take 1 tablet by mouth daily.    Marland Kitchen morphine (MS CONTIN) 60 MG 12 hr tablet Take 60 mg by mouth every 12 (twelve) hours.    Marland Kitchen omeprazole (PRILOSEC) 40 MG capsule Take 1 capsule (40 mg total) by mouth daily. 90 capsule 3  . ondansetron (ZOFRAN ODT) 8 MG disintegrating tablet Take 1 tablet (8 mg total) by mouth every 8 (eight) hours as needed for nausea or vomiting. 20 tablet 0  . oxycodone (OXY-IR) 5 MG capsule Take 5 mg by mouth every 4 (four) hours as needed. Patient reports she is taking '20mg'$  every 4 hours as needed for pain, per MD order    . polyethylene glycol (MIRALAX / GLYCOLAX) packet Take 17 g by mouth every other day. For constipation    . Simethicone (GAS-X EXTRA STRENGTH PO) Take 1 tablet by mouth daily as needed (gas relief).    Marland Kitchen zolpidem (AMBIEN) 10 MG tablet Take 10 mg by mouth at bedtime as needed for sleep.     No current facility-administered medications for this encounter.      ALLERGIES: Penicillins; Other; Codeine; and Tape   LABORATORY DATA:  Lab Results  Component Value Date   WBC 8.6 12/02/2014   HGB 15.2* 12/02/2014   HCT 45.8 12/02/2014   MCV 88.4 12/02/2014   PLT 227 12/02/2014   Lab Results  Component Value Date   NA 134* 12/02/2014   K 4.5 12/02/2014   CL 99* 12/02/2014   CO2 27 12/02/2014   Lab Results  Component Value Date   ALT 20 07/28/2014   AST 26 07/28/2014   ALKPHOS 111 07/28/2014   BILITOT 0.8 07/28/2014     NARRATIVE: Haley Lowe was seen today for weekly treatment management. The chart was checked and the patient's films were reviewed. She complains of itching to her arms, head, face and neck which started after radiation yesterday. She expressed concern about a "lump" on the right side of her neck. She noticed this last night after her radiation treatment. She has several questions regarding the use of vaseline to her scar from surgery located around her neck. The patient confirms symptoms of fatigue. However, she states that her appetite has not changed and that she is still eating. The patient projected a healthy mental status and was accompanied by her husband and daughter.  PHYSICAL EXAMINATION: height is '5\' 2"'$  (1.575 m) and weight is 152 lb 3.2 oz (69.037 kg). Her temperature is 98.2 F (36.8 C). Her blood pressure is 131/89 and her pulse is 64.  The patient is alert and oriented. There is no significant changes to the status of overall health to be noted at this time.  ASSESSMENT: Haley Lowe is a 59 year old female presenting to clinic in regards to her metastatic adenocarcinoma with brain metastasis. The patient is doing satisfactorily with treatment. She is managing reported symptoms appropriately. The patient understands that she can access her appointments and medical records via [place].   PLAN: We will continue with the patient's radiation treatment as planned. Healthy methods of management in regards to  reported symptoms were reviewed. All vocalized questions and concerns have been addressed. If the patient develops any further questions or concerns in regards to her treatment and recovery, she has been encouraged to contact Dr. Lisbeth Renshaw, MD. She is aware of her follow-up appointment to take place next week, as scheduled.   This document serves as a record of services personally performed by Kyung Rudd, MD. It was created on his behalf by Lenn Cal, a trained medical scribe. The creation of this record is based on the scribe's personal observations and the provider's statements to them. This document has been checked and approved by the attending provider. ------------------------------------------------  Jodelle Gross, MD, PhD

## 2015-02-10 ENCOUNTER — Ambulatory Visit
Admission: RE | Admit: 2015-02-10 | Discharge: 2015-02-10 | Disposition: A | Discharge status: Home / Self Care | Payer: Medicare Other | Source: Ambulatory Visit | Attending: Radiation Oncology | Admitting: Radiation Oncology

## 2015-02-10 ENCOUNTER — Ambulatory Visit: Payer: Medicare Other

## 2015-02-10 DIAGNOSIS — Z510 Encounter for antineoplastic radiation therapy: Secondary | ICD-10-CM | POA: Diagnosis not present

## 2015-02-11 ENCOUNTER — Ambulatory Visit
Admission: RE | Admit: 2015-02-11 | Discharge: 2015-02-11 | Disposition: A | Discharge status: Home / Self Care | Payer: Medicare Other | Source: Ambulatory Visit | Attending: Radiation Oncology | Admitting: Radiation Oncology

## 2015-02-11 ENCOUNTER — Ambulatory Visit: Payer: Medicare Other

## 2015-02-11 DIAGNOSIS — Z510 Encounter for antineoplastic radiation therapy: Secondary | ICD-10-CM | POA: Diagnosis not present

## 2015-02-12 ENCOUNTER — Ambulatory Visit: Payer: Medicare Other

## 2015-02-12 ENCOUNTER — Ambulatory Visit
Admission: RE | Admit: 2015-02-12 | Discharge: 2015-02-12 | Disposition: A | Discharge status: Home / Self Care | Payer: Medicare Other | Source: Ambulatory Visit | Attending: Radiation Oncology | Admitting: Radiation Oncology

## 2015-02-12 DIAGNOSIS — Z510 Encounter for antineoplastic radiation therapy: Secondary | ICD-10-CM | POA: Diagnosis not present

## 2015-02-13 ENCOUNTER — Ambulatory Visit
Admission: RE | Admit: 2015-02-13 | Discharge: 2015-02-13 | Disposition: A | Discharge status: Home / Self Care | Payer: Medicare Other | Source: Ambulatory Visit | Attending: Radiation Oncology | Admitting: Radiation Oncology

## 2015-02-13 ENCOUNTER — Ambulatory Visit: Payer: Medicare Other

## 2015-02-13 DIAGNOSIS — Z510 Encounter for antineoplastic radiation therapy: Secondary | ICD-10-CM | POA: Diagnosis not present

## 2015-02-14 ENCOUNTER — Ambulatory Visit
Admission: RE | Admit: 2015-02-14 | Discharge: 2015-02-14 | Disposition: A | Discharge status: Home / Self Care | Payer: Medicare Other | Source: Ambulatory Visit | Attending: Radiation Oncology | Admitting: Radiation Oncology

## 2015-02-14 ENCOUNTER — Ambulatory Visit: Payer: Medicare Other

## 2015-02-14 ENCOUNTER — Encounter: Payer: Self-pay | Admitting: Radiation Oncology

## 2015-02-14 VITALS — BP 129/98 | HR 72 | Temp 98.4°F | Resp 16 | Wt 149.6 lb

## 2015-02-14 DIAGNOSIS — Z510 Encounter for antineoplastic radiation therapy: Secondary | ICD-10-CM | POA: Diagnosis not present

## 2015-02-14 DIAGNOSIS — C7931 Secondary malignant neoplasm of brain: Secondary | ICD-10-CM

## 2015-02-14 MED ORDER — SONAFINE EX EMUL
1.0000 "application " | Freq: Two times a day (BID) | CUTANEOUS | Status: DC
  Administered 2015-02-14: 1 via TOPICAL
  Filled 2015-02-14: qty 45

## 2015-02-14 NOTE — Progress Notes (Signed)
Department of Radiation Oncology  Phone:  [phone] Fax:        [phone]  Weekly Treatment Note    Name: Haley Lowe Date: 02/14/2015 MRN: [id number] DOB: June 11, 1955   Current dose: 17.5 Gy  Current fraction: 7   MEDICATIONS: Current Outpatient Prescriptions  Medication Sig Dispense Refill  . ALPRAZolam (XANAX) 0.5 MG tablet Take 0.5 mg by mouth 3 (three) times daily as needed for anxiety.     Marland Kitchen amitriptyline (ELAVIL) 25 MG tablet Take 25 mg by mouth at bedtime.    Marland Kitchen dexamethasone (DECADRON) 4 MG tablet Take 1 tablet (4 mg total) by mouth 2 (two) times daily. 60 tablet 0  . docusate sodium (COLACE) 100 MG capsule Take 100 mg by mouth 2 (two) times daily.    Marland Kitchen ibuprofen (ADVIL,MOTRIN) 800 MG tablet Take 400-800 mg by mouth every 8 (eight) hours as needed for pain.     Marland Kitchen levothyroxine (SYNTHROID, LEVOTHROID) 100 MCG tablet Take 100 mcg by mouth daily before breakfast.    . Magnesium 250 MG TABS Take 1 tablet by mouth daily.    Marland Kitchen morphine (MS CONTIN) 60 MG 12 hr tablet Take 60 mg by mouth every 12 (twelve) hours.    Marland Kitchen omeprazole (PRILOSEC) 40 MG capsule Take 1 capsule (40 mg total) by mouth daily. 90 capsule 3  . ondansetron (ZOFRAN ODT) 8 MG disintegrating tablet Take 1 tablet (8 mg total) by mouth every 8 (eight) hours as needed for nausea or vomiting. 20 tablet 0  . oxycodone (OXY-IR) 5 MG capsule Take 5 mg by mouth every 4 (four) hours as needed. Patient reports she is taking '20mg'$  every 4 hours as needed for pain, per MD order    . polyethylene glycol (MIRALAX / GLYCOLAX) packet Take 17 g by mouth every other day. For constipation    . Simethicone (GAS-X EXTRA STRENGTH PO) Take 1 tablet by mouth daily as needed (gas relief).    . Wound Dressings (SONAFINE) Apply 1 application topically daily.    Marland Kitchen levothyroxine (SYNTHROID, LEVOTHROID) 75 MCG tablet Take 37.5 tablets by mouth daily.      Current Facility-Administered Medications  Medication Dose Route Frequency  Provider Last Rate Last Dose  . SONAFINE emulsion 1 application  1 application Topical BID Kyung Rudd, MD   1 application at 81/82/99 1202     ALLERGIES: Penicillins; Other; Codeine; and Tape   LABORATORY DATA:  Lab Results  Component Value Date   WBC 8.6 12/02/2014   HGB 15.2* 12/02/2014   HCT 45.8 12/02/2014   MCV 88.4 12/02/2014   PLT 227 12/02/2014   Lab Results  Component Value Date   NA 134* 12/02/2014   K 4.5 12/02/2014   CL 99* 12/02/2014   CO2 27 12/02/2014   Lab Results  Component Value Date   ALT 20 07/28/2014   AST 26 07/28/2014   ALKPHOS 111 07/28/2014   BILITOT 0.8 07/28/2014     NARRATIVE: [name] was seen today for weekly treatment management. The chart was checked and the patient's films were reviewed.  Weekly rad txs Brain/upper neck, 7/15, gave sonafine cream, apply to scalp and neck daily after radiation, use baby shampoo every other day   Discussed ways to manage side effects skin irritation, fatigue,pain, nausea, , no pain stated today, started amitriptyline last night, which helped, no nausea, or head aches stated, appetite good 5:46 PM BP 129/98 mmHg  Pulse 72  Temp(Src) 98.4 F (36.9 C) (Oral)  Resp 16  Wt 149 lb 9.6 oz (67.858 kg)  SpO2 99%  Wt Readings from Last 3 Encounters:  02/14/15 149 lb 9.6 oz (67.858 kg)  02/07/15 152 lb 3.2 oz (69.037 kg)  01/27/15 158 lb 6.4 oz (71.85 kg)    PHYSICAL EXAMINATION: weight is 149 lb 9.6 oz (67.858 kg). Her oral temperature is 98.4 F (36.9 C). Her blood pressure is 129/98 and her pulse is 72. Her respiration is 16 and oxygen saturation is 99%.      no significant skin change from radiation currently, no desquamation  ASSESSMENT: The patient is doing satisfactorily with treatment.  The patient is feeling much better today. She currently is on Decadron 4 mg twice a day and we will continue this current dose. Instructions for tapering steroids will be given as she proceeds through  treatment.  PLAN: We will continue with the patient's radiation treatment as planned.

## 2015-02-14 NOTE — Progress Notes (Signed)
Weekly rad txs Brain/upper neck, 7/15, gave sonafine cream, apply to scalp and neck daily after radiation, use baby shampoo every other day   Discussed ways to manage side effects skin irritation, fatigue,pain, nausea, , no pain stated today, started amitriptyline last night, which helped, no nausea, or head aches stated, appetite good 12:04 PM BP 129/98 mmHg  Pulse 72  Temp(Src) 98.4 F (36.9 C) (Oral)  Resp 16  Wt 149 lb 9.6 oz (67.858 kg)  SpO2 99%  Wt Readings from Last 3 Encounters:  02/14/15 149 lb 9.6 oz (67.858 kg)  02/07/15 152 lb 3.2 oz (69.037 kg)  01/27/15 158 lb 6.4 oz (71.85 kg)

## 2015-02-17 ENCOUNTER — Ambulatory Visit: Payer: Medicare Other

## 2015-02-17 ENCOUNTER — Ambulatory Visit
Admission: RE | Admit: 2015-02-17 | Discharge: 2015-02-17 | Disposition: A | Discharge status: Home / Self Care | Payer: Medicare Other | Source: Ambulatory Visit | Attending: Radiation Oncology | Admitting: Radiation Oncology

## 2015-02-17 DIAGNOSIS — Z510 Encounter for antineoplastic radiation therapy: Secondary | ICD-10-CM | POA: Diagnosis not present

## 2015-02-18 ENCOUNTER — Ambulatory Visit: Payer: Medicare Other

## 2015-02-18 ENCOUNTER — Ambulatory Visit
Admission: RE | Admit: 2015-02-18 | Discharge: 2015-02-18 | Disposition: A | Discharge status: Home / Self Care | Payer: Medicare Other | Source: Ambulatory Visit | Attending: Radiation Oncology | Admitting: Radiation Oncology

## 2015-02-18 DIAGNOSIS — Z510 Encounter for antineoplastic radiation therapy: Secondary | ICD-10-CM | POA: Diagnosis not present

## 2015-02-19 ENCOUNTER — Ambulatory Visit
Admission: RE | Admit: 2015-02-19 | Discharge: 2015-02-19 | Disposition: A | Discharge status: Home / Self Care | Payer: Medicare Other | Source: Ambulatory Visit | Attending: Radiation Oncology | Admitting: Radiation Oncology

## 2015-02-19 ENCOUNTER — Ambulatory Visit: Payer: Medicare Other

## 2015-02-19 DIAGNOSIS — Z510 Encounter for antineoplastic radiation therapy: Secondary | ICD-10-CM | POA: Diagnosis not present

## 2015-02-20 ENCOUNTER — Ambulatory Visit: Payer: Medicare Other

## 2015-02-20 ENCOUNTER — Ambulatory Visit
Admission: RE | Admit: 2015-02-20 | Discharge: 2015-02-20 | Disposition: A | Discharge status: Home / Self Care | Payer: Medicare Other | Source: Ambulatory Visit | Attending: Radiation Oncology | Admitting: Radiation Oncology

## 2015-02-20 DIAGNOSIS — Z510 Encounter for antineoplastic radiation therapy: Secondary | ICD-10-CM | POA: Diagnosis not present

## 2015-02-21 ENCOUNTER — Ambulatory Visit
Admission: RE | Admit: 2015-02-21 | Discharge: 2015-02-21 | Disposition: A | Discharge status: Home / Self Care | Payer: Medicare Other | Source: Ambulatory Visit | Attending: Radiation Oncology | Admitting: Radiation Oncology

## 2015-02-21 ENCOUNTER — Ambulatory Visit: Payer: Medicare Other

## 2015-02-21 ENCOUNTER — Encounter: Payer: Self-pay | Admitting: Radiation Oncology

## 2015-02-21 VITALS — BP 138/97 | HR 80 | Temp 98.0°F | Ht 62.0 in | Wt 155.1 lb

## 2015-02-21 DIAGNOSIS — C7931 Secondary malignant neoplasm of brain: Secondary | ICD-10-CM

## 2015-02-21 DIAGNOSIS — Z510 Encounter for antineoplastic radiation therapy: Secondary | ICD-10-CM | POA: Diagnosis not present

## 2015-02-21 NOTE — Progress Notes (Signed)
  Radiation Oncology         [phone] ________________________________  Name: Haley Lowe MRN: [id number]  Date: 01/31/2015  DOB: [birth year]  SIMULATION AND TREATMENT PLANNING NOTE  DIAGNOSIS:  Metastatic thyroid cancer  Site:  Brain/ bilateral neck  NARRATIVE:  The patient was brought to the [facility].  Identity was confirmed.  All relevant records and images related to the planned course of therapy were reviewed.   Written consent to proceed with treatment was confirmed which was freely given after reviewing the details related to the planned course of therapy had been reviewed with the patient.  Then, the patient was set-up in a stable reproducible  supine position for radiation therapy.  CT images were obtained.  Surface markings were placed.    Medically necessary complex treatment device(s) for immobilization: thermoplastic headcast.   The CT images were loaded into the planning software.  Then the target and avoidance structures were contoured.  Treatment planning then occurred.  The radiation prescription was entered and confirmed.  I have requested : Intensity Modulated Radiotherapy (IMRT) is medically necessary for this case for the following reason:  Adequate radiation dose to the target structures while appropriately sparing adjacent normal structures including the cord, larynx, parotid glands.   The patient will undergo daily image guidance to ensure accurate localization of the target, and adequate minimize dose to the normal surrounding structures in close proximity to the target.  PLAN:  The patient will receive 37.5 Gy in 15 fractions.   Special treatment procedure The patient will also receive concurrent chemotherapy during the treatment. The patient may therefore experience increased toxicity or side effects and the patient will be monitored for such problems. This may require extra lab work as necessary. This therefore constitutes a special  treatment procedure.  ________________________________   Jodelle Gross, MD, PhD

## 2015-02-21 NOTE — Progress Notes (Signed)
Department of Radiation Oncology  Phone:  [phone] Fax:        [phone]  Weekly Treatment Note    Name: Haley Lowe Date: 02/21/2015 MRN: [id number] DOB: [birth year]   Current dose: 30 Gy  Current fraction: 12   MEDICATIONS: Current Outpatient Prescriptions  Medication Sig Dispense Refill  . ALPRAZolam (XANAX) 0.5 MG tablet Take 0.5 mg by mouth 3 (three) times daily as needed for anxiety.     Marland Kitchen amitriptyline (ELAVIL) 25 MG tablet Take 25 mg by mouth at bedtime.    Marland Kitchen dexamethasone (DECADRON) 4 MG tablet Take 1 tablet (4 mg total) by mouth 2 (two) times daily. 60 tablet 0  . docusate sodium (COLACE) 100 MG capsule Take 100 mg by mouth 2 (two) times daily.    Marland Kitchen ibuprofen (ADVIL,MOTRIN) 800 MG tablet Take 400-800 mg by mouth every 8 (eight) hours as needed for pain.     Marland Kitchen Lenvatinib 14 MG Daily Dose (LENVIMA 14 MG DAILY DOSE) 10 & 4 MG CPPK Take 14 mg by mouth.    . levothyroxine (SYNTHROID, LEVOTHROID) 100 MCG tablet Take 100 mcg by mouth daily before breakfast.    . levothyroxine (SYNTHROID, LEVOTHROID) 75 MCG tablet Take 37.5 tablets by mouth daily.     . Magnesium 250 MG TABS Take 1 tablet by mouth daily.    Marland Kitchen morphine (MS CONTIN) 60 MG 12 hr tablet Take 60 mg by mouth every 12 (twelve) hours.    Marland Kitchen omeprazole (PRILOSEC) 40 MG capsule Take 1 capsule (40 mg total) by mouth daily. 90 capsule 3  . ondansetron (ZOFRAN ODT) 8 MG disintegrating tablet Take 1 tablet (8 mg total) by mouth every 8 (eight) hours as needed for nausea or vomiting. 20 tablet 0  . oxycodone (OXY-IR) 5 MG capsule Take 5 mg by mouth every 4 (four) hours as needed. Patient reports she is taking '20mg'$  every 4 hours as needed for pain, per MD order    . polyethylene glycol (MIRALAX / GLYCOLAX) packet Take 17 g by mouth every other day. For constipation    . Simethicone (GAS-X EXTRA STRENGTH PO) Take 1 tablet by mouth daily as needed (gas relief).    . Wound Dressings (SONAFINE) Apply 1 application  topically daily.     No current facility-administered medications for this encounter.     ALLERGIES: Penicillins; Other; Codeine; and Tape   LABORATORY DATA:  Lab Results  Component Value Date   WBC 8.6 12/02/2014   HGB 15.2* 12/02/2014   HCT 45.8 12/02/2014   MCV 88.4 12/02/2014   PLT 227 12/02/2014   Lab Results  Component Value Date   NA 134* 12/02/2014   K 4.5 12/02/2014   CL 99* 12/02/2014   CO2 27 12/02/2014   Lab Results  Component Value Date   ALT 20 07/28/2014   AST 26 07/28/2014   ALKPHOS 111 07/28/2014   BILITOT 0.8 07/28/2014     NARRATIVE: [name] was seen today for weekly treatment management. The chart was checked and the patient's films were reviewed. The patient denies having any headaches. She mentions that her mouth and tongue are sore and her tongue has an open area on the inside of her cheek. She is using Nystatin and Magic mouthwash but isn't sure which one she should be using. I recommend that she uses the Magic mouthwash instead of the Nystatin, but she can't swallow the Magic mouthwash. I mentioned Carafate to her, but she does not like it.  She has been having some swallowing problems since the last visit. She has had some swelling in her legs. She mentions that her energy level is low.   2:51 PM BP 138/97 mmHg  Pulse 80  Temp(Src) 98 F (36.7 C) (Oral)  Ht '5\' 2"'$  (1.575 m)  Wt 155 lb 1.6 oz (70.353 kg)  BMI 28.36 kg/m2  SpO2 97%  Wt Readings from Last 3 Encounters:  02/21/15 155 lb 1.6 oz (70.353 kg)  02/14/15 149 lb 9.6 oz (67.858 kg)  02/07/15 152 lb 3.2 oz (69.037 kg)    PHYSICAL EXAMINATION: height is '5\' 2"'$  (1.575 m) and weight is 155 lb 1.6 oz (70.353 kg). Her oral temperature is 98 F (36.7 C). Her blood pressure is 138/97 and her pulse is 80. Her oxygen saturation is 97%.  No significant skin change from radiation currently. No desquamation.  No thrush present. Patient has some irritation of the tongue  interiorly.  ASSESSMENT: The patient is doing satisfactorily with treatment. The patient is feeling much better today. She currently is on Decadron 4 mg twice a day and she will start to taper that dose.  PLAN: We will continue with the patient's radiation treatment as planned. She will follow up with me in a month. I have given her instructions for the taper of the Decadron.       This document serves as a record of services personally performed by Kyung Rudd, MD. It was created on his behalf by  Lendon Collar, a trained medical scribe. The creation of this record is based on the scribe's personal observations and the provider's statements to them. This document has been checked and approved by the attending provider.

## 2015-02-21 NOTE — Patient Instructions (Signed)
Take decadron x2 per day x 7 days, then x1 per day for 7 days, then 1/2 tablet x1 per day x 7 days.

## 2015-02-21 NOTE — Progress Notes (Signed)
Mrs. Bannister has received 12 fractions.  Denies having any headaches,fine motor movement, blurred vision or memory change.  Reports that mouth and tongue are sore and tongue has an open area and inside of cheek.  Using Nystatin and Magic mouthwash not sure which one she should be using.  Having some swallowing problems since last visit.  Skin pigmentation looks good.  Energy level is low.

## 2015-02-24 ENCOUNTER — Ambulatory Visit
Admission: RE | Admit: 2015-02-24 | Discharge: 2015-02-24 | Disposition: A | Discharge status: Home / Self Care | Payer: Medicare Other | Source: Ambulatory Visit | Attending: Radiation Oncology | Admitting: Radiation Oncology

## 2015-02-24 ENCOUNTER — Ambulatory Visit: Payer: Medicare Other

## 2015-02-24 DIAGNOSIS — Z510 Encounter for antineoplastic radiation therapy: Secondary | ICD-10-CM | POA: Diagnosis not present

## 2015-02-25 ENCOUNTER — Ambulatory Visit
Admission: RE | Admit: 2015-02-25 | Discharge: 2015-02-25 | Disposition: A | Discharge status: Home / Self Care | Payer: Medicare Other | Source: Ambulatory Visit | Attending: Radiation Oncology | Admitting: Radiation Oncology

## 2015-02-25 ENCOUNTER — Ambulatory Visit: Payer: Medicare Other

## 2015-02-25 ENCOUNTER — Telehealth: Payer: Self-pay | Admitting: *Deleted

## 2015-02-25 ENCOUNTER — Encounter: Payer: Self-pay | Admitting: Radiation Oncology

## 2015-02-25 VITALS — BP 140/87 | HR 80 | Temp 97.9°F | Ht 62.0 in | Wt 151.0 lb

## 2015-02-25 DIAGNOSIS — C801 Malignant (primary) neoplasm, unspecified: Secondary | ICD-10-CM | POA: Diagnosis present

## 2015-02-25 DIAGNOSIS — Z510 Encounter for antineoplastic radiation therapy: Secondary | ICD-10-CM | POA: Diagnosis present

## 2015-02-25 DIAGNOSIS — C7931 Secondary malignant neoplasm of brain: Secondary | ICD-10-CM | POA: Diagnosis present

## 2015-02-25 MED ORDER — MAGIC MOUTHWASH W/LIDOCAINE: 5.0000 mL | Freq: Four times a day (QID) | ORAL | Status: DC | PRN

## 2015-02-25 MED ORDER — DEXAMETHASONE 4 MG PO TABS: 4.0000 mg | ORAL_TABLET | Freq: Two times a day (BID) | ORAL | Status: DC

## 2015-02-25 NOTE — Progress Notes (Signed)
Haley Lowe demonstrates increased fatigue as evidence by napping in exam room.  She reports increasing pain upon swallowing and c/o pain as a level 10.  Note mucositis on the tip of her tongue and in the left, posterior gum line.  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

## 2015-02-25 NOTE — Progress Notes (Signed)
Department of Radiation Oncology  Phone:  [phone] Fax:        [phone]  Weekly Treatment Note    Name: Haley Lowe Date: 02/25/2015 MRN: [id number] DOB: [birth year]   Current dose: 35 Gy  Current fraction: 14   MEDICATIONS: Current Outpatient Prescriptions  Medication Sig Dispense Refill  . ALPRAZolam (XANAX) 0.5 MG tablet Take 0.5 mg by mouth 3 (three) times daily as needed for anxiety.     Marland Kitchen amitriptyline (ELAVIL) 25 MG tablet Take 25 mg by mouth at bedtime.    Marland Kitchen dexamethasone (DECADRON) 4 MG tablet Take 1 tablet (4 mg total) by mouth 2 (two) times daily. 60 tablet 0  . docusate sodium (COLACE) 100 MG capsule Take 100 mg by mouth 2 (two) times daily.    Marland Kitchen ibuprofen (ADVIL,MOTRIN) 800 MG tablet Take 400-800 mg by mouth every 8 (eight) hours as needed for pain.     Marland Kitchen Lenvatinib 14 MG Daily Dose (LENVIMA 14 MG DAILY DOSE) 10 & 4 MG CPPK Take 14 mg by mouth.    . levothyroxine (SYNTHROID, LEVOTHROID) 100 MCG tablet Take 100 mcg by mouth daily before breakfast.    . levothyroxine (SYNTHROID, LEVOTHROID) 75 MCG tablet Take 37.5 tablets by mouth daily.     . Magnesium 250 MG TABS Take 1 tablet by mouth daily.    Marland Kitchen morphine (MS CONTIN) 60 MG 12 hr tablet Take 60 mg by mouth every 12 (twelve) hours.    Marland Kitchen omeprazole (PRILOSEC) 40 MG capsule Take 1 capsule (40 mg total) by mouth daily. 90 capsule 3  . ondansetron (ZOFRAN ODT) 8 MG disintegrating tablet Take 1 tablet (8 mg total) by mouth every 8 (eight) hours as needed for nausea or vomiting. 20 tablet 0  . oxycodone (OXY-IR) 5 MG capsule Take 5 mg by mouth every 4 (four) hours as needed. Patient reports she is taking '20mg'$  every 4 hours as needed for pain, per MD order    . pilocarpine (SALAGEN) 5 MG tablet Take 5 mg by mouth 3 (three) times daily.    . polyethylene glycol (MIRALAX / GLYCOLAX) packet Take 17 g by mouth every other day. For constipation    . Simethicone (GAS-X EXTRA STRENGTH PO) Take 1 tablet by mouth daily as  needed (gas relief).    . Wound Dressings (SONAFINE) Apply 1 application topically daily.     No current facility-administered medications for this encounter.     ALLERGIES: Penicillins; Other; Codeine; and Tape   LABORATORY DATA:  Lab Results  Component Value Date   WBC 8.6 12/02/2014   HGB 15.2* 12/02/2014   HCT 45.8 12/02/2014   MCV 88.4 12/02/2014   PLT 227 12/02/2014   Lab Results  Component Value Date   NA 134* 12/02/2014   K 4.5 12/02/2014   CL 99* 12/02/2014   CO2 27 12/02/2014   Lab Results  Component Value Date   ALT 20 07/28/2014   AST 26 07/28/2014   ALKPHOS 111 07/28/2014   BILITOT 0.8 07/28/2014     NARRATIVE: Haley Lowe was seen today for weekly treatment management. The chart was checked and the patient's films were reviewed.  Haley Lowe demonstrates increased fatigue as evidence by napping in exam room.  She reports increasing pain upon swallowing and c/o pain as a level 10.  Note mucositis on the tip of her tongue and in the left, posterior gum line.  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  PHYSICAL EXAMINATION: height  is '5\' 2"'$  (1.575 m) and weight is 151 lb (68.493 kg). Her temperature is 97.9 F (36.6 C). Her blood pressure is 140/87 and her pulse is 80. Her oxygen saturation is 98%.        ASSESSMENT: The patient is doing satisfactorily with treatment.  PLAN: We will continue with the patient's radiation treatment as planned. The patient has been given a prescription for Magic mouthwash with lidocaine as well as a refill for Decadron. She does have instructions present for further taper.

## 2015-02-25 NOTE — Telephone Encounter (Signed)
[facility] Linna Hoff, spoke with Trip, Pharmacist, rx for MMW to give to patient: MMW with lidocaine soln: 26m by mouth 4x day as needed for mouth pain, quantity:4061m no refills, Trip will call patient when ready,thanked Pharmacist 2:43 PM'

## 2015-02-26 ENCOUNTER — Ambulatory Visit
Admission: RE | Admit: 2015-02-26 | Discharge: 2015-02-26 | Disposition: A | Discharge status: Home / Self Care | Payer: Medicare Other | Source: Ambulatory Visit | Attending: Radiation Oncology | Admitting: Radiation Oncology

## 2015-02-26 ENCOUNTER — Ambulatory Visit: Payer: Medicare Other

## 2015-02-26 ENCOUNTER — Encounter: Payer: Self-pay | Admitting: Radiation Oncology

## 2015-02-26 DIAGNOSIS — Z510 Encounter for antineoplastic radiation therapy: Secondary | ICD-10-CM | POA: Diagnosis not present

## 2015-03-02 ENCOUNTER — Encounter (HOSPITAL_COMMUNITY): Payer: Self-pay | Admitting: *Deleted

## 2015-03-02 ENCOUNTER — Encounter (HOSPITAL_COMMUNITY): Payer: Self-pay | Admitting: Cardiology

## 2015-03-02 ENCOUNTER — Inpatient Hospital Stay (HOSPITAL_COMMUNITY)
Admission: EM | Admit: 2015-03-02 | Discharge: 2015-03-13 | DRG: 871 | Disposition: A | Discharge status: Home / Self Care | Payer: Medicare Other | Attending: Family Medicine | Admitting: Family Medicine

## 2015-03-02 ENCOUNTER — Emergency Department (HOSPITAL_COMMUNITY): Payer: Medicare Other

## 2015-03-02 ENCOUNTER — Emergency Department (HOSPITAL_COMMUNITY)
Admission: EM | Admit: 2015-03-02 | Discharge: 2015-03-02 | Disposition: A | Discharge status: Home / Self Care | Payer: Medicare Other | Source: Home / Self Care | Attending: Emergency Medicine | Admitting: Emergency Medicine

## 2015-03-02 DIAGNOSIS — E876 Hypokalemia: Secondary | ICD-10-CM | POA: Diagnosis present

## 2015-03-02 DIAGNOSIS — J189 Pneumonia, unspecified organism: Secondary | ICD-10-CM | POA: Diagnosis present

## 2015-03-02 DIAGNOSIS — B0089 Other herpesviral infection: Secondary | ICD-10-CM | POA: Diagnosis present

## 2015-03-02 DIAGNOSIS — E871 Hypo-osmolality and hyponatremia: Secondary | ICD-10-CM | POA: Diagnosis present

## 2015-03-02 DIAGNOSIS — Z800 Family history of malignant neoplasm of digestive organs: Secondary | ICD-10-CM

## 2015-03-02 DIAGNOSIS — D696 Thrombocytopenia, unspecified: Secondary | ICD-10-CM | POA: Diagnosis present

## 2015-03-02 DIAGNOSIS — K1233 Oral mucositis (ulcerative) due to radiation: Secondary | ICD-10-CM | POA: Diagnosis present

## 2015-03-02 DIAGNOSIS — C73 Malignant neoplasm of thyroid gland: Secondary | ICD-10-CM | POA: Diagnosis present

## 2015-03-02 DIAGNOSIS — Z6825 Body mass index (BMI) 25.0-25.9, adult: Secondary | ICD-10-CM

## 2015-03-02 DIAGNOSIS — E43 Unspecified severe protein-calorie malnutrition: Secondary | ICD-10-CM | POA: Insufficient documentation

## 2015-03-02 DIAGNOSIS — K121 Other forms of stomatitis: Secondary | ICD-10-CM

## 2015-03-02 DIAGNOSIS — Z90710 Acquired absence of both cervix and uterus: Secondary | ICD-10-CM

## 2015-03-02 DIAGNOSIS — Z8249 Family history of ischemic heart disease and other diseases of the circulatory system: Secondary | ICD-10-CM

## 2015-03-02 DIAGNOSIS — Z820 Family history of epilepsy and other diseases of the nervous system: Secondary | ICD-10-CM

## 2015-03-02 DIAGNOSIS — Y95 Nosocomial condition: Secondary | ICD-10-CM | POA: Diagnosis present

## 2015-03-02 DIAGNOSIS — Z79891 Long term (current) use of opiate analgesic: Secondary | ICD-10-CM

## 2015-03-02 DIAGNOSIS — K22719 Barrett's esophagus with dysplasia, unspecified: Secondary | ICD-10-CM | POA: Insufficient documentation

## 2015-03-02 DIAGNOSIS — K210 Gastro-esophageal reflux disease with esophagitis: Secondary | ICD-10-CM | POA: Diagnosis present

## 2015-03-02 DIAGNOSIS — K222 Esophageal obstruction: Secondary | ICD-10-CM | POA: Diagnosis present

## 2015-03-02 DIAGNOSIS — A419 Sepsis, unspecified organism: Principal | ICD-10-CM | POA: Diagnosis present

## 2015-03-02 DIAGNOSIS — K229 Disease of esophagus, unspecified: Secondary | ICD-10-CM | POA: Insufficient documentation

## 2015-03-02 DIAGNOSIS — F419 Anxiety disorder, unspecified: Secondary | ICD-10-CM | POA: Diagnosis present

## 2015-03-02 DIAGNOSIS — Z923 Personal history of irradiation: Secondary | ICD-10-CM

## 2015-03-02 DIAGNOSIS — B3781 Candidal esophagitis: Secondary | ICD-10-CM | POA: Diagnosis present

## 2015-03-02 DIAGNOSIS — J440 Chronic obstructive pulmonary disease with acute lower respiratory infection: Secondary | ICD-10-CM | POA: Diagnosis present

## 2015-03-02 DIAGNOSIS — D72819 Decreased white blood cell count, unspecified: Secondary | ICD-10-CM

## 2015-03-02 DIAGNOSIS — E039 Hypothyroidism, unspecified: Secondary | ICD-10-CM | POA: Diagnosis present

## 2015-03-02 DIAGNOSIS — C799 Secondary malignant neoplasm of unspecified site: Secondary | ICD-10-CM | POA: Insufficient documentation

## 2015-03-02 DIAGNOSIS — J690 Pneumonitis due to inhalation of food and vomit: Secondary | ICD-10-CM | POA: Diagnosis present

## 2015-03-02 DIAGNOSIS — Z87891 Personal history of nicotine dependence: Secondary | ICD-10-CM

## 2015-03-02 DIAGNOSIS — T451X5A Adverse effect of antineoplastic and immunosuppressive drugs, initial encounter: Secondary | ICD-10-CM | POA: Diagnosis present

## 2015-03-02 DIAGNOSIS — K219 Gastro-esophageal reflux disease without esophagitis: Secondary | ICD-10-CM | POA: Insufficient documentation

## 2015-03-02 DIAGNOSIS — R1310 Dysphagia, unspecified: Secondary | ICD-10-CM | POA: Insufficient documentation

## 2015-03-02 DIAGNOSIS — M797 Fibromyalgia: Secondary | ICD-10-CM | POA: Diagnosis present

## 2015-03-02 DIAGNOSIS — M1990 Unspecified osteoarthritis, unspecified site: Secondary | ICD-10-CM | POA: Diagnosis present

## 2015-03-02 DIAGNOSIS — D61810 Antineoplastic chemotherapy induced pancytopenia: Secondary | ICD-10-CM | POA: Diagnosis present

## 2015-03-02 DIAGNOSIS — Z825 Family history of asthma and other chronic lower respiratory diseases: Secondary | ICD-10-CM

## 2015-03-02 DIAGNOSIS — Z833 Family history of diabetes mellitus: Secondary | ICD-10-CM

## 2015-03-02 DIAGNOSIS — Z86711 Personal history of pulmonary embolism: Secondary | ICD-10-CM

## 2015-03-02 DIAGNOSIS — Z8711 Personal history of peptic ulcer disease: Secondary | ICD-10-CM

## 2015-03-02 DIAGNOSIS — K2289 Other specified disease of esophagus: Secondary | ICD-10-CM | POA: Insufficient documentation

## 2015-03-02 DIAGNOSIS — K209 Esophagitis, unspecified without bleeding: Secondary | ICD-10-CM | POA: Diagnosis present

## 2015-03-02 DIAGNOSIS — K1230 Oral mucositis (ulcerative), unspecified: Secondary | ICD-10-CM

## 2015-03-02 DIAGNOSIS — K449 Diaphragmatic hernia without obstruction or gangrene: Secondary | ICD-10-CM | POA: Diagnosis present

## 2015-03-02 DIAGNOSIS — I10 Essential (primary) hypertension: Secondary | ICD-10-CM | POA: Diagnosis present

## 2015-03-02 DIAGNOSIS — R0902 Hypoxemia: Secondary | ICD-10-CM | POA: Diagnosis present

## 2015-03-02 DIAGNOSIS — E785 Hyperlipidemia, unspecified: Secondary | ICD-10-CM | POA: Diagnosis present

## 2015-03-02 DIAGNOSIS — E860 Dehydration: Secondary | ICD-10-CM | POA: Diagnosis present

## 2015-03-02 DIAGNOSIS — Z9049 Acquired absence of other specified parts of digestive tract: Secondary | ICD-10-CM

## 2015-03-02 DIAGNOSIS — C7931 Secondary malignant neoplasm of brain: Secondary | ICD-10-CM | POA: Diagnosis present

## 2015-03-02 DIAGNOSIS — Z79899 Other long term (current) drug therapy: Secondary | ICD-10-CM

## 2015-03-02 DIAGNOSIS — K259 Gastric ulcer, unspecified as acute or chronic, without hemorrhage or perforation: Secondary | ICD-10-CM | POA: Diagnosis present

## 2015-03-02 DIAGNOSIS — R509 Fever, unspecified: Secondary | ICD-10-CM | POA: Diagnosis not present

## 2015-03-02 DIAGNOSIS — Y842 Radiological procedure and radiotherapy as the cause of abnormal reaction of the patient, or of later complication, without mention of misadventure at the time of the procedure: Secondary | ICD-10-CM | POA: Diagnosis present

## 2015-03-02 DIAGNOSIS — J449 Chronic obstructive pulmonary disease, unspecified: Secondary | ICD-10-CM | POA: Diagnosis present

## 2015-03-02 LAB — URINALYSIS, ROUTINE W REFLEX MICROSCOPIC
GLUCOSE, UA: NEGATIVE mg/dL
HGB URINE DIPSTICK: NEGATIVE
LEUKOCYTES UA: NEGATIVE
Nitrite: NEGATIVE
PH: 7 (ref 5.0–8.0)
PROTEIN: NEGATIVE mg/dL
Specific Gravity, Urine: 1.015 (ref 1.005–1.030)

## 2015-03-02 LAB — CBC WITH DIFFERENTIAL/PLATELET
BASOS ABS: 0 10*3/uL (ref 0.0–0.1)
BASOS PCT: 0 %
EOS ABS: 0 10*3/uL (ref 0.0–0.7)
Eosinophils Relative: 0 %
HCT: 44.3 % (ref 36.0–46.0)
Hemoglobin: 14.9 g/dL (ref 12.0–15.0)
LYMPHS ABS: 0.2 10*3/uL — AB (ref 0.7–4.0)
LYMPHS PCT: 6 %
MCH: 29.4 pg (ref 26.0–34.0)
MCHC: 33.6 g/dL (ref 30.0–36.0)
MCV: 87.5 fL (ref 78.0–100.0)
MONO ABS: 0.1 10*3/uL (ref 0.1–1.0)
Monocytes Relative: 5 %
NEUTROS ABS: 2.2 10*3/uL (ref 1.7–7.7)
Neutrophils Relative %: 89 %
PLATELETS: 69 10*3/uL — AB (ref 150–400)
RBC: 5.06 MIL/uL (ref 3.87–5.11)
RDW: 15.8 % — AB (ref 11.5–15.5)
WBC: 2.5 10*3/uL — ABNORMAL LOW (ref 4.0–10.5)

## 2015-03-02 LAB — BASIC METABOLIC PANEL
ANION GAP: 8 (ref 5–15)
BUN: 13 mg/dL (ref 6–20)
CALCIUM: 8.3 mg/dL — AB (ref 8.9–10.3)
CO2: 28 mmol/L (ref 22–32)
CREATININE: 0.53 mg/dL (ref 0.44–1.00)
Chloride: 96 mmol/L — ABNORMAL LOW (ref 101–111)
GFR calc Af Amer: >60 mL/min (ref >60)
GLUCOSE: 132 mg/dL — AB (ref 65–99)
Potassium: 4.6 mmol/L (ref 3.5–5.1)
Sodium: 132 mmol/L — ABNORMAL LOW (ref 135–145)

## 2015-03-02 LAB — I-STAT CG4 LACTIC ACID, ED: LACTIC ACID, VENOUS: 1.79 mmol/L (ref 0.5–2.0)

## 2015-03-02 MED ORDER — VANCOMYCIN HCL IN DEXTROSE 1-5 GM/200ML-% IV SOLN
1000.0000 mg | Freq: Once | INTRAVENOUS | Status: AC
  Administered 2015-03-03: 1000 mg via INTRAVENOUS
  Filled 2015-03-02: qty 200

## 2015-03-02 MED ORDER — DEXTROSE 5 % IV SOLN
2.0000 g | Freq: Once | INTRAVENOUS | Status: AC
  Administered 2015-03-03: 2 g via INTRAVENOUS

## 2015-03-02 MED ORDER — SODIUM CHLORIDE 0.9 % IV BOLUS (SEPSIS)
1000.0000 mL | Freq: Once | INTRAVENOUS | Status: AC
  Administered 2015-03-03: 1000 mL via INTRAVENOUS

## 2015-03-02 MED ORDER — ONDANSETRON HCL 4 MG/2ML IJ SOLN
4.0000 mg | Freq: Once | INTRAMUSCULAR | Status: DC
Start: 2015-03-03 — End: 2015-03-03
  Filled 2015-03-02: qty 2

## 2015-03-02 MED ORDER — LEVOFLOXACIN 500 MG PO TABS: 500.0000 mg | ORAL_TABLET | Freq: Every day | ORAL | Status: DC

## 2015-03-02 MED ORDER — MAGIC MOUTHWASH
15.0000 mL | Freq: Three times a day (TID) | ORAL | Status: DC | PRN
  Administered 2015-03-03: 15 mL via ORAL
  Filled 2015-03-02: qty 15

## 2015-03-02 MED ORDER — SODIUM CHLORIDE 0.9 % IV BOLUS (SEPSIS)
500.0000 mL | Freq: Once | INTRAVENOUS | Status: AC
  Administered 2015-03-02: 500 mL via INTRAVENOUS

## 2015-03-02 MED ORDER — MAGIC MOUTHWASH: 10.0000 mL | Freq: Four times a day (QID) | ORAL | Status: DC

## 2015-03-02 MED ORDER — SODIUM CHLORIDE 0.9 % IV SOLN
1000.0000 mL | INTRAVENOUS | Status: DC
  Administered 2015-03-03: 1000 mL via INTRAVENOUS

## 2015-03-02 MED ORDER — MAGIC MOUTHWASH
10.0000 mL | Freq: Once | ORAL | Status: AC
  Administered 2015-03-02: 10 mL via ORAL
  Filled 2015-03-02: qty 10

## 2015-03-02 MED ORDER — SODIUM CHLORIDE 0.9 % IV SOLN
INTRAVENOUS | Status: DC
  Administered 2015-03-02: 09:00:00 via INTRAVENOUS

## 2015-03-02 MED ORDER — MORPHINE SULFATE (PF) 4 MG/ML IV SOLN
4.0000 mg | Freq: Once | INTRAVENOUS | Status: AC
  Administered 2015-03-03: 4 mg via INTRAVENOUS
  Filled 2015-03-02: qty 1

## 2015-03-02 MED ORDER — LEVOFLOXACIN IN D5W 500 MG/100ML IV SOLN
500.0000 mg | Freq: Once | INTRAVENOUS | Status: AC
  Administered 2015-03-02: 500 mg via INTRAVENOUS
  Filled 2015-03-02: qty 100

## 2015-03-02 NOTE — ED Notes (Signed)
Pt was seen here earlier today for pneumonia and an oral ulcer from radiation/chemo. Pt went home and pt began running a fever.

## 2015-03-02 NOTE — ED Notes (Addendum)
sats 95% on room air.

## 2015-03-02 NOTE — ED Notes (Addendum)
C/o mouth and throat sores.  Just finished radiation for thyroid cancer.  States she has been unable to eat or drink for several days.  Per husband pt has had a productive  cough since wednesday.  Room air sats 86%.

## 2015-03-02 NOTE — ED Provider Notes (Signed)
CSN: [id number]     Arrival date & time 03/02/15  0737 History  By signing my name below, I, Haley Lowe, attest that this documentation has been prepared under the direction and in the presence of Haley Bo, MD. Electronically Signed: Stephania Lowe, ED Scribe. 03/02/2015. 3:41 PM.   Chief Complaint  Patient presents with  . Sore Throat   The history is provided by the patient. No language interpreter was used.   HPI Comments: Haley Lowe is a 59 y.o. female with a history of thyroid cancer, who presents to the Emergency Department complaining of a sore throat due to throat and mouth sores that she first noticed 4 days ago. Per husband, patient has also had an associated productive cough - patient states she had seen a PA at Dr. Nolon Rod office 4 days ago and was prescribed clindamycin because she had a cough. Patient states she recently finished 15 days of radiation therapy for thyroid cancer, in addition to undergoing chemotherapy every day. [name] reports she has a history of HTN that is exacerbated by the radiation therapy, controlled by medications. Patient denies a history of DM. She denies fever.    PCP: Dr. Gerarda Fraction  Past Medical History  Diagnosis Date  . Essential hypertension   . Anxiety   . Depression   . COPD (chronic obstructive pulmonary disease) ([place])   . GERD (gastroesophageal reflux disease)   . Headache(784.0)   . Arthritis   . Hx of adenomatous colonic polyps   . Emphysema   . Cataract     1 lens implaqnt right eye,intact on left eye  . Goiter   . Fibromyalgia   . Pulmonary emboli ([place]) 11/04/11    Right upper lobe and r lower lobe PE  . Lung nodule seen on imaging study 11/04/11 CT    70m LLL  . D[place] (deep venous thrombosis) ([place] 12/09/2011  . Pulmonary embolism ([place] 12/09/2011  . Hyperlipidemia   . History of radiation therapy 10/06/11    SRS 15Gy 166f brain  . UTI (lower urinary tract infection)   . Borderline diabetes mellitus   . Metastatic  adenocarcinoma to brain ([facility]/12/13    Left frontoparietal region  . Thyroid cancer ([facility]  . S/P radiation therapy 05/31/14 SRS    Brain   Past Surgical History  Procedure Laterality Date  . Abdominal hysterectomy  1996  . Cholecystectomy  2000  . Shoulder surgery Right 1998  . Eye surgery  2001  . Foot surgery Left 2005  . Craniotomy  09/15/2011    Procedure: CRANIOTOMY TUMOR EXCISION;  Surgeon: RoHosie SpangleMD;  [facility];  Service: Neurosurgery;  Laterality: N/A;  Craniotomy resection of tumor with stealth  . Cataract extraction  2011    With lens implant  . [facility]29-2013    [facility]    . Video bronchoscopy with endobronchial ultrasound N/A 04/11/2014    Procedure: VIDEO BRONCHOSCOPY WITH ENDOBRONCHIAL ULTRASOUND;  Surgeon: StMelrose NakayamaMD;  [facility];  Service: Thoracic;  Laterality: N/A;   Family History  Problem Relation Age of Onset  . Asthma Mother   . Kidney failure Father   . Diabetes Sister   . Heart attack Sister   . Colon cancer Brother   . COPD Sister   . Aneurysm Paternal Grandmother     Brain  . Parkinsonism Maternal Uncle   . COPD Brother  Social History  Substance Use Topics  . Smoking status: Former Smoker -- 1.00 packs/day for 40 years    Types: Cigarettes    Quit date: 04/06/2011  . Smokeless tobacco: Never Used  . Alcohol Use: No   OB History    No data available     Review of Systems  All other systems reviewed and are negative.  Allergies  Penicillins; Other; Codeine; and Tape  Home Medications   Prior to Admission medications   Medication Sig Start Date End Date Taking? Authorizing Provider  ALPRAZolam Duanne Moron) 0.5 MG tablet Take 0.5 mg by mouth 3 (three) times daily as needed for anxiety.  01/04/12  Yes Historical Provider, MD  amitriptyline (ELAVIL) 25 MG tablet Take 25 mg by mouth at bedtime.   Yes Historical Provider, MD   Lenvatinib 14 MG Daily Dose (LENVIMA 14 MG DAILY DOSE) 10 & 4 MG CPPK Take 14 mg by mouth. 02/20/15  Yes Historical Provider, MD  levothyroxine (SYNTHROID, LEVOTHROID) 137 MCG tablet Take 137 mcg by mouth daily before breakfast.   Yes Historical Provider, MD  magic mouthwash w/lidocaine SOLN Take 5 mLs by mouth 4 (four) times daily as needed for mouth pain. 02/25/15  Yes Kyung Rudd, MD  Magnesium 250 MG TABS Take 1 tablet by mouth daily.   Yes Historical Provider, MD  morphine (MS CONTIN) 60 MG 12 hr tablet Take 60 mg by mouth every 12 (twelve) hours.   Yes Historical Provider, MD  omeprazole (PRILOSEC) 40 MG capsule Take 1 capsule (40 mg total) by mouth daily. 12/06/14  Yes Manus Gunning, MD  ondansetron (ZOFRAN ODT) 8 MG disintegrating tablet Take 1 tablet (8 mg total) by mouth every 8 (eight) hours as needed for nausea or vomiting. 04/05/14  Yes Pattricia Boss, MD  ondansetron (ZOFRAN) 8 MG tablet Take 8 mg by mouth every 8 (eight) hours as needed for nausea or vomiting.   Yes Historical Provider, MD  Oxycodone HCl 20 MG TABS Take 1 tablet by mouth every 4 (four) hours as needed (pain).   Yes Historical Provider, MD  polyethylene glycol (MIRALAX / GLYCOLAX) packet Take 17 g by mouth every other day. For constipation   Yes Historical Provider, MD  Simethicone (GAS-X EXTRA STRENGTH PO) Take 1 tablet by mouth daily as needed (gas relief).   Yes Historical Provider, MD  dexamethasone (DECADRON) 4 MG tablet Take 1 tablet (4 mg total) by mouth 2 (two) times daily. Patient taking differently: Take 4 mg by mouth daily.  02/25/15   Kyung Rudd, MD  levofloxacin (LEVAQUIN) 500 MG tablet Take 1 tablet (500 mg total) by mouth daily. 03/02/15   Haley Bo, MD  levothyroxine (SYNTHROID, LEVOTHROID) 100 MCG tablet Take 100 mcg by mouth daily before breakfast.    Historical Provider, MD  levothyroxine (SYNTHROID, LEVOTHROID) 75 MCG tablet Take 37.5 tablets by mouth daily.  11/14/14   Historical Provider, MD   magic mouthwash SOLN Take 10 mLs by mouth 4 (four) times daily. Swish medication in mouth and spit it out 03/02/15   Haley Bo, MD  Wound Dressings [facility]) Apply 1 application topically daily.    Historical Provider, MD   BP 148/98 mmHg  Pulse 80  Temp(Src) 99.1 F (37.3 C) (Rectal)  Resp 18  Ht '5\' 3"'$  (1.6 m)  Wt 154 lb (69.854 kg)  BMI 27.29 kg/m2  SpO2 93% Physical Exam  Constitutional: She is oriented to person, place, and time. She appears well-developed and well-nourished.  HENT:  Head:  Normocephalic and atraumatic.  Superficial ulcerations over hard and soft palates, and on anterior surface of tongue.   Eyes: Conjunctivae and EOM are normal. Pupils are equal, round, and reactive to light.  Neck: Normal range of motion and phonation normal. Neck supple.  Cardiovascular: Normal rate, regular rhythm and normal heart sounds.   Pulmonary/Chest: Effort normal. She has no wheezes. She has rhonchi. She has no rales. She exhibits no tenderness.  Good air movement. Upper airway rhonchi. No wheezes or rales.   Abdominal: Soft. She exhibits no distension. There is no tenderness. There is no guarding.  Musculoskeletal: Normal range of motion.  Neurological: She is alert and oriented to person, place, and time. She exhibits normal muscle tone.  Skin: Skin is warm and dry.  Psychiatric: She has a normal mood and affect. Her behavior is normal. Judgment and thought content normal.  Nursing note and vitals reviewed.   ED Course  Procedures (including critical care time)  DIAGNOSTIC STUDIES: Oxygen Saturation is 86% on RA, low by my interpretation.    COORDINATION OF CARE: 8:20 AM - Discussed treatment plan with pt at bedside which includes blood tests and CXR. Pt verbalized understanding and agreed to plan.   11:59 AM - Pt and her husband updated on lab results and XR findings, which showed mild pneumonia. Will treat with Levaquin. Pt advised that her ulcers may resolve on their  own since she stopped radiation therapy. Pt and  verbalized understanding and agreed to plan.    Labs Review Labs Reviewed  CBC WITH DIFFERENTIAL/PLATELET - Abnormal; Notable for the following:    WBC 2.5 (*)    RDW 15.8 (*)    Platelets 69 (*)    Lymphs Abs 0.2 (*)    All other components within normal limits  BASIC METABOLIC PANEL - Abnormal; Notable for the following:    Sodium 132 (*)    Chloride 96 (*)    Glucose, Bld 132 (*)    Calcium 8.3 (*)    All other components within normal limits  I-STAT CG4 LACTIC ACID, ED    Imaging Review Dg Chest 2 View  03/02/2015  CLINICAL DATA:  Cough, congestion and sore mild as patient just completed radiation treatment for thyroid cancer. EXAM: CHEST  2 VIEW COMPARISON:  01/01/2015 FINDINGS: Lungs are adequately inflated and demonstrate worsening bibasilar heterogeneous density with bronchial thickening. No evidence of effusion. Stable borderline cardiomegaly. There is calcified plaque over the aortic arch. There are degenerative changes of the spine. IMPRESSION: Worsening heterogeneous bibasilar opacification with bronchial thickening as findings may be due to bronchitis, although cannot exclude pneumonia. Electronically Signed   By: Marin Olp M.D.   On: 03/02/2015 08:48   I have personally reviewed and evaluated these images and lab results as part of my medical decision-making.   MDM   Diagnoses that have been ruled out:  None  Diagnoses that are still under consideration:  None  Final diagnoses:  CAP (community acquired pneumonia)  Oral ulcer   Evaluation consistent with mucoid pneumonia, with stable hemodynamic findings. Oral ulcerations, likely secondary to recent radiation treatment. Doubt sepsis, metabolic instability or impending vascular collapse. Patient improved with treatment in the ED.  Nursing Notes Reviewed/ Care Coordinated Applicable Imaging Reviewed Interpretation of Laboratory Data incorporated into ED  treatment  The patient appears reasonably screened and/or stabilized for discharge and I doubt any other medical condition or other [facility] requiring further screening, evaluation, or treatment in the ED at this time prior  to discharge.  Plan: Home Medications- Levaquin, Magic mouthwast; Home Treatments- gradually advance diet; return here if the recommended treatment, does not improve the symptoms; Recommended follow up- PCP 3 days   I personally performed the services described in this documentation, which was scribed in my presence. The recorded information has been reviewed and is accurate.      Haley Bo, MD 03/02/15 [phone]

## 2015-03-02 NOTE — ED Notes (Signed)
Room air sats 94% and up

## 2015-03-02 NOTE — ED Provider Notes (Signed)
CSN: [id number]     Arrival date & time 03/02/15  2210 History   First MD Initiated Contact with Patient 03/02/15 2257     Chief Complaint  Patient presents with  . Fever     (Consider location/radiation/quality/duration/timing/severity/associated sxs/prior Treatment) HPI  This 59 year old female with history of metastatic thyroid cancer, COPD, PE who presents with fever. Patient was seen and evaluated earlier today and diagnosed with pneumonia. She was placed on Levaquin. At that time she was afebrile and her workup is reassuring. Since going home, patient has developed a fever to 103. Per the patient's family, patient has had a productive cough since Wednesday. She was placed on clindamycin as an outpatient to "prevent any pneumonia." She was taking that daily. They also report that she has had increasing mouth sores related to outpatient radiation. She has had decreased by mouth intake since that time. She is on oral chemotherapy. Patient rates her pain at 10 out of 10 and pain is related to her mouth sores. She otherwise is largely noncontributory to history taking as she has difficulty talking because of pain in her mouth.  Past Medical History  Diagnosis Date  . Essential hypertension   . Anxiety   . Depression   . COPD (chronic obstructive pulmonary disease) ([place])   . GERD (gastroesophageal reflux disease)   . Headache(784.0)   . Arthritis   . Hx of adenomatous colonic polyps   . Emphysema   . Cataract     1 lens implaqnt right eye,intact on left eye  . Goiter   . Fibromyalgia   . Pulmonary emboli ([place]) 11/04/11    Right upper lobe and r lower lobe PE  . Lung nodule seen on imaging study 11/04/11 CT    60m LLL  . DVT (deep venous thrombosis) ([facility] 12/09/2011  . Pulmonary embolism ([facility] 12/09/2011  . Hyperlipidemia   . History of radiation therapy 10/06/11    SRS 15Gy 18f brain  . UTI (lower urinary tract infection)   . Borderline diabetes mellitus   . Metastatic adenocarcinoma  to brain ([facility]13    Left frontoparietal region  . Thyroid cancer ([facility]  . S/P radiation therapy 05/31/14 SRS    Brain   Past Surgical History  Procedure Laterality Date  . Abdominal hysterectomy  1996  . Cholecystectomy  2000  . Shoulder surgery Right 1998  . Eye surgery  2001  . Foot surgery Left 2005  . Craniotomy  09/15/2011    Procedure: CRANIOTOMY TUMOR EXCISION;  Surgeon: RoHosie SpangleMD;  [facility];  Service: Neurosurgery;  Laterality: N/A;  Craniotomy resection of tumor with stealth  . Cataract extraction  2011    With lens implant  . Total thyroidectomy  10-29-2013    [facility]    . Video bronchoscopy with endobronchial ultrasound N/A 04/11/2014    Procedure: VIDEO BRONCHOSCOPY WITH ENDOBRONCHIAL ULTRASOUND;  Surgeon: StMelrose NakayamaMD;  [facility];  Service: Thoracic;  Laterality: N/A;   Family History  Problem Relation Age of Onset  . Asthma Mother   . Kidney failure Father   . Diabetes Sister   . Heart attack Sister   . Colon cancer Brother   . COPD Sister   . Aneurysm Paternal Grandmother     Brain  . Parkinsonism Maternal Uncle   . COPD Brother    Social History  Substance Use Topics  . Smoking status: Former  Smoker -- 1.00 packs/day for 40 years    Types: Cigarettes    Quit date: 04/06/2011  . Smokeless tobacco: Never Used  . Alcohol Use: No   OB History    No data available     Review of Systems  Constitutional: Positive for fever.  HENT: Positive for mouth sores.   Respiratory: Positive for cough and shortness of breath. Negative for chest tightness.   Cardiovascular: Negative for chest pain.  Gastrointestinal: Negative for nausea, vomiting and abdominal pain.  Genitourinary: Negative for dysuria.  All other systems reviewed and are negative.     Allergies  Penicillins; Other; Codeine; and Tape  Home Medications   Prior to Admission medications    Medication Sig Start Date End Date Taking? Authorizing Provider  ALPRAZolam Duanne Moron) 0.5 MG tablet Take 0.5 mg by mouth 3 (three) times daily as needed for anxiety.  01/04/12  Yes Historical Provider, MD  amitriptyline (ELAVIL) 25 MG tablet Take 25 mg by mouth at bedtime.   Yes Historical Provider, MD  dexamethasone (DECADRON) 4 MG tablet Take 1 tablet (4 mg total) by mouth 2 (two) times daily. Patient taking differently: Take 4 mg by mouth daily.  02/25/15  Yes Kyung Rudd, MD  Lenvatinib 14 MG Daily Dose (LENVIMA 14 MG DAILY DOSE) 10 & 4 MG CPPK Take 14 mg by mouth daily. '10mg'$  cap and '4mg'$  cap per dose 02/20/15  Yes Historical Provider, MD  levothyroxine (SYNTHROID, LEVOTHROID) 137 MCG tablet Take 137 mcg by mouth daily before breakfast.   Yes Historical Provider, MD  magic mouthwash w/lidocaine SOLN Take 5 mLs by mouth 4 (four) times daily as needed for mouth pain. 02/25/15  Yes Kyung Rudd, MD  Magnesium 250 MG TABS Take 1 tablet by mouth daily.   Yes Historical Provider, MD  morphine (MS CONTIN) 60 MG 12 hr tablet Take 60 mg by mouth every 12 (twelve) hours.   Yes Historical Provider, MD  omeprazole (PRILOSEC) 40 MG capsule Take 1 capsule (40 mg total) by mouth daily. 12/06/14  Yes Manus Gunning, MD  ondansetron (ZOFRAN ODT) 8 MG disintegrating tablet Take 1 tablet (8 mg total) by mouth every 8 (eight) hours as needed for nausea or vomiting. 04/05/14  Yes Pattricia Boss, MD  ondansetron (ZOFRAN) 8 MG tablet Take 8 mg by mouth every 8 (eight) hours as needed for nausea or vomiting.   Yes Historical Provider, MD  Oxycodone HCl 20 MG TABS Take 1 tablet by mouth every 4 (four) hours as needed (pain).   Yes Historical Provider, MD  polyethylene glycol (MIRALAX / GLYCOLAX) packet Take 17 g by mouth every other day. For constipation   Yes Historical Provider, MD  Simethicone (GAS-X EXTRA STRENGTH PO) Take 1 tablet by mouth daily as needed (gas relief).   Yes Historical Provider, MD  Wound Dressings  (SONAFINE) Apply 1 application topically daily.   Yes Historical Provider, MD  levofloxacin (LEVAQUIN) 500 MG tablet Take 1 tablet (500 mg total) by mouth daily. 03/02/15   Daleen Bo, MD  magic mouthwash SOLN Take 10 mLs by mouth 4 (four) times daily. Swish medication in mouth and spit it out 03/02/15   Daleen Bo, MD   BP 137/87 mmHg  Pulse 96  Temp(Src) 102.5 F (39.2 C) (Rectal)  Resp 18  SpO2 93% Physical Exam  Constitutional: She is oriented to person, place, and time. No distress.  Chronically ill-appearing, no acute distress  HENT:  Head: Normocephalic and atraumatic.  Alopecia Multiple ulcerative sores  over the tongue and hard palate, mucositis present, otherwise airway is patent  Eyes: Pupils are equal, round, and reactive to light.  Neck: Neck supple.  Cardiovascular: Normal rate, regular rhythm and normal heart sounds.   No murmur heard. Pulmonary/Chest: Effort normal. No respiratory distress. She has no wheezes. She has rales.  Abdominal: Soft. Bowel sounds are normal. There is no tenderness. There is no rebound.  Musculoskeletal: She exhibits no edema.  Neurological: She is alert and oriented to person, place, and time.  Skin: Skin is warm and dry.  Nursing note and vitals reviewed.   ED Course  Procedures (including critical care time) Labs Review Labs Reviewed  COMPREHENSIVE METABOLIC PANEL - Abnormal; Notable for the following:    Sodium 132 (*)    Potassium 3.1 (*)    Chloride 98 (*)    CO2 21 (*)    Creatinine, Ser 0.42 (*)    Calcium 8.1 (*)    Total Protein 6.1 (*)    Albumin 2.9 (*)    Total Bilirubin 1.3 (*)    All other components within normal limits  TROPONIN I - Abnormal; Notable for the following:    Troponin I 0.05 (*)    All other components within normal limits  CBC WITH DIFFERENTIAL/PLATELET - Abnormal; Notable for the following:    WBC 1.7 (*)    RBC 5.14 (*)    RDW 16.0 (*)    Platelets 59 (*)    Neutro Abs 1.4 (*)    Lymphs  Abs 0.2 (*)    All other components within normal limits  URINALYSIS, ROUTINE W REFLEX MICROSCOPIC (NOT AT [facility]) - Abnormal; Notable for the following:    Bilirubin Urine SMALL (*)    Ketones, ur >80 (*)    All other components within normal limits  URINE CULTURE  CULTURE, BLOOD (ROUTINE X 2)  CULTURE, BLOOD (ROUTINE X 2)  LACTIC ACID, PLASMA  LACTIC ACID, PLASMA  INFLUENZA PANEL BY PCR (TYPE A & B, H1N1)    Imaging Review Dg Chest 2 View  03/03/2015  CLINICAL DATA:  Acute onset of fever, cough, and mouth and throat sores. Status post recent radiation therapy for thyroid cancer. Recently diagnosed with pneumonia. Initial encounter. EXAM: CHEST  2 VIEW COMPARISON:  Chest radiograph performed 03/02/2015 FINDINGS: Persistent bibasilar airspace opacities, worse on the left, raise concern for pneumonia, similar in appearance to the prior study. No pleural effusion or pneumothorax is seen. The heart remains normal in size. No acute osseous abnormalities are identified. IMPRESSION: Persistent bibasilar airspace opacities, worse on the left, raise concern for mild pneumonia, similar appearance to the prior study. Electronically Signed   By: Garald Balding M.D.   On: 03/03/2015 00:56   Dg Chest 2 View  03/02/2015  CLINICAL DATA:  Cough, congestion and sore mild as patient just completed radiation treatment for thyroid cancer. EXAM: CHEST  2 VIEW COMPARISON:  01/01/2015 FINDINGS: Lungs are adequately inflated and demonstrate worsening bibasilar heterogeneous density with bronchial thickening. No evidence of effusion. Stable borderline cardiomegaly. There is calcified plaque over the aortic arch. There are degenerative changes of the spine. IMPRESSION: Worsening heterogeneous bibasilar opacification with bronchial thickening as findings may be due to bronchitis, although cannot exclude pneumonia. Electronically Signed   By: Marin Olp M.D.   On: 03/02/2015 08:48   I have personally reviewed and  evaluated these images and lab results as part of my medical decision-making.   EKG Interpretation   Date/Time:  Monday March 03 2015 01:00:09 EST Ventricular Rate:  85 PR Interval:  148 QRS Duration: 81 QT Interval:  357 QTC Calculation: 424 R Axis:   -43 Text Interpretation:  Sinus rhythm Left anterior fascicular block Abnormal  R-wave progression, late transition Confirmed by Orange Hilligoss  MD, Loma Sousa  ([id number]) on 03/03/2015 1:39:13 AM      MDM   Final diagnoses:  Community acquired pneumonia  Mucositis oral  Dehydration  Hypokalemia    Patient presents with fever and decreased oral intake. Was seen earlier today and diagnosed with pneumonia. Was started on Levaquin. Has previously been on clindamycin. Temperature here 102.5. She is chronically ill-appearing. Requiring oxygen for O2 sats in the mid 80s.  Coarse Rales bilaterally. Patient was given fluids. Sepsis workup initiated. Lactate normal. Patient is leukopenic but not neutropenic. Mildly hypokalemic. Urine shows greater than 80 ketones. Given the patient has previously been on clindamycin and was given a dose of Levaquin earlier today, will broaden patient out to vancomycin and aztreonam given her clinical picture and leukopenia. Chest x-ray is mostly unchanged from earlier today.  Given evidence of dehydration and oxygen requirement, will admit. Blood cultures pending.      Merryl Hacker, MD 03/03/15 [phone]

## 2015-03-03 ENCOUNTER — Inpatient Hospital Stay (HOSPITAL_COMMUNITY): Payer: Medicare Other

## 2015-03-03 DIAGNOSIS — E871 Hypo-osmolality and hyponatremia: Secondary | ICD-10-CM

## 2015-03-03 DIAGNOSIS — Z6825 Body mass index (BMI) 25.0-25.9, adult: Secondary | ICD-10-CM | POA: Diagnosis not present

## 2015-03-03 DIAGNOSIS — D72819 Decreased white blood cell count, unspecified: Secondary | ICD-10-CM | POA: Diagnosis not present

## 2015-03-03 DIAGNOSIS — C7931 Secondary malignant neoplasm of brain: Secondary | ICD-10-CM | POA: Diagnosis present

## 2015-03-03 DIAGNOSIS — K449 Diaphragmatic hernia without obstruction or gangrene: Secondary | ICD-10-CM | POA: Diagnosis present

## 2015-03-03 DIAGNOSIS — J690 Pneumonitis due to inhalation of food and vomit: Secondary | ICD-10-CM | POA: Diagnosis present

## 2015-03-03 DIAGNOSIS — D61810 Antineoplastic chemotherapy induced pancytopenia: Secondary | ICD-10-CM | POA: Diagnosis present

## 2015-03-03 DIAGNOSIS — K22719 Barrett's esophagus with dysplasia, unspecified: Secondary | ICD-10-CM | POA: Diagnosis present

## 2015-03-03 DIAGNOSIS — Z9049 Acquired absence of other specified parts of digestive tract: Secondary | ICD-10-CM | POA: Diagnosis not present

## 2015-03-03 DIAGNOSIS — Z923 Personal history of irradiation: Secondary | ICD-10-CM | POA: Diagnosis not present

## 2015-03-03 DIAGNOSIS — R1310 Dysphagia, unspecified: Secondary | ICD-10-CM | POA: Diagnosis not present

## 2015-03-03 DIAGNOSIS — B3781 Candidal esophagitis: Secondary | ICD-10-CM | POA: Diagnosis present

## 2015-03-03 DIAGNOSIS — D696 Thrombocytopenia, unspecified: Secondary | ICD-10-CM | POA: Diagnosis present

## 2015-03-03 DIAGNOSIS — R509 Fever, unspecified: Secondary | ICD-10-CM | POA: Diagnosis present

## 2015-03-03 DIAGNOSIS — Z90710 Acquired absence of both cervix and uterus: Secondary | ICD-10-CM | POA: Diagnosis not present

## 2015-03-03 DIAGNOSIS — E860 Dehydration: Secondary | ICD-10-CM | POA: Insufficient documentation

## 2015-03-03 DIAGNOSIS — B0089 Other herpesviral infection: Secondary | ICD-10-CM | POA: Diagnosis not present

## 2015-03-03 DIAGNOSIS — Z79899 Other long term (current) drug therapy: Secondary | ICD-10-CM | POA: Diagnosis not present

## 2015-03-03 DIAGNOSIS — R0902 Hypoxemia: Secondary | ICD-10-CM | POA: Diagnosis present

## 2015-03-03 DIAGNOSIS — J189 Pneumonia, unspecified organism: Secondary | ICD-10-CM | POA: Diagnosis present

## 2015-03-03 DIAGNOSIS — Y842 Radiological procedure and radiotherapy as the cause of abnormal reaction of the patient, or of later complication, without mention of misadventure at the time of the procedure: Secondary | ICD-10-CM | POA: Diagnosis present

## 2015-03-03 DIAGNOSIS — K259 Gastric ulcer, unspecified as acute or chronic, without hemorrhage or perforation: Secondary | ICD-10-CM | POA: Diagnosis present

## 2015-03-03 DIAGNOSIS — K1233 Oral mucositis (ulcerative) due to radiation: Secondary | ICD-10-CM | POA: Diagnosis not present

## 2015-03-03 DIAGNOSIS — M1990 Unspecified osteoarthritis, unspecified site: Secondary | ICD-10-CM | POA: Diagnosis present

## 2015-03-03 DIAGNOSIS — Z820 Family history of epilepsy and other diseases of the nervous system: Secondary | ICD-10-CM | POA: Diagnosis not present

## 2015-03-03 DIAGNOSIS — J449 Chronic obstructive pulmonary disease, unspecified: Secondary | ICD-10-CM

## 2015-03-03 DIAGNOSIS — K222 Esophageal obstruction: Secondary | ICD-10-CM | POA: Diagnosis present

## 2015-03-03 DIAGNOSIS — K210 Gastro-esophageal reflux disease with esophagitis: Secondary | ICD-10-CM | POA: Diagnosis present

## 2015-03-03 DIAGNOSIS — Z8249 Family history of ischemic heart disease and other diseases of the circulatory system: Secondary | ICD-10-CM | POA: Diagnosis not present

## 2015-03-03 DIAGNOSIS — A419 Sepsis, unspecified organism: Secondary | ICD-10-CM | POA: Diagnosis present

## 2015-03-03 DIAGNOSIS — Z825 Family history of asthma and other chronic lower respiratory diseases: Secondary | ICD-10-CM | POA: Diagnosis not present

## 2015-03-03 DIAGNOSIS — Z8711 Personal history of peptic ulcer disease: Secondary | ICD-10-CM | POA: Diagnosis not present

## 2015-03-03 DIAGNOSIS — Z86711 Personal history of pulmonary embolism: Secondary | ICD-10-CM | POA: Diagnosis not present

## 2015-03-03 DIAGNOSIS — M797 Fibromyalgia: Secondary | ICD-10-CM | POA: Diagnosis present

## 2015-03-03 DIAGNOSIS — J440 Chronic obstructive pulmonary disease with acute lower respiratory infection: Secondary | ICD-10-CM | POA: Diagnosis present

## 2015-03-03 DIAGNOSIS — Z800 Family history of malignant neoplasm of digestive organs: Secondary | ICD-10-CM | POA: Diagnosis not present

## 2015-03-03 DIAGNOSIS — K229 Disease of esophagus, unspecified: Secondary | ICD-10-CM | POA: Diagnosis not present

## 2015-03-03 DIAGNOSIS — E785 Hyperlipidemia, unspecified: Secondary | ICD-10-CM | POA: Diagnosis present

## 2015-03-03 DIAGNOSIS — E039 Hypothyroidism, unspecified: Secondary | ICD-10-CM | POA: Diagnosis not present

## 2015-03-03 DIAGNOSIS — K209 Esophagitis, unspecified: Secondary | ICD-10-CM | POA: Diagnosis not present

## 2015-03-03 DIAGNOSIS — E876 Hypokalemia: Secondary | ICD-10-CM

## 2015-03-03 DIAGNOSIS — I10 Essential (primary) hypertension: Secondary | ICD-10-CM | POA: Diagnosis present

## 2015-03-03 DIAGNOSIS — Z833 Family history of diabetes mellitus: Secondary | ICD-10-CM | POA: Diagnosis not present

## 2015-03-03 DIAGNOSIS — Z79891 Long term (current) use of opiate analgesic: Secondary | ICD-10-CM | POA: Diagnosis not present

## 2015-03-03 DIAGNOSIS — C73 Malignant neoplasm of thyroid gland: Secondary | ICD-10-CM | POA: Diagnosis present

## 2015-03-03 DIAGNOSIS — E43 Unspecified severe protein-calorie malnutrition: Secondary | ICD-10-CM | POA: Diagnosis present

## 2015-03-03 DIAGNOSIS — R1314 Dysphagia, pharyngoesophageal phase: Secondary | ICD-10-CM | POA: Diagnosis not present

## 2015-03-03 DIAGNOSIS — K1230 Oral mucositis (ulcerative), unspecified: Secondary | ICD-10-CM | POA: Diagnosis not present

## 2015-03-03 DIAGNOSIS — Z87891 Personal history of nicotine dependence: Secondary | ICD-10-CM | POA: Diagnosis not present

## 2015-03-03 DIAGNOSIS — T451X5A Adverse effect of antineoplastic and immunosuppressive drugs, initial encounter: Secondary | ICD-10-CM | POA: Diagnosis present

## 2015-03-03 DIAGNOSIS — Y95 Nosocomial condition: Secondary | ICD-10-CM | POA: Diagnosis present

## 2015-03-03 DIAGNOSIS — D61818 Other pancytopenia: Secondary | ICD-10-CM | POA: Diagnosis not present

## 2015-03-03 DIAGNOSIS — K219 Gastro-esophageal reflux disease without esophagitis: Secondary | ICD-10-CM | POA: Diagnosis not present

## 2015-03-03 DIAGNOSIS — F419 Anxiety disorder, unspecified: Secondary | ICD-10-CM | POA: Diagnosis present

## 2015-03-03 HISTORY — DX: Oral mucositis (ulcerative) due to radiation: K12.33

## 2015-03-03 HISTORY — DX: Pneumonia, unspecified organism: J18.9

## 2015-03-03 LAB — COMPREHENSIVE METABOLIC PANEL
ALBUMIN: 2.9 g/dL — AB (ref 3.5–5.0)
ALT: 29 U/L (ref 14–54)
ANION GAP: 13 (ref 5–15)
AST: 23 U/L (ref 15–41)
Alkaline Phosphatase: 51 U/L (ref 38–126)
BUN: 12 mg/dL (ref 6–20)
CHLORIDE: 98 mmol/L — AB (ref 101–111)
CO2: 21 mmol/L — ABNORMAL LOW (ref 22–32)
Calcium: 8.1 mg/dL — ABNORMAL LOW (ref 8.9–10.3)
Creatinine, Ser: 0.42 mg/dL — ABNORMAL LOW (ref 0.44–1.00)
GFR calc Af Amer: >60 mL/min (ref >60)
GLUCOSE: 85 mg/dL (ref 65–99)
POTASSIUM: 3.1 mmol/L — AB (ref 3.5–5.1)
Sodium: 132 mmol/L — ABNORMAL LOW (ref 135–145)
Total Bilirubin: 1.3 mg/dL — ABNORMAL HIGH (ref 0.3–1.2)
Total Protein: 6.1 g/dL — ABNORMAL LOW (ref 6.5–8.1)

## 2015-03-03 LAB — CBC WITH DIFFERENTIAL/PLATELET
BASOS PCT: 1 %
Basophils Absolute: 0 10*3/uL (ref 0.0–0.1)
EOS ABS: 0 10*3/uL (ref 0.0–0.7)
Eosinophils Relative: 0 %
HEMATOCRIT: 44.6 % (ref 36.0–46.0)
HEMOGLOBIN: 14.9 g/dL (ref 12.0–15.0)
Lymphocytes Relative: 9 %
Lymphs Abs: 0.2 10*3/uL — ABNORMAL LOW (ref 0.7–4.0)
MCH: 29 pg (ref 26.0–34.0)
MCHC: 33.4 g/dL (ref 30.0–36.0)
MCV: 86.8 fL (ref 78.0–100.0)
MONOS PCT: 7 %
Monocytes Absolute: 0.1 10*3/uL (ref 0.1–1.0)
NEUTROS ABS: 1.4 10*3/uL — AB (ref 1.7–7.7)
NEUTROS PCT: 84 %
Platelets: 59 10*3/uL — ABNORMAL LOW (ref 150–400)
RBC: 5.14 MIL/uL — AB (ref 3.87–5.11)
RDW: 16 % — ABNORMAL HIGH (ref 11.5–15.5)
WBC: 1.7 10*3/uL — AB (ref 4.0–10.5)

## 2015-03-03 LAB — INFLUENZA PANEL BY PCR (TYPE A & B)
H1N1FLUPCR: NOT DETECTED
Influenza A By PCR: NEGATIVE
Influenza B By PCR: NEGATIVE

## 2015-03-03 LAB — BASIC METABOLIC PANEL
ANION GAP: 11 (ref 5–15)
BUN: 11 mg/dL (ref 6–20)
CALCIUM: 7.6 mg/dL — AB (ref 8.9–10.3)
CO2: 21 mmol/L — ABNORMAL LOW (ref 22–32)
Chloride: 103 mmol/L (ref 101–111)
Creatinine, Ser: 0.44 mg/dL (ref 0.44–1.00)
GFR calc Af Amer: >60 mL/min (ref >60)
GLUCOSE: 141 mg/dL — AB (ref 65–99)
Potassium: 3.5 mmol/L (ref 3.5–5.1)
SODIUM: 135 mmol/L (ref 135–145)

## 2015-03-03 LAB — CBC
HCT: 41.8 % (ref 36.0–46.0)
Hemoglobin: 13.8 g/dL (ref 12.0–15.0)
MCH: 29.1 pg (ref 26.0–34.0)
MCHC: 33 g/dL (ref 30.0–36.0)
MCV: 88 fL (ref 78.0–100.0)
Platelets: 60 10*3/uL — ABNORMAL LOW (ref 150–400)
RBC: 4.75 MIL/uL (ref 3.87–5.11)
RDW: 16 % — AB (ref 11.5–15.5)
WBC: 2.6 10*3/uL — ABNORMAL LOW (ref 4.0–10.5)

## 2015-03-03 LAB — PROCALCITONIN: PROCALCITONIN: 0.24 ng/mL

## 2015-03-03 LAB — LACTIC ACID, PLASMA
LACTIC ACID, VENOUS: 1.3 mmol/L (ref 0.5–2.0)
LACTIC ACID, VENOUS: 1.3 mmol/L (ref 0.5–2.0)

## 2015-03-03 LAB — PROTIME-INR
INR: 1.14 (ref 0.00–1.49)
Prothrombin Time: 14.8 seconds (ref 11.6–15.2)

## 2015-03-03 LAB — TROPONIN I: Troponin I: 0.05 ng/mL — ABNORMAL HIGH (ref <0.031)

## 2015-03-03 LAB — APTT: aPTT: 29 seconds (ref 24–37)

## 2015-03-03 LAB — STREP PNEUMONIAE URINARY ANTIGEN: Strep Pneumo Urinary Antigen: NEGATIVE

## 2015-03-03 MED ORDER — POTASSIUM CHLORIDE 10 MEQ/100ML IV SOLN
10.0000 meq | Freq: Once | INTRAVENOUS | Status: AC
Start: 2015-03-03 — End: 2015-03-03
  Administered 2015-03-03: 10 meq via INTRAVENOUS
  Filled 2015-03-03: qty 100

## 2015-03-03 MED ORDER — SONAFINE EX EMUL
1.0000 "application " | Freq: Every day | CUTANEOUS | Status: DC
  Administered 2015-03-05 – 2015-03-06 (×2): 1 via TOPICAL

## 2015-03-03 MED ORDER — DEXTROSE 5 % IV SOLN
2.0000 g | Freq: Three times a day (TID) | INTRAVENOUS | Status: DC
  Administered 2015-03-03 – 2015-03-05 (×6): 2 g via INTRAVENOUS
  Filled 2015-03-03 (×7): qty 2

## 2015-03-03 MED ORDER — LIDOCAINE VISCOUS 2 % MT SOLN
15.0000 mL | Freq: Once | OROMUCOSAL | Status: AC
Start: 2015-03-03 — End: 2015-03-03
  Administered 2015-03-03: 15 mL via OROMUCOSAL
  Filled 2015-03-03: qty 15

## 2015-03-03 MED ORDER — SODIUM CHLORIDE 0.9 % IJ SOLN
3.0000 mL | Freq: Two times a day (BID) | INTRAMUSCULAR | Status: DC
  Administered 2015-03-05 – 2015-03-11 (×4): 3 mL via INTRAVENOUS

## 2015-03-03 MED ORDER — FLUCONAZOLE IN SODIUM CHLORIDE 100-0.9 MG/50ML-% IV SOLN
100.0000 mg | INTRAVENOUS | Status: DC
  Administered 2015-03-03 – 2015-03-12 (×10): 100 mg via INTRAVENOUS
  Filled 2015-03-03 (×11): qty 50

## 2015-03-03 MED ORDER — VANCOMYCIN HCL IN DEXTROSE 750-5 MG/150ML-% IV SOLN
750.0000 mg | Freq: Two times a day (BID) | INTRAVENOUS | Status: DC
  Administered 2015-03-03 – 2015-03-05 (×4): 750 mg via INTRAVENOUS
  Filled 2015-03-03 (×5): qty 150

## 2015-03-03 MED ORDER — POTASSIUM CHLORIDE 10 MEQ/100ML IV SOLN
10.0000 meq | INTRAVENOUS | Status: AC
  Administered 2015-03-03 (×4): 10 meq via INTRAVENOUS
  Filled 2015-03-03: qty 100

## 2015-03-03 MED ORDER — METHYLPREDNISOLONE SODIUM SUCC 125 MG IJ SOLR
80.0000 mg | Freq: Once | INTRAMUSCULAR | Status: AC
  Administered 2015-03-03: 80 mg via INTRAVENOUS
  Filled 2015-03-03: qty 2

## 2015-03-03 MED ORDER — ALUM & MAG HYDROXIDE-SIMETH 200-200-20 MG/5ML PO SUSP: 30.0000 mL | Freq: Four times a day (QID) | ORAL | Status: DC | PRN

## 2015-03-03 MED ORDER — ONDANSETRON HCL 4 MG PO TABS: 4.0000 mg | ORAL_TABLET | Freq: Four times a day (QID) | ORAL | Status: DC | PRN

## 2015-03-03 MED ORDER — POLYETHYLENE GLYCOL 3350 17 G PO PACK
17.0000 g | PACK | ORAL | Status: DC
  Administered 2015-03-03 – 2015-03-13 (×5): 17 g via ORAL
  Filled 2015-03-03 (×7): qty 1

## 2015-03-03 MED ORDER — DEXAMETHASONE SODIUM PHOSPHATE 4 MG/ML IJ SOLN
2.0000 mg | Freq: Two times a day (BID) | INTRAMUSCULAR | Status: DC
  Administered 2015-03-03 – 2015-03-06 (×9): 2 mg via INTRAVENOUS
  Filled 2015-03-03 (×9): qty 1

## 2015-03-03 MED ORDER — ALPRAZOLAM 0.5 MG PO TABS
0.5000 mg | ORAL_TABLET | Freq: Three times a day (TID) | ORAL | Status: DC | PRN
  Administered 2015-03-03 – 2015-03-12 (×2): 0.5 mg via ORAL
  Filled 2015-03-03 (×4): qty 1

## 2015-03-03 MED ORDER — ACETAMINOPHEN 325 MG PO TABS: 650.0000 mg | ORAL_TABLET | Freq: Four times a day (QID) | ORAL | Status: DC | PRN

## 2015-03-03 MED ORDER — SODIUM CHLORIDE 0.9 % IV SOLN
INTRAVENOUS | Status: DC
  Administered 2015-03-03 – 2015-03-09 (×4): via INTRAVENOUS
  Administered 2015-03-10: 1 mL via INTRAVENOUS
  Administered 2015-03-11 – 2015-03-12 (×2): via INTRAVENOUS

## 2015-03-03 MED ORDER — ACETAMINOPHEN 650 MG RE SUPP
650.0000 mg | Freq: Once | RECTAL | Status: AC
  Administered 2015-03-03: 650 mg via RECTAL
  Filled 2015-03-03: qty 1

## 2015-03-03 MED ORDER — MAGNESIUM 200 MG PO TABS
1.0000 | ORAL_TABLET | Freq: Every day | ORAL | Status: DC
  Filled 2015-03-03 (×2): qty 1

## 2015-03-03 MED ORDER — AMITRIPTYLINE HCL 25 MG PO TABS
25.0000 mg | ORAL_TABLET | Freq: Every day | ORAL | Status: DC
  Administered 2015-03-03 – 2015-03-12 (×9): 25 mg via ORAL
  Filled 2015-03-03 (×10): qty 1

## 2015-03-03 MED ORDER — PANTOPRAZOLE SODIUM 40 MG IV SOLR
40.0000 mg | INTRAVENOUS | Status: DC
Start: 2015-03-03 — End: 2015-03-04
  Administered 2015-03-03 – 2015-03-04 (×2): 40 mg via INTRAVENOUS
  Filled 2015-03-03 (×2): qty 40

## 2015-03-03 MED ORDER — MAGIC MOUTHWASH: 15.0000 mL | Freq: Four times a day (QID) | ORAL | Status: DC | PRN

## 2015-03-03 MED ORDER — IPRATROPIUM-ALBUTEROL 0.5-2.5 (3) MG/3ML IN SOLN
3.0000 mL | Freq: Once | RESPIRATORY_TRACT | Status: AC
  Administered 2015-03-03: 3 mL via RESPIRATORY_TRACT
  Filled 2015-03-03: qty 3

## 2015-03-03 MED ORDER — HYDROMORPHONE HCL 1 MG/ML IJ SOLN
0.5000 mg | INTRAMUSCULAR | Status: DC | PRN
  Administered 2015-03-03 – 2015-03-13 (×43): 1 mg via INTRAVENOUS
  Filled 2015-03-03 (×45): qty 1

## 2015-03-03 MED ORDER — MAGNESIUM OXIDE 400 (241.3 MG) MG PO TABS
400.0000 mg | ORAL_TABLET | Freq: Every day | ORAL | Status: DC
  Administered 2015-03-03 – 2015-03-13 (×9): 400 mg via ORAL
  Filled 2015-03-03 (×9): qty 1

## 2015-03-03 MED ORDER — MAGIC MOUTHWASH
10.0000 mL | Freq: Four times a day (QID) | ORAL | Status: DC
  Administered 2015-03-03 – 2015-03-06 (×16): 10 mL via ORAL
  Filled 2015-03-03 (×16): qty 10

## 2015-03-03 MED ORDER — MAGIC MOUTHWASH
ORAL | Status: AC
  Filled 2015-03-03: qty 5

## 2015-03-03 MED ORDER — ONDANSETRON HCL 4 MG/2ML IJ SOLN: 4.0000 mg | Freq: Four times a day (QID) | INTRAMUSCULAR | Status: DC | PRN

## 2015-03-03 MED ORDER — MORPHINE SULFATE ER 30 MG PO TBCR
60.0000 mg | EXTENDED_RELEASE_TABLET | Freq: Two times a day (BID) | ORAL | Status: DC
  Administered 2015-03-03 – 2015-03-04 (×3): 60 mg via ORAL
  Filled 2015-03-03 (×3): qty 2

## 2015-03-03 MED ORDER — DEXTROSE 5 % IV SOLN
INTRAVENOUS | Status: AC
  Filled 2015-03-03: qty 2

## 2015-03-03 MED ORDER — ONDANSETRON HCL 4 MG/2ML IJ SOLN
4.0000 mg | Freq: Once | INTRAMUSCULAR | Status: AC
  Administered 2015-03-03: 4 mg via INTRAVENOUS

## 2015-03-03 MED ORDER — ACETAMINOPHEN 650 MG RE SUPP: 650.0000 mg | Freq: Four times a day (QID) | RECTAL | Status: DC | PRN

## 2015-03-03 MED ORDER — ALBUTEROL SULFATE (2.5 MG/3ML) 0.083% IN NEBU
INHALATION_SOLUTION | RESPIRATORY_TRACT | Status: AC
  Administered 2015-03-03: 2.5 mg
  Filled 2015-03-03: qty 3

## 2015-03-03 MED ORDER — LIDOCAINE VISCOUS 2 % MT SOLN
15.0000 mL | OROMUCOSAL | Status: DC | PRN
  Administered 2015-03-03 – 2015-03-06 (×13): 15 mL via OROMUCOSAL
  Filled 2015-03-03 (×14): qty 15

## 2015-03-03 NOTE — H&P (Signed)
[facility] Admission History and Physical       [name] KGM:[id number] DOB: December 17, 1955 DOA: 03/02/2015  Referring physician:  EDP PCP: [name], MD  Specialists:   Chief Complaint: Fever Chills  HPI: Haley Lowe is a 59 y.o. female with a history of Metastatic Thyroid cancer on chemotherapy and Radiation rx with last radiation RX 4 days ago who presents to the ED with complaints of fevers chills and SOB along with poor appetite , and tongue and throat soreness.   She had been placed on oral Clindamycin this past week and then changed to Levaquin for possible pneumonia by her PCP.   She was evaluated in the ED and found to have hypoxia to the mid 80's and a pneumonia on chest X-ray.  She was placed on IV Vancomycin and Aztreonam for HCAP and referred for admission.     Review of Systems:  Constitutional: No Weight Loss, No Weight Gain, Night Sweats, +Fevers, +Chills, Dizziness, Light Headedness, Fatigue, +Generalized Weakness HEENT: No Headaches, Difficulty Swallowing,Tooth/Dental Problems,+Sore Throat,  No Sneezing, Rhinitis, Ear Ache, Nasal Congestion, or Post Nasal Drip,  Cardio-vascular:  No Chest pain, Orthopnea, PND, Edema in Lower Extremities, Anasarca, Dizziness, Palpitations  Resp: +Dyspnea, No DOE, No Productive Cough, +Non-Productive Cough, No Hemoptysis, No Wheezing.    GI: No Heartburn, Indigestion, Abdominal Pain, Nausea, Vomiting, Diarrhea, Constipation, Hematemesis, Hematochezia, Melena, Change in Bowel Habits,  +Loss of Appetite  GU: No Dysuria, No Change in Color of Urine, No Urgency or Urinary Frequency, No Flank pain.  Musculoskeletal: No Joint Pain or Swelling, No Decreased Range of Motion, No Back Pain.  Neurologic: No Syncope, No Seizures, Muscle Weakness, Paresthesia, Vision Disturbance or Loss, No Diplopia, No Vertigo, No Difficulty Walking,  Skin: No Rash or Lesions. Psych: No Change in Mood or Affect, No Depression or Anxiety, No Memory  loss, No Confusion, or Hallucinations   Past Medical History  Diagnosis Date  . Essential hypertension   . Anxiety   . Depression   . COPD (chronic obstructive pulmonary disease) ([place])   . GERD (gastroesophageal reflux disease)   . Headache(784.0)   . Arthritis   . Hx of adenomatous colonic polyps   . Emphysema   . Cataract     1 lens implaqnt right eye,intact on left eye  . Goiter   . Fibromyalgia   . Pulmonary emboli ([place]) 11/04/11    Right upper lobe and r lower lobe PE  . Lung nodule seen on imaging study 11/04/11 CT    36m LLL  . DVT [place]deep [place]) [place] 12/09/2011  . [place] 12/09[place]2011  . Hyperlipidemia   . History of radiation therapy 10/06/11    [facility]RS 15Gy 15f brain  . UTI (lower urinary tract infection)   . Borderline diabetes mellitus   . Metastatic adenocarcinoma to brain ([place]/13    Left frontoparietal region  . Thyroid cancer ([facility]  . S/P radiation therapy 05/31/14 S[facility]    Brain     Past Surgical History  Procedure Laterality Date  . Abdominal hysterectomy  1996  . Cholecystectomy  2000  . Shoulder surgery Right 1998  . Eye surgery  2001  . Foot surgery Left 2005  . Craniotomy  09/15/2011    Procedure: CRANIOTOMY TUMOR EXCISION;  Surgeon: RoHosie SpangleMD;  [facility];  Service: Neurosurgery;  Laterality: N/A;  Craniotomy resection of tumor with stealth  . Cataract extraction  2011    With lens implant  .  Total thyroidectomy  10-29-2013    [facility]  . Lymph gland excision    . Video bronchoscopy with endobronchial ultrasound N/A 04/11/2014    Procedure: VIDEO BRONCHOSCOPY WITH ENDOBRONCHIAL ULTRASOUND;  Surgeon: Melrose Nakayama, MD;  Location: [place];  Service: Thoracic;  Laterality: N/A;      Prior to Admission medications   Medication Sig Start Date End Date Taking? Authorizing Provider  ALPRAZolam Duanne Moron) 0.5 MG tablet Take 0.5 mg by mouth 3 (three) times daily as needed for  anxiety.  01/04/12  Yes Historical Provider, MD  amitriptyline (ELAVIL) 25 MG tablet Take 25 mg by mouth at bedtime.   Yes Historical Provider, MD  dexamethasone (DECADRON) 4 MG tablet Take 1 tablet (4 mg total) by mouth 2 (two) times daily. Patient taking differently: Take 4 mg by mouth daily.  02/25/15  Yes Kyung Rudd, MD  Lenvatinib 14 MG Daily Dose (LENVIMA 14 MG DAILY DOSE) 10 & 4 MG CPPK Take 14 mg by mouth daily. '10mg'$  cap and '4mg'$  cap per dose 02/20/15  Yes Historical Provider, MD  levothyroxine (SYNTHROID, LEVOTHROID) 137 MCG tablet Take 137 mcg by mouth daily before breakfast.   Yes Historical Provider, MD  magic mouthwash w/lidocaine SOLN Take 5 mLs by mouth 4 (four) times daily as needed for mouth pain. 02/25/15  Yes Kyung Rudd, MD  Magnesium 250 MG TABS Take 1 tablet by mouth daily.   Yes Historical Provider, MD  morphine (MS CONTIN) 60 MG 12 hr tablet Take 60 mg by mouth every 12 (twelve) hours.   Yes Historical Provider, MD  omeprazole (PRILOSEC) 40 MG capsule Take 1 capsule (40 mg total) by mouth daily. 12/06/14  Yes Manus Gunning, MD  ondansetron (ZOFRAN ODT) 8 MG disintegrating tablet Take 1 tablet (8 mg total) by mouth every 8 (eight) hours as needed for nausea or vomiting. 04/05/14  Yes Pattricia Boss, MD  ondansetron (ZOFRAN) 8 MG tablet Take 8 mg by mouth every 8 (eight) hours as needed for nausea or vomiting.   Yes Historical Provider, MD  Oxycodone HCl 20 MG TABS Take 1 tablet by mouth every 4 (four) hours as needed (pain).   Yes Historical Provider, MD  polyethylene glycol (MIRALAX / GLYCOLAX) packet Take 17 g by mouth every other day. For constipation   Yes Historical Provider, MD  Simethicone (GAS-X EXTRA STRENGTH PO) Take 1 tablet by mouth daily as needed (gas relief).   Yes Historical Provider, MD  Wound Dressings (SONAFINE) Apply 1 application topically daily.   Yes Historical Provider, MD  levofloxacin (LEVAQUIN) 500 MG tablet Take 1 tablet (500 mg total) by mouth  daily. 03/02/15   Daleen Bo, MD  magic mouthwash SOLN Take 10 mLs by mouth 4 (four) times daily. Swish medication in mouth and spit it out 03/02/15   Daleen Bo, MD     Allergies  Allergen Reactions  . Penicillins Swelling    " my brain swelled, and mymouth" Has patient had a PCN reaction causing immediate rash, facial/tongue/throat swelling, SOB or lightheadedness with hypotension: No Has patient had a PCN reaction causing severe rash involving mucus membranes or skin necrosis: No Has patient had a PCN reaction that required hospitalization No Has patient had a PCN reaction occurring within the last 10 years: No If all of the above answers are "NO", then may proceed with Cephalosporin use.   . Other Swelling    PT ALSO REACTS TO PAPER TAPE AND BAND-AIDS.  Marland Kitchen Codeine Itching  Mild takes benadryl  . Tape Rash    Social History:  reports that she quit smoking about 3 years ago. Her smoking use included Cigarettes. She has a 40 pack-year smoking history. She has never used smokeless tobacco. She reports that she does not drink alcohol or use illicit drugs.    Family History  Problem Relation Age of Onset  . Asthma Mother   . Kidney failure Father   . Diabetes Sister   . Heart attack Sister   . Colon cancer Brother   . COPD Sister   . Aneurysm Paternal Grandmother     Brain  . Parkinsonism Maternal Uncle   . COPD Brother        Physical Exam:  GEN:  Pleasant Obese ill Appearing  59 y.o.  Caucasianfemale examined and in no acute distress; cooperative with exam Filed Vitals:   03/02/15 2222 03/02/15 2244 03/03/15 0151 03/03/15 0208  BP: 137/87     Pulse: 96     Temp: 100.1 F (37.8 C) 102.5 F (39.2 C) 99.9 F (37.7 C)   TempSrc: Oral Rectal Oral   Resp: 18     SpO2: 93%   89%   Blood pressure 137/87, pulse 96, temperature 99.9 F (37.7 C), temperature source Oral, resp. rate 18, SpO2 89 %. PSYCH: She is alert and oriented x4; does not appear anxious does not  appear depressed; affect is normal HEENT: Normocephalic and Atraumatic, Mucous membranes pink; PERRLA; EOM intact; Fundi:  Benign;  No scleral icterus, Nares: Patent, Oropharynx:  + Ulcerated Tongue Fair Dentition,    Neck:  FROM, No Cervical Lymphadenopathy nor Thyromegaly or Carotid Bruit; No JVD; Breasts:: Not examined CHEST WALL: No tenderness CHEST: Normal respiration, clear to auscultation bilaterally HEART: Regular rate and rhythm; no murmurs rubs or gallops BACK: No kyphosis or scoliosis; No CVA tenderness ABDOMEN: Positive Bowel Sounds, Obese, Soft Non-Tender, No Rebound or Guarding; No Masses, No Organomegaly, No Pannus; No Intertriginous candida. Rectal Exam: Not done EXTREMITIES: No Cyanosis, Clubbing, or Edema; No Ulcerations. Genitalia: not examined PULSES: 2+ and symmetric SKIN: Normal hydration no rash or ulceration CNS:  Alert and Oriented x 4, No Focal Deficits Vascular: pulses palpable throughout    Labs on Admission:  Basic Metabolic Panel:  Recent Labs Lab 03/02/15 0815 03/02/15 2359  NA 132* 132*  K 4.6 3.1*  CL 96* 98*  CO2 28 21*  GLUCOSE 132* 85  BUN 13 12  CREATININE 0.53 0.42*  CALCIUM 8.3* 8.1*   Liver Function Tests:  Recent Labs Lab 03/02/15 2359  AST 23  ALT 29  ALKPHOS 51  BILITOT 1.3*  PROT 6.1*  ALBUMIN 2.9*   No results for input(s): LIPASE, AMYLASE in the last 168 hours. No results for input(s): AMMONIA in the last 168 hours. CBC:  Recent Labs Lab 03/02/15 0815 03/02/15 2359  WBC 2.5* 1.7*  NEUTROABS 2.2 1.4*  HGB 14.9 14.9  HCT 44.3 44.6  MCV 87.5 86.8  PLT 69* 59*   Cardiac Enzymes:  Recent Labs Lab 03/02/15 2359  TROPONINI 0.05*    BNP (last 3 results) No results for input(s): BNP in the last 8760 hours.  ProBNP (last 3 results) No results for input(s): PROBNP in the last 8760 hours.  CBG: No results for input(s): GLUCAP in the last 168 hours.  Radiological Exams on Admission: Dg Chest 2  View  03/03/2015  CLINICAL DATA:  Acute onset of fever, cough, and mouth and throat sores. Status post recent radiation therapy  for thyroid cancer. Recently diagnosed with pneumonia. Initial encounter. EXAM: CHEST  2 VIEW COMPARISON:  Chest radiograph performed 03/02/2015 FINDINGS: Persistent bibasilar airspace opacities, worse on the left, raise concern for pneumonia, similar in appearance to the prior study. No pleural effusion or pneumothorax is seen. The heart remains normal in size. No acute osseous abnormalities are identified. IMPRESSION: Persistent bibasilar airspace opacities, worse on the left, raise concern for mild pneumonia, similar appearance to the prior study. Electronically Signed   By: Garald Balding M.D.   On: 03/03/2015 00:56   Dg Chest 2 View  03/02/2015  CLINICAL DATA:  Cough, congestion and sore mild as patient just completed radiation treatment for thyroid cancer. EXAM: CHEST  2 VIEW COMPARISON:  01/01/2015 FINDINGS: Lungs are adequately inflated and demonstrate worsening bibasilar heterogeneous density with bronchial thickening. No evidence of effusion. Stable borderline cardiomegaly. There is calcified plaque over the aortic arch. There are degenerative changes of the spine. IMPRESSION: Worsening heterogeneous bibasilar opacification with bronchial thickening as findings may be due to bronchitis, although cannot exclude pneumonia. Electronically Signed   By: Marin Olp M.D.   On: 03/02/2015 08:48     EKG: Independently reviewed. Normal Sinus Rhythm Rate = 85       Assessment/Plan:   59 y.o. female with  Active Problems:   1.    Early Sepsis (HCC)/HCAP (healthcare-associated pneumonia)   Sepsis Workup   IV Vancomycin and Aztreonam     2.    Mucositis due to radiation therapy   Viscous Lidocaine PRN   Pain Control PRN     3.    Metastatic adenocarcinoma to brain, thyroid cancer   Treatment by Oncology at  [facility]     4.    COPD (chronic obstructive  pulmonary disease) (HCC)   DuoNebs PRN     5.    Leukopenia- due to Chemotherapy   Monitor Trend   Neutropenic Precautions     6.    Thrombocytopenia - due to Chemotherapy   Monitor Trend     7.     Hypokalemia   Replace IV   Check Magnesium     8.     Hyponatremia   IVFs with NSS   Monitor Na+ levels     9.     Hypothyroid- on replacement   Check TSH level   Continue Levothyroxine Rx    10.     DVT Prophylaxis   SCDs    Code Status:     FULL CODE     Family Communication:   Husband at Bedside     Disposition Plan:    Inpatient Status        Time spent:  [facility] Pager [phone]   If Effort Please Contact the Day Rounding Team MD for [facility]  If 7PM-7AM, Please Contact Night-Floor Coverage  [email or web address] Password [email or web address] 03/03/2015, 2:50 AM     ADDENDUM:   Patient was seen and examined on 03/03/2015

## 2015-03-03 NOTE — Progress Notes (Signed)
[facility] PROGRESS NOTE  Haley Lowe NWG:[id number] DOB: [birth year] DOA: 03/02/2015 PCP: [name], MD   Addendum to admission H&P from this morning.  59 year old female with metastatic thyroid cancer on chemotherapy and radiation (last radiation was 4 days prior to admission) presented to the ED with subjective fevers with chills and shortness of breath associated with poor appetite, pain in her tongue and throat with difficulty swallowing. She was on oral clindamycin which was then changed to Levaquin for possible pneumonia by her PCP 1 week back. In the ED patient was septic with a rate of 102.20F, hypertensive and dropping O2 sat to mid 80s on room air. She was pancytopenic with a chest x-ray showing bibasilar opacity (L>R) . Lactic acid was normal. Sepsis pathway initiated and Patient started on empiric IV antibiotics. Admitted to hospitalist service on telemetry.  Assessment/Plan: Sepsis secondary to healthcare associated pneumonia Patient immunocompromised with underlying thyroid cancer receiving chemotherapy and radiation. Also has severe mucositis compromising her swallowing and a possible oropharyngeal candidiasis.  -Continue empiric vancomycin and aztreonam. Follow blood cultures, urine for strep antigen, Legionella antigen and IV antibody. -Continue aggressive IV hydration. Clear liquid as tolerated.  Severe mucositis Secondary to radiation. Continue when necessary viscous lidocaine solution and Magic mouthwash. Will add Diflucan for possible oropharyngeal candidiasis. (Patient complaining of odynophagia).  Metastatic thyroid carcinoma to the brain S/P  post surgical resection and currently receiving chemotherapy and radiation. Follows with Dr. Roxan Hockey at [facility] and Dr. Lisbeth Renshaw at [place] long . Continue Decadron. Continue PPI. Resume home pain medications. Consults oncology as needed.  Pancytopenia Secondary to chemotherapy. Monitor for  now.  Hypokalemia Replenished  Anxiety Continue Xanax.   DVT prophylaxis: SCDs Diet: Clear liquid   Code Status: Full code Family Communication: None at bedside Disposition Plan: Currently inpatient. home once symptoms improve. Possibly in the next 3-4 days   Consultants: None   Procedures:  None  Antibiotics:  IV vancomycin and aztreonam 11/28--  HPI/Subjective: Seen and examined. Denies any shortness of breath but reports pain in her tongue and her throat with difficulty swallowing.  Objective: Filed Vitals:   03/03/15 1300 03/03/15 1530  BP: 152/99 124/90  Pulse: 76 79  Temp: 98.7 F (37.1 C) 98.2 F (36.8 C)  Resp: 18 18    Intake/Output Summary (Last 24 hours) at 03/03/15 1532 Last data filed at 03/03/15 0655  Gross per 24 hour  Intake   1085 ml  Output      0 ml  Net   1085 ml   Filed Weights   03/03/15 0520  Weight: 66.225 kg (146 lb)    Exam:   General:  Middle aged female not in distress but appears fatigued  HEENT: Pallor, mucositis of the tongue, no visible thrush,  Cardiovascular: Normal S1 and S2, no murmurs rub or gallop  Respiratory: Bibasilar crackles, no rhonchi or wheeze  Abdomen: Soft, nondistended, nontender, bowel sounds present  Musculoskeletal: Warm, no  edema  CNS: Alert and oriented   Data Reviewed: Basic Metabolic Panel:  Recent Labs Lab 03/02/15 0815 03/02/15 2359 03/03/15 0622  NA 132* 132* 135  K 4.6 3.1* 3.5  CL 96* 98* 103  CO2 28 21* 21*  GLUCOSE 132* 85 141*  BUN '13 12 11  '$ CREATININE 0.53 0.42* 0.44  CALCIUM 8.3* 8.1* 7.6*   Liver Function Tests:  Recent Labs Lab 03/02/15 2359  AST 23  ALT 29  ALKPHOS 51  BILITOT 1.3*  PROT 6.1*  ALBUMIN 2.9*   No  results for input(s): LIPASE, AMYLASE in the last 168 hours. No results for input(s): AMMONIA in the last 168 hours. CBC:  Recent Labs Lab 03/02/15 0815 03/02/15 2359 03/03/15 0622  WBC 2.5* 1.7* 2.6*  NEUTROABS 2.2 1.4*  --   HGB  14.9 14.9 13.8  HCT 44.3 44.6 41.8  MCV 87.5 86.8 88.0  PLT 69* 59* 60*   Cardiac Enzymes:  Recent Labs Lab 03/02/15 2359  TROPONINI 0.05*   BNP (last 3 results) No results for input(s): BNP in the last 8760 hours.  ProBNP (last 3 results) No results for input(s): PROBNP in the last 8760 hours.  CBG: No results for input(s): GLUCAP in the last 168 hours.  Recent Results (from the past 240 hour(s))  Culture, blood (routine x 2)     Status: None (Preliminary result)   Collection Time: 03/02/15 11:59 PM  Result Value Ref Range Status   Specimen Description BLOOD RIGHT HAND  Final   Special Requests BOTTLES DRAWN AEROBIC AND ANAEROBIC 6CC  Final   Culture NO GROWTH < 12 HOURS  Final   Report Status PENDING  Incomplete  Culture, blood (routine x 2)     Status: None (Preliminary result)   Collection Time: 03/03/15 12:09 AM  Result Value Ref Range Status   Specimen Description BLOOD LEFT HAND  Final   Special Requests BOTTLES DRAWN AEROBIC AND ANAEROBIC 6CC  Final   Culture NO GROWTH < 12 HOURS  Final   Report Status PENDING  Incomplete     Studies: Dg Chest 2 View  03/03/2015  CLINICAL DATA:  Acute onset of fever, cough, and mouth and throat sores. Status post recent radiation therapy for thyroid cancer. Recently diagnosed with pneumonia. Initial encounter. EXAM: CHEST  2 VIEW COMPARISON:  Chest radiograph performed 03/02/2015 FINDINGS: Persistent bibasilar airspace opacities, worse on the left, raise concern for pneumonia, similar in appearance to the prior study. No pleural effusion or pneumothorax is seen. The heart remains normal in size. No acute osseous abnormalities are identified. IMPRESSION: Persistent bibasilar airspace opacities, worse on the left, raise concern for mild pneumonia, similar appearance to the prior study. Electronically Signed   By: Garald Balding M.D.   On: 03/03/2015 00:56   Dg Chest 2 View  03/02/2015  CLINICAL DATA:  Cough, congestion and sore mild  as patient just completed radiation treatment for thyroid cancer. EXAM: CHEST  2 VIEW COMPARISON:  01/01/2015 FINDINGS: Lungs are adequately inflated and demonstrate worsening bibasilar heterogeneous density with bronchial thickening. No evidence of effusion. Stable borderline cardiomegaly. There is calcified plaque over the aortic arch. There are degenerative changes of the spine. IMPRESSION: Worsening heterogeneous bibasilar opacification with bronchial thickening as findings may be due to bronchitis, although cannot exclude pneumonia. Electronically Signed   By: Marin Olp M.D.   On: 03/02/2015 08:48    Scheduled Meds: . amitriptyline  25 mg Oral QHS  . aztreonam  2 g Intravenous 3 times per day  . dexamethasone  2 mg Intravenous Q12H  . fluconazole (DIFLUCAN) IV  100 mg Intravenous Q24H  . magic mouthwash  10 mL Oral QID  . magnesium oxide  400 mg Oral Daily  . morphine  60 mg Oral Q12H  . pantoprazole (PROTONIX) IV  40 mg Intravenous Q24H  . polyethylene glycol  17 g Oral QODAY  . sodium chloride  3 mL Intravenous Q12H  . SONAFINE  1 application Topical Daily  . vancomycin  750 mg Intravenous Q12H   Continuous Infusions: .  sodium chloride 1,000 mL (03/03/15 0017)  . sodium chloride 75 mL/hr at 03/03/15 0547      Time spent: 25 minutes    Louellen Molder  [facility] Pager [phone]. If 7PM-7AM, please contact night-coverage at [email or web address], password [facility] 03/03/2015, 3:32 PM  LOS: 0 days

## 2015-03-03 NOTE — Progress Notes (Signed)
ANTIBIOTIC CONSULT NOTE - INITIAL  Pharmacy Consult for aztreonam and vancomycin Indication: pneumonia  Allergies  Allergen Reactions  . Penicillins Swelling    " my brain swelled, and mymouth" Has patient had a PCN reaction causing immediate rash, facial/tongue/throat swelling, SOB or lightheadedness with hypotension: No Has patient had a PCN reaction causing severe rash involving mucus membranes or skin necrosis: No Has patient had a PCN reaction that required hospitalization No Has patient had a PCN reaction occurring within the last 10 years: No If all of the above answers are "NO", then may proceed with Cephalosporin use.   . Other Swelling    PT ALSO REACTS TO PAPER TAPE AND BAND-AIDS.  Marland Kitchen Codeine Itching    Mild takes benadryl  . Tape Rash    Patient Measurements: Height: '5\' 3"'$  (160 cm) Weight: 146 lb (66.225 kg) IBW/kg (Calculated) : 52.4   Vital Signs: Temp: 98.2 F (36.8 C) (11/28 0730) Temp Source: Oral (11/28 0730) BP: 129/86 mmHg (11/28 0730) Pulse Rate: 67 (11/28 0730) Intake/Output from previous day: 11/27 0701 - 11/28 0700 In: 1085 [I.V.:1085] Out: -  Intake/Output from this shift:    Labs:  Recent Labs  03/02/15 0815 03/02/15 2359 03/03/15 0622  WBC 2.5* 1.7* 2.6*  HGB 14.9 14.9 13.8  PLT 69* 59* 60*  CREATININE 0.53 0.42* 0.44   Estimated Creatinine Clearance: 69.2 mL/min (by C-G formula based on Cr of 0.44). No results for input(s): VANCOTROUGH, VANCOPEAK, VANCORANDOM, GENTTROUGH, GENTPEAK, GENTRANDOM, TOBRATROUGH, TOBRAPEAK, TOBRARND, AMIKACINPEAK, AMIKACINTROU, AMIKACIN in the last 72 hours.   Microbiology: Recent Results (from the past 720 hour(s))  Culture, blood (routine x 2)     Status: None (Preliminary result)   Collection Time: 03/02/15 11:59 PM  Result Value Ref Range Status   Specimen Description BLOOD RIGHT HAND  Final   Special Requests BOTTLES DRAWN AEROBIC AND ANAEROBIC 6CC  Final   Culture PENDING  Incomplete   Report  Status PENDING  Incomplete  Culture, blood (routine x 2)     Status: None (Preliminary result)   Collection Time: 03/03/15 12:09 AM  Result Value Ref Range Status   Specimen Description BLOOD LEFT HAND  Final   Special Requests BOTTLES DRAWN AEROBIC AND ANAEROBIC 6CC  Final   Culture PENDING  Incomplete   Report Status PENDING  Incomplete    Medical History: Past Medical History  Diagnosis Date  . Essential hypertension   . Anxiety   . Depression   . COPD (chronic obstructive pulmonary disease) ([place])   . GERD (gastroesophageal reflux disease)   . Headache(784.0)   . Arthritis   . Hx of adenomatous colonic polyps   . Emphysema   . Cataract     1 lens implaqnt right eye,intact on left eye  . Goiter   . Fibromyalgia   . Pulmonary emboli ([place]) 11/04/11    Right upper lobe and r lower lobe PE  . Lung nodule seen on imaging study 11/04/11 CT    9m LLL  . DVT (deep venous thrombosis) ([facility] 12/09/2011  . [place]  . Hyperlipidemia   . History of radiation therapy 10/06/11    SRS 15Gy 18f brain  . UTI (lower urinary tract infection)   . Borderline diabetes mellitus   . Metastatic adenocarcinoma to brain ([facility]    Left frontoparietal region  . Thyroid cancer ([facility]  . S/P radiation therapy 05/31/14 SRS    Brain    Medications:  Prescriptions prior to admission  Medication Sig Dispense Refill Last Dose  . ALPRAZolam (XANAX) 0.5 MG tablet Take 0.5 mg by mouth 3 (three) times daily as needed for anxiety.    03/01/2015 at Unknown time  . amitriptyline (ELAVIL) 25 MG tablet Take 25 mg by mouth at bedtime.   03/01/2015 at Unknown time  . dexamethasone (DECADRON) 4 MG tablet Take 1 tablet (4 mg total) by mouth 2 (two) times daily. (Patient taking differently: Take 4 mg by mouth daily. ) 60 tablet 0 unknown  . Lenvatinib 14 MG Daily Dose (LENVIMA 14 MG DAILY DOSE) 10 & 4 MG CPPK Take 14 mg by mouth daily. '10mg'$  cap and '4mg'$  cap per dose   03/02/2015 at  Unknown time  . levothyroxine (SYNTHROID, LEVOTHROID) 137 MCG tablet Take 137 mcg by mouth daily before breakfast.   03/01/2015 at Unknown time  . magic mouthwash w/lidocaine SOLN Take 5 mLs by mouth 4 (four) times daily as needed for mouth pain. 400 mL 0 03/01/2015 at Unknown time  . Magnesium 250 MG TABS Take 1 tablet by mouth daily.   03/01/2015 at Unknown time  . morphine (MS CONTIN) 60 MG 12 hr tablet Take 60 mg by mouth every 12 (twelve) hours.   03/01/2015 at Unknown time  . omeprazole (PRILOSEC) 40 MG capsule Take 1 capsule (40 mg total) by mouth daily. 90 capsule 3 03/01/2015 at Unknown time  . ondansetron (ZOFRAN ODT) 8 MG disintegrating tablet Take 1 tablet (8 mg total) by mouth every 8 (eight) hours as needed for nausea or vomiting. 20 tablet 0 03/01/2015 at Unknown time  . ondansetron (ZOFRAN) 8 MG tablet Take 8 mg by mouth every 8 (eight) hours as needed for nausea or vomiting.   unknown  . Oxycodone HCl 20 MG TABS Take 1 tablet by mouth every 4 (four) hours as needed (pain).   03/02/2015 at Unknown time  . polyethylene glycol (MIRALAX / GLYCOLAX) packet Take 17 g by mouth every other day. For constipation   unknown  . Simethicone (GAS-X EXTRA STRENGTH PO) Take 1 tablet by mouth daily as needed (gas relief).   unknown  . Wound Dressings (SONAFINE) Apply 1 application topically daily.   03/02/2015 at Unknown time  . levofloxacin (LEVAQUIN) 500 MG tablet Take 1 tablet (500 mg total) by mouth daily. 7 tablet 0   . magic mouthwash SOLN Take 10 mLs by mouth 4 (four) times daily. Swish medication in mouth and spit it out 400 mL 0    Assessment: 59 yo lady to start broad spectrum antibiotics for PNA, sepsis.  Goal of Therapy:  Vancomycin trough level 15-20 mcg/ml  Plan:  Aztreonam 2 gm IV q8 hours Vancomycin 750 mg IV q12 hours F/u renal function, cultures and clinical course  Thanks for allowing pharmacy to be a part of this patient's care.  Excell Seltzer, PharmD Clinical  Pharmacist 03/03/2015,9:38 AM

## 2015-03-04 DIAGNOSIS — E039 Hypothyroidism, unspecified: Secondary | ICD-10-CM

## 2015-03-04 LAB — CBC
HCT: 41.7 % (ref 36.0–46.0)
HEMOGLOBIN: 13.8 g/dL (ref 12.0–15.0)
MCH: 29.1 pg (ref 26.0–34.0)
MCHC: 33.1 g/dL (ref 30.0–36.0)
MCV: 88 fL (ref 78.0–100.0)
Platelets: 62 10*3/uL — ABNORMAL LOW (ref 150–400)
RBC: 4.74 MIL/uL (ref 3.87–5.11)
RDW: 15.9 % — AB (ref 11.5–15.5)
WBC: 1.9 10*3/uL — AB (ref 4.0–10.5)

## 2015-03-04 LAB — URINE CULTURE

## 2015-03-04 MED ORDER — PANTOPRAZOLE SODIUM 40 MG PO TBEC
80.0000 mg | DELAYED_RELEASE_TABLET | Freq: Every day | ORAL | Status: DC
  Administered 2015-03-05 – 2015-03-06 (×2): 80 mg via ORAL
  Filled 2015-03-04 (×2): qty 2

## 2015-03-04 MED ORDER — LEVOTHYROXINE SODIUM 25 MCG PO TABS
137.0000 ug | ORAL_TABLET | Freq: Every day | ORAL | Status: DC
  Administered 2015-03-05 – 2015-03-08 (×3): 137 ug via ORAL
  Filled 2015-03-04 (×4): qty 1

## 2015-03-04 MED ORDER — NONFORMULARY OR COMPOUNDED ITEM
60.0000 mg | Freq: Two times a day (BID) | Status: DC
  Administered 2015-03-04: 60 mg via ORAL

## 2015-03-04 NOTE — Progress Notes (Addendum)
RN and patient counted out patient's morphine 60 mg pills to send to pharmacy, eight pills got wet and and patient stated to throw them away. The medicine was wasted in the sharps container and witnessed by the patient. Carroll Kinds RN made aware and went with RN back to patient's room and verified with patient the medication wasted.

## 2015-03-04 NOTE — Care Management Note (Signed)
Case Management Note  Patient Details  Name: Haley Lowe MRN: [id number] Date of Birth: [birth year]  Subjective/Objective:                  Pt is from home, lives with her husband and has strong family support. Pt is currently undergoing radiation for met thyroid cancer and was admitted for HCAP. Pt is ind with ADL's at baseline. Pt has no HH services or DME's prior to admission.  Action/Plan: Pt plans to return home with self care at DC. No CM needs anticipated, will cont to follow.   Expected Discharge Date:    03/05/2015              Expected Discharge Plan:  Home/Self Care  In-House Referral:  NA  Discharge planning Services  CM Consult  Post Acute Care Choice:  NA Choice offered to:  NA  DME Arranged:    DME Agency:     HH Arranged:    HH Agency:     Status of Service:  In process, will continue to follow  Medicare Important Message Given:    Date Medicare IM Given:    Medicare IM give by:    Date Additional Medicare IM Given:    Additional Medicare Important Message give by:     If discussed at [place] of Stay Meetings, dates discussed:    Additional Comments:  Sherald Barge, RN 03/04/2015, 9:34 AM

## 2015-03-04 NOTE — Progress Notes (Signed)
PROGRESS NOTE  Haley Lowe TIW:[id number] DOB: 1955/04/09 DOA: 03/02/2015 PCP: [name], MD  HPI/Recap of past 47 hours:  59 year old female with past medical history of metastatic thyroid cancer and chemotherapy plus radiation admitted on 11/27 for fever, shortness of breath and found to be septic secondary to possible pneumonia. Patient pancytopenic on admission.  Patient overall feeling a little bit better. Breathing a little easier. Her biggest complaint is of oral ulcers and mucositis  Assessment/Plan: Active Problems:   Metastatic adenocarcinoma to brain, thyroid cancer: Continue Decadron and PPI. Continue chemotherapy. Being followed by radiation oncology post discharge   Sepsis ([place]) secondary to  HCAP (healthcare-associated pneumonia): Patient meets criteria for sepsis given fever, pulmonary source, tachycardia and tachypnea. Has responded well to IV fluids and antibiotics. Lactic acid level has remained normal. Following blood cultures and awaiting legionella and strep antigen. On vancomycin and aztreonam.   Mucositis due to radiation therapy plus possible oropharyngeal candidiasis: Tolerating lidocaine and Magic mouthwash. On Diflucan.   COPD (chronic obstructive pulmonary disease) ([facility])  Pancytopenia: Secondary chemotherapy. Initial improvement, but numbers back down today. Her oncologist is at [facility]. If her white blood cell count further declines, we'll consult oncology   Hypothyroid: Continue Synthroid   Code Status: Full code   Family Communication: Husband at the bedside   Disposition Plan: Slowly improving. Anticipate being in hospital for the next one to 2 days. Awaiting physical therapy evaluation.    Consultants:  None   Procedures:  None   Antibiotics:  IV Aztreonam: 11/27-present  IV vancomycin 11/27-present    Objective: BP 158/104 mmHg  Pulse 77  Temp(Src) 98.2 F (36.8 C) (Oral)  Resp 20  Ht '5\' 3"'$  (1.6 m)  Wt 66.225 kg  (146 lb)  BMI 25.87 kg/m2  SpO2 96%  Intake/Output Summary (Last 24 hours) at 03/04/15 1642 Last data filed at 03/04/15 1500  Gross per 24 hour  Intake 2906.25 ml  Output   1300 ml  Net 1606.25 ml   Filed Weights   03/03/15 0520  Weight: 66.225 kg (146 lb)    Exam:   General:  Alert and oriented 3, frail   Cardiovascular: Regular rate and rhythm, S1-S2   Respiratory: Clear to auscultation bilaterally, decreased at the bases   Abdomen: Soft, nontender, nondistended, positive bowel sounds   Musculoskeletal: No clubbing or cyanosis or edema    Data Reviewed: Basic Metabolic Panel:  Recent Labs Lab 03/02/15 0815 03/02/15 2359 03/03/15 0622  NA 132* 132* 135  K 4.6 3.1* 3.5  CL 96* 98* 103  CO2 28 21* 21*  GLUCOSE 132* 85 141*  BUN '13 12 11  '$ CREATININE 0.53 0.42* 0.44  CALCIUM 8.3* 8.1* 7.6*   Liver Function Tests:  Recent Labs Lab 03/02/15 2359  AST 23  ALT 29  ALKPHOS 51  BILITOT 1.3*  PROT 6.1*  ALBUMIN 2.9*   No results for input(s): LIPASE, AMYLASE in the last 168 hours. No results for input(s): AMMONIA in the last 168 hours. CBC:  Recent Labs Lab 03/02/15 0815 03/02/15 2359 03/03/15 0622 03/04/15 0938  WBC 2.5* 1.7* 2.6* 1.9*  NEUTROABS 2.2 1.4*  --   --   HGB 14.9 14.9 13.8 13.8  HCT 44.3 44.6 41.8 41.7  MCV 87.5 86.8 88.0 88.0  PLT 69* 59* 60* 62*   Cardiac Enzymes:    Recent Labs Lab 03/02/15 2359  TROPONINI 0.05*   BNP (last 3 results) No results for input(s): BNP in the last 8760 hours.  ProBNP (last 3 results) No results for input(s): PROBNP in the last 8760 hours.  CBG: No results for input(s): GLUCAP in the last 168 hours.  Recent Results (from the past 240 hour(s))  Urine culture     Status: None   Collection Time: 03/02/15 10:55 PM  Result Value Ref Range Status   Specimen Description URINE, CLEAN CATCH  Final   Special Requests NONE  Final   Culture   Final    MULTIPLE SPECIES PRESENT, SUGGEST  RECOLLECTION Performed at [facility]    Report Status 03/04/2015 FINAL  Final  Culture, blood (routine x 2)     Status: None (Preliminary result)   Collection Time: 03/02/15 11:59 PM  Result Value Ref Range Status   Specimen Description BLOOD RIGHT HAND  Final   Special Requests BOTTLES DRAWN AEROBIC AND ANAEROBIC 6CC  Final   Culture NO GROWTH 1 DAY  Final   Report Status PENDING  Incomplete  Culture, blood (routine x 2)     Status: None (Preliminary result)   Collection Time: 03/03/15 12:09 AM  Result Value Ref Range Status   Specimen Description BLOOD LEFT HAND  Final   Special Requests BOTTLES DRAWN AEROBIC AND ANAEROBIC 6CC  Final   Culture NO GROWTH 1 DAY  Final   Report Status PENDING  Incomplete     Studies: No results found.  Scheduled Meds: . amitriptyline  25 mg Oral QHS  . aztreonam  2 g Intravenous 3 times per day  . dexamethasone  2 mg Intravenous Q12H  . fluconazole (DIFLUCAN) IV  100 mg Intravenous Q24H  . magic mouthwash  10 mL Oral QID  . magnesium oxide  400 mg Oral Daily  . NONFORMULARY OR COMPOUNDED ITEM 60 mg  60 mg Oral BID  . pantoprazole (PROTONIX) IV  40 mg Intravenous Q24H  . polyethylene glycol  17 g Oral QODAY  . sodium chloride  3 mL Intravenous Q12H  . SONAFINE  1 application Topical Daily  . vancomycin  750 mg Intravenous Q12H    Continuous Infusions: . sodium chloride 1,000 mL (03/03/15 0017)  . sodium chloride 75 mL/hr at 03/04/15 0549     Time spent: 25 minutes   [facility] Pager [phone] . If 7PM-7AM, please contact night-coverage at [email or web address], password [facility] 03/04/2015, 4:42 PM  LOS: 1 day

## 2015-03-05 DIAGNOSIS — D61818 Other pancytopenia: Secondary | ICD-10-CM

## 2015-03-05 LAB — CBC
HEMATOCRIT: 41.6 % (ref 36.0–46.0)
Hemoglobin: 13.6 g/dL (ref 12.0–15.0)
MCH: 28.8 pg (ref 26.0–34.0)
MCHC: 32.7 g/dL (ref 30.0–36.0)
MCV: 88.1 fL (ref 78.0–100.0)
Platelets: 72 10*3/uL — ABNORMAL LOW (ref 150–400)
RBC: 4.72 MIL/uL (ref 3.87–5.11)
RDW: 15.8 % — AB (ref 11.5–15.5)
WBC: 1.9 10*3/uL — AB (ref 4.0–10.5)

## 2015-03-05 LAB — DIFFERENTIAL
BASOS ABS: 0 10*3/uL (ref 0.0–0.1)
BASOS PCT: 0 %
EOS ABS: 0 10*3/uL (ref 0.0–0.7)
EOS PCT: 1 %
LYMPHS PCT: 12 %
Lymphs Abs: 0.2 10*3/uL — ABNORMAL LOW (ref 0.7–4.0)
MONO ABS: 0.1 10*3/uL (ref 0.1–1.0)
MONOS PCT: 4 %
NEUTROS ABS: 1.5 10*3/uL — AB (ref 1.7–7.7)
Neutrophils Relative %: 83 %

## 2015-03-05 LAB — EXPECTORATED SPUTUM ASSESSMENT W REFEX TO RESP CULTURE

## 2015-03-05 LAB — LEGIONELLA PNEUMOPHILA SEROGP 1 UR AG: L. pneumophila Serogp 1 Ur Ag: NEGATIVE

## 2015-03-05 LAB — HIV ANTIBODY (ROUTINE TESTING W REFLEX): HIV Screen 4th Generation wRfx: NONREACTIVE

## 2015-03-05 LAB — EXPECTORATED SPUTUM ASSESSMENT W GRAM STAIN, RFLX TO RESP C

## 2015-03-05 MED ORDER — LEVOFLOXACIN IN D5W 750 MG/150ML IV SOLN
750.0000 mg | INTRAVENOUS | Status: DC
  Administered 2015-03-05 – 2015-03-11 (×7): 750 mg via INTRAVENOUS
  Filled 2015-03-05 (×7): qty 150

## 2015-03-05 MED ORDER — HOME MED STORE IN PYXIS
1.0000 | Freq: Two times a day (BID) | Status: DC
  Administered 2015-03-06 – 2015-03-12 (×9): 1 via ORAL
  Filled 2015-03-05 (×19): qty 1

## 2015-03-05 MED ORDER — MORPHINE SULFATE ER 60 MG PO TBCR: 60.0000 mg | EXTENDED_RELEASE_TABLET | Freq: Two times a day (BID) | ORAL | Status: DC

## 2015-03-05 NOTE — Evaluation (Signed)
Physical Therapy Evaluation Patient Details Name: Haley Lowe MRN: [id number] DOB: 1956/03/26 Today's Date: 03/05/2015   History of Present Illness  Pt is a 59yo white female currently undergoing cancer treatment, who presents with pancytopenia and SOB, found to by hypoxic. Pt being treated for HCAP, received on 3L for PT eval. Pt is not on Supplemental O2 at home.   Clinical Impression  Pt is received semirecumbent in bed upon entry, awake, alert, and willing to participate. No acute distress noted. Pt is A&Ox3 and pleasant. Pt reports two falls in the last 6 months, tripping over some items in home, but reports that her balance is otherwise quite good. Assessment of functional mobility demonstrating mildly weak BLE, but patient is able to perform all mobility with modified independence, safely. In depth balance screening falls to yield any LOB for patient, demonstrating balance at baseline status. Pt remaining on 3L O2 throughout evaluation, but desaturating with activity (94%) as well as mobility (94%), whereas pt is not on O2 at baseline at home. Patient presenting with impairment of oxygen perfusion and activity tolerance, limiting ability to perform ADL and mobility tasks at  baseline level of function. Patient will benefit from skilled intervention to address the above impairments and limitations, in order to restore to prior level of function, improve patient safety upon discharge, and to decrease falls risk. All skilled services and education can be met at this venue of care. The PT not recommending any additional services s/p DC to home. Pt is appropriate and safe for DC to home with intermittent assistance with IADL PRN.      Follow Up Recommendations Supervision - Intermittent;No PT follow up    Equipment Recommendations  None recommended by PT    Recommendations for Other Services       Precautions / Restrictions Precautions Precaution Comments: Protection Precautions-  Pancytopenia Restrictions Weight Bearing Restrictions: No      Mobility  Bed Mobility Overal bed mobility: Modified Independent                Transfers Overall transfer level: Modified independent Equipment used: None             General transfer comment: 5x STS in 18sec c O2Sats >94% on 3L.   Ambulation/Gait Ambulation/Gait assistance: Supervision Ambulation Distance (Feet): 40 Feet Assistive device: None     Gait velocity interpretation: <1.8 ft/sec, indicative of risk for recurrent falls General Gait Details: Decreased step length bilat, appears stable alternating AMB and retroambulation; pt attests to being near baseline  Stairs            Wheelchair Mobility    Modified Rankin (Stroke Patients Only)       Balance Overall balance assessment: History of Falls (2 falls recently; not LOB with advanced balance screening. )                                           Pertinent Vitals/Pain Pain Assessment: No/denies pain (Reports Chronic mouth/throat pain. )    Home Living Family/patient expects to be discharged to:: Private residence Living Arrangements: Spouse/significant other Available Help at Discharge: Family Type of Home: House Home Access: Stairs to enter Entrance Stairs-Rails: None Entrance Stairs-Number of Steps: 4 Home Layout: One level Home Equipment: None      Prior Function Level of Independence: Independent         Comments: Modified independence,  requiring additional time at onset of cancer treatments due to fatigue.      Hand Dominance   Dominant Hand: Right    Extremity/Trunk Assessment   Upper Extremity Assessment: Overall WFL for tasks assessed           Lower Extremity Assessment: Generalized weakness;Overall WFL for tasks assessed (5x Sit to stand performed in 18 seconds, O2Sats >92% on 3L. )      Cervical / Trunk Assessment: Normal  Communication   Communication: No difficulties   Cognition Arousal/Alertness: Awake/alert Behavior During Therapy: WFL for tasks assessed/performed Overall Cognitive Status: Within Functional Limits for tasks assessed                      General Comments      Exercises        Assessment/Plan    PT Assessment    PT Diagnosis     PT Problem List    PT Treatment Interventions     PT Goals (Current goals can be found in the Care Plan section) Acute Rehab PT Goals Patient Stated Goal: Return to home, resume CA treatments as planned  PT Goal Formulation: With patient Time For Goal Achievement: 03/19/15 Potential to Achieve Goals: Good    Frequency     Barriers to discharge        Co-evaluation               End of Session                 Time: 1415-1435 PT Time Calculation (min) (ACUTE ONLY): 20 min   Charges:   PT Evaluation $Initial PT Evaluation Tier I: 1 Procedure     PT G Codes:        Pranavi Aure C 03/26/15, 10:16 PM  10:23 PM  Etta Grandchild, PT, DPT [place] License # [id number]

## 2015-03-05 NOTE — Plan of Care (Signed)
Problem: Acute Rehab PT Goals(only PT should resolve) Goal: Pt Will Ambulate Pt will ambulate independently using a step-through pattern and equal step length for a distances greater than 266f with SaO2 >89% on room air to demonstrate the ability to perform safe household distance ambulation at discharge.    Goal: Pt Will Go Up/Down Stairs Pt will ascend/descend 6 stairs with LRAD at Supervision on room air with SaO2 > 88% to demonstrate safe entry/exit of home.

## 2015-03-05 NOTE — Progress Notes (Signed)
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 Her oncologist is at [facility]. She underwent thyroid surgery at [facility]. Presented with shortness of breath, fever, and was noted to have pneumonia. Patient was hospitalized for further management.  Past medical history:  Past Medical History  Diagnosis Date  . Essential hypertension   . Anxiety   . Depression   . COPD (chronic obstructive pulmonary disease) ([place])   . GERD (gastroesophageal reflux disease)   . Headache(784.0)   . Arthritis   . Hx of adenomatous colonic polyps   . Emphysema   . Cataract     1 lens implaqnt right eye,intact on left eye  . Goiter   . Fibromyalgia   . Pulmonary emboli ([place]) 11/04/11    Right upper lobe and r lower lobe PE  . Lung nodule seen on imaging study 11/04/11 CT    25m LLL  . DVT (deep venous thrombosis) ([place] 12/09/2011  . Pulmonary embolism ([facility] 12/09/2011  . Hyperlipidemia   . History of radiation therapy 10/06/11    SRS 15Gy 19f brain  . UTI (lower urinary tract infection)   . Borderline diabetes mellitus   . Metastatic adenocarcinoma to brain ([facility]/12/13    Left frontoparietal region  . Thyroid cancer ([facility]2015  . S/P radiation therapy 05/31/14 [facility]  Brain    Consultants: None  Procedures: None  Antibiotics: Patient was started on vancomycin and aztreonam 11/27 Patient was switched over to intravenous Levaquin 11/30  Subjective: Patient;s husband is at the bedside. She states that she is feeling better. However, she continues to have a cough. Breathing is much improved. No nausea, vomiting. Husband feels that she is not significantly improved.  Objective: Vital Signs  Filed Vitals:   03/04/15 2233 03/04/15 2320  03/05/15 0702 03/05/15 0832  BP: 152/90  129/98   Pulse: 69  82 65  Temp: 97.7 F (36.5 C)  97.5 F (36.4 C)   TempSrc: Axillary  Axillary   Resp: _0 Height:      Weight:      SpO2: 98% 98% 100% 99%    Intake/Output Summary (Last 24 hours) at 03/05/15 [id number] data filed at 03/05/15 [id number] per 24 hour  Intake 2237.5 ml  Output   1300 ml  Net  937.5 ml   Filed Weights   03/03/15 0520  Weight: 66.225 kg (146 lb)    General appearance: alert, cooperative, appears stated age and no distress Resp: Slightly decreased air entry at the bases but no wheezing or crackles heard. No rhonchi. Entry is quite good the rest of the lung fields. Cardio: regular rate and rhythm, S1, S2 normal, no murmur, click, rub or gallop GI: soft, non-tender; bowel sounds normal; no masses,  no organomegaly Extremities: extremities normal, atraumatic, no cyanosis or edema Neurologic: Alert. Cranial nerves II-12 intact. No obvious focal neurological deficits.  Lab Results:  Basic Metabolic Panel:  Recent Labs Lab 03/02/15 0815 03/02/15 2359 03/03/15 0622  NA 132* 132* 135  K 4.6 3.1* 3.5  CL 96* 98* 103  CO2 28 21* 21*  GLUCOSE 132* 85 141*  BUN _1 CREATININE 0.53 0.42* 0.44  CALCIUM 8.3* 8.1* 7.6*   Liver Function Tests:  Recent Labs  Lab 03/02/15 2359  AST 23  ALT 29  ALKPHOS 51  BILITOT 1.3*  PROT 6.1*  ALBUMIN 2.9*   CBC:  Recent Labs Lab 03/02/15 0815 03/02/15 2359 03/03/15 0622 03/04/15 0938 03/05/15 0658  WBC 2.5* 1.7* 2.6* 1.9* 1.9*  NEUTROABS 2.2 1.4*  --   --   --   HGB 14.9 14.9 13.8 13.8 13.6  HCT 44.3 44.6 41.8 41.7 41.6  MCV 87.5 86.8 88.0 88.0 88.1  PLT 69* 59* 60* 62* 72*   Cardiac Enzymes:  Recent Labs Lab 03/02/15 2359  TROPONINI 0.05*     Recent Results (from the past 240 hour(s))  Urine culture     Status: None   Collection Time: 03/02/15 10:55 PM  Result Value Ref Range Status   Specimen Description URINE, CLEAN CATCH   Final   Special Requests NONE  Final   Culture   Final    MULTIPLE SPECIES PRESENT, SUGGEST RECOLLECTION Performed at [facility]    Report Status 03/04/2015 FINAL  Final  Culture, blood (routine x 2)     Status: None (Preliminary result)   Collection Time: 03/02/15 11:59 PM  Result Value Ref Range Status   Specimen Description BLOOD RIGHT HAND  Final   Special Requests BOTTLES DRAWN AEROBIC AND ANAEROBIC 6CC  Final   Culture NO GROWTH 1 DAY  Final   Report Status PENDING  Incomplete  Culture, blood (routine x 2)     Status: None (Preliminary result)   Collection Time: 03/03/15 12:09 AM  Result Value Ref Range Status   Specimen Description BLOOD LEFT HAND  Final   Special Requests BOTTLES DRAWN AEROBIC AND ANAEROBIC 6CC  Final   Culture NO GROWTH 1 DAY  Final   Report Status PENDING  Incomplete      Studies/Results: No results found.  Medications:  Scheduled: . amitriptyline  25 mg Oral QHS  . dexamethasone  2 mg Intravenous Q12H  . fluconazole (DIFLUCAN) IV  100 mg Intravenous Q24H  . levofloxacin (LEVAQUIN) IV  750 mg Intravenous Q24H  . levothyroxine  137 mcg Oral QAC breakfast  . magic mouthwash  10 mL Oral QID  . magnesium oxide  400 mg Oral Daily  . NONFORMULARY OR COMPOUNDED ITEM 60 mg  60 mg Oral BID  . pantoprazole  80 mg Oral Daily  . polyethylene glycol  17 g Oral QODAY  . sodium chloride  3 mL Intravenous Q12H  . SONAFINE  1 application Topical Daily   Continuous: . sodium chloride 50 mL/hr at 03/05/15 0848   SWH:[id number] **OR** acetaminophen, ALPRAZolam, alum & mag hydroxide-simeth, HYDROmorphone (DILAUDID) injection, lidocaine, magic mouthwash, ondansetron **OR** ondansetron (ZOFRAN) IV  Assessment/Plan:  Active Problems:   Metastatic adenocarcinoma to brain, thyroid cancer   Sepsis ([place])   HCAP (healthcare-associated pneumonia)   Mucositis due to radiation therapy   COPD (chronic obstructive pulmonary disease) (HCC)   Pneumonia    Hyponatremia   Hypokalemia   Leukopenia   Thrombocytopenia ([place])   Hypothyroid    Healthcare associated pneumonia Patient met sepsis criteria at the time of admission with fever, tachycardia and tachypnea. She was started on broad-spectrum antibiotics. Cultures are all negative. Sepsis physiology has resolved. We will change her over to Levaquin today. She continues to be on high flow nasal cannula. This will be titrated down.  Metastatic thyroid cancer with metastases to the brain Continue oral chemotherapy. Continue Decadron. She has recently completed radiation treatment. Followed by oncology at [facility].  Mucositis Secondary to radiation plus possible oropharyngeal candida. Continue lidocaine, magic mouthwash. Also on Diflucan.  History of COPD Stable.  Pancytopenia Cell counts are stable today. Continue to monitor for now.  History of hypothyroidism Continue Synthroid.  DVT Prophylaxis: SCDs    Code Status: Full code  Family Communication: Discussed with the patient and her husband  Disposition Plan: Continue current treatment. Await clinical improvement. Change to intravenous Levaquin today. Start mobilizing.     LOS: 2 days   [facility] Pager [phone] 03/05/2015, 8:38 AM  If 7PM-7AM, please contact night-coverage at [email or web address], password [email or web address]

## 2015-03-06 DIAGNOSIS — R1310 Dysphagia, unspecified: Secondary | ICD-10-CM

## 2015-03-06 DIAGNOSIS — K1230 Oral mucositis (ulcerative), unspecified: Secondary | ICD-10-CM

## 2015-03-06 DIAGNOSIS — A419 Sepsis, unspecified organism: Principal | ICD-10-CM

## 2015-03-06 DIAGNOSIS — D72819 Decreased white blood cell count, unspecified: Secondary | ICD-10-CM

## 2015-03-06 DIAGNOSIS — J189 Pneumonia, unspecified organism: Secondary | ICD-10-CM

## 2015-03-06 DIAGNOSIS — C7931 Secondary malignant neoplasm of brain: Secondary | ICD-10-CM

## 2015-03-06 DIAGNOSIS — E860 Dehydration: Secondary | ICD-10-CM

## 2015-03-06 DIAGNOSIS — D696 Thrombocytopenia, unspecified: Secondary | ICD-10-CM

## 2015-03-06 DIAGNOSIS — K1233 Oral mucositis (ulcerative) due to radiation: Secondary | ICD-10-CM

## 2015-03-06 LAB — BASIC METABOLIC PANEL
ANION GAP: 9 (ref 5–15)
BUN: 14 mg/dL (ref 6–20)
CALCIUM: 8.3 mg/dL — AB (ref 8.9–10.3)
CHLORIDE: 102 mmol/L (ref 101–111)
CO2: 26 mmol/L (ref 22–32)
Creatinine, Ser: 0.33 mg/dL — ABNORMAL LOW (ref 0.44–1.00)
GFR calc non Af Amer: >60 mL/min (ref >60)
Glucose, Bld: 118 mg/dL — ABNORMAL HIGH (ref 65–99)
Potassium: 3.4 mmol/L — ABNORMAL LOW (ref 3.5–5.1)
Sodium: 137 mmol/L (ref 135–145)

## 2015-03-06 LAB — CBC WITH DIFFERENTIAL/PLATELET
BASOS ABS: 0 10*3/uL (ref 0.0–0.1)
Basophils Relative: 1 %
Eosinophils Absolute: 0 10*3/uL (ref 0.0–0.7)
Eosinophils Relative: 0 %
HEMATOCRIT: 43.1 % (ref 36.0–46.0)
HEMOGLOBIN: 14.4 g/dL (ref 12.0–15.0)
LYMPHS PCT: 7 %
Lymphs Abs: 0.2 10*3/uL — ABNORMAL LOW (ref 0.7–4.0)
MCH: 29.1 pg (ref 26.0–34.0)
MCHC: 33.4 g/dL (ref 30.0–36.0)
MCV: 87.2 fL (ref 78.0–100.0)
Monocytes Absolute: 0.2 10*3/uL (ref 0.1–1.0)
Monocytes Relative: 7 %
NEUTROS ABS: 1.8 10*3/uL (ref 1.7–7.7)
Neutrophils Relative %: 85 %
Platelets: 78 10*3/uL — ABNORMAL LOW (ref 150–400)
RBC: 4.94 MIL/uL (ref 3.87–5.11)
RDW: 15.7 % — AB (ref 11.5–15.5)
Smear Review: DECREASED
WBC: 2.1 10*3/uL — AB (ref 4.0–10.5)

## 2015-03-06 MED ORDER — POTASSIUM CHLORIDE CRYS ER 20 MEQ PO TBCR
40.0000 meq | EXTENDED_RELEASE_TABLET | Freq: Once | ORAL | Status: AC
  Administered 2015-03-06: 40 meq via ORAL
  Filled 2015-03-06: qty 2

## 2015-03-06 MED ORDER — AMLODIPINE BESYLATE 5 MG PO TABS
5.0000 mg | ORAL_TABLET | Freq: Every day | ORAL | Status: DC
  Administered 2015-03-06 – 2015-03-12 (×6): 5 mg via ORAL
  Filled 2015-03-06 (×8): qty 1

## 2015-03-06 NOTE — Consult Note (Signed)
Referring Provider: No ref. provider found Primary Care Physician:  [name], MD Primary Gastroenterologist:  Dr. Havery Moros ([facility])  Date of Admission: 03/02/15 Date of Consultation: 03/06/15  Reason for Consultation:  Dysphagia, odynophagia  HPI:  60 year old female with a PMH of thyroid cancer with METS to brain s/p radiation as well as ongoing chemotherapy presented to the ER with dyspnea and fever and was admitted with dx of pneumonia/sepsis. Other pertinent PMH includes short segment non-dysplastic Barrett's Esophagus noted on EGD in 2013, GERD. She was recently seen at [facility] and was set to proceed with EGD and TCS due to abdominal pain, bloating, constipation, rectal bleeding and Barrett's, but this was not completed. Sepsis has since resolved, remains on levaquin, good O2 sats. Chemotherapy is ongoing. Labs to date show low WBC count, low platelets, stable kidney function, normal INR, Normal AST/ALT, slightly elevated Bili at 1.3 on 03/02/15.  Today she states she was told she would have pain and complications with the radiation but had no idea it would be this bad. Lidocaine gel somewhat effective. Has mouth sores and subsequent pain makes it difficult to eat. Hasn't eaten much in the past couple days. Solid food dysphagia with regurgitation at times. Also with difficulty swallowing liquids when she will be come "choked" and have difficulty passing it. Also with painful swallowing. Denies recent BM "I haven't eaten much lately." Denies N/V, hematochezia and melena. Denies hematemesis. No further GI complaints. Dyspnea improved since admission.  Past Medical History  Diagnosis Date  . Essential hypertension   . Anxiety   . Depression   . COPD (chronic obstructive pulmonary disease) ([place])   . GERD (gastroesophageal reflux disease)   . Headache(784.0)   . Arthritis   . Hx of adenomatous colonic polyps   . Emphysema   . Cataract     1 lens implaqnt right eye,intact on left eye   . Goiter   . Fibromyalgia   . Pulmonary emboli ([place]) 11/04/11    Right upper lobe and r lower lobe PE  . Lung nodule seen on imaging study 11/04/11 CT    41m LLL  . DVT (deep venous thrombosis) ([place] 12/09/2011  . Pulmonary embolism ([place] 12/09/2011  . Hyperlipidemia   . History of radiation therapy 10/06/11    SRS 15Gy 184f brain  . UTI (lower urinary tract infection)   . Borderline diabetes mellitus   . Metastatic adenocarcinoma to brain ([place]CL[place]6/12/13    Left frontoparietal region  . Thyroid cancer (HC[place]2015  . S/P radiation therapy 05/31/14 SRS    Brain    Past Surgical History  Procedure Laterality Date  . Abdominal hysterectomy  1996  . Cholecystectomy  2000  . Shoulder surgery Right 1998  . Eye surgery  2001  . Foot surgery Left 2005  . Craniotomy  09/15/2011    Procedure: CRANIOTOMY TUMOR EXCISION;  Surgeon: RoHosie SpangleMD;  [facility];  Service: Neurosurgery;  Laterality: N/A;  Craniotomy resection of tumor with stealth  . Cataract extraction  2011    With lens implant  . [facility]  [facility]29-2013    [facility]    . Video bronchoscopy with endobronchial ultrasound N/A 04/11/2014    Procedure: VIDEO BRONCHOSCOPY WITH ENDOBRONCHIAL ULTRASOUND;  Surgeon: StMelrose NakayamaMD;  [place]: [facility] Service: Thoracic;  Laterality: N/A;    Prior to Admission medications   Medication Sig Start Date End Date Taking?  Authorizing Provider  ALPRAZolam Duanne Moron) 0.5 MG tablet Take 0.5 mg by mouth 3 (three) times daily as needed for anxiety.  01/04/12  Yes Historical Provider, MD  amitriptyline (ELAVIL) 25 MG tablet Take 25 mg by mouth at bedtime.   Yes Historical Provider, MD  dexamethasone (DECADRON) 4 MG tablet Take 1 tablet (4 mg total) by mouth 2 (two) times daily. Patient taking differently: Take 4 mg by mouth daily.  02/25/15  Yes Kyung Rudd, MD  Lenvatinib 14 MG Daily Dose (LENVIMA 14 MG DAILY DOSE) 10 & 4 MG  CPPK Take 14 mg by mouth daily. '10mg'$  cap and '4mg'$  cap per dose 02/20/15  Yes Historical Provider, MD  levothyroxine (SYNTHROID, LEVOTHROID) 137 MCG tablet Take 137 mcg by mouth daily before breakfast.   Yes Historical Provider, MD  magic mouthwash w/lidocaine SOLN Take 5 mLs by mouth 4 (four) times daily as needed for mouth pain. 02/25/15  Yes Kyung Rudd, MD  Magnesium 250 MG TABS Take 1 tablet by mouth daily.   Yes Historical Provider, MD  morphine (MS CONTIN) 60 MG 12 hr tablet Take 60 mg by mouth every 12 (twelve) hours.   Yes Historical Provider, MD  omeprazole (PRILOSEC) 40 MG capsule Take 1 capsule (40 mg total) by mouth daily. 12/06/14  Yes Manus Gunning, MD  ondansetron (ZOFRAN ODT) 8 MG disintegrating tablet Take 1 tablet (8 mg total) by mouth every 8 (eight) hours as needed for nausea or vomiting. 04/05/14  Yes Pattricia Boss, MD  ondansetron (ZOFRAN) 8 MG tablet Take 8 mg by mouth every 8 (eight) hours as needed for nausea or vomiting.   Yes Historical Provider, MD  Oxycodone HCl 20 MG TABS Take 1 tablet by mouth every 4 (four) hours as needed (pain).   Yes Historical Provider, MD  polyethylene glycol (MIRALAX / GLYCOLAX) packet Take 17 g by mouth every other day. For constipation   Yes Historical Provider, MD  Simethicone (GAS-X EXTRA STRENGTH PO) Take 1 tablet by mouth daily as needed (gas relief).   Yes Historical Provider, MD  Wound Dressings (SONAFINE) Apply 1 application topically daily.   Yes Historical Provider, MD  magic mouthwash SOLN Take 10 mLs by mouth 4 (four) times daily. Swish medication in mouth and spit it out 03/02/15   Daleen Bo, MD    Current Facility-Administered Medications  Medication Dose Route Frequency Provider Last Rate Last Dose  . 0.9 %  sodium chloride infusion   Intravenous Continuous Bonnielee Haff, MD 50 mL/hr at 03/05/15 0848    . acetaminophen (TYLENOL) tablet 650 mg  650 mg Oral Q6H PRN Theressa Millard, MD       Or  . acetaminophen  (TYLENOL) suppository 650 mg  650 mg Rectal Q6H PRN Theressa Millard, MD      . ALPRAZolam Duanne Moron) tablet 0.5 mg  0.5 mg Oral TID PRN Theressa Millard, MD   0.5 mg at 03/03/15 0941  . alum & mag hydroxide-simeth (MAALOX/MYLANTA) 200-200-20 MG/5ML suspension 30 mL  30 mL Oral Q6H PRN Theressa Millard, MD      . amitriptyline (ELAVIL) tablet 25 mg  25 mg Oral QHS Theressa Millard, MD   25 mg at 03/05/15 2150  . amLODipine (NORVASC) tablet 5 mg  5 mg Oral Daily Bonnielee Haff, MD      . dexamethasone (DECADRON) injection 2 mg  2 mg Intravenous Q12H Theressa Millard, MD   2 mg at 03/06/15 0857  . fluconazole (DIFLUCAN) IVPB  100 mg  100 mg Intravenous Q24H Nishant Dhungel, MD   100 mg at 03/06/15 6440  . home med stored in pyxis 1 each  1 each Oral BID Bonnielee Haff, MD   1 each at 03/06/15 [phone]  . HYDROmorphone (DILAUDID) injection 0.5-1 mg  0.5-1 mg Intravenous Q3H PRN Theressa Millard, MD   1 mg at 03/06/15 0649  . levofloxacin (LEVAQUIN) IVPB 750 mg  750 mg Intravenous Q24H Bonnielee Haff, MD   750 mg at 03/06/15 0814  . levothyroxine (SYNTHROID, LEVOTHROID) tablet 137 mcg  137 mcg Oral QAC breakfast Annita Brod, MD   137 mcg at 03/06/15 0800  . lidocaine (XYLOCAINE) 2 % viscous mouth solution 15 mL  15 mL Mouth/Throat Q3H PRN Theressa Millard, MD   15 mL at 03/06/15 0558  . magic mouthwash  10 mL Oral QID Theressa Millard, MD   10 mL at 03/06/15 0759  . magic mouthwash  15 mL Oral QID PRN Nishant Dhungel, MD      . magnesium oxide (MAG-OX) tablet 400 mg  400 mg Oral Daily Nishant Dhungel, MD   400 mg at 03/06/15 0856  . ondansetron (ZOFRAN) tablet 4 mg  4 mg Oral Q6H PRN Theressa Millard, MD       Or  . ondansetron (ZOFRAN) injection 4 mg  4 mg Intravenous Q6H PRN Theressa Millard, MD      . pantoprazole (PROTONIX) EC tablet 80 mg  80 mg Oral Daily Annita Brod, MD   80 mg at 03/06/15 0856  . polyethylene glycol (MIRALAX / GLYCOLAX) packet 17 g  17 g Oral QODAY  Theressa Millard, MD   17 g at 03/05/15 1000  . sodium chloride 0.9 % injection 3 mL  3 mL Intravenous Q12H Theressa Millard, MD   3 mL at 03/06/15 0857  . SONAFINE emulsion 1 application  1 application Topical Daily Theressa Millard, MD   1 application at 25/95/63 8756    Allergies as of 03/02/2015 - Review Complete 03/02/2015  Allergen Reaction Noted  . Penicillins Swelling 09/09/2011  . Other Swelling 09/26/2014  . Codeine Itching 10/04/2011  . Tape Rash 01/31/2014    Family History  Problem Relation Age of Onset  . Asthma Mother   . Kidney failure Father   . Diabetes Sister   . Heart attack Sister   . Colon cancer Brother   . COPD Sister   . Aneurysm Paternal Grandmother     Brain  . Parkinsonism Maternal Uncle   . COPD Brother     Social History   Social History  . Marital Status: Married    Spouse Name: N/A  . Number of Children: 4  . Years of Education: N/A   Occupational History  . disabled     prev Time [employer]   Social History Main Topics  . Smoking status: Former Smoker -- 1.00 packs/day for 40 years    Types: Cigarettes    Quit date: 04/06/2011  . Smokeless tobacco: Never Used  . Alcohol Use: No  . Drug Use: No  . Sexual Activity: Yes   Other Topics Concern  . Not on file   Social History Narrative   Married, unemployed    Review of Systems: Gen: Denies fever, chills CV: Denies chest pain, heart palpitations, syncope, edema  Resp: Admits shortness of breath when oxygen is removed. GI: See HPI  Derm: Denies rash, itching, dry skin Psych: Denies depression, anxiety,confusion,  or memory loss  Physical Exam: Vital signs in last 24 hours: Temp:  [97.9 F (36.6 C)-98.1 F (36.7 C)] 98 F (36.7 C) (12/01 1135) Pulse Rate:  [75-110] 79 (12/01 1135) Resp:  [17-18] 18 (12/01 1135) BP: (135-145)/(92-98) 145/98 mmHg (12/01 1135) SpO2:  [92 %-98 %] 96 % (12/01 1135) Last BM Date: 03/02/15 General:   Alert,  Appears weak, thinning hair/hair  loss. Pleasant and cooperative in NAD Head:  Normocephalic and atraumatic. Eyes:  Sclera clear, no icterus. Conjunctiva pink. Ears:  Normal auditory acuity. Mouth:  Red, painful mouth and tongue lesions noted. Heart:  Regular rate and rhythm; no murmurs, clicks, rubs,  or gallops. Abdomen:  Soft, nontender and nondistended. Normal bowel sounds, without guarding, and without rebound.   Rectal:  Deferred.   Extremities:  Without clubbing or edema. Neurologic:  Alert and  oriented x4;  grossly normal neurologically. Psych:  Normal mood and affect.  Intake/Output from previous day: 11/30 0701 - 12/01 0700 In: 1083 [P.O.:1080; I.V.:3] Out: 300 [Urine:300] Intake/Output this shift:    Lab Results:  Recent Labs  03/04/15 0938 03/05/15 0658 03/06/15 1059  WBC 1.9* 1.9* 2.1*  HGB 13.8 13.6 14.4  HCT 41.7 41.6 43.1  PLT 62* 72* 78*   BMET  Recent Labs  03/06/15 1059  NA 137  K 3.4*  CL 102  CO2 26  GLUCOSE 118*  BUN 14  CREATININE 0.33*  CALCIUM 8.3*   LFT No results for input(s): PROT, ALBUMIN, AST, ALT, ALKPHOS, BILITOT, BILIDIR, IBILI in the last 72 hours. PT/INR No results for input(s): LABPROT, INR in the last 72 hours. Hepatitis Panel No results for input(s): HEPBSAG, HCVAB, HEPAIGM, HEPBIGM in the last 72 hours. C-Diff No results for input(s): CDIFFTOX in the last 72 hours.  Studies/Results: No results found.  Impression: 59 year old patient who completed head and neck radiation for thyroid cancer METS to brain, continues on chemotherapy for the same. Admitted with sepsis and pneumonia. Has had new onset of dysphagia odynophagia for about a week right arount the time she completed radiation. Dysphagia with solid foods and liquids, occasional regurgitation. Chemotherapy immunocompromised with low WBCs, painful mouth sores, minimal po intake in the past week from pain and dysphagia. Multiple differentials, most common include radiation esophagitis and tissue  injury, candida esophagitis, cannot rule out stricture, web, or ring. Also with a history of non-dysplastic Barrett's esophagus which is concerning given current presentation.  Plan: 1. Continue supportive measures 2. Upper endoscopy, likely tomorrow to further evaluate with further recommendations to follow 3. Will likely need propofol to ensure adequate sedation due to chronic pain medications for cancer pain 4. Continue Xylocaine mouth solution for mouth sores and pain mediation for general pain    Walden Field, AGNP-C Adult & Gerontological Nurse Practitioner [facility]    LOS: 3 days     03/06/2015, 1:08 PM

## 2015-03-06 NOTE — Progress Notes (Signed)
Initial Nutrition Assessment  DOCUMENTATION CODES:   Severe malnutrition in context of acute illness/injury  INTERVENTION:  Boost Breeze po TID, each supplement provides 250 kcal and 9 grams of protein    NUTRITION DIAGNOSIS:   Inadequate oral intake related to dysphagia as evidenced by meal completion < 50%, per patient/family report.   GOAL:   Patient will meet greater than or equal to 90% of their needs  MONITOR:   Diet advancement, PO intake, Supplement acceptance, Weight trends, Labs  REASON FOR ASSESSMENT:   Malnutrition Screening Tool    ASSESSMENT:  Pt has metastatic thyroid cancer with metastasis to brain. She completed radiation the day before Thanksgiving and says she has not been able to eat. Husband is present and provided most of hx due to pt painful mouth. She has only taken bites of yogurt and is coughing with sips of fluids. She is unable to eat pudding "because it's too thick" and salty foods like mash potatoes hurt her mouth.  She is scheduled for EGD tomorrow. Will follow up results. She may need to bridge her nutrition with enteral feeding if unable to resume oral intake in the next few days.   Pt meets criteria for severe MALNUTRITION in the context of acute illness as evidenced by energy intake </= 50% for >/= 5 days and acute weight loss >5% in 10 days.   Diet Order:  Diet clear liquid Room service appropriate?: Yes; Fluid consistency:: Thin Diet NPO time specified  Skin:   no concerns noted  Last BM:   11/27  Height:   Ht Readings from Last 1 Encounters:  03/03/15 '5\' 3"'$  (1.6 m)    Weight:   Wt Readings from Last 1 Encounters:  03/03/15 146 lb (66.225 kg)    Ideal Body Weight:  52.2 kg  BMI:  Body mass index is 25.87 kg/(m^2).  Estimated Nutritional Needs:   Kcal:  1650-1980  Protein:  79-86 gr  Fluid:  2.0 liters daily  EDUCATION NEEDS:   Education needs no appropriate at this time  Colman Cater MS,RD,CSG,LDN Office:  [phone] Pager: [phone]

## 2015-03-06 NOTE — Progress Notes (Signed)
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 Her oncologist is at [facility]. She underwent thyroid surgery at [facility]. Presented with shortness of breath, fever, and was noted to have pneumonia. Patient was hospitalized for further management.  Past medical history:  Past Medical History  Diagnosis Date  . Essential hypertension   . Anxiety   . Depression   . COPD (chronic obstructive pulmonary disease) ([place])   . GERD (gastroesophageal reflux disease)   . Headache(784.0)   . Arthritis   . Hx of adenomatous colonic polyps   . Emphysema   . Cataract     1 lens implaqnt right eye,intact on left eye  . Goiter   . Fibromyalgia   . Pulmonary emboli ([place]) 11/04/11    Right upper lobe and r lower lobe PE  . Lung nodule seen on imaging study 11/04/11 CT    46mm LLL  . DVT (deep venous thrombosis) ([place]) 12/09/2011  . Pulmonary embolism ([place]) 12/09/2011  . Hyperlipidemia   . History of radiation therapy 10/06/11    SRS 15Gy 21fx, brain  . UTI (lower urinary tract infection)   . Borderline diabetes mellitus   . Metastatic adenocarcinoma to brain ([place]) 09/15/11    Left frontoparietal region  . Thyroid cancer ([place]) 2015  . S/P radiation therapy 05/31/14 [place]    Brain    Consultants: Gastroenterology  Procedures: None  Antibiotics: Patient was started on vancomycin and aztreonam 11/27 Patient was switched over to intravenous Levaquin 11/30  Subjective: [name] is at the bedside. Patient feels well in terms of her breathing. However, continues to have a lot of pain in her mouth as well as some difficulty swallowing and painful swallowing. Also mentions that whenever she tries to eat or drink something it tends to come  back up and she has a coughing spell. Her husband is concerned about the fact that she has not been able to eat or drink anything at all last few days.   Objective: Vital Signs  Filed Vitals:   03/05/15 1439 03/05/15 1900 03/05/15 2217 03/06/15 0543  BP: 135/92  137/96 135/93  Pulse: 110  76 75  Temp: 97.9 F (36.6 C)  98 F (36.7 C) 98.1 F (36.7 C)  TempSrc: Oral  Oral Oral  Resp: $Remo'17  18 18  'szbTT$ Height:      Weight:      SpO2: 94% 98% 92% 98%    Intake/Output Summary (Last 24 hours) at 03/06/15 1047 Last data filed at 03/06/15 0600  Gross per 24 hour  Intake    840 ml  Output    300 ml  Net    540 ml   Filed Weights   03/03/15 0520  Weight: 66.225 kg (146 lb)    General appearance: alert, cooperative, appears stated age and no distress Denuded mucosa in the oral cavity. Resp: Improving air entry bilaterally. No wheezing, rales or rhonchi.  Cardio: regular rate and rhythm, S1, S2 normal, no murmur, click, rub or gallop GI: soft, non-tender; bowel sounds normal; no masses,  no organomegaly Extremities: extremities normal, atraumatic, no cyanosis or edema Neurologic: Alert. Cranial nerves II-12 intact. No obvious focal neurological deficits.  Lab Results:  Basic Metabolic Panel:  Recent Labs Lab 03/02/15 0815 03/02/15 2359 03/03/15 0622  NA  132* 132* 135  K 4.6 3.1* 3.5  CL 96* 98* 103  CO2 28 21* 21*  GLUCOSE 132* 85 141*  BUN $Re'13 12 11  'wqg$ CREATININE 0.53 0.42* 0.44  CALCIUM 8.3* 8.1* 7.6*   Liver Function Tests:  Recent Labs Lab 03/02/15 2359  AST 23  ALT 29  ALKPHOS 51  BILITOT 1.3*  PROT 6.1*  ALBUMIN 2.9*   CBC:  Recent Labs Lab 03/02/15 0815 03/02/15 2359 03/03/15 0622 03/04/15 0938 03/05/15 0658  WBC 2.5* 1.7* 2.6* 1.9* 1.9*  NEUTROABS 2.2 1.4*  --   --  1.5*  HGB 14.9 14.9 13.8 13.8 13.6  HCT 44.3 44.6 41.8 41.7 41.6  MCV 87.5 86.8 88.0 88.0 88.1  PLT 69* 59* 60* 62* 72*   Cardiac Enzymes:  Recent Labs Lab 03/02/15 2359    TROPONINI 0.05*     Recent Results (from the past 240 hour(s))  Urine culture     Status: None   Collection Time: 03/02/15 10:55 PM  Result Value Ref Range Status   Specimen Description URINE, CLEAN CATCH  Final   Special Requests NONE  Final   Culture   Final    MULTIPLE SPECIES PRESENT, SUGGEST RECOLLECTION Performed at [facility]    Report Status 03/04/2015 FINAL  Final  Culture, blood (routine x 2)     Status: None (Preliminary result)   Collection Time: 03/02/15 11:59 PM  Result Value Ref Range Status   Specimen Description BLOOD RIGHT HAND  Final   Special Requests BOTTLES DRAWN AEROBIC AND ANAEROBIC 6CC  Final   Culture NO GROWTH 2 DAYS  Final   Report Status PENDING  Incomplete  Culture, blood (routine x 2)     Status: None (Preliminary result)   Collection Time: 03/03/15 12:09 AM  Result Value Ref Range Status   Specimen Description BLOOD LEFT HAND  Final   Special Requests BOTTLES DRAWN AEROBIC AND ANAEROBIC 6CC  Final   Culture NO GROWTH 2 DAYS  Final   Report Status PENDING  Incomplete  Culture, expectorated sputum-assessment     Status: None   Collection Time: 03/04/15 11:05 PM  Result Value Ref Range Status   Specimen Description SPUTUM EXPECTORATED  Final   Special Requests IMMUNE:COMPROMISED  Final   Sputum evaluation   Final    THIS SPECIMEN IS ACCEPTABLE. RESPIRATORY CULTURE REPORT TO FOLLOW. Performed at [facility]    Report Status 03/05/2015 FINAL  Final      Studies/Results: No results found.  Medications:  Scheduled: . amitriptyline  25 mg Oral QHS  . dexamethasone  2 mg Intravenous Q12H  . fluconazole (DIFLUCAN) IV  100 mg Intravenous Q24H  . home med stored in pyxis  1 each Oral BID  . levofloxacin (LEVAQUIN) IV  750 mg Intravenous Q24H  . levothyroxine  137 mcg Oral QAC breakfast  . magic mouthwash  10 mL Oral QID  . magnesium oxide  400 mg Oral Daily  . pantoprazole  80 mg Oral Daily  . polyethylene glycol  17 g  Oral QODAY  . sodium chloride  3 mL Intravenous Q12H  . SONAFINE  1 application Topical Daily   Continuous: . sodium chloride 50 mL/hr at 03/05/15 0848   RUE:[id number] **OR** acetaminophen, ALPRAZolam, alum & mag hydroxide-simeth, HYDROmorphone (DILAUDID) injection, lidocaine, magic mouthwash, ondansetron **OR** ondansetron (ZOFRAN) IV  Assessment/Plan:  Active Problems:   Metastatic adenocarcinoma to brain, thyroid cancer   Sepsis ([place])   HCAP (healthcare-associated pneumonia)   Mucositis  due to radiation therapy   COPD (chronic obstructive pulmonary disease) ([facility])   Pneumonia   Hyponatremia   Hypokalemia   Leukopenia   Thrombocytopenia ([place])   Hypothyroid    Healthcare associated pneumonia Patient met sepsis criteria at the time of admission with fever, tachycardia and tachypnea. She was started on broad-spectrum antibiotics. Cultures are all negative. Sepsis physiology has resolved. Patient was changed over to [place]. Oxygen was also titrated down. Saturating well on just 2 L by nasal cannula. Leave her on intravenous antibiotics till she is able to take orally.   Metastatic thyroid cancer with metastases to the brain Continue oral chemotherapy. Continue Decadron. She has recently completed radiation treatment. Followed by oncology at [facility].  Mucositis Secondary to radiation plus possible oropharyngeal candida. Continue lidocaine, magic mouthwash. Also on Diflucan.  Odynophagia/Dysphagia This is quite concerning in view of recent radiation treatment. Consult gastroenterology as patient may need endoscopy to determine etiology of her symptoms. Consult dietitian as well to assess nutritional status. She is likely malnourished. She reports a weight loss of about 8 pounds in the last few days.  History of COPD Stable.  Pancytopenia Cell counts have been stable. Labs are pending today. Continue to monitor for now.  History of hypothyroidism Continue  Synthroid.  DVT Prophylaxis: SCDs    Code Status: Full code  Family Communication: Discussed with the patient and her husband  Disposition Plan: Continue current treatment. Await clinical improvement. Await GI input.      LOS: 3 days   [facility] Pager [phone] 03/06/2015, 10:47 AM  If 7PM-7AM, please contact night-coverage at [email or web address], password [email or web address]

## 2015-03-07 ENCOUNTER — Inpatient Hospital Stay (HOSPITAL_COMMUNITY): Payer: Medicare Other | Admitting: Anesthesiology

## 2015-03-07 ENCOUNTER — Encounter (HOSPITAL_COMMUNITY)
Admission: EM | Disposition: A | Discharge status: Home / Self Care | Payer: Self-pay | Source: Home / Self Care | Attending: Internal Medicine

## 2015-03-07 ENCOUNTER — Encounter (HOSPITAL_COMMUNITY): Payer: Self-pay | Admitting: *Deleted

## 2015-03-07 DIAGNOSIS — R1310 Dysphagia, unspecified: Secondary | ICD-10-CM | POA: Insufficient documentation

## 2015-03-07 DIAGNOSIS — K219 Gastro-esophageal reflux disease without esophagitis: Secondary | ICD-10-CM | POA: Insufficient documentation

## 2015-03-07 DIAGNOSIS — K2289 Other specified disease of esophagus: Secondary | ICD-10-CM | POA: Insufficient documentation

## 2015-03-07 DIAGNOSIS — C799 Secondary malignant neoplasm of unspecified site: Secondary | ICD-10-CM | POA: Insufficient documentation

## 2015-03-07 DIAGNOSIS — K22719 Barrett's esophagus with dysplasia, unspecified: Secondary | ICD-10-CM | POA: Insufficient documentation

## 2015-03-07 DIAGNOSIS — K229 Disease of esophagus, unspecified: Secondary | ICD-10-CM

## 2015-03-07 DIAGNOSIS — E43 Unspecified severe protein-calorie malnutrition: Secondary | ICD-10-CM | POA: Insufficient documentation

## 2015-03-07 HISTORY — PX: BIOPSY: SHX5522

## 2015-03-07 HISTORY — PX: ESOPHAGOGASTRODUODENOSCOPY (EGD) WITH PROPOFOL: SHX5813

## 2015-03-07 LAB — KOH PREP

## 2015-03-07 LAB — CBC
HEMATOCRIT: 45.1 % (ref 36.0–46.0)
HEMOGLOBIN: 15.2 g/dL — AB (ref 12.0–15.0)
MCH: 29.2 pg (ref 26.0–34.0)
MCHC: 33.7 g/dL (ref 30.0–36.0)
MCV: 86.7 fL (ref 78.0–100.0)
Platelets: 86 10*3/uL — ABNORMAL LOW (ref 150–400)
RBC: 5.2 MIL/uL — AB (ref 3.87–5.11)
RDW: 15.6 % — ABNORMAL HIGH (ref 11.5–15.5)
WBC: 2.8 10*3/uL — ABNORMAL LOW (ref 4.0–10.5)

## 2015-03-07 LAB — SURGICAL PCR SCREEN
MRSA, PCR: NEGATIVE
STAPHYLOCOCCUS AUREUS: NEGATIVE

## 2015-03-07 LAB — BASIC METABOLIC PANEL
Anion gap: 10 (ref 5–15)
BUN: 16 mg/dL (ref 6–20)
CALCIUM: 8.4 mg/dL — AB (ref 8.9–10.3)
CO2: 23 mmol/L (ref 22–32)
Chloride: 103 mmol/L (ref 101–111)
Creatinine, Ser: 0.34 mg/dL — ABNORMAL LOW (ref 0.44–1.00)
GFR calc Af Amer: >60 mL/min (ref >60)
GLUCOSE: 97 mg/dL (ref 65–99)
POTASSIUM: 3.3 mmol/L — AB (ref 3.5–5.1)
SODIUM: 136 mmol/L (ref 135–145)

## 2015-03-07 SURGERY — ESOPHAGOGASTRODUODENOSCOPY (EGD) WITH PROPOFOL
Anesthesia: Monitor Anesthesia Care

## 2015-03-07 MED ORDER — LIDOCAINE HCL (PF) 1 % IJ SOLN
INTRAMUSCULAR | Status: AC
  Filled 2015-03-07: qty 5

## 2015-03-07 MED ORDER — PROPOFOL 10 MG/ML IV BOLUS
INTRAVENOUS | Status: AC
  Filled 2015-03-07: qty 20

## 2015-03-07 MED ORDER — MAGIC MOUTHWASH W/LIDOCAINE
15.0000 mL | Freq: Four times a day (QID) | ORAL | Status: DC
  Filled 2015-03-07 (×13): qty 15

## 2015-03-07 MED ORDER — SCOPOLAMINE 1 MG/3DAYS TD PT72
1.0000 | MEDICATED_PATCH | Freq: Once | TRANSDERMAL | Status: DC
  Administered 2015-03-07: 1.5 mg via TRANSDERMAL

## 2015-03-07 MED ORDER — MIDAZOLAM HCL 2 MG/2ML IJ SOLN
1.0000 mg | INTRAMUSCULAR | Status: DC | PRN
  Administered 2015-03-07: 1 mg via INTRAVENOUS

## 2015-03-07 MED ORDER — MAGIC MOUTHWASH W/LIDOCAINE
15.0000 mL | Freq: Four times a day (QID) | ORAL | Status: DC
  Administered 2015-03-07 – 2015-03-10 (×9): 15 mL via ORAL
  Filled 2015-03-07 (×26): qty 15

## 2015-03-07 MED ORDER — FENTANYL CITRATE (PF) 100 MCG/2ML IJ SOLN: 25.0000 ug | INTRAMUSCULAR | Status: DC | PRN

## 2015-03-07 MED ORDER — MIDAZOLAM HCL 5 MG/5ML IJ SOLN
INTRAMUSCULAR | Status: DC | PRN
  Administered 2015-03-07: 1 mg via INTRAVENOUS

## 2015-03-07 MED ORDER — SUCRALFATE 1 GM/10ML PO SUSP
1.0000 g | Freq: Three times a day (TID) | ORAL | Status: DC
  Administered 2015-03-07 – 2015-03-13 (×22): 1 g via ORAL
  Filled 2015-03-07 (×23): qty 10

## 2015-03-07 MED ORDER — SODIUM CHLORIDE 0.9 % IV SOLN: INTRAVENOUS | Status: DC

## 2015-03-07 MED ORDER — POTASSIUM CHLORIDE 10 MEQ/100ML IV SOLN
10.0000 meq | INTRAVENOUS | Status: AC
  Administered 2015-03-07: 10 meq via INTRAVENOUS
  Filled 2015-03-07: qty 100

## 2015-03-07 MED ORDER — FENTANYL CITRATE (PF) 100 MCG/2ML IJ SOLN
INTRAMUSCULAR | Status: AC
  Filled 2015-03-07: qty 2

## 2015-03-07 MED ORDER — ONDANSETRON HCL 4 MG/2ML IJ SOLN: 4.0000 mg | Freq: Once | INTRAMUSCULAR | Status: DC | PRN

## 2015-03-07 MED ORDER — POTASSIUM CHLORIDE 10 MEQ/100ML IV SOLN
10.0000 meq | INTRAVENOUS | Status: AC
  Administered 2015-03-07 (×3): 10 meq via INTRAVENOUS
  Filled 2015-03-07: qty 100

## 2015-03-07 MED ORDER — LIDOCAINE VISCOUS 2 % MT SOLN
OROMUCOSAL | Status: AC
  Filled 2015-03-07: qty 15

## 2015-03-07 MED ORDER — DEXAMETHASONE SODIUM PHOSPHATE 4 MG/ML IJ SOLN
INTRAMUSCULAR | Status: AC
  Filled 2015-03-07: qty 1

## 2015-03-07 MED ORDER — MIDAZOLAM HCL 2 MG/2ML IJ SOLN
INTRAMUSCULAR | Status: AC
  Filled 2015-03-07: qty 2

## 2015-03-07 MED ORDER — PROPOFOL 10 MG/ML IV BOLUS
INTRAVENOUS | Status: DC | PRN
  Administered 2015-03-07: 10 mg via INTRAVENOUS

## 2015-03-07 MED ORDER — DEXAMETHASONE SODIUM PHOSPHATE 4 MG/ML IJ SOLN
8.0000 mg | Freq: Once | INTRAMUSCULAR | Status: AC
  Administered 2015-03-07: 8 mg via INTRAVENOUS

## 2015-03-07 MED ORDER — FENTANYL CITRATE (PF) 100 MCG/2ML IJ SOLN
INTRAMUSCULAR | Status: DC | PRN
  Administered 2015-03-07 (×2): 25 ug via INTRAVENOUS

## 2015-03-07 MED ORDER — SCOPOLAMINE 1 MG/3DAYS TD PT72
MEDICATED_PATCH | TRANSDERMAL | Status: AC
  Filled 2015-03-07: qty 1

## 2015-03-07 MED ORDER — LIDOCAINE VISCOUS 2 % MT SOLN: 15.0000 mL | Freq: Four times a day (QID) | OROMUCOSAL | Status: DC

## 2015-03-07 MED ORDER — PROPOFOL 500 MG/50ML IV EMUL
INTRAVENOUS | Status: DC | PRN
  Administered 2015-03-07: 50 ug/kg/min via INTRAVENOUS

## 2015-03-07 MED ORDER — MAGIC MOUTHWASH: 15.0000 mL | Freq: Four times a day (QID) | ORAL | Status: DC

## 2015-03-07 MED ORDER — DEXAMETHASONE SODIUM PHOSPHATE 4 MG/ML IJ SOLN
2.0000 mg | INTRAMUSCULAR | Status: DC
  Administered 2015-03-09 – 2015-03-10 (×3): 2 mg via INTRAVENOUS
  Filled 2015-03-07 (×6): qty 1

## 2015-03-07 MED ORDER — MIDAZOLAM HCL 5 MG/5ML IJ SOLN
INTRAMUSCULAR | Status: AC
  Filled 2015-03-07: qty 5

## 2015-03-07 MED ORDER — LACTATED RINGERS IV SOLN
INTRAVENOUS | Status: DC
  Administered 2015-03-07: 11:00:00 via INTRAVENOUS

## 2015-03-07 NOTE — Transfer of Care (Signed)
Immediate Anesthesia Transfer of Care Note  Patient: Haley Lowe  Procedure(s) Performed: Procedure(s): ESOPHAGOGASTRODUODENOSCOPY (EGD) WITH PROPOFOL (N/A) ESOPHAGEAL DILATION (N/A)  Patient Location: PACU  Anesthesia Type:MAC  Level of Consciousness: sedated  Airway & Oxygen Therapy: Patient Spontanous Breathing and Patient connected to face mask oxygen  Post-op Assessment: Report given to RN  Post vital signs: Reviewed and stable  Last Vitals:  Filed Vitals:   03/07/15 1048 03/07/15 1105  BP:  130/92  Pulse:    Temp: 36.7 C   Resp:  21    Complications: No apparent anesthesia complications

## 2015-03-07 NOTE — Op Note (Signed)
[facility] [address] [place], [address]   ENDOSCOPY PROCEDURE REPORT  PATIENT: Haley Lowe, Haley Lowe  MR#: [id number] BIRTHDATE: [birth year] , 37  yrs. old GENDER: female ENDOSCOPIST: R.  Garfield Cornea, MD FACP FACG REFERRED BY:  Jodelle Gross, M.D, Ph.D.  Kerin Perna, M.D. PROCEDURE DATE:  04-Apr-2015 PROCEDURE:  EGD diagnostic and w/ biopsy INDICATIONS:  Severe odynophagia/dysphagia; metastatic cancer; recent chest and neck radiation. MEDICATIONS: Deep sedation per Dr.  [facility] ASA CLASS:      Class III  CONSENT: The risks, benefits, limitations, alternatives and imponderables have been discussed.  The potential for biopsy, esophogeal dilation, etc. have also been reviewed.  Questions have been answered.  All parties agreeable.  Please see the history and physical in the medical record for more information.  DESCRIPTION OF PROCEDURE: After the risks benefits and alternatives of the procedure were thoroughly explained, informed consent was obtained.  The    endoscope was introduced through the mouth and advanced to the second portion of the duodenum , limited by Without limitations. The instrument was slowly withdrawn as the mucosa was fully examined. Estimated blood loss is zero unless otherwise noted in this procedure report.    Buccal mucosa and hard palate inflamed with scattered exudate and some degree of ulceration extending down into the soft palate down to the arytenoid sinuses.  Inflammatory changes with ulceration extended across the upper esophageal sphincter into the proximal esophagus.  There was a proximal esophageal stricture with circumferential overlying exudate and a pseudodiverticulum just proximal to the area of stricture; this was traversed fairly easily with the diagnostic gastroscope ,however, with this maneuver the stricture was dilated.  patient was noted to have salmon-colored epithelium coming up from the GE junction  confluently all the way to the stricture which was just 3-4 centimeters below the upper esophageal sphincter.  I did not see a tumor.  Stomach empty. Small hiatal hernia.  There was prepyloric antral scarring suggestive of prior peptic ulcer disease.  No active ulcer infiltrating process seen.  Pylorus patent.  The first and second portion of the duodenum appeared normal.  Exudate at the level of the stricture was brushed for KOH prep. Also, biopsies at this level taken.  Retroflexed views revealed as previously described.     The scope was then withdrawn from the patient and the procedure completed.  COMPLICATIONS: There were no immediate complications. EBL 5 mL ENDOSCOPIC IMPRESSION: Severe mucositis/proximal esophagitis. Proximal esophageal stricture?"dilated with scope passage. Status post KOH brushing and biopsy. Barrett's esophagus present?"involving the majority of the tubular esophagus. Gastric antral ulcer scar?"indicative of prior peptic ulcer disease  Recent oral potassium administration may have exacerbated the above inflammation.  RECOMMENDATIONS: Clear liquid diet. Add Carafate suspension. Continue twice a day PPI. Continue Maalox. Discussed with Scott in pharmacy. Will provide Magic mouthwash containing both hydrocortisone and Xylocaine.  If patient is still unable to take orally by the first of the week, we will have to consider alternative means of feeding i.e. NG tube versus a PEG.  Dr. Laural Golden will be seeing over the weekend.    I have discussed my findings and recommendations with multiple family members in the short stay department.  REPEAT EXAM:  eSigned:  R. Garfield Cornea, MD [facility]al[facility] 04-04-15 1:08 PM    CC:  CPT CODES: ICD CODES:  The ICD and CPT codes recommended by this software are interpretations from the data that the clinical staff has captured with the software.  The verification of the translation of this report to the ICD and  CPT codes and modifiers is the sole responsibility of the health care institution and practicing physician where this report was generated.  [place]. will not be held responsible for the validity of the ICD and CPT codes included on this report.  AMA assumes no liability for data contained or not contained herein. CPT is a Designer, television/film set of the [employer].  PATIENT NAME:  Haley Lowe, Haley Lowe MR#: [id number]

## 2015-03-07 NOTE — Progress Notes (Signed)
 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 Her oncologist is at [facility]. She underwent thyroid surgery at [facility]. Presented with shortness of breath, fever, and was noted to have pneumonia. Patient was hospitalized for further management.  Past medical history:  Past Medical History  Diagnosis Date  . Essential hypertension   . Anxiety   . Depression   . COPD (chronic obstructive pulmonary disease) ([facility])   . GERD (gastroesophageal reflux disease)   . Headache(784.0)   . Arthritis   . Hx of adenomatous colonic polyps   . Emphysema   . Cataract     1 lens implaqnt right eye,intact on left eye  . Goiter   . Fibromyalgia   . Pulmonary emboli ([facility]) 11/04/11    Right upper lobe and r lower lobe PE  . Lung nodule seen on imaging study 11/04/11 CT    4mm LLL  . DVT (deep venous thrombosis) ([facility]) 12/09/2011  . Pulmonary embolism ([facility]) 12/09/2011  . Hyperlipidemia   . History of radiation therapy 10/06/11    SRS 15Gy 1fx, brain  . UTI (lower urinary tract infection)   . Borderline diabetes mellitus   . Metastatic adenocarcinoma to brain ([facility]) 09/15/11    Left frontoparietal region  . Thyroid cancer ([facility]) 2015  . S/P radiation therapy 05/31/14 [facility]    Brain    Consultants: Gastroenterology  Procedures: None  Antibiotics: Patient was started on vancomycin and aztreonam 11/27 Patient was switched over to intravenous Levaquin 11/30  Subjective: Patient feels about the same as yesterday. Continues to have difficulty swallowing. Breathing is improved and stable. Continues to have poor oral intake.  Objective: Vital Signs  Filed Vitals:   03/06/15 0543 03/06/15 1135 03/06/15 1459 03/06/15 2100  BP: 135/93 145/98  134/90 128/72  Pulse: 75 79 70 68  Temp: 98.1 F (36.7 C) 98 F (36.7 C) 97.6 F (36.4 C) 98.2 F (36.8 C)  TempSrc: Oral Oral Oral Oral  Resp: 18 18 18 18  Height:      Weight:      SpO2: 98% 96% 96% 95%    Intake/Output Summary (Last 24 hours) at 03/07/15 0818 Last data filed at 03/07/15 0400  Gross per 24 hour  Intake      0 ml  Output    500 ml  Net   -500 ml   Filed Weights   03/03/15 0520  Weight: 66.225 kg (146 lb)    General appearance: alert, cooperative, appears stated age and no distress Denuded mucosa in the oral cavity. Resp: Improving air entry bilaterally. No wheezing, rales or rhonchi.  Cardio: regular rate and rhythm, S1, S2 normal, no murmur, click, rub or gallop GI: soft, non-tender; bowel sounds normal; no masses,  no organomegaly Neurologic: Alert. Cranial nerves II-12 intact. No obvious focal neurological deficits.  Lab Results:  Basic Metabolic Panel:  Recent Labs Lab 03/02/15 0815 03/02/15 2359 03/03/15 0622 03/06/15 1059 03/07/15 0722  NA 132* 132* 135 137 136  K 4.6 3.1* 3.5 3.4* 3.3*  CL 96* 98* 103 102 103  CO2 28 21* 21* 26 23  GLUCOSE 132* 85 141* 118* 97  BUN 13 12 11 14 16  CREATININE 0.53 0.42* 0.44 0.33* 0.34*  CALCIUM 8.3* 8.1* 7.6* 8.3* 8.4*   Liver Function   Tests:  Recent Labs Lab 03/02/15 2359  AST 23  ALT 29  ALKPHOS 51  BILITOT 1.3*  PROT 6.1*  ALBUMIN 2.9*   CBC:  Recent Labs Lab 03/02/15 0815 03/02/15 2359 03/03/15 0622 03/04/15 0938 03/05/15 0658 03/06/15 1059 03/07/15 0722  WBC 2.5* 1.7* 2.6* 1.9* 1.9* 2.1* 2.8*  NEUTROABS 2.2 1.4*  --   --  1.5* 1.8  --   HGB 14.9 14.9 13.8 13.8 13.6 14.4 15.2*  HCT 44.3 44.6 41.8 41.7 41.6 43.1 45.1  MCV 87.5 86.8 88.0 88.0 88.1 87.2 86.7  PLT 69* 59* 60* 62* 72* 78* 86*   Cardiac Enzymes:  Recent Labs Lab 03/02/15 2359  TROPONINI 0.05*     Recent Results (from the past 240 hour(s))  Urine culture     Status: None   Collection Time: 03/02/15  10:55 PM  Result Value Ref Range Status   Specimen Description URINE, CLEAN CATCH  Final   Special Requests NONE  Final   Culture   Final    MULTIPLE SPECIES PRESENT, SUGGEST RECOLLECTION Performed at [facility]    Report Status 03/04/2015 FINAL  Final  Culture, blood (routine x 2)     Status: None (Preliminary result)   Collection Time: 03/02/15 11:59 PM  Result Value Ref Range Status   Specimen Description BLOOD RIGHT HAND  Final   Special Requests BOTTLES DRAWN AEROBIC AND ANAEROBIC 6CC  Final   Culture NO GROWTH 3 DAYS  Final   Report Status PENDING  Incomplete  Culture, blood (routine x 2)     Status: None (Preliminary result)   Collection Time: 03/03/15 12:09 AM  Result Value Ref Range Status   Specimen Description BLOOD LEFT HAND  Final   Special Requests BOTTLES DRAWN AEROBIC AND ANAEROBIC 6CC  Final   Culture NO GROWTH 3 DAYS  Final   Report Status PENDING  Incomplete  Culture, expectorated sputum-assessment     Status: None   Collection Time: 03/04/15 11:05 PM  Result Value Ref Range Status   Specimen Description SPUTUM EXPECTORATED  Final   Special Requests IMMUNE:COMPROMISED  Final   Sputum evaluation   Final    THIS SPECIMEN IS ACCEPTABLE. RESPIRATORY CULTURE REPORT TO FOLLOW. Performed at [facility]    Report Status 03/05/2015 FINAL  Final  Fungus Culture with Smear     Status: None (Preliminary result)   Collection Time: 03/04/15 11:05 PM  Result Value Ref Range Status   Specimen Description SPUTUM EXPECTORATED  Final   Special Requests IMMUNE:COMPROMISED  Final   Fungal Smear   Final    NO YEAST OR FUNGAL ELEMENTS SEEN Performed at [facility]    Culture   Final    CULTURE IN PROGRESS FOR FOUR WEEKS Performed at [facility]    Report Status PENDING  Incomplete  Culture, respiratory (NON-Expectorated)     Status: None (Preliminary result)   Collection Time: 03/04/15 11:05 PM  Result Value Ref Range Status   Specimen  Description SPUTUM EXPECTORATED  Final   Special Requests IMMUNE:COMPROMISED  Final   Gram Stain   Final    ABUNDANT WBC PRESENT, PREDOMINANTLY PMN FEW SQUAMOUS EPITHELIAL CELLS PRESENT ABUNDANT GRAM POSITIVE COCCI IN PAIRS IN CHAINS IN CLUSTERS RARE GRAM NEGATIVE RODS Performed at [facility]    Culture PENDING  Incomplete   Report Status PENDING  Incomplete      Studies/Results: No results found.  Medications:  Scheduled: . amitriptyline  25 mg Oral  QHS  . amLODipine  5 mg Oral Daily  . dexamethasone  2 mg Intravenous Q12H  . fluconazole (DIFLUCAN) IV  100 mg Intravenous Q24H  . home med stored in pyxis  1 each Oral BID  . levofloxacin (LEVAQUIN) IV  750 mg Intravenous Q24H  . levothyroxine  137 mcg Oral QAC breakfast  . magic mouthwash  10 mL Oral QID  . magnesium oxide  400 mg Oral Daily  . pantoprazole  80 mg Oral Daily  . polyethylene glycol  17 g Oral QODAY  . potassium chloride  10 mEq Intravenous Q1 Hr x 4  . sodium chloride  3 mL Intravenous Q12H  . SONAFINE  1 application Topical Daily   Continuous: . sodium chloride 50 mL/hr at 03/05/15 0848   PRN:acetaminophen **OR** acetaminophen, ALPRAZolam, alum & mag hydroxide-simeth, HYDROmorphone (DILAUDID) injection, lidocaine, magic mouthwash, ondansetron **OR** ondansetron (ZOFRAN) IV  Assessment/Plan:  Active Problems:   Metastatic adenocarcinoma to brain, thyroid cancer   Sepsis ([facility])   HCAP (healthcare-associated pneumonia)   Mucositis due to radiation therapy   COPD (chronic obstructive pulmonary disease) ([facility])   Pneumonia   Hyponatremia   Hypokalemia   Leukopenia   Thrombocytopenia ([facility])   Hypothyroid   Protein-calorie malnutrition, severe    Healthcare associated pneumonia Patient met sepsis criteria at the time of admission with fever, tachycardia and tachypnea. She was started on broad-spectrum antibiotics. Cultures are all negative. Sepsis physiology resolved. Patient was changed over  to Levaquin. Oxygen was also titrated down. Saturating well on just 2 L by nasal cannula. Leave her on intravenous antibiotics till she is able to take orally.   Odynophagia/Dysphagia This is quite concerning in view of recent radiation treatment. Gastroenterology was consulted for endoscopy. Appreciate their input. Patient to undergo EGD this morning. Patient also seen by nutritionist. She is likely malnourished. She reports a weight loss of about 8 pounds in the last few days.  Mucositis Secondary to radiation plus possible oropharyngeal candida. Continue lidocaine, magic mouthwash. Also on Diflucan.  Metastatic thyroid cancer with metastases to the brain Continue oral chemotherapy. Continue Decadron. She has recently completed radiation treatment. Followed by oncology at [facility]. According to patient, the dose of her Decadron was to be reduced today. We'll cut back.  History of COPD Stable.  Pancytopenia Cell counts have been stable. Continue to monitor for now.  History of hypothyroidism Continue Synthroid.  DVT Prophylaxis: SCDs    Code Status: Full code  Family Communication: Discussed with the patient  Disposition Plan: Await EGD today. Continue other treatment as outlined. Continue to mobilize as tolerated.      LOS: 4 days   KRISHNAN,GOKUL  [facility] Pager [phone] 03/07/2015, 8:18 AM  If 7PM-7AM, please contact night-coverage at [email or web address], password [email or web address]    

## 2015-03-07 NOTE — Care Management Important Message (Signed)
Important Message  Patient Details  Name: Haley Lowe MRN: [id number] Date of Birth: 19-Oct-1955   Medicare Important Message Given:  Yes    Sherald Barge, RN 03/07/2015, 12:18 PM

## 2015-03-07 NOTE — Anesthesia Preprocedure Evaluation (Signed)
Anesthesia Evaluation  Patient identified by MRN, date of birth, ID band Patient awake    Reviewed: Allergy & Precautions, NPO status , Patient's Chart, lab work & pertinent test results  History of Anesthesia Complications (+) PONV  Airway Mallampati: II  TM Distance: >3 FB     Dental  (+) Teeth Intact   Pulmonary asthma , pneumonia, resolved, COPD,  COPD inhaler and oxygen dependent, former smoker,    Pulmonary exam normal        Cardiovascular hypertension, + Peripheral Vascular Disease  Normal cardiovascular exam     Neuro/Psych  Headaches, Anxiety Depression  Neuromuscular disease    GI/Hepatic GERD  Controlled and Medicated,  Endo/Other  Hypothyroidism   Renal/GU      Musculoskeletal  (+) Arthritis , Osteoarthritis,  Fibromyalgia -  Abdominal Normal abdominal exam  (+)   Peds  Hematology   Anesthesia Other Findings   Reproductive/Obstetrics                             Anesthesia Physical Anesthesia Plan  ASA: III  Anesthesia Plan: MAC   Post-op Pain Management:    Induction: Intravenous  Airway Management Planned: Nasal Cannula  Additional Equipment:   Intra-op Plan:   Post-operative Plan:   Informed Consent: I have reviewed the patients History and Physical, chart, labs and discussed the procedure including the risks, benefits and alternatives for the proposed anesthesia with the patient or authorized representative who has indicated his/her understanding and acceptance.   Dental advisory given  Plan Discussed with: CRNA  Anesthesia Plan Comments:         Anesthesia Quick Evaluation

## 2015-03-07 NOTE — Anesthesia Postprocedure Evaluation (Signed)
Anesthesia Post Note  Patient: Haley Lowe  Procedure(s) Performed: Procedure(s) (LRB): ESOPHAGOGASTRODUODENOSCOPY (EGD) WITH PROPOFOL (N/A) ESOPHAGEAL DILATION (N/A)  Patient location during evaluation: PACU Anesthesia Type: MAC Level of consciousness: awake and alert Pain management: pain level controlled Vital Signs Assessment: post-procedure vital signs reviewed and stable Respiratory status: spontaneous breathing Cardiovascular status: blood pressure returned to baseline Postop Assessment: no signs of nausea or vomiting Anesthetic complications: no    Last Vitals:  Filed Vitals:   03/07/15 1105 03/07/15 1230  BP: 130/92   Pulse:    Temp:  36.7 C  Resp: 21     Last Pain:  Filed Vitals:   03/07/15 1239  PainSc: Asleep                 Dulce Martian

## 2015-03-08 DIAGNOSIS — E43 Unspecified severe protein-calorie malnutrition: Secondary | ICD-10-CM

## 2015-03-08 DIAGNOSIS — A419 Sepsis, unspecified organism: Secondary | ICD-10-CM

## 2015-03-08 DIAGNOSIS — K222 Esophageal obstruction: Secondary | ICD-10-CM

## 2015-03-08 DIAGNOSIS — C7931 Secondary malignant neoplasm of brain: Secondary | ICD-10-CM

## 2015-03-08 DIAGNOSIS — K1230 Oral mucositis (ulcerative), unspecified: Secondary | ICD-10-CM

## 2015-03-08 DIAGNOSIS — R1314 Dysphagia, pharyngoesophageal phase: Secondary | ICD-10-CM

## 2015-03-08 LAB — BASIC METABOLIC PANEL
Anion gap: 6 (ref 5–15)
BUN: 14 mg/dL (ref 6–20)
CHLORIDE: 107 mmol/L (ref 101–111)
CO2: 22 mmol/L (ref 22–32)
Calcium: 8 mg/dL — ABNORMAL LOW (ref 8.9–10.3)
Glucose, Bld: 123 mg/dL — ABNORMAL HIGH (ref 65–99)
Potassium: 3.4 mmol/L — ABNORMAL LOW (ref 3.5–5.1)
SODIUM: 135 mmol/L (ref 135–145)

## 2015-03-08 LAB — CULTURE, BLOOD (ROUTINE X 2)
CULTURE: NO GROWTH
Culture: NO GROWTH

## 2015-03-08 LAB — CBC
HCT: 41.8 % (ref 36.0–46.0)
Hemoglobin: 13.9 g/dL (ref 12.0–15.0)
MCH: 28.9 pg (ref 26.0–34.0)
MCHC: 33.3 g/dL (ref 30.0–36.0)
MCV: 86.9 fL (ref 78.0–100.0)
PLATELETS: 105 10*3/uL — AB (ref 150–400)
RBC: 4.81 MIL/uL (ref 3.87–5.11)
RDW: 15.5 % (ref 11.5–15.5)
WBC: 2.5 10*3/uL — ABNORMAL LOW (ref 4.0–10.5)

## 2015-03-08 LAB — MAGNESIUM: MAGNESIUM: 2.1 mg/dL (ref 1.7–2.4)

## 2015-03-08 MED ORDER — POTASSIUM CHLORIDE 10 MEQ/100ML IV SOLN
INTRAVENOUS | Status: AC
  Administered 2015-03-08: 10 meq via INTRAVENOUS
  Filled 2015-03-08: qty 100

## 2015-03-08 MED ORDER — POTASSIUM CHLORIDE 10 MEQ/100ML IV SOLN
10.0000 meq | INTRAVENOUS | Status: AC
  Administered 2015-03-08 (×2): 10 meq via INTRAVENOUS
  Filled 2015-03-08: qty 100

## 2015-03-08 MED ORDER — ENSURE ENLIVE PO LIQD
237.0000 mL | Freq: Three times a day (TID) | ORAL | Status: DC
  Administered 2015-03-09 – 2015-03-13 (×10): 237 mL via ORAL

## 2015-03-08 MED ORDER — BOOST PLUS PO LIQD: 237.0000 mL | Freq: Three times a day (TID) | ORAL | Status: DC

## 2015-03-08 MED ORDER — PANTOPRAZOLE SODIUM 40 MG IV SOLR
40.0000 mg | Freq: Two times a day (BID) | INTRAVENOUS | Status: DC
  Administered 2015-03-08 – 2015-03-12 (×10): 40 mg via INTRAVENOUS
  Filled 2015-03-08 (×10): qty 40

## 2015-03-08 NOTE — Progress Notes (Signed)
Patient stated that she took her home dose of Synthroid because she can swallow the one pill, instead of the two tablets.  Patient also states that she has been taking her home med of Lenvima 14 mg daily from her cancer doctor.  Patient educated on not taking medications from home due to interactions with medicine.  Dr. Maryland Pink notified.  Patient's Synthroid to be sent to pharmacy.  Patient instructed to stop taking the Lenvima per Dr. Maryland Pink and to have her husband take the medication home.  Patient verbalized understanding.

## 2015-03-08 NOTE — Progress Notes (Signed)
Patient has Lactose Free Boost ordered.  Pharmacy contacted and [facility] [employer] contacted and unable to find Lactose Free Chocolate Boost in stock.  Dr. Laural Golden notified and gave order to change to Ensure three times a day with meals.  Lactose free not needed.

## 2015-03-08 NOTE — Progress Notes (Signed)
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 Her oncologist is at [facility]. She underwent thyroid surgery at [facility]. Presented with shortness of breath, fever, and was noted to have pneumonia. Patient was hospitalized for further management.  Past medical history:  Past Medical History  Diagnosis Date  . Essential hypertension   . Anxiety   . Depression   . COPD (chronic obstructive pulmonary disease) ([place])   . GERD (gastroesophageal reflux disease)   . Headache(784.0)   . Arthritis   . Hx of adenomatous colonic polyps   . Emphysema   . Cataract     1 lens implaqnt right eye,intact on left eye  . Goiter   . Fibromyalgia   . Pulmonary emboli ([place]) 11/04/11    Right upper lobe and r lower lobe PE  . Lung nodule seen on imaging study 11/04/11 CT    67mm LLL  . DVT (deep venous thrombosis) ([place]) 12/09/2011  . Pulmonary embolism ([place]) 12/09/2011  . Hyperlipidemia   . History of radiation therapy 10/06/11    SRS 15Gy 72fx, brain  . UTI (lower urinary tract infection)   . Borderline diabetes mellitus   . Metastatic adenocarcinoma to brain ([place]) 09/15/11    Left frontoparietal region  . Thyroid cancer ([place]) 2015  . S/P radiation therapy 05/31/14 SRS    Brain  . PONV (postoperative nausea and vomiting)     Consultants: Gastroenterology  Procedures:  EGD 12/2 ENDOSCOPIC IMPRESSION: Severe mucositis/proximal esophagitis. Proximal esophageal stricture dilated with scope passage. Status post KOH brushing and biopsy. Barrett's esophagus present"involving the majority of the tubular esophagus. Gastric antral ulcer scar indicative of prior peptic ulcer disease   Antibiotics: Patient was started on vancomycin and aztreonam  11/27 Patient was switched over to intravenous Levaquin 11/30  Subjective: Patient feels slightly better this morning. Able to swallow clear liquids. Less pain with swallowing. Continues to have some cough but breathing is much improved. Continues to have poor oral intake. Questing advancement in diet.  Objective: Vital Signs  Filed Vitals:   03/07/15 1320 03/07/15 1530 03/07/15 2141 03/08/15 0507  BP: 128/90 134/78 156/99 140/94  Pulse: 91 68 84 73  Temp: 98.3 F (36.8 C) 98.5 F (36.9 C) 98.3 F (36.8 C) 97.9 F (36.6 C)  TempSrc:  Oral Oral Oral  Resp: $Remo'18 18 18 18  'dleLU$ Height:      Weight:      SpO2: 93% 97% 97% 94%    Intake/Output Summary (Last 24 hours) at 03/08/15 0824 Last data filed at 03/08/15 0540  Gross per 24 hour  Intake 5524.58 ml  Output      0 ml  Net 5524.58 ml   Filed Weights   03/03/15 0520  Weight: 66.225 kg (146 lb)    General appearance: alert, cooperative, appears stated age and no distress Denuded mucosa in the oral cavity. Resp: Clear to auscultation bilaterally. No wheezing, rales or rhonchi.  Cardio: regular rate and rhythm, S1, S2 normal, no murmur, click, rub or gallop GI: soft, non-tender; bowel sounds normal; no masses,  no organomegaly Neurologic: Alert. Cranial nerves II-12 intact. No obvious focal neurological deficits.  Lab Results:  Basic Metabolic Panel:  Recent Labs Lab 03/02/15 2359 03/03/15 0622 03/06/15 1059 03/07/15 0722 03/08/15 0603  NA 132* 135 137  136 135  K 3.1* 3.5 3.4* 3.3* 3.4*  CL 98* 103 102 103 107  CO2 21* 21* $Remov'26 23 22  'YYzLkQ$ GLUCOSE 85 141* 118* 97 123*  BUN $Re'12 11 14 16 14  'idz$ CREATININE 0.42* 0.44 0.33* 0.34* <0.30*  CALCIUM 8.1* 7.6* 8.3* 8.4* 8.0*   Liver Function Tests:  Recent Labs Lab 03/02/15 2359  AST 23  ALT 29  ALKPHOS 51  BILITOT 1.3*  PROT 6.1*  ALBUMIN 2.9*   CBC:  Recent Labs Lab 03/02/15 0815 03/02/15 2359  03/04/15 0938 03/05/15 0658 03/06/15 1059 03/07/15 0722  03/08/15 0603  WBC 2.5* 1.7*  < > 1.9* 1.9* 2.1* 2.8* 2.5*  NEUTROABS 2.2 1.4*  --   --  1.5* 1.8  --   --   HGB 14.9 14.9  < > 13.8 13.6 14.4 15.2* 13.9  HCT 44.3 44.6  < > 41.7 41.6 43.1 45.1 41.8  MCV 87.5 86.8  < > 88.0 88.1 87.2 86.7 86.9  PLT 69* 59*  < > 62* 72* 78* 86* 105*  < > = values in this interval not displayed. Cardiac Enzymes:  Recent Labs Lab 03/02/15 2359  TROPONINI 0.05*     Recent Results (from the past 240 hour(s))  Urine culture     Status: None   Collection Time: 03/02/15 10:55 PM  Result Value Ref Range Status   Specimen Description URINE, CLEAN CATCH  Final   Special Requests NONE  Final   Culture   Final    MULTIPLE SPECIES PRESENT, SUGGEST RECOLLECTION Performed at [facility]    Report Status 03/04/2015 FINAL  Final  Culture, blood (routine x 2)     Status: None   Collection Time: 03/02/15 11:59 PM  Result Value Ref Range Status   Specimen Description BLOOD RIGHT HAND  Final   Special Requests BOTTLES DRAWN AEROBIC AND ANAEROBIC 6CC  Final   Culture NO GROWTH 5 DAYS  Final   Report Status 03/08/2015 FINAL  Final  Culture, blood (routine x 2)     Status: None   Collection Time: 03/03/15 12:09 AM  Result Value Ref Range Status   Specimen Description BLOOD LEFT HAND  Final   Special Requests BOTTLES DRAWN AEROBIC AND ANAEROBIC 6CC  Final   Culture NO GROWTH 5 DAYS  Final   Report Status 03/08/2015 FINAL  Final  Culture, expectorated sputum-assessment     Status: None   Collection Time: 03/04/15 11:05 PM  Result Value Ref Range Status   Specimen Description SPUTUM EXPECTORATED  Final   Special Requests IMMUNE:COMPROMISED  Final   Sputum evaluation   Final    THIS SPECIMEN IS ACCEPTABLE. RESPIRATORY CULTURE REPORT TO FOLLOW. Performed at [facility]    Report Status 03/05/2015 FINAL  Final  Fungus Culture with Smear     Status: None (Preliminary result)   Collection Time: 03/04/15 11:05 PM  Result Value Ref Range Status    Specimen Description SPUTUM EXPECTORATED  Final   Special Requests IMMUNE:COMPROMISED  Final   Fungal Smear   Final    NO YEAST OR FUNGAL ELEMENTS SEEN Performed at [facility]    Culture   Final    CULTURE IN PROGRESS FOR FOUR WEEKS Performed at [facility]    Report Status PENDING  Incomplete  Culture, respiratory (NON-Expectorated)     Status: None (Preliminary result)   Collection Time: 03/04/15 11:05 PM  Result Value Ref Range Status   Specimen Description SPUTUM EXPECTORATED  Final   Special Requests IMMUNE:COMPROMISED  Final   Gram Stain   Final    ABUNDANT WBC PRESENT, PREDOMINANTLY PMN FEW SQUAMOUS EPITHELIAL CELLS PRESENT ABUNDANT GRAM POSITIVE COCCI IN PAIRS IN CHAINS IN CLUSTERS RARE GRAM NEGATIVE RODS Performed at [facility]    Culture PENDING  Incomplete   Report Status PENDING  Incomplete  Surgical pcr screen     Status: None   Collection Time: 03/07/15  6:16 AM  Result Value Ref Range Status   MRSA, PCR NEGATIVE NEGATIVE Final   Staphylococcus aureus NEGATIVE NEGATIVE Final    Comment:        The Xpert SA Assay (FDA approved for NASAL specimens in patients over 21 years of age), is one component of a comprehensive surveillance program.  Test performance has been validated by [facility] for patients greater than or equal to 51 year old. It is not intended to diagnose infection nor to guide or monitor treatment.   KOH prep     Status: None   Collection Time: 03/07/15 12:37 PM  Result Value Ref Range Status   Specimen Description ESOPHAGUS  Final   Special Requests NONE  Final   KOH Prep FEW YEAST  Final   Report Status 03/07/2015 FINAL  Final      Studies/Results: No results found.  Medications:  Scheduled: . amitriptyline  25 mg Oral QHS  . amLODipine  5 mg Oral Daily  . dexamethasone  2 mg Intravenous Q24H  . fluconazole (DIFLUCAN) IV  100 mg Intravenous Q24H  . home med stored in pyxis  1 each Oral BID  .  levofloxacin (LEVAQUIN) IV  750 mg Intravenous Q24H  . levothyroxine  137 mcg Oral QAC breakfast  . magic mouthwash w/lidocaine  15 mL Oral QID  . magnesium oxide  400 mg Oral Daily  . pantoprazole  80 mg Oral Daily  . polyethylene glycol  17 g Oral QODAY  . sodium chloride  3 mL Intravenous Q12H  . sucralfate  1 g Oral TID WC & HS   Continuous: . sodium chloride 50 mL/hr at 03/05/15 0848   LNZ:VJKQASUORVIFB **OR** acetaminophen, ALPRAZolam, alum & mag hydroxide-simeth, HYDROmorphone (DILAUDID) injection, ondansetron **OR** ondansetron (ZOFRAN) IV  Assessment/Plan:  Active Problems:   Metastatic adenocarcinoma to brain, thyroid cancer   Sepsis ([place])   HCAP (healthcare-associated pneumonia)   Mucositis due to radiation therapy   COPD (chronic obstructive pulmonary disease) ([facility])   Pneumonia   Hyponatremia   Hypokalemia   Leukopenia   Thrombocytopenia ([facility])   Hypothyroid   Protein-calorie malnutrition, severe   Dysphagia   Odynophagia   Barrett's esophagus with dysplasia   Esophageal reflux   Metastatic cancer ([facility])   Mucosal abnormality of esophagus     Odynophagia/Dysphagia/esophagitis/esophageal stricture/esophageal candidiasis Patient underwent EGD 12/2. This revealed severe esophageal is with stricture. Evidence for Barrett's esophagus was also noted. Appreciate gastroenterology input. KOH was positive suggesting yeast. Continue Diflucan. Patient is feeling slightly better this morning. She has been able to tolerate clear liquids. Diet to be advanced to full liquids today. If she is unable to take adequate calories by mouth. We may have to consider tube feedings. Patient also seen by nutritionist. She is likely malnourished. She reports a weight loss of about 8 pounds in the last few days.  Healthcare associated pneumonia Patient has significantly improved. Patient met sepsis criteria at the time of admission with fever, tachycardia and tachypnea. She was started on  broad-spectrum antibiotics. Cultures are all  negative. Sepsis physiology resolved. Patient was changed over to [place]. Oxygen was also titrated down. Saturating well on just 2 L by nasal cannula. Leave her on intravenous antibiotics till she is able to take orally.   Mucositis Secondary to radiation plus possible oropharyngeal candida. Continue lidocaine, magic mouthwash. Also on Diflucan.  Metastatic thyroid cancer with metastases to the brain Continue oral chemotherapy. Continue Decadron. She has recently completed radiation treatment. Followed by oncology at [facility]. Will discuss with her oncologist Christene Slates, M.D.) on Monday.  Protein-calorie malnutrition, severe As above.  History of COPD Stable.  Pancytopenia Cell counts have been stable. Continue to monitor for now.  History of hypothyroidism Continue Synthroid.  DVT Prophylaxis: SCDs    Code Status: Full code  Family Communication: Discussed with the patient  Disposition Plan: Continue treatment as outlined above.      LOS: 5 days   [facility] Pager [phone] 03/08/2015, 8:24 AM  If 7PM-7AM, please contact night-coverage at [email or web address], password [facility]

## 2015-03-08 NOTE — Progress Notes (Signed)
  Subjective:  Patient continues to complain of pain on swallowing. She is having difficulty with pills. She is having pain even when drinking fluids. She has not had any vomiting. She has noted thick saliva. Her daughter states ulcer over her time has decreased at least 50%. She denies chest pain shortness of breath or abdominal pain.  Objective: Blood pressure 140/94, pulse 73, temperature 97.9 F (36.6 C), temperature source Oral, resp. rate 18, height '5\' 3"'$  (1.6 m), weight 146 lb (66.225 kg), SpO2 94 %. Patient is alert and sitting upright in bed. She is having to wipe thick saliva with tissue paper frequently. She has dried scabs over upper and lower lip. Lingual ulcers noted. Abdomen is soft and nontender. No LE edema or clubbing noted.  Labs/studies Results:   Recent Labs  03/06/15 1059 03/07/15 0722 03/08/15 0603  WBC 2.1* 2.8* 2.5*  HGB 14.4 15.2* 13.9  HCT 43.1 45.1 41.8  PLT 78* 86* 105*    BMET   Recent Labs  03/06/15 1059 03/07/15 0722 03/08/15 0603  NA 137 136 135  K 3.4* 3.3* 3.4*  CL 102 103 107  CO2 '26 23 22  '$ GLUCOSE 118* 97 123*  BUN '14 16 14  '$ CREATININE 0.33* 0.34* <0.30*  CALCIUM 8.3* 8.4* 8.0*     KOH prep. Few yeast noted.   Assessment:  #1. Odynophagia/dysphagia secondary to mucositis, esophagitis secondary to Candida reflux and radiation injury. Minimal improvement in ability to swallow in the last 24 hours. Patient is on IV fluconazole as well as Magic mouthwash. #2. Esophageal stricture noted on EGD by Dr. Gala Romney yesterday. This stricture was dilated with the scope and may have to be further dilated Lucianne Lei esophagitis is healed and she remains with dysphagia. #3. Chronic GERD complicated by Barrett's esophagus. Patient having difficulty swallowing oral PPI. #4. Sepsis and pneumonia. Patient remains on IV levofloxacin and fluconazole. Blood cultures remain negative. #5. Metastatic thyroid carcinoma. Radiation therapy on hold secondary to acute  illness. #6. Similar malnutrition secondary to acute and chronic illness.  Recommendations:  Patient encouraged to take small sips when she drinks liquids. Lactose-free boost 1 can by mouth twice a day. Change pantoprazole to IV route until odontophagia and improves.

## 2015-03-09 DIAGNOSIS — K209 Esophagitis, unspecified without bleeding: Secondary | ICD-10-CM | POA: Diagnosis present

## 2015-03-09 LAB — BASIC METABOLIC PANEL
ANION GAP: 8 (ref 5–15)
BUN: 12 mg/dL (ref 6–20)
CO2: 23 mmol/L (ref 22–32)
Calcium: 8.3 mg/dL — ABNORMAL LOW (ref 8.9–10.3)
Chloride: 104 mmol/L (ref 101–111)
Creatinine, Ser: 0.32 mg/dL — ABNORMAL LOW (ref 0.44–1.00)
GLUCOSE: 119 mg/dL — AB (ref 65–99)
POTASSIUM: 3.7 mmol/L (ref 3.5–5.1)
Sodium: 135 mmol/L (ref 135–145)

## 2015-03-09 LAB — MAGNESIUM: MAGNESIUM: 2 mg/dL (ref 1.7–2.4)

## 2015-03-09 MED ORDER — POTASSIUM CHLORIDE 10 MEQ/100ML IV SOLN
10.0000 meq | INTRAVENOUS | Status: AC
  Administered 2015-03-08 – 2015-03-09 (×4): 10 meq via INTRAVENOUS
  Filled 2015-03-09 (×2): qty 100

## 2015-03-09 MED ORDER — POTASSIUM CHLORIDE 10 MEQ/100ML IV SOLN
10.0000 meq | Freq: Once | INTRAVENOUS | Status: AC
  Administered 2015-03-09: 10 meq via INTRAVENOUS

## 2015-03-09 MED ORDER — LEVOTHYROXINE SODIUM 137 MCG PO TABS
137.0000 ug | ORAL_TABLET | Freq: Every day | ORAL | Status: DC
  Administered 2015-03-09 – 2015-03-13 (×5): 137 ug via ORAL
  Filled 2015-03-09 (×2): qty 1

## 2015-03-09 MED ORDER — LEVOTHYROXINE SODIUM 137 MCG PO TABS
137.0000 ug | ORAL_TABLET | Freq: Once | ORAL | Status: AC
  Administered 2015-03-09: 137 ug via ORAL

## 2015-03-09 NOTE — Addendum Note (Signed)
Addendum  created 03/09/15 1304 by Ollen Bowl, CRNA   Modules edited: Anesthesia Events, Clinical Notes, Narrator   Clinical Notes:  File: [id number]   Narrator:  Narrator: Event Log Edited

## 2015-03-09 NOTE — Progress Notes (Signed)
  Subjective:  Patient states she feels better than she did yesterday. She's having less pain on swallowing. Soreness in mouth also has decreased. She states she is having to spit her saliva less often today.    Objective: Blood pressure 127/87, pulse 79, temperature 98.5 F (36.9 C), temperature source Oral, resp. rate 18, height '5\' 3"'$  (1.6 m), weight 146 lb (66.225 kg), SpO2 92 %. Patient is alert and sitting upright in her bed.  She is in no acute distress. She is sipping on ensure. Superficial lingual ulcer close to tongue tip. Call her next scar but no masses or adenopathy. Abdomen soft and nontender. No LE edema or clubbing noted.  Labs/studies Results:   Recent Labs  03/07/15 0722 03/08/15 0603  WBC 2.8* 2.5*  HGB 15.2* 13.9  HCT 45.1 41.8  PLT 86* 105*    BMET   Recent Labs  03/07/15 0722 03/08/15 0603 03/09/15 0602  NA 136 135 135  K 3.3* 3.4* 3.7  CL 103 107 104  CO2 '23 22 23  '$ GLUCOSE 97 123* 119*  BUN '16 14 12  '$ CREATININE 0.34* <0.30* 0.32*  CALCIUM 8.4* 8.0* 8.3*     Assessment: #1. Odynophagia/dysphagia secondary to mucositis, esophagitis secondary to Candida reflux and radiation injury. Modest symptomatically improvement in the last 24 hours. Oral intake improving but not optimal. Patient is on IV fluconazole as well as Magic mouthwash. #2. Esophageal stricture noted on EGD by Dr. Gala Romney 2 days ago. This stricture was only dilated with scope because of ongoing inflammation. Need for further dilation with a pen on her symptoms and clinical course. #3. Chronic GERD complicated by Barrett's esophagus. #4. Sepsis and pneumonia. Patient remains on IV levofloxacin and fluconazole. Blood cultures remain negative. #5. Metastatic thyroid carcinoma. Patient states she has received 15 treatments to her brain and neck so far. #6. Similar malnutrition secondary to acute and chronic illness.  Recommendations:  Continue IV pantoprazole for another 24-48  hours. Consider advancing diet after evaluation tomorrow morning. Await esophageal biopsy results; should be out tomorrow.

## 2015-03-09 NOTE — Anesthesia Postprocedure Evaluation (Signed)
Anesthesia Post Note  Patient: Haley Lowe  Procedure(s) Performed: Procedure(s) (LRB): ESOPHAGOGASTRODUODENOSCOPY (EGD) WITH PROPOFOL (N/A) ESOPHAGEAL DILATION (N/A)  Patient location during evaluation: Nursing Unit Anesthesia Type: MAC Level of consciousness: awake and alert Pain management: pain level controlled Vital Signs Assessment: post-procedure vital signs reviewed and stable Respiratory status: spontaneous breathing Cardiovascular status: blood pressure returned to baseline : difficulty with mouth pain and swallowing due to oral lesions. Anesthetic complications: no    Last Vitals:  Filed Vitals:   03/09/15 0520 03/09/15 1255  BP: 127/87 115/81  Pulse: 79 81  Temp: 36.9 C 36.7 C  Resp: 18 18    Last Pain:  Filed Vitals:   03/09/15 1255  PainSc: 9                  Mahlon Gabrielle

## 2015-03-09 NOTE — Progress Notes (Signed)
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 Her oncologist is at [facility]. She underwent thyroid surgery at [facility]. Presented with shortness of breath, fever, and was noted to have pneumonia. Patient was hospitalized for further management. Her pneumonia improved. However, she did complain of odynophagia and dysphagia. Gastroenterology was consulted. She underwent EGD which showed severe esophagitis.  Past medical history:  Past Medical History  Diagnosis Date  . Essential hypertension   . Anxiety   . Depression   . COPD (chronic obstructive pulmonary disease) ([place])   . GERD (gastroesophageal reflux disease)   . Headache(784.0)   . Arthritis   . Hx of adenomatous colonic polyps   . Emphysema   . Cataract     1 lens implaqnt right eye,intact on left eye  . Goiter   . Fibromyalgia   . Pulmonary emboli ([place]) 11/04/11    Right upper lobe and r lower lobe PE  . Lung nodule seen on imaging study 11/04/11 CT    64m LLL  . DVT (deep venous thrombosis) ([place] 12/09/2011  . Pulmonary embolism ([place] 12/09/2011  . Hyperlipidemia   . History of radiation therapy 10/06/11    SRS 15Gy 133f brain  . UTI (lower urinary tract infection)   . Borderline diabetes mellitus   . Metastatic adenocarcinoma to brain ([facility] [place]13    Left frontoparietal region  . Thyroid cancer ([facility]  . S/P radiation therapy 05/31/14 SRS    Brain  . PONV (postoperative nausea and vomiting)     Consultants: Gastroenterology  Procedures:  EGD 12/2 ENDOSCOPIC IMPRESSION: Severe mucositis/proximal esophagitis. Proximal esophageal stricture dilated with scope passage. Status post KOH brushing and biopsy. Barrett's esophagus present"involving the majority  of the tubular esophagus. Gastric antral ulcer scar indicative of prior peptic ulcer disease   Antibiotics: Patient was started on vancomycin and aztreonam 11/27 Patient was switched over to intravenous Levaquin 11/30  Subjective: Patient continues to feel better. She is able to tolerate her full liquids. Less pain with swallowing. Less difficulty with swallowing.   Objective: Vital Signs  Filed Vitals:   03/08/15 1254 03/08/15 2023 03/08/15 2059 03/09/15 0520  BP: 144/93  131/87 127/87  Pulse: 73 81 73 79  Temp: 98 F (36.7 C)  98.2 F (36.8 C) 98.5 F (36.9 C)  TempSrc: Oral  Oral Oral  Resp: _0 Height:      Weight:      SpO2: 96% 93% 92% 92%    Intake/Output Summary (Last 24 hours) at 03/09/15 0754 Last data filed at 03/09/15 0500  Gross per 24 hour  Intake 1886.67 ml  Output      0 ml  Net 1886.67 ml   Filed Weights   03/03/15 0520  Weight: 66.225 kg (146 lb)    General appearance: alert, cooperative, and no distress Denuded mucosa in the oral cavity. Resp: Clear to auscultation bilaterally. No wheezing, rales or rhonchi.  Cardio: regular rate and rhythm, S1, S2 normal, no murmur, click, rub or gallop GI: soft, non-tender; bowel sounds normal; no masses,  no organomegaly Neurologic: Alert. Cranial nerves II-12 intact. No obvious focal neurological deficits.  Lab Results:  Basic Metabolic Panel:  Recent Labs Lab 03/03/15 0622 03/06/15 1059 03/07/15 0722 03/08/15 0603 03/09/15 0602  NA 135  137 136 135 135  K 3.5 3.4* 3.3* 3.4* 3.7  CL 103 102 103 107 104  CO2 21* _0 GLUCOSE 141* 118* 97 123* 119*  BUN _1 CREATININE 0.44 0.33* 0.34* <0.30* 0.32*  CALCIUM 7.6* 8.3* 8.4* 8.0* 8.3*  MG  --   --   --  2.1 2.0   Liver Function Tests:  Recent Labs Lab 03/02/15 2359  AST 23  ALT 29  ALKPHOS 51  BILITOT 1.3*  PROT 6.1*  ALBUMIN 2.9*   CBC:  Recent Labs Lab 03/02/15 0815 03/02/15 2359  03/04/15 0938  03/05/15 0658 03/06/15 1059 03/07/15 0722 03/08/15 0603  WBC 2.5* 1.7*  < > 1.9* 1.9* 2.1* 2.8* 2.5*  NEUTROABS 2.2 1.4*  --   --  1.5* 1.8  --   --   HGB 14.9 14.9  < > 13.8 13.6 14.4 15.2* 13.9  HCT 44.3 44.6  < > 41.7 41.6 43.1 45.1 41.8  MCV 87.5 86.8  < > 88.0 88.1 87.2 86.7 86.9  PLT 69* 59*  < > 62* 72* 78* 86* 105*  < > = values in this interval not displayed. Cardiac Enzymes:  Recent Labs Lab 03/02/15 2359  TROPONINI 0.05*     Recent Results (from the past 240 hour(s))  Urine culture     Status: None   Collection Time: 03/02/15 10:55 PM  Result Value Ref Range Status   Specimen Description URINE, CLEAN CATCH  Final   Special Requests NONE  Final   Culture   Final    MULTIPLE SPECIES PRESENT, SUGGEST RECOLLECTION Performed at [facility]    Report Status 03/04/2015 FINAL  Final  Culture, blood (routine x 2)     Status: None   Collection Time: 03/02/15 11:59 PM  Result Value Ref Range Status   Specimen Description BLOOD RIGHT HAND  Final   Special Requests BOTTLES DRAWN AEROBIC AND ANAEROBIC 6CC  Final   Culture NO GROWTH 5 DAYS  Final   Report Status 03/08/2015 FINAL  Final  Culture, blood (routine x 2)     Status: None   Collection Time: 03/03/15 12:09 AM  Result Value Ref Range Status   Specimen Description BLOOD LEFT HAND  Final   Special Requests BOTTLES DRAWN AEROBIC AND ANAEROBIC 6CC  Final   Culture NO GROWTH 5 DAYS  Final   Report Status 03/08/2015 FINAL  Final  Culture, expectorated sputum-assessment     Status: None   Collection Time: 03/04/15 11:05 PM  Result Value Ref Range Status   Specimen Description SPUTUM EXPECTORATED  Final   Special Requests IMMUNE:COMPROMISED  Final   Sputum evaluation   Final    THIS SPECIMEN IS ACCEPTABLE. RESPIRATORY CULTURE REPORT TO FOLLOW. Performed at [facility]    Report Status 03/05/2015 FINAL  Final  Fungus Culture with Smear     Status: None (Preliminary result)   Collection Time:  03/04/15 11:05 PM  Result Value Ref Range Status   Specimen Description SPUTUM EXPECTORATED  Final   Special Requests IMMUNE:COMPROMISED  Final   Fungal Smear   Final    NO YEAST OR FUNGAL ELEMENTS SEEN Performed at [facility]    Culture   Final    CULTURE IN PROGRESS FOR FOUR WEEKS Performed at [facility]    Report Status PENDING  Incomplete  Culture, respiratory (NON-Expectorated)     Status: None (Preliminary result)   Collection Time: 03/04/15  11:05 PM  Result Value Ref Range Status   Specimen Description SPUTUM EXPECTORATED  Final   Special Requests IMMUNE:COMPROMISED  Final   Gram Stain   Final    ABUNDANT WBC PRESENT, PREDOMINANTLY PMN FEW SQUAMOUS EPITHELIAL CELLS PRESENT ABUNDANT GRAM POSITIVE COCCI IN PAIRS IN CHAINS IN CLUSTERS RARE GRAM NEGATIVE RODS Performed at [facility]    Culture PENDING  Incomplete   Report Status PENDING  Incomplete  Surgical pcr screen     Status: None   Collection Time: 03/07/15  6:16 AM  Result Value Ref Range Status   MRSA, PCR NEGATIVE NEGATIVE Final   Staphylococcus aureus NEGATIVE NEGATIVE Final    Comment:        The Xpert SA Assay (FDA approved for NASAL specimens in patients over 92 years of age), is one component of a comprehensive surveillance program.  Test performance has been validated by [facility] for patients greater than or equal to 59 year old. It is not intended to diagnose infection nor to guide or monitor treatment.   KOH prep     Status: None   Collection Time: 03/07/15 12:37 PM  Result Value Ref Range Status   Specimen Description ESOPHAGUS  Final   Special Requests NONE  Final   KOH Prep FEW YEAST  Final   Report Status 03/07/2015 FINAL  Final      Studies/Results: No results found.  Medications:  Scheduled: . amitriptyline  25 mg Oral QHS  . amLODipine  5 mg Oral Daily  . dexamethasone  2 mg Intravenous Q24H  . feeding supplement (ENSURE ENLIVE)  237 mL Oral TID  WC  . fluconazole (DIFLUCAN) IV  100 mg Intravenous Q24H  . home med stored in pyxis  1 each Oral BID  . levofloxacin (LEVAQUIN) IV  750 mg Intravenous Q24H  . levothyroxine  137 mcg Oral QAC breakfast  . magic mouthwash w/lidocaine  15 mL Oral QID  . magnesium oxide  400 mg Oral Daily  . pantoprazole (PROTONIX) IV  40 mg Intravenous Q12H  . polyethylene glycol  17 g Oral QODAY  . sodium chloride  3 mL Intravenous Q12H  . sucralfate  1 g Oral TID WC & HS   Continuous: . sodium chloride 50 mL/hr at 03/05/15 0848   QBH:[id number] **OR** acetaminophen, ALPRAZolam, alum & mag hydroxide-simeth, HYDROmorphone (DILAUDID) injection, ondansetron **OR** ondansetron (ZOFRAN) IV  Assessment/Plan:  Active Problems:   Metastatic adenocarcinoma to brain, thyroid cancer   Sepsis ([place])   HCAP (healthcare-associated pneumonia)   Mucositis due to radiation therapy   COPD (chronic obstructive pulmonary disease) (H[facility])   Pneumonia   Hyponatremia   Hypokalemia   Leukopenia   Thrombocytopenia (H[facility])   Hypothyroid   Protein-calorie malnutrition, severe   Dysphagia   Odynophagia   Barrett's esophagus with dysplasia   Esophageal reflux   Metastatic cancer ([facility])   Mucosal abnormality of esophagus     Odynophagia/Dysphagia/esophagitis/esophageal stricture/esophageal candidiasis Patient underwent EGD 12/2. This revealed severe esophagitis with stricture. Evidence for Barrett's esophagus was also noted. Appreciate gastroenterology input. KOH was positive suggesting yeast. Continue Diflucan. Patient continues to improve slowly. She is tolerating full liquids. Defer further advancement to gastroenterology for now. Will benefit from calorie count starting tomorrow. Even though she seems to be turning the corner, use of feeding tube for nutritional purposes has not been ruled out completely. Patient also seen by nutritionist. She is likely malnourished. She reports a weight loss of about 8 pounds in the  last few days.  Healthcare associated pneumonia Patient has significantly improved. Patient met sepsis criteria at the time of admission with fever, tachycardia and tachypnea. She was started on broad-spectrum antibiotics. Cultures are all negative. Sepsis physiology resolved. Patient was changed over to [place]. Oxygen was also titrated down. Saturating well on just 2 L by nasal cannula. Leave her on intravenous antibiotics till she is consistently able to take orally.   Mucositis Secondary to radiation plus possible oropharyngeal candida. Continue lidocaine, magic mouthwash. Also on Diflucan.  Metastatic thyroid cancer with metastases to the brain Continue Decadron. Holding her Lenvatinib for now. Is known to cause GI perforation. She has recently completed radiation treatment. Followed by oncology at [facility]. Will discuss with her oncologist Christene Slates, M.D.) on Monday.  Protein-calorie malnutrition, severe As above.  History of COPD Stable.  Pancytopenia Cell counts have been stable. Continue to monitor for now.  History of hypothyroidism Continue Synthroid.  DVT Prophylaxis: SCDs    Code Status: Full code  Family Communication: Discussed with the patient  Disposition Plan: Continue treatment as outlined above. Continue to mobilize.     LOS: 6 days   [place] [facility] Pager [phone] 03/09/2015, 7:54 AM  If 7PM-7AM, please contact night-coverage at [email or web address], password [facility]

## 2015-03-10 ENCOUNTER — Encounter (HOSPITAL_COMMUNITY): Payer: Self-pay | Admitting: Internal Medicine

## 2015-03-10 DIAGNOSIS — K1230 Oral mucositis (ulcerative), unspecified: Secondary | ICD-10-CM | POA: Insufficient documentation

## 2015-03-10 DIAGNOSIS — K209 Esophagitis, unspecified: Secondary | ICD-10-CM

## 2015-03-10 LAB — BASIC METABOLIC PANEL
Anion gap: 10 (ref 5–15)
BUN: 10 mg/dL (ref 6–20)
CALCIUM: 8.3 mg/dL — AB (ref 8.9–10.3)
CO2: 20 mmol/L — AB (ref 22–32)
CREATININE: 0.36 mg/dL — AB (ref 0.44–1.00)
Chloride: 105 mmol/L (ref 101–111)
GFR calc non Af Amer: >60 mL/min (ref >60)
Glucose, Bld: 136 mg/dL — ABNORMAL HIGH (ref 65–99)
Potassium: 3.7 mmol/L (ref 3.5–5.1)
SODIUM: 135 mmol/L (ref 135–145)

## 2015-03-10 LAB — CBC
HEMATOCRIT: 46.2 % — AB (ref 36.0–46.0)
Hemoglobin: 15.7 g/dL — ABNORMAL HIGH (ref 12.0–15.0)
MCH: 29.4 pg (ref 26.0–34.0)
MCHC: 34 g/dL (ref 30.0–36.0)
MCV: 86.5 fL (ref 78.0–100.0)
Platelets: 113 10*3/uL — ABNORMAL LOW (ref 150–400)
RBC: 5.34 MIL/uL — ABNORMAL HIGH (ref 3.87–5.11)
RDW: 15.9 % — AB (ref 11.5–15.5)
WBC: 3.7 10*3/uL — ABNORMAL LOW (ref 4.0–10.5)

## 2015-03-10 MED ORDER — LIDOCAINE VISCOUS 2 % MT SOLN: 7.5000 mL | Freq: Four times a day (QID) | OROMUCOSAL | Status: DC

## 2015-03-10 MED ORDER — LIDOCAINE VISCOUS 2 % MT SOLN
7.5000 mL | Freq: Four times a day (QID) | OROMUCOSAL | Status: DC
  Administered 2015-03-10 – 2015-03-13 (×11): 7.5 mL via OROMUCOSAL
  Filled 2015-03-10 (×12): qty 15

## 2015-03-10 MED ORDER — DEXTROSE 5 % IV SOLN
5.0000 mg/kg | Freq: Three times a day (TID) | INTRAVENOUS | Status: DC
  Administered 2015-03-10 – 2015-03-12 (×5): 330 mg via INTRAVENOUS
  Filled 2015-03-10 (×9): qty 6.6

## 2015-03-10 MED ORDER — MAGIC MOUTHWASH: 7.5000 mL | Freq: Four times a day (QID) | ORAL | Status: DC

## 2015-03-10 MED ORDER — HYDROCORTISONE 5 MG PO TABS: 7.5000 mg | ORAL_TABLET | Freq: Four times a day (QID) | ORAL | Status: DC

## 2015-03-10 MED ORDER — ACYCLOVIR SODIUM 50 MG/ML IV SOLN
INTRAVENOUS | Status: AC
  Filled 2015-03-10: qty 10

## 2015-03-10 MED ORDER — MAGIC MOUTHWASH
7.5000 mL | Freq: Four times a day (QID) | ORAL | Status: DC
  Administered 2015-03-10 – 2015-03-13 (×11): 7.5 mL via ORAL
  Filled 2015-03-10 (×12): qty 10

## 2015-03-10 MED ORDER — HYDROCORTISONE 5 MG PO TABS
7.5000 mg | ORAL_TABLET | Freq: Four times a day (QID) | ORAL | Status: DC
  Administered 2015-03-10 – 2015-03-13 (×11): 7.5 mg via ORAL
  Filled 2015-03-10 (×17): qty 2

## 2015-03-10 NOTE — Progress Notes (Addendum)
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 Her oncologist is at [facility]. She underwent thyroid surgery at [facility]. Presented with shortness of breath, fever, and was noted to have pneumonia. Patient was hospitalized for further management. Her pneumonia improved. However, she did complain of odynophagia and dysphagia. Gastroenterology was consulted. She underwent EGD which showed severe esophagitis.  Past medical history:  Past Medical History  Diagnosis Date  . Essential hypertension   . Anxiety   . Depression   . COPD (chronic obstructive pulmonary disease) ([place])   . GERD (gastroesophageal reflux disease)   . Headache(784.0)   . Arthritis   . Hx of adenomatous colonic polyps   . Emphysema   . Cataract     1 lens implaqnt right eye,intact on left eye  . Goiter   . Fibromyalgia   . Pulmonary emboli ([place]) 11/04/11    Right upper lobe and r lower lobe PE  . Lung nodule seen on imaging study 11/04/11 CT    22mm LLL  . DVT (deep venous thrombosis) ([place]) 12/09/2011  . Pulmonary embolism ([place]) 12/09/2011  . Hyperlipidemia   . History of radiation therapy 10/06/11    SRS 15Gy 8fx, brain  . UTI (lower urinary tract infection)   . Borderline diabetes mellitus   . Metastatic adenocarcinoma to brain ([place]) 09/15/11    Left frontoparietal region  . Thyroid cancer ([place]) 2015  . S/P radiation therapy 05/31/14 SRS    Brain  . PONV (postoperative nausea and vomiting)     Consultants: Gastroenterology  Procedures:  EGD 12/2 ENDOSCOPIC IMPRESSION: Severe mucositis/proximal esophagitis. Proximal esophageal stricture dilated with scope passage. Status post KOH brushing and biopsy. Barrett's esophagus present"involving the majority  of the tubular esophagus. Gastric antral ulcer scar indicative of prior peptic ulcer disease   Antibiotics: Patient was started on vancomycin and aztreonam 11/27 Patient was switched over to intravenous Levaquin 11/30  Subjective: Patient feels better this morning. Less pain with swallowing. Less difficulty with swallowing. Breathing is back to baseline.  Objective: Vital Signs  Filed Vitals:   03/09/15 0520 03/09/15 1255 03/09/15 2110 03/10/15 1022  BP: 127/87 115/81 124/77 118/84  Pulse: 79 81 79   Temp: 98.5 F (36.9 C) 98.1 F (36.7 C) 98.1 F (36.7 C)   TempSrc: Oral Oral Oral   Resp: $Remo'18 18 18   'ZGmOF$ Height:      Weight:      SpO2: 92% 90% 90%     Intake/Output Summary (Last 24 hours) at 03/10/15 1101 Last data filed at 03/10/15 0700  Gross per 24 hour  Intake   1460 ml  Output      0 ml  Net   1460 ml   Filed Weights   03/03/15 0520  Weight: 66.225 kg (146 lb)    General appearance: alert, cooperative, and no distress Denuded mucosa in the oral cavity. Some improvement noted Resp: Clear to auscultation bilaterally. No wheezing, rales or rhonchi.  Cardio: regular rate and rhythm, S1, S2 normal, no murmur, click, rub or gallop GI: soft, non-tender; bowel sounds normal; no masses,  no organomegaly Neurologic: Alert. Cranial nerves II-12 intact. No obvious focal neurological deficits.  Lab Results:  Basic Metabolic Panel:  Recent Labs Lab 03/06/15 1059 03/07/15 0973 03/08/15 0603 03/09/15 0602 03/10/15 5329  NA 137 136 135 135 135  K 3.4* 3.3* 3.4* 3.7 3.7  CL 102 103 107 104 105  CO2 $Re'26 23 22 23 'rYA$ 20*  GLUCOSE 118* 97 123* 119* 136*  BUN $Re'14 16 14 12 10  'UaQ$ CREATININE 0.33* 0.34* <0.30* 0.32* 0.36*  CALCIUM 8.3* 8.4* 8.0* 8.3* 8.3*  MG  --   --  2.1 2.0  --    Liver Function Tests: No results for input(s): AST, ALT, ALKPHOS, BILITOT, PROT, ALBUMIN in the last 168 hours. CBC:  Recent Labs Lab 03/05/15 0658 03/06/15 1059 03/07/15 0722 03/08/15 0603  03/10/15 0856  WBC 1.9* 2.1* 2.8* 2.5* 3.7*  NEUTROABS 1.5* 1.8  --   --   --   HGB 13.6 14.4 15.2* 13.9 15.7*  HCT 41.6 43.1 45.1 41.8 46.2*  MCV 88.1 87.2 86.7 86.9 86.5  PLT 72* 78* 86* 105* 113*   Cardiac Enzymes: No results for input(s): CKTOTAL, CKMB, CKMBINDEX, TROPONINI in the last 168 hours.   Recent Results (from the past 240 hour(s))  Urine culture     Status: None   Collection Time: 03/02/15 10:55 PM  Result Value Ref Range Status   Specimen Description URINE, CLEAN CATCH  Final   Special Requests NONE  Final   Culture   Final    MULTIPLE SPECIES PRESENT, SUGGEST RECOLLECTION Performed at [facility]    Report Status 03/04/2015 FINAL  Final  Culture, blood (routine x 2)     Status: None   Collection Time: 03/02/15 11:59 PM  Result Value Ref Range Status   Specimen Description BLOOD RIGHT HAND  Final   Special Requests BOTTLES DRAWN AEROBIC AND ANAEROBIC 6CC  Final   Culture NO GROWTH 5 DAYS  Final   Report Status 03/08/2015 FINAL  Final  Culture, blood (routine x 2)     Status: None   Collection Time: 03/03/15 12:09 AM  Result Value Ref Range Status   Specimen Description BLOOD LEFT HAND  Final   Special Requests BOTTLES DRAWN AEROBIC AND ANAEROBIC 6CC  Final   Culture NO GROWTH 5 DAYS  Final   Report Status 03/08/2015 FINAL  Final  Culture, expectorated sputum-assessment     Status: None   Collection Time: 03/04/15 11:05 PM  Result Value Ref Range Status   Specimen Description SPUTUM EXPECTORATED  Final   Special Requests IMMUNE:COMPROMISED  Final   Sputum evaluation   Final    THIS SPECIMEN IS ACCEPTABLE. RESPIRATORY CULTURE REPORT TO FOLLOW. Performed at [facility]    Report Status 03/05/2015 FINAL  Final  Fungus Culture with Smear     Status: None (Preliminary result)   Collection Time: 03/04/15 11:05 PM  Result Value Ref Range Status   Specimen Description SPUTUM EXPECTORATED  Final   Special Requests IMMUNE:COMPROMISED  Final    Fungal Smear   Final    NO YEAST OR FUNGAL ELEMENTS SEEN Performed at [facility]    Culture   Final    CULTURE IN PROGRESS FOR FOUR WEEKS Performed at [facility]    Report Status PENDING  Incomplete  Culture, respiratory (NON-Expectorated)     Status: None (Preliminary result)   Collection Time: 03/04/15 11:05 PM  Result Value Ref Range Status   Specimen Description SPUTUM EXPECTORATED  Final   Special Requests IMMUNE:COMPROMISED  Final   Gram Stain   Final    ABUNDANT WBC PRESENT, PREDOMINANTLY PMN FEW SQUAMOUS EPITHELIAL CELLS PRESENT ABUNDANT GRAM POSITIVE COCCI IN PAIRS IN  CHAINS IN CLUSTERS RARE GRAM NEGATIVE RODS Performed at [facility]    Culture   Final    Culture reincubated for better growth Performed at [facility]    Report Status PENDING  Incomplete  Surgical pcr screen     Status: None   Collection Time: 03/07/15  6:16 AM  Result Value Ref Range Status   MRSA, PCR NEGATIVE NEGATIVE Final   Staphylococcus aureus NEGATIVE NEGATIVE Final    Comment:        The Xpert SA Assay (FDA approved for NASAL specimens in patients over 62 years of age), is one component of a comprehensive surveillance program.  Test performance has been validated by [facility] for patients greater than or equal to 24 year old. It is not intended to diagnose infection nor to guide or monitor treatment.   KOH prep     Status: None   Collection Time: 03/07/15 12:37 PM  Result Value Ref Range Status   Specimen Description ESOPHAGUS  Final   Special Requests NONE  Final   KOH Prep FEW YEAST  Final   Report Status 03/07/2015 FINAL  Final      Studies/Results: No results found.  Medications:  Scheduled: . amitriptyline  25 mg Oral QHS  . amLODipine  5 mg Oral Daily  . dexamethasone  2 mg Intravenous Q24H  . feeding supplement (ENSURE ENLIVE)  237 mL Oral TID WC  . fluconazole (DIFLUCAN) IV  100 mg Intravenous Q24H  . home med stored in  pyxis  1 each Oral BID  . levofloxacin (LEVAQUIN) IV  750 mg Intravenous Q24H  . levothyroxine  137 mcg Oral QAC breakfast  . magic mouthwash w/lidocaine  15 mL Oral QID  . magnesium oxide  400 mg Oral Daily  . pantoprazole (PROTONIX) IV  40 mg Intravenous Q12H  . polyethylene glycol  17 g Oral QODAY  . sodium chloride  3 mL Intravenous Q12H  . sucralfate  1 g Oral TID WC & HS   Continuous: . sodium chloride 50 mL/hr at 03/09/15 1943   DXI:[id number] **OR** acetaminophen, ALPRAZolam, alum & mag hydroxide-simeth, HYDROmorphone (DILAUDID) injection, ondansetron **OR** ondansetron (ZOFRAN) IV  Assessment/Plan:  Active Problems:   Metastatic adenocarcinoma to brain, thyroid cancer   Sepsis ([place])   HCAP (healthcare-associated pneumonia)   Mucositis due to radiation therapy   COPD (chronic obstructive pulmonary disease) (H[facility])   Pneumonia   Hyponatremia   Hypokalemia   Leukopenia   Thrombocytopenia (HCC)   Hypothyroid   Protein-calorie malnutrition, severe   Dysphagia   Odynophagia   Barrett's esophagus with dysplasia   Esophageal reflux   Metastatic cancer (HCC)   Mucosal abnormality of esophagus   Esophagitis     Odynophagia/Dysphagia/esophagitis/esophageal stricture/esophageal candidiasis Patient underwent EGD 12/2. This revealed severe esophagitis with stricture. Evidence for Barrett's esophagus was also noted. Gastroenterology is following. KOH was positive suggesting yeast. Continue Diflucan. Patient continues to improve slowly. She is tolerating full liquids. Defer further advancement to gastroenterology for now. Even though she seems to be turning the corner, use of feeding tube for nutritional purposes has not been ruled out completely. Although I think it could be avoided now. Patient also seen by nutritionist. She is likely malnourished.   Healthcare associated pneumonia Patient has significantly improved. Patient met sepsis criteria at the time of admission with  fever, tachycardia and tachypnea. She was started on broad-spectrum antibiotics. Cultures are all negative. Sepsis physiology resolved. Patient was changed over to [place]. Oxygen  was also titrated down. Saturating well on just 2 L by nasal cannula. Leave her on intravenous antibiotics till she is consistently able to take orally. Continue antibiotics for 10 days total.  Mucositis Secondary to radiation plus possible oropharyngeal candida. Continue lidocaine, magic mouthwash. Also on Diflucan.  Metastatic thyroid cancer with metastases to the brain Continue Decadron. Holding her Lenvatinib for now. Is known to cause GI perforation. She has recently completed radiation treatment. Followed by oncology at [facility]. Discussed with her oncologist Christene Slates, M.D.) today. She advises to continue to hold her Lenvatinib for now as this medication can also cause mucositis. Could be resumed at the time of discharge. She will make a follow-up appointment for this patient next week.  Protein-calorie malnutrition, severe As above.  History of COPD Stable.  Pancytopenia Cell counts have improved. Continue to monitor for now.  History of hypothyroidism Continue Synthroid.  DVT Prophylaxis: SCDs    Code Status: Full code  Family Communication: Discussed with the patient  Disposition Plan: Continue treatment as outlined above. Continue to mobilize.     LOS: 7 days   [place] [facility] Pager [phone] 03/10/2015, 11:01 AM  If 7PM-7AM, please contact night-coverage at [email or web address], password [facility]

## 2015-03-10 NOTE — Progress Notes (Signed)
    Subjective: Dysphagia/odynophagia improved. Tolerated 1/2 oatmeal and 1/2 grits this morning.   Objective: Vital signs in last 24 hours: Temp:  [98.1 F (36.7 C)] 98.1 F (36.7 C) (12/04 2110) Pulse Rate:  [79-81] 79 (12/04 2110) Resp:  [18] 18 (12/04 2110) BP: (115-124)/(77-81) 124/77 mmHg (12/04 2110) SpO2:  [90 %] 90 % (12/04 2110) Last BM Date: 03/07/15 General:   Alert and oriented, pleasant Head:  Normocephalic and atraumatic. Eyes:  No icterus, sclera clear. Conjuctiva pink.  Abdomen:  Bowel sounds present, soft, non-tender, non-distended. No HSM or hernias noted. No rebound or guarding. No masses appreciated  Msk:  Symmetrical without gross deformities. Normal posture. Neurologic:  Alert and  oriented x4 Psych:  Alert and cooperative. Normal mood and affect.  Intake/Output from previous day: 12/04 0701 - 12/05 0700 In: 4734 [P.O.:480; I.V.:650; IV Piggyback:600] Out: -  Intake/Output this shift:    Lab Results:  Recent Labs  03/08/15 0603  WBC 2.5*  HGB 13.9  HCT 41.8  PLT 105*   BMET  Recent Labs  03/08/15 0603 03/09/15 0602  NA 135 135  K 3.4* 3.7  CL 107 104  CO2 22 23  GLUCOSE 123* 119*  BUN 14 12  CREATININE <0.30* 0.32*  CALCIUM 8.0* 8.3*    Assessment: 59 year old female with mucositis, candida esophagitis in setting of radiation, stricture on EGD dilated with scope passage, final path report still pending but likely pill-induced injury. Viral stains pending. Clinically improved since admission. Will hold on enteral supplemental feedings for now as she is tolerating a soft diet. Avoid large pills, oral potassium.   Plan: Continue Diflucan X 21 days Continue magic mouthwash Protonix BID Carafate QID Will attempt advancing to a soft diet Will follow-up on final path reports when available Avoid large pills, potassium tablets. Crush pills and mix with applesauce or other agent    Orvil Feil, ANP-BC [facility]      LOS: 7 days    03/10/2015, 8:37 AM  Patient seen and examined this evening. Is swallowing notably better. Dr. 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 I discussed the case with Seth Bake at the [facility]. We'll need to add IV acyclovir with a total treatment duration of 14-21 days. Hopefully, can switch over to by mouth formulation in the next couple of days.  It will be dosed by pharmacy.

## 2015-03-10 NOTE — Progress Notes (Signed)
PT Cancellation Note  Patient Details Name: Haley Lowe MRN: [id number] DOB: 1955/07/04   Cancelled Treatment:    Reason Eval/Treat Not Completed: Other (comment).  Pt is ambulating independently with husband, assist of IV pole.  Stability is WNL.   Sable Feil  PT 03/10/2015, 9:38 AM [phone]

## 2015-03-11 DIAGNOSIS — B0089 Other herpesviral infection: Secondary | ICD-10-CM | POA: Diagnosis present

## 2015-03-11 DIAGNOSIS — K208 Other esophagitis without bleeding: Secondary | ICD-10-CM | POA: Diagnosis present

## 2015-03-11 HISTORY — DX: Other herpesviral infection: B00.89

## 2015-03-11 HISTORY — DX: Other esophagitis without bleeding: K20.80

## 2015-03-11 LAB — CULTURE, RESPIRATORY

## 2015-03-11 LAB — BASIC METABOLIC PANEL
ANION GAP: 4 — AB (ref 5–15)
BUN: 10 mg/dL (ref 6–20)
CALCIUM: 8.1 mg/dL — AB (ref 8.9–10.3)
CO2: 26 mmol/L (ref 22–32)
Chloride: 105 mmol/L (ref 101–111)
Creatinine, Ser: 0.37 mg/dL — ABNORMAL LOW (ref 0.44–1.00)
Glucose, Bld: 108 mg/dL — ABNORMAL HIGH (ref 65–99)
Potassium: 3.2 mmol/L — ABNORMAL LOW (ref 3.5–5.1)
Sodium: 135 mmol/L (ref 135–145)

## 2015-03-11 LAB — CULTURE, RESPIRATORY W GRAM STAIN: Culture: NORMAL

## 2015-03-11 MED ORDER — POTASSIUM CHLORIDE 10 MEQ/100ML IV SOLN
10.0000 meq | INTRAVENOUS | Status: AC
  Administered 2015-03-11 (×2): 10 meq via INTRAVENOUS
  Filled 2015-03-11 (×2): qty 100

## 2015-03-11 MED ORDER — DEXAMETHASONE 0.5 MG PO TABS
1.0000 mg | ORAL_TABLET | Freq: Every day | ORAL | Status: DC
  Administered 2015-03-11 – 2015-03-13 (×3): 1 mg via ORAL
  Filled 2015-03-11 (×4): qty 2

## 2015-03-11 MED ORDER — POTASSIUM CHLORIDE 10 MEQ/100ML IV SOLN
10.0000 meq | INTRAVENOUS | Status: AC
  Administered 2015-03-11 (×3): 10 meq via INTRAVENOUS
  Filled 2015-03-11: qty 100

## 2015-03-11 MED ORDER — LEVOFLOXACIN 750 MG PO TABS
750.0000 mg | ORAL_TABLET | Freq: Every day | ORAL | Status: DC
  Administered 2015-03-12: 750 mg via ORAL
  Filled 2015-03-11: qty 1

## 2015-03-11 NOTE — Care Management Note (Signed)
Case Management Note  Patient Details  Name: Haley Lowe MRN: [id number] Date of Birth: 1955/07/11  Subjective/Objective:                    Action/Plan:   Expected Discharge Date:                  Expected Discharge Plan:  Home/Self Care  In-House Referral:  NA  Discharge planning Services  CM Consult  Post Acute Care Choice:  NA Choice offered to:  NA  DME Arranged:    DME Agency:     HH Arranged:    HH Agency:     Status of Service:  In process, will continue to follow  Medicare Important Message Given:  Yes Date Medicare IM Given:    Medicare IM give by:    Date Additional Medicare IM Given:    Additional Medicare Important Message give by:     If discussed at [place] of Stay Meetings, dates discussed:  03/11/2015  Additional Comments:  Sherald Barge, RN 03/11/2015, 2:52 PM

## 2015-03-11 NOTE — Progress Notes (Signed)
    Subjective: Eating french toast and oatmeal. Odynophagia/dysphagia much improved. No abdominal pain, N/V.   Objective: Vital signs in last 24 hours: Temp:  [98 F (36.7 C)-98.9 F (37.2 C)] 98.8 F (37.1 C) (12/06 0247) Pulse Rate:  [76-107] 82 (12/06 0247) Resp:  [18] 18 (12/05 2121) BP: (100-118)/(67-84) 110/75 mmHg (12/06 0247) SpO2:  [93 %-94 %] 94 % (12/06 0247) Last BM Date: 03/07/15 General:   Alert and oriented, pleasant Head:  Normocephalic and atraumatic. Abdomen:  Bowel sounds present, soft, non-tender, non-distended. No HSM or hernias noted. No rebound or guarding. No masses appreciated  Neurologic:  Alert and  oriented x4;  grossly normal neurologically. Psych:  Alert and cooperative. Normal mood and affect.  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  Clinically improved since admission. Will hold on enteral supplemental feedings for now as she is tolerating a soft diet. Avoid large pills, oral potassium. Will need IV acyclovir for treatment of HSV, total of 14-21 days. May transition to oral (both acyclovir and Diflucan for candida) once tolerating diet. Pharmacy to dose acyclovir  Plan: Diflucan X 21 days Acyclovir for 14-21 days Protonix BID Carafate QID Avoid large pills, crush as tolerated and mix with applesauce or other agent Continue soft diet and transition to oral diflucan and acyclovir if tolerates breakfast Hopeful discharge soon  Orvil Feil,  ANP-BC [facility]     LOS: 8 days    03/11/2015, 7:56 AM

## 2015-03-11 NOTE — Progress Notes (Signed)
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 Her oncologist is at [facility]. She underwent thyroid surgery at [facility]. Presented with shortness of breath, fever, and was noted to have pneumonia. Patient was hospitalized for further management. Her pneumonia improved. However, she did complain of odynophagia and dysphagia. Gastroenterology was consulted. She underwent EGD which showed severe esophagitis. There was some evidence for candidiasis. Biopsy results also suggested HSV.  Past medical history:  Past Medical History  Diagnosis Date  . Essential hypertension   . Anxiety   . Depression   . COPD (chronic obstructive pulmonary disease) ([place])   . GERD (gastroesophageal reflux disease)   . Headache(784.0)   . Arthritis   . Hx of adenomatous colonic polyps   . Emphysema   . Cataract     1 lens implaqnt right eye,intact on left eye  . Goiter   . Fibromyalgia   . Pulmonary emboli ([place]) 11/04/11    Right upper lobe and r lower lobe PE  . Lung nodule seen on imaging study 11/04/11 CT    12mm LLL  . DVT (deep venous thrombosis) ([place]) 12/09/2011  . Pulmonary embolism ([place]) 12/09/2011  . Hyperlipidemia   . History of radiation therapy 10/06/11    SRS 15Gy 24fx, brain  . UTI (lower urinary tract infection)   . Borderline diabetes mellitus   . Metastatic adenocarcinoma to brain ([place]) 09/15/11    Left frontoparietal region  . Thyroid cancer ([place]) 2015  . S/P radiation therapy 05/31/14 SRS    Brain  . PONV (postoperative nausea and vomiting)     Consultants: Gastroenterology  Procedures:  EGD 12/2 ENDOSCOPIC IMPRESSION: Severe mucositis/proximal esophagitis. Proximal esophageal stricture dilated with scope passage. Status post  KOH brushing and biopsy. Barrett's esophagus present"involving the majority of the tubular esophagus. Gastric antral ulcer scar indicative of prior peptic ulcer disease   Antibiotics: Patient was started on vancomycin and aztreonam 11/27 Patient was switched over to intravenous Levaquin 11/30 Patient also remains on Diflucan Started on acyclovir 12/5  Subjective: Vision continues to feel better. Tolerating her soft diet. Less pain with swallowing.   Objective: Vital Signs  Filed Vitals:   03/10/15 1427 03/10/15 2121 03/10/15 2148 03/11/15 0247  BP: 100/69 116/74 114/67 110/75  Pulse: 107 76 87 82  Temp: 98 F (36.7 C) 98.4 F (36.9 C) 98.9 F (37.2 C) 98.8 F (37.1 C)  TempSrc: Oral Oral Oral Oral  Resp: 18 18    Height:      Weight:      SpO2: 93% 94% 94% 94%    Intake/Output Summary (Last 24 hours) at 03/11/15 0903 Last data filed at 03/11/15 0800  Gross per 24 hour  Intake    720 ml  Output      0 ml  Net    720 ml   Filed Weights   03/03/15 0520  Weight: 66.225 kg (146 lb)    General appearance: alert, cooperative, and no distress Denuded mucosa in the oral cavity. Some improvement noted Resp: Clear to auscultation bilaterally. No wheezing, rales or rhonchi.  Cardio: regular rate and rhythm, S1, S2 normal, no murmur, click, rub or gallop GI: soft, non-tender; bowel sounds normal; no masses,  no organomegaly Neurologic: Alert. Cranial nerves II-12 intact. No obvious focal neurological deficits.  Lab Results:  Basic Metabolic Panel:  Recent Labs Lab 03/07/15 0722 03/08/15 0603 03/09/15 0602 03/10/15 0856 03/11/15 0740  NA 136 135 135 135 135  K 3.3* 3.4* 3.7 3.7 3.2*  CL 103 107 104 105 105  CO2 $Re'23 22 23 'RVB$ 20* 26  GLUCOSE 97 123* 119* 136* 108*  BUN $Re'16 14 12 10 10  'MPW$ CREATININE 0.34* <0.30* 0.32* 0.36* 0.37*  CALCIUM 8.4* 8.0* 8.3* 8.3* 8.1*  MG  --  2.1 2.0  --   --    Liver Function Tests: No results for input(s): AST, ALT, ALKPHOS, BILITOT,  PROT, ALBUMIN in the last 168 hours. CBC:  Recent Labs Lab 03/05/15 0658 03/06/15 1059 03/07/15 0722 03/08/15 0603 03/10/15 0856  WBC 1.9* 2.1* 2.8* 2.5* 3.7*  NEUTROABS 1.5* 1.8  --   --   --   HGB 13.6 14.4 15.2* 13.9 15.7*  HCT 41.6 43.1 45.1 41.8 46.2*  MCV 88.1 87.2 86.7 86.9 86.5  PLT 72* 78* 86* 105* 113*   Cardiac Enzymes: No results for input(s): CKTOTAL, CKMB, CKMBINDEX, TROPONINI in the last 168 hours.   Recent Results (from the past 240 hour(s))  Urine culture     Status: None   Collection Time: 03/02/15 10:55 PM  Result Value Ref Range Status   Specimen Description URINE, CLEAN CATCH  Final   Special Requests NONE  Final   Culture   Final    MULTIPLE SPECIES PRESENT, SUGGEST RECOLLECTION Performed at [facility]    Report Status 03/04/2015 FINAL  Final  Culture, blood (routine x 2)     Status: None   Collection Time: 03/02/15 11:59 PM  Result Value Ref Range Status   Specimen Description BLOOD RIGHT HAND  Final   Special Requests BOTTLES DRAWN AEROBIC AND ANAEROBIC 6CC  Final   Culture NO GROWTH 5 DAYS  Final   Report Status 03/08/2015 FINAL  Final  Culture, blood (routine x 2)     Status: None   Collection Time: 03/03/15 12:09 AM  Result Value Ref Range Status   Specimen Description BLOOD LEFT HAND  Final   Special Requests BOTTLES DRAWN AEROBIC AND ANAEROBIC 6CC  Final   Culture NO GROWTH 5 DAYS  Final   Report Status 03/08/2015 FINAL  Final  Culture, expectorated sputum-assessment     Status: None   Collection Time: 03/04/15 11:05 PM  Result Value Ref Range Status   Specimen Description SPUTUM EXPECTORATED  Final   Special Requests IMMUNE:COMPROMISED  Final   Sputum evaluation   Final    THIS SPECIMEN IS ACCEPTABLE. RESPIRATORY CULTURE REPORT TO FOLLOW. Performed at [facility]    Report Status 03/05/2015 FINAL  Final  Fungus Culture with Smear     Status: None (Preliminary result)   Collection Time: 03/04/15 11:05 PM  Result  Value Ref Range Status   Specimen Description SPUTUM EXPECTORATED  Final   Special Requests IMMUNE:COMPROMISED  Final   Fungal Smear   Final    NO YEAST OR FUNGAL ELEMENTS SEEN Performed at [facility]    Culture   Final    CULTURE IN PROGRESS FOR FOUR WEEKS Performed at [facility]    Report Status PENDING  Incomplete  Culture, respiratory (NON-Expectorated)     Status: None   Collection Time: 03/04/15 11:05 PM  Result Value Ref Range Status   Specimen Description SPUTUM EXPECTORATED  Final   Special Requests IMMUNE:COMPROMISED  Final   Gram Stain   Final  ABUNDANT WBC PRESENT, PREDOMINANTLY PMN FEW SQUAMOUS EPITHELIAL CELLS PRESENT ABUNDANT GRAM POSITIVE COCCI IN PAIRS IN CHAINS IN CLUSTERS RARE GRAM NEGATIVE RODS Performed at [facility]    Culture   Final    NORMAL OROPHARYNGEAL FLORA Performed at [facility]    Report Status 03/11/2015 FINAL  Final  Surgical pcr screen     Status: None   Collection Time: 03/07/15  6:16 AM  Result Value Ref Range Status   MRSA, PCR NEGATIVE NEGATIVE Final   Staphylococcus aureus NEGATIVE NEGATIVE Final    Comment:        The Xpert SA Assay (FDA approved for NASAL specimens in patients over 37 years of age), is one component of a comprehensive surveillance program.  Test performance has been validated by [facility] for patients greater than or equal to 39 year old. It is not intended to diagnose infection nor to guide or monitor treatment.   KOH prep     Status: None   Collection Time: 03/07/15 12:37 PM  Result Value Ref Range Status   Specimen Description ESOPHAGUS  Final   Special Requests NONE  Final   KOH Prep FEW YEAST  Final   Report Status 03/07/2015 FINAL  Final      Studies/Results: No results found.  Medications:  Scheduled: . acyclovir  5 mg/kg Intravenous Q8H  . amitriptyline  25 mg Oral QHS  . amLODipine  5 mg Oral Daily  . dexamethasone  2 mg Intravenous Q24H  .  feeding supplement (ENSURE ENLIVE)  237 mL Oral TID WC  . fluconazole (DIFLUCAN) IV  100 mg Intravenous Q24H  . home med stored in pyxis  1 each Oral BID  . magic mouthwash  7.5 mL Oral QID   And  . lidocaine  7.5 mL Mouth/Throat QID   And  . hydrocortisone  7.5 mg Oral QID  . [START ON 03/12/2015] levofloxacin  750 mg Oral Daily  . levothyroxine  137 mcg Oral QAC breakfast  . magnesium oxide  400 mg Oral Daily  . pantoprazole (PROTONIX) IV  40 mg Intravenous Q12H  . polyethylene glycol  17 g Oral QODAY  . sodium chloride  3 mL Intravenous Q12H  . sucralfate  1 g Oral TID WC & HS   Continuous: . sodium chloride 1 mL (03/10/15 1745)   HBZ:[id number] **OR** acetaminophen, ALPRAZolam, alum & mag hydroxide-simeth, HYDROmorphone (DILAUDID) injection, ondansetron **OR** ondansetron (ZOFRAN) IV  Assessment/Plan:  Active Problems:   Metastatic adenocarcinoma to brain, thyroid cancer   Sepsis ([place])   HCAP (healthcare-associated pneumonia)   Mucositis due to radiation therapy   COPD (chronic obstructive pulmonary disease) (H[facility])   Pneumonia   Hyponatremia   Hypokalemia   Leukopenia   Thrombocytopenia ([facility])   Hypothyroid   Protein-calorie malnutrition, severe   Dysphagia   Odynophagia   Barrett's esophagus with dysplasia   Esophageal reflux   Metastatic cancer ([facility])   Mucosal abnormality of esophagus   Esophagitis   Mucositis oral     Odynophagia/Dysphagia/esophageal stricture/esophageal candidiasis/herpes esophagitis Patient underwent EGD 12/2. This revealed severe esophagitis with stricture. Evidence for Barrett's esophagus was also noted. Gastroenterology is following. KOH was positive suggesting yeast. The pathology report shows evidence for HSV. Continue Diflucan. Acyclovir was added yesterday. Anticipate we will be able to change these to oral route within the next 24 hours. She's tolerating soft diet. Patient is also on magic mouthwash as well as Carafate. Patient is  definitely improving and I think we  will be able to avoid a feeding tube. Patient also seen by nutritionist. She is likely malnourished.   Healthcare associated pneumonia Patient has significantly improved. Patient met sepsis criteria at the time of admission with fever, tachycardia and tachypnea. She was started on broad-spectrum antibiotics. Cultures are all negative. Sepsis physiology resolved. Patient was changed over to [place]. Oxygen was also titrated down. Saturating well on 2 L by nasal cannula. Changed to oral Levaquin. Will need 10 days of treatment. Check room air saturations.   Mucositis Improving. Secondary to radiation plus possible oropharyngeal candida. Apparently her chemotherapeutic agent can also cause mucositis. Continue lidocaine, magic mouthwash.   Metastatic thyroid cancer with metastases to the brain Continue Decadron but cut down the dose. Holding her Lenvatinib for now. This medication is known to cause GI perforation. She has recently completed radiation treatment. Followed by oncology at [facility]. Discussed with her oncologist Christene Slates, M.D.) 12/5. She advises to continue to hold her Lenvatinib for now as this medication can also cause mucositis. Could be resumed at the time of discharge. She will make a follow-up appointment for this patient next week.  Protein-calorie malnutrition, severe As above.  History of COPD Stable.  Pancytopenia Cell counts have improved. Continue to monitor for now.  History of hypothyroidism Continue Synthroid.  Hypokalemia  Potassium will be repleted.  DVT Prophylaxis: SCDs    Code Status: Full code  Family Communication: Discussed with the patient  Disposition Plan: Continue treatment as outlined above. Continue to mobilize. Anticipate discharge in 1-2 days. Anticipate change over to oral medications, either later today or tomorrow.     LOS: 8 days   [facility] Pager  [phone] 03/11/2015, 9:03 AM  If 7PM-7AM, please contact night-coverage at [email or web address], password [facility]

## 2015-03-12 ENCOUNTER — Encounter (HOSPITAL_COMMUNITY): Payer: Self-pay | Admitting: Internal Medicine

## 2015-03-12 DIAGNOSIS — K219 Gastro-esophageal reflux disease without esophagitis: Secondary | ICD-10-CM

## 2015-03-12 DIAGNOSIS — B0089 Other herpesviral infection: Secondary | ICD-10-CM

## 2015-03-12 LAB — BASIC METABOLIC PANEL
ANION GAP: 10 (ref 5–15)
BUN: 8 mg/dL (ref 6–20)
CO2: 24 mmol/L (ref 22–32)
Calcium: 8.4 mg/dL — ABNORMAL LOW (ref 8.9–10.3)
Chloride: 103 mmol/L (ref 101–111)
Creatinine, Ser: 0.39 mg/dL — ABNORMAL LOW (ref 0.44–1.00)
GLUCOSE: 102 mg/dL — AB (ref 65–99)
POTASSIUM: 3.6 mmol/L (ref 3.5–5.1)
Sodium: 137 mmol/L (ref 135–145)

## 2015-03-12 MED ORDER — FLUCONAZOLE 100 MG PO TABS
100.0000 mg | ORAL_TABLET | Freq: Every day | ORAL | Status: DC
  Administered 2015-03-13: 100 mg via ORAL
  Filled 2015-03-12: qty 1

## 2015-03-12 MED ORDER — ACYCLOVIR 800 MG PO TABS
400.0000 mg | ORAL_TABLET | Freq: Three times a day (TID) | ORAL | Status: DC
  Filled 2015-03-12 (×7): qty 1

## 2015-03-12 MED ORDER — ACYCLOVIR 200 MG PO CAPS
400.0000 mg | ORAL_CAPSULE | Freq: Three times a day (TID) | ORAL | Status: DC
  Administered 2015-03-12 – 2015-03-13 (×4): 400 mg via ORAL
  Filled 2015-03-12 (×4): qty 2

## 2015-03-12 NOTE — Progress Notes (Addendum)
   03/12/15 0253  What Happened  Was fall witnessed? Yes  Who witnessed fall? (rn)  Patients activity before fall bathroom-assisted  Point of contact buttocks  Was patient injured? No  Follow Up  MD notified (yes, dr. Shanon Brow)  Time MD notified [phone]  Family notified No- patient refusal (pt. alert and oriented x 4, will notify husband in am)  Additional tests No  Progress note created (see row info) Yes  Vitals  BP (!) 147/91 mmHg  Pulse Rate 91  Resp 18  Patient was assisted walking to bathroom with nurse.  Patient ambulated to bathroom with assistance.  Patient was supervised by staff while in restroom.  Patient stated she was done using toilet, staff attempted to help patient stand off toilet, patient's legs were weak, and patient assisted to floor. Patient had on yellow arm band, socks, and bed alarm, and is not impulsive.   Head was not hit, or any injuries sustained.  Patient had no c/o pain. Patient alert and oriented x 4, just disappointed she was not able to walk back to bed.  Will notify husband in am.  Will continue to monitor patient.   0650 [name] notified of patient assist to floor.  Patient husband verbalized understanding. Stated patient has beene having problems with her knees for a while.

## 2015-03-12 NOTE — Progress Notes (Signed)
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 Her oncologist is at [facility]. She underwent thyroid surgery at [facility]. Presented with shortness of breath, fever, and was noted to have pneumonia. Patient was hospitalized for further management. Her pneumonia improved. However, she did complain of odynophagia and dysphagia. Gastroenterology was consulted. She underwent EGD which showed severe esophagitis. There was some evidence for candidiasis. Biopsy results also suggested HSV.  Past medical history:  Past Medical History  Diagnosis Date  . Essential hypertension   . Anxiety   . Depression   . COPD (chronic obstructive pulmonary disease) ([place])   . GERD (gastroesophageal reflux disease)   . Headache(784.0)   . Arthritis   . Hx of adenomatous colonic polyps   . Emphysema   . Cataract     1 lens implaqnt right eye,intact on left eye  . Goiter   . Fibromyalgia   . Pulmonary emboli ([place]) 11/04/11    Right upper lobe and r lower lobe PE  . Lung nodule seen on imaging study 11/04/11 CT    6mm LLL  . DVT (deep venous thrombosis) ([place]) 12/09/2011  . Pulmonary embolism ([place]) 12/09/2011  . Hyperlipidemia   . History of radiation therapy 10/06/11    SRS 15Gy 16fx, brain  . UTI (lower urinary tract infection)   . Borderline diabetes mellitus   . Metastatic adenocarcinoma to brain ([place]) 09/15/11    Left frontoparietal region  . Thyroid cancer ([place]) 2015  . S/P radiation therapy 05/31/14 SRS    Brain  . PONV (postoperative nausea and vomiting)     Consultants: Gastroenterology  Procedures:  EGD 12/2 ENDOSCOPIC IMPRESSION: Severe mucositis/proximal esophagitis. Proximal esophageal stricture dilated with scope passage. Status post  KOH brushing and biopsy. Barrett's esophagus present"involving the majority of the tubular esophagus. Gastric antral ulcer scar indicative of prior peptic ulcer disease   Antibiotics: Patient was started on vancomycin and aztreonam 11/27 Patient was switched over to intravenous Levaquin 11/30 Patient also remains on Diflucan Started on acyclovir 12/5  Subjective: She has no new complaints and states she feels better.  Objective: Vital Signs  Filed Vitals:   03/11/15 2200 03/12/15 0253 03/12/15 0539 03/12/15 1641  BP: 120/68 147/91 112/79 132/71  Pulse: 80 91 82 81  Temp: 98.5 F (36.9 C)  97.9 F (36.6 C) 98.1 F (36.7 C)  TempSrc: Oral  Oral Oral  Resp: $Remo'18 18 18 18  'DcvYS$ Height:      Weight:      SpO2: 94%  93% 96%    Intake/Output Summary (Last 24 hours) at 03/12/15 1715 Last data filed at 03/12/15 1230  Gross per 24 hour  Intake    720 ml  Output    300 ml  Net    420 ml   Filed Weights   03/03/15 0520  Weight: 66.225 kg (146 lb)    General appearance: alert, cooperative, and in no acute distress Denuded mucosa in the oral cavity. Some improvement noted Resp: Clear to auscultation bilaterally. No wheezing, rales or rhonchi.  Cardio: regular rate and rhythm, S1, S2 normal, no murmur, click, rub or gallop GI: soft, non-tender; bowel sounds normal; no masses,  no organomegaly Neurologic: Alert. No facial asymmetry. No obvious focal neurological deficits.  Lab Results:  Basic Metabolic Panel:  Recent Labs Lab 03/08/15 0603 03/09/15 0602 03/10/15 0856 03/11/15 0740 03/12/15 0632  NA 135 135 135 135 137  K 3.4* 3.7 3.7 3.2* 3.6  CL 107 104 105 105 103  CO2 22 23 20* 26 24  GLUCOSE 123* 119* 136* 108* 102*  BUN $Re'14 12 10 10 8  'hAA$ CREATININE <0.30* 0.32* 0.36* 0.37* 0.39*  CALCIUM 8.0* 8.3* 8.3* 8.1* 8.4*  MG 2.1 2.0  --   --   --    Liver Function Tests: No results for input(s): AST, ALT, ALKPHOS, BILITOT, PROT, ALBUMIN in the last 168  hours. CBC:  Recent Labs Lab 03/06/15 1059 03/07/15 0722 03/08/15 0603 03/10/15 0856  WBC 2.1* 2.8* 2.5* 3.7*  NEUTROABS 1.8  --   --   --   HGB 14.4 15.2* 13.9 15.7*  HCT 43.1 45.1 41.8 46.2*  MCV 87.2 86.7 86.9 86.5  PLT 78* 86* 105* 113*   Cardiac Enzymes: No results for input(s): CKTOTAL, CKMB, CKMBINDEX, TROPONINI in the last 168 hours.   Recent Results (from the past 240 hour(s))  Urine culture     Status: None   Collection Time: 03/02/15 10:55 PM  Result Value Ref Range Status   Specimen Description URINE, CLEAN CATCH  Final   Special Requests NONE  Final   Culture   Final    MULTIPLE SPECIES PRESENT, SUGGEST RECOLLECTION Performed at [facility]    Report Status 03/04/2015 FINAL  Final  Culture, blood (routine x 2)     Status: None   Collection Time: 03/02/15 11:59 PM  Result Value Ref Range Status   Specimen Description BLOOD RIGHT HAND  Final   Special Requests BOTTLES DRAWN AEROBIC AND ANAEROBIC 6CC  Final   Culture NO GROWTH 5 DAYS  Final   Report Status 03/08/2015 FINAL  Final  Culture, blood (routine x 2)     Status: None   Collection Time: 03/03/15 12:09 AM  Result Value Ref Range Status   Specimen Description BLOOD LEFT HAND  Final   Special Requests BOTTLES DRAWN AEROBIC AND ANAEROBIC 6CC  Final   Culture NO GROWTH 5 DAYS  Final   Report Status 03/08/2015 FINAL  Final  Culture, expectorated sputum-assessment     Status: None   Collection Time: 03/04/15 11:05 PM  Result Value Ref Range Status   Specimen Description SPUTUM EXPECTORATED  Final   Special Requests IMMUNE:COMPROMISED  Final   Sputum evaluation   Final    THIS SPECIMEN IS ACCEPTABLE. RESPIRATORY CULTURE REPORT TO FOLLOW. Performed at [facility]    Report Status 03/05/2015 FINAL  Final  Fungus Culture with Smear     Status: None (Preliminary result)   Collection Time: 03/04/15 11:05 PM  Result Value Ref Range Status   Specimen Description SPUTUM EXPECTORATED  Final    Special Requests IMMUNE:COMPROMISED  Final   Fungal Smear   Final    NO YEAST OR FUNGAL ELEMENTS SEEN Performed at [facility]    Culture   Final    CULTURE IN PROGRESS FOR FOUR WEEKS Performed at [facility]    Report Status PENDING  Incomplete  Culture, respiratory (NON-Expectorated)     Status: None   Collection Time: 03/04/15 11:05 PM  Result Value Ref Range Status   Specimen Description SPUTUM EXPECTORATED  Final   Special Requests IMMUNE:COMPROMISED  Final   Gram Stain   Final    ABUNDANT WBC PRESENT, PREDOMINANTLY PMN FEW SQUAMOUS EPITHELIAL CELLS PRESENT ABUNDANT GRAM POSITIVE  COCCI IN PAIRS IN CHAINS IN CLUSTERS RARE GRAM NEGATIVE RODS Performed at [facility]    Culture   Final    NORMAL OROPHARYNGEAL FLORA Performed at [facility]    Report Status 03/11/2015 FINAL  Final  Surgical pcr screen     Status: None   Collection Time: 03/07/15  6:16 AM  Result Value Ref Range Status   MRSA, PCR NEGATIVE NEGATIVE Final   Staphylococcus aureus NEGATIVE NEGATIVE Final    Comment:        The Xpert SA Assay (FDA approved for NASAL specimens in patients over 85 years of age), is one component of a comprehensive surveillance program.  Test performance has been validated by [facility] for patients greater than or equal to 59 year old. It is not intended to diagnose infection nor to guide or monitor treatment.   KOH prep     Status: None   Collection Time: 03/07/15 12:37 PM  Result Value Ref Range Status   Specimen Description ESOPHAGUS  Final   Special Requests NONE  Final   KOH Prep FEW YEAST  Final   Report Status 03/07/2015 FINAL  Final      Studies/Results: No results found.  Medications:  Scheduled: . acyclovir  400 mg Oral TID  . amitriptyline  25 mg Oral QHS  . amLODipine  5 mg Oral Daily  . dexamethasone  1 mg Oral Daily  . feeding supplement (ENSURE ENLIVE)  237 mL Oral TID WC  . [START ON 03/13/2015]  fluconazole  100 mg Oral Daily  . home med stored in pyxis  1 each Oral BID  . magic mouthwash  7.5 mL Oral QID   And  . lidocaine  7.5 mL Mouth/Throat QID   And  . hydrocortisone  7.5 mg Oral QID  . levofloxacin  750 mg Oral Daily  . levothyroxine  137 mcg Oral QAC breakfast  . magnesium oxide  400 mg Oral Daily  . pantoprazole (PROTONIX) IV  40 mg Intravenous Q12H  . polyethylene glycol  17 g Oral QODAY  . sodium chloride  3 mL Intravenous Q12H  . sucralfate  1 g Oral TID WC & HS   Continuous: . sodium chloride 50 mL/hr at 03/11/15 1803   HFW:YOVZCHYIFOYDX **OR** acetaminophen, ALPRAZolam, alum & mag hydroxide-simeth, HYDROmorphone (DILAUDID) injection, ondansetron **OR** ondansetron (ZOFRAN) IV  Assessment/Plan:  Active Problems:   Metastatic adenocarcinoma to brain, thyroid cancer   Sepsis ([place])   HCAP (healthcare-associated pneumonia)   Mucositis due to radiation therapy   COPD (chronic obstructive pulmonary disease) (HCC)   Pneumonia   Hyponatremia   Hypokalemia   Leukopenia   Thrombocytopenia (HCC)   Hypothyroid   Protein-calorie malnutrition, severe   Dysphagia   Odynophagia   Barrett's esophagus with dysplasia   Esophageal reflux   Metastatic cancer (HCC)   Mucosal abnormality of esophagus   Esophagitis   Mucositis oral   Herpes simplex esophagitis     Odynophagia/Dysphagia/esophageal stricture/esophageal candidiasis/herpes esophagitis Patient underwent EGD 12/2. This revealed severe esophagitis with stricture. Evidence for Barrett's esophagus was also noted. Gastroenterology is following. KOH was positive suggesting yeast. The pathology report shows evidence for HSV. Continue Diflucan.  Acyclovir added and patient currently taking po. She's tolerating soft diet. Patient is also on magic mouthwash as well as Carafate. Patient is definitely improving and I think we will be able to avoid a feeding tube. Patient also seen by nutritionist. Is most likely  malnourished.   Healthcare associated  pneumonia Patient has significantly improved. Patient met sepsis criteria at the time of admission with fever, tachycardia and tachypnea. She was started on broad-spectrum antibiotics. Cultures are all negative. Sepsis physiology resolved. Patient was changed over to [place]. Oxygen was also titrated down. Saturating well on 2 L by nasal cannula. Changed to oral Levaquin.  - completed 11 days of high dose levaquin. Will discontinue  Mucositis Improving. Secondary to radiation plus possible oropharyngeal candida. Apparently her chemotherapeutic agent can also cause mucositis. Continue lidocaine, magic mouthwash.   Metastatic thyroid cancer with metastases to the brain Continue Decadron but cut down the dose. Holding her Lenvatinib for now. This medication is known to cause GI perforation. She has recently completed radiation treatment. Followed [facility] oncology at [facility]. Discussed with her oncologist Christene Slates, M.D.) 12/5. She advises to continue to hold her Lenvatinib for now as this medication can also cause mucositis. Could be resumed at the time of discharge. She will make a follow-up appointment for this patient next week.  Protein-calorie malnutrition, severe As above.  History of COPD Stable.  Pancytopenia Cell counts have improved. Continue to monitor for now.  History of hypothyroidism Continue Synthroid.  Hypokalemia  Potassium will be repleted.  DVT Prophylaxis: SCDs    Code Status: Full code  Family Communication: Discussed with the patient  Disposition Plan: Pending improvement in condition.     LOS: 9 days   Beckem Tomberlin, [facility] Pager [phone]  03/12/2015, 5:15 PM  If 7PM-7AM, please contact night-coverage at [email or web address], password [facility]

## 2015-03-12 NOTE — Care Management Note (Signed)
Case Management Note  Patient Details  Name: Haley Lowe MRN: [id number] Date of Birth: 1955-09-23  Pt agreeable to [facility] PT at DC. Pt has chosen [facility] for [facility] services. [place], made aware and will obtain pt info from chart. Will cont to update Care [place] on pt's DC.   Expected Discharge Date:                  Expected Discharge Plan:  [place]  In-House Referral:  NA  Discharge planning Services  CM Consult  Post Acute Care Choice:  Home Health Choice offered to:  Patient  DME Arranged:    DME Agency:     HH Arranged:  PT HH Agency:  [facility]  Status of Service:  In process, will continue to follow  Medicare Important Message Given:  Yes Date Medicare IM Given:    Medicare IM give by:    Date Additional Medicare IM Given:    Additional Medicare Important Message give by:     If discussed at [place] of Stay Meetings, dates discussed:    Additional Comments:  Sherald Barge, RN 03/12/2015, 9:11 AM

## 2015-03-12 NOTE — Progress Notes (Signed)
PT Cancellation Note  Patient Details Name: Haley Lowe MRN: [id number] DOB: [birth year]   Cancelled Treatment:     Stopped by room to discuss recent fall in bathroom. Pt informed PT that she had already been seen by therapy just recently. This was confirmed in the EMR, hence this session ended early.   Mackenzie Lia C 03/12/2015, 5:46 PM  5:48 PM  Etta Grandchild, PT, DPT [place] License # [id number]

## 2015-03-12 NOTE — Progress Notes (Signed)
Physical Therapy Treatment Patient Details Name: Haley Lowe MRN: [id number] DOB: 02-25-1956 Today's Date: 03/12/2015    History of Present Illness Pt is a 59yo white female currently undergoing cancer treatment, who presents with pancytopenia and SOB, found to by hypoxic. Pt being treated for HCAP, received on 3L for PT eval. Pt is not on Supplemental O2 at home.     PT Comments    Pt was seen for generalized strengthening and gait training.  She had been ambulating using IV pole and husbands SBA up until today.  This morning she states that her legs buckled as she was trying to stand from the commode (very low to the ground).  She now reports significant weakness in the thighs.  She does have difficulty in sit to stand from a standard height so bed was elevated to where she could stand.  She was instructed in correct technique and was successful for 5 repetitions.  We took her through LE strengthening exercise with focus on the quads bilaterally.  She tolerated this well.  Because of weakness, I instructed her in gait with a walker and she was able to ambulate for 400' with good stability.  I do feel that she would benefit from HHPT at d/c.  Pt is agreeable to this.  She has a walker at home.  She tolerated PT quite well with O2 at 2 L/min.  Follow Up Recommendations  Home health PT     Equipment Recommendations  None recommended by PT    Recommendations for Other Services       Precautions / Restrictions Precautions Precautions: Fall Restrictions Weight Bearing Restrictions: No    Mobility  Bed Mobility Overal bed mobility: Modified Independent                Transfers Overall transfer level: Needs assistance Equipment used: Rolling walker (2 wheeled) Transfers: Sit to/from Stand Sit to Stand: Supervision;From elevated surface         General transfer comment: pt is unable to rise from sitting unless bed is significantly elevated...sit to stand done x 5 repetions  with continual cues for anterior weight shift and forward thrust to come to stance  Ambulation/Gait Ambulation/Gait assistance: Supervision Ambulation Distance (Feet): 400 Feet Assistive device: Rolling walker (2 wheeled)     Gait velocity interpretation: <1.8 ft/sec, indicative of risk for recurrent falls General Gait Details: gait is very stable with a walker...walker would be recommended at this point due to increased LE weakness   Stairs            Wheelchair Mobility    Modified Rankin (Stroke Patients Only)       Balance Overall balance assessment: History of Falls (fall today related to LE weakness)                                  Cognition Arousal/Alertness: Awake/alert Behavior During Therapy: WFL for tasks assessed/performed Overall Cognitive Status: Within Functional Limits for tasks assessed                      Exercises General Exercises - Lower Extremity Ankle Circles/Pumps: AROM;Strengthening;Both;Supine;15 reps Quad Sets: AROM;Both;Supine;15 reps Gluteal Sets: AROM;Both;Supine;15 reps Short Arc Quad: AROM;Both;15 reps;Supine Long Arc Quad: AROM;Both;10 reps;Seated Straight Leg Raises: AROM;Both;10 reps;Supine Other Exercises Other Exercises: sit to stand x 5 repetitions    General Comments        Pertinent Vitals/Pain Pain  Assessment: No/denies pain    Home Living                      Prior Function            PT Goals (current goals can now be found in the care plan section) Progress towards PT goals: Progressing toward goals    Frequency  Min 3X/week    PT Plan Discharge plan needs to be updated    Co-evaluation             End of Session Equipment Utilized During Treatment: Gait belt;Oxygen Activity Tolerance: Patient tolerated treatment well Patient left: in bed;with call bell/phone within reach     Time: 1430-1517 PT Time Calculation (min) (ACUTE ONLY): 47 min  Charges:  $Gait  Training: 8-22 mins $Therapeutic Exercise: 8-22 mins $Therapeutic Activity: 8-22 mins                    G CodesSable Lowe  PT 03/12/2015, 3:23 PM [phone]

## 2015-03-13 MED ORDER — DEXAMETHASONE 1 MG PO TABS: 1.0000 mg | ORAL_TABLET | Freq: Every day | ORAL | Status: DC

## 2015-03-13 MED ORDER — ACYCLOVIR 200 MG PO CAPS: 400.0000 mg | ORAL_CAPSULE | Freq: Three times a day (TID) | ORAL | Status: DC

## 2015-03-13 MED ORDER — FLUCONAZOLE 100 MG PO TABS: 100.0000 mg | ORAL_TABLET | Freq: Every day | ORAL | Status: DC

## 2015-03-13 MED ORDER — PANTOPRAZOLE SODIUM 40 MG PO TBEC: 40.0000 mg | DELAYED_RELEASE_TABLET | Freq: Two times a day (BID) | ORAL | Status: AC

## 2015-03-13 MED ORDER — SUCRALFATE 1 GM/10ML PO SUSP: 1.0000 g | Freq: Three times a day (TID) | ORAL | Status: DC

## 2015-03-13 MED ORDER — MAGIC MOUTHWASH W/LIDOCAINE: 5.0000 mL | Freq: Four times a day (QID) | ORAL | Status: AC | PRN

## 2015-03-13 MED ORDER — SUCRALFATE 1 G PO TABS: 1.0000 g | ORAL_TABLET | Freq: Three times a day (TID) | ORAL | Status: DC

## 2015-03-13 MED ORDER — PANTOPRAZOLE SODIUM 40 MG PO TBEC
40.0000 mg | DELAYED_RELEASE_TABLET | Freq: Two times a day (BID) | ORAL | Status: DC
  Administered 2015-03-13: 40 mg via ORAL
  Filled 2015-03-13: qty 1

## 2015-03-13 NOTE — Care Management Note (Signed)
Case Management Note  Patient Details  Name: Haley Lowe MRN: [id number] Date of Birth: Sep 25, 1955  Pt discharging home today with self care. Pt now feeling much stronger and is refusing HH or OP physical therapy. Pt has no further CM needs.   Expected Discharge Date:    03/13/2015              Expected Discharge Plan:  Home/Self Care  In-House Referral:  NA  Discharge planning Services  CM Consult  Post Acute Care Choice:  NA Choice offered to:  NA  DME Arranged:    DME Agency:     HH Arranged:    HH Agency:     Status of Service:  Completed, signed off  Medicare Important Message Given:  Yes Date Medicare IM Given:    Medicare IM give by:    Date Additional Medicare IM Given:    Additional Medicare Important Message give by:     If discussed at [place] of Stay Meetings, dates discussed: 03/13/2015    Additional Comments:  Sherald Barge, RN 03/13/2015, 11:18 AM

## 2015-03-13 NOTE — Discharge Summary (Addendum)
Physician Discharge Summary  Haley Lowe KLK:[id number] DOB: Aug 20, 1955 DOA: 03/02/2015  PCP: Haley Lowe., MD  Admit date: 03/02/2015 Discharge date: 03/13/2015  Time spent: > 35 minutes  Recommendations for Outpatient Follow-up:  1. Pt will need to f/u with oncologist after d/c 2. Will be treated for candida and hsv esophagitis consider prolonging treatment regimen should symptoms recur after completion of antifungal and antiviral medication.   Discharge Diagnoses:  Active Problems:   Metastatic adenocarcinoma to brain, thyroid cancer   Sepsis ([place])   HCAP (healthcare-associated pneumonia)   Mucositis due to radiation therapy   COPD (chronic obstructive pulmonary disease) (HCC)   Pneumonia   Hyponatremia   Hypokalemia   Leukopenia   Thrombocytopenia (HCC)   Hypothyroid   Protein-calorie malnutrition, severe   Dysphagia   Odynophagia   Barrett's esophagus with dysplasia   Esophageal reflux   Metastatic cancer (HCC)   Mucosal abnormality of esophagus   Esophagitis   Mucositis oral   Herpes simplex esophagitis   Discharge Condition: stable  Diet recommendation: soft diet, advance as tolerated  Filed Weights   03/03/15 0520  Weight: 66.225 kg (146 lb)    History of present illness:  From original HPI: 59 y.o. female with a history of Metastatic Thyroid cancer on chemotherapy and Radiation rx with last radiation RX 4 days ago who presents to the ED with complaints of fevers chills and SOB along with poor appetite , and tongue and throat soreness. She had been placed on oral Clindamycin this past week and then changed to Levaquin for possible pneumonia by her PCP. She was evaluated in the ED and found to have hypoxia to the mid 80's and a pneumonia on chest X-ray. She was placed on IV Vancomycin and Aztreonam for HCAP and referred for admission.   Hospital Course:  Odynophagia/Dysphagia/esophageal stricture/esophageal candidiasis/herpes esophagitis Patient  underwent EGD 12/2. This revealed severe esophagitis with stricture. Evidence for Barrett's esophagus was also noted. Gastroenterology is following. KOH was positive suggesting yeast. The pathology report shows evidence for HSV.  Diflucan for 21 days per GI recs total treatment Acyclovir for 14 days total treatment - Will provide script for protonix on d/c - Add carafate on d/c  Healthcare associated pneumonia Patient has significantly improved. Patient met sepsis criteria at the time of admission with fever, tachycardia and tachypnea. She was started on broad-spectrum antibiotics. Cultures are all negative. Sepsis physiology resolved. Patient was changed over to IXL. Oxygen was also titrated down. Saturating well on 2 L by nasal cannula. Changed to oral Levaquin.  - completed 11 days of high dose levaquin. Will discontinue  Mucositis Improving. Secondary to radiation plus possible oropharyngeal candida. Apparently her chemotherapeutic agent can also cause mucositis. Continue lidocaine, magic mouthwash.   Metastatic thyroid cancer with metastases to the brain Continue Decadron but cut down the dose. Holding her Lenvatinib for now. This medication is known to cause GI perforation. She has recently completed radiation treatment. Followed by oncology at [facility] Discussed with her oncologist Haley Lowe, M.D.) 12/5. She advises to continue to hold her Lenvatinib for now as this medication can also cause mucositis. Will defer when to restart Lenvatinib to oncologist and will place order to schedule follow up if it has not been done already. She will make a follow-up appointment for this patient next week.  Protein-calorie malnutrition, severe As above.  History of COPD Stable.  Pancytopenia Cell counts have improved. Continue to monitor for now.  History of hypothyroidism Continue Synthroid.  Hypokalemia  Potassium will be  repleted.  Procedures:  None  Consultations:  GI  Discharge Exam: Filed Vitals:   03/13/15 1017 03/13/15 1021  BP: 86/64 98/63  Pulse: 87 94  Temp:    Resp:      General: Pt in nad, alert and awake Cardiovascular: no cyanosis Respiratory: no increased wob, no wheezes  Discharge Instructions   Discharge Instructions    Call MD for:  difficulty breathing, headache or visual disturbances    Complete by:  As directed      Call MD for:  redness, tenderness, or signs of infection (pain, swelling, redness, odor or green/yellow discharge around incision site)    Complete by:  As directed      Call MD for:  temperature >100.4    Complete by:  As directed      Diet - low sodium heart healthy    Complete by:  As directed      Discharge instructions    Complete by:  As directed   Pt should follow up with her oncologist so that plans can be made to continue treatment and recommendations for patient's metastatic thyroid cancer     Increase activity slowly    Complete by:  As directed           Discharge Medication List as of 03/13/2015 12:34 PM    START taking these medications   Details  sucralfate (CARAFATE) 1 G tablet Take 1 tablet (1 g total) by mouth 4 (four) times daily -  with meals and at bedtime., Starting 03/13/2015, Until Discontinued, Print    acyclovir (ZOVIRAX) 200 MG capsule Take 2 capsules (400 mg total) by mouth 3 (three) times daily., Starting 03/13/2015, Until Discontinued, Print    fluconazole (DIFLUCAN) 100 MG tablet Take 1 tablet (100 mg total) by mouth daily., Starting 03/13/2015, Until Discontinued, Print      CONTINUE these medications which have CHANGED   Details  dexamethasone (DECADRON) 1 MG tablet Take 1 tablet (1 mg total) by mouth daily., Starting 03/13/2015, Until Discontinued, Print      CONTINUE these medications which have NOT CHANGED   Details  ALPRAZolam (XANAX) 0.5 MG tablet Take 0.5 mg by mouth 3 (three) times daily as needed for  anxiety. , Starting 01/04/2012, Until Discontinued, Historical Med    amitriptyline (ELAVIL) 25 MG tablet Take 25 mg by mouth at bedtime., Until Discontinued, Historical Med    levothyroxine (SYNTHROID, LEVOTHROID) 137 MCG tablet Take 137 mcg by mouth daily before breakfast., Until Discontinued, Historical Med    Magnesium 250 MG TABS Take 1 tablet by mouth daily., Until Discontinued, Historical Med    morphine (MS CONTIN) 60 MG 12 hr tablet Take 60 mg by mouth every 12 (twelve) hours., Until Discontinued, Historical Med    ondansetron (ZOFRAN ODT) 8 MG disintegrating tablet Take 1 tablet (8 mg total) by mouth every 8 (eight) hours as needed for nausea or vomiting., Starting 04/05/2014, Until Discontinued, Print    Oxycodone HCl 20 MG TABS Take 1 tablet by mouth every 4 (four) hours as needed (pain)., Until Discontinued, Historical Med    polyethylene glycol (MIRALAX / GLYCOLAX) packet Take 17 g by mouth every other day. For constipation, Until Discontinued, Historical Med    Simethicone (GAS-X EXTRA STRENGTH PO) Take 1 tablet by mouth daily as needed (gas relief)., Until Discontinued, Historical Med    Wound Dressings (SONAFINE) Apply 1 application topically daily., Until Discontinued, Historical Med    magic mouthwash w/lidocaine  SOLN Take 5 mLs by mouth 4 (four) times daily as needed for mouth pain., Starting 02/25/2015, Until Discontinued, Normal    magic mouthwash SOLN Take 10 mLs by mouth 4 (four) times daily. Swish medication in mouth and spit it out, Starting 03/02/2015, Until Discontinued, Print      STOP taking these medications     Lenvatinib 14 MG Daily Dose (LENVIMA 14 MG DAILY DOSE) 10 & 4 MG CPPK      omeprazole (PRILOSEC) 40 MG capsule      ondansetron (ZOFRAN) 8 MG tablet        Allergies  Allergen Reactions  . Penicillins Swelling    " my brain swelled, and mymouth" Has patient had a PCN reaction causing immediate rash, facial/tongue/throat swelling, SOB or  lightheadedness with hypotension: No Has patient had a PCN reaction causing severe rash involving mucus membranes or skin necrosis: No Has patient had a PCN reaction that required hospitalization No Has patient had a PCN reaction occurring within the last 10 years: No If all of the above answers are "NO", then may proceed with Cephalosporin use.   . Other Swelling    PT ALSO REACTS TO PAPER TAPE AND BAND-AIDS.  Marland Kitchen Codeine Itching    Mild takes benadryl  . Tape Rash   Follow-up Information    Follow up with Kyung Rudd, MD In 2 weeks.   Specialty:  Radiation Oncology   Why:  Please follow up with Dr. Lisbeth Renshaw 1-2 weeks from discharge.   Contact information:   [address] [place] [address] [phone]       Follow up with Haley Lowe., MD On 03/26/2015.   Specialty:  Internal Medicine   Why:  at 9:30 am   Contact information:   [address] [place] [address] [phone]        The results of significant diagnostics from this hospitalization (including imaging, microbiology, ancillary and laboratory) are listed below for reference.    Significant Diagnostic Studies: Dg Chest 2 View  03/03/2015  CLINICAL DATA:  Acute onset of fever, cough, and mouth and throat sores. Status post recent radiation therapy for thyroid cancer. Recently diagnosed with pneumonia. Initial encounter. EXAM: CHEST  2 VIEW COMPARISON:  Chest radiograph performed 03/02/2015 FINDINGS: Persistent bibasilar airspace opacities, worse on the left, raise concern for pneumonia, similar in appearance to the prior study. No pleural effusion or pneumothorax is seen. The heart remains normal in size. No acute osseous abnormalities are identified. IMPRESSION: Persistent bibasilar airspace opacities, worse on the left, raise concern for mild pneumonia, similar appearance to the prior study. Electronically Signed   By: Garald Balding M.D.   On: 03/03/2015 00:56   Dg Chest 2 View  03/02/2015  CLINICAL  DATA:  Cough, congestion and sore mild as patient just completed radiation treatment for thyroid cancer. EXAM: CHEST  2 VIEW COMPARISON:  01/01/2015 FINDINGS: Lungs are adequately inflated and demonstrate worsening bibasilar heterogeneous density with bronchial thickening. No evidence of effusion. Stable borderline cardiomegaly. There is calcified plaque over the aortic arch. There are degenerative changes of the spine. IMPRESSION: Worsening heterogeneous bibasilar opacification with bronchial thickening as findings may be due to bronchitis, although cannot exclude pneumonia. Electronically Signed   By: Marin Olp M.D.   On: 03/02/2015 08:48    Microbiology: Recent Results (from the past 240 hour(s))  Culture, expectorated sputum-assessment     Status: None   Collection Time: 03/04/15 11:05 PM  Result Value Ref Range Status  Specimen Description SPUTUM EXPECTORATED  Final   Special Requests IMMUNE:COMPROMISED  Final   Sputum evaluation   Final    THIS SPECIMEN IS ACCEPTABLE. RESPIRATORY CULTURE REPORT TO FOLLOW. Performed at [facility]    Report Status 03/05/2015 FINAL  Final  Fungus Culture with Smear     Status: None (Preliminary result)   Collection Time: 03/04/15 11:05 PM  Result Value Ref Range Status   Specimen Description SPUTUM EXPECTORATED  Final   Special Requests IMMUNE:COMPROMISED  Final   Fungal Smear   Final    NO YEAST OR FUNGAL ELEMENTS SEEN Performed at [facility]    Culture   Final    CULTURE IN PROGRESS FOR FOUR WEEKS Performed at [facility]    Report Status PENDING  Incomplete  Culture, respiratory (NON-Expectorated)     Status: None   Collection Time: 03/04/15 11:05 PM  Result Value Ref Range Status   Specimen Description SPUTUM EXPECTORATED  Final   Special Requests IMMUNE:COMPROMISED  Final   Gram Stain   Final    ABUNDANT WBC PRESENT, PREDOMINANTLY PMN FEW SQUAMOUS EPITHELIAL CELLS PRESENT ABUNDANT GRAM POSITIVE COCCI IN  PAIRS IN CHAINS IN CLUSTERS RARE GRAM NEGATIVE RODS Performed at [facility]    Culture   Final    NORMAL OROPHARYNGEAL FLORA Performed at [facility]    Report Status 03/11/2015 FINAL  Final  Surgical pcr screen     Status: None   Collection Time: 03/07/15  6:16 AM  Result Value Ref Range Status   MRSA, PCR NEGATIVE NEGATIVE Final   Staphylococcus aureus NEGATIVE NEGATIVE Final    Comment:        The Xpert SA Assay (FDA approved for NASAL specimens in patients over 92 years of age), is one component of a comprehensive surveillance program.  Test performance has been validated by [facility] for patients greater than or equal to 93 year old. It is not intended to diagnose infection nor to guide or monitor treatment.   KOH prep     Status: None   Collection Time: 03/07/15 12:37 PM  Result Value Ref Range Status   Specimen Description ESOPHAGUS  Final   Special Requests NONE  Final   KOH Prep FEW YEAST  Final   Report Status 03/07/2015 FINAL  Final     Labs: Basic Metabolic Panel:  Recent Labs Lab 03/08/15 0603 03/09/15 0602 03/10/15 0856 03/11/15 0740 03/12/15 0632  NA 135 135 135 135 137  K 3.4* 3.7 3.7 3.2* 3.6  CL 107 104 105 105 103  CO2 22 23 20* 26 24  GLUCOSE 123* 119* 136* 108* 102*  BUN _0 CREATININE <0.30* 0.32* 0.36* 0.37* 0.39*  CALCIUM 8.0* 8.3* 8.3* 8.1* 8.4*  MG 2.1 2.0  --   --   --    Liver Function Tests: No results for input(s): AST, ALT, ALKPHOS, BILITOT, PROT, ALBUMIN in the last 168 hours. No results for input(s): LIPASE, AMYLASE in the last 168 hours. No results for input(s): AMMONIA in the last 168 hours. CBC:  Recent Labs Lab 03/07/15 0722 03/08/15 0603 03/10/15 0856  WBC 2.8* 2.5* 3.7*  HGB 15.2* 13.9 15.7*  HCT 45.1 41.8 46.2*  MCV 86.7 86.9 86.5  PLT 86* 105* 113*   Cardiac Enzymes: No results for input(s): CKTOTAL, CKMB, CKMBINDEX, TROPONINI in the last 168 hours. BNP: BNP (last 3  results) No results for input(s): BNP in the last 8760  hours.  ProBNP (last 3 results) No results for input(s): PROBNP in the last 8760 hours.  CBG: No results for input(s): GLUCAP in the last 168 hours.     Signed:  Velvet Bathe  [facility] 03/13/2015, 2:32 PM

## 2015-03-13 NOTE — Progress Notes (Signed)
Patient having trouble voiding this am. Bladder scan showed 800 cc urine retained in the bladder. Paged MD to get an order for a foley. Order received. When I got back to patient's room, she had voided 100 cc of urine and stated that she wanted to wait a little while longer to see if she would be able to void on her own. Passed this on to oncoming nurse.

## 2015-03-13 NOTE — Care Management Important Message (Signed)
Important Message  Patient Details  Name: Haley Lowe MRN: [id number] Date of Birth: 1956-03-15   Medicare Important Message Given:  Yes    Sherald Barge, RN 03/13/2015, 11:17 AM

## 2015-03-13 NOTE — Progress Notes (Signed)
Pt discharged home today per Dr. Wendee Beavers.  Pt's IV site D/C'd and WDL.  Pt's VSS.  Pt provided with home medication list, discharge instructions and prescriptions.  Verbalized understanding.  Pt left floor via WC in stable condition accompanied by NT and husband.

## 2015-03-15 ENCOUNTER — Inpatient Hospital Stay (HOSPITAL_COMMUNITY): Payer: Medicare Other

## 2015-03-15 ENCOUNTER — Emergency Department (HOSPITAL_COMMUNITY): Payer: Medicare Other

## 2015-03-15 ENCOUNTER — Encounter (HOSPITAL_COMMUNITY): Payer: Self-pay

## 2015-03-15 ENCOUNTER — Other Ambulatory Visit: Payer: Self-pay | Admitting: Gastroenterology

## 2015-03-15 ENCOUNTER — Inpatient Hospital Stay (HOSPITAL_COMMUNITY)
Admission: EM | Admit: 2015-03-15 | Discharge: 2015-03-18 | DRG: 190 | Disposition: A | Discharge status: Home / Self Care | Payer: Medicare Other | Attending: Internal Medicine | Admitting: Internal Medicine

## 2015-03-15 DIAGNOSIS — J690 Pneumonitis due to inhalation of food and vomit: Secondary | ICD-10-CM | POA: Diagnosis present

## 2015-03-15 DIAGNOSIS — I10 Essential (primary) hypertension: Secondary | ICD-10-CM | POA: Diagnosis present

## 2015-03-15 DIAGNOSIS — K219 Gastro-esophageal reflux disease without esophagitis: Secondary | ICD-10-CM | POA: Diagnosis present

## 2015-03-15 DIAGNOSIS — Z79891 Long term (current) use of opiate analgesic: Secondary | ICD-10-CM | POA: Diagnosis not present

## 2015-03-15 DIAGNOSIS — I959 Hypotension, unspecified: Secondary | ICD-10-CM

## 2015-03-15 DIAGNOSIS — Z923 Personal history of irradiation: Secondary | ICD-10-CM | POA: Diagnosis not present

## 2015-03-15 DIAGNOSIS — B0089 Other herpesviral infection: Secondary | ICD-10-CM | POA: Diagnosis present

## 2015-03-15 DIAGNOSIS — Z86711 Personal history of pulmonary embolism: Secondary | ICD-10-CM | POA: Diagnosis not present

## 2015-03-15 DIAGNOSIS — Z6826 Body mass index (BMI) 26.0-26.9, adult: Secondary | ICD-10-CM

## 2015-03-15 DIAGNOSIS — M1990 Unspecified osteoarthritis, unspecified site: Secondary | ICD-10-CM | POA: Diagnosis present

## 2015-03-15 DIAGNOSIS — K208 Other esophagitis without bleeding: Secondary | ICD-10-CM

## 2015-03-15 DIAGNOSIS — R1312 Dysphagia, oropharyngeal phase: Secondary | ICD-10-CM | POA: Diagnosis present

## 2015-03-15 DIAGNOSIS — E119 Type 2 diabetes mellitus without complications: Secondary | ICD-10-CM

## 2015-03-15 DIAGNOSIS — E785 Hyperlipidemia, unspecified: Secondary | ICD-10-CM | POA: Diagnosis present

## 2015-03-15 DIAGNOSIS — K2289 Other specified disease of esophagus: Secondary | ICD-10-CM

## 2015-03-15 DIAGNOSIS — E860 Dehydration: Secondary | ICD-10-CM | POA: Diagnosis not present

## 2015-03-15 DIAGNOSIS — M797 Fibromyalgia: Secondary | ICD-10-CM | POA: Diagnosis present

## 2015-03-15 DIAGNOSIS — Z825 Family history of asthma and other chronic lower respiratory diseases: Secondary | ICD-10-CM | POA: Diagnosis not present

## 2015-03-15 DIAGNOSIS — K1230 Oral mucositis (ulcerative), unspecified: Secondary | ICD-10-CM

## 2015-03-15 DIAGNOSIS — J449 Chronic obstructive pulmonary disease, unspecified: Secondary | ICD-10-CM | POA: Diagnosis present

## 2015-03-15 DIAGNOSIS — K22719 Barrett's esophagus with dysplasia, unspecified: Secondary | ICD-10-CM

## 2015-03-15 DIAGNOSIS — J45909 Unspecified asthma, uncomplicated: Secondary | ICD-10-CM | POA: Diagnosis present

## 2015-03-15 DIAGNOSIS — Z820 Family history of epilepsy and other diseases of the nervous system: Secondary | ICD-10-CM

## 2015-03-15 DIAGNOSIS — E871 Hypo-osmolality and hyponatremia: Secondary | ICD-10-CM

## 2015-03-15 DIAGNOSIS — Z86718 Personal history of other venous thrombosis and embolism: Secondary | ICD-10-CM

## 2015-03-15 DIAGNOSIS — Z833 Family history of diabetes mellitus: Secondary | ICD-10-CM | POA: Diagnosis not present

## 2015-03-15 DIAGNOSIS — Z800 Family history of malignant neoplasm of digestive organs: Secondary | ICD-10-CM

## 2015-03-15 DIAGNOSIS — Z841 Family history of disorders of kidney and ureter: Secondary | ICD-10-CM | POA: Diagnosis not present

## 2015-03-15 DIAGNOSIS — Y842 Radiological procedure and radiotherapy as the cause of abnormal reaction of the patient, or of later complication, without mention of misadventure at the time of the procedure: Secondary | ICD-10-CM

## 2015-03-15 DIAGNOSIS — Z8249 Family history of ischemic heart disease and other diseases of the circulatory system: Secondary | ICD-10-CM | POA: Diagnosis not present

## 2015-03-15 DIAGNOSIS — R918 Other nonspecific abnormal finding of lung field: Secondary | ICD-10-CM

## 2015-03-15 DIAGNOSIS — C73 Malignant neoplasm of thyroid gland: Secondary | ICD-10-CM

## 2015-03-15 DIAGNOSIS — K1233 Oral mucositis (ulcerative) due to radiation: Secondary | ICD-10-CM | POA: Diagnosis present

## 2015-03-15 DIAGNOSIS — J4489 Other specified chronic obstructive pulmonary disease: Secondary | ICD-10-CM

## 2015-03-15 DIAGNOSIS — Y95 Nosocomial condition: Secondary | ICD-10-CM | POA: Diagnosis present

## 2015-03-15 DIAGNOSIS — J9601 Acute respiratory failure with hypoxia: Secondary | ICD-10-CM | POA: Diagnosis present

## 2015-03-15 DIAGNOSIS — C7931 Secondary malignant neoplasm of brain: Secondary | ICD-10-CM | POA: Diagnosis present

## 2015-03-15 DIAGNOSIS — E039 Hypothyroidism, unspecified: Secondary | ICD-10-CM | POA: Diagnosis present

## 2015-03-15 DIAGNOSIS — D696 Thrombocytopenia, unspecified: Secondary | ICD-10-CM

## 2015-03-15 DIAGNOSIS — J440 Chronic obstructive pulmonary disease with acute lower respiratory infection: Principal | ICD-10-CM | POA: Diagnosis present

## 2015-03-15 DIAGNOSIS — J189 Pneumonia, unspecified organism: Secondary | ICD-10-CM | POA: Diagnosis present

## 2015-03-15 DIAGNOSIS — E1165 Type 2 diabetes mellitus with hyperglycemia: Secondary | ICD-10-CM | POA: Diagnosis present

## 2015-03-15 DIAGNOSIS — K209 Esophagitis, unspecified without bleeding: Secondary | ICD-10-CM

## 2015-03-15 DIAGNOSIS — K5900 Constipation, unspecified: Secondary | ICD-10-CM | POA: Diagnosis present

## 2015-03-15 DIAGNOSIS — Z9049 Acquired absence of other specified parts of digestive tract: Secondary | ICD-10-CM

## 2015-03-15 DIAGNOSIS — D61810 Antineoplastic chemotherapy induced pancytopenia: Secondary | ICD-10-CM | POA: Diagnosis present

## 2015-03-15 DIAGNOSIS — E43 Unspecified severe protein-calorie malnutrition: Secondary | ICD-10-CM

## 2015-03-15 DIAGNOSIS — R0789 Other chest pain: Secondary | ICD-10-CM

## 2015-03-15 DIAGNOSIS — R1310 Dysphagia, unspecified: Secondary | ICD-10-CM

## 2015-03-15 DIAGNOSIS — R0602 Shortness of breath: Secondary | ICD-10-CM | POA: Diagnosis not present

## 2015-03-15 DIAGNOSIS — K222 Esophageal obstruction: Secondary | ICD-10-CM | POA: Diagnosis present

## 2015-03-15 DIAGNOSIS — T451X5A Adverse effect of antineoplastic and immunosuppressive drugs, initial encounter: Secondary | ICD-10-CM

## 2015-03-15 DIAGNOSIS — E876 Hypokalemia: Secondary | ICD-10-CM

## 2015-03-15 DIAGNOSIS — Z90710 Acquired absence of both cervix and uterus: Secondary | ICD-10-CM | POA: Diagnosis not present

## 2015-03-15 DIAGNOSIS — B3781 Candidal esophagitis: Secondary | ICD-10-CM | POA: Diagnosis present

## 2015-03-15 DIAGNOSIS — L8990 Pressure ulcer of unspecified site, unspecified stage: Secondary | ICD-10-CM

## 2015-03-15 DIAGNOSIS — D72819 Decreased white blood cell count, unspecified: Secondary | ICD-10-CM

## 2015-03-15 DIAGNOSIS — C799 Secondary malignant neoplasm of unspecified site: Secondary | ICD-10-CM

## 2015-03-15 DIAGNOSIS — Z87891 Personal history of nicotine dependence: Secondary | ICD-10-CM | POA: Diagnosis not present

## 2015-03-15 DIAGNOSIS — K229 Disease of esophagus, unspecified: Secondary | ICD-10-CM | POA: Diagnosis present

## 2015-03-15 DIAGNOSIS — J9690 Respiratory failure, unspecified, unspecified whether with hypoxia or hypercapnia: Secondary | ICD-10-CM

## 2015-03-15 HISTORY — DX: Oral mucositis (ulcerative) due to radiation: K12.33

## 2015-03-15 HISTORY — DX: Other herpesviral infection: B00.89

## 2015-03-15 HISTORY — DX: Pneumonia, unspecified organism: J18.9

## 2015-03-15 HISTORY — DX: Type 2 diabetes mellitus without complications: E11.9

## 2015-03-15 LAB — CBC WITH DIFFERENTIAL/PLATELET
Basophils Absolute: 0 10*3/uL (ref 0.0–0.1)
Basophils Relative: 0 %
Eosinophils Absolute: 0 10*3/uL (ref 0.0–0.7)
Eosinophils Relative: 0 %
HEMATOCRIT: 33.6 % — AB (ref 36.0–46.0)
HEMOGLOBIN: 11.1 g/dL — AB (ref 12.0–15.0)
LYMPHS ABS: 0.4 10*3/uL — AB (ref 0.7–4.0)
LYMPHS PCT: 12 %
MCH: 28.8 pg (ref 26.0–34.0)
MCHC: 33 g/dL (ref 30.0–36.0)
MCV: 87 fL (ref 78.0–100.0)
Monocytes Absolute: 0.1 10*3/uL (ref 0.1–1.0)
Monocytes Relative: 4 %
NEUTROS ABS: 2.7 10*3/uL (ref 1.7–7.7)
NEUTROS PCT: 84 %
Platelets: 125 10*3/uL — ABNORMAL LOW (ref 150–400)
RBC: 3.86 MIL/uL — AB (ref 3.87–5.11)
RDW: 16.8 % — ABNORMAL HIGH (ref 11.5–15.5)
WBC: 3.2 10*3/uL — AB (ref 4.0–10.5)

## 2015-03-15 LAB — BLOOD GAS, ARTERIAL
ACID-BASE EXCESS: 1.3 mmol/L (ref 0.0–2.0)
BICARBONATE: 25.6 meq/L — AB (ref 20.0–24.0)
Drawn by: 105551
FIO2: 100
O2 SAT: 93.1 %
PCO2 ART: 38.4 mmHg (ref 35.0–45.0)
PO2 ART: 70.5 mmHg — AB (ref 80.0–100.0)
Patient temperature: 98.6
pH, Arterial: 7.432 (ref 7.350–7.450)

## 2015-03-15 LAB — URINALYSIS, ROUTINE W REFLEX MICROSCOPIC
BILIRUBIN URINE: NEGATIVE
GLUCOSE, UA: NEGATIVE mg/dL
HGB URINE DIPSTICK: NEGATIVE
Leukocytes, UA: NEGATIVE
Nitrite: NEGATIVE
Protein, ur: NEGATIVE mg/dL
SPECIFIC GRAVITY, URINE: 1.015 (ref 1.005–1.030)
pH: 6 (ref 5.0–8.0)

## 2015-03-15 LAB — COMPREHENSIVE METABOLIC PANEL
ALK PHOS: 71 U/L (ref 38–126)
ALT: 24 U/L (ref 14–54)
AST: 21 U/L (ref 15–41)
Albumin: 2.4 g/dL — ABNORMAL LOW (ref 3.5–5.0)
Anion gap: 8 (ref 5–15)
BUN: 12 mg/dL (ref 6–20)
CALCIUM: 8.4 mg/dL — AB (ref 8.9–10.3)
CO2: 26 mmol/L (ref 22–32)
CREATININE: 0.4 mg/dL — AB (ref 0.44–1.00)
Chloride: 101 mmol/L (ref 101–111)
Glucose, Bld: 157 mg/dL — ABNORMAL HIGH (ref 65–99)
Potassium: 3.4 mmol/L — ABNORMAL LOW (ref 3.5–5.1)
Sodium: 135 mmol/L (ref 135–145)
Total Bilirubin: 0.6 mg/dL (ref 0.3–1.2)
Total Protein: 5.2 g/dL — ABNORMAL LOW (ref 6.5–8.1)

## 2015-03-15 LAB — BRAIN NATRIURETIC PEPTIDE: B NATRIURETIC PEPTIDE 5: 81 pg/mL (ref 0.0–100.0)

## 2015-03-15 LAB — LACTIC ACID, PLASMA: Lactic Acid, Venous: 1.8 mmol/L (ref 0.5–2.0)

## 2015-03-15 LAB — TROPONIN I

## 2015-03-15 MED ORDER — ONDANSETRON HCL 4 MG/2ML IJ SOLN: 4.0000 mg | Freq: Four times a day (QID) | INTRAMUSCULAR | Status: DC | PRN

## 2015-03-15 MED ORDER — DEXAMETHASONE SODIUM PHOSPHATE 4 MG/ML IJ SOLN
2.0000 mg | INTRAMUSCULAR | Status: DC
  Administered 2015-03-15: 2 mg via INTRAVENOUS
  Filled 2015-03-15: qty 1

## 2015-03-15 MED ORDER — CEFEPIME HCL 2 G IJ SOLR
2.0000 g | Freq: Once | INTRAMUSCULAR | Status: AC
  Administered 2015-03-15: 2 g via INTRAVENOUS
  Filled 2015-03-15: qty 2

## 2015-03-15 MED ORDER — IOHEXOL 300 MG/ML  SOLN: 100.0000 mL | Freq: Once | INTRAMUSCULAR | Status: DC | PRN

## 2015-03-15 MED ORDER — MORPHINE SULFATE (PF) 4 MG/ML IV SOLN
4.0000 mg | INTRAVENOUS | Status: DC | PRN
  Administered 2015-03-16 – 2015-03-17 (×8): 4 mg via INTRAVENOUS
  Filled 2015-03-15 (×8): qty 1

## 2015-03-15 MED ORDER — VANCOMYCIN HCL IN DEXTROSE 1-5 GM/200ML-% IV SOLN
1000.0000 mg | Freq: Once | INTRAVENOUS | Status: AC
  Administered 2015-03-15: 1000 mg via INTRAVENOUS
  Filled 2015-03-15: qty 200

## 2015-03-15 MED ORDER — DEXTROSE 5 % IV SOLN
2.0000 g | Freq: Once | INTRAVENOUS | Status: AC
  Administered 2015-03-16: 2 g via INTRAVENOUS
  Filled 2015-03-15: qty 2

## 2015-03-15 MED ORDER — SODIUM CHLORIDE 0.9 % IV SOLN
INTRAVENOUS | Status: DC
  Administered 2015-03-15 – 2015-03-17 (×3): via INTRAVENOUS

## 2015-03-15 MED ORDER — ENOXAPARIN SODIUM 40 MG/0.4ML ~~LOC~~ SOLN
40.0000 mg | SUBCUTANEOUS | Status: DC
  Administered 2015-03-15 – 2015-03-18 (×3): 40 mg via SUBCUTANEOUS
  Filled 2015-03-15 (×3): qty 0.4

## 2015-03-15 MED ORDER — ONDANSETRON HCL 4 MG PO TABS: 4.0000 mg | ORAL_TABLET | Freq: Four times a day (QID) | ORAL | Status: DC | PRN

## 2015-03-15 MED ORDER — SODIUM CHLORIDE 0.9 % IV BOLUS (SEPSIS)
1000.0000 mL | Freq: Once | INTRAVENOUS | Status: AC
  Administered 2015-03-15: 1000 mL via INTRAVENOUS

## 2015-03-15 MED ORDER — SODIUM CHLORIDE 0.9 % IJ SOLN
3.0000 mL | Freq: Two times a day (BID) | INTRAMUSCULAR | Status: DC
  Administered 2015-03-15 – 2015-03-16 (×2): 3 mL via INTRAVENOUS

## 2015-03-15 NOTE — ED Notes (Signed)
Patient in by POV. Was on 3 Liters nasal cannula and sat was 76-79%. Increased to 6 liters Nasal cannula and her oxygen Sat increased to 86-87%. Placed on NRB and o2 sat increased to 96-97% on 12 liters.

## 2015-03-15 NOTE — ED Notes (Signed)
Oxygen was low at home on 3 liter nasal cannula. Having shortness of breath. Disoriented.

## 2015-03-15 NOTE — Progress Notes (Addendum)
[facility] for ceftazidime, vancomycin and acyclovir Indication: pneumonia  Allergies  Allergen Reactions  . Penicillins Swelling    " my brain swelled, and mymouth" Has patient had a PCN reaction causing immediate rash, facial/tongue/throat swelling, SOB or lightheadedness with hypotension: No Has patient had a PCN reaction causing severe rash involving mucus membranes or skin necrosis: No Has patient had a PCN reaction that required hospitalization No Has patient had a PCN reaction occurring within the last 10 years: No If all of the above answers are "NO", then may proceed with Cephalosporin use.   . Other Swelling    PT ALSO REACTS TO PAPER TAPE AND BAND-AIDS.  Marland Kitchen Codeine Itching    Mild takes benadryl  . Tape Rash    Patient Measurements: Height: 5' 2.5" (158.8 cm) Weight: 146 lb (66.225 kg) IBW/kg (Calculated) : 51.25  Vital Signs: Temp: 97.4 F (36.3 C) (12/10 2158) Temp Source: Rectal (12/10 2158) BP: 106/78 mmHg (12/10 2230) Pulse Rate: 88 (12/10 2230)  Labs:  Recent Labs  03/15/15 2100  WBC 3.2*  HGB 11.1*  PLT 125*  CREATININE 0.40*    Estimated Creatinine Clearance: 68.5 mL/min (by C-G formula based on Cr of 0.4).  No results for input(s): VANCOTROUGH, VANCOPEAK, VANCORANDOM, GENTTROUGH, GENTPEAK, GENTRANDOM, TOBRATROUGH, TOBRAPEAK, TOBRARND, AMIKACINPEAK, AMIKACINTROU, AMIKACIN in the last 72 hours.   Microbiology: Recent Results (from the past 720 hour(s))  Urine culture     Status: None   Collection Time: 03/02/15 10:55 PM  Result Value Ref Range Status   Specimen Description URINE, CLEAN CATCH  Final   Special Requests NONE  Final   Culture   Final    MULTIPLE SPECIES PRESENT, SUGGEST RECOLLECTION Performed at [facility]    Report Status 03/04/2015 FINAL  Final  Culture, blood (routine x 2)     Status: None   Collection Time: 03/02/15 11:59 PM  Result Value Ref Range Status   Specimen Description  BLOOD RIGHT HAND  Final   Special Requests BOTTLES DRAWN AEROBIC AND ANAEROBIC 6CC  Final   Culture NO GROWTH 5 DAYS  Final   Report Status 03/08/2015 FINAL  Final  Culture, blood (routine x 2)     Status: None   Collection Time: 03/03/15 12:09 AM  Result Value Ref Range Status   Specimen Description BLOOD LEFT HAND  Final   Special Requests BOTTLES DRAWN AEROBIC AND ANAEROBIC 6CC  Final   Culture NO GROWTH 5 DAYS  Final   Report Status 03/08/2015 FINAL  Final  Culture, expectorated sputum-assessment     Status: None   Collection Time: 03/04/15 11:05 PM  Result Value Ref Range Status   Specimen Description SPUTUM EXPECTORATED  Final   Special Requests IMMUNE:COMPROMISED  Final   Sputum evaluation   Final    THIS SPECIMEN IS ACCEPTABLE. RESPIRATORY CULTURE REPORT TO FOLLOW. Performed at [facility]    Report Status 03/05/2015 FINAL  Final  Fungus Culture with Smear     Status: None (Preliminary result)   Collection Time: 03/04/15 11:05 PM  Result Value Ref Range Status   Specimen Description SPUTUM EXPECTORATED  Final   Special Requests IMMUNE:COMPROMISED  Final   Fungal Smear   Final    NO YEAST OR FUNGAL ELEMENTS SEEN Performed at [facility]    Culture   Final    CULTURE IN PROGRESS FOR FOUR WEEKS Performed at [facility]    Report Status PENDING  Incomplete  Culture,  respiratory (NON-Expectorated)     Status: None   Collection Time: 03/04/15 11:05 PM  Result Value Ref Range Status   Specimen Description SPUTUM EXPECTORATED  Final   Special Requests IMMUNE:COMPROMISED  Final   Gram Stain   Final    ABUNDANT WBC PRESENT, PREDOMINANTLY PMN FEW SQUAMOUS EPITHELIAL CELLS PRESENT ABUNDANT GRAM POSITIVE COCCI IN PAIRS IN CHAINS IN CLUSTERS RARE GRAM NEGATIVE RODS Performed at [facility]    Culture   Final    NORMAL OROPHARYNGEAL FLORA Performed at [facility]    Report Status 03/11/2015 FINAL  Final  Surgical pcr screen      Status: None   Collection Time: 03/07/15  6:16 AM  Result Value Ref Range Status   MRSA, PCR NEGATIVE NEGATIVE Final   Staphylococcus aureus NEGATIVE NEGATIVE Final    Comment:        The Xpert SA Assay (FDA approved for NASAL specimens in patients over 43 years of age), is one component of a comprehensive surveillance program.  Test performance has been validated by [facility] for patients greater than or equal to 1 year old. It is not intended to diagnose infection nor to guide or monitor treatment.   KOH prep     Status: None   Collection Time: 03/07/15 12:37 PM  Result Value Ref Range Status   Specimen Description ESOPHAGUS  Final   Special Requests NONE  Final   KOH Prep FEW YEAST  Final   Report Status 03/07/2015 FINAL  Final    Medical History: Past Medical History  Diagnosis Date  . Essential hypertension   . Anxiety   . Depression   . COPD (chronic obstructive pulmonary disease) ([place])   . GERD (gastroesophageal reflux disease)   . Headache(784.0)   . Arthritis   . Hx of adenomatous colonic polyps   . Emphysema   . Cataract     1 lens implaqnt right eye,intact on left eye  . Goiter   . Fibromyalgia   . Pulmonary emboli ([place]) 11/04/11    Right upper lobe and r lower lobe PE  . Lung nodule seen on imaging study 11/04/11 CT    17m LLL  . DVT (deep venous thrombosis) ([place] 12/09/2011  . [place]  . Hyperlipidemia   . History of radiation therapy 10/06/11    SRS 15Gy 158f brain  . UTI (lower urinary tract infection)   . Borderline diabetes mellitus   . Metastatic adenocarcinoma [place] brain [place]    Left frontoparietal region  . Thy[place] cancer [place]  . S/P radiation therapy 05/31/14 SRS    Brain  . PONV (postoperative nausea and vomiting)     Medications:  Scheduled:  . dexamethasone  2 mg Intravenous Q24H  . enoxaparin (LOVENOX) injection  40 mg Subcutaneous Q24H  . sodium chloride  3 mL Intravenous Q12H     Assessment: 89o female readmitted from a couple days ago with HCAP. She was also treated for herpes and candidal esophagitis and mucositis.  Goal of Therapy:  vanc trough 15-20 eradication of infection  Plan:  Cont fortaz 2 gm IV q8 hours Cont vancomycin 750 mg IV q12 hours Start acyclovir 510 mg IV q8 hours F/u renal function, cultures and clinical course  Thanks for allowing pharmacy to be a part of this patient's care.  LoExcell SeltzerPharmD Clinical Pharmacist 03/15/2015,10:58 PM

## 2015-03-15 NOTE — H&P (Addendum)
[facility] History and Physical  Haley Lowe XBD:[id number] DOB: [birth year] DOA: 03/15/2015  Referring physician: ER PCP: Haley Lowe., MD   Chief Complaint: Dyspnea  HPI: Haley Lowe is a 59 y.o. female  This is a 59 year old lady was discharged from the hospital just a couple of days ago after she had been admitted with and treated for hospital-acquired pneumonia. She seemed to be doing well when she was discharged and was eating well. However in the last 24 hours she has gone downhill with increasing chest congestion and respiratory distress associated with altered mental status. The patient is self cannot give me any history because she is very drowsy and not very responsive and in a nonrebreather mask. Her saturations at home on room air were 60-70%. In the emergency room, her pulse oximeter was 70-80% on 6 L/m of nasal cannula. She was subsequently placed on a nonrebreather mask. The family tell me that she has been somewhat choking on some oral liquids at home. She is now being admitted for further management.   Review of Systems:  Patient unable to give me a review of systems secondary to her altered mental status and reduce level of consciousness.  Past Medical History  Diagnosis Date  . Essential hypertension   . Anxiety   . Depression   . COPD (chronic obstructive pulmonary disease) ([place])   . GERD (gastroesophageal reflux disease)   . Headache(784.0)   . Arthritis   . Hx of adenomatous colonic polyps   . Emphysema   . Cataract     1 lens implaqnt right eye,intact on left eye  . Goiter   . Fibromyalgia   . Pulmonary emboli ([place]) 11/04/11    Right upper lobe and r lower lobe PE  . Lung nodule seen on imaging study 11/04/11 CT    19m LLL  . D[place]) [place] 12/09[place]  . [place]monary embolism ([place] 12/09/2011  . Hyperlipidemia   . History of radiation therapy 10/06/11    SRS 15Gy 188f brain  . UTI (lower urinary tract infection)   . Borderline  diabetes mellitus   . Metastatic [place] brain [place]    Left frontoparietal region  . Thyroid cancer ([facility]  . S/P radiation therapy 05/31/14 SRS    Brain  . PONV (postoperative nausea and vomiting)    Past Surgical History  Procedure Laterality Date  . Abdominal hysterectomy  1996  . Cholecystectomy  2000  . Shoulder surgery Right 1998  . Eye surgery  2001  . Foot surgery Left 2005  . Craniotomy  09/15/2011    Procedure: CRANIOTOMY TUMOR EXCISION;  Surgeon: RoHosie SpangleMD;  [facility];  Service: Neurosurgery;  Laterality: N/A;  Craniotomy resection of tumor with stealth  . Cataract extraction  2011    With lens implant  . [facility]    . Video bronchoscopy with endobronchial ultrasound N/A 04/11/2014    Procedure: VIDEO BRONCHOSCOPY WITH ENDOBRONCHIAL ULTRASOUND;  Surgeon: StMelrose NakayamaMD;  [place] [facility] Service: Thoracic;  Laterality: N/A;  . Esophagogastroduodenoscopy (egd) with propofol N/A 03/07/2015    Procedure: ESOPHAGOGASTRODUODENOSCOPY (EGD) WITH PROPOFOL;  Surgeon: RoDaneil DolinMD;  [facility];  Service: Endoscopy;  Laterality: N/A;  . Esophageal biopsy  03/07/2015    Procedure: BIOPSY;  Surgeon: RoDaneil DolinMD;  [facility];  Service: Endoscopy;;  Social History:  reports that she quit smoking about 3 years ago. Her smoking use included Cigarettes. She has a 40 pack-year smoking history. She has never used smokeless tobacco. She reports that she does not drink alcohol or use illicit drugs.  Allergies  Allergen Reactions  . Penicillins Swelling    " my brain swelled, and mymouth" Has patient had a PCN reaction causing immediate rash, facial/tongue/throat swelling, SOB or lightheadedness with hypotension: No Has patient had a PCN reaction causing severe rash involving mucus membranes or skin necrosis: No Has  patient had a PCN reaction that required hospitalization No Has patient had a PCN reaction occurring within the last 10 years: No If all of the above answers are "NO", then may proceed with Cephalosporin use.   . Other Swelling    PT ALSO REACTS TO PAPER TAPE AND BAND-AIDS.  Marland Kitchen Codeine Itching    Mild takes benadryl  . Tape Rash    Family History  Problem Relation Age of Onset  . Asthma Mother   . Kidney failure Father   . Diabetes Sister   . Heart attack Sister   . Colon cancer Brother   . COPD Sister   . Aneurysm Paternal Grandmother     Brain  . Parkinsonism Maternal Uncle   . COPD Brother     Prior to Admission medications   Medication Sig Start Date End Date Taking? Authorizing Provider  acyclovir (ZOVIRAX) 200 MG capsule Take 2 capsules (400 mg total) by mouth 3 (three) times daily. 03/13/15  Yes Velvet Bathe, MD  ALPRAZolam Duanne Moron) 0.5 MG tablet Take 0.5 mg by mouth 3 (three) times daily as needed for anxiety.  01/04/12  Yes Historical Provider, MD  amitriptyline (ELAVIL) 25 MG tablet Take 25 mg by mouth at bedtime.   Yes Historical Provider, MD  amLODipine (NORVASC) 10 MG tablet Take 10 mg by mouth daily.   Yes Historical Provider, MD  dexamethasone (DECADRON) 1 MG tablet Take 1 tablet (1 mg total) by mouth daily. 03/13/15  Yes Velvet Bathe, MD  fluconazole (DIFLUCAN) 100 MG tablet Take 1 tablet (100 mg total) by mouth daily. 03/13/15  Yes Velvet Bathe, MD  levothyroxine (SYNTHROID, LEVOTHROID) 137 MCG tablet Take 137 mcg by mouth daily before breakfast.   Yes Historical Provider, MD  magic mouthwash w/lidocaine SOLN Take 5 mLs by mouth 4 (four) times daily as needed for mouth pain. 03/13/15  Yes Velvet Bathe, MD  Magnesium 250 MG TABS Take 1 tablet by mouth daily.   Yes Historical Provider, MD  morphine (MS CONTIN) 60 MG 12 hr tablet Take 60 mg by mouth every 12 (twelve) hours.   Yes Historical Provider, MD  ondansetron (ZOFRAN ODT) 8 MG disintegrating tablet Take 1 tablet (8  mg total) by mouth every 8 (eight) hours as needed for nausea or vomiting. 04/05/14  Yes Pattricia Boss, MD  Oxycodone HCl 20 MG TABS Take 1 tablet by mouth every 4 (four) hours as needed (pain).   Yes Historical Provider, MD  pantoprazole (PROTONIX) 40 MG tablet Take 1 tablet (40 mg total) by mouth 2 (two) times daily. 03/13/15  Yes Velvet Bathe, MD  polyethylene glycol (MIRALAX / GLYCOLAX) packet Take 17 g by mouth every other day. For constipation   Yes Historical Provider, MD  Simethicone (GAS-X EXTRA STRENGTH PO) Take 1 tablet by mouth daily as needed (gas relief).   Yes Historical Provider, MD  sucralfate (CARAFATE) 1 G tablet Take 1 tablet (1 g total) by mouth  4 (four) times daily -  with meals and at bedtime. 03/13/15  Yes Velvet Bathe, MD  Wound Dressings (SONAFINE) Apply 1 application topically daily.   Yes Historical Provider, MD   Physical Exam: Filed Vitals:   03/15/15 2100 03/15/15 2135 03/15/15 2136 03/15/15 2158  BP: 90/67 102/75  102/75  Pulse: 93  89 91  Temp:    97.4 F (36.3 C)  TempSrc:    Rectal  Resp: '16 16 22 21  '$ Height:      Weight:      SpO2: 100%  98% 99%    Wt Readings from Last 3 Encounters:  03/15/15 66.225 kg (146 lb)  03/03/15 66.225 kg (146 lb)  03/02/15 69.854 kg (154 lb)    General:  Appears sick. She is hypotensive. She has cool peripheries and seemed that she is in clinical shock. Eyes: PERRL, normal lids, irises & conjunctiva ENT: grossly normal hearing, lips & tongue Neck: no LAD, masses or thyromegaly Cardiovascular: RRR, no m/r/g. No LE edema. Telemetry: SR, no arrhythmias  Respiratory: Bilateral crackles in the mid and lower zones. There is no bronchial breathing. Abdomen: soft, ntnd Skin: no rash or induration seen on limited exam Musculoskeletal: grossly normal tone BUE/BLE Psychiatric: Not examined. Neurologic: grossly non-focal.          Labs on Admission:  Basic Metabolic Panel:  Recent Labs Lab 03/09/15 0602 03/10/15 0856  03/11/15 0740 03/12/15 0632 03/15/15 2100  NA 135 135 135 137 135  K 3.7 3.7 3.2* 3.6 3.4*  CL 104 105 105 103 101  CO2 23 20* '26 24 26  '$ GLUCOSE 119* 136* 108* 102* 157*  BUN '12 10 10 8 12  '$ CREATININE 0.32* 0.36* 0.37* 0.39* 0.40*  CALCIUM 8.3* 8.3* 8.1* 8.4* 8.4*  MG 2.0  --   --   --   --    Liver Function Tests:  Recent Labs Lab 03/15/15 2100  AST 21  ALT 24  ALKPHOS 71  BILITOT 0.6  PROT 5.2*  ALBUMIN 2.4*   No results for input(s): LIPASE, AMYLASE in the last 168 hours. No results for input(s): AMMONIA in the last 168 hours. CBC:  Recent Labs Lab 03/10/15 0856 03/15/15 2100  WBC 3.7* 3.2*  NEUTROABS  --  2.7  HGB 15.7* 11.1*  HCT 46.2* 33.6*  MCV 86.5 87.0  PLT 113* 125*   Cardiac Enzymes: No results for input(s): CKTOTAL, CKMB, CKMBINDEX, TROPONINI in the last 168 hours.  BNP (last 3 results)  Recent Labs  03/15/15 2100  BNP 81.0    ProBNP (last 3 results) No results for input(s): PROBNP in the last 8760 hours.  CBG: No results for input(s): GLUCAP in the last 168 hours.  Radiological Exams on Admission: Dg Chest 2 View  03/15/2015  CLINICAL DATA:  Recently diagnosed with pneumonia. Severe shortness of breath and generalized weakness. Initial encounter. EXAM: CHEST  2 VIEW COMPARISON:  Chest radiograph performed 03/03/2015 FINDINGS: The lungs are well-aerated. Mild bibasilar opacities raise concern for mildly worsening multifocal pneumonia. There is no evidence of pleural effusion or pneumothorax. The heart is normal in size; the mediastinal contour is within normal limits. No acute osseous abnormalities are seen. Clips are noted within the right upper quadrant, reflecting prior cholecystectomy. IMPRESSION: Mild bibasilar opacities are mildly worsened, raising concern for mildly worsening multifocal pneumonia. Electronically Signed   By: Garald Balding M.D.   On: 03/15/2015 21:38   Ct Head Wo Contrast  03/15/2015  CLINICAL DATA:  Frequent  falls,  confusion. EXAM: CT HEAD WITHOUT CONTRAST TECHNIQUE: Contiguous axial images were obtained from the base of the skull through the vertex without intravenous contrast. COMPARISON:  CT scan of May 04, 2014. FINDINGS: Status post left frontal craniotomy. Otherwise bony calvarium appears intact. Stable small focus of postoperative encephalomalacia in superior portion of left parietal cortex. No mass effect or midline shift is noted. Ventricular size is within normal limits. There is no evidence of mass lesion, hemorrhage or acute infarction. IMPRESSION: Status post left frontal craniotomy. No acute intracranial abnormality seen. Electronically Signed   By: Marijo Conception, M.D.   On: 03/15/2015 21:34      Assessment/Plan   1. Healthcare associated pneumonia. I think she may have aspirated and her chest x-ray appears to be worse. She will be treated with broad-spectrum antibiotics intravenously. 2. Acute respiratory failure. She is requiring nonrebreather mask. I'm going to obtain a CT chest scan to make sure there is no other source of her respiratory failure apart from the healthcare associated pneumonia. She may need intubation and mechanical ventilation. Pulmonary consultation. 3. Hypotension. This may be due to sepsis. She will be treated with bolus of IV fluids and monitor blood pressure closely. She may need pressors if she does not improve. 4. 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 This appears to be stable.  She will be admitted to the stepdown unit. Further recommendations will depend on patient's hospital progress.  Code Status: Full code.   DVT Prophylaxis: Lovenox.  Family Communication: I discussed the plan with the patient's daughters at the bedside. I explained to them that she looks very sick and she may go downhill. They understand and appreciate.  Disposition Plan: Depending on progress.   Time spent: 60 minutes.  Doree Albee [facility] Pager [phone].

## 2015-03-15 NOTE — ED Provider Notes (Signed)
CSN: [id number]     Arrival date & time 03/15/15  1952 History   First MD Initiated Contact with Patient 03/15/15 2006     Chief Complaint  Patient presents with  . Shortness of Breath     (Consider location/radiation/quality/duration/timing/severity/associated sxs/prior Treatment) HPI 59 year old female who presents with shortness of breath. 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 She has finished a course of Levaquin while in the hospital. Was not discharged home on oxygen. Family says that initially she was doing well, but yesterday evening began to have shortness of breath and generalized weakness. Has had decreased urine output as well as constipation. Family began to notice that she seemed disoriented throughout the day today. Has had 2 or 3 falls where she has hit her head without loss of consciousness. When they checked her oxygenation at home on room air she was noted to have pulse ox of around 60-70%. His family had called the primary care provider today to arrange for home oxygen, but was still having low oxygenation on even 3 L home oxygen and so was brought to the ED for evaluation. In our triage, she was noted to have pulse ox between 70-80% on 6 L nasal cannula. Was subsequently placed on a nonrebreather. Family has noted that she has had persistent cough with mild congestion and sputum production, not worse since discharge. She has not had any fever, chills or night sweats. Denies any nausea or vomiting, abdominal pain, dysuria. Denies chest pain but with trace bilateral pedal edema.   Past Medical History  Diagnosis Date  . Essential hypertension   . Anxiety   . Depression   . COPD (chronic obstructive pulmonary disease) ([place])   . GERD (gastroesophageal  reflux disease)   . Headache(784.0)   . Arthritis   . Hx of adenomatous colonic polyps   . Emphysema   . Cataract     1 lens implaqnt right eye,intact on left eye  . Goiter   . Fibromyalgia   . Pulmonary emboli ([place]) 11/04/11    Right upper lobe and r lower lobe PE  . Lung nodule seen on imaging study 11/04/11 CT    55m LLL  . D[place]  . [place]  . Hyperlipidemia   . History of radiation therapy 10/06/11    SRS 15Gy 136f brain  . UTI (lower urinary tract infection)   . Borderline diabetes mellitus   . Metastatic adenocarcinoma to brain ([facility]13    Left frontoparietal region  . Thyroid cancer ([facility]  . S/P radiation therapy 05/31/14 SRS    Brain  . PONV (postoperative nausea and vomiting)    Past Surgical History  Procedure Laterality Date  . Abdominal hysterectomy  1996  . Cholecystectomy  2000  . Shoulder surgery Right 1998  . Eye surgery  2001  . Foot surgery Left 2005  . Craniotomy  09/15/2011    Procedure: CRANIOTOMY TUMOR EXCISION;  Surgeon: RoHosie SpangleMD;  [facility];  Service: Neurosurgery;  Laterality: N/A;  Craniotomy resection of tumor with stealth  . Cataract extraction  2011    With lens implant  . [facility]    .  Video bronchoscopy with endobronchial ultrasound N/A 04/11/2014    Procedure: VIDEO BRONCHOSCOPY WITH ENDOBRONCHIAL ULTRASOUND;  Surgeon: Melrose Nakayama, MD;  Location: [place];  Service: Thoracic;  Laterality: N/A;  . Esophagogastroduodenoscopy (egd) with propofol N/A 03/07/2015    Procedure: ESOPHAGOGASTRODUODENOSCOPY (EGD) WITH PROPOFOL;  Surgeon: Daneil Dolin, MD;  [facility];  Service: Endoscopy;  Laterality: N/A;  . Esophageal biopsy  03/07/2015    Procedure: BIOPSY;  Surgeon: Daneil Dolin, MD;  [facility];  Service: Endoscopy;;   Family History   Problem Relation Age of Onset  . Asthma Mother   . Kidney failure Father   . Diabetes Sister   . Heart attack Sister   . Colon cancer Brother   . COPD Sister   . Aneurysm Paternal Grandmother     Brain  . Parkinsonism Maternal Uncle   . COPD Brother    Social History  Substance Use Topics  . Smoking status: Former Smoker -- 1.00 packs/day for 40 years    Types: Cigarettes    Quit date: 04/06/2011  . Smokeless tobacco: Never Used  . Alcohol Use: No   OB History    No data available     Review of Systems 10/14 systems reviewed and are negative other than those stated in the HPI    Allergies  Penicillins; Other; Codeine; and Tape  Home Medications   Prior to Admission medications   Medication Sig Start Date End Date Taking? Authorizing Provider  acyclovir (ZOVIRAX) 200 MG capsule Take 2 capsules (400 mg total) by mouth 3 (three) times daily. 03/13/15  Yes Velvet Bathe, MD  ALPRAZolam Duanne Moron) 0.5 MG tablet Take 0.5 mg by mouth 3 (three) times daily as needed for anxiety.  01/04/12  Yes Historical Provider, MD  amitriptyline (ELAVIL) 25 MG tablet Take 25 mg by mouth at bedtime.   Yes Historical Provider, MD  amLODipine (NORVASC) 10 MG tablet Take 10 mg by mouth daily.   Yes Historical Provider, MD  dexamethasone (DECADRON) 1 MG tablet Take 1 tablet (1 mg total) by mouth daily. 03/13/15  Yes Velvet Bathe, MD  fluconazole (DIFLUCAN) 100 MG tablet Take 1 tablet (100 mg total) by mouth daily. 03/13/15  Yes Velvet Bathe, MD  levothyroxine (SYNTHROID, LEVOTHROID) 137 MCG tablet Take 137 mcg by mouth daily before breakfast.   Yes Historical Provider, MD  magic mouthwash w/lidocaine SOLN Take 5 mLs by mouth 4 (four) times daily as needed for mouth pain. 03/13/15  Yes Velvet Bathe, MD  Magnesium 250 MG TABS Take 1 tablet by mouth daily.   Yes Historical Provider, MD  morphine (MS CONTIN) 60 MG 12 hr tablet Take 60 mg by mouth every 12 (twelve) hours.   Yes Historical Provider, MD   ondansetron (ZOFRAN ODT) 8 MG disintegrating tablet Take 1 tablet (8 mg total) by mouth every 8 (eight) hours as needed for nausea or vomiting. 04/05/14  Yes Pattricia Boss, MD  Oxycodone HCl 20 MG TABS Take 1 tablet by mouth every 4 (four) hours as needed (pain).   Yes Historical Provider, MD  pantoprazole (PROTONIX) 40 MG tablet Take 1 tablet (40 mg total) by mouth 2 (two) times daily. 03/13/15  Yes Velvet Bathe, MD  polyethylene glycol (MIRALAX / GLYCOLAX) packet Take 17 g by mouth every other day. For constipation   Yes Historical Provider, MD  Simethicone (GAS-X EXTRA STRENGTH PO) Take 1 tablet by mouth daily as needed (gas relief).   Yes Historical Provider, MD  sucralfate (CARAFATE) 1 G tablet Take 1 tablet (1 g total) by mouth 4 (four) times daily -  with meals and at bedtime. 03/13/15  Yes Velvet Bathe, MD  Wound Dressings (SONAFINE) Apply 1 application topically daily.   Yes Historical Provider, MD   BP 106/78 mmHg  Pulse 88  Temp(Src) 97.4 F (36.3 C) (Rectal)  Resp 15  Ht 5' 2.5" (1.588 m)  Wt 146 lb (66.225 kg)  BMI 26.26 kg/m2  SpO2 100% Physical Exam Physical Exam  Nursing note and vitals reviewed. Constitutional: Chronically and acutely ill appearing, in  No distress Head: Normocephalic and atraumatic.  Mouth/Throat: Oropharynx is clear and dry. Vesicular lesions in the posterior oropharynx.  Neck: Normal range of motion. Neck supple.  Cardiovascular: Tachycardic rate and regular rhythm.  tace pedal edema bilaterally Pulmonary/Chest: Effort normal on NRB. Speaks in full sentences, bibasilar crackles, no wheezes Abdominal: Soft. Moderate distension There is no tenderness. There is no rebound and no guarding.  Musculoskeletal: Normal range of motion.  Neurological: Alert, no facial droop, fluent speech, moves all extremities symmetrically Skin: Skin is warm and dry.  Psychiatric: Cooperative   ED Course  Procedures (including critical care time) Labs Review Labs Reviewed   CBC WITH DIFFERENTIAL/PLATELET - Abnormal; Notable for the following:    WBC 3.2 (*)    RBC 3.86 (*)    Hemoglobin 11.1 (*)    HCT 33.6 (*)    RDW 16.8 (*)    Platelets 125 (*)    Lymphs Abs 0.4 (*)    All other components within normal limits  COMPREHENSIVE METABOLIC PANEL - Abnormal; Notable for the following:    Potassium 3.4 (*)    Glucose, Bld 157 (*)    Creatinine, Ser 0.40 (*)    Calcium 8.4 (*)    Total Protein 5.2 (*)    Albumin 2.4 (*)    All other components within normal limits  BLOOD GAS, ARTERIAL - Abnormal; Notable for the following:    pO2, Arterial 70.5 (*)    Bicarbonate 25.6 (*)    All other components within normal limits  BRAIN NATRIURETIC PEPTIDE  LACTIC ACID, PLASMA  TROPONIN I  URINALYSIS, ROUTINE W REFLEX MICROSCOPIC (NOT AT [facility])  LACTIC ACID, PLASMA  COMPREHENSIVE METABOLIC PANEL  CBC  BLOOD GAS, ARTERIAL    Imaging Review Dg Chest 2 View  03/15/2015  CLINICAL DATA:  Recently diagnosed with pneumonia. Severe shortness of breath and generalized weakness. Initial encounter. EXAM: CHEST  2 VIEW COMPARISON:  Chest radiograph performed 03/03/2015 FINDINGS: The lungs are well-aerated. Mild bibasilar opacities raise concern for mildly worsening multifocal pneumonia. There is no evidence of pleural effusion or pneumothorax. The heart is normal in size; the mediastinal contour is within normal limits. No acute osseous abnormalities are seen. Clips are noted within the right upper quadrant, reflecting prior cholecystectomy. IMPRESSION: Mild bibasilar opacities are mildly worsened, raising concern for mildly worsening multifocal pneumonia. Electronically Signed   By: Garald Balding M.D.   On: 03/15/2015 21:38   Ct Head Wo Contrast  03/15/2015  CLINICAL DATA:  Frequent falls, confusion. EXAM: CT HEAD WITHOUT CONTRAST TECHNIQUE: Contiguous axial images were obtained from the base of the skull through the vertex without intravenous contrast. COMPARISON:  CT scan  of May 04, 2014. FINDINGS: Status post left frontal craniotomy. Otherwise bony calvarium appears intact. Stable small focus of postoperative encephalomalacia in superior portion of left parietal cortex. No mass effect or midline shift is noted. Ventricular size is within normal  limits. There is no evidence of mass lesion, hemorrhage or acute infarction. IMPRESSION: Status post left frontal craniotomy. No acute intracranial abnormality seen. Electronically Signed   By: Marijo Conception, M.D.   On: 03/15/2015 21:34   I have personally reviewed and evaluated these images and lab results as part of my medical decision-making.   EKG Interpretation   Date/Time:  Saturday March 15 2015 20:16:28 EST Ventricular Rate:  88 PR Interval:  128 QRS Duration: 169 QT Interval:  361 QTC Calculation: 437 R Axis:   -28 Text Interpretation:  Sinus rhythm Right atrial enlargement Probable left  ventricular hypertrophy Artifact in lead(s) I II III aVR aVL aVF No  appreciable change since last EKG Confirmed by Xavi Tomasik MD, Ceasia Elwell ([id number]) on  03/15/2015 8:38:42 PM     CRITICAL CARE Performed by: Forde Dandy   Total critical care time: 35 minutes  Critical care time was exclusive of separately billable procedures and treating other patients.  Critical care was necessary to treat or prevent imminent or life-threatening deterioration.  Critical care was time spent personally by me on the following activities: development of treatment plan with patient and/or surrogate as well as nursing, discussions with consultants, evaluation of patient's response to treatment, examination of patient, obtaining history from patient or surrogate, ordering and performing treatments and interventions, ordering and review of laboratory studies, ordering and review of radiographic studies, pulse oximetry and re-evaluation of patient's condition.  MDM   Final diagnoses:  Acute respiratory failure with hypoxia ([place])  HCAP  (healthcare-associated pneumonia)    59 year old female with history of COPD, prior history of PE/DVT, and metastatic thyroid cancer who presents with acute hypoxic respiratory failure. In triage is noted to be hypoxic on 2-3 L to 70%. Was initially placed on 6 L nasal cannula, with persistent hypoxia of 80%. Subsequently placed on nonrebreather with increased work of breathing, and normal oxygenation. Is afebrile, intermittently tachycardic, with soft blood pressures in the high 90s to low 100s. Concern for worsening pneumonia versus PE versus other acute intrathoracic processes. Her EKG is not acutely ischemic and no evidence of significant heart strain. Chest x-ray shows mildly worsening infiltrates suggestive of worsening multifocal pneumonia. No evidence of fluid overload. She was empirically covered with vancomycin and cefepime. Given drastic hypoxia, she will require CT of her chest. Will plan for admission to ICU under triad hospitalist service in the interim.   Forde Dandy, MD 03/15/15 [phone]

## 2015-03-15 NOTE — Progress Notes (Unsigned)
Received call from daughter, stating she was afraid that the magic mouthwash did not have hydrocortisone in it as it did in the hospital. I contacted Larene Pickett, who states that the [facility] magic mouthwash does have lidocaine, benadryl, nystatin, AND dexamethasone in it, which would be similar to the hydrocortisone. Patient is doing well and tolerating diet. Still with some oral discomfort but able to tolerate liquids and solids.

## 2015-03-15 NOTE — ED Notes (Signed)
Attempted to call report at this time 

## 2015-03-16 DIAGNOSIS — C7931 Secondary malignant neoplasm of brain: Secondary | ICD-10-CM

## 2015-03-16 DIAGNOSIS — B0089 Other herpesviral infection: Secondary | ICD-10-CM

## 2015-03-16 DIAGNOSIS — K1233 Oral mucositis (ulcerative) due to radiation: Secondary | ICD-10-CM

## 2015-03-16 DIAGNOSIS — J189 Pneumonia, unspecified organism: Secondary | ICD-10-CM

## 2015-03-16 DIAGNOSIS — J9601 Acute respiratory failure with hypoxia: Secondary | ICD-10-CM | POA: Diagnosis present

## 2015-03-16 DIAGNOSIS — D61810 Antineoplastic chemotherapy induced pancytopenia: Secondary | ICD-10-CM

## 2015-03-16 DIAGNOSIS — L8990 Pressure ulcer of unspecified site, unspecified stage: Secondary | ICD-10-CM | POA: Diagnosis present

## 2015-03-16 DIAGNOSIS — T451X5A Adverse effect of antineoplastic and immunosuppressive drugs, initial encounter: Secondary | ICD-10-CM

## 2015-03-16 LAB — BLOOD GAS, ARTERIAL
Acid-Base Excess: 1.6 mmol/L (ref 0.0–2.0)
BICARBONATE: 25.9 meq/L — AB (ref 20.0–24.0)
DRAWN BY: 22223
O2 CONTENT: 8 L/min
O2 Saturation: 95.3 %
PCO2 ART: 39.1 mmHg (ref 35.0–45.0)
PH ART: 7.431 (ref 7.350–7.450)
Patient temperature: 37
TCO2: 15.1 mmol/L (ref 0–100)
pO2, Arterial: 81.5 mmHg (ref 80.0–100.0)

## 2015-03-16 LAB — CBC
HCT: 32.8 % — ABNORMAL LOW (ref 36.0–46.0)
Hemoglobin: 10.7 g/dL — ABNORMAL LOW (ref 12.0–15.0)
MCH: 28.8 pg (ref 26.0–34.0)
MCHC: 32.6 g/dL (ref 30.0–36.0)
MCV: 88.2 fL (ref 78.0–100.0)
PLATELETS: 119 10*3/uL — AB (ref 150–400)
RBC: 3.72 MIL/uL — AB (ref 3.87–5.11)
RDW: 16.7 % — ABNORMAL HIGH (ref 11.5–15.5)
WBC: 3.2 10*3/uL — ABNORMAL LOW (ref 4.0–10.5)

## 2015-03-16 LAB — COMPREHENSIVE METABOLIC PANEL
ALBUMIN: 2.2 g/dL — AB (ref 3.5–5.0)
ALK PHOS: 66 U/L (ref 38–126)
ALT: 21 U/L (ref 14–54)
ANION GAP: 5 (ref 5–15)
AST: 18 U/L (ref 15–41)
BUN: 9 mg/dL (ref 6–20)
CALCIUM: 7.8 mg/dL — AB (ref 8.9–10.3)
CO2: 25 mmol/L (ref 22–32)
CREATININE: 0.35 mg/dL — AB (ref 0.44–1.00)
Chloride: 106 mmol/L (ref 101–111)
GFR calc non Af Amer: >60 mL/min (ref >60)
Glucose, Bld: 153 mg/dL — ABNORMAL HIGH (ref 65–99)
Potassium: 3.7 mmol/L (ref 3.5–5.1)
SODIUM: 136 mmol/L (ref 135–145)
Total Bilirubin: 0.3 mg/dL (ref 0.3–1.2)
Total Protein: 4.8 g/dL — ABNORMAL LOW (ref 6.5–8.1)

## 2015-03-16 LAB — EXPECTORATED SPUTUM ASSESSMENT W REFEX TO RESP CULTURE

## 2015-03-16 LAB — GLUCOSE, CAPILLARY
Glucose-Capillary: 142 mg/dL — ABNORMAL HIGH (ref 65–99)
Glucose-Capillary: 141 mg/dL — ABNORMAL HIGH (ref 65–99)
Glucose-Capillary: 113 mg/dL — ABNORMAL HIGH (ref 65–99)

## 2015-03-16 LAB — EXPECTORATED SPUTUM ASSESSMENT W GRAM STAIN, RFLX TO RESP C

## 2015-03-16 LAB — LACTIC ACID, PLASMA: LACTIC ACID, VENOUS: 4.1 mmol/L — AB (ref 0.5–2.0)

## 2015-03-16 LAB — MRSA PCR SCREENING: MRSA by PCR: NEGATIVE

## 2015-03-16 MED ORDER — DEXTROSE 5 % IV SOLN
INTRAVENOUS | Status: AC
  Filled 2015-03-16: qty 2

## 2015-03-16 MED ORDER — CHLORHEXIDINE GLUCONATE 0.12 % MT SOLN
15.0000 mL | Freq: Two times a day (BID) | OROMUCOSAL | Status: DC
  Administered 2015-03-17 – 2015-03-18 (×2): 15 mL via OROMUCOSAL
  Filled 2015-03-16 (×3): qty 15

## 2015-03-16 MED ORDER — PANTOPRAZOLE SODIUM 40 MG IV SOLR
40.0000 mg | INTRAVENOUS | Status: DC
  Administered 2015-03-16 – 2015-03-18 (×3): 40 mg via INTRAVENOUS
  Filled 2015-03-16 (×3): qty 40

## 2015-03-16 MED ORDER — INSULIN ASPART 100 UNIT/ML ~~LOC~~ SOLN
0.0000 [IU] | Freq: Three times a day (TID) | SUBCUTANEOUS | Status: DC
  Administered 2015-03-16 (×2): 1 [IU] via SUBCUTANEOUS

## 2015-03-16 MED ORDER — FLUCONAZOLE IN SODIUM CHLORIDE 100-0.9 MG/50ML-% IV SOLN
100.0000 mg | INTRAVENOUS | Status: DC
  Administered 2015-03-16 – 2015-03-17 (×2): 100 mg via INTRAVENOUS
  Filled 2015-03-16 (×3): qty 50

## 2015-03-16 MED ORDER — MAGIC MOUTHWASH
5.0000 mL | Freq: Three times a day (TID) | ORAL | Status: DC
  Administered 2015-03-16 – 2015-03-18 (×6): 5 mL via ORAL
  Filled 2015-03-16 (×6): qty 5

## 2015-03-16 MED ORDER — LIDOCAINE VISCOUS 2 % MT SOLN
5.0000 mL | Freq: Three times a day (TID) | OROMUCOSAL | Status: DC
  Administered 2015-03-16 – 2015-03-18 (×7): 5 mL via OROMUCOSAL
  Filled 2015-03-16 (×6): qty 15

## 2015-03-16 MED ORDER — IPRATROPIUM-ALBUTEROL 0.5-2.5 (3) MG/3ML IN SOLN: 3.0000 mL | Freq: Four times a day (QID) | RESPIRATORY_TRACT | Status: DC | PRN

## 2015-03-16 MED ORDER — INSULIN ASPART 100 UNIT/ML ~~LOC~~ SOLN: 0.0000 [IU] | Freq: Every day | SUBCUTANEOUS | Status: DC

## 2015-03-16 MED ORDER — GUAIFENESIN ER 600 MG PO TB12
1200.0000 mg | ORAL_TABLET | Freq: Two times a day (BID) | ORAL | Status: DC
  Administered 2015-03-16 – 2015-03-17 (×3): 1200 mg via ORAL
  Filled 2015-03-16 (×3): qty 2

## 2015-03-16 MED ORDER — DEXAMETHASONE SODIUM PHOSPHATE 4 MG/ML IJ SOLN
1.0000 mg | INTRAMUSCULAR | Status: DC
  Administered 2015-03-16 – 2015-03-18 (×2): 1 mg via INTRAVENOUS
  Filled 2015-03-16 (×2): qty 1

## 2015-03-16 MED ORDER — VANCOMYCIN HCL IN DEXTROSE 750-5 MG/150ML-% IV SOLN
750.0000 mg | Freq: Two times a day (BID) | INTRAVENOUS | Status: DC
  Administered 2015-03-16 – 2015-03-17 (×4): 750 mg via INTRAVENOUS
  Filled 2015-03-16 (×7): qty 150

## 2015-03-16 MED ORDER — CETYLPYRIDINIUM CHLORIDE 0.05 % MT LIQD
7.0000 mL | Freq: Two times a day (BID) | OROMUCOSAL | Status: DC
  Administered 2015-03-16 – 2015-03-18 (×4): 7 mL via OROMUCOSAL

## 2015-03-16 MED ORDER — DEXTROSE 5 % IV SOLN
510.0000 mg | Freq: Three times a day (TID) | INTRAVENOUS | Status: DC
  Administered 2015-03-16 – 2015-03-18 (×6): 510 mg via INTRAVENOUS
  Filled 2015-03-16 (×7): qty 10.2

## 2015-03-16 MED ORDER — MAGIC MOUTHWASH W/LIDOCAINE: 5.0000 mL | Freq: Three times a day (TID) | ORAL | Status: DC

## 2015-03-16 MED ORDER — DEXTROSE 5 % IV SOLN
2.0000 g | Freq: Three times a day (TID) | INTRAVENOUS | Status: DC
  Administered 2015-03-16 – 2015-03-18 (×6): 2 g via INTRAVENOUS
  Filled 2015-03-16 (×10): qty 2

## 2015-03-16 NOTE — Consult Note (Signed)
Consult requested KG:[facility] hospitalists Consult requested for: respiratory failure  HPI: This is a 59 year old who had been discharged from hospital after an episode of hospital-acquired pneumonia. She started having trouble about 24 hours after being discharged had increased chest congestion increased respiratory distress altered mental status. She may well have aspirated because there is a history of choking on oral liquids. She had been on a nonrebreather mask on admission but is now on nasal cannula with O2 saturations in the mid 90s. She was felt to be septic on admission and has responded to fluid resuscitation by her blood pressure is still in the high 90s and low 100s although she is awake and alert. 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 She also has COPD at baseline and has a history of pulmonary emboli.  Past Medical History  Diagnosis Date  . Essential hypertension   . Anxiety   . Depression   . COPD (chronic obstructive pulmonary disease) ([place])   . GERD (gastroesophageal reflux disease)   . Headache(784.0)   . Arthritis   . Hx of adenomatous colonic polyps   . Emphysema   . Cataract     1 lens implaqnt right eye,intact on left eye  . Goiter   . Fibromyalgia   . Pulmonary emboli ([place]) 11/04/11    Right upper lobe and r lower lobe PE  . Lung nodule seen on imaging study 11/04/11 CT    56m LLL  . D[place]) [place] 12/09[place]  . Pulmonary embolism ([facility] 12/09/2011  . Hyperlipidemia   . History of radiation therapy 10/06/11    SRS 15Gy 180f brain  . UTI (lower urinary tract infection)   . Borderline diabetes mellitus   . Metastatic adenocarcinoma to brain [place]    Left frontoparietal region  . [place] [place]  . S/P radiation therapy 05/31/14 SRS    Brain  . PONV (postoperative nausea and vomiting)      Family History  Problem Relation Age of  Onset  . Asthma Mother   . Kidney failure Father   . Diabetes Sister   . Heart attack Sister   . Colon cancer Brother   . COPD Sister   . Aneurysm Paternal Grandmother     Brain  . Parkinsonism Maternal Uncle   . COPD Brother      Social History   Social History  . Marital Status: Married    Spouse Name: N/A  . Number of Children: 4  . Years of Education: N/A   Occupational History  . disabled     prev Time [employer] Social History Main Topics  . Smoking status: Former Smoker -- 1.00 packs/day for 40 years    Types: Cigarettes    Quit date: 04/06/2011  . Smokeless tobacco: Never Used  . Alcohol Use: No  . Drug Use: No  . Sexual Activity: Yes   Other Topics Concern  . None   Social History Narrative   Married, unemployed     ROS: She denies any chest pain. She has had some trouble swallowing. She's coughing up some sputum.    Objective: Vital signs in last 24 hours: Temp:  [97.4 F (36.3 C)-97.5 F (36.4 C)] 97.5 F (36.4 C) (12/11 0831) Pulse Rate:  [69-94] 69 (12/11 0700) Resp:  [12-22] 13 (12/11 0700) BP: (90-115)/(62-91) 106/76 mmHg (12/11 0700) SpO2:  [93 %-100 %] 99 % (  12/11 0700) Weight:  [66.225 kg (146 lb)-66.7 kg (147 lb 0.8 oz)] 66.7 kg (147 lb 0.8 oz) (12/11 0500) Weight change:  Last BM Date: 03/12/15  Intake/Output from previous day: 12/10 0701 - 12/11 0700 In: 422.9 [I.V.:422.9] Out: 1900 [Urine:1900]  PHYSICAL EXAM She is awake and alert. She is sitting up and eating full liquids. She has alopecia from treatment for her cancer. Her pupils react. Nose and throat show she does have some erythema and some blotches. Her neck is supple. Her chest shows diminished breath sounds and prolonged expiratory phase and some rales in both bases her heart is regular without gallop. Her abdomen soft without masses. She does not have any edema. Central nervous system examination is grossly intact  Lab Results: Basic Metabolic Panel:  Recent Labs   03/15/15 2100 03/16/15 0505  NA 135 136  K 3.4* 3.7  CL 101 106  CO2 26 25  GLUCOSE 157* 153*  BUN 12 9  CREATININE 0.40* 0.35*  CALCIUM 8.4* 7.8*   Liver Function Tests:  Recent Labs  03/15/15 2100 03/16/15 0505  AST 21 18  ALT 24 21  ALKPHOS 71 66  BILITOT 0.6 0.3  PROT 5.2* 4.8*  ALBUMIN 2.4* 2.2*   No results for input(s): LIPASE, AMYLASE in the last 72 hours. No results for input(s): AMMONIA in the last 72 hours. CBC:  Recent Labs  03/15/15 2100 03/16/15 0505  WBC 3.2* 3.2*  NEUTROABS 2.7  --   HGB 11.1* 10.7*  HCT 33.6* 32.8*  MCV 87.0 88.2  PLT 125* 119*   Cardiac Enzymes:  Recent Labs  03/15/15 2100  TROPONINI <0.03   BNP: No results for input(s): PROBNP in the last 72 hours. D-Dimer: No results for input(s): DDIMER in the last 72 hours. CBG: No results for input(s): GLUCAP in the last 72 hours. Hemoglobin A1C: No results for input(s): HGBA1C in the last 72 hours. Fasting Lipid Panel: No results for input(s): CHOL, HDL, LDLCALC, TRIG, CHOLHDL, LDLDIRECT in the last 72 hours. Thyroid Function Tests: No results for input(s): TSH, T4TOTAL, FREET4, T3FREE, THYROIDAB in the last 72 hours. Anemia Panel: No results for input(s): VITAMINB12, FOLATE, FERRITIN, TIBC, IRON, RETICCTPCT in the last 72 hours. Coagulation: No results for input(s): LABPROT, INR in the last 72 hours. Urine Drug Screen: Drugs of Abuse  No results found for: LABOPIA, COCAINSCRNUR, LABBENZ, AMPHETMU, THCU, LABBARB  Alcohol Level: No results for input(s): ETH in the last 72 hours. Urinalysis:  Recent Labs  03/15/15 2236  COLORURINE YELLOW  LABSPEC 1.015  PHURINE 6.0  GLUCOSEU NEGATIVE  HGBUR NEGATIVE  BILIRUBINUR NEGATIVE  KETONESUR TRACE*  PROTEINUR NEGATIVE  NITRITE NEGATIVE  LEUKOCYTESUR NEGATIVE   Misc. Labs:   ABGS:  Recent Labs  03/16/15 0625  PHART 7.431  PO2ART 81.5  TCO2 15.1  HCO3 25.9*     MICROBIOLOGY: Recent Results (from the past 240  hour(s))  Surgical pcr screen     Status: None   Collection Time: 03/07/15  6:16 AM  Result Value Ref Range Status   MRSA, PCR NEGATIVE NEGATIVE Final   Staphylococcus aureus NEGATIVE NEGATIVE Final    Comment:        The Xpert SA Assay (FDA approved for NASAL specimens in patients over 79 years of age), is one component of a comprehensive surveillance program.  Test performance has been validated by [facility] for patients greater than or equal to 33 year old. It is not intended to diagnose infection nor to guide  or monitor treatment.   KOH prep     Status: None   Collection Time: 03/07/15 12:37 PM  Result Value Ref Range Status   Specimen Description ESOPHAGUS  Final   Special Requests NONE  Final   KOH Prep FEW YEAST  Final   Report Status 03/07/2015 FINAL  Final  MRSA PCR Screening     Status: None   Collection Time: 03/16/15  2:30 AM  Result Value Ref Range Status   MRSA by PCR NEGATIVE NEGATIVE Final    Comment:        The GeneXpert MRSA Assay (FDA approved for NASAL specimens only), is one component of a comprehensive MRSA colonization surveillance program. It is not intended to diagnose MRSA infection nor to guide or monitor treatment for MRSA infections.   Culture, expectorated sputum-assessment     Status: None   Collection Time: 03/16/15  6:09 AM  Result Value Ref Range Status   Specimen Description SPU  Final   Special Requests NONE  Final   Sputum evaluation THIS SPECIMEN IS ACCEPTABLE FOR SPUTUM CULTURE  Final   Report Status 03/16/2015 FINAL  Final    Studies/Results: Dg Chest 2 View  03/15/2015  CLINICAL DATA:  Recently diagnosed with pneumonia. Severe shortness of breath and generalized weakness. Initial encounter. EXAM: CHEST  2 VIEW COMPARISON:  Chest radiograph performed 03/03/2015 FINDINGS: The lungs are well-aerated. Mild bibasilar opacities raise concern for mildly worsening multifocal pneumonia. There is no evidence of pleural effusion or  pneumothorax. The heart is normal in size; the mediastinal contour is within normal limits. No acute osseous abnormalities are seen. Clips are noted within the right upper quadrant, reflecting prior cholecystectomy. IMPRESSION: Mild bibasilar opacities are mildly worsened, raising concern for mildly worsening multifocal pneumonia. Electronically Signed   By: Garald Balding M.D.   On: 03/15/2015 21:38   Ct Head Wo Contrast  03/15/2015  CLINICAL DATA:  Frequent falls, confusion. EXAM: CT HEAD WITHOUT CONTRAST TECHNIQUE: Contiguous axial images were obtained from the base of the skull through the vertex without intravenous contrast. COMPARISON:  CT scan of May 04, 2014. FINDINGS: Status post left frontal craniotomy. Otherwise bony calvarium appears intact. Stable small focus of postoperative encephalomalacia in superior portion of left parietal cortex. No mass effect or midline shift is noted. Ventricular size is within normal limits. There is no evidence of mass lesion, hemorrhage or acute infarction. IMPRESSION: Status post left frontal craniotomy. No acute intracranial abnormality seen. Electronically Signed   By: Marijo Conception, M.D.   On: 03/15/2015 21:34   Ct Chest Wo Contrast  03/15/2015  CLINICAL DATA:  Shortness of breath and weakness EXAM: CT CHEST WITHOUT CONTRAST TECHNIQUE: Multidetector CT imaging of the chest was performed following the standard protocol without IV contrast. COMPARISON:  Chest x-ray from earlier in the same day FINDINGS: Lungs are well aerated bilaterally. Bilateral lower lobe infiltrate is noted left greater than right. Few small less than 5 mm nodules are noted as well. Thoracic inlet is within normal limits. Aortic calcifications are seen without aneurysmal dilatation. No hilar or mediastinal adenopathy is seen. The visualized upper abdomen is within normal limits. The gallbladder has been surgically removed. The bony structures are within normal limits. IMPRESSION:  Bilateral lower lobe infiltrates. A few small nodules are noted which may be postinflammatory as well. No other focal abnormality is seen. Electronically Signed   By: Inez Catalina M.D.   On: 03/15/2015 23:18    Medications:  Prior to Admission:  Prescriptions prior to admission  Medication Sig Dispense Refill Last Dose  . acyclovir (ZOVIRAX) 200 MG capsule Take 2 capsules (400 mg total) by mouth 3 (three) times daily. 36 capsule 0 03/15/2015 at Unknown time  . ALPRAZolam (XANAX) 0.5 MG tablet Take 0.5 mg by mouth 3 (three) times daily as needed for anxiety.    03/15/2015 at Unknown time  . amitriptyline (ELAVIL) 25 MG tablet Take 25 mg by mouth at bedtime.   03/14/2015 at Unknown time  . amLODipine (NORVASC) 10 MG tablet Take 10 mg by mouth daily.   03/15/2015 at Unknown time  . dexamethasone (DECADRON) 1 MG tablet Take 1 tablet (1 mg total) by mouth daily. 15 tablet 0 03/15/2015 at Unknown time  . fluconazole (DIFLUCAN) 100 MG tablet Take 1 tablet (100 mg total) by mouth daily. 11 tablet 0 03/15/2015 at Unknown time  . levothyroxine (SYNTHROID, LEVOTHROID) 137 MCG tablet Take 137 mcg by mouth daily before breakfast.   03/15/2015 at Unknown time  . magic mouthwash w/lidocaine SOLN Take 5 mLs by mouth 4 (four) times daily as needed for mouth pain. 400 mL 0 03/15/2015 at Unknown time  . Magnesium 250 MG TABS Take 1 tablet by mouth daily.   03/15/2015 at Unknown time  . morphine (MS CONTIN) 60 MG 12 hr tablet Take 60 mg by mouth every 12 (twelve) hours.   03/15/2015 at 1930  . ondansetron (ZOFRAN ODT) 8 MG disintegrating tablet Take 1 tablet (8 mg total) by mouth every 8 (eight) hours as needed for nausea or vomiting. 20 tablet 0 unknown  . Oxycodone HCl 20 MG TABS Take 1 tablet by mouth every 4 (four) hours as needed (pain).   03/15/2015 at morning  . pantoprazole (PROTONIX) 40 MG tablet Take 1 tablet (40 mg total) by mouth 2 (two) times daily. 60 tablet 0 03/15/2015 at Unknown time  . polyethylene  glycol (MIRALAX / GLYCOLAX) packet Take 17 g by mouth every other day. For constipation   03/15/2015 at Unknown time  . Simethicone (GAS-X EXTRA STRENGTH PO) Take 1 tablet by mouth daily as needed (gas relief).   unknown  . sucralfate (CARAFATE) 1 G tablet Take 1 tablet (1 g total) by mouth 4 (four) times daily -  with meals and at bedtime. 60 tablet 0 03/15/2015 at Unknown time  . Wound Dressings (SONAFINE) Apply 1 application topically daily.   03/15/2015 at Unknown time   Scheduled: . acyclovir  510 mg Intravenous Q8H  . antiseptic oral rinse  7 mL Mouth Rinse q12n4p  . cefTAZidime (FORTAZ)  IV  2 g Intravenous 3 times per day  . chlorhexidine  15 mL Mouth Rinse BID  . dexamethasone  1 mg Intravenous Q24H  . enoxaparin (LOVENOX) injection  40 mg Subcutaneous Q24H  . fluconazole (DIFLUCAN) IV  100 mg Intravenous Q24H  . insulin aspart  0-5 Units Subcutaneous QHS  . insulin aspart  0-9 Units Subcutaneous TID WC  . magic mouthwash  5 mL Oral TID   And  . lidocaine  5 mL Mouth/Throat TID  . pantoprazole (PROTONIX) IV  40 mg Intravenous Q24H  . sodium chloride  3 mL Intravenous Q12H  . vancomycin  750 mg Intravenous Q12H   Continuous: . sodium chloride 70 mL/hr at 03/16/15 0858   ELF:YBOFBPZ, ipratropium-albuterol, morphine injection, ondansetron **OR** ondansetron (ZOFRAN) IV  Assesment: She was admitted with acute hypoxic respiratory failure associated with healthcare associated pneumonia and COPD exacerbation. She may have some swallowing issues with  the history of choking on food at home. She also appears to have herpes and esophagitis and Candida esophagitis. She has protein calorie malnutrition. She was septic on admission but looks better with volume replacement. Principal Problem:   Acute respiratory failure with hypoxia (HCC) Active Problems:   Metastatic adenocarcinoma to brain, thyroid cancer   COPD with asthma ([place])   HCAP (healthcare-associated pneumonia)   Mucositis due  to radiation therapy   Hypothyroid   Protein-calorie malnutrition, severe   Mucosal abnormality of esophagus   Herpes simplex esophagitis   Pressure ulcer   Antineoplastic chemotherapy induced pancytopenia ([place])    Plan: She is on appropriate treatments now. If she has more trouble with hypotension she may need stress dose steroids which she is on a taper at this point and I will not change that. She may need speech evaluation if she continues to have trouble swallowing but I think at least some of this is treatment related.    LOS: 1 day   Lisseth Brazeau L 03/16/2015, 9:58 AM

## 2015-03-16 NOTE — Progress Notes (Signed)
Physician notified of Lactic acid , continue as ordered.

## 2015-03-16 NOTE — Progress Notes (Signed)
[facility] PROGRESS NOTE  Haley Lowe DJM:[id number] DOB: [birth year] DOA: 03/15/2015 PCP: [name], MD    Code Status: Full code Family Communication: Discussed with husband Disposition Plan: Discharge when clinically appropriate, likely in a few days.   Consultants:  None  Procedures:  None  Antibiotics:  Antifungal Diflucan 12/11>  Antiviral acyclovir 12/11>  Tressie Ellis 12/10>  Vancomycin 12/10>   HPI: Haley Lowe is a 59 y.o. female  This is a 59 year old lady who was discharged from the hospital on 03/13/15 after being treated for HCAP, herpes and candidal esophagitis, and mucositis.  She seemed to be doing well when she was discharged and was eating well. However, during the 24 hours prior to admission, she had been having more generalized weakness, chest congestion, and respiratory distress associated with altered mental status. Her daughter is in a nurse and apparently she checked the patient's oxygen saturations and they were in the 70s to 80s. On arrival to the ED, her pulse oximeter was reported as 70-80% on 6 L/m of nasal cannula. ABG revealed pH of 7.4, PCO2 of 38, and PO2 of 70.5. She was subsequently placed on a nonrebreather mask. Her chest x-ray revealed mild bibasilar opacities, mildly worsened and CT scan of the chest without contrast revealed bilateral lower lobes infiltrates and a few small nodules noted. She was admitted for further evaluation and management.    HPI/Subjective: Patient denies shortness of breath currently. She feels a little better. She has chronic pain when she swallows, but she feels that she could tolerate some full liquids this morning.  Objective: Filed Vitals:   03/16/15 0700 03/16/15 0831  BP: 106/76   Pulse: 69   Temp:  97.5 F (36.4 C)  Resp: 13    temperature 97.5. Oxygen saturation 99% on supplemental oxygen.  Intake/Output Summary (Last 24 hours) at 03/16/15 0836 Last data filed at 03/16/15 0200  Gross  per 24 hour  Intake 422.92 ml  Output   1900 ml  Net -1477.08 ml   Filed Weights   03/15/15 2007 03/16/15 0100 03/16/15 0500  Weight: 66.225 kg (146 lb) 66.7 kg (147 lb 0.8 oz) 66.7 kg (147 lb 0.8 oz)    Exam:   General:  Ill-appearing 59 year old woman in no acute distress.  Oropharynx reveals healing ulcer on the tongue; mild erythema of the posterior pharynx; no obvious white exudates.  Cardiovascular: S1, S2, no murmurs rubs or gallops.  Respiratory: Few crackles in the bases with coarse breath sounds in the upper lobes; breathing nonlabored at rest.  Abdomen: Positive bowel sounds, soft, nontender, nondistended.  Musculoskeletal/extremities: No pedal edema. No acute hot red joints.  Neurologic: She is alert and oriented 2. Cranial nerves II through XII are grossly intact.   Data Reviewed: Basic Metabolic Panel:  Recent Labs Lab 03/10/15 0856 03/11/15 0740 03/12/15 0632 03/15/15 2100 03/16/15 0505  NA 135 135 137 135 136  K 3.7 3.2* 3.6 3.4* 3.7  CL 105 105 103 101 106  CO2 20* '26 24 26 25  '$ GLUCOSE 136* 108* 102* 157* 153*  BUN '10 10 8 12 9  '$ CREATININE 0.36* 0.37* 0.39* 0.40* 0.35*  CALCIUM 8.3* 8.1* 8.4* 8.4* 7.8*   Liver Function Tests:  Recent Labs Lab 03/15/15 2100 03/16/15 0505  AST 21 18  ALT 24 21  ALKPHOS 71 66  BILITOT 0.6 0.3  PROT 5.2* 4.8*  ALBUMIN 2.4* 2.2*   No results for input(s): LIPASE, AMYLASE in the last 168 hours. No results for input(s): AMMONIA  in the last 168 hours. CBC:  Recent Labs Lab 03/10/15 0856 03/15/15 2100 03/16/15 0505  WBC 3.7* 3.2* 3.2*  NEUTROABS  --  2.7  --   HGB 15.7* 11.1* 10.7*  HCT 46.2* 33.6* 32.8*  MCV 86.5 87.0 88.2  PLT 113* 125* 119*   Cardiac Enzymes:  Recent Labs Lab 03/15/15 2100  TROPONINI <0.03   BNP (last 3 results)  Recent Labs  03/15/15 2100  BNP 81.0    ProBNP (last 3 results) No results for input(s): PROBNP in the last 8760 hours.  CBG: No results for input(s):  GLUCAP in the last 168 hours.  Recent Results (from the past 240 hour(s))  Surgical pcr screen     Status: None   Collection Time: 03/07/15  6:16 AM  Result Value Ref Range Status   MRSA, PCR NEGATIVE NEGATIVE Final   Staphylococcus aureus NEGATIVE NEGATIVE Final    Comment:        The Xpert SA Assay (FDA approved for NASAL specimens in patients over 62 years of age), is one component of a comprehensive surveillance program.  Test performance has been validated by [facility] for patients greater than or equal to 59 year old. It is not intended to diagnose infection nor to guide or monitor treatment.   KOH prep     Status: None   Collection Time: 03/07/15 12:37 PM  Result Value Ref Range Status   Specimen Description ESOPHAGUS  Final   Special Requests NONE  Final   KOH Prep FEW YEAST  Final   Report Status 03/07/2015 FINAL  Final  MRSA PCR Screening     Status: None   Collection Time: 03/16/15  2:30 AM  Result Value Ref Range Status   MRSA by PCR NEGATIVE NEGATIVE Final    Comment:        The GeneXpert MRSA Assay (FDA approved for NASAL specimens only), is one component of a comprehensive MRSA colonization surveillance program. It is not intended to diagnose MRSA infection nor to guide or monitor treatment for MRSA infections.      Studies: Dg Chest 2 View  03/15/2015  CLINICAL DATA:  Recently diagnosed with pneumonia. Severe shortness of breath and generalized weakness. Initial encounter. EXAM: CHEST  2 VIEW COMPARISON:  Chest radiograph performed 03/03/2015 FINDINGS: The lungs are well-aerated. Mild bibasilar opacities raise concern for mildly worsening multifocal pneumonia. There is no evidence of pleural effusion or pneumothorax. The heart is normal in size; the mediastinal contour is within normal limits. No acute osseous abnormalities are seen. Clips are noted within the right upper quadrant, reflecting prior cholecystectomy. IMPRESSION: Mild bibasilar opacities  are mildly worsened, raising concern for mildly worsening multifocal pneumonia. Electronically Signed   By: Garald Balding M.D.   On: 03/15/2015 21:38   Ct Head Wo Contrast  03/15/2015  CLINICAL DATA:  Frequent falls, confusion. EXAM: CT HEAD WITHOUT CONTRAST TECHNIQUE: Contiguous axial images were obtained from the base of the skull through the vertex without intravenous contrast. COMPARISON:  CT scan of May 04, 2014. FINDINGS: Status post left frontal craniotomy. Otherwise bony calvarium appears intact. Stable small focus of postoperative encephalomalacia in superior portion of left parietal cortex. No mass effect or midline shift is noted. Ventricular size is within normal limits. There is no evidence of mass lesion, hemorrhage or acute infarction. IMPRESSION: Status post left frontal craniotomy. No acute intracranial abnormality seen. Electronically Signed   By: Marijo Conception, M.D.   On: 03/15/2015 21:34  Ct Chest Wo Contrast  03/15/2015  CLINICAL DATA:  Shortness of breath and weakness EXAM: CT CHEST WITHOUT CONTRAST TECHNIQUE: Multidetector CT imaging of the chest was performed following the standard protocol without IV contrast. COMPARISON:  Chest x-ray from earlier in the same day FINDINGS: Lungs are well aerated bilaterally. Bilateral lower lobe infiltrate is noted left greater than right. Few small less than 5 mm nodules are noted as well. Thoracic inlet is within normal limits. Aortic calcifications are seen without aneurysmal dilatation. No hilar or mediastinal adenopathy is seen. The visualized upper abdomen is within normal limits. The gallbladder has been surgically removed. The bony structures are within normal limits. IMPRESSION: Bilateral lower lobe infiltrates. A few small nodules are noted which may be postinflammatory as well. No other focal abnormality is seen. Electronically Signed   By: Inez Catalina M.D.   On: 03/15/2015 23:18    Scheduled Meds: . antiseptic oral rinse  7  mL Mouth Rinse q12n4p  . cefTAZidime (FORTAZ)  IV  2 g Intravenous 3 times per day  . chlorhexidine  15 mL Mouth Rinse BID  . dexamethasone  2 mg Intravenous Q24H  . enoxaparin (LOVENOX) injection  40 mg Subcutaneous Q24H  . fluconazole (DIFLUCAN) IV  100 mg Intravenous Q24H  . sodium chloride  3 mL Intravenous Q12H  . vancomycin  750 mg Intravenous Q12H   Continuous Infusions: . 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 Acute respiratory failure with hypoxia. The patient was just discharged on 03/13/2015 for treatment of HC AP. During the previous hospital course, she was treated with broad spectrum IV antibiotics and transition to Levaquin for which she completed the course prior to hospital discharge. She returns with reported hypoxia and generalized weakness with altered mental status changes at home. -On admission, on a nonrebreather, her PO2 was 70.5. Follow-up ABG revealed a PO2 of 81.5 on 8 L. -On admission, her white blood cell count was not elevated and she was not febrile. -On admission, CT scan of her chest revealed bilateral lower lobe infiltrates. She did have bilateral lower lobe infiltrates on the x-ray during the previous admission. - She was started on vancomycin and Fortaz. We'll continue these for now, but could likely narrow therapy soon. -We'll check a TSH and B12 for further evaluation.  COPD with asthma. She has no wheezing on pulmonary exam, so we'll hold off on treatment dose steroid. Will order when  necessary DuoNeb  Odontophagia/dysphagia/esophageal stricture, secondary to esophageal candidiasis/herpes esophagitis, and radiation-induced mucositis. The patient was evaluated with an EGD by GI during the previous hospitalization and the results were consistent with the above stated. She was discharged on acyclovir and acyclovir, along with Protonix and Carafate. The total treatment of Diflucan is 21 days and for acyclovir 14-21 days. -We'll restart Diflucan and give IV. Will restart acyclovir and give IV. -We'll restart Protonix and give IV. Will restart Carafate and a day or 2. -We'll start Magic mouthwash with lidocaine. -We'll try full liquid diet.  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 Patient is followed by oncology at [facility] Christene Slates, M.D.). Chemotherapeutic agent, Lenvatinib is currently on hold from recommendation from the previous hospitalization, as it can cause mucositis. Patient is also on Decadron which was titrated down to 1 mg daily prior to discharge. -We'll change IV Decadron to 1 mg.   Pancytopenia. Patient has leukopenia, anemia, and thrombocytopenia, but not in the range that requires transfusion. We'll continue to monitor.  Mild hyperglycemia, likely steroid-induced. We'll start sliding scale NovoLog. Will order hemoglobin A1c.   Time spent: 40 minutes    [facility] Pager [phone]. If 7PM-7AM, please contact night-coverage at [email or web address], password [facility] 03/16/2015, 8:36 AM  LOS: 1 day

## 2015-03-17 ENCOUNTER — Inpatient Hospital Stay (HOSPITAL_COMMUNITY): Payer: Medicare Other

## 2015-03-17 DIAGNOSIS — E43 Unspecified severe protein-calorie malnutrition: Secondary | ICD-10-CM

## 2015-03-17 LAB — BASIC METABOLIC PANEL
ANION GAP: 5 (ref 5–15)
BUN: 6 mg/dL (ref 6–20)
CHLORIDE: 107 mmol/L (ref 101–111)
CO2: 27 mmol/L (ref 22–32)
Calcium: 7.8 mg/dL — ABNORMAL LOW (ref 8.9–10.3)
Creatinine, Ser: 0.4 mg/dL — ABNORMAL LOW (ref 0.44–1.00)
GFR calc non Af Amer: >60 mL/min (ref >60)
Glucose, Bld: 139 mg/dL — ABNORMAL HIGH (ref 65–99)
POTASSIUM: 3.6 mmol/L (ref 3.5–5.1)
SODIUM: 139 mmol/L (ref 135–145)

## 2015-03-17 LAB — CBC
HEMATOCRIT: 31.3 % — AB (ref 36.0–46.0)
HEMOGLOBIN: 10.4 g/dL — AB (ref 12.0–15.0)
MCH: 29.3 pg (ref 26.0–34.0)
MCHC: 33.2 g/dL (ref 30.0–36.0)
MCV: 88.2 fL (ref 78.0–100.0)
Platelets: 128 10*3/uL — ABNORMAL LOW (ref 150–400)
RBC: 3.55 MIL/uL — AB (ref 3.87–5.11)
RDW: 16.7 % — ABNORMAL HIGH (ref 11.5–15.5)
WBC: 2.9 10*3/uL — AB (ref 4.0–10.5)

## 2015-03-17 LAB — GLUCOSE, CAPILLARY
GLUCOSE-CAPILLARY: 92 mg/dL (ref 65–99)
GLUCOSE-CAPILLARY: 92 mg/dL (ref 65–99)
GLUCOSE-CAPILLARY: 112 mg/dL — AB (ref 65–99)
Glucose-Capillary: 79 mg/dL (ref 65–99)
Glucose-Capillary: 69 mg/dL (ref 65–99)

## 2015-03-17 LAB — VITAMIN B12: VITAMIN B 12: 1051 pg/mL — AB (ref 180–914)

## 2015-03-17 LAB — TSH: TSH: 0.819 u[IU]/mL (ref 0.350–4.500)

## 2015-03-17 MED ORDER — ENSURE ENLIVE PO LIQD
237.0000 mL | Freq: Two times a day (BID) | ORAL | Status: DC
  Administered 2015-03-18: 237 mL via ORAL

## 2015-03-17 MED ORDER — HYDROCOD POLST-CPM POLST ER 10-8 MG/5ML PO SUER: 5.0000 mL | Freq: Two times a day (BID) | ORAL | Status: DC | PRN

## 2015-03-17 MED ORDER — OXYCODONE HCL 5 MG PO TABS
10.0000 mg | ORAL_TABLET | ORAL | Status: DC | PRN
  Administered 2015-03-17 – 2015-03-18 (×4): 10 mg via ORAL
  Filled 2015-03-17 (×4): qty 2

## 2015-03-17 MED ORDER — ACYCLOVIR SODIUM 50 MG/ML IV SOLN
INTRAVENOUS | Status: AC
  Filled 2015-03-17: qty 20

## 2015-03-17 MED ORDER — MORPHINE SULFATE (PF) 2 MG/ML IV SOLN
2.0000 mg | INTRAVENOUS | Status: DC | PRN
  Administered 2015-03-17 – 2015-03-18 (×2): 2 mg via INTRAVENOUS
  Filled 2015-03-17 (×2): qty 1

## 2015-03-17 MED ORDER — GUAIFENESIN-DM 100-10 MG/5ML PO SYRP
5.0000 mL | ORAL_SOLUTION | ORAL | Status: DC | PRN
  Administered 2015-03-18 (×2): 5 mL via ORAL
  Filled 2015-03-17 (×3): qty 5

## 2015-03-17 MED ORDER — MAGIC MOUTHWASH
5.0000 mL | Freq: Once | ORAL | Status: AC
  Administered 2015-03-17: 5 mL via ORAL
  Filled 2015-03-17: qty 5

## 2015-03-17 MED ORDER — SUCRALFATE 1 GM/10ML PO SUSP
1.0000 g | Freq: Three times a day (TID) | ORAL | Status: DC
  Administered 2015-03-17 – 2015-03-18 (×5): 1 g via ORAL
  Filled 2015-03-17 (×7): qty 10

## 2015-03-17 NOTE — Progress Notes (Signed)
Initial Nutrition Assessment   INTERVENTION:  Ensure Enlive po BID, each supplement provides 350 kcal and 20 grams of protein    NUTRITION DIAGNOSIS: Inadequate oral intake related to dysphagia as evidenced by pt previous report; and ST evaluation   GOAL: Pt to meet >/= 90% of their estimated nutrition needs      MONITOR: Po intake, labs and wt trends     REASON FOR ASSESSMENT: Malnutrition Screen       ASSESSMENT: Pt has metastatic thyroid cancer with metastasis to brain. Severe malnutrition identified last admission. Pt evaluated by ST today and diet advanced to Dysphagia 2 with thin liquids.  Her husband is here and says she was doing well until Friday when  She developed increased shortness of breath and altered mental status. Pt says her mouth is feeling much better than when she here earlier in the month. She has been eating mashed potatoes, puddings and drinking Boost. She complains that she's having difficulty with water though. Today she ate very well for lunch: roast beef, mashed potatoes and green beans. She is also taking Ensure Enlive (chocolate) 75-100%.    Diet Order:  DIET DYS 2 Room service appropriate?: Yes; Fluid consistency:: Thin  Skin:   no concerns noted  Last BM:   12/11 watery    Height:   Ht Readings from Last 1 Encounters:  03/16/15 '5\' 3"'$  (1.6 m)    Weight:   Wt Readings from Last 1 Encounters:  03/16/15 147 lb 0.8 oz (66.7 kg)    Ideal Body Weight:   52.2 kg  BMI:  Body mass index is 26.05 kg/(m^2).  Estimated Nutritional Needs:   Kcal:   8811-0315  Protein:   80-90 gr  Fluid:   2.0 liters daily  EDUCATION NEEDS:  None identified today    Colman Cater MS,RD,CSG,LDN Office: [phone] Pager: [phone]

## 2015-03-17 NOTE — Evaluation (Signed)
Clinical/Bedside Swallow Evaluation Patient Details  Name: Haley Lowe MRN: [id number] Date of Birth: November 26, 1955  Today's Date: 03/17/2015 Time: SLP Start Time (ACUTE ONLY): 1100 SLP Stop Time (ACUTE ONLY): 1126 SLP Time Calculation (min) (ACUTE ONLY): 26 min  Past Medical History:  Past Medical History  Diagnosis Date  . Essential hypertension   . Anxiety   . Depression   . COPD (chronic obstructive pulmonary disease) ([place])   . GERD (gastroesophageal reflux disease)   . Headache(784.0)   . Arthritis   . Hx of adenomatous colonic polyps   . Emphysema   . Cataract     1 lens implaqnt right eye,intact on left eye  . Goiter   . Fibromyalgia   . Pulmonary emboli ([place]) 11/04/11    Right upper lobe and r lower lobe PE  . Lung nodule seen on imaging study 11/04/11 CT    33m LLL  . DVT (deep venous thrombosis) ([place] 12/09/2011  . Pulmonary embolism ([place] 12/09/2011  . Hyperlipidemia   . History of radiation therapy 10/06/11    SRS 15Gy 170f brain  . UTI (lower urinary tract infection)   . Borderline diabetes mellitus   . Metastatic adenocarcinoma to brain ([place]13    Left frontoparietal region  . Thyroid cancer ([facility]  . S/P radiation therapy 05/31/14 SRS    Brain  . PONV (postoperative nausea and vomiting)    Past Surgical History:  Past Surgical History  Procedure Laterality Date  . Abdominal hysterectomy  1996  . Cholecystectomy  2000  . Shoulder surgery Right 1998  . Eye surgery  2001  . Foot surgery Left 2005  . Craniotomy  09/15/2011    Procedure: CRANIOTOMY TUMOR EXCISION;  Surgeon: RoHosie SpangleMD;  [facility];  Service: Neurosurgery;  Laterality: N/A;  Craniotomy resection of tumor with stealth  . Cataract extraction  2011    With lens implant  . [facility]ide[facility]  10-29-2013    [facility]    . Video bronchoscopy with endobronchial ultrasound N/A 04/11/2014    Procedure: VIDEO BRONCHOSCOPY WITH  ENDOBRONCHIAL ULTRASOUND;  Surgeon: [facility] NakayamaMD;  [facility] Service: Thoracic;  Laterality: N/A;  . Esophagogastroduodenoscopy (egd) with propofol N/A 03/07/2015    Procedure: ESOPHAGOGASTRODUODENOSCOPY (EGD) WITH PROPOFOL;  Surgeon: RoDaneil DolinMD;  Location: [facility];  Service: Endoscopy;  Laterality: N/A;  . Esophageal biopsy  03/07/2015    Procedure: BIOPSY;  Surgeon: RoDaneil DolinMD;  Location: [facility];  Service: Endoscopy;;   HPI:  [name] a [age]ear old lady who was discharged from the hospital on 03/13/15 after being treated for HCAP, herpes and candidal esophagitis, and mucositis. She seemed to be doing well when she was discharged and was eating well. However, during the 24 hours prior to admission, she had been having more generalized weakness, chest congestion, and respiratory distress associated with altered mental status. Her daughter is in a nurse and apparently she checked the patient's oxygen saturations and they were in the 70s to 80s. On arrival to the ED, her pulse oximeter was reported as 70-80% on 6 L/m of nasal cannula. ABG revealed pH of 7.4, PCO2 of 38, and PO2 of 70.5. She was subsequently placed on a nonrebreather mask. Her chest x-ray revealed mild bibasilar opacities, mildly worsened and CT scan of the chest without contrast revealed bilateral lower lobes infiltrates and a few small nodules noted.  She was admitted for further evaluation and management. MD requested SLP for swallow evaluation. Pt had a barium swallow on 01/01/2015 which showed silent aspiration of contrast. During previous admission last week, she had EGD with dilation on 03/07/2015.   Assessment / Plan / Recommendation Clinical Impression  Ms. Bischoff was seen for barium swallow study on 01/01/2015 due to c/o cervical esophageal dsyphagia and study showed positive silent aspiration of contrast. Pt states that she does not recall being asked to make any changes given the  findings. She currently has bilateral left lobe infiltrates and endorses coughing when drinking water. She mainly drinks water and coffee and denies difficulty with coffee (likely due to taking small sips). She uses magic mouthwash 4x per day currently due to mucositis (herpes, esophagitis) s/p radiation therapy. Given known silent aspiration, current infiltrates, subjective c/o coughing with water, oral lesions, and hoarse vocal quality, recommend MBSS to identify appropriate textures/consistencies with appropriate strategies as needed. Pt is in agreement with plan. Will arrange for later today.     Aspiration Risk  Moderate aspiration risk    Diet Recommendation Dysphagia 2 (Fine chop);Thin liquid   Liquid Administration via: Cup Medication Administration: Crushed with puree Supervision: Patient able to self feed Compensations: Slow rate;Small sips/bites Postural Changes: Seated upright at 90 degrees;Remain upright for at least 30 minutes after po intake    Other  Recommendations Oral Care Recommendations: Oral care BID   Follow up Recommendations       Frequency and Duration            Prognosis Prognosis for Safe Diet Advancement: [place] Date of Onset: 03/14/15 HPI: [name] is a 59 year old lady who was discharged from the hospital on 03/13/15 after being treated for HCAP, herpes and candidal esophagitis, and mucositis. She seemed to be doing well when she was discharged and was eating well. However, during the 24 hours prior to admission, she had been having more generalized weakness, chest congestion, and respiratory distress associated with altered mental status. Her daughter is in a nurse and apparently she checked the patient's oxygen saturations and they were in the 70s to 80s. On arrival to the ED, her pulse oximeter was reported as 70-80% on 6 L/m of nasal cannula. ABG revealed pH of 7.4, PCO2 of 38, and PO2 of 70.5. She was subsequently placed  on a nonrebreather mask. Her chest x-ray revealed mild bibasilar opacities, mildly worsened and CT scan of the chest without contrast revealed bilateral lower lobes infiltrates and a few small nodules noted. She was admitted for further evaluation and management. MD requested SLP for swallow evaluation. Pt had a barium swallow on 01/01/2015 which showed silent aspiration of contrast. During previous admission last week, she had EGD with dilation on 03/07/2015. Type of Study: Bedside Swallow Evaluation Previous Swallow Assessment: Barium swallow 01/01/2015: silent aspiration of contrast Diet Prior to this Study: Dysphagia 2 (chopped);Thin liquids Temperature Spikes Noted: No Respiratory Status: Nasal cannula History of Recent Intubation: No Behavior/Cognition: Alert;Cooperative;Pleasant mood Oral Cavity Assessment: Lesions (tongue and possibly palate-healing) Oral Care Completed by SLP: Recent completion by staff Oral Cavity - Dentition: Adequate natural dentition Vision: Functional for self-feeding Self-Feeding Abilities: Able to feed self Patient Positioning: Upright in bed;Upright in chair Baseline Vocal Quality: Hoarse;Breathy Volitional Cough: Strong Volitional Swallow: Able to elicit    Oral/Motor/Sensory Function Overall Oral Motor/Sensory Function: Within functional limits (oral defensiveness (mild) due to lesions)   [employer]  chips: Not tested   Thin Liquid Thin Liquid: Within functional limits Presentation: Cup;Straw    Nectar Thick Nectar Thick Liquid: Not tested   Honey Thick Honey Thick Liquid: Not tested   Puree Puree: Not tested Other Comments: Plan for MBSS shortly   Solid Solid: Not tested      Thank you,  Genene Churn, [place]  Reigna Ruperto 03/17/2015,11:30 AM

## 2015-03-17 NOTE — Progress Notes (Signed)
Subjective: She says she feels better. Her throat is still sore. She is less short of breath.  Objective: Vital signs in last 24 hours: Temp:  [97.5 F (36.4 C)-98.2 F (36.8 C)] 97.8 F (36.6 C) (12/12 0400) Pulse Rate:  [66-99] 71 (12/12 0600) Resp:  [12-24] 12 (12/12 0600) BP: (91-148)/(67-89) 115/89 mmHg (12/12 0600) SpO2:  [92 %-98 %] 96 % (12/12 0600) Weight change:  Last BM Date: 03/12/15  Intake/Output from previous day: 12/11 0701 - 12/12 0700 In: 2826.2 [P.O.:240; I.V.:1926.2; IV Piggyback:660] Out: -   PHYSICAL EXAM General appearance: alert, cooperative and mild distress Resp: rhonchi bilaterally Cardio: regular rate and rhythm, S1, S2 normal, no murmur, click, rub or gallop GI: soft, non-tender; bowel sounds normal; no masses,  no organomegaly Extremities: extremities normal, atraumatic, no cyanosis or edema  Lab Results:  Results for orders placed or performed during the hospital encounter of 03/15/15 (from the past 48 hour(s))  CBC with Differential     Status: Abnormal   Collection Time: 03/15/15  9:00 PM  Result Value Ref Range   WBC 3.2 (L) 4.0 - 10.5 K/uL   RBC 3.86 (L) 3.87 - 5.11 MIL/uL   Hemoglobin 11.1 (L) 12.0 - 15.0 g/dL   HCT 33.6 (L) 36.0 - 46.0 %   MCV 87.0 78.0 - 100.0 fL   MCH 28.8 26.0 - 34.0 pg   MCHC 33.0 30.0 - 36.0 g/dL   RDW 16.8 (H) 11.5 - 15.5 %   Platelets 125 (L) 150 - 400 K/uL   Neutrophils Relative % 84 %   Neutro Abs 2.7 1.7 - 7.7 K/uL   Lymphocytes Relative 12 %   Lymphs Abs 0.4 (L) 0.7 - 4.0 K/uL   Monocytes Relative 4 %   Monocytes Absolute 0.1 0.1 - 1.0 K/uL   Eosinophils Relative 0 %   Eosinophils Absolute 0.0 0.0 - 0.7 K/uL   Basophils Relative 0 %   Basophils Absolute 0.0 0.0 - 0.1 K/uL  Comprehensive metabolic panel     Status: Abnormal   Collection Time: 03/15/15  9:00 PM  Result Value Ref Range   Sodium 135 135 - 145 mmol/L   Potassium 3.4 (L) 3.5 - 5.1 mmol/L   Chloride 101 101 - 111 mmol/L   CO2 26 22 -  32 mmol/L   Glucose, Bld 157 (H) 65 - 99 mg/dL   BUN 12 6 - 20 mg/dL   Creatinine, Ser 0.40 (L) 0.44 - 1.00 mg/dL   Calcium 8.4 (L) 8.9 - 10.3 mg/dL   Total Protein 5.2 (L) 6.5 - 8.1 g/dL   Albumin 2.4 (L) 3.5 - 5.0 g/dL   AST 21 15 - 41 U/L   ALT 24 14 - 54 U/L   Alkaline Phosphatase 71 38 - 126 U/L   Total Bilirubin 0.6 0.3 - 1.2 mg/dL   GFR calc non Af Amer >60 >60 mL/min   GFR calc Af Amer >60 >60 mL/min    Comment: (NOTE) The eGFR has been calculated using the CKD EPI equation. This calculation has not been validated in all clinical situations. eGFR's persistently <60 mL/min signify possible Chronic Kidney Disease.    Anion gap 8 5 - 15  Brain natriuretic peptide     Status: None   Collection Time: 03/15/15  9:00 PM  Result Value Ref Range   B Natriuretic Peptide 81.0 0.0 - 100.0 pg/mL  Lactic acid, plasma     Status: None   Collection Time: 03/15/15  9:00 PM  Result Value Ref Range   Lactic Acid, Venous 1.8 0.5 - 2.0 mmol/L  Troponin I     Status: None   Collection Time: 03/15/15  9:00 PM  Result Value Ref Range   Troponin I <0.03 <0.031 ng/mL    Comment:        NO INDICATION OF MYOCARDIAL INJURY.   Blood gas, arterial ([facility] & [facility] ONLY)     Status: Abnormal   Collection Time: 03/15/15  9:04 PM  Result Value Ref Range   FIO2 100.00    Delivery systems NON-REBREATHER OXYGEN MASK    pH, Arterial 7.432 7.350 - 7.450   pCO2 arterial 38.4 35.0 - 45.0 mmHg   pO2, Arterial 70.5 (L) 80.0 - 100.0 mmHg   Bicarbonate 25.6 (H) 20.0 - 24.0 mEq/L   Acid-Base Excess 1.3 0.0 - 2.0 mmol/L   O2 Saturation 93.1 %   Patient temperature 98.6    Collection site RADIAL    Drawn by [id number]    Sample type ARTERIAL    Allens test (pass/fail) PASS PASS  Urinalysis, Routine w reflex microscopic (not at [facility])     Status: Abnormal   Collection Time: 03/15/15 10:36 PM  Result Value Ref Range   Color, Urine YELLOW YELLOW   APPearance CLEAR CLEAR   Specific Gravity, Urine 1.015 1.005 - 1.030    pH 6.0 5.0 - 8.0   Glucose, UA NEGATIVE NEGATIVE mg/dL   Hgb urine dipstick NEGATIVE NEGATIVE   Bilirubin Urine NEGATIVE NEGATIVE   Ketones, ur TRACE (A) NEGATIVE mg/dL   Protein, ur NEGATIVE NEGATIVE mg/dL   Nitrite NEGATIVE NEGATIVE   Leukocytes, UA NEGATIVE NEGATIVE    Comment: MICROSCOPIC NOT DONE ON URINES WITH NEGATIVE PROTEIN, BLOOD, LEUKOCYTES, NITRITE, OR GLUCOSE <1000 mg/dL.  Lactic acid, plasma     Status: Abnormal   Collection Time: 03/16/15  1:01 AM  Result Value Ref Range   Lactic Acid, Venous 4.1 (HH) 0.5 - 2.0 mmol/L    Comment: CRITICAL RESULT CALLED TO, READ BACK BY AND VERIFIED WITH:  DANIEL,J @ 0211 ON 03/16/15 BY WOODIE,J   MRSA PCR Screening     Status: None   Collection Time: 03/16/15  2:30 AM  Result Value Ref Range   MRSA by PCR NEGATIVE NEGATIVE    Comment:        The GeneXpert MRSA Assay (FDA approved for NASAL specimens only), is one component of a comprehensive MRSA colonization surveillance program. It is not intended to diagnose MRSA infection nor to guide or monitor treatment for MRSA infections.   Comprehensive metabolic panel     Status: Abnormal   Collection Time: 03/16/15  5:05 AM  Result Value Ref Range   Sodium 136 135 - 145 mmol/L   Potassium 3.7 3.5 - 5.1 mmol/L   Chloride 106 101 - 111 mmol/L   CO2 25 22 - 32 mmol/L   Glucose, Bld 153 (H) 65 - 99 mg/dL   BUN 9 6 - 20 mg/dL   Creatinine, Ser 0.35 (L) 0.44 - 1.00 mg/dL   Calcium 7.8 (L) 8.9 - 10.3 mg/dL   Total Protein 4.8 (L) 6.5 - 8.1 g/dL   Albumin 2.2 (L) 3.5 - 5.0 g/dL   AST 18 15 - 41 U/L   ALT 21 14 - 54 U/L   Alkaline Phosphatase 66 38 - 126 U/L   Total Bilirubin 0.3 0.3 - 1.2 mg/dL   GFR calc non Af Amer >60 >60 mL/min   GFR calc Af Amer >60 >  60 mL/min    Comment: (NOTE) The eGFR has been calculated using the CKD EPI equation. This calculation has not been validated in all clinical situations. eGFR's persistently <60 mL/min signify possible Chronic  Kidney Disease.    Anion gap 5 5 - 15  CBC     Status: Abnormal   Collection Time: 03/16/15  5:05 AM  Result Value Ref Range   WBC 3.2 (L) 4.0 - 10.5 K/uL   RBC 3.72 (L) 3.87 - 5.11 MIL/uL   Hemoglobin 10.7 (L) 12.0 - 15.0 g/dL   HCT 32.8 (L) 36.0 - 46.0 %   MCV 88.2 78.0 - 100.0 fL   MCH 28.8 26.0 - 34.0 pg   MCHC 32.6 30.0 - 36.0 g/dL   RDW 16.7 (H) 11.5 - 15.5 %   Platelets 119 (L) 150 - 400 K/uL    Comment: SPECIMEN CHECKED FOR CLOTS PLATELET COUNT CONFIRMED BY SMEAR   Culture, respiratory (NON-Expectorated)     Status: None (Preliminary result)   Collection Time: 03/16/15  6:07 AM  Result Value Ref Range   Specimen Description SPU    Special Requests NONE    Gram Stain      THIS SPECIMEN IS ACCEPTABLE FOR SPUTUM CULTURE ABUNDANT WBC PRESENT, PREDOMINANTLY PMN MODERATE SQUAMOUS EPITHELIAL CELLS PRESENT ABUNDANT GRAM POSITIVE COCCI IN PAIRS MODERATE GRAM POSITIVE RODS    Culture PENDING    Report Status PENDING   Culture, expectorated sputum-assessment     Status: None   Collection Time: 03/16/15  6:09 AM  Result Value Ref Range   Specimen Description SPU    Special Requests NONE    Sputum evaluation THIS SPECIMEN IS ACCEPTABLE FOR SPUTUM CULTURE    Report Status 03/16/2015 FINAL   Blood gas, arterial     Status: Abnormal   Collection Time: 03/16/15  6:25 AM  Result Value Ref Range   O2 Content 8.0 L/min   Delivery systems HI FLOW NASAL CANNULA    pH, Arterial 7.431 7.350 - 7.450   pCO2 arterial 39.1 35.0 - 45.0 mmHg   pO2, Arterial 81.5 80.0 - 100.0 mmHg   Bicarbonate 25.9 (H) 20.0 - 24.0 mEq/L   TCO2 15.1 0 - 100 mmol/L   Acid-Base Excess 1.6 0.0 - 2.0 mmol/L   O2 Saturation 95.3 %   Patient temperature 37.0    Collection site RIGHT RADIAL    Drawn by [id number]    Sample type ARTERIAL    Allens test (pass/fail) PASS PASS  Glucose, capillary     Status: Abnormal   Collection Time: 03/16/15 11:44 AM  Result Value Ref Range   Glucose-Capillary 142 (H) 65 - 99  mg/dL  Glucose, capillary     Status: Abnormal   Collection Time: 03/16/15  4:06 PM  Result Value Ref Range   Glucose-Capillary 141 (H) 65 - 99 mg/dL   Comment 1 Document in Chart   Glucose, capillary     Status: Abnormal   Collection Time: 03/16/15  9:18 PM  Result Value Ref Range   Glucose-Capillary 113 (H) 65 - 99 mg/dL   Comment 1 Document in Chart   CBC     Status: Abnormal   Collection Time: 03/17/15  4:17 AM  Result Value Ref Range   WBC 2.9 (L) 4.0 - 10.5 K/uL   RBC 3.55 (L) 3.87 - 5.11 MIL/uL   Hemoglobin 10.4 (L) 12.0 - 15.0 g/dL   HCT 31.3 (L) 36.0 - 46.0 %   MCV 88.2 78.0 - 100.0 fL  MCH 29.3 26.0 - 34.0 pg   MCHC 33.2 30.0 - 36.0 g/dL   RDW 16.7 (H) 11.5 - 15.5 %   Platelets 128 (L) 150 - 400 K/uL  Basic metabolic panel     Status: Abnormal   Collection Time: 03/17/15  4:17 AM  Result Value Ref Range   Sodium 139 135 - 145 mmol/L   Potassium 3.6 3.5 - 5.1 mmol/L   Chloride 107 101 - 111 mmol/L   CO2 27 22 - 32 mmol/L   Glucose, Bld 139 (H) 65 - 99 mg/dL   BUN 6 6 - 20 mg/dL   Creatinine, Ser 0.40 (L) 0.44 - 1.00 mg/dL   Calcium 7.8 (L) 8.9 - 10.3 mg/dL   GFR calc non Af Amer >60 >60 mL/min   GFR calc Af Amer >60 >60 mL/min    Comment: (NOTE) The eGFR has been calculated using the CKD EPI equation. This calculation has not been validated in all clinical situations. eGFR's persistently <60 mL/min signify possible Chronic Kidney Disease.    Anion gap 5 5 - 15  TSH     Status: None   Collection Time: 03/17/15  4:17 AM  Result Value Ref Range   TSH 0.819 0.350 - 4.500 uIU/mL    ABGS  Recent Labs  03/16/15 0625  PHART 7.431  PO2ART 81.5  TCO2 15.1  HCO3 25.9*   CULTURES Recent Results (from the past 240 hour(s))  KOH prep     Status: None   Collection Time: 03/07/15 12:37 PM  Result Value Ref Range Status   Specimen Description ESOPHAGUS  Final   Special Requests NONE  Final   KOH Prep FEW YEAST  Final   Report Status 03/07/2015 FINAL  Final   MRSA PCR Screening     Status: None   Collection Time: 03/16/15  2:30 AM  Result Value Ref Range Status   MRSA by PCR NEGATIVE NEGATIVE Final    Comment:        The GeneXpert MRSA Assay (FDA approved for NASAL specimens only), is one component of a comprehensive MRSA colonization surveillance program. It is not intended to diagnose MRSA infection nor to guide or monitor treatment for MRSA infections.   Culture, respiratory (NON-Expectorated)     Status: None (Preliminary result)   Collection Time: 03/16/15  6:07 AM  Result Value Ref Range Status   Specimen Description SPU  Final   Special Requests NONE  Final   Gram Stain   Final    THIS SPECIMEN IS ACCEPTABLE FOR SPUTUM CULTURE ABUNDANT WBC PRESENT, PREDOMINANTLY PMN MODERATE SQUAMOUS EPITHELIAL CELLS PRESENT ABUNDANT GRAM POSITIVE COCCI IN PAIRS MODERATE GRAM POSITIVE RODS    Culture PENDING  Incomplete   Report Status PENDING  Incomplete  Culture, expectorated sputum-assessment     Status: None   Collection Time: 03/16/15  6:09 AM  Result Value Ref Range Status   Specimen Description SPU  Final   Special Requests NONE  Final   Sputum evaluation THIS SPECIMEN IS ACCEPTABLE FOR SPUTUM CULTURE  Final   Report Status 03/16/2015 FINAL  Final   Studies/Results: Dg Chest 2 View  03/15/2015  CLINICAL DATA:  Recently diagnosed with pneumonia. Severe shortness of breath and generalized weakness. Initial encounter. EXAM: CHEST  2 VIEW COMPARISON:  Chest radiograph performed 03/03/2015 FINDINGS: The lungs are well-aerated. Mild bibasilar opacities raise concern for mildly worsening multifocal pneumonia. There is no evidence of pleural effusion or pneumothorax. The heart is normal in size; the mediastinal  contour is within normal limits. No acute osseous abnormalities are seen. Clips are noted within the right upper quadrant, reflecting prior cholecystectomy. IMPRESSION: Mild bibasilar opacities are mildly worsened, raising concern  for mildly worsening multifocal pneumonia. Electronically Signed   By: Garald Balding M.D.   On: 03/15/2015 21:38   Ct Head Wo Contrast  03/15/2015  CLINICAL DATA:  Frequent falls, confusion. EXAM: CT HEAD WITHOUT CONTRAST TECHNIQUE: Contiguous axial images were obtained from the base of the skull through the vertex without intravenous contrast. COMPARISON:  CT scan of May 04, 2014. FINDINGS: Status post left frontal craniotomy. Otherwise bony calvarium appears intact. Stable small focus of postoperative encephalomalacia in superior portion of left parietal cortex. No mass effect or midline shift is noted. Ventricular size is within normal limits. There is no evidence of mass lesion, hemorrhage or acute infarction. IMPRESSION: Status post left frontal craniotomy. No acute intracranial abnormality seen. Electronically Signed   By: Marijo Conception, M.D.   On: 03/15/2015 21:34   Ct Chest Wo Contrast  03/15/2015  CLINICAL DATA:  Shortness of breath and weakness EXAM: CT CHEST WITHOUT CONTRAST TECHNIQUE: Multidetector CT imaging of the chest was performed following the standard protocol without IV contrast. COMPARISON:  Chest x-ray from earlier in the same day FINDINGS: Lungs are well aerated bilaterally. Bilateral lower lobe infiltrate is noted left greater than right. Few small less than 5 mm nodules are noted as well. Thoracic inlet is within normal limits. Aortic calcifications are seen without aneurysmal dilatation. No hilar or mediastinal adenopathy is seen. The visualized upper abdomen is within normal limits. The gallbladder has been surgically removed. The bony structures are within normal limits. IMPRESSION: Bilateral lower lobe infiltrates. A few small nodules are noted which may be postinflammatory as well. No other focal abnormality is seen. Electronically Signed   By: Inez Catalina M.D.   On: 03/15/2015 23:18    Medications:  Prior to Admission:  Prescriptions prior to admission  Medication  Sig Dispense Refill Last Dose  . acyclovir (ZOVIRAX) 200 MG capsule Take 2 capsules (400 mg total) by mouth 3 (three) times daily. 36 capsule 0 03/15/2015 at Unknown time  . ALPRAZolam (XANAX) 0.5 MG tablet Take 0.5 mg by mouth 3 (three) times daily as needed for anxiety.    03/15/2015 at Unknown time  . amitriptyline (ELAVIL) 25 MG tablet Take 25 mg by mouth at bedtime.   03/14/2015 at Unknown time  . amLODipine (NORVASC) 10 MG tablet Take 10 mg by mouth daily.   03/15/2015 at Unknown time  . dexamethasone (DECADRON) 1 MG tablet Take 1 tablet (1 mg total) by mouth daily. 15 tablet 0 03/15/2015 at Unknown time  . fluconazole (DIFLUCAN) 100 MG tablet Take 1 tablet (100 mg total) by mouth daily. 11 tablet 0 03/15/2015 at Unknown time  . levothyroxine (SYNTHROID, LEVOTHROID) 137 MCG tablet Take 137 mcg by mouth daily before breakfast.   03/15/2015 at Unknown time  . magic mouthwash w/lidocaine SOLN Take 5 mLs by mouth 4 (four) times daily as needed for mouth pain. 400 mL 0 03/15/2015 at Unknown time  . Magnesium 250 MG TABS Take 1 tablet by mouth daily.   03/15/2015 at Unknown time  . morphine (MS CONTIN) 60 MG 12 hr tablet Take 60 mg by mouth every 12 (twelve) hours.   03/15/2015 at 1930  . ondansetron (ZOFRAN ODT) 8 MG disintegrating tablet Take 1 tablet (8 mg total) by mouth every 8 (eight) hours as needed for nausea  or vomiting. 20 tablet 0 unknown  . Oxycodone HCl 20 MG TABS Take 1 tablet by mouth every 4 (four) hours as needed (pain).   03/15/2015 at morning  . pantoprazole (PROTONIX) 40 MG tablet Take 1 tablet (40 mg total) by mouth 2 (two) times daily. 60 tablet 0 03/15/2015 at Unknown time  . polyethylene glycol (MIRALAX / GLYCOLAX) packet Take 17 g by mouth every other day. For constipation   03/15/2015 at Unknown time  . Simethicone (GAS-X EXTRA STRENGTH PO) Take 1 tablet by mouth daily as needed (gas relief).   unknown  . sucralfate (CARAFATE) 1 G tablet Take 1 tablet (1 g total) by mouth 4  (four) times daily -  with meals and at bedtime. 60 tablet 0 03/15/2015 at Unknown time  . Wound Dressings (SONAFINE) Apply 1 application topically daily.   03/15/2015 at Unknown time   Scheduled: . acyclovir  510 mg Intravenous Q8H  . antiseptic oral rinse  7 mL Mouth Rinse q12n4p  . cefTAZidime (FORTAZ)  IV  2 g Intravenous 3 times per day  . chlorhexidine  15 mL Mouth Rinse BID  . dexamethasone  1 mg Intravenous Q24H  . enoxaparin (LOVENOX) injection  40 mg Subcutaneous Q24H  . fluconazole (DIFLUCAN) IV  100 mg Intravenous Q24H  . guaiFENesin  1,200 mg Oral BID  . insulin aspart  0-5 Units Subcutaneous QHS  . insulin aspart  0-9 Units Subcutaneous TID WC  . magic mouthwash  5 mL Oral TID   And  . lidocaine  5 mL Mouth/Throat TID  . pantoprazole (PROTONIX) IV  40 mg Intravenous Q24H  . sodium chloride  3 mL Intravenous Q12H  . vancomycin  750 mg Intravenous Q12H   Continuous: . sodium chloride 70 mL/hr at 03/17/15 0600   JWJ:XBJYNWG, ipratropium-albuterol, morphine injection, ondansetron **OR** ondansetron (ZOFRAN) IV  Assesment: She was admitted with acute hypoxic respiratory failure and has healthcare associated pneumonia. She had been in the hospital was discharged and then deteriorated at home. She is feeling better now. She has difficulty swallowing that is probably related to her treatment for cancer. 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 Principal Problem:   Acute respiratory failure with hypoxia (HCC) Active Problems:   Metastatic adenocarcinoma to brain, thyroid cancer   COPD with asthma ([place])   HCAP (healthcare-associated pneumonia)   Mucositis due to radiation therapy   Hypothyroid   Protein-calorie malnutrition, severe   Mucosal abnormality of esophagus   Herpes simplex esophagitis   Pressure ulcer   Antineoplastic chemotherapy induced pancytopenia ([place])    Plan: Continue current treatments. She does seem to be improving. She may  need speech consultation. I think it's likely that she aspirated at home considering the history    LOS: 2 days   Jayant Kriz L 03/17/2015, 7:38 AM

## 2015-03-17 NOTE — Progress Notes (Signed)
Speech Pathology  MBSS completed this date. Full results to follow. Recommend D3/mech soft and thin liquids with the following precautions: Small sips; clear throat and repeat swallow after liquids. Cue pt to "swallow hard".  Thank you,  Genene Churn, CCC-SLP [phone]

## 2015-03-17 NOTE — Progress Notes (Signed)
[facility] PROGRESS NOTE  Haley Lowe HGD:[id number] DOB: 08/12/1955 DOA: 03/15/2015 PCP: [name], MD    Code Status: Full code Family Communication: Discussed with husband Disposition Plan: Discharge when clinically appropriate, likely in a 1-2 days.   Consultants:  Pulmonary, Dr. Luan Pulling  Procedures:  None  Antibiotics:  Antifungal Diflucan 12/11>  Antiviral acyclovir 12/11>  Tressie Ellis 12/10>  Vancomycin 12/10>   HPI: Haley Lowe is a 59 y.o. female  This is a 59 year old lady who was discharged from the hospital on 03/13/15 after being treated for HCAP, herpes and candidal esophagitis, and mucositis.  She seemed to be doing well when she was discharged and was eating well. However, during the 24 hours prior to admission, she had been having more generalized weakness, chest congestion, and respiratory distress associated with altered mental status. Her daughter is in a nurse and apparently she checked the patient's oxygen saturations and they were in the 70s to 80s. On arrival to the ED, her pulse oximeter was reported as 70-80% on 6 L/m of nasal cannula. ABG revealed pH of 7.4, PCO2 of 38, and PO2 of 70.5. She was subsequently placed on a nonrebreather mask. Her chest x-ray revealed mild bibasilar opacities, mildly worsened and CT scan of the chest without contrast revealed bilateral lower lobes infiltrates and a few small nodules noted. She was admitted for further evaluation and management.    HPI/Subjective: Patient says that she feels better this morning. She denies shortness of breath at rest. She still has a "sore" mouth.  Objective: Filed Vitals:   03/17/15 0600 03/17/15 0808  BP: 115/89   Pulse: 71   Temp:  96.9 F (36.1 C)  Resp: 12    temperature 96.9. Oxygen saturation 96% on supplemental oxygen.  Intake/Output Summary (Last 24 hours) at 03/17/15 0839 Last data filed at 03/17/15 0600  Gross per 24 hour  Intake 2826.17 ml  Output       0 ml  Net 2826.17 ml   Filed Weights   03/15/15 2007 03/16/15 0100 03/16/15 0500  Weight: 66.225 kg (146 lb) 66.7 kg (147 lb 0.8 oz) 66.7 kg (147 lb 0.8 oz)    Exam:   General:  59 year old woman in no acute distress. She looks better.  Oropharynx reveals healing ulcer on the tongue; mild erythema of the posterior pharynx; no obvious white exudates.  Cardiovascular: S1, S2, no murmurs rubs or gallops.  Respiratory: Few crackles in the bases with coarse breath sounds in the upper lobes; breathing nonlabored at rest.  Abdomen: Positive bowel sounds, soft, nontender, nondistended.  Musculoskeletal/extremities: No pedal edema. No acute hot red joints.  Neurologic: She is alert and oriented 2. Cranial nerves II through XII are grossly intact.   Data Reviewed: Basic Metabolic Panel:  Recent Labs Lab 03/11/15 0740 03/12/15 0632 03/15/15 2100 03/16/15 0505 03/17/15 0417  NA 135 137 135 136 139  K 3.2* 3.6 3.4* 3.7 3.6  CL 105 103 101 106 107  CO2 '26 24 26 25 27  '$ GLUCOSE 108* 102* 157* 153* 139*  BUN '10 8 12 9 6  '$ CREATININE 0.37* 0.39* 0.40* 0.35* 0.40*  CALCIUM 8.1* 8.4* 8.4* 7.8* 7.8*   Liver Function Tests:  Recent Labs Lab 03/15/15 2100 03/16/15 0505  AST 21 18  ALT 24 21  ALKPHOS 71 66  BILITOT 0.6 0.3  PROT 5.2* 4.8*  ALBUMIN 2.4* 2.2*   No results for input(s): LIPASE, AMYLASE in the last 168 hours. No results for input(s): AMMONIA in the  last 168 hours. CBC:  Recent Labs Lab 03/10/15 0856 03/15/15 2100 03/16/15 0505 03/17/15 0417  WBC 3.7* 3.2* 3.2* 2.9*  NEUTROABS  --  2.7  --   --   HGB 15.7* 11.1* 10.7* 10.4*  HCT 46.2* 33.6* 32.8* 31.3*  MCV 86.5 87.0 88.2 88.2  PLT 113* 125* 119* 128*   Cardiac Enzymes:  Recent Labs Lab 03/15/15 2100  TROPONINI <0.03   BNP (last 3 results)  Recent Labs  03/15/15 2100  BNP 81.0    ProBNP (last 3 results) No results for input(s): PROBNP in the last 8760 hours.  CBG:  Recent Labs Lab  03/16/15 1144 03/16/15 1606 03/16/15 2118 03/17/15 0749  GLUCAP 142* 141* 113* 92    Recent Results (from the past 240 hour(s))  KOH prep     Status: None   Collection Time: 03/07/15 12:37 PM  Result Value Ref Range Status   Specimen Description ESOPHAGUS  Final   Special Requests NONE  Final   KOH Prep FEW YEAST  Final   Report Status 03/07/2015 FINAL  Final  MRSA PCR Screening     Status: None   Collection Time: 03/16/15  2:30 AM  Result Value Ref Range Status   MRSA by PCR NEGATIVE NEGATIVE Final    Comment:        The GeneXpert MRSA Assay (FDA approved for NASAL specimens only), is one component of a comprehensive MRSA colonization surveillance program. It is not intended to diagnose MRSA infection nor to guide or monitor treatment for MRSA infections.   Culture, respiratory (NON-Expectorated)     Status: None (Preliminary result)   Collection Time: 03/16/15  6:07 AM  Result Value Ref Range Status   Specimen Description SPU  Final   Special Requests NONE  Final   Gram Stain   Final    THIS SPECIMEN IS ACCEPTABLE FOR SPUTUM CULTURE ABUNDANT WBC PRESENT, PREDOMINANTLY PMN MODERATE SQUAMOUS EPITHELIAL CELLS PRESENT ABUNDANT GRAM POSITIVE COCCI IN PAIRS MODERATE GRAM POSITIVE RODS    Culture PENDING  Incomplete   Report Status PENDING  Incomplete  Culture, expectorated sputum-assessment     Status: None   Collection Time: 03/16/15  6:09 AM  Result Value Ref Range Status   Specimen Description SPU  Final   Special Requests NONE  Final   Sputum evaluation THIS SPECIMEN IS ACCEPTABLE FOR SPUTUM CULTURE  Final   Report Status 03/16/2015 FINAL  Final     Studies: Dg Chest 2 View  03/15/2015  CLINICAL DATA:  Recently diagnosed with pneumonia. Severe shortness of breath and generalized weakness. Initial encounter. EXAM: CHEST  2 VIEW COMPARISON:  Chest radiograph performed 03/03/2015 FINDINGS: The lungs are well-aerated. Mild bibasilar opacities raise concern for  mildly worsening multifocal pneumonia. There is no evidence of pleural effusion or pneumothorax. The heart is normal in size; the mediastinal contour is within normal limits. No acute osseous abnormalities are seen. Clips are noted within the right upper quadrant, reflecting prior cholecystectomy. IMPRESSION: Mild bibasilar opacities are mildly worsened, raising concern for mildly worsening multifocal pneumonia. Electronically Signed   By: Garald Balding M.D.   On: 03/15/2015 21:38   Ct Head Wo Contrast  03/15/2015  CLINICAL DATA:  Frequent falls, confusion. EXAM: CT HEAD WITHOUT CONTRAST TECHNIQUE: Contiguous axial images were obtained from the base of the skull through the vertex without intravenous contrast. COMPARISON:  CT scan of May 04, 2014. FINDINGS: Status post left frontal craniotomy. Otherwise bony calvarium appears intact. Stable small focus of  postoperative encephalomalacia in superior portion of left parietal cortex. No mass effect or midline shift is noted. Ventricular size is within normal limits. There is no evidence of mass lesion, hemorrhage or acute infarction. IMPRESSION: Status post left frontal craniotomy. No acute intracranial abnormality seen. Electronically Signed   By: Marijo Conception, M.D.   On: 03/15/2015 21:34   Ct Chest Wo Contrast  03/15/2015  CLINICAL DATA:  Shortness of breath and weakness EXAM: CT CHEST WITHOUT CONTRAST TECHNIQUE: Multidetector CT imaging of the chest was performed following the standard protocol without IV contrast. COMPARISON:  Chest x-ray from earlier in the same day FINDINGS: Lungs are well aerated bilaterally. Bilateral lower lobe infiltrate is noted left greater than right. Few small less than 5 mm nodules are noted as well. Thoracic inlet is within normal limits. Aortic calcifications are seen without aneurysmal dilatation. No hilar or mediastinal adenopathy is seen. The visualized upper abdomen is within normal limits. The gallbladder has been  surgically removed. The bony structures are within normal limits. IMPRESSION: Bilateral lower lobe infiltrates. A few small nodules are noted which may be postinflammatory as well. No other focal abnormality is seen. Electronically Signed   By: Inez Catalina M.D.   On: 03/15/2015 23:18    Scheduled Meds: . acyclovir  510 mg Intravenous Q8H  . antiseptic oral rinse  7 mL Mouth Rinse q12n4p  . cefTAZidime (FORTAZ)  IV  2 g Intravenous 3 times per day  . chlorhexidine  15 mL Mouth Rinse BID  . dexamethasone  1 mg Intravenous Q24H  . enoxaparin (LOVENOX) injection  40 mg Subcutaneous Q24H  . fluconazole (DIFLUCAN) IV  100 mg Intravenous Q24H  . guaiFENesin  1,200 mg Oral BID  . insulin aspart  0-5 Units Subcutaneous QHS  . insulin aspart  0-9 Units Subcutaneous TID WC  . magic mouthwash  5 mL Oral TID   And  . lidocaine  5 mL Mouth/Throat TID  . pantoprazole (PROTONIX) IV  40 mg Intravenous Q24H  . sodium chloride  3 mL Intravenous Q12H  . vancomycin  750 mg Intravenous Q12H   Continuous Infusions: . 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 Acute respiratory failure with hypoxia, presumably secondary to persistent HCAP. The patient was just discharged on 03/13/2015 for treatment of HCAP. During the previous hospital course, she was treated with broad spectrum IV antibiotics and transitioned to Levaquin for which she completed the course prior to hospital discharge. She returned with reported hypoxia and generalized weakness  with altered mental status changes at home. -On admission, on a nonrebreather, her PO2 was 70.5. Follow-up ABG revealed a PO2 of 81.5 on 8 L. -On admission, her white blood cell count was not elevated and she was not febrile. -On admission, CT scan of her chest revealed bilateral lower lobe infiltrates. She did have bilateral lower lobe infiltrates on the x-ray during the previous hospitalization. - She was started on vancomycin and Fortaz. We'll continue these for now, but could likely narrow therapy soon. [facility] consult speech therapy for  evaluation of possible aspiration. -Her TSH was within normal limits. Vitamin B12 level pending.  COPD with asthma. She has no wheezing on pulmonary exam, so we'll hold off on treatment dose steroid. Will continue when necessary DuoNeb  Odontophagia/dysphagia/esophageal stricture, secondary to esophageal candidiasis/herpes esophagitis, and radiation-induced mucositis. The patient was evaluated with an EGD by GI during the previous hospitalization and the results were consistent with the above stated. She was discharged on Diflucan and acyclovir, along with Protonix and Carafate. The total treatment recommended for Diflucan is 21 days and for acyclovir 14-21 days. -IV Diflucan and acyclovir were restarted on admission. IV Protonix was also restarted. Carafate was initially withheld, but will be restarted. Magic mouthwash with lidocaine was also restarted.  -She was started on a full liquid diet, but there is some concern of aspiration. She actually feels as if she can can eat solid foods.  -We will advance her diet to dysphagia 2 with thin liquids. Will consult speech therapy for evaluation.  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 Patient is followed by oncology at [facility] Christene Slates, M.D.). Chemotherapeutic agent, Lenvatinib is currently on hold from recommendation from the previous hospitalization, as it can cause  mucositis. Patient is also on Decadron which was titrated down to 1 mg daily prior to discharge. -We'll continue it.   Pancytopenia. Patient has leukopenia, anemia, and thrombocytopenia, but not in the range that requires transfusion. We'll continue to monitor.  Mild hyperglycemia, likely steroid-induced. She was started on sliding scale NovoLog. Will order hemoglobin A1c.   Will transfer to telemetry. Will order PT evaluation. We'll need to check her oxygen saturations on room air prior to anticipated discharge in the next day or 2.  Time spent: 30 minutes minutes    [facility] Pager [phone]. If 7PM-7AM, please contact night-coverage at [email or web address], password [facility] 03/17/2015, 8:39 AM  LOS: 2 days

## 2015-03-17 NOTE — Procedures (Signed)
Objective Swallowing Evaluation: MBS-Modified Barium Swallow Study  Patient Details  Name: Haley Lowe MRN: [id number] Date of Birth: 1955/07/09  Today's Date: 03/17/2015 Time: SLP Start Time (ACUTE ONLY): 1254-SLP Stop Time (ACUTE ONLY): 1326 SLP Time Calculation (min) (ACUTE ONLY): 32 min  Past Medical History:  Past Medical History  Diagnosis Date  . Essential hypertension   . Anxiety   . Depression   . COPD (chronic obstructive pulmonary disease) ([place])   . GERD (gastroesophageal reflux disease)   . Headache(784.0)   . Arthritis   . Hx of adenomatous colonic polyps   . Emphysema   . Cataract     1 lens implaqnt right eye,intact on left eye  . Goiter   . Fibromyalgia   . Pulmonary emboli ([place]) 11/04/11    Right upper lobe and r lower lobe PE  . Lung nodule seen on imaging study 11/04/11 CT    51m LLL  . DVT (deep venous thrombosis) ([place] 12/09[place]2011  . [place] 12/09[place]2011  . Hyperlipidemia   . History of radiation therapy 10/06/11    SRS 15Gy 152f brain  . UTI (lower urinary tract infection)   . Borderline diabetes mellitus   . Metastatic adenocarcinoma [place]    Left frontoparietal region  . Thyroid cancer ([facility]  . S/P radiation therapy 05/31/14 SRS    Brain  . PONV (postoperative nausea and vomiting)    Past Surgical History:  Past Surgical History  Procedure Laterality Date  . Abdominal hysterectomy  1996  . Cholecystectomy  2000  . Shoulder surgery Right 1998  . Eye surgery  2001  . Foot surgery Left 2005  . Craniotomy  09/15/2011    Procedure: CRANIOTOMY TUMOR EXCISION;  Surgeon: RoHosie SpangleMD;  [facility];  Service: Neurosurgery;  Laterality: N/A;  Craniotomy resection of tumor with stealth  . Cataract extraction  2011    With lens implant  . [facility]    . Video bronchoscopy with endobronchial ultrasound N/A 04/11/2014   Procedure: VIDEO BRONCHOSCOPY WITH ENDOBRONCHIAL ULTRASOUND;  Surgeon: StMelrose NakayamaMD;  Location: [facility] Service: Thoracic;  Laterality: N/A;  . Esophagogastroduodenoscopy (egd) with propofol N/A 03/07/2015    Procedure: ESOPHAGOGASTRODUODENOSCOPY (EGD) WITH PROPOFOL;  Surgeon: RoDaneil DolinMD;  Location[facility];  Service: Endoscopy;  Laterality: N/A;  . Esophageal biopsy  03/07/2015    Procedure: BIOPSY;  Surgeon: RoDaneil DolinMD;  [facility];  Service: Endoscopy;;   HPI: Haley Lowe a [age]ear old lady who was discharged from the hospital on 03/13/15 after being treated for HCAP, herpes and candidal esophagitis, and mucositis. She seemed to be doing well when she was discharged and was eating well. However, during the 24 hours prior to admission, she had been having more generalized weakness, chest congestion, and respiratory distress associated with altered mental status. Her daughter is in a nurse and apparently she checked the patient's oxygen saturations and they were in the 70s to 80s. On arrival to the ED, her pulse oximeter was reported as 70-80% on 6 L/m of nasal cannula. ABG revealed pH of 7.4, PCO2 of 38, and PO2 of 70.5. She was subsequently placed on a nonrebreather mask. Her chest x-ray revealed mild bibasilar opacities, mildly worsened and CT scan of the chest without contrast revealed bilateral lower lobes infiltrates and a few small nodules  noted. She was admitted for further evaluation and management. MD requested SLP for swallow evaluation. Pt had a barium swallow on 01/01/2015 which showed silent aspiration of contrast. During previous admission last week, she had EGD with dilation on 03/07/2015.  Subjective: Pt seen upright in [place] chair for MBSS  Assessment / Plan / Recommendation  CHL IP CLINICAL IMPRESSIONS 03/17/2015  Therapy Diagnosis Mild oral phase dysphagia;Mild pharyngeal phase dysphagia;Moderate pharyngeal phase dysphagia  Clinical  Impression Mild oral phase (likely due to lesions along tongue and palate) and mild/mod pharyngeal phase dysphagia characterized by premature spillage with delay in swallow initiation with thin liquids, decreased tongue base retraction, epiglottic deflection and hyolaryngeal excursion resulting in variable trace penetration of thin liquids. Pt also with mild/mod vallecular residue post swallow; penetration occurred after the swallow occasionally with thin liquids when vallecular residue trickled into laryngeal vestibule (down underside of epiglottis). Recommend D3/mech soft with thin liquids with use of strategies: small sips, clear throat, repeat swallow with liquids. SLP will follow while in acute setting for education regarding effects of radiation therapy on swallow, aspiration risks, and compensatory strategies. Pt would likely benefit from [facility] or outpatient SLP for this, however she declines stating that it is cost prohibitive for her. SLP will attempt to provide pt with information while in acute setting.   Impact on safety and function Mild aspiration risk      CHL IP TREATMENT RECOMMENDATION 03/17/2015  Treatment Recommendations Therapy as outlined in treatment plan below     Prognosis 03/17/2015  Prognosis for Safe Diet Advancement Fair  Barriers to Reach Goals --  Barriers/Prognosis Comment --    CHL IP DIET RECOMMENDATION 03/17/2015  SLP Diet Recommendations Dysphagia 3 (Mech soft) solids;Thin liquid  Liquid Administration via Cup;Straw  Medication Administration Whole meds with liquid  Compensations Slow rate;Small sips/bites;Multiple dry swallows after each bite/sip;Clear throat intermittently;Effortful swallow  Postural Changes Remain semi-upright after after feeds/meals (Comment);Seated upright at 90 degrees      CHL IP OTHER RECOMMENDATIONS 03/17/2015  Recommended Consults --  Oral Care Recommendations Oral care BID;Patient independent with oral care  Other Recommendations  Clarify dietary restrictions      CHL IP FOLLOW UP RECOMMENDATIONS 03/17/2015  Follow up Recommendations Other (comment)      CHL IP FREQUENCY AND DURATION 03/17/2015  Speech Therapy Frequency (ACUTE ONLY) min 1 x/week  Treatment Duration 1 week           CHL IP ORAL PHASE 03/17/2015  Oral Phase WFL  Oral - Pudding Teaspoon --  Oral - Pudding Cup --  Oral - Honey Teaspoon --  Oral - Honey Cup --  Oral - Nectar Teaspoon --  Oral - Nectar Cup --  Oral - Nectar Straw --  Oral - Thin Teaspoon --  Oral - Thin Cup --  Oral - Thin Straw --  Oral - Puree --  Oral - Mech Soft --  Oral - Regular --  Oral - Multi-Consistency --  Oral - Pill --  Oral Phase - Comment --    CHL IP PHARYNGEAL PHASE 03/17/2015  Pharyngeal Phase Impaired  Pharyngeal- Pudding Teaspoon --  Pharyngeal --  Pharyngeal- Pudding Cup --  Pharyngeal --  Pharyngeal- Honey Teaspoon --  Pharyngeal --  Pharyngeal- Honey Cup --  Pharyngeal --  Pharyngeal- Nectar Teaspoon --  Pharyngeal --  Pharyngeal- Nectar Cup Delayed swallow initiation-vallecula;Reduced epiglottic inversion;Reduced laryngeal elevation;Reduced airway/laryngeal closure;Reduced tongue base retraction;Penetration/Aspiration during swallow;Penetration/Apiration after swallow;Pharyngeal residue - valleculae  Pharyngeal Material does not enter  airway;Material enters airway, remains ABOVE vocal cords then ejected out  Pharyngeal- Nectar Straw --  Pharyngeal --  Pharyngeal- Thin Teaspoon --  Pharyngeal --  Pharyngeal- Thin Cup Delayed swallow initiation-vallecula;Reduced epiglottic inversion;Reduced laryngeal elevation;Reduced airway/laryngeal closure;Reduced tongue base retraction;Penetration/Aspiration during swallow;Penetration/Apiration after swallow;Trace aspiration;Pharyngeal residue - valleculae  Pharyngeal Material does not enter airway;Material enters airway, CONTACTS cords and then ejected out;Material enters airway, remains ABOVE vocal  cords and not ejected out  Pharyngeal- Thin Straw Delayed swallow initiation-vallecula;Delayed swallow initiation-pyriform sinuses;Reduced epiglottic inversion;Reduced laryngeal elevation;Reduced airway/laryngeal closure;Penetration/Aspiration during swallow;Pharyngeal residue - valleculae;Lateral channel residue  Pharyngeal Material does not enter airway;Material enters airway, remains ABOVE vocal cords then ejected out;Material enters airway, remains ABOVE vocal cords and not ejected out  Pharyngeal- Puree Delayed swallow initiation-vallecula;Reduced epiglottic inversion;Reduced tongue base retraction;Pharyngeal residue - valleculae  Pharyngeal --  Pharyngeal- Mechanical Soft Delayed swallow initiation-vallecula;Reduced tongue base retraction;Pharyngeal residue - valleculae  Pharyngeal --  Pharyngeal- Regular --  Pharyngeal --  Pharyngeal- Multi-consistency --  Pharyngeal --  Pharyngeal- Pill WFL  Pharyngeal --  Pharyngeal Comment --     CHL IP CERVICAL ESOPHAGEAL PHASE 03/17/2015  Cervical Esophageal Phase Impaired  Pudding Teaspoon --  Pudding Cup --  Honey Teaspoon --  Honey Cup --  Nectar Teaspoon --  Nectar Cup --  Nectar Straw --  Thin Teaspoon --  Thin Cup --  Thin Straw Prominent cricopharyngeal segment  Puree --  Mechanical Soft --  Regular --  Multi-consistency --  Pill --  Cervical Esophageal Comment --   Thank you,  Genene Churn, [place]   PORTER,DABNEY 03/17/2015, 9:58 PM

## 2015-03-17 NOTE — Evaluation (Signed)
Physical Therapy Evaluation Patient Details Name: Haley Lowe MRN: [id number] DOB: May 14, 1955 Today's Date: 03/17/2015   History of Present Illness  This is a 59 year old lady was discharged from the hospital just a couple of days ago after she had been admitted with and treated for hospital-acquired pneumonia. She seemed to be doing well when she was discharged and was eating well. However in the last 24 hours she has gone downhill with increasing chest congestion and respiratory distress associated with altered mental status. The patient is self cannot give me any history because she is very drowsy and not very responsive and in a nonrebreather mask. Her saturations at home on room air were 60-70%. In the emergency room, her pulse oximeter was 70-80% on 6 L/m of nasal cannula. She was subsequently placed on a nonrebreather mask. The family tell me that she has been somewhat choking on some oral liquids at home. She is now being admitted for further management.  Clinical Impression   Pt was seen for evaluation.  She was alert and oriented, very cooperative.  She was found on 3.5 L O2 with O2 sat=98%.  During muscle testing, therapeutic exercise we titrated down her supplemental O2 to 1 L/min with O2 sat=95%.  However, when she sat up, O2 sat decreased to 88-90%.  O2 sat was slowly increased to 2, then 3L/min with O2 remaining at 95% during gait with walker.  Her gait with a walker is very stable for 400'.  We will continue to follow to insure her ability to return home at d/c.    Follow Up Recommendations No PT follow up    Equipment Recommendations  None recommended by PT    Recommendations for Other Services   none    Precautions / Restrictions Precautions Precautions: None Restrictions Weight Bearing Restrictions: No      Mobility  Bed Mobility Overal bed mobility: Modified Independent                Transfers Overall transfer level: Modified independent Equipment used:  Rolling walker (2 wheeled) Transfers: Sit to/from Stand Sit to Stand: Modified independent (Device/Increase time);From elevated surface         General transfer comment: not tested from a low sitting height  Ambulation/Gait Ambulation/Gait assistance: Supervision Ambulation Distance (Feet): 400 Feet Assistive device: Rolling walker (2 wheeled) Gait Pattern/deviations: WFL(Within Functional Limits);Trunk flexed   Gait velocity interpretation: >2.62 ft/sec, indicative of independent community ambulator General Gait Details: walker helpful for decreasing overall exertional level-  O2 set at 2L/min with O2 sat= 95%  Stairs            Wheelchair Mobility    Modified Rankin (Stroke Patients Only)       Balance Overall balance assessment: No apparent balance deficits (not formally assessed)                                           Pertinent Vitals/Pain Pain Assessment: No/denies pain    Home Living Family/patient expects to be discharged to:: Private residence Living Arrangements: Spouse/significant other Available Help at Discharge: Family Type of Home: House Home Access: Stairs to enter Entrance Stairs-Rails: None Entrance Stairs-Number of Steps: 4 Home Layout: One level Home Equipment: None      Prior Function Level of Independence: Independent         Comments: able to be independent when feeling well, husband available  to assist PRN     Hand Dominance   Dominant Hand: Right    Extremity/Trunk Assessment               Lower Extremity Assessment: Overall WFL for tasks assessed      Cervical / Trunk Assessment: Normal  Communication   Communication: No difficulties  Cognition Arousal/Alertness: Awake/alert Behavior During Therapy: WFL for tasks assessed/performed Overall Cognitive Status: Within Functional Limits for tasks assessed                      General Comments      Exercises General Exercises -  Lower Extremity Ankle Circles/Pumps: AROM;Both;10 reps;Supine Quad Sets: AROM;Both;10 reps;Supine Gluteal Sets: AROM;Both;10 reps;Supine Short Arc Quad: AROM;Both;10 reps;Supine Heel Slides: AROM;Both;10 reps;Supine Straight Leg Raises: AROM;Both;10 reps;Supine      Assessment/Plan    PT Assessment Patient needs continued PT services  PT Diagnosis Generalized weakness (mlldly deconditioned)   PT Problem List Decreased strength;Decreased activity tolerance;Decreased mobility  PT Treatment Interventions Gait training;Functional mobility training;Therapeutic exercise;Stair training   PT Goals (Current goals can be found in the Care Plan section) Acute Rehab PT Goals Patient Stated Goal: Return to home, resume CA treatments as planned  PT Goal Formulation: With patient Time For Goal Achievement: 03/31/15 Potential to Achieve Goals: Good    Frequency Min 2X/week   Barriers to discharge        Co-evaluation               End of Session Equipment Utilized During Treatment: Gait belt;Oxygen Activity Tolerance: Patient tolerated treatment well Patient left: in chair;with call bell/phone within reach Nurse Communication: Mobility status         Time: 0920-1012 PT Time Calculation (min) (ACUTE ONLY): 52 min   Charges:   PT Evaluation $Initial PT Evaluation Tier I: 1 Procedure PT Treatments $Therapeutic Exercise: 8-22 mins   PT G CodesSable Lowe  PT 03/17/2015, 10:32 AM [phone]

## 2015-03-18 ENCOUNTER — Encounter (HOSPITAL_COMMUNITY): Payer: Self-pay | Admitting: Internal Medicine

## 2015-03-18 DIAGNOSIS — E119 Type 2 diabetes mellitus without complications: Secondary | ICD-10-CM

## 2015-03-18 DIAGNOSIS — D696 Thrombocytopenia, unspecified: Secondary | ICD-10-CM

## 2015-03-18 DIAGNOSIS — K1230 Oral mucositis (ulcerative), unspecified: Secondary | ICD-10-CM

## 2015-03-18 DIAGNOSIS — R1310 Dysphagia, unspecified: Secondary | ICD-10-CM

## 2015-03-18 DIAGNOSIS — E860 Dehydration: Secondary | ICD-10-CM

## 2015-03-18 HISTORY — DX: Type 2 diabetes mellitus without complications: E11.9

## 2015-03-18 LAB — CBC
HEMATOCRIT: 36 % (ref 36.0–46.0)
HEMOGLOBIN: 11.6 g/dL — AB (ref 12.0–15.0)
MCH: 28.6 pg (ref 26.0–34.0)
MCHC: 32.2 g/dL (ref 30.0–36.0)
MCV: 88.9 fL (ref 78.0–100.0)
Platelets: 142 10*3/uL — ABNORMAL LOW (ref 150–400)
RBC: 4.05 MIL/uL (ref 3.87–5.11)
RDW: 17.2 % — ABNORMAL HIGH (ref 11.5–15.5)
WBC: 4.6 10*3/uL (ref 4.0–10.5)

## 2015-03-18 LAB — BASIC METABOLIC PANEL
Anion gap: 6 (ref 5–15)
BUN: 6 mg/dL (ref 6–20)
CHLORIDE: 105 mmol/L (ref 101–111)
CO2: 28 mmol/L (ref 22–32)
CREATININE: 0.41 mg/dL — AB (ref 0.44–1.00)
Calcium: 8.2 mg/dL — ABNORMAL LOW (ref 8.9–10.3)
GFR calc Af Amer: >60 mL/min (ref >60)
GFR calc non Af Amer: >60 mL/min (ref >60)
GLUCOSE: 124 mg/dL — AB (ref 65–99)
Potassium: 3.6 mmol/L (ref 3.5–5.1)
Sodium: 139 mmol/L (ref 135–145)

## 2015-03-18 LAB — HEMOGLOBIN A1C
Hgb A1c MFr Bld: 6.9 % — ABNORMAL HIGH (ref 4.8–5.6)
MEAN PLASMA GLUCOSE: 151 mg/dL

## 2015-03-18 LAB — GLUCOSE, CAPILLARY
GLUCOSE-CAPILLARY: 93 mg/dL (ref 65–99)
Glucose-Capillary: 112 mg/dL — ABNORMAL HIGH (ref 65–99)

## 2015-03-18 MED ORDER — FLUCONAZOLE 100 MG PO TABS
100.0000 mg | ORAL_TABLET | Freq: Every day | ORAL | Status: DC
  Administered 2015-03-18: 100 mg via ORAL
  Filled 2015-03-18: qty 1

## 2015-03-18 MED ORDER — ACYCLOVIR 200 MG PO CAPS
400.0000 mg | ORAL_CAPSULE | Freq: Every day | ORAL | Status: DC
  Administered 2015-03-18 (×2): 400 mg via ORAL
  Filled 2015-03-18 (×2): qty 2

## 2015-03-18 MED ORDER — FLUCONAZOLE 100 MG PO TABS: 100.0000 mg | ORAL_TABLET | Freq: Every day | ORAL | Status: DC

## 2015-03-18 MED ORDER — GUAIFENESIN-DM 100-10 MG/5ML PO SYRP: 5.0000 mL | ORAL_SOLUTION | ORAL | Status: DC | PRN

## 2015-03-18 MED ORDER — GLIMEPIRIDE 1 MG PO TABS: ORAL_TABLET | ORAL | Status: DC

## 2015-03-18 MED ORDER — CEFUROXIME AXETIL 500 MG PO TABS: 500.0000 mg | ORAL_TABLET | Freq: Two times a day (BID) | ORAL | Status: DC

## 2015-03-18 MED ORDER — MORPHINE SULFATE ER 60 MG PO TBCR: 60.0000 mg | EXTENDED_RELEASE_TABLET | Freq: Two times a day (BID) | ORAL | Status: DC

## 2015-03-18 MED ORDER — MORPHINE SULFATE (PF) 4 MG/ML IV SOLN
4.0000 mg | Freq: Once | INTRAVENOUS | Status: AC
  Administered 2015-03-18: 4 mg via INTRAVENOUS
  Filled 2015-03-18: qty 1

## 2015-03-18 MED ORDER — DEXAMETHASONE 0.5 MG PO TABS
1.0000 mg | ORAL_TABLET | Freq: Every day | ORAL | Status: DC
  Filled 2015-03-18: qty 2

## 2015-03-18 MED ORDER — ACYCLOVIR 200 MG PO CAPS: 400.0000 mg | ORAL_CAPSULE | Freq: Three times a day (TID) | ORAL | Status: DC

## 2015-03-18 MED ORDER — FUROSEMIDE 10 MG/ML IJ SOLN
10.0000 mg | Freq: Once | INTRAMUSCULAR | Status: AC
  Administered 2015-03-18: 10 mg via INTRAVENOUS
  Filled 2015-03-18: qty 2

## 2015-03-18 NOTE — Progress Notes (Signed)
Instructions given on medications,and follow up visits,patient verbalized understanding. Prescriptions sent with patient.Vital signs stable. Staff to accompany patient to an awaiting vehicle.

## 2015-03-18 NOTE — Care Management Note (Signed)
Case Management Note  Patient Details  Name: Haley Lowe MRN: [id number] Date of Birth: May 11, 1955  Subjective/Objective:                  Pt is from home, lives with her husband and is ind with ADL's.Pt has no HH services or DME's prior to admission. Pt does not meet requirements for home O2.   Action/Plan: Pt discharging home today with self care. No CM needs.   Expected Discharge Date:  03/19/15               Expected Discharge Plan:  Home/Self Care  In-House Referral:  NA  Discharge planning Services  CM Consult  Post Acute Care Choice:  NA Choice offered to:  NA  DME Arranged:    DME Agency:     HH Arranged:    HH Agency:     Status of Service:  Completed, signed off  Medicare Important Message Given:  Yes Date Medicare IM Given:    Medicare IM give by:    Date Additional Medicare IM Given:    Additional Medicare Important Message give by:     If discussed at [place] of Stay Meetings, dates discussed:    Additional Comments:  Sherald Barge, RN 03/18/2015, 1:20 PM

## 2015-03-18 NOTE — Progress Notes (Signed)
Speech Language Pathology Treatment: Dysphagia  Patient Details Name: Haley Lowe MRN: [id number] DOB: Aug 23, 1955 Today's Date: 03/18/2015 Time: 3128-1188 SLP Time Calculation (min) (ACUTE ONLY): 21 min  Assessment / Plan / Recommendation Clinical Impression  Pt seen in room with lunch meal and husband present for review of MBSS, diet tolerance, and education. Pt and husband report that most coughing occurs when drinking water. Pt took a sip of water via straw and immediately coughed. SLP reinforced recommendation to avoid use of straws, pt in agreement. SLP educated pt and husband on long term effects of radiation to tissue and its likely effect on pharyngeal swallow. Pt given SLP phone number should she have further questions after discharge (or for her daughters to call). Continue with mechanical soft diet and thin liquids; no straws, clear throat and repeat swallow after sips of liquids.    HPI HPI: Haley Lowe is a 59 year old lady who was discharged from the hospital on 03/13/15 after being treated for HCAP, herpes and candidal esophagitis, and mucositis. She seemed to be doing well when she was discharged and was eating well. However, during the 24 hours prior to admission, she had been having more generalized weakness, chest congestion, and respiratory distress associated with altered mental status. Her daughter is in a nurse and apparently she checked the patient's oxygen saturations and they were in the 70s to 80s. On arrival to the ED, her pulse oximeter was reported as 70-80% on 6 L/m of nasal cannula. ABG revealed pH of 7.4, PCO2 of 38, and PO2 of 70.5. She was subsequently placed on a nonrebreather mask. Her chest x-ray revealed mild bibasilar opacities, mildly worsened and CT scan of the chest without contrast revealed bilateral lower lobes infiltrates and a few small nodules noted. She was admitted for further evaluation and management. MD requested SLP for swallow evaluation. Pt had a  barium swallow on 01/01/2015 which showed silent aspiration of contrast. During previous admission last week, she had EGD with dilation on 03/07/2015.      SLP Plan  All goals met;Discharge SLP treatment due to (comment)     Recommendations  Diet recommendations: Dysphagia 3 (mechanical soft) Liquids provided via: Cup;No straw Medication Administration: Whole meds with liquid Supervision: Patient able to self feed;Intermittent supervision to cue for compensatory strategies Compensations: Slow rate;Small sips/bites;Multiple dry swallows after each bite/sip;Clear throat intermittently;Effortful swallow Postural Changes and/or Swallow Maneuvers: Seated upright 90 degrees;Out of bed for meals;Upright 30-60 min after meal              Oral Care Recommendations: Oral care BID Plan: All goals met;Discharge SLP treatment due to (comment)  Thank you,  Genene Churn, [place]  [place] 03/18/2015, 2:08 PM

## 2015-03-18 NOTE — Discharge Summary (Signed)
Physician Discharge Summary  Haley Lowe QIH:[id number] DOB: 1955-09-19 DOA: 03/15/2015  PCP: [name], MD  Admit date: 03/15/2015 Discharge date: 03/18/2015  Time spent: Greater than 30 minutes  Recommendations for Outpatient Follow-up:  1. Recommend follow-up assessment of dosing of OxyContin and oxycodone. 2. Patient was instructed to follow-up with her oncologist as scheduled. Would recommend follow-up of her blood counts.   Discharge Diagnoses:  1. Acute respiratory failure with hypoxia, presumed secondary to persistent HCAP, possibly complicated by opiate use. 2. Healthcare associated pneumonia, recently treated during the previous hospitalization. 3. COPD with asthma. Remained stable. 4. 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 5. 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 On chemotherapy. Status post radiation to the brain. 6. Pancytopenia, likely from chemotherapy. 7. Previous prediabetes, now with type 2 diabetes mellitus. Hemoglobin A1c was 6.9. 8. Severe protein calorie malnutrition. 9. Hypothyroidism. 10. Chronic malignant pain, treated with chronic opiates.   Discharge Condition: Improved.  Diet recommendation: Soft as tolerated.  Filed Weights   03/15/15 2007 03/16/15 0100 03/16/15 0500  Weight: 66.225 kg (146 lb) 66.7 kg (147 lb 0.8 oz) 66.7 kg (147 lb 0.8 oz)    History of present illness:  This is a 59 year old lady who was discharged from the hospital on 03/13/15 after being treated for HCAP, herpes and candidal esophagitis, and mucositis. She seemed to be doing well when she was discharged and was eating well. However, during the 24 hours prior to admission, she had been having more generalized weakness, chest congestion, and respiratory distress associated with altered mental status. Her daughter who is a Marine scientist and apparently she checked the  patient's oxygen saturations and they were in the 70s to 80s. On arrival to the ED, her pulse oximeter was reported as 70-80% on 6 L/m of nasal cannula. ABG revealed pH of 7.4, PCO2 of 38, and PO2 of 70.5. She was subsequently placed on a nonrebreather mask. Her chest x-ray revealed mild bibasilar opacities, mildly worsened and CT scan of the chest without contrast revealed bilateral lower lobes infiltrates and a few small nodules noted. She was admitted for further evaluation and management.  Hospital Course:  1. Acute respiratory failure with hypoxia, presumably secondary to persistent HCAP. The patient was just discharged on 03/13/2015 for treatment of HCAP. During the previous hospital course, she was treated with broad spectrum IV antibiotics and transitioned to Levaquin for which she completed the course prior to hospital discharge. She returned with reported hypoxia and generalized weakness with altered mental status changes at home. -On admission, on a nonrebreather, her PO2 was 70.5. Follow-up ABG revealed a PO2 of 81.5 on 8 L. -On admission, her white blood cell count was not elevated and she was not febrile. -On admission, CT scan of her chest revealed bilateral lower lobe infiltrates. She did have bilateral lower lobe infiltrates on the x-ray during the previous hospitalization. - She was started on vancomycin and Fortaz. TSH and B12 were ordered for evaluation. Her TSH was within normal limits. Her vitamin B12 level was actually elevated. -Pulmonologist, Dr. Luan Pulling was consulted. He agreed with the management. He wondered if the patient had an element of aspiration. -Speech therapist was consulted. Per her evaluation, the patient had mild oropharyngeal dysphagia. Dysphagia 3 diet with thin liquids were ordered, which the patient tolerated well. -The patient's follow-up oxygen saturations were in the mid 90s on room air at rest and with ambulation prior to discharge. -Antibiotic therapy was  narrowed to Ceftin  and it was prescribed for 4 more days following discharge. -Of note, the patient takes 60 mg of OxyContin every 12 hours and 20 mg of oxycodone every 4 hours for breakthrough pain. I believe that the patient may have had hypoxia from these opiates in the setting of resolving pneumonia. This was discussed with the patient and I instructed her to use these opiates with caution as they can cause some respiratory compromise in appropriate settings. She understood and would discuss the dosing further with her oncologist.  COPD with asthma. She had no wheezing on pulmonary exam. As needed DuoNeb's were ordered.  Odontophagia/dysphagia/esophageal stricture, secondary to esophageal candidiasis/herpes esophagitis, and radiation-induced mucositis. The patient was evaluated with an EGD by GI during the previous hospitalization and the results were consistent with the above stated. She was discharged on Diflucan and acyclovir, along with Protonix and Carafate. The total treatment recommended for Diflucan is 21 days and for acyclovir 14-21 days. -IV Diflucan and acyclovir were restarted on admission. IV Protonix was also restarted. Carafate was initially withheld, but was  restarted. Magic mouthwash with lidocaine was also restarted.  -She was started on a full liquid diet. Her diet was advanced following the speech therapy evaluation. -At the time of discharge, she was instructed to restart the treatment medications as stated above until they were completed. She voiced understanding.   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 Patient is followed by oncology at [facility] Christene Slates, M.D.). Chemotherapeutic agent, Lenvatinib was held as this was the recommendation from the previous hospitalization as it can cause mucositis. Patient was also restarted on Decadron. However, at the time of discharge, both the patient and her husband stated that she had completed  the course as prescribed by her radiation oncologist. Therefore, Decadron was discontinued at the time of discharge.  Pancytopenia. Patient has leukopenia, anemia, and thrombocytopenia, all likely from chemotherapy. Her counts were not in the range that required transfusion. At the time of discharge, her double UBC was 4.6, hemoglobin 11.6, and platelet count 142.  Prediabetes>>> Type 2 diabetes. Patient was told that she had prediabetes which has been exacerbated by recent steroid treatment. She is familiar with testing her blood sugars at home and watching her diet. Her blood glucose was elevated on admission. She was started on sliding scale NovoLog. Her A1c was found to be 6.9. At the time of discharge, she was prescribed Amaryl 0.5 mg daily as needed for a CBG of 150 or greater. She and her husband voiced understanding.  Procedures:  None  Consultations:  Pulmonologist Dr. Luan Pulling  Discharge Exam: Filed Vitals:   03/17/15 2044 03/18/15 0606  BP: 141/87 135/97  Pulse: 81 90  Temp: 98.3 F (36.8 C) 97.8 F (36.6 C)  Resp: 20 20   oxygen saturation 93% on room air.  General: 59 year old woman in no acute distress. She looks better and is smiling.  Oropharynx reveals single healing ulcer on the tip of her tongue; resolving mild erythema of her posterior pharynx; no white exudates noted.  Cardiovascular: S1, S2, no murmurs rubs or gallops.  Respiratory: Few crackles in the bases with coarse breath sounds in the upper lobes; breathing nonlabored at rest.  Abdomen: Positive bowel sounds, soft, nontender, nondistended.  Musculoskeletal/extremities: No pedal edema. No acute hot red joints.  Neurologic: She is alert and oriented 2. Cranial nerves II through XII are grossly intact.   Discharge Instructions   Discharge Instructions    Diet Carb Modified  Complete by:  As directed      Discharge instructions    Complete by:  As directed   Use MS Contin with caution as it  can cause confusion and affect her breathing. Please discuss the current dosing and possible decrease in the dose with your oncologist. Also check your blood sugars at least once daily.     Increase activity slowly    Complete by:  As directed           Current Discharge Medication List    START taking these medications   Details  cefUROXime (CEFTIN) 500 MG tablet Take 1 tablet (500 mg total) by mouth 2 (two) times daily with a meal. Antibiotic be taken as directed, starting tomorrow on 03/19/15. Qty: 8 tablet, Refills: 0    glimepiride (AMARYL) 1 MG tablet Take a half a tablet daily as needed for a blood sugar greater 150 or greater. Qty: 15 tablet, Refills: 2    guaiFENesin-dextromethorphan (ROBITUSSIN DM) 100-10 MG/5ML syrup Take 5 mLs by mouth every 4 (four) hours as needed for cough.      CONTINUE these medications which have CHANGED   Details  acyclovir (ZOVIRAX) 200 MG capsule Take 2 capsules (400 mg total) by mouth 3 (three) times daily. Continue through 03/23/15 or until completed.    fluconazole (DIFLUCAN) 100 MG tablet Take 1 tablet (100 mg total) by mouth daily. Continue through 03/23/15 or until completion. Qty: 11 tablet, Refills: 0    morphine (MS CONTIN) 60 MG 12 hr tablet Take 1 tablet (60 mg total) by mouth every 12 (twelve) hours. Use this medication with caution as it can cause confusion and can affect her breathing.      CONTINUE these medications which have NOT CHANGED   Details  ALPRAZolam (XANAX) 0.5 MG tablet Take 0.5 mg by mouth 3 (three) times daily as needed for anxiety.     amitriptyline (ELAVIL) 25 MG tablet Take 25 mg by mouth at bedtime.    amLODipine (NORVASC) 10 MG tablet Take 10 mg by mouth daily.    levothyroxine (SYNTHROID, LEVOTHROID) 137 MCG tablet Take 137 mcg by mouth daily before breakfast.    magic mouthwash w/lidocaine SOLN Take 5 mLs by mouth 4 (four) times daily as needed for mouth pain. Qty: 400 mL, Refills: 0    Magnesium 250  MG TABS Take 1 tablet by mouth daily.    ondansetron (ZOFRAN ODT) 8 MG disintegrating tablet Take 1 tablet (8 mg total) by mouth every 8 (eight) hours as needed for nausea or vomiting. Qty: 20 tablet, Refills: 0    Oxycodone HCl 20 MG TABS Take 1 tablet by mouth every 4 (four) hours as needed (pain).    pantoprazole (PROTONIX) 40 MG tablet Take 1 tablet (40 mg total) by mouth 2 (two) times daily. Qty: 60 tablet, Refills: 0    polyethylene glycol (MIRALAX / GLYCOLAX) packet Take 17 g by mouth every other day. For constipation    Simethicone (GAS-X EXTRA STRENGTH PO) Take 1 tablet by mouth daily as needed (gas relief).    sucralfate (CARAFATE) 1 G tablet Take 1 tablet (1 g total) by mouth 4 (four) times daily -  with meals and at bedtime. Qty: 60 tablet, Refills: 0    Wound Dressings (SONAFINE) Apply 1 application topically daily.      STOP taking these medications     dexamethasone (DECADRON) 1 MG tablet        Allergies  Allergen Reactions  . Penicillins  Swelling    " my brain swelled, and mymouth" Has patient had a PCN reaction causing immediate rash, facial/tongue/throat swelling, SOB or lightheadedness with hypotension: No Has patient had a PCN reaction causing severe rash involving mucus membranes or skin necrosis: No Has patient had a PCN reaction that required hospitalization No Has patient had a PCN reaction occurring within the last 10 years: No If all of the above answers are "NO", then may proceed with Cephalosporin use.   . Other Swelling    PT ALSO REACTS TO PAPER TAPE AND BAND-AIDS.  Marland Kitchen Codeine Itching    Mild takes benadryl  . Tape Rash   Follow-up Information    Please follow up.   Why:  Follow-up with your oncologist as scheduled.       The results of significant diagnostics from this hospitalization (including imaging, microbiology, ancillary and laboratory) are listed below for reference.    Significant Diagnostic Studies: Dg Chest 2  View  03/15/2015  CLINICAL DATA:  Recently diagnosed with pneumonia. Severe shortness of breath and generalized weakness. Initial encounter. EXAM: CHEST  2 VIEW COMPARISON:  Chest radiograph performed 03/03/2015 FINDINGS: The lungs are well-aerated. Mild bibasilar opacities raise concern for mildly worsening multifocal pneumonia. There is no evidence of pleural effusion or pneumothorax. The heart is normal in size; the mediastinal contour is within normal limits. No acute osseous abnormalities are seen. Clips are noted within the right upper quadrant, reflecting prior cholecystectomy. IMPRESSION: Mild bibasilar opacities are mildly worsened, raising concern for mildly worsening multifocal pneumonia. Electronically Signed   By: Garald Balding M.D.   On: 03/15/2015 21:38   Dg Chest 2 View  03/03/2015  CLINICAL DATA:  Acute onset of fever, cough, and mouth and throat sores. Status post recent radiation therapy for thyroid cancer. Recently diagnosed with pneumonia. Initial encounter. EXAM: CHEST  2 VIEW COMPARISON:  Chest radiograph performed 03/02/2015 FINDINGS: Persistent bibasilar airspace opacities, worse on the left, raise concern for pneumonia, similar in appearance to the prior study. No pleural effusion or pneumothorax is seen. The heart remains normal in size. No acute osseous abnormalities are identified. IMPRESSION: Persistent bibasilar airspace opacities, worse on the left, raise concern for mild pneumonia, similar appearance to the prior study. Electronically Signed   By: Garald Balding M.D.   On: 03/03/2015 00:56   Dg Chest 2 View  03/02/2015  CLINICAL DATA:  Cough, congestion and sore mild as patient just completed radiation treatment for thyroid cancer. EXAM: CHEST  2 VIEW COMPARISON:  01/01/2015 FINDINGS: Lungs are adequately inflated and demonstrate worsening bibasilar heterogeneous density with bronchial thickening. No evidence of effusion. Stable borderline cardiomegaly. There is calcified  plaque over the aortic arch. There are degenerative changes of the spine. IMPRESSION: Worsening heterogeneous bibasilar opacification with bronchial thickening as findings may be due to bronchitis, although cannot exclude pneumonia. Electronically Signed   By: Marin Olp M.D.   On: 03/02/2015 08:48   Ct Head Wo Contrast  03/15/2015  CLINICAL DATA:  Frequent falls, confusion. EXAM: CT HEAD WITHOUT CONTRAST TECHNIQUE: Contiguous axial images were obtained from the base of the skull through the vertex without intravenous contrast. COMPARISON:  CT scan of May 04, 2014. FINDINGS: Status post left frontal craniotomy. Otherwise bony calvarium appears intact. Stable small focus of postoperative encephalomalacia in superior portion of left parietal cortex. No mass effect or midline shift is noted. Ventricular size is within normal limits. There is no evidence of mass lesion, hemorrhage or acute infarction. IMPRESSION: Status post  left frontal craniotomy. No acute intracranial abnormality seen. Electronically Signed   By: Marijo Conception, M.D.   On: 03/15/2015 21:34   Ct Chest Wo Contrast  03/15/2015  CLINICAL DATA:  Shortness of breath and weakness EXAM: CT CHEST WITHOUT CONTRAST TECHNIQUE: Multidetector CT imaging of the chest was performed following the standard protocol without IV contrast. COMPARISON:  Chest x-ray from earlier in the same day FINDINGS: Lungs are well aerated bilaterally. Bilateral lower lobe infiltrate is noted left greater than right. Few small less than 5 mm nodules are noted as well. Thoracic inlet is within normal limits. Aortic calcifications are seen without aneurysmal dilatation. No hilar or mediastinal adenopathy is seen. The visualized upper abdomen is within normal limits. The gallbladder has been surgically removed. The bony structures are within normal limits. IMPRESSION: Bilateral lower lobe infiltrates. A few small nodules are noted which may be postinflammatory as well. No  other focal abnormality is seen. Electronically Signed   By: Inez Catalina M.D.   On: 03/15/2015 23:18   Dg Swallowing Func-speech Pathology  03/18/2015  Ephraim Hamburger, CCC-SLP     03/18/2015  9:50 AM Objective Swallowing Evaluation: MBS-Modified Barium Swallow Study Patient Details Name: Haley Lowe MRN: [id number] Date of Birth: July 16, 1955 Today's Date: 03/17/2015 Time: SLP Start Time (ACUTE ONLY): 1254-SLP Stop Time (ACUTE ONLY): 1326 SLP Time Calculation (min) (ACUTE ONLY): 32 min Past Medical History: Past Medical History Diagnosis Date . Essential hypertension  . Anxiety  . Depression  . COPD (chronic obstructive pulmonary disease) ([place])  . GERD (gastroesophageal reflux disease)  . Headache(784.0)  . Arthritis  . Hx of adenomatous colonic polyps  . Emphysema  . Cataract    1 lens implaqnt right eye,intact on left eye . Goiter  . Fibromyalgia  . Pulmonary emboli ([place]) 11/04/11   Right upper lobe and r lower lobe PE . Lung nodule seen on imaging study 11/04/11 CT   76m LLL . DVT (deep venous thrombosis) ([place] 12/09/2011 . [place]monary embolism ([place] 12/09[place]2011 . Hyperlipidemia  . History of radiation therapy 10/06/11   SRS 15Gy 144f brain . UTI (lower urinary tract infection)  . Borderline diabetes mellitus  . Metastatic adenocarcinoma to brain ([facility]/12[facility]13   Left frontoparietal region . Thyroid cancer ([facility] . S/P radiation therapy 05/31/14 SRS   Brain . PONV (postoperative nausea and vomiting)  Past Surgical History: Past Surgical History Procedure Laterality Date . Abdominal hysterectomy  1996 . Cholecystectomy  2000 . Shoulder surgery Right 1998 . Eye surgery  2001 . Foot surgery Left 2005 . Craniotomy  09/15/2011   Procedure: CRANIOTOMY TUMOR EXCISION;  Surgeon: RoHosie SpangleMD;  [facility];  Service: Neurosurgery;  Laterality: N/A;  Craniotomy resection of tumor with stealth . Cataract extraction  2011   With lens implant . [facility]  Lymph gland excision   . Video bronchoscopy with endobronchial ultrasound N/A 04/11/2014   Procedure: VIDEO BRONCHOSCOPY WITH ENDOBRONCHIAL ULTRASOUND;  Surgeon: StMelrose NakayamaMD;  [facility] Service: Thoracic;  Laterality: N/A; . Esophagogastroduodenoscopy (egd) with propofol N/A 03/07/2015   Procedure: ESOPHAGOGASTRODUODENOSCOPY (EGD) WITH PROPOFOL;  Surgeon: RoDaneil DolinMD;  [facility];  Service: Endoscopy;  Laterality: N/A; . Esophageal biopsy  03/07/2015   Procedure: BIOPSY;  Surgeon: RoDaneil DolinMD;  [facility];  Service: Endoscopy;; HPI: JuSherronda Sweigerts a [age]ear old lady who was discharged from the  hospital on 03/13/15 after being treated for HCAP, herpes and candidal esophagitis, and mucositis. She seemed to be doing well when she was discharged and was eating well. However, during the 24 hours prior to admission, she had been having more generalized weakness, chest congestion, and respiratory distress associated with altered mental status. Her daughter is in a nurse and apparently she checked the patient's oxygen saturations and they were in the 70s to 80s. On arrival to the ED, her pulse oximeter was reported as 70-80% on 6 L/m of nasal cannula. ABG revealed pH of 7.4, PCO2 of 38, and PO2 of 70.5. She was subsequently placed on a nonrebreather mask. Her chest x-ray revealed mild bibasilar opacities, mildly worsened and CT scan of the chest without contrast revealed bilateral lower lobes infiltrates and a few small nodules noted. She was admitted for further evaluation and management. MD requested SLP for swallow evaluation. Pt had a barium swallow on 01/01/2015 which showed silent aspiration of contrast. During previous admission last week, she had EGD with dilation on 03/07/2015. Subjective: Pt seen upright in Hausted chair for MBSS Assessment / Plan / Recommendation CHL IP CLINICAL IMPRESSIONS 03/17/2015 Therapy Diagnosis Mild oral phase dysphagia;Mild  pharyngeal phase dysphagia;Moderate pharyngeal phase dysphagia Clinical Impression Mild oral phase (likely due to lesions along tongue and palate) and mild/mod pharyngeal phase dysphagia characterized by premature spillage with delay in swallow initiation with thin liquids, decreased tongue base retraction, epiglottic deflection and hyolaryngeal excursion resulting in variable trace penetration of thin liquids. Pt also with mild/mod vallecular residue post swallow; penetration occurred after the swallow occasionally with thin liquids when vallecular residue trickled into laryngeal vestibule (down underside of epiglottis). Recommend D3/mech soft with thin liquids with use of strategies: small sips, clear throat, repeat swallow with liquids. SLP will follow while in acute setting for education regarding effects of radiation therapy on swallow, aspiration risks, and compensatory strategies. Pt would likely benefit from [facility] or outpatient SLP for this, however she declines stating that it is cost prohibitive for her. SLP will attempt to provide pt with information while in acute setting.  Impact on safety and function Mild aspiration risk   CHL IP TREATMENT RECOMMENDATION 03/17/2015 Treatment Recommendations Therapy as outlined in treatment plan below   Prognosis 03/17/2015 Prognosis for Safe Diet Advancement Fair Barriers to Reach Goals -- Barriers/Prognosis Comment -- CHL IP DIET RECOMMENDATION 03/17/2015 SLP Diet Recommendations Dysphagia 3 (Mech soft) solids;Thin liquid Liquid Administration via Cup;Straw Medication Administration Whole meds with liquid Compensations Slow rate;Small sips/bites;Multiple dry swallows after each bite/sip;Clear throat intermittently;Effortful swallow Postural Changes Remain semi-upright after after feeds/meals (Comment);Seated upright at 90 degrees   CHL IP OTHER RECOMMENDATIONS 03/17/2015 Recommended Consults -- Oral Care Recommendations Oral care BID;Patient independent with oral care  Other Recommendations Clarify dietary restrictions   CHL IP FOLLOW UP RECOMMENDATIONS 03/17/2015 Follow up Recommendations Other (comment)   CHL IP FREQUENCY AND DURATION 03/17/2015 Speech Therapy Frequency (ACUTE ONLY) min 1 x/week Treatment Duration 1 week      CHL IP ORAL PHASE 03/17/2015 Oral Phase WFL Oral - Pudding Teaspoon -- Oral - Pudding Cup -- Oral - Honey Teaspoon -- Oral - Honey Cup -- Oral - Nectar Teaspoon -- Oral - Nectar Cup -- Oral - Nectar Straw -- Oral - Thin Teaspoon -- Oral - Thin Cup -- Oral - Thin Straw -- Oral - Puree -- Oral - Mech Soft -- Oral - Regular -- Oral - Multi-Consistency -- Oral - Pill -- Oral Phase - Comment --  CHL IP PHARYNGEAL PHASE 03/17/2015 Pharyngeal Phase Impaired Pharyngeal- Pudding Teaspoon -- Pharyngeal -- Pharyngeal- Pudding Cup -- Pharyngeal -- Pharyngeal- Honey Teaspoon -- Pharyngeal -- Pharyngeal- Honey Cup -- Pharyngeal -- Pharyngeal- Nectar Teaspoon -- Pharyngeal -- Pharyngeal- Nectar Cup Delayed swallow initiation-vallecula;Reduced epiglottic inversion;Reduced laryngeal elevation;Reduced airway/laryngeal closure;Reduced tongue base retraction;Penetration/Aspiration during swallow;Penetration/Apiration after swallow;Pharyngeal residue - valleculae Pharyngeal Material does not enter airway;Material enters airway, remains ABOVE vocal cords then ejected out Pharyngeal- Nectar Straw -- Pharyngeal -- Pharyngeal- Thin Teaspoon -- Pharyngeal -- Pharyngeal- Thin Cup Delayed swallow initiation-vallecula;Reduced epiglottic inversion;Reduced laryngeal elevation;Reduced airway/laryngeal closure;Reduced tongue base retraction;Penetration/Aspiration during swallow;Penetration/Apiration after swallow;Trace aspiration;Pharyngeal residue - valleculae Pharyngeal Material does not enter airway;Material enters airway, CONTACTS cords and then ejected out;Material enters airway, remains ABOVE vocal cords and not ejected out Pharyngeal- Thin Straw Delayed swallow  initiation-vallecula;Delayed swallow initiation-pyriform sinuses;Reduced epiglottic inversion;Reduced laryngeal elevation;Reduced airway/laryngeal closure;Penetration/Aspiration during swallow;Pharyngeal residue - valleculae;Lateral channel residue Pharyngeal Material does not enter airway;Material enters airway, remains ABOVE vocal cords then ejected out;Material enters airway, remains ABOVE vocal cords and not ejected out Pharyngeal- Puree Delayed swallow initiation-vallecula;Reduced epiglottic inversion;Reduced tongue base retraction;Pharyngeal residue - valleculae Pharyngeal -- Pharyngeal- Mechanical Soft Delayed swallow initiation-vallecula;Reduced tongue base retraction;Pharyngeal residue - valleculae Pharyngeal -- Pharyngeal- Regular -- Pharyngeal -- Pharyngeal- Multi-consistency -- Pharyngeal -- Pharyngeal- Pill WFL Pharyngeal -- Pharyngeal Comment --  CHL IP CERVICAL ESOPHAGEAL PHASE 03/17/2015 Cervical Esophageal Phase Impaired Pudding Teaspoon -- Pudding Cup -- Honey Teaspoon -- Honey Cup -- Nectar Teaspoon -- Nectar Cup -- Nectar Straw -- Thin Teaspoon -- Thin Cup -- Thin Straw Prominent cricopharyngeal segment Puree -- Mechanical Soft -- Regular -- Multi-consistency -- Pill -- Cervical Esophageal Comment -- Thank you, Genene Churn, CCC-SLP [phone] PORTER,DABNEY 03/17/2015, 9:58 PM               Microbiology: Recent Results (from the past 240 hour(s))  MRSA PCR Screening     Status: None   Collection Time: 03/16/15  2:30 AM  Result Value Ref Range Status   MRSA by PCR NEGATIVE NEGATIVE Final    Comment:        The GeneXpert MRSA Assay (FDA approved for NASAL specimens only), is one component of a comprehensive MRSA colonization surveillance program. It is not intended to diagnose MRSA infection nor to guide or monitor treatment for MRSA infections.   Culture, respiratory (NON-Expectorated)     Status: None (Preliminary result)   Collection Time: 03/16/15  6:07 AM  Result Value  Ref Range Status   Specimen Description SPU  Final   Special Requests NONE  Final   Gram Stain   Final    THIS SPECIMEN IS ACCEPTABLE FOR SPUTUM CULTURE ABUNDANT WBC PRESENT, PREDOMINANTLY PMN MODERATE SQUAMOUS EPITHELIAL CELLS PRESENT ABUNDANT GRAM POSITIVE COCCI IN PAIRS MODERATE GRAM POSITIVE RODS    Culture   Final    NORMAL OROPHARYNGEAL FLORA Performed at [facility]    Report Status PENDING  Incomplete  Culture, expectorated sputum-assessment     Status: None   Collection Time: 03/16/15  6:09 AM  Result Value Ref Range Status   Specimen Description SPU  Final   Special Requests NONE  Final   Sputum evaluation THIS SPECIMEN IS ACCEPTABLE FOR SPUTUM CULTURE  Final   Report Status 03/16/2015 FINAL  Final     Labs: Basic Metabolic Panel:  Recent Labs Lab 03/12/15 0632 03/15/15 2100 03/16/15 0505 03/17/15 0417 03/18/15 0545  NA 137 135 136 139 139  K 3.6 3.4* 3.7 3.6 3.6  CL 103 101 106 107 105  CO2 '24 26 25 27 28  '$ GLUCOSE 102* 157* 153* 139* 124*  BUN '8 12 9 6 6  '$ CREATININE 0.39* 0.40* 0.35* 0.40* 0.41*  CALCIUM 8.4* 8.4* 7.8* 7.8* 8.2*   Liver Function Tests:  Recent Labs Lab 03/15/15 2100 03/16/15 0505  AST 21 18  ALT 24 21  ALKPHOS 71 66  BILITOT 0.6 0.3  PROT 5.2* 4.8*  ALBUMIN 2.4* 2.2*   No results for input(s): LIPASE, AMYLASE in the last 168 hours. No results for input(s): AMMONIA in the last 168 hours. CBC:  Recent Labs Lab 03/15/15 2100 03/16/15 0505 03/17/15 0417 03/18/15 0545  WBC 3.2* 3.2* 2.9* 4.6  NEUTROABS 2.7  --   --   --   HGB 11.1* 10.7* 10.4* 11.6*  HCT 33.6* 32.8* 31.3* 36.0  MCV 87.0 88.2 88.2 88.9  PLT 125* 119* 128* 142*   Cardiac Enzymes:  Recent Labs Lab 03/15/15 2100  TROPONINI <0.03   BNP: BNP (last 3 results)  Recent Labs  03/15/15 2100  BNP 81.0    ProBNP (last 3 results) No results for input(s): PROBNP in the last 8760 hours.  CBG:  Recent Labs Lab 03/17/15 1207 03/17/15 1631  03/17/15 1936 03/17/15 2048 03/18/15 0802  GLUCAP 79 69 92 112* 112*       Signed:  Annsley Akkerman  [facility] 03/18/2015, 10:07 AM

## 2015-03-18 NOTE — Progress Notes (Signed)
Pt states that mouth pain is not relieved with medications that are ordered. [facility] page is see if home medication MScotin '60mg'$  BID can be started

## 2015-03-19 ENCOUNTER — Encounter: Payer: Medicare Other | Admitting: Gastroenterology

## 2015-03-19 LAB — CULTURE, RESPIRATORY W GRAM STAIN

## 2015-03-19 LAB — CULTURE, RESPIRATORY: CULTURE: NORMAL

## 2015-03-19 NOTE — Progress Notes (Signed)
  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 She received a dose of 37.5 gray in 15 fractions at 2.5 gray per fraction using and IMRT technique.  Narrative: The patient tolerated radiation treatment relatively well.   No unexpected difficulties during the course of treatment. Some skin irritation as expected.  Plan: The patient has completed radiation treatment. The patient will return to radiation oncology clinic for routine followup in one month. I advised the patient to call or return sooner if they have any questions or concerns related to their recovery or treatment. ________________________________  Jodelle Gross, M.D., Ph.D.

## 2015-03-21 ENCOUNTER — Telehealth: Payer: Self-pay | Admitting: Radiation Therapy

## 2015-03-24 NOTE — Telephone Encounter (Signed)
Opened in error

## 2015-03-27 ENCOUNTER — Other Ambulatory Visit (HOSPITAL_COMMUNITY): Payer: Self-pay | Admitting: Internal Medicine

## 2015-03-27 ENCOUNTER — Ambulatory Visit (HOSPITAL_COMMUNITY)
Admission: RE | Admit: 2015-03-27 | Discharge: 2015-03-27 | Disposition: A | Discharge status: Home / Self Care | Payer: Medicare Other | Source: Ambulatory Visit | Attending: Internal Medicine | Admitting: Internal Medicine

## 2015-03-27 DIAGNOSIS — R918 Other nonspecific abnormal finding of lung field: Secondary | ICD-10-CM | POA: Diagnosis not present

## 2015-03-27 DIAGNOSIS — R0989 Other specified symptoms and signs involving the circulatory and respiratory systems: Secondary | ICD-10-CM | POA: Diagnosis not present

## 2015-03-27 DIAGNOSIS — R05 Cough: Secondary | ICD-10-CM | POA: Diagnosis not present

## 2015-03-27 DIAGNOSIS — R509 Fever, unspecified: Secondary | ICD-10-CM | POA: Insufficient documentation

## 2015-03-27 DIAGNOSIS — R0902 Hypoxemia: Secondary | ICD-10-CM | POA: Insufficient documentation

## 2015-04-01 LAB — FUNGUS CULTURE W SMEAR: FUNGAL SMEAR: NONE SEEN

## 2015-04-14 ENCOUNTER — Ambulatory Visit
Admission: RE | Admit: 2015-04-14 | Discharge: 2015-04-14 | Disposition: A | Discharge status: Home / Self Care | Payer: Medicare Other | Source: Ambulatory Visit | Attending: Radiation Oncology | Admitting: Radiation Oncology

## 2015-04-14 ENCOUNTER — Encounter: Payer: Self-pay | Admitting: Radiation Oncology

## 2015-04-14 VITALS — BP 123/83 | HR 83 | Temp 98.6°F | Resp 20 | Ht 63.0 in | Wt 145.0 lb

## 2015-04-14 DIAGNOSIS — C7931 Secondary malignant neoplasm of brain: Secondary | ICD-10-CM

## 2015-04-14 NOTE — Progress Notes (Signed)
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 States that she feels a knot in her right neck area.                                ALLERGIES:  is allergic to penicillins; other; codeine; and tape.  Meds: Current Outpatient Prescriptions  Medication Sig Dispense Refill  . acyclovir (ZOVIRAX) 200 MG capsule Take 2 capsules (400 mg total) by mouth 3 (three) times daily. Continue through 03/23/15 or until completed.    Marland Kitchen ALPRAZolam (XANAX) 0.5 MG tablet Take 0.5 mg by mouth 3 (three) times daily as needed for anxiety.     Marland Kitchen amitriptyline (ELAVIL) 25 MG tablet Take 25 mg by mouth at bedtime.    Marland Kitchen amLODipine (NORVASC) 10 MG tablet Take 10 mg by mouth daily.    Marland Kitchen levothyroxine (SYNTHROID, LEVOTHROID) 137 MCG tablet Take 137 mcg by mouth daily before breakfast.    . magic mouthwash w/lidocaine SOLN Take 5 mLs by mouth 4 (four) times daily as needed for mouth pain. 400 mL 0  . Magnesium 250 MG TABS Take 1 tablet by mouth daily.    Marland Kitchen morphine (MS CONTIN) 60 MG 12 hr tablet Take 1 tablet (60 mg total) by mouth every 12 (twelve) hours. Use this medication with caution as it can cause confusion and can affect her breathing.    . ondansetron (ZOFRAN ODT) 8 MG disintegrating tablet Take 1 tablet (8 mg total) by mouth every 8 (eight) hours as needed for  nausea or vomiting. 20 tablet 0  . Oxycodone HCl 20 MG TABS Take 1 tablet by mouth every 4 (four) hours as needed (pain).    . pantoprazole (PROTONIX) 40 MG tablet Take 1 tablet (40 mg total) by mouth 2 (two) times daily. 60 tablet 0  . polyethylene glycol (MIRALAX / GLYCOLAX) packet Take 17 g by mouth every other day. For constipation    . Simethicone (GAS-X EXTRA STRENGTH PO) Take 1 tablet by mouth daily as needed (gas relief).    . sucralfate (CARAFATE) 1 G tablet Take 1 tablet (1 g total) by mouth 4 (four) times daily -  with meals and at bedtime. 60 tablet 0  . cefUROXime (CEFTIN) 500 MG tablet Take 1 tablet (500 mg total) by mouth 2 (two) times daily with a meal. Antibiotic be taken as directed, starting tomorrow on 03/19/15. (Patient not taking: Reported on 04/14/2015) 8 tablet 0  . fluconazole (DIFLUCAN) 100 MG tablet Take 1 tablet (100 mg total) by mouth daily. Continue through 03/23/15 or until completion. (Patient not taking: Reported on 04/14/2015) 11 tablet 0  . glimepiride (AMARYL) 1 MG tablet Take a half a tablet daily as needed for a blood sugar greater 150 or greater. (Patient not taking: Reported on 04/14/2015)  15 tablet 2  . guaiFENesin-dextromethorphan (ROBITUSSIN DM) 100-10 MG/5ML syrup Take 5 mLs by mouth every 4 (four) hours as needed for cough. (Patient not taking: Reported on 04/14/2015)    . Wound Dressings (SONAFINE) Apply 1 application topically daily.     No current facility-administered medications for this encounter.    Physical Findings: The patient is in no acute distress. Patient is alert and oriented.  height is '5\' 3"'$  (1.6 m) and weight is 145 lb (65.772 kg). Her oral temperature is 98.6 F (37 C). Her blood pressure is 123/83 and her pulse is 83. Her respiration is 20. Marland Kitchen   General: Well-developed, in no acute distress HEENT: Normocephalic, atraumatic Cardiovascular: Regular rate and rhythm Respiratory: Clear to auscultation bilaterally GI: Soft, nontender, normal  bowel sounds Extremities: No edema present No palpable cervical, supraclavicular or axillary lymphoadenopathy No thrush or mucositis in oral cavity No palpable tumors or lesions in right scalp and neck Knot in right neck is palpated, likely a surgical stitch.   Lab Findings: Lab Results  Component Value Date   WBC 4.6 03/18/2015   HGB 11.6* 03/18/2015   HCT 36.0 03/18/2015   MCV 88.9 03/18/2015   PLT 142* 03/18/2015     Radiographic Findings: Dg Chest 2 View  03/27/2015  CLINICAL DATA:  Persistent cough, fever, congestion, follow-up pneumonia EXAM: CHEST  2 VIEW COMPARISON:  03/15/2015 FINDINGS: Cardiomediastinal silhouette is stable. Persistent streaky infiltrate in lingula and left base suspicious for atypical infection/pneumonia. Persistent right base medially atelectasis or infiltrate. No pulmonary edema. IMPRESSION: Persistent streaky infiltrate in lingula and left base suspicious for atypical infection/pneumonia. Persistent right base medially atelectasis or infiltrate. No pulmonary edema. Electronically Signed   By: Lahoma Crocker M.D.   On: 03/27/2015 13:47   Dg Chest 2 View  03/15/2015  CLINICAL DATA:  Recently diagnosed with pneumonia. Severe shortness of breath and generalized weakness. Initial encounter. EXAM: CHEST  2 VIEW COMPARISON:  Chest radiograph performed 03/03/2015 FINDINGS: The lungs are well-aerated. Mild bibasilar opacities raise concern for mildly worsening multifocal pneumonia. There is no evidence of pleural effusion or pneumothorax. The heart is normal in size; the mediastinal contour is within normal limits. No acute osseous abnormalities are seen. Clips are noted within the right upper quadrant, reflecting prior cholecystectomy. IMPRESSION: Mild bibasilar opacities are mildly worsened, raising concern for mildly worsening multifocal pneumonia. Electronically Signed   By: Garald Balding M.D.   On: 03/15/2015 21:38   Ct Head Wo Contrast  03/15/2015  CLINICAL  DATA:  Frequent falls, confusion. EXAM: CT HEAD WITHOUT CONTRAST TECHNIQUE: Contiguous axial images were obtained from the base of the skull through the vertex without intravenous contrast. COMPARISON:  CT scan of May 04, 2014. FINDINGS: Status post left frontal craniotomy. Otherwise bony calvarium appears intact. Stable small focus of postoperative encephalomalacia in superior portion of left parietal cortex. No mass effect or midline shift is noted. Ventricular size is within normal limits. There is no evidence of mass lesion, hemorrhage or acute infarction. IMPRESSION: Status post left frontal craniotomy. No acute intracranial abnormality seen. Electronically Signed   By: Marijo Conception, M.D.   On: 03/15/2015 21:34   Ct Chest Wo Contrast  03/15/2015  CLINICAL DATA:  Shortness of breath and weakness EXAM: CT CHEST WITHOUT CONTRAST TECHNIQUE: Multidetector CT imaging of the chest was performed following the standard protocol without IV contrast. COMPARISON:  Chest x-ray from earlier in the same day FINDINGS: Lungs are well aerated bilaterally. Bilateral lower lobe infiltrate  is noted left greater than right. Few small less than 5 mm nodules are noted as well. Thoracic inlet is within normal limits. Aortic calcifications are seen without aneurysmal dilatation. No hilar or mediastinal adenopathy is seen. The visualized upper abdomen is within normal limits. The gallbladder has been surgically removed. The bony structures are within normal limits. IMPRESSION: Bilateral lower lobe infiltrates. A few small nodules are noted which may be postinflammatory as well. No other focal abnormality is seen. Electronically Signed   By: Inez Catalina M.D.   On: 03/15/2015 23:18   Dg Swallowing Func-speech Pathology  03/18/2015  Ephraim Hamburger, CCC-SLP     03/18/2015  9:50 AM Objective Swallowing Evaluation: MBS-Modified Barium Swallow Study Patient Details Name: Valincia Touch MRN: [id number] Date of Birth: 1955/06/03  Today's Date: 03/17/2015 Time: SLP Start Time (ACUTE ONLY): 1254-SLP Stop Time (ACUTE ONLY): 1326 SLP Time Calculation (min) (ACUTE ONLY): 32 min Past Medical History: Past Medical History Diagnosis Date . Essential hypertension  . Anxiety  . Depression  . COPD (chronic obstructive pulmonary disease) ([place])  . GERD (gastroesophageal reflux disease)  . Headache(784.0)  . Arthritis  . Hx of adenomatous colonic polyps  . Emphysema  . Cataract    1 lens implaqnt right eye,intact on left eye . Goiter  . Fibromyalgia  . Pulmonary emboli ([place]) 11/04/11   Right upper lobe and r lower lobe PE . Lung nodule seen on imaging study 11/04/11 CT   52m LLL . DVT (deep venous thrombosis) [place] 12/09[place]2011 . [place] [place] 12/09[place]2011 . Hyperlipidemia  . History of radiation therapy 10/06/11   SRS 15Gy 119f brain . UTI (lower urinary tract infection)  . Borderline diabetes mellitus  . [place]ast[place] brain [place]   Left frontoparietal region . [place] . S/P radiation therapy 05/31/14 SRS   Brain . PONV (postoperative nausea and vomiting)  Past Surgical History: Past Surgical History Procedure Laterality Date . Abdominal hysterectomy  1996 . Cholecystectomy  2000 . Shoulder surgery Right 1998 . Eye surgery  2001 . Foot surgery Left 2005 . Craniotomy  09/15/2011   Procedure: CRANIOTOMY TUMOR EXCISION;  Surgeon: RoHosie SpangleMD;  [facility];  Service: Neurosurgery;  Laterality: N/A;  Craniotomy resection of tumor with stealth . Cataract extraction  2011   With lens implant . Total thyroidectomy  10-29-2013   [facility]   . Video bronchoscopy with endobronchial ultrasound N/A 04/11/2014   Procedure: VIDEO BRONCHOSCOPY WITH ENDOBRONCHIAL ULTRASOUND;  Surgeon: StMelrose NakayamaMD;  [facility] Service: Thoracic;  Laterality: N/A; . Esophagogastroduodenoscopy (egd) with propofol N/A 03/07/2015   Procedure: ESOPHAGOGASTRODUODENOSCOPY  (EGD) WITH PROPOFOL;  Surgeon: RoDaneil DolinMD;  [facility];  Service: Endoscopy;  Laterality: N/A; . Esophageal biopsy  03/07/2015   Procedure: BIOPSY;  Surgeon: RoDaneil DolinMD;  [facility];  Service: Endoscopy;; HPI: JuYerlin Gasparyans a [id number] old lady who was discharged from the hospital on 03/13/15 after being treated for HCAP, herpes and candidal esophagitis, and mucositis. She seemed to be doing well when she was discharged and was eating well. However, during the 24 hours prior to admission, she had been having more generalized weakness, chest congestion, and respiratory distress associated with altered mental status. Her daughter is in a nurse and apparently she checked the patient's oxygen saturations and they were in the 70s to 80s. On arrival to the ED, her  pulse oximeter was reported as 70-80% on 6 L/m of nasal cannula. ABG revealed pH of 7.4, PCO2 of 38, and PO2 of 70.5. She was subsequently placed on a nonrebreather mask. Her chest x-ray revealed mild bibasilar opacities, mildly worsened and CT scan of the chest without contrast revealed bilateral lower lobes infiltrates and a few small nodules noted. She was admitted for further evaluation and management. MD requested SLP for swallow evaluation. Pt had a barium swallow on 01/01/2015 which showed silent aspiration of contrast. During previous admission last week, she had EGD with dilation on 03/07/2015. Subjective: Pt seen upright in [place] chair for MBSS Assessment / Plan / Recommendation CHL IP CLINICAL IMPRESSIONS 03/17/2015 Therapy Diagnosis Mild oral phase dysphagia;Mild pharyngeal phase dysphagia;Moderate pharyngeal phase dysphagia Clinical Impression Mild oral phase (likely due to lesions along tongue and palate) and mild/mod pharyngeal phase dysphagia characterized by premature spillage with delay in swallow initiation with thin liquids, decreased tongue base retraction, epiglottic deflection and hyolaryngeal  excursion resulting in variable trace penetration of thin liquids. Pt also with mild/mod vallecular residue post swallow; penetration occurred after the swallow occasionally with thin liquids when vallecular residue trickled into laryngeal vestibule (down underside of epiglottis). Recommend D3/mech soft with thin liquids with use of strategies: small sips, clear throat, repeat swallow with liquids. SLP will follow while in acute setting for education regarding effects of radiation therapy on swallow, aspiration risks, and compensatory strategies. Pt would likely benefit from [facility] or outpatient SLP for this, however she declines stating that it is cost prohibitive for her. SLP will attempt to provide pt with information while in acute setting.  Impact on safety and function Mild aspiration risk   CHL IP TREATMENT RECOMMENDATION 03/17/2015 Treatment Recommendations Therapy as outlined in treatment plan below   Prognosis 03/17/2015 Prognosis for Safe Diet Advancement Fair Barriers to Reach Goals -- Barriers/Prognosis Comment -- CHL IP DIET RECOMMENDATION 03/17/2015 SLP Diet Recommendations Dysphagia 3 (Mech soft) solids;Thin liquid Liquid Administration via Cup;Straw Medication Administration Whole meds with liquid Compensations Slow rate;Small sips/bites;Multiple dry swallows after each bite/sip;Clear throat intermittently;Effortful swallow Postural Changes Remain semi-upright after after feeds/meals (Comment);Seated upright at 90 degrees   CHL IP OTHER RECOMMENDATIONS 03/17/2015 Recommended Consults -- Oral Care Recommendations Oral care BID;Patient independent with oral care Other Recommendations Clarify dietary restrictions   CHL IP FOLLOW UP RECOMMENDATIONS 03/17/2015 Follow up Recommendations Other (comment)   CHL IP FREQUENCY AND DURATION 03/17/2015 Speech Therapy Frequency (ACUTE ONLY) min 1 x/week Treatment Duration 1 week      CHL IP ORAL PHASE 03/17/2015 Oral Phase WFL Oral - Pudding Teaspoon -- Oral - Pudding  Cup -- Oral - Honey Teaspoon -- Oral - Honey Cup -- Oral - Nectar Teaspoon -- Oral - Nectar Cup -- Oral - Nectar Straw -- Oral - Thin Teaspoon -- Oral - Thin Cup -- Oral - Thin Straw -- Oral - Puree -- Oral - Mech Soft -- Oral - Regular -- Oral - Multi-Consistency -- Oral - Pill -- Oral Phase - Comment --  CHL IP PHARYNGEAL PHASE 03/17/2015 Pharyngeal Phase Impaired Pharyngeal- Pudding Teaspoon -- Pharyngeal -- Pharyngeal- Pudding Cup -- Pharyngeal -- Pharyngeal- Honey Teaspoon -- Pharyngeal -- Pharyngeal- Honey Cup -- Pharyngeal -- Pharyngeal- Nectar Teaspoon -- Pharyngeal -- Pharyngeal- Nectar Cup Delayed swallow initiation-vallecula;Reduced epiglottic inversion;Reduced laryngeal elevation;Reduced airway/laryngeal closure;Reduced tongue base retraction;Penetration/Aspiration during swallow;Penetration/Apiration after swallow;Pharyngeal residue - valleculae Pharyngeal Material does not enter airway;Material enters airway, remains ABOVE vocal cords then ejected out Pharyngeal- Nectar Straw -- Pharyngeal --  Pharyngeal- Thin Teaspoon -- Pharyngeal -- Pharyngeal- Thin Cup Delayed swallow initiation-vallecula;Reduced epiglottic inversion;Reduced laryngeal elevation;Reduced airway/laryngeal closure;Reduced tongue base retraction;Penetration/Aspiration during swallow;Penetration/Apiration after swallow;Trace aspiration;Pharyngeal residue - valleculae Pharyngeal Material does not enter airway;Material enters airway, CONTACTS cords and then ejected out;Material enters airway, remains ABOVE vocal cords and not ejected out Pharyngeal- Thin Straw Delayed swallow initiation-vallecula;Delayed swallow initiation-pyriform sinuses;Reduced epiglottic inversion;Reduced laryngeal elevation;Reduced airway/laryngeal closure;Penetration/Aspiration during swallow;Pharyngeal residue - valleculae;Lateral channel residue Pharyngeal Material does not enter airway;Material enters airway, remains ABOVE vocal cords then ejected out;Material  enters airway, remains ABOVE vocal cords and not ejected out Pharyngeal- Puree Delayed swallow initiation-vallecula;Reduced epiglottic inversion;Reduced tongue base retraction;Pharyngeal residue - valleculae Pharyngeal -- Pharyngeal- Mechanical Soft Delayed swallow initiation-vallecula;Reduced tongue base retraction;Pharyngeal residue - valleculae Pharyngeal -- Pharyngeal- Regular -- Pharyngeal -- Pharyngeal- Multi-consistency -- Pharyngeal -- Pharyngeal- Pill WFL Pharyngeal -- Pharyngeal Comment --  CHL IP CERVICAL ESOPHAGEAL PHASE 03/17/2015 Cervical Esophageal Phase Impaired Pudding Teaspoon -- Pudding Cup -- Honey Teaspoon -- Honey Cup -- Nectar Teaspoon -- Nectar Cup -- Nectar Straw -- Thin Teaspoon -- Thin Cup -- Thin Straw Prominent cricopharyngeal segment Puree -- Mechanical Soft -- Regular -- Multi-consistency -- Pill -- Cervical Esophageal Comment -- Thank you, Genene Churn, Jemison PORTER,DABNEY 03/17/2015, 9:58 PM               Impression: The patient is recovering from the effects of radiation.   Plan:  She is scheduled for a CT scan at [facility] tomorrow, will follow-up on results. Brain MRI and radiation oncology follow up 3 months after previous XRT to brain.    ------------------------------------------------  Jodelle Gross, MD, PhD  This document serves as a record of services personally performed by Kyung Rudd, MD. It was created on his behalf by Derek Mound, a trained medical scribe. The creation of this record is based on the scribe's personal observations and the provider's statements to them. This document has been checked and approved by the attending provider.

## 2015-04-14 NOTE — Progress Notes (Signed)
Follow up s/p whole brain radiation 02/06/15-02/26/15, pain 10/10, shooting pain in right ear, neck and back of head radaitiong to shoulder and right chest, whispers, last pain med taken at 5am today, uses MMW and carafate, swallowing better, d/c hospital 03/18/15, had swallow study on 03/15/15, weak, unsteady,  Fatigued 9:21 AM' BP 123/83 mmHg  Pulse 83  Temp(Src) 98.6 F (37 C) (Oral)  Resp 20  Ht '5\' 3"'$  (1.6 m)  Wt 145 lb (65.772 kg)  BMI 25.69 kg/m2  Wt Readings from Last 3 Encounters:  04/14/15 145 lb (65.772 kg)  03/16/15 147 lb 0.8 oz (66.7 kg)  03/03/15 146 lb (66.225 kg)

## 2015-04-18 ENCOUNTER — Inpatient Hospital Stay (HOSPITAL_COMMUNITY)
Admission: EM | Admit: 2015-04-18 | Discharge: 2015-04-25 | DRG: 177 | Disposition: A | Discharge status: Home / Self Care | Payer: Medicare Other | Attending: Internal Medicine | Admitting: Internal Medicine

## 2015-04-18 ENCOUNTER — Emergency Department (HOSPITAL_COMMUNITY): Payer: Medicare Other

## 2015-04-18 ENCOUNTER — Encounter (HOSPITAL_COMMUNITY): Payer: Self-pay | Admitting: *Deleted

## 2015-04-18 DIAGNOSIS — Z87891 Personal history of nicotine dependence: Secondary | ICD-10-CM

## 2015-04-18 DIAGNOSIS — E785 Hyperlipidemia, unspecified: Secondary | ICD-10-CM | POA: Diagnosis present

## 2015-04-18 DIAGNOSIS — J189 Pneumonia, unspecified organism: Secondary | ICD-10-CM | POA: Diagnosis present

## 2015-04-18 DIAGNOSIS — G894 Chronic pain syndrome: Secondary | ICD-10-CM | POA: Diagnosis present

## 2015-04-18 DIAGNOSIS — Z9221 Personal history of antineoplastic chemotherapy: Secondary | ICD-10-CM

## 2015-04-18 DIAGNOSIS — Z8249 Family history of ischemic heart disease and other diseases of the circulatory system: Secondary | ICD-10-CM

## 2015-04-18 DIAGNOSIS — E860 Dehydration: Secondary | ICD-10-CM | POA: Diagnosis present

## 2015-04-18 DIAGNOSIS — Z9981 Dependence on supplemental oxygen: Secondary | ICD-10-CM

## 2015-04-18 DIAGNOSIS — Z66 Do not resuscitate: Secondary | ICD-10-CM | POA: Diagnosis present

## 2015-04-18 DIAGNOSIS — K219 Gastro-esophageal reflux disease without esophagitis: Secondary | ICD-10-CM | POA: Diagnosis present

## 2015-04-18 DIAGNOSIS — M1990 Unspecified osteoarthritis, unspecified site: Secondary | ICD-10-CM | POA: Diagnosis present

## 2015-04-18 DIAGNOSIS — Z825 Family history of asthma and other chronic lower respiratory diseases: Secondary | ICD-10-CM

## 2015-04-18 DIAGNOSIS — R05 Cough: Secondary | ICD-10-CM | POA: Diagnosis not present

## 2015-04-18 DIAGNOSIS — Z800 Family history of malignant neoplasm of digestive organs: Secondary | ICD-10-CM

## 2015-04-18 DIAGNOSIS — Z9049 Acquired absence of other specified parts of digestive tract: Secondary | ICD-10-CM

## 2015-04-18 DIAGNOSIS — Z90710 Acquired absence of both cervix and uterus: Secondary | ICD-10-CM

## 2015-04-18 DIAGNOSIS — Z86711 Personal history of pulmonary embolism: Secondary | ICD-10-CM

## 2015-04-18 DIAGNOSIS — R1310 Dysphagia, unspecified: Secondary | ICD-10-CM

## 2015-04-18 DIAGNOSIS — M797 Fibromyalgia: Secondary | ICD-10-CM | POA: Diagnosis present

## 2015-04-18 DIAGNOSIS — Z79891 Long term (current) use of opiate analgesic: Secondary | ICD-10-CM

## 2015-04-18 DIAGNOSIS — D696 Thrombocytopenia, unspecified: Secondary | ICD-10-CM | POA: Diagnosis present

## 2015-04-18 DIAGNOSIS — E119 Type 2 diabetes mellitus without complications: Secondary | ICD-10-CM | POA: Diagnosis present

## 2015-04-18 DIAGNOSIS — E039 Hypothyroidism, unspecified: Secondary | ICD-10-CM | POA: Diagnosis present

## 2015-04-18 DIAGNOSIS — E876 Hypokalemia: Secondary | ICD-10-CM | POA: Diagnosis present

## 2015-04-18 DIAGNOSIS — Z515 Encounter for palliative care: Secondary | ICD-10-CM | POA: Insufficient documentation

## 2015-04-18 DIAGNOSIS — J449 Chronic obstructive pulmonary disease, unspecified: Secondary | ICD-10-CM | POA: Diagnosis present

## 2015-04-18 DIAGNOSIS — C73 Malignant neoplasm of thyroid gland: Secondary | ICD-10-CM | POA: Diagnosis present

## 2015-04-18 DIAGNOSIS — J9601 Acute respiratory failure with hypoxia: Secondary | ICD-10-CM | POA: Diagnosis present

## 2015-04-18 DIAGNOSIS — Z85850 Personal history of malignant neoplasm of thyroid: Secondary | ICD-10-CM

## 2015-04-18 DIAGNOSIS — Z923 Personal history of irradiation: Secondary | ICD-10-CM

## 2015-04-18 DIAGNOSIS — Z833 Family history of diabetes mellitus: Secondary | ICD-10-CM

## 2015-04-18 DIAGNOSIS — E890 Postprocedural hypothyroidism: Secondary | ICD-10-CM | POA: Diagnosis present

## 2015-04-18 DIAGNOSIS — J690 Pneumonitis due to inhalation of food and vomit: Secondary | ICD-10-CM | POA: Diagnosis not present

## 2015-04-18 DIAGNOSIS — Z7189 Other specified counseling: Secondary | ICD-10-CM | POA: Insufficient documentation

## 2015-04-18 DIAGNOSIS — Z820 Family history of epilepsy and other diseases of the nervous system: Secondary | ICD-10-CM

## 2015-04-18 DIAGNOSIS — F419 Anxiety disorder, unspecified: Secondary | ICD-10-CM | POA: Diagnosis present

## 2015-04-18 DIAGNOSIS — C7931 Secondary malignant neoplasm of brain: Secondary | ICD-10-CM | POA: Diagnosis present

## 2015-04-18 DIAGNOSIS — I10 Essential (primary) hypertension: Secondary | ICD-10-CM | POA: Diagnosis present

## 2015-04-18 LAB — CBC WITH DIFFERENTIAL/PLATELET
Basophils Absolute: 0 10*3/uL (ref 0.0–0.1)
Basophils Relative: 0 %
EOS ABS: 0.3 10*3/uL (ref 0.0–0.7)
EOS PCT: 2 %
HCT: 44.3 % (ref 36.0–46.0)
HEMOGLOBIN: 13.9 g/dL (ref 12.0–15.0)
LYMPHS ABS: 0.6 10*3/uL — AB (ref 0.7–4.0)
Lymphocytes Relative: 5 %
MCH: 29.6 pg (ref 26.0–34.0)
MCHC: 31.4 g/dL (ref 30.0–36.0)
MCV: 94.5 fL (ref 78.0–100.0)
MONOS PCT: 8 %
Monocytes Absolute: 1.1 10*3/uL — ABNORMAL HIGH (ref 0.1–1.0)
NEUTROS PCT: 85 %
Neutro Abs: 10.6 10*3/uL — ABNORMAL HIGH (ref 1.7–7.7)
Platelets: 195 10*3/uL (ref 150–400)
RBC: 4.69 MIL/uL (ref 3.87–5.11)
RDW: 19.2 % — ABNORMAL HIGH (ref 11.5–15.5)
WBC: 12.5 10*3/uL — ABNORMAL HIGH (ref 4.0–10.5)

## 2015-04-18 LAB — COMPREHENSIVE METABOLIC PANEL
ALBUMIN: 3.7 g/dL (ref 3.5–5.0)
ALK PHOS: 94 U/L (ref 38–126)
ALT: 48 U/L (ref 14–54)
AST: 40 U/L (ref 15–41)
Anion gap: 11 (ref 5–15)
BUN: 13 mg/dL (ref 6–20)
CALCIUM: 8.9 mg/dL (ref 8.9–10.3)
CO2: 25 mmol/L (ref 22–32)
CREATININE: 1.22 mg/dL — AB (ref 0.44–1.00)
Chloride: 103 mmol/L (ref 101–111)
GFR calc Af Amer: 55 mL/min — ABNORMAL LOW (ref >60)
GFR calc non Af Amer: 48 mL/min — ABNORMAL LOW (ref >60)
GLUCOSE: 109 mg/dL — AB (ref 65–99)
Potassium: 3.9 mmol/L (ref 3.5–5.1)
SODIUM: 139 mmol/L (ref 135–145)
Total Bilirubin: 0.8 mg/dL (ref 0.3–1.2)
Total Protein: 6.7 g/dL (ref 6.5–8.1)

## 2015-04-18 LAB — URINALYSIS, ROUTINE W REFLEX MICROSCOPIC
BILIRUBIN URINE: NEGATIVE
GLUCOSE, UA: NEGATIVE mg/dL
Hgb urine dipstick: NEGATIVE
KETONES UR: NEGATIVE mg/dL
Leukocytes, UA: NEGATIVE
Nitrite: NEGATIVE
PH: 6.5 (ref 5.0–8.0)
Protein, ur: NEGATIVE mg/dL
Specific Gravity, Urine: <1.005 — ABNORMAL LOW (ref 1.005–1.030)

## 2015-04-18 LAB — BLOOD GAS, ARTERIAL
Acid-base deficit: 2.4 mmol/L — ABNORMAL HIGH (ref 0.0–2.0)
Bicarbonate: 21.7 mEq/L (ref 20.0–24.0)
Drawn by: 33100
O2 CONTENT: 4 L/min
O2 Saturation: 92.1 %
PCO2 ART: 49.4 mmHg — AB (ref 35.0–45.0)
PH ART: 7.294 — AB (ref 7.350–7.450)
Patient temperature: 98.7
TCO2: 15.6 mmol/L (ref 0–100)
pO2, Arterial: 75.8 mmHg — ABNORMAL LOW (ref 80.0–100.0)

## 2015-04-18 LAB — I-STAT CG4 LACTIC ACID, ED: Lactic Acid, Venous: 1.7 mmol/L (ref 0.5–2.0)

## 2015-04-18 LAB — LACTIC ACID, PLASMA: LACTIC ACID, VENOUS: 1.6 mmol/L (ref 0.5–2.0)

## 2015-04-18 MED ORDER — SODIUM CHLORIDE 0.9 % IV SOLN
Freq: Once | INTRAVENOUS | Status: AC
  Administered 2015-04-18: 1000 mL via INTRAVENOUS

## 2015-04-18 MED ORDER — IOHEXOL 300 MG/ML  SOLN
100.0000 mL | Freq: Once | INTRAMUSCULAR | Status: AC | PRN
  Administered 2015-04-19: 100 mL via INTRAVENOUS

## 2015-04-18 MED ORDER — SODIUM CHLORIDE 0.9 % IV BOLUS (SEPSIS)
2000.0000 mL | Freq: Once | INTRAVENOUS | Status: AC
  Administered 2015-04-18: 2000 mL via INTRAVENOUS

## 2015-04-18 MED ORDER — VANCOMYCIN HCL 500 MG IV SOLR
500.0000 mg | Freq: Once | INTRAVENOUS | Status: AC
  Administered 2015-04-19: 500 mg via INTRAVENOUS
  Filled 2015-04-18: qty 500

## 2015-04-18 MED ORDER — DEXTROSE 5 % IV SOLN
2.0000 g | Freq: Once | INTRAVENOUS | Status: AC
  Administered 2015-04-18: 2 g via INTRAVENOUS
  Filled 2015-04-18: qty 2

## 2015-04-18 NOTE — ED Notes (Signed)
Pt sitting up in bed, now verbal. Pt states she feels much better.

## 2015-04-18 NOTE — Progress Notes (Signed)
ANTIBIOTIC CONSULT NOTE-Preliminary  Pharmacy Consult for Vancomycin and Aztreonam Indication: Pneumonia  Allergies  Allergen Reactions  . Penicillins Swelling    " my brain swelled, and mymouth" Has patient had a PCN reaction causing immediate rash, facial/tongue/throat swelling, SOB or lightheadedness with hypotension: No Has patient had a PCN reaction causing severe rash involving mucus membranes or skin necrosis: No Has patient had a PCN reaction that required hospitalization No Has patient had a PCN reaction occurring within the last 10 years: No If all of the above answers are "NO", then may proceed with Cephalosporin use.   . Other Swelling    PT ALSO REACTS TO PAPER TAPE AND BAND-AIDS.  Marland Kitchen Codeine Itching    Mild takes benadryl  . Tape Rash    Patient Measurements: Height: '5\' 3"'$  (160 cm) Weight: 147 lb (66.679 kg) IBW/kg (Calculated) : 52.4  Vital Signs: Temp: 98.7 F (37.1 C) (01/13 2007) Temp Source: Temporal (01/13 2007) BP: 93/70 mmHg (01/13 2230) Pulse Rate: 83 (01/13 2230)  Labs:  Recent Labs  04/18/15 2031  WBC 12.5*  HGB 13.9  PLT 195  CREATININE 1.22*    Estimated Creatinine Clearance: 45.5 mL/min (by C-G formula based on Cr of 1.22).  No results for input(s): VANCOTROUGH, VANCOPEAK, VANCORANDOM, GENTTROUGH, GENTPEAK, GENTRANDOM, TOBRATROUGH, TOBRAPEAK, TOBRARND, AMIKACINPEAK, AMIKACINTROU, AMIKACIN in the last 72 hours.   Microbiology: Recent Results (from the past 720 hour(s))  Blood culture (routine x 2)     Status: None (Preliminary result)   Collection Time: 04/18/15  9:25 PM  Result Value Ref Range Status   Specimen Description BLOOD RIGHT ARM  Final   Special Requests BOTTLES DRAWN AEROBIC AND ANAEROBIC 4CC  Final   Culture PENDING  Incomplete   Report Status PENDING  Incomplete  Blood culture (routine x 2)     Status: None (Preliminary result)   Collection Time: 04/18/15 11:00 PM  Result Value Ref Range Status   Specimen Description  BLOOD RIGHT HAND  Final   Special Requests BOTTLES DRAWN AEROBIC ONLY 2CC  Final   Culture PENDING  Incomplete   Report Status PENDING  Incomplete    Medical History: Past Medical History  Diagnosis Date  . Essential hypertension   . Anxiety   . Depression   . COPD (chronic obstructive pulmonary disease) ([place])   . GERD (gastroesophageal reflux disease)   . Headache(784.0)   . Arthritis   . Hx of adenomatous colonic polyps   . Emphysema   . Cataract     1 lens implaqnt right eye,intact on left eye  . Goiter   . Fibromyalgia   . Pulmonary emboli ([place]) 11/04/11    Right upper lobe and r lower lobe PE  . Lung nodule seen on imaging study 11/04/11 CT    43m LLL  . DVT (deep venous thrombosis) ([place] 12/09/2011  . Pulmonary embolism ([place] 12/09/2011  . Hyperlipidemia   . History of radiation therapy 10/06/11    SRS 15Gy 172f brain  . UTI (lower urinary tract infection)   . Borderline diabetes mellitus   . Metastatic adenocarcinoma to brain ([facility]6/12/13    Left frontoparietal region  . Thyroid cancer ([facility]2015  . S/P radiation therapy 05/31/14 SRS    Brain  . PONV (postoperative nausea and vomiting)   . Controlled diabetes mellitus type II without complication ([place]74/[place]/9563  Previously diagnosed with borderline diabetes.  . Marland Kitchenerpes simplex esophagitis 03/11/2015    Also candidal esophagitis  . HCAP (healthcare-associated  pneumonia) 03/03/2015  . Mucositis due to radiation therapy 03/03/2015    Medications:   Assessment: 60 yo female seen in the ED for hypotension, SOB, fever; currently on OP treatment for pneumonia. Pt  Has elevated WBCs and SCr above normal baseline. Per family, Pt is also receiving PO chemotherapy for brain cancer. Empiric antibiotics ordered for HCAP/sepsis.  Goal of Therapy:  Vancomycin troughs 15-20 mcg/ml Eradicate infection  Plan:  Preliminary review of pertinent patient information completed.  Protocol will be initiated with one-time doses of  Aztreonam 2 Gm IV and Vancomycin 500 mg IV.  Forestine Na clinical pharmacist will complete review during morning rounds to assess patient and finalize treatment regimen.  Norberto Sorenson, [facility] 04/18/2015,11:41 PM .

## 2015-04-18 NOTE — ED Notes (Signed)
Dr Jeneen Rinks aware of bp, additional orders given

## 2015-04-18 NOTE — ED Notes (Signed)
Pt presents to er for further evaluation of low oxygen levels, low blood pressure, fever, increased pain level, family reports that pt is currently being treated for pneumonia, currently taking po chemo for brain cancer,

## 2015-04-18 NOTE — ED Notes (Signed)
CRITICAL VALUE ALERT  Critical value received:  Lactic Acid ISTAT 2.35  Date of notification: 04/18/15  Time of notification:  2035  Critical value read back: YES  Nurse who received alert:  Leeanne Deed RN   MD notified (1st page):  Jeneen Rinks  Time of first page:  2035  MD notified (2nd page):  Time of second page:  Responding MD:  Tanna Furry  Time MD responded:  2035

## 2015-04-18 NOTE — ED Notes (Signed)
Dr Vanita Panda notified that this patient was a potential sepsis patient.

## 2015-04-18 NOTE — ED Provider Notes (Signed)
CSN: [id number]     Arrival date & time 04/18/15  2002 History  By signing my name below, I, Emmanuella Mensah, attest that this documentation has been prepared under the direction and in the presence of Tanna Furry, MD. Electronically Signed: Judithann Sauger, ED Scribe. 04/18/2015. 9:07 PM.      Chief Complaint  Patient presents with  . Fever   The history is provided by the patient. No language interpreter was used.   HPI Comments: Haley Lowe is a 60 y.o. female with a hx of thyroid cancer and metastatic adenocarcinoma to brain who presents to the Emergency Department complaining of gradually worsening generalized weakness and drowsiness onset last night. Pt reports associated mild SOB, decreased appetite, and increase pain. Her family adds that pt has had a non-productive cough onset 4 days ago.Pt's last dose of Morphine was approx. 12 hours ago. Per family, pt had 49 rounds of chemotherapy in 02/2015 and was treated for shingles and pneumonia at that time but then re-admitted in December for Pneumonia. Pt was treated with Doxycycline on 03/29/15 twice a day for 10 days but CT scan still showed pneumonia 2 days ago so she was then placed back on the Doxycycline yesterday. She is currently on chemotherapy and she normally has 3L of home O2. Pt has an upcoming MRI in approx. 2 weeks and her last CT scan was here in 02/2015.   Past Medical History  Diagnosis Date  . Essential hypertension   . Anxiety   . Depression   . COPD (chronic obstructive pulmonary disease) ([place])   . GERD (gastroesophageal reflux disease)   . Headache(784.0)   . Arthritis   . Hx of adenomatous colonic polyps   . Emphysema   . Cataract     1 lens implaqnt right eye,intact on left eye  . Goiter   . Fibromyalgia   . Pulmonary emboli ([place]) 11/04/11    Right upper lobe and r lower lobe PE  . Lung nodule seen on imaging study 11/04/11 CT    79m LLL  . DVT (deep venous thrombosis) ([place] 12/09/2011  . Pulmonary embolism  ([place] 12/09/2011  . Hyperlipidemia   . History of radiation therapy 10/06/11    SRS 15Gy 176f brain  . UTI (lower urinary tract infection)   . Borderline diabetes mellitus   . Metastatic adenocarcinoma to brain ([facility]CM[place]6/12/13    Left frontoparietal region  . Thyroid cancer ([facility]2015  . S/P radiation therapy 05/31/14 SRS    Brain  . PONV (postoperative nausea and vomiting)   . Controlled diabetes mellitus type II without complication ([facility]CP[facility] [place]1254/56/2563  Previously diagnosed with borderline diabetes.  . Marland Kitchenerpes simplex esophagitis 03/11/2015    Also candidal esophagitis  . HCAP (healthcare-associated pneumonia) 03/03/2015  . Mucositis due to radiation therapy 03/03/2015   Past Surgical History  Procedure Laterality Date  . Abdominal hysterectomy  1996  . Cholecystectomy  2000  . Shoulder surgery Right 1998  . Eye surgery  2001  . Foot surgery Left 2005  . Craniotomy  09/15/2011    Procedure: CRANIOTOMY TUMOR EXCISION;  Surgeon: RoHosie SpangleMD;  [facility];  Service: Neurosurgery;  Laterality: N/A;  Craniotomy resection of tumor with stealth  . Cataract extraction  2011    With lens implant  . [facility]    . Video bronchoscopy with endobronchial ultrasound N/A 04/11/2014  Procedure: VIDEO BRONCHOSCOPY WITH ENDOBRONCHIAL ULTRASOUND;  Surgeon: Melrose Nakayama, MD;  Location: [place];  Service: Thoracic;  Laterality: N/A;  . Esophagogastroduodenoscopy (egd) with propofol N/A 03/07/2015    Procedure: ESOPHAGOGASTRODUODENOSCOPY (EGD) WITH PROPOFOL;  Surgeon: Daneil Dolin, MD;  [facility];  Service: Endoscopy;  Laterality: N/A;  . Esophageal biopsy  03/07/2015    Procedure: BIOPSY;  Surgeon: Daneil Dolin, MD;  [facility];  Service: Endoscopy;;   Family History  Problem Relation Age of Onset  . Asthma Mother   . Kidney failure Father   . Diabetes Sister   .  Heart attack Sister   . Colon cancer Brother   . COPD Sister   . Aneurysm Paternal Grandmother     Brain  . Parkinsonism Maternal Uncle   . COPD Brother    Social History  Substance Use Topics  . Smoking status: Former Smoker -- 1.00 packs/day for 40 years    Types: Cigarettes    Quit date: 04/06/2011  . Smokeless tobacco: Never Used  . Alcohol Use: No   OB History    No data available     Review of Systems  Constitutional: Positive for appetite change and fatigue. Negative for diaphoresis.  HENT: Negative for mouth sores and trouble swallowing.   Eyes: Negative for visual disturbance.  Respiratory: Positive for cough and shortness of breath. Negative for chest tightness and wheezing.   Gastrointestinal: Negative for abdominal pain and abdominal distention.  Endocrine: Negative for polydipsia, polyphagia and polyuria.  Genitourinary: Negative for frequency and hematuria.  Musculoskeletal: Negative for gait problem.  Skin: Negative for color change and pallor.  Neurological: Positive for weakness. Negative for dizziness, syncope and light-headedness.  Hematological: Does not bruise/bleed easily.  Psychiatric/Behavioral: Negative for behavioral problems.      Allergies  Penicillins; Other; Codeine; and Tape  Home Medications   Prior to Admission medications   Medication Sig Start Date End Date Taking? Authorizing Provider  acyclovir (ZOVIRAX) 200 MG capsule Take 2 capsules (400 mg total) by mouth 3 (three) times daily. Continue through 03/23/15 or until completed. 03/18/15   Rexene Alberts, MD  ALPRAZolam Duanne Moron) 0.5 MG tablet Take 0.5 mg by mouth 3 (three) times daily as needed for anxiety.  01/04/12   Historical Provider, MD  amitriptyline (ELAVIL) 25 MG tablet Take 25 mg by mouth at bedtime.    Historical Provider, MD  amLODipine (NORVASC) 10 MG tablet Take 10 mg by mouth daily.    Historical Provider, MD  cefUROXime (CEFTIN) 500 MG tablet Take 1 tablet (500 mg total)  by mouth 2 (two) times daily with a meal. Antibiotic be taken as directed, starting tomorrow on 03/19/15. Patient not taking: Reported on 04/14/2015 03/18/15   Rexene Alberts, MD  fluconazole (DIFLUCAN) 100 MG tablet Take 1 tablet (100 mg total) by mouth daily. Continue through 03/23/15 or until completion. Patient not taking: Reported on 04/14/2015 03/18/15   Rexene Alberts, MD  glimepiride (AMARYL) 1 MG tablet Take a half a tablet daily as needed for a blood sugar greater 150 or greater. Patient not taking: Reported on 04/14/2015 03/18/15   Rexene Alberts, MD  guaiFENesin-dextromethorphan [facility] DM) 100-10 MG/5ML syrup Take 5 mLs by mouth every 4 (four) hours as needed for cough. Patient not taking: Reported on 04/14/2015 03/18/15   Rexene Alberts, MD  levothyroxine (SYNTHROID, LEVOTHROID) 137 MCG tablet Take 137 mcg by mouth daily before breakfast.    Historical Provider, MD  magic mouthwash w/lidocaine  SOLN Take 5 mLs by mouth 4 (four) times daily as needed for mouth pain. 03/13/15   Velvet Bathe, MD  Magnesium 250 MG TABS Take 1 tablet by mouth daily.    Historical Provider, MD  morphine (MS CONTIN) 60 MG 12 hr tablet Take 1 tablet (60 mg total) by mouth every 12 (twelve) hours. Use this medication with caution as it can cause confusion and can affect her breathing. 03/18/15   Rexene Alberts, MD  ondansetron (ZOFRAN ODT) 8 MG disintegrating tablet Take 1 tablet (8 mg total) by mouth every 8 (eight) hours as needed for nausea or vomiting. 04/05/14   Pattricia Boss, MD  Oxycodone HCl 20 MG TABS Take 1 tablet by mouth every 4 (four) hours as needed (pain).    Historical Provider, MD  pantoprazole (PROTONIX) 40 MG tablet Take 1 tablet (40 mg total) by mouth 2 (two) times daily. 03/13/15   Velvet Bathe, MD  polyethylene glycol (MIRALAX / GLYCOLAX) packet Take 17 g by mouth every other day. For constipation    Historical Provider, MD  Simethicone (GAS-X EXTRA STRENGTH PO) Take 1 tablet by mouth daily as needed (gas  relief).    Historical Provider, MD  sucralfate (CARAFATE) 1 G tablet Take 1 tablet (1 g total) by mouth 4 (four) times daily -  with meals and at bedtime. 03/13/15   Velvet Bathe, MD  Wound Dressings (SONAFINE) Apply 1 application topically daily.    Historical Provider, MD   BP 94/72 mmHg  Pulse 76  Temp(Src) 98.7 F (37.1 C) (Temporal)  Resp 18  Ht '5\' 3"'$  (1.6 m)  Wt 147 lb (66.679 kg)  BMI 26.05 kg/m2  SpO2 93% Physical Exam  Constitutional: She is oriented to person, place, and time. She appears well-developed and well-nourished. No distress.  Sedate, awakens to voice. Answers simple questions  HENT:  Head: Normocephalic.  Eyes: Conjunctivae are normal. Pupils are equal, round, and reactive to light. No scleral icterus.  Slightly pale conjunctiva  Neck: Normal range of motion. Neck supple. No thyromegaly present.  Cardiovascular: Normal rate and regular rhythm.  Exam reveals no gallop and no friction rub.   No murmur heard. Pulmonary/Chest: Effort normal and breath sounds normal. No respiratory distress. She has no wheezes. She has no rales.  Crackles all through right lung and mostly at base of left lung  Abdominal: Soft. Bowel sounds are normal. She exhibits no distension. There is no tenderness. There is no rebound.  Musculoskeletal: Normal range of motion.  Neurological: She is alert and oriented to person, place, and time.  Skin: Skin is warm and dry. No rash noted.  Psychiatric: She has a normal mood and affect. Her behavior is normal.    ED Course  Procedures (including critical care time) DIAGNOSTIC STUDIES: Oxygen Saturation is 100% on RA, normal by my interpretation.    COORDINATION OF CARE: 9:05 PM- Pt advised of plan for treatment and pt agrees. Pt will receive extensive lab work and imaging for further evaluation.    Labs Review Labs Reviewed  COMPREHENSIVE METABOLIC PANEL - Abnormal; Notable for the following:    Glucose, Bld 109 (*)    Creatinine, Ser  1.22 (*)    GFR calc non Af Amer 48 (*)    GFR calc Af Amer 55 (*)    All other components within normal limits  URINALYSIS, ROUTINE W REFLEX MICROSCOPIC (NOT AT [facility]) - Abnormal; Notable for the following:    Specific Gravity, Urine <1.005 (*)  All other components within normal limits  CBC WITH DIFFERENTIAL/PLATELET - Abnormal; Notable for the following:    WBC 12.5 (*)    RDW 19.2 (*)    Neutro Abs 10.6 (*)    Lymphs Abs 0.6 (*)    Monocytes Absolute 1.1 (*)    All other components within normal limits  BLOOD GAS, ARTERIAL - Abnormal; Notable for the following:    pH, Arterial 7.294 (*)    pCO2 arterial 49.4 (*)    pO2, Arterial 75.8 (*)    Acid-base deficit 2.4 (*)    All other components within normal limits  CULTURE, BLOOD (ROUTINE X 2)  CULTURE, BLOOD (ROUTINE X 2)  URINE CULTURE  LACTIC ACID, PLASMA  LACTIC ACID, PLASMA  BRAIN NATRIURETIC PEPTIDE  I-STAT CG4 LACTIC ACID, ED  I-STAT CG4 LACTIC ACID, ED    Imaging Review Dg Chest Portable 1 View  04/18/2015  CLINICAL DATA:  Fever and shortness of breath for 4 days. Pneumonia. Currently undergoing chemotherapy for brain cancer. EXAM: PORTABLE CHEST 1 VIEW COMPARISON:  04/02/2015 FINDINGS: Mild worsening of bibasilar airspace disease is seen since prior study. Mild cardiomegaly stable. No evidence of pneumothorax or pleural effusion. IMPRESSION: Mild worsening of bibasilar airspace disease, suspicious for pneumonia. Electronically Signed   By: Earle Gell M.D.   On: 04/18/2015 21:08     Tanna Furry, MD has personally reviewed and evaluated these images and lab results as part of his medical decision-making.   EKG Interpretation None      MDM   Final diagnoses:  Healthcare-associated pneumonia    Patient requiring between 4 and 6 L of oxygen to maintain saturations. Initial lactic acid 1.6. Recheck 1.7. ABG shows pH 7.29 PCO2 49 PO2 75. Leukocytosis at 12.5. Is not neutropenic. Chest x-ray shows worsening of  bilateral basilar pneumonia.   Tanna Furry, MD 04/19/15 [phone]

## 2015-04-18 NOTE — ED Notes (Signed)
Dr Vanita Panda notified of pt being in department,

## 2015-04-19 DIAGNOSIS — G894 Chronic pain syndrome: Secondary | ICD-10-CM | POA: Diagnosis present

## 2015-04-19 DIAGNOSIS — J690 Pneumonitis due to inhalation of food and vomit: Secondary | ICD-10-CM | POA: Diagnosis not present

## 2015-04-19 DIAGNOSIS — Z515 Encounter for palliative care: Secondary | ICD-10-CM | POA: Diagnosis not present

## 2015-04-19 DIAGNOSIS — E785 Hyperlipidemia, unspecified: Secondary | ICD-10-CM | POA: Diagnosis present

## 2015-04-19 DIAGNOSIS — F419 Anxiety disorder, unspecified: Secondary | ICD-10-CM | POA: Diagnosis present

## 2015-04-19 DIAGNOSIS — J189 Pneumonia, unspecified organism: Secondary | ICD-10-CM

## 2015-04-19 DIAGNOSIS — Z90710 Acquired absence of both cervix and uterus: Secondary | ICD-10-CM | POA: Diagnosis not present

## 2015-04-19 DIAGNOSIS — Z66 Do not resuscitate: Secondary | ICD-10-CM | POA: Diagnosis present

## 2015-04-19 DIAGNOSIS — E860 Dehydration: Secondary | ICD-10-CM | POA: Diagnosis not present

## 2015-04-19 DIAGNOSIS — R05 Cough: Secondary | ICD-10-CM | POA: Diagnosis present

## 2015-04-19 DIAGNOSIS — K219 Gastro-esophageal reflux disease without esophagitis: Secondary | ICD-10-CM | POA: Diagnosis present

## 2015-04-19 DIAGNOSIS — Z9221 Personal history of antineoplastic chemotherapy: Secondary | ICD-10-CM | POA: Diagnosis not present

## 2015-04-19 DIAGNOSIS — I10 Essential (primary) hypertension: Secondary | ICD-10-CM | POA: Diagnosis present

## 2015-04-19 DIAGNOSIS — E890 Postprocedural hypothyroidism: Secondary | ICD-10-CM | POA: Diagnosis present

## 2015-04-19 DIAGNOSIS — Z87891 Personal history of nicotine dependence: Secondary | ICD-10-CM | POA: Diagnosis not present

## 2015-04-19 DIAGNOSIS — Z820 Family history of epilepsy and other diseases of the nervous system: Secondary | ICD-10-CM | POA: Diagnosis not present

## 2015-04-19 DIAGNOSIS — Z825 Family history of asthma and other chronic lower respiratory diseases: Secondary | ICD-10-CM | POA: Diagnosis not present

## 2015-04-19 DIAGNOSIS — Z800 Family history of malignant neoplasm of digestive organs: Secondary | ICD-10-CM | POA: Diagnosis not present

## 2015-04-19 DIAGNOSIS — Z85850 Personal history of malignant neoplasm of thyroid: Secondary | ICD-10-CM | POA: Diagnosis not present

## 2015-04-19 DIAGNOSIS — R1310 Dysphagia, unspecified: Secondary | ICD-10-CM | POA: Diagnosis not present

## 2015-04-19 DIAGNOSIS — Z8249 Family history of ischemic heart disease and other diseases of the circulatory system: Secondary | ICD-10-CM | POA: Diagnosis not present

## 2015-04-19 DIAGNOSIS — J449 Chronic obstructive pulmonary disease, unspecified: Secondary | ICD-10-CM | POA: Diagnosis present

## 2015-04-19 DIAGNOSIS — Z86711 Personal history of pulmonary embolism: Secondary | ICD-10-CM | POA: Diagnosis not present

## 2015-04-19 DIAGNOSIS — Z79891 Long term (current) use of opiate analgesic: Secondary | ICD-10-CM | POA: Diagnosis not present

## 2015-04-19 DIAGNOSIS — C7931 Secondary malignant neoplasm of brain: Secondary | ICD-10-CM

## 2015-04-19 DIAGNOSIS — J438 Other emphysema: Secondary | ICD-10-CM | POA: Diagnosis not present

## 2015-04-19 DIAGNOSIS — M1990 Unspecified osteoarthritis, unspecified site: Secondary | ICD-10-CM | POA: Diagnosis present

## 2015-04-19 DIAGNOSIS — E876 Hypokalemia: Secondary | ICD-10-CM | POA: Diagnosis not present

## 2015-04-19 DIAGNOSIS — E119 Type 2 diabetes mellitus without complications: Secondary | ICD-10-CM | POA: Diagnosis present

## 2015-04-19 DIAGNOSIS — M797 Fibromyalgia: Secondary | ICD-10-CM | POA: Diagnosis present

## 2015-04-19 DIAGNOSIS — Z9049 Acquired absence of other specified parts of digestive tract: Secondary | ICD-10-CM | POA: Diagnosis not present

## 2015-04-19 DIAGNOSIS — Z833 Family history of diabetes mellitus: Secondary | ICD-10-CM | POA: Diagnosis not present

## 2015-04-19 DIAGNOSIS — J9601 Acute respiratory failure with hypoxia: Secondary | ICD-10-CM | POA: Diagnosis not present

## 2015-04-19 DIAGNOSIS — Z9981 Dependence on supplemental oxygen: Secondary | ICD-10-CM | POA: Diagnosis not present

## 2015-04-19 DIAGNOSIS — Z923 Personal history of irradiation: Secondary | ICD-10-CM | POA: Diagnosis not present

## 2015-04-19 DIAGNOSIS — D696 Thrombocytopenia, unspecified: Secondary | ICD-10-CM | POA: Diagnosis present

## 2015-04-19 LAB — BLOOD GAS, ARTERIAL
ACID-BASE DEFICIT: 0.6 mmol/L (ref 0.0–2.0)
Bicarbonate: 23.5 mEq/L (ref 20.0–24.0)
Drawn by: 234301
O2 CONTENT: 3 L/min
O2 SAT: 86.4 %
PCO2 ART: 43 mmHg (ref 35.0–45.0)
PO2 ART: 53.9 mmHg — AB (ref 80.0–100.0)
pH, Arterial: 7.366 (ref 7.350–7.450)

## 2015-04-19 LAB — MRSA PCR SCREENING: MRSA by PCR: NEGATIVE

## 2015-04-19 LAB — LACTIC ACID, PLASMA: Lactic Acid, Venous: 2.1 mmol/L (ref 0.5–2.0)

## 2015-04-19 LAB — STREP PNEUMONIAE URINARY ANTIGEN: STREP PNEUMO URINARY ANTIGEN: NEGATIVE

## 2015-04-19 LAB — BRAIN NATRIURETIC PEPTIDE: B Natriuretic Peptide: 42 pg/mL (ref 0.0–100.0)

## 2015-04-19 MED ORDER — ALPRAZOLAM 0.5 MG PO TABS
0.5000 mg | ORAL_TABLET | Freq: Three times a day (TID) | ORAL | Status: DC | PRN
  Administered 2015-04-19 – 2015-04-24 (×9): 0.5 mg via ORAL
  Filled 2015-04-19 (×9): qty 1

## 2015-04-19 MED ORDER — VALACYCLOVIR HCL 500 MG PO TABS
1000.0000 mg | ORAL_TABLET | Freq: Every day | ORAL | Status: DC
  Administered 2015-04-20 – 2015-04-25 (×5): 1000 mg via ORAL
  Filled 2015-04-19 (×10): qty 2

## 2015-04-19 MED ORDER — MORPHINE SULFATE 15 MG PO TABS: 15.0000 mg | ORAL_TABLET | ORAL | Status: DC | PRN

## 2015-04-19 MED ORDER — ACYCLOVIR 200 MG PO CAPS
400.0000 mg | ORAL_CAPSULE | Freq: Three times a day (TID) | ORAL | Status: DC
  Administered 2015-04-19: 400 mg via ORAL
  Filled 2015-04-19: qty 2

## 2015-04-19 MED ORDER — SUCRALFATE 1 G PO TABS
1.0000 g | ORAL_TABLET | Freq: Three times a day (TID) | ORAL | Status: DC
  Administered 2015-04-19 – 2015-04-25 (×11): 1 g via ORAL
  Filled 2015-04-19 (×14): qty 1

## 2015-04-19 MED ORDER — SODIUM CHLORIDE 0.9 % IV SOLN
INTRAVENOUS | Status: DC
  Administered 2015-04-19: 03:00:00 via INTRAVENOUS
  Administered 2015-04-20: 1000 mL via INTRAVENOUS
  Administered 2015-04-21 – 2015-04-23 (×4): via INTRAVENOUS
  Administered 2015-04-23: 1000 mL via INTRAVENOUS
  Administered 2015-04-24 – 2015-04-25 (×2): via INTRAVENOUS

## 2015-04-19 MED ORDER — LEVOTHYROXINE SODIUM 25 MCG PO TABS
137.0000 ug | ORAL_TABLET | Freq: Every day | ORAL | Status: DC
  Administered 2015-04-19 – 2015-04-25 (×7): 137 ug via ORAL
  Filled 2015-04-19 (×7): qty 1

## 2015-04-19 MED ORDER — ENSURE ENLIVE PO LIQD
237.0000 mL | Freq: Two times a day (BID) | ORAL | Status: DC
  Administered 2015-04-23 – 2015-04-25 (×3): 237 mL via ORAL

## 2015-04-19 MED ORDER — VANCOMYCIN HCL 500 MG IV SOLR
INTRAVENOUS | Status: AC
  Filled 2015-04-19: qty 500

## 2015-04-19 MED ORDER — MAGIC MOUTHWASH
5.0000 mL | Freq: Four times a day (QID) | ORAL | Status: DC | PRN
  Administered 2015-04-19: 5 mL via ORAL
  Filled 2015-04-19: qty 5

## 2015-04-19 MED ORDER — AMLODIPINE BESYLATE 5 MG PO TABS
10.0000 mg | ORAL_TABLET | Freq: Every day | ORAL | Status: DC
  Administered 2015-04-19 – 2015-04-25 (×7): 10 mg via ORAL
  Filled 2015-04-19 (×7): qty 2

## 2015-04-19 MED ORDER — VANCOMYCIN HCL IN DEXTROSE 1-5 GM/200ML-% IV SOLN: 1000.0000 mg | INTRAVENOUS | Status: DC

## 2015-04-19 MED ORDER — MORPHINE SULFATE 15 MG PO TABS
30.0000 mg | ORAL_TABLET | ORAL | Status: DC | PRN
  Administered 2015-04-19 (×2): 30 mg via ORAL
  Filled 2015-04-19 (×2): qty 2

## 2015-04-19 MED ORDER — LIDOCAINE VISCOUS 2 % MT SOLN
5.0000 mL | Freq: Four times a day (QID) | OROMUCOSAL | Status: DC | PRN
Start: 2015-04-19 — End: 2015-04-25
  Administered 2015-04-19: 5 mL via OROMUCOSAL
  Filled 2015-04-19: qty 15

## 2015-04-19 MED ORDER — AMITRIPTYLINE HCL 25 MG PO TABS
25.0000 mg | ORAL_TABLET | Freq: Every day | ORAL | Status: DC
  Administered 2015-04-19 – 2015-04-24 (×5): 25 mg via ORAL
  Filled 2015-04-19 (×5): qty 1

## 2015-04-19 MED ORDER — PANTOPRAZOLE SODIUM 40 MG PO TBEC
40.0000 mg | DELAYED_RELEASE_TABLET | Freq: Two times a day (BID) | ORAL | Status: DC
  Administered 2015-04-19 (×2): 40 mg via ORAL
  Filled 2015-04-19 (×2): qty 1

## 2015-04-19 MED ORDER — MAGIC MOUTHWASH W/LIDOCAINE
5.0000 mL | Freq: Four times a day (QID) | ORAL | Status: DC | PRN
  Filled 2015-04-19: qty 5

## 2015-04-19 MED ORDER — BISACODYL 10 MG RE SUPP: 10.0000 mg | Freq: Every day | RECTAL | Status: DC | PRN

## 2015-04-19 MED ORDER — FLUCONAZOLE 100 MG PO TABS
100.0000 mg | ORAL_TABLET | Freq: Every day | ORAL | Status: DC
  Administered 2015-04-19: 100 mg via ORAL
  Filled 2015-04-19: qty 1

## 2015-04-19 MED ORDER — OXYCODONE HCL 5 MG PO TABS
20.0000 mg | ORAL_TABLET | ORAL | Status: DC | PRN
  Administered 2015-04-19: 20 mg via ORAL
  Filled 2015-04-19: qty 4

## 2015-04-19 MED ORDER — POLYETHYLENE GLYCOL 3350 17 G PO PACK
17.0000 g | PACK | ORAL | Status: DC
Start: 2015-04-19 — End: 2015-04-25
  Administered 2015-04-19: 17 g via ORAL
  Filled 2015-04-19 (×2): qty 1

## 2015-04-19 MED ORDER — DEXTROSE 5 % IV SOLN
1.0000 g | Freq: Three times a day (TID) | INTRAVENOUS | Status: DC
  Administered 2015-04-19 – 2015-04-22 (×10): 1 g via INTRAVENOUS
  Filled 2015-04-19 (×13): qty 1

## 2015-04-19 NOTE — ED Notes (Signed)
Dr. Jeneen Rinks made aware that patient will have periods of increased alertness followed by periods of lethargy. During periods of lethargy patient requires NRB @ 15lpm for O2 sats down to 90% on 6lpm Nasal [place]ula as ordered by Dr. Jeneen Rinks, when she is more alert, she has oxygen saturations of 96%-98% on 6lpm Nasal [place]ula as ordered by Dr. Jeneen Rinks. Pt also has very weak, non-productive cough. I notified Dr. Jeneen Rinks that patient is unable to effectively expectorate sputum when she coughs. No new orders. RT at bedside for ABG and aware of patients condition.

## 2015-04-19 NOTE — H&P (Signed)
PCP:   [name], MD   Chief Complaint:  Fever, cough  HPI:  60 year old female with history of thyroid cancer, metastatic adenocarcinoma to brain, who is currently undergoing chemotherapy at [facility]. Today presents to the hospital with gradually worsening weakness and drowsiness since last night. Patient is very somnolent at this time and history obtained from patient's husband at bedside. As per patient's husband patient reported mild shortness of breath and worsening pain she has had a nonproductive cough for past 4 days. Patient is high dose morphine 60 mg twice a day and last dose was around 8 AM in the morning. Patient had chemotherapy in November 2016 at that time she was treated for shingles and pneumonia. Pt was treated with Doxycycline on 03/29/15 twice a day for 10 days but CT scan still showed pneumonia 2 days ago so she was then placed back on the Doxycycline yesterday by her PCP. Patient is on home oxygen, she received radiation treatment for the brain metastasis and has upcoming MRI in 2 weeks. CT head done today showed no significant abnormality.  In the ED chest x-ray showed bilateral basilar pneumonia. Patient started on vancomycin and Azactam per pharmacy  Allergies:   Allergies  Allergen Reactions  . Penicillins Swelling    " my brain swelled, and mymouth" Has patient had a PCN reaction causing immediate rash, facial/tongue/throat swelling, SOB or lightheadedness with hypotension: No Has patient had a PCN reaction causing severe rash involving mucus membranes or skin necrosis: No Has patient had a PCN reaction that required hospitalization No Has patient had a PCN reaction occurring within the last 10 years: No If all of the above answers are "NO", then may proceed with Cephalosporin use.   . Other Swelling    PT ALSO REACTS TO PAPER TAPE AND BAND-AIDS.  Marland Kitchen Codeine Itching    Mild takes benadryl  . Tape Rash      Past Medical History  Diagnosis  Date  . Essential hypertension   . Anxiety   . Depression   . COPD (chronic obstructive pulmonary disease) ([place])   . GERD (gastroesophageal reflux disease)   . Headache(784.0)   . Arthritis   . Hx of adenomatous colonic polyps   . Emphysema   . Cataract     1 lens implaqnt right eye,intact on left eye  . Goiter   . Fibromyalgia   . Pulmonary emboli ([place]) 11/04/11    Right upper lobe and r lower lobe PE  . Lung nodule seen on imaging study 11/04/11 CT    49m LLL  . DVT (deep venous thrombosis) ([facility] 12/09/2011  . [place]monary embolism ([facility] 12/09/2011  . Hyperlipidemia   . History of radiation therapy 10/06/11    SRS 15Gy 175f brain  . UTI (lower urinary tract infection)   . Borderline diabetes mellitus   . [place]    Left frontoparietal region  . [place]roid cancer ([facility]  . S/P radiation therapy 05/31/14 SRS    Brain  . PONV (postoperative nausea and vomiting)   . Controlled diabetes mellitus type II without complication ([facility]/[place]2683  Previously diagnosed with borderline diabetes.  . Marland Kitchenerpes simplex esophagitis 03/11/2015    Also candidal esophagitis  . HCAP (healthcare-associated pneumonia) 03/03/2015  . Mucositis due to radiation therapy 03/03/2015    Past Surgical History  Procedure Laterality Date  . Abdominal hysterectomy  1996  . Cholecystectomy  2000  . Shoulder surgery Right 1998  .  Eye surgery  2001  . Foot surgery Left 2005  . Craniotomy  09/15/2011    Procedure: CRANIOTOMY TUMOR EXCISION;  Surgeon: Hosie Spangle, MD;  Location: [place] NEURO ORS;  Service: Neurosurgery;  Laterality: N/A;  Craniotomy resection of tumor with stealth  . Cataract extraction  2011    With lens implant  . Total thyroidectomy  10-29-2013    [facility]  . Lymph gland excision    . Video bronchoscopy with endobronchial ultrasound N/A 04/11/2014    Procedure: VIDEO BRONCHOSCOPY WITH ENDOBRONCHIAL ULTRASOUND;  Surgeon: Melrose Nakayama, MD;  Location: [place];  Service: Thoracic;  Laterality: N/A;  . Esophagogastroduodenoscopy (egd) with propofol N/A 03/07/2015    Procedure: ESOPHAGOGASTRODUODENOSCOPY (EGD) WITH PROPOFOL;  Surgeon: Daneil Dolin, MD;  [facility];  Service: Endoscopy;  Laterality: N/A;  . Esophageal biopsy  03/07/2015    Procedure: BIOPSY;  Surgeon: Daneil Dolin, MD;  [facility];  Service: Endoscopy;;    Prior to Admission medications   Medication Sig Start Date End Date Taking? Authorizing Provider  acyclovir (ZOVIRAX) 200 MG capsule Take 2 capsules (400 mg total) by mouth 3 (three) times daily. Continue through 03/23/15 or until completed. 03/18/15   Rexene Alberts, MD  ALPRAZolam Duanne Moron) 0.5 MG tablet Take 0.5 mg by mouth 3 (three) times daily as needed for anxiety.  01/04/12   Historical Provider, MD  amitriptyline (ELAVIL) 25 MG tablet Take 25 mg by mouth at bedtime.    Historical Provider, MD  amLODipine (NORVASC) 10 MG tablet Take 10 mg by mouth daily.    Historical Provider, MD  cefUROXime (CEFTIN) 500 MG tablet Take 1 tablet (500 mg total) by mouth 2 (two) times daily with a meal. Antibiotic be taken as directed, starting tomorrow on 03/19/15. Patient not taking: Reported on 04/14/2015 03/18/15   Rexene Alberts, MD  fluconazole (DIFLUCAN) 100 MG tablet Take 1 tablet (100 mg total) by mouth daily. Continue through 03/23/15 or until completion. Patient not taking: Reported on 04/14/2015 03/18/15   Rexene Alberts, MD  glimepiride (AMARYL) 1 MG tablet Take a half a tablet daily as needed for a blood sugar greater 150 or greater. Patient not taking: Reported on 04/14/2015 03/18/15   Rexene Alberts, MD  guaiFENesin-dextromethorphan [facility] DM) 100-10 MG/5ML syrup Take 5 mLs by mouth every 4 (four) hours as needed for cough. Patient not taking: Reported on 04/14/2015 03/18/15   Rexene Alberts, MD  levothyroxine (SYNTHROID, LEVOTHROID) 137 MCG tablet Take 137 mcg by mouth daily  before breakfast.    Historical Provider, MD  magic mouthwash w/lidocaine SOLN Take 5 mLs by mouth 4 (four) times daily as needed for mouth pain. 03/13/15   Velvet Bathe, MD  Magnesium 250 MG TABS Take 1 tablet by mouth daily.    Historical Provider, MD  morphine (MS CONTIN) 60 MG 12 hr tablet Take 1 tablet (60 mg total) by mouth every 12 (twelve) hours. Use this medication with caution as it can cause confusion and can affect her breathing. 03/18/15   Rexene Alberts, MD  ondansetron (ZOFRAN ODT) 8 MG disintegrating tablet Take 1 tablet (8 mg total) by mouth every 8 (eight) hours as needed for nausea or vomiting. 04/05/14   Pattricia Boss, MD  Oxycodone HCl 20 MG TABS Take 1 tablet by mouth every 4 (four) hours as needed (pain).    Historical Provider, MD  pantoprazole (PROTONIX) 40 MG tablet Take 1 tablet (40 mg total) by mouth  2 (two) times daily. 03/13/15   Velvet Bathe, MD  polyethylene glycol (MIRALAX / GLYCOLAX) packet Take 17 g by mouth every other day. For constipation    Historical Provider, MD  Simethicone (GAS-X EXTRA STRENGTH PO) Take 1 tablet by mouth daily as needed (gas relief).    Historical Provider, MD  sucralfate (CARAFATE) 1 G tablet Take 1 tablet (1 g total) by mouth 4 (four) times daily -  with meals and at bedtime. 03/13/15   Velvet Bathe, MD  Wound Dressings (SONAFINE) Apply 1 application topically daily.    Historical Provider, MD    Social History:  reports that she quit smoking about 4 years ago. Her smoking use included Cigarettes. She has a 40 pack-year smoking history. She has never used smokeless tobacco. She reports that she does not drink alcohol or use illicit drugs.  Family History  Problem Relation Age of Onset  . Asthma Mother   . Kidney failure Father   . Diabetes Sister   . Heart attack Sister   . Colon cancer Brother   . COPD Sister   . Aneurysm Paternal Grandmother     Brain  . Parkinsonism Maternal Uncle   . COPD Brother     Danley Danker Weights   04/18/15  2007 04/19/15 0201  Weight: 66.679 kg (147 lb) 68.4 kg (150 lb 12.7 oz)    All the positives are listed in BOLD  Review of Systems:  HEENT: Headache, blurred vision, runny nose, sore throat Neck: Hypothyroidism, hyperthyroidism,,lymphadenopathy Chest : Shortness of breath, history of COPD, Asthma Heart : Chest pain, history of coronary arterey disease GI:  Nausea, vomiting, diarrhea, constipation, GERD GU: Dysuria, urgency, frequency of urination, hematuria Neuro: Stroke, seizures, syncope Psych: Depression, anxiety, hallucinations   Physical Exam: Blood pressure 126/77, pulse 94, temperature 97.8 F (36.6 C), temperature source Oral, resp. rate 20, height '5\' 4"'$  (1.626 m), weight 68.4 kg (150 lb 12.7 oz), SpO2 98 %. Constitutional:   Patient is a well-developed and well-nourished female , lethargic at this time. Head: Normocephalic and atraumatic Mouth: Mucus membranes moist Cardiovascular: RRR, S1 normal, S2 normal Pulmonary/Chest: Bilateral rhonchi Abdominal: Soft. Non-tender, non-distended, bowel sounds are normal, no masses, organomegaly, or guarding present.  Neurological: A&O x3, Strength is normal and symmetric bilaterally, cranial nerve II-XII are grossly intact, no focal motor deficit, sensory intact to light touch bilaterally.  Extremities : No Cyanosis, Clubbing or Edema  Labs on Admission:  Basic Metabolic Panel:  Recent Labs Lab 04/18/15 2031  NA 139  K 3.9  CL 103  CO2 25  GLUCOSE 109*  BUN 13  CREATININE 1.22*  CALCIUM 8.9   Liver Function Tests:  Recent Labs Lab 04/18/15 2031  AST 40  ALT 48  ALKPHOS 94  BILITOT 0.8  PROT 6.7  ALBUMIN 3.7   CBC:  Recent Labs Lab 04/18/15 2031  WBC 12.5*  NEUTROABS 10.6*  HGB 13.9  HCT 44.3  MCV 94.5  PLT 195    BNP (last 3 results)  Recent Labs  03/15/15 2100 04/18/15 2031  BNP 81.0 42.0     Radiological Exams on Admission: Ct Head W Wo Contrast  04/19/2015  CLINICAL DATA:   IMPRESSION:  No acute intracranial process. No enhancing intracranial masses though, MRI of the brain with contrast would be more sensitive for assessment of residual or new metastasis. 7 mm hypodensity LEFT cerebellum likely representing interval response to treatment of known metastasis. LEFT frontal craniotomy and subjacent encephalomalacia. Electronically Signed   By:  Elon Alas M.D.   On: 04/19/2015 00:42   Dg Chest Portable 1 View  04/18/2015  CLINICAL DATA:  Fever and shortness of breath for 4 days. Pneumonia. Currently undergoing chemotherapy for brain cancer. EXAM: PORTABLE CHEST 1 VIEW COMPARISON:  04/02/2015 FINDINGS: Mild worsening of bibasilar airspace disease is seen since prior study. Mild cardiomegaly stable. No evidence of pneumothorax or pleural effusion. IMPRESSION: Mild worsening of bibasilar airspace disease, suspicious for pneumonia. Electronically Signed   By: Earle Gell M.D.   On: 04/18/2015 21:08    EKG: Independently reviewed. Normal sinus rhythm   Assessment/Plan Active Problems:   Metastatic adenocarcinoma to brain, thyroid cancer   HCAP (healthcare-associated pneumonia)   Pneumonia   Dehydration  Healthcare associated pneumonia Will admit the patient and start vancomycin and Azactam per pharmacy consultation. Check urinary strep pneumonia and region, Legionella urine antigen Follow blood cultures  Metastatic adenocarcinoma to brain, third cancer Patient undergoing chemotherapy at [facility] MRI brain schedule in 2 weeks, CT head today negative for metastasis  Chronic pain syndrome Patient is on high-dose morphine 60 mg every 12 hours, oxycodone when necessary Will hold morphine at this time as patient is very somnolent  DVT prophylaxis SCDs  Code status: Full code  Family discussion: Admission, patients condition and plan of care including tests being ordered have been discussed with the patient's husband at bedside who indicate understanding and agree with  the plan and Code Status.   Time Spent on Admission: 55 min  [place] [facility] Pager: [phone] 04/19/2015, 2:10 AM  If 7PM-7AM, please contact night-coverage  [email or web address]  Password [email or web address]

## 2015-04-19 NOTE — Progress Notes (Signed)
ANTIBIOTIC CONSULT NOTE-Preliminary  Pharmacy Consult for Vancomycin and Aztreonam Indication: Pneumonia  Allergies  Allergen Reactions  . Penicillins Swelling    " my brain swelled, and mymouth" Has patient had a PCN reaction causing immediate rash, facial/tongue/throat swelling, SOB or lightheadedness with hypotension: No Has patient had a PCN reaction causing severe rash involving mucus membranes or skin necrosis: No Has patient had a PCN reaction that required hospitalization No Has patient had a PCN reaction occurring within the last 10 years: No If all of the above answers are "NO", then may proceed with Cephalosporin use.   . Other Swelling    PT ALSO REACTS TO PAPER TAPE AND BAND-AIDS.  Marland Kitchen Codeine Itching    Mild takes benadryl  . Tape Rash   Patient Measurements: Height: '5\' 4"'$  (162.6 cm) Weight: 150 lb 8 oz (68.266 kg) IBW/kg (Calculated) : 54.7  Vital Signs: Temp: 99.8 F (37.7 C) (01/14 0833) Temp Source: Oral (01/14 0833) BP: 93/68 mmHg (01/14 0500) Pulse Rate: 94 (01/14 0207)  Labs:  Recent Labs  04/18/15 2031  WBC 12.5*  HGB 13.9  PLT 195  CREATININE 1.22*   Estimated Creatinine Clearance: 47.1 mL/min (by C-G formula based on Cr of 1.22).  No results for input(s): VANCOTROUGH, VANCOPEAK, VANCORANDOM, GENTTROUGH, GENTPEAK, GENTRANDOM, TOBRATROUGH, TOBRAPEAK, TOBRARND, AMIKACINPEAK, AMIKACINTROU, AMIKACIN in the last 72 hours.   Microbiology: Recent Results (from the past 720 hour(s))  Blood culture (routine x 2)     Status: None (Preliminary result)   Collection Time: 04/18/15  9:25 PM  Result Value Ref Range Status   Specimen Description BLOOD RIGHT ARM  Final   Special Requests BOTTLES DRAWN AEROBIC AND ANAEROBIC 4CC  Final   Culture NO GROWTH < 12 HOURS  Final   Report Status PENDING  Incomplete  Blood culture (routine x 2)     Status: None (Preliminary result)   Collection Time: 04/18/15 11:00 PM  Result Value Ref Range Status   Specimen  Description BLOOD RIGHT HAND  Final   Special Requests BOTTLES DRAWN AEROBIC ONLY 2CC  Final   Culture NO GROWTH < 12 HOURS  Final   Report Status PENDING  Incomplete  MRSA PCR Screening     Status: None   Collection Time: 04/19/15  1:50 AM  Result Value Ref Range Status   MRSA by PCR NEGATIVE NEGATIVE Final    Comment:        The GeneXpert MRSA Assay (FDA approved for NASAL specimens only), is one component of a comprehensive MRSA colonization surveillance program. It is not intended to diagnose MRSA infection nor to guide or monitor treatment for MRSA infections.    Medical History: Past Medical History  Diagnosis Date  . Essential hypertension   . Anxiety   . Depression   . COPD (chronic obstructive pulmonary disease) ([place])   . GERD (gastroesophageal reflux disease)   . Headache(784.0)   . Arthritis   . Hx of adenomatous colonic polyps   . Emphysema   . Cataract     1 lens implaqnt right eye,intact on left eye  . Goiter   . Fibromyalgia   . Pulmonary emboli ([place]) 11/04/11    Right upper lobe and r lower lobe PE  . Lung nodule seen on imaging study 11/04/11 CT    59m LLL  . DVT (deep venous thrombosis) ([facility] 12/09/2011  . [place]monary [place]ism ([facility] 12/09/2011  . Hyperlipidemia   . History of radiation therapy 10/06/11    SRS 15Gy 125f brain  .  UTI (lower urinary tract infection)   . Borderline diabetes mellitus   . Metastatic adenocarcinoma to brain ([place]) 09/15/11    Left frontoparietal region  . Thyroid cancer ([place]) 2015  . S/P radiation therapy 05/31/14 SRS    Brain  . PONV (postoperative nausea and vomiting)   . Controlled diabetes mellitus type II without complication ([place]) 24/40/1027    Previously diagnosed with borderline diabetes.  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 HCAP (healthcare-associated pneumonia) 03/03/2015  . Mucositis due to radiation therapy 03/03/2015   Anti-infectives    Start     Dose/Rate Route Frequency  Ordered Stop   04/19/15 1000  acyclovir (ZOVIRAX) 200 MG capsule 400 mg     400 mg Oral 3 times daily 04/19/15 0227     04/19/15 1000  fluconazole (DIFLUCAN) tablet 100 mg     100 mg Oral Daily 04/19/15 0227     04/19/15 1000  aztreonam (AZACTAM) 1 g in dextrose 5 % 50 mL IVPB     1 g 100 mL/hr over 30 Minutes Intravenous Every 8 hours 04/19/15 0833     04/19/15 0900  vancomycin (VANCOCIN) IVPB 1000 mg/200 mL premix     1,000 mg 200 mL/hr over 60 Minutes Intravenous Every 18 hours 04/19/15 0831     04/18/15 2345  vancomycin (VANCOCIN) 500 mg in sodium chloride 0.9 % 100 mL IVPB     500 mg 100 mL/hr over 60 Minutes Intravenous  Once 04/18/15 2330 04/19/15 0222   04/18/15 2330  aztreonam (AZACTAM) 2 g in dextrose 5 % 50 mL IVPB     2 g 100 mL/hr over 30 Minutes Intravenous  Once 04/18/15 2323 04/19/15 0033     Assessment: 60 yo female seen in the ED for hypotension, SOB, fever; currently on OP treatment for pneumonia. Pt  Has elevated WBCs and SCr above normal baseline. Per family, Pt is also receiving PO chemotherapy for brain cancer. Empiric antibiotics ordered for HCAP/sepsis. Lactate elevated.  Goal of Therapy:  Vancomycin troughs 15-20 mcg/ml Eradicate infection  Plan: Vancomycin '1000mg'$  IV q18hrs Aztreonam 1gm IV q8hrs Check Vancomycin trough level at steady state Monitor labs, renal fxn, c/s  Ripberger Robinsons A, RPH 04/19/2015,9:31 AM .

## 2015-04-19 NOTE — Progress Notes (Signed)
Initial Nutrition Assessment  DOCUMENTATION CODES:  Not applicable  INTERVENTION: Pt was seen by SLP last admission on 12/13 at which time Dysphagia level 3 w/ Thin liquids was recommended. May consider downgrading diet or re consulting SLP  Ensure Enlive po BID, each supplement provides 350 kcal and 20 grams of protein  NUTRITION DIAGNOSIS:  Increased nutrient needs related to cancer and cancer related treatments as evidenced by estimated nutrition requirements for this condition  GOAL:  Patient will meet greater than or equal to 90% of their needs  MONITOR:  PO intake, Supplement acceptance, Labs, I & O's  REASON FOR ASSESSMENT:  Malnutrition Screening Tool    ASSESSMENT:  60 y/o female PMHx HTN, GERD, COPD, DM2, HLD, Thyroid cancer, aniexty/depression and most significant , metastatic adenocarcinoma to brain, who is currently undergoing chemotherapy at [facility]. Pt presents with worsening weakness/drowsiness. Admitted and treated for HCAP/Dehydration  RD operating remotely.  Per ED MD, pt has had a poor appetite recently. There is quantitative information regarding PO intake.  There was no discussion on recent wt loss. Per EMR documentation, pt appears to have been able to maintain her weight for the past couple months. She has lost 30 lbs (17% bw) in 1 year  Of note, pt has history of dysphagia and was working with ST on last admission. She was placed on Dys 3 after MBS.   Pt was seen 1 month ago by RD. She was not identified as being malnourished that assessment. However she was diagnosed with severe malnutrition during an admission shortly before that. It was in context of acute illness  NFPE: Unable to assess, no wasting noted on last NFPE assess   Diet Order:  Diet Heart Room service appropriate?: Yes; Fluid consistency:: Thin  Skin:  Pale/dry  Last BM:  Unknown-distended  Height:  Ht Readings from Last 1 Encounters:  04/19/15 '5\' 4"'$  (1.626 m)   Weight:  Wt  Readings from Last 1 Encounters:  04/19/15 150 lb 8 oz (68.266 kg)   Wt Readings from Last 10 Encounters:  04/19/15 150 lb 8 oz (68.266 kg)  04/14/15 145 lb (65.772 kg)  03/16/15 147 lb 0.8 oz (66.7 kg)  03/03/15 146 lb (66.225 kg)  03/02/15 154 lb (69.854 kg)  02/25/15 151 lb (68.493 kg)  02/21/15 155 lb 1.6 oz (70.353 kg)  02/14/15 149 lb 9.6 oz (67.858 kg)  02/07/15 152 lb 3.2 oz (69.037 kg)  01/27/15 158 lb 6.4 oz (71.85 kg)  Admit weight: 147 lbs.   Ideal Body Weight:  54.54 kg  BMI:  Body mass index is 25.82 kg/(m^2).  Estimated Nutritional Needs:  Kcal:  1800-2000 kcals (27-30 kcal/kg bw) Protein:  80-94 g (1.2-1.4 g/kg bw) Fluid:  2 liters fluid  EDUCATION NEEDS:  No education needs identified at this time  Burtis Junes RD, LDN Nutrition Pager: [phone] 04/19/2015 9:26 AM

## 2015-04-19 NOTE — Progress Notes (Addendum)
Patient admitted to the hospital earlier this morning by Dr. Darrick Meigs  Patient seen and examined. She appears to be more awake today. She has crackles at her bases bilaterally. No lower extremity edema.  She's been admitted to the hospital with recurrent pneumonia. Started on broad-spectrum antibiotics. MRSA PCR is negative so vancomycin will be discontinued. We will continue on aztreonam for now. Check sputum culture. Blood pressure appears to be stable. Continue IV fluids.   Haley Lowe

## 2015-04-19 NOTE — Progress Notes (Signed)
Patientt is not able to void. Bladder scan showed 944 cc. Dr. Darrick Meigs was notified and order to put foley catheter was obtained.

## 2015-04-20 DIAGNOSIS — J690 Pneumonitis due to inhalation of food and vomit: Principal | ICD-10-CM

## 2015-04-20 DIAGNOSIS — J9601 Acute respiratory failure with hypoxia: Secondary | ICD-10-CM

## 2015-04-20 LAB — BASIC METABOLIC PANEL
ANION GAP: 5 (ref 5–15)
BUN: <5 mg/dL — ABNORMAL LOW (ref 6–20)
CALCIUM: 7.8 mg/dL — AB (ref 8.9–10.3)
CHLORIDE: 109 mmol/L (ref 101–111)
CO2: 26 mmol/L (ref 22–32)
Creatinine, Ser: 0.31 mg/dL — ABNORMAL LOW (ref 0.44–1.00)
GFR calc Af Amer: >60 mL/min (ref >60)
GLUCOSE: 83 mg/dL (ref 65–99)
POTASSIUM: 3.2 mmol/L — AB (ref 3.5–5.1)
Sodium: 140 mmol/L (ref 135–145)

## 2015-04-20 LAB — HIV ANTIBODY (ROUTINE TESTING W REFLEX): HIV SCREEN 4TH GENERATION: NONREACTIVE

## 2015-04-20 LAB — BLOOD GAS, ARTERIAL
ACID-BASE EXCESS: 1.2 mmol/L (ref 0.0–2.0)
BICARBONATE: 25.2 meq/L — AB (ref 20.0–24.0)
Drawn by: 317771
O2 CONTENT: 4 L/min
O2 SAT: 95.8 %
PO2 ART: 83.1 mmHg (ref 80.0–100.0)
TCO2: 14 mmol/L (ref 0–100)
pCO2 arterial: 43.7 mmHg (ref 35.0–45.0)
pH, Arterial: 7.386 (ref 7.350–7.450)

## 2015-04-20 LAB — CBC
HCT: 32.4 % — ABNORMAL LOW (ref 36.0–46.0)
HEMOGLOBIN: 10.3 g/dL — AB (ref 12.0–15.0)
MCH: 30.6 pg (ref 26.0–34.0)
MCHC: 31.8 g/dL (ref 30.0–36.0)
MCV: 96.1 fL (ref 78.0–100.0)
Platelets: 116 10*3/uL — ABNORMAL LOW (ref 150–400)
RBC: 3.37 MIL/uL — ABNORMAL LOW (ref 3.87–5.11)
RDW: 18.7 % — AB (ref 11.5–15.5)
WBC: 5.4 10*3/uL (ref 4.0–10.5)

## 2015-04-20 LAB — AMMONIA: Ammonia: 35 umol/L (ref 9–35)

## 2015-04-20 MED ORDER — PANTOPRAZOLE SODIUM 40 MG IV SOLR
40.0000 mg | Freq: Two times a day (BID) | INTRAVENOUS | Status: DC
  Administered 2015-04-20 – 2015-04-21 (×3): 40 mg via INTRAVENOUS
  Filled 2015-04-20 (×3): qty 40

## 2015-04-20 MED ORDER — GUAIFENESIN ER 600 MG PO TB12
1200.0000 mg | ORAL_TABLET | Freq: Two times a day (BID) | ORAL | Status: DC
  Administered 2015-04-22 – 2015-04-25 (×6): 1200 mg via ORAL
  Filled 2015-04-20 (×7): qty 2

## 2015-04-20 MED ORDER — ACETAMINOPHEN 325 MG PO TABS
650.0000 mg | ORAL_TABLET | Freq: Four times a day (QID) | ORAL | Status: DC | PRN
  Administered 2015-04-20 – 2015-04-22 (×2): 650 mg via ORAL
  Filled 2015-04-20 (×2): qty 2

## 2015-04-20 MED ORDER — IPRATROPIUM-ALBUTEROL 0.5-2.5 (3) MG/3ML IN SOLN
3.0000 mL | Freq: Four times a day (QID) | RESPIRATORY_TRACT | Status: DC
  Administered 2015-04-20 – 2015-04-25 (×18): 3 mL via RESPIRATORY_TRACT
  Filled 2015-04-20 (×19): qty 3

## 2015-04-20 MED ORDER — POTASSIUM CHLORIDE 10 MEQ/100ML IV SOLN
10.0000 meq | INTRAVENOUS | Status: AC
  Administered 2015-04-20 (×4): 10 meq via INTRAVENOUS
  Filled 2015-04-20 (×4): qty 100

## 2015-04-20 MED ORDER — MORPHINE SULFATE (PF) 2 MG/ML IV SOLN
2.0000 mg | INTRAVENOUS | Status: DC | PRN
  Administered 2015-04-20 – 2015-04-21 (×4): 2 mg via INTRAVENOUS
  Filled 2015-04-20 (×4): qty 1

## 2015-04-20 MED ORDER — ALBUTEROL SULFATE (2.5 MG/3ML) 0.083% IN NEBU
2.5000 mg | INHALATION_SOLUTION | RESPIRATORY_TRACT | Status: DC | PRN
  Administered 2015-04-21: 2.5 mg via RESPIRATORY_TRACT

## 2015-04-20 MED ORDER — CLINDAMYCIN PHOSPHATE 900 MG/50ML IV SOLN
900.0000 mg | Freq: Three times a day (TID) | INTRAVENOUS | Status: DC
  Administered 2015-04-20 – 2015-04-23 (×9): 900 mg via INTRAVENOUS
  Filled 2015-04-20 (×10): qty 50

## 2015-04-20 MED ORDER — POTASSIUM CHLORIDE CRYS ER 20 MEQ PO TBCR
40.0000 meq | EXTENDED_RELEASE_TABLET | ORAL | Status: AC
  Filled 2015-04-20: qty 2

## 2015-04-20 MED ORDER — ACETAMINOPHEN 650 MG RE SUPP
650.0000 mg | RECTAL | Status: DC | PRN
Start: 2015-04-20 — End: 2015-04-25
  Administered 2015-04-20: 650 mg via RECTAL
  Filled 2015-04-20: qty 1

## 2015-04-20 NOTE — Progress Notes (Signed)
[facility] PROGRESS NOTE  Haley Lowe ZOX:[id number] DOB: 1956/02/28 DOA: 04/18/2015 PCP: [name], MD  Assessment/Plan: 1. Possible aspiration PNA. Daughter reports hx of dysphagia with swallow evaluation in the past. Will add Clindamycin for anaerobic coverage. Continue pulmonary hygiene. Afebrile, but had a temp overnight to 100.6. WBC wnl. BC show no growth to date.  2. Acute respiratory failure with hypoxia, related to PNA. Continue supplemental O2 and wean as tolerated.  3. Hypokalemia, will replete. 4. Dysphagia, patient has had swallow evaluations in the past and was told that she had risk of aspiration. Will request ST to re-evaluate.  5. Dehydration, improving with IVF. Will repeat labs in am.  6. 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 Currently undergoing chemotherapy at [facility]. CT head negative. Has an MRI brain scheduled in 2 weeks. 7. Hypertension, stable. Continue home medications.  8. Chronic pain syndrome, continue current management.  9. GERD, continue PPI.  10. Hypothyroidism, continue Synthroid.   Code Status: Full DVT prophylaxis: SCDs Family Communication: Family at bedside. Disposition Plan: Continue to monitor in ICU.    Consultants:    Procedures:    Antibiotics:  Vancomycin 1/13>>1/14  Aztreonam 1/13>>  Clindamycin 1/15>>  HPI/Subjective: Feels better. Complains of soreness throughout her body.   Objective: Filed Vitals:   04/20/15 0500 04/20/15 0600  BP: 142/89 122/81  Pulse: 91 93  Temp:    Resp: 19 18    Intake/Output Summary (Last 24 hours) at 04/20/15 0726 Last data filed at 04/20/15 0600  Gross per 24 hour  Intake   2450 ml  Output   3250 ml  Net   -800 ml   Filed Weights   04/19/15 0201 04/19/15 0400 04/20/15 0500  Weight: 68.4 kg (150 lb 12.7 oz) 68.266 kg (150 lb 8 oz) 69.1 kg (152 lb 5.4 oz)    Exam:  General: NAD, looks comfortable Cardiovascular: RRR, S1, S2  Respiratory: Coarse breath  sounds at bases Abdomen: soft, non tender, no distention , bowel sounds normal Musculoskeletal: No edema b/l   Data Reviewed: Basic Metabolic Panel:  Recent Labs Lab 04/18/15 2031 04/20/15 0427  NA 139 140  K 3.9 3.2*  CL 103 109  CO2 25 26  GLUCOSE 109* 83  BUN 13 <5*  CREATININE 1.22* 0.31*  CALCIUM 8.9 7.8*   Liver Function Tests:  Recent Labs Lab 04/18/15 2031  AST 40  ALT 48  ALKPHOS 94  BILITOT 0.8  PROT 6.7  ALBUMIN 3.7    CBC:  Recent Labs Lab 04/18/15 2031 04/20/15 0427  WBC 12.5* 5.4  NEUTROABS 10.6*  --   HGB 13.9 10.3*  HCT 44.3 32.4*  MCV 94.5 96.1  PLT 195 116*    BNP (last 3 results)  Recent Labs  03/15/15 2100 04/18/15 2031  BNP 81.0 42.0      Recent Results (from the past 240 hour(s))  Blood culture (routine x 2)     Status: None (Preliminary result)   Collection Time: 04/18/15  9:25 PM  Result Value Ref Range Status   Specimen Description BLOOD RIGHT ARM  Final   Special Requests BOTTLES DRAWN AEROBIC AND ANAEROBIC 4CC  Final   Culture NO GROWTH < 12 HOURS  Final   Report Status PENDING  Incomplete  Blood culture (routine x 2)     Status: None (Preliminary result)   Collection Time: 04/18/15 11:00 PM  Result Value Ref Range Status   Specimen Description BLOOD RIGHT HAND  Final   Special Requests BOTTLES  DRAWN AEROBIC ONLY 2CC  Final   Culture NO GROWTH < 12 HOURS  Final   Report Status PENDING  Incomplete  MRSA PCR Screening     Status: None   Collection Time: 04/19/15  1:50 AM  Result Value Ref Range Status   MRSA by PCR NEGATIVE NEGATIVE Final    Comment:        The GeneXpert MRSA Assay (FDA approved for NASAL specimens only), is one component of a comprehensive MRSA colonization surveillance program. It is not intended to diagnose MRSA infection nor to guide or monitor treatment for MRSA infections.      Studies: Ct Head W Wo Contrast  04/19/2015  CLINICAL DATA:  Altered mental status, history of brain  metastasis. Status post LEFT frontal craniotomy. History of metastatic thyroid cancer, borderline diabetes, hypertension. EXAM: CT HEAD WITHOUT AND WITH CONTRAST TECHNIQUE: Contiguous axial images were obtained from the base of the skull through the vertex without and with intravenous contrast CONTRAST:  12m OMNIPAQUE IOHEXOL 300 MG/ML  SOLN COMPARISON:  CT head March 15, 2015 and MRI of the head January 23, 2015 FINDINGS: No enhancing intracranial masses. 7 mm hypodensity in LEFT cerebellum likely represents patient's known metastasis, 15 mm previously. LEFT frontal encephalomalacia was present on prior imaging. No midline shift or mass effect. Ventricles and sulci are normal for age. No abnormal extra-axial fluid collections or extra-axial enhancement. Mild calcific atherosclerosis of the carotid siphons. Old LEFT frontal craniotomy. No skull fracture. Paranasal sinuses and mastoid air cells are well aerated. Ocular globes and orbital contents are normal. Status post RIGHT ocular lens implant. IMPRESSION: No acute intracranial process. No enhancing intracranial masses though, MRI of the brain with contrast would be more sensitive for assessment of residual or new metastasis. 7 mm hypodensity LEFT cerebellum likely representing interval response to treatment of known metastasis. LEFT frontal craniotomy and subjacent encephalomalacia. Electronically Signed   By: CElon AlasM.D.   On: 04/19/2015 00:42   Dg Chest Portable 1 View  04/18/2015  CLINICAL DATA:  Fever and shortness of breath for 4 days. Pneumonia. Currently undergoing chemotherapy for brain cancer. EXAM: PORTABLE CHEST 1 VIEW COMPARISON:  04/02/2015 FINDINGS: Mild worsening of bibasilar airspace disease is seen since prior study. Mild cardiomegaly stable. No evidence of pneumothorax or pleural effusion. IMPRESSION: Mild worsening of bibasilar airspace disease, suspicious for pneumonia. Electronically Signed   By: JEarle GellM.D.   On:  04/18/2015 21:08    Scheduled Meds: . amitriptyline  25 mg Oral QHS  . amLODipine  10 mg Oral Daily  . aztreonam  1 g Intravenous Q8H  . feeding supplement (ENSURE ENLIVE)  237 mL Oral BID BM  . levothyroxine  137 mcg Oral QAC breakfast  . pantoprazole  40 mg Oral BID  . polyethylene glycol  17 g Oral QODAY  . sucralfate  1 g Oral TID WC & HS  . valACYclovir  1,000 mg Oral Daily   Continuous Infusions: . sodium chloride 100 mL/hr at 04/20/15 0600    Active Problems:   Metastatic adenocarcinoma to brain, thyroid cancer   HCAP (healthcare-associated pneumonia)   Pneumonia   Dehydration    Time spent: 25 minutes   Jehanzeb Memon. MD [facility] Pager [phone] If 7PM-7AM, please contact night-coverage at [email or web address], password [email or web address]1/[facility]/2017, 7:26 AM  LOS: 1 day      By signing my name below, I, JRosalie Doctor attest that this documentation has been prepared under the direction and in the presence  of [employer]. MD Electronically Signed: Rosalie Doctor, Scribe.  04/20/2015  9:32am   I, Dr. Kathie Dike, personally performed the services described in this documentaiton. All medical record entries made by the scribe were at my direction and in my presence. I have reviewed the chart and agree that the record reflects my personal performance and is accurate and complete  Kathie Dike, MD, 04/20/2015 10:00 AM

## 2015-04-21 ENCOUNTER — Encounter (HOSPITAL_COMMUNITY): Payer: Self-pay | Admitting: Primary Care

## 2015-04-21 DIAGNOSIS — Z515 Encounter for palliative care: Secondary | ICD-10-CM

## 2015-04-21 DIAGNOSIS — Z7189 Other specified counseling: Secondary | ICD-10-CM

## 2015-04-21 LAB — CBC
HCT: 31.7 % — ABNORMAL LOW (ref 36.0–46.0)
Hemoglobin: 10.3 g/dL — ABNORMAL LOW (ref 12.0–15.0)
MCH: 30.7 pg (ref 26.0–34.0)
MCHC: 32.5 g/dL (ref 30.0–36.0)
MCV: 94.6 fL (ref 78.0–100.0)
PLATELETS: 101 10*3/uL — AB (ref 150–400)
RBC: 3.35 MIL/uL — AB (ref 3.87–5.11)
RDW: 18.6 % — ABNORMAL HIGH (ref 11.5–15.5)
WBC: 4.3 10*3/uL (ref 4.0–10.5)

## 2015-04-21 LAB — BASIC METABOLIC PANEL
Anion gap: 7 (ref 5–15)
BUN: 6 mg/dL (ref 6–20)
CHLORIDE: 110 mmol/L (ref 101–111)
CO2: 25 mmol/L (ref 22–32)
Calcium: 7.9 mg/dL — ABNORMAL LOW (ref 8.9–10.3)
Glucose, Bld: 95 mg/dL (ref 65–99)
Potassium: 3.4 mmol/L — ABNORMAL LOW (ref 3.5–5.1)
Sodium: 142 mmol/L (ref 135–145)

## 2015-04-21 LAB — URINE CULTURE: Culture: NO GROWTH

## 2015-04-21 LAB — LEGIONELLA ANTIGEN, URINE

## 2015-04-21 MED ORDER — POTASSIUM CHLORIDE 10 MEQ/100ML IV SOLN
10.0000 meq | INTRAVENOUS | Status: AC
  Administered 2015-04-21 (×4): 10 meq via INTRAVENOUS
  Filled 2015-04-21 (×4): qty 100

## 2015-04-21 MED ORDER — MORPHINE SULFATE (PF) 2 MG/ML IV SOLN
2.0000 mg | INTRAVENOUS | Status: DC | PRN
  Administered 2015-04-21 – 2015-04-23 (×7): 2 mg via INTRAVENOUS
  Filled 2015-04-21 (×9): qty 1

## 2015-04-21 MED ORDER — PANTOPRAZOLE SODIUM 40 MG PO TBEC
40.0000 mg | DELAYED_RELEASE_TABLET | Freq: Two times a day (BID) | ORAL | Status: DC
Start: 2015-04-21 — End: 2015-04-25
  Administered 2015-04-22 – 2015-04-25 (×7): 40 mg via ORAL
  Filled 2015-04-21 (×7): qty 1

## 2015-04-21 NOTE — Evaluation (Signed)
Clinical/Bedside Swallow Evaluation Patient Details  Name: Haley Lowe MRN: [id number] Date of Birth: October 21, 1955  Today's Date: 04/21/2015 Time: SLP Start Time (ACUTE ONLY): 7341 SLP Stop Time (ACUTE ONLY): 1621 SLP Time Calculation (min) (ACUTE ONLY): 36 min  Past Medical History:  Past Medical History  Diagnosis Date  . Essential hypertension   . Anxiety   . Depression   . COPD (chronic obstructive pulmonary disease) ([place])   . GERD (gastroesophageal reflux disease)   . Headache(784.0)   . Arthritis   . Hx of adenomatous colonic polyps   . Emphysema   . Cataract     1 lens implaqnt right eye,intact on left eye  . Goiter   . Fibromyalgia   . Pulmonary emboli ([place]) 11/04/11    Right upper lobe and r lower lobe PE  . Lung nodule seen on imaging study 11/04/11 CT    13m LLL  . DVT (deep venous thrombosis) ([place] 12/09/2011  . [place] embolism ([place] 12/09/2011  . Hyperlipidemia   . History of radiation therapy 10/06/11    SRS 15Gy 125f brain  . UTI (lower urinary tract infection)   . Borderline diabetes mellitus   . Metastatic adenocarcinoma to brain ([facility]/12/13    Left frontoparietal region  . Thyroid cancer ([facility]  . S/P radiation therapy 05/31/14 SRS    Brain  . PONV (postoperative nausea and vomiting)   . Controlled diabetes mellitus type II without complication ([id number]  Previously diagnosed with borderline diabetes.  . Marland Kitchenerpes simplex esophagitis 03/11/2015    Also candidal esophagitis  . HCAP (healthcare-associated pneumonia) 03/03/2015  . Mucositis due to radiation therapy 03/03/2015   Past Surgical History:  Past Surgical History  Procedure Laterality Date  . Abdominal hysterectomy  1996  . Cholecystectomy  2000  . Shoulder surgery Right 1998  . Eye surgery  2001  . Foot surgery Left 2005  . Craniotomy  09/15/2011    Procedure: CRANIOTOMY TUMOR EXCISION;  Surgeon: RoHosie SpangleMD;  [facility];  Service: Neurosurgery;  Laterality: N/A;   Craniotomy resection of tumor with stealth  . Cataract extraction  2011    With lens implant  . [facility]29-2013    [facility]    . Video bronchoscopy with endobronchial ultrasound N/A 04/11/2014    Procedure: VIDEO BRONCHOSCOPY WITH ENDOBRONCHIAL ULTRASOUND;  Surgeon: StMelrose NakayamaMD;  [facility] Service: Thoracic;  Laterality: N/A;  . Esophagogastroduodenoscopy (egd) with propofol N/A 03/07/2015    Procedure: ESOPHAGOGASTRODUODENOSCOPY (EGD) WITH PROPOFOL;  Surgeon: RoDaneil DolinMD;  Location: [facility];  Service: Endoscopy;  Laterality: N/A;  . Esophageal biopsy  03/07/2015    Procedure: BIOPSY;  Surgeon: RoDaneil DolinMD;  Location: [facility];  Service: Endoscopy;;   HPI:  60 year old female with history of thyroid cancer, metastatic adenocarcinoma to brain, [place] [facility] [place] [facility] with gradually worsening weakness and drowsiness since last night. Patient is very somnolent at this time and history obtained from patient's husband at bedside. As per patient's husband patient reported mild shortness of breath and worsening pain she has had a nonproductive cough for past 4 days. Patient is high dose morphine 60 mg twice a day and last dose was around 8 AM in the morning. Patient had chemotherapy in November 2016 at that time she was treated for shingles and pneumonia.  Haley Lowe was treated with Doxycycline on 03/29/15 twice a day for 10 days but CT scan still showed pneumonia 2 days ago so she was then placed back on the Doxycycline yesterday by her PCP. Patient is on home oxygen, she received radiation treatment for the brain metastasis and has upcoming MRI in 2 weeks. CT head done today showed no significant abnormality. In the ED chest x-ray showed bilateral basilar pneumonia. Patient started on vancomycin and Azactam per pharmacy. SLP asked to evaluate  swallow.   Assessment / Plan / Recommendation Clinical Impression  Haley Lowe is known to this SLP from her admission and MBSS in December 2016. Upon SLP arrival, she was observed with audible, congested breathing with weak cough. Husband at bedside and reports that after she was discharged from the hospital last month, she was "eating pretty well" and drinking throughout the day. MBSS during last visit showed delay in swallow initiation, pharyngeal weakness, and penetration/aspiration of thins. D3/thin was recommended with implementation of throat clear and repeat swallow strategy. Today, Haley Lowe coughing excessively before, during, and after po so difficult to determine impact of po intake aspiration risk. Radiation changes can cause tissue fibrosis which can make swallowing challenging. Swallow initiation appears delayed with reduced hyolaryngeal excursion. Given known aspiration with  thin liquids and current poor functional reserve to protect airway, will downgrade diet to D1/puree with nectar-thick liquids via cup/spoon sips. Haley Lowe declines po intake at this time and reportedly has not eaten much in the past few days. SLP will follow up tomorrow for diet tolerance. OK for Haley Lowe to have ice chips after oral care for comfort. Haley Lowe/husband in agreement with plan of care.     Aspiration Risk  Moderate aspiration risk    Diet Recommendation Dysphagia 1 (Puree);Nectar-thick liquid   Liquid Administration via: Spoon;Cup Medication Administration: Crushed with puree Supervision: Staff to assist with self feeding;Full supervision/cueing for compensatory strategies Compensations: Slow rate;Small sips/bites;Multiple dry swallows after each bite/sip;Clear throat intermittently Postural Changes: Seated upright at 90 degrees;Remain upright for at least 30 minutes after po intake    Other  Recommendations Oral Care Recommendations: Oral care BID;Oral care prior to ice chip/H20;Staff/trained caregiver to provide oral  care Other Recommendations: Order thickener from pharmacy;Have oral suction available;Remove water pitcher;Clarify dietary restrictions   Follow up Recommendations  24 hour supervision/assistance (pending)    Frequency and Duration min 2x/week  1 week       Prognosis Prognosis for Safe Diet Advancement: Guarded      Swallow Study   General Date of Onset: 04/18/15 HPI: 60 year old female with history of thyroid cancer, metastatic adenocarcinoma to brain, who is currently undergoing chemotherapy at [place]. Today presents to the hospital with gradually worsening weakness and drowsiness since last night. Patient is very somnolent at this time and history obtained from patient's husband at bedside. As per patient's husband patient reported mild shortness of breath and worsening pain she has had a nonproductive cough for past 4 days. Patient is high dose morphine 60 mg twice a day and last dose was around 8 AM in the morning. Patient had chemotherapy in November 2016 at that time she was treated for shingles and pneumonia. Haley Lowe was treated with Doxycycline on 03/29/15 twice a day for 10 days but CT scan still showed pneumonia 2 days ago so she was then placed back on the Doxycycline yesterday by her PCP. Patient is on home oxygen, she received radiation treatment for the brain metastasis and has upcoming MRI in 2 weeks.  CT head done today showed no significant abnormality. In the ED chest x-ray showed bilateral basilar pneumonia. Patient started on vancomycin and Azactam per pharmacy. SLP asked to evaluate swallow. Type of Study: Bedside Swallow Evaluation Previous Swallow Assessment: December 2016 with rec for D3/thin; clear throat with thins Diet Prior to this Study: Regular;Thin liquids Temperature Spikes Noted: No Respiratory Status: Nasal cannula History of Recent Intubation: No Behavior/Cognition: Alert;Cooperative;Confused Oral Cavity Assessment: Dried secretions (along soft  palate) Oral Care Completed by SLP: Yes (limited due to oral defensiveness) Vision: Functional for self-feeding Self-Feeding Abilities: Needs assist Patient Positioning: Upright in bed Baseline Vocal Quality: Normal;Low vocal intensity Volitional Cough: Weak;Congested Volitional Swallow: Able to elicit    Oral/Motor/Sensory Function Overall Oral Motor/Sensory Function: Generalized oral weakness Velum: Within Functional Limits (dried secretions noted) Mandible: Within Functional Limits   Ice Chips Ice chips: Within functional limits Presentation: Spoon Other Comments:  (delayed coughing)   Thin Liquid Thin Liquid: Impaired Presentation: Spoon;Cup Oral Phase Functional Implications: Prolonged oral transit Pharyngeal  Phase Impairments: Suspected delayed Swallow;Cough - Delayed    Nectar Thick Nectar Thick Liquid: Impaired Presentation: Spoon Pharyngeal Phase Impairments: Suspected delayed Swallow;Cough - Delayed   Honey Thick Honey Thick Liquid: Not tested   Puree Puree: Impaired Presentation: Spoon Oral Phase Impairments: Reduced lingual movement/coordination Oral Phase Functional Implications: Prolonged oral transit   Solid   Thank you,  Genene Churn, CCC-SLP [phone]    Solid: Not tested        Leisl Spurrier 04/21/2015,5:11 PM

## 2015-04-21 NOTE — Progress Notes (Signed)
02 saturations dropped, increased high flow nasal cannula to 15L. RT will continue to monitor and wean as tolerated.

## 2015-04-21 NOTE — Consult Note (Signed)
Consultation Note Date: 04/21/2015   Patient Name: Haley Lowe  DOB: 17-Jan-1956  MRN: [id number]  Age / Sex: 60 y.o., female  PCP: Redmond School, MD Referring Physician: Kathie Dike, MD  Reason for Consultation: Establishing goals of care and Psychosocial/spiritual support    Clinical Assessment/Narrative: Mrs. "Collie Siad" Lowe is resting in bed, her husband Marlou Sa at her bedside.  She tells me that she is having some pain from coughing. Marlou Sa states she just got morphine, but it makes her "loopy".  She is lethargic, with low voice, and seems slow to answer questions.  She agrees that it is ok for me to talk with Marlou Sa.  We talk about her current health concerns including aspiration PNE.  Marlou Sa shares that she had this issue after her last hospitalization, (she was on thickened liquids "but didn't need anymore").  We talk about the likelihood that she will continue to need special diet rt her surgery and radiation treatments for thyroid cancer.    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  Marlou Sa states that she would want to be intubated "if it would help".  We talk about not being able to speak, and being sedated.  I share my worry over her frankly wet cough that she is unable to clear, (she coughs every few minutes).  I ask Marlou Sa what worries him the most, "that she might not be able to get out of here".     Contacts/Participants in Discussion: [name] and husband, Marlou Sa.  Primary Decision Maker: [name], and husband Marlou Sa.    Relationship to Patient Husband.  HCPOA: no   SUMMARY OF RECOMMENDATIONS  Code Status/Advance Care Planning: Full code - we discuss the realities of intubation, elective vs emergent, not being able to speak while vented, and being sedated.     Code Status Orders        Start     Ordered   04/19/15 0228  Full code   Continuous     04/19/15 0227    Code Status History    Date Active Date Inactive Code  Status Order ID Comments User Context   03/15/2015 10:21 PM 03/18/2015  6:09 PM Full Code [id number]  Doree Albee, MD ED   03/03/2015  5:16 AM 03/13/2015  3:58 PM Full Code [id number]  Theressa Millard, MD Inpatient   07/28/2014  9:12 PM 07/29/2014  8:53 PM Full Code [id number]  Doree Albee, MD ED   09/15/2011  7:36 PM 09/19/2011  2:16 PM Full Code [id number]  Lynn Ito, RN Inpatient      Other Directives:None  Symptom Management:   Per hospitalist  Palliative Prophylaxis:   Aspiration, Frequent Pain Assessment and Turn Reposition  Additional Recommendations (Limitations, Scope, Preferences):  Full Scope Treatment at this time.  Psycho-social/Spiritual:  Support System: Strong, Married to Scientist, physiological for 59 years, 3 daughters, [name] and Letta Median who work at [employer] and [employer].  (Dean's stepdaughters) Desire for further Chaplaincy support:Not discussed today.  Additional Recommendations: None at this time.   Prognosis: Unable to determine, based on outcomes.  MRI end of Jan.   Discharge Planning: Home with Home Health, if possible.    Chief Complaint/ Primary Diagnoses: Present on Admission:  . Dehydration . Pneumonia . Metastatic adenocarcinoma to brain, thyroid cancer . HCAP (healthcare-associated pneumonia) . Acute respiratory failure with hypoxia ([place])  I have reviewed the medical record, interviewed the patient and family, and examined the patient. The following aspects are pertinent.  Past Medical History  Diagnosis Date  . Essential hypertension   . Anxiety   . Depression   . COPD (chronic obstructive pulmonary disease) ([place])   . GERD (gastroesophageal reflux disease)   . Headache(784.0)   . Arthritis   . Hx of adenomatous colonic polyps   . Emphysema   . Cataract     1 lens implaqnt right eye,intact on left eye  . Goiter   . Fibromyalgia   . Pulmonary emboli ([place]) 11/04/11    Right upper lobe and r lower lobe PE  . Lung nodule seen on imaging study  11/04/11 CT    57m LLL  . DVT (deep venous thrombosis) ([facility] 12/09/2011  . Pulmonary embolism ([facility] 12/09/2011  . Hyperlipidemia   . History of radiation therapy 10/06/11    SRS 15Gy 161f brain  . UTI (lower urinary tract infection)   . Borderline diabetes mellitus   . Metastatic adenocarcinoma to brain ([facility]/12/13    Left frontoparietal region  . Thyroid cancer ([facility]  . S/P radiation therapy 05/31/14 SRS    Brain  . PONV (postoperative nausea and vomiting)   . Controlled diabetes mellitus type II without complication ([id number]  Previously diagnosed with borderline diabetes.  . Marland Kitchenerpes simplex esophagitis 03/11/2015    Also candidal esophagitis  . HCAP (healthcare-associated pneumonia) 03/03/2015  . Mucositis due to radiation therapy 03/03/2015   Social History   Social History  . Marital Status: Married    Spouse Name: N/A  . Number of Children: 4  . Years of Education: N/A   Occupational History  . disabled     prev Time WaSuzan Slick Social History Main Topics  . Smoking status: Former Smoker -- 1.00 packs/day for 40 years    Types: Cigarettes    Quit date: 04/06/2011  . Smokeless tobacco: Never Used  . Alcohol Use: No  . Drug Use: No  . Sexual Activity: Yes   Other Topics Concern  . None   Social History Narrative   Married, unemployed   Family History  Problem Relation Age of Onset  . Asthma Mother   . Kidney failure Father   . Diabetes Sister   . Heart attack Sister   . Colon cancer Brother   . COPD Sister   . Aneurysm Paternal Grandmother     Brain  . Parkinsonism Maternal Uncle   . COPD Brother    Scheduled Meds: . amitriptyline  25 mg Oral QHS  . amLODipine  10 mg Oral Daily  . aztreonam  1 g Intravenous Q8H  . clindamycin (CLEOCIN) IV  900 mg Intravenous 3 times per day  . feeding supplement (ENSURE ENLIVE)  237 mL Oral BID BM  . guaiFENesin  1,200 mg Oral BID  . ipratropium-albuterol  3 mL Nebulization Q6H  . levothyroxine  137 mcg Oral  QAC breakfast  . pantoprazole  40 mg Oral BID  . polyethylene glycol  17 g Oral QODAY  . sucralfate  1 g Oral TID WC & HS  . valACYclovir  1,000 mg Oral Daily   Continuous Infusions: . sodium chloride 100 mL/hr at 04/21/15 1147   PRN Meds:.acetaminophen, acetaminophen, albuterol, ALPRAZolam, bisacodyl, magic mouthwash **AND** lidocaine, morphine injection Medications Prior to Admission:  Prior to Admission medications   Medication Sig Start Date End Date Taking? Authorizing Provider  ALPRAZolam (XDuanne Moron0.5 MG tablet Take 0.5 mg by mouth 3 (three) times daily as needed for anxiety.  01/04/12  Yes Historical Provider, MD  amitriptyline (ELAVIL) 50 MG tablet Take 50 mg by mouth at bedtime.   Yes Historical Provider, MD  amLODipine (NORVASC) 10 MG tablet Take 10 mg by mouth daily.   Yes Historical Provider, MD  doxycycline (VIBRAMYCIN) 100 MG capsule Take 100 mg by mouth 2 (two) times daily. Starting 04/17/2015 x 14 days.   Yes Historical Provider, MD  levothyroxine (SYNTHROID, LEVOTHROID) 175 MCG tablet Take 175 mcg by mouth daily before breakfast.   Yes Historical Provider, MD  magic mouthwash w/lidocaine SOLN Take 5 mLs by mouth 4 (four) times daily as needed for mouth pain. 03/13/15  Yes Velvet Bathe, MD  Magnesium 250 MG TABS Take 1 tablet by mouth daily.   Yes Historical Provider, MD  morphine (MS CONTIN) 60 MG 12 hr tablet Take 1 tablet (60 mg total) by mouth every 12 (twelve) hours. Use this medication with caution as it can cause confusion and can affect her breathing. 03/18/15  Yes Rexene Alberts, MD  ondansetron (ZOFRAN ODT) 8 MG disintegrating tablet Take 1 tablet (8 mg total) by mouth every 8 (eight) hours as needed for nausea or vomiting. 04/05/14  Yes Pattricia Boss, MD  Oxycodone HCl 20 MG TABS Take 1 tablet by mouth every 4 (four) hours as needed (pain).   Yes Historical Provider, MD  pantoprazole (PROTONIX) 40 MG tablet Take 1 tablet (40 mg total) by mouth 2 (two) times daily. 03/13/15   Yes Velvet Bathe, MD  polyethylene glycol (MIRALAX / GLYCOLAX) packet Take 17 g by mouth every other day. For constipation   Yes Historical Provider, MD  Simethicone (GAS-X EXTRA STRENGTH PO) Take 1 tablet by mouth daily as needed (gas relief).   Yes Historical Provider, MD  sucralfate (CARAFATE) 1 G tablet Take 1 tablet (1 g total) by mouth 4 (four) times daily -  with meals and at bedtime. 03/13/15  Yes Velvet Bathe, MD  valACYclovir (VALTREX) 500 MG tablet Take 500 mg by mouth daily.   Yes Historical Provider, MD  acyclovir (ZOVIRAX) 200 MG capsule Take 2 capsules (400 mg total) by mouth 3 (three) times daily. Continue through 03/23/15 or until completed. Patient not taking: Reported on 04/19/2015 03/18/15   Rexene Alberts, MD  cefUROXime (CEFTIN) 500 MG tablet Take 1 tablet (500 mg total) by mouth 2 (two) times daily with a meal. Antibiotic be taken as directed, starting tomorrow on 03/19/15. Patient not taking: Reported on 04/14/2015 03/18/15   Rexene Alberts, MD  fluconazole (DIFLUCAN) 100 MG tablet Take 1 tablet (100 mg total) by mouth daily. Continue through 03/23/15 or until completion. Patient not taking: Reported on 04/14/2015 03/18/15   Rexene Alberts, MD  glimepiride (AMARYL) 1 MG tablet Take a half a tablet daily as needed for a blood sugar greater 150 or greater. Patient not taking: Reported on 04/14/2015 03/18/15   Rexene Alberts, MD  guaiFENesin-dextromethorphan [facility] DM) 100-10 MG/5ML syrup Take 5 mLs by mouth every 4 (four) hours as needed for cough. Patient not taking: Reported on 04/14/2015 03/18/15   Rexene Alberts, MD  Wound Dressings [facility]) Apply 1 application topically daily.    Historical Provider, MD   Allergies  Allergen Reactions  . Penicillins Swelling    " my brain swelled, and mymouth" Has patient had a PCN reaction causing immediate rash, facial/tongue/throat swelling, SOB or lightheadedness with hypotension: No Has patient had a PCN reaction causing severe rash  involving mucus membranes or skin necrosis: No Has patient had a PCN reaction that required hospitalization No Has patient had  a PCN reaction occurring within the last 10 years: No If all of the above answers are "NO", then may proceed with Cephalosporin use.   . Other Swelling    PT ALSO REACTS TO PAPER TAPE AND BAND-AIDS.  Marland Kitchen Codeine Itching    Mild takes benadryl  . Tape Rash    Review of Systems  Physical Exam  Vital Signs: BP 115/84 mmHg  Pulse 102  Temp(Src) 98.1 F (36.7 C) (Oral)  Resp 26  Ht '5\' 4"'$  (1.626 m)  Wt 69.1 kg (152 lb 5.4 oz)  BMI 26.14 kg/m2  SpO2 94%  SpO2: SpO2: 94 % O2 Device:SpO2: 94 % O2 Flow Rate: .O2 Flow Rate (L/min): 4 L/min  IO: Intake/output summary:  Intake/Output Summary (Last 24 hours) at 04/21/15 1610 Last data filed at 04/21/15 1301  Gross per 24 hour  Intake   2100 ml  Output   3025 ml  Net   -925 ml    LBM: Last BM Date: 04/19/15 Baseline Weight: Weight: 66.679 kg (147 lb) Most recent weight: Weight: 69.1 kg (152 lb 5.4 oz)      Palliative Assessment/Data:  Flowsheet Rows        Most Recent Value   Intake Tab    Referral Department  Hospitalist   Unit at Time of Referral  ICU   Palliative Care Primary Diagnosis  Pulmonary   Date Notified  04/20/15   Palliative Care Type  New Palliative care   Reason for referral  Clarify Goals of Care, End of Life Care Assistance, Psychosocial or Spiritual support, Advance Care Planning   Date of Admission  04/18/15   Date first seen by Palliative Care  04/21/15   # of days Palliative referral response time  1 Day(s)   # of days IP prior to Palliative referral  2   Clinical Assessment    Palliative Performance Scale Score  40%   Pain Max last 24 hours  Not able to report   Pain Min Last 24 hours  Not able to report   Dyspnea Max Last 24 Hours  Not able to report   Dyspnea Min Last 24 hours  Not able to report   Psychosocial & Spiritual Assessment    Social Work Plan of Care  Staff  support   Palliative Care Outcomes    Patient/Family meeting held?  Yes   Who was at the meeting?  husband [place] goals of care, Provided psychosocial or spiritual support   Palliative Care follow-up planned  Yes, Facility      Additional Data Reviewed:  CBC:    Component Value Date/Time   WBC 4.3 04/21/2015 0441   WBC 14.4* 03/03/2012 1033   HGB 10.3* 04/21/2015 0441   HGB 11.8 03/03/2012 1033   HCT 31.7* 04/21/2015 0441   HCT 35.1 03/03/2012 1033   PLT 101* 04/21/2015 0441   PLT 161 03/03/2012 1033   MCV 94.6 04/21/2015 0441   MCV 97.2 03/03/2012 1033   NEUTROABS 10.6* 04/18/2015 2031   NEUTROABS 10.7* 03/03/2012 1033   LYMPHSABS 0.6* 04/18/2015 2031   LYMPHSABS 2.4 03/03/2012 1033   MONOABS 1.1* 04/18/2015 2031   MONOABS 1.1* 03/03/2012 1033   EOSABS 0.3 04/18/2015 2031   EOSABS 0.2 03/03/2012 1033   BASOSABS 0.0 04/18/2015 2031   BASOSABS 0.1 03/03/2012 1033   Comprehensive Metabolic Panel:    Component Value Date/Time   NA 142 04/21/2015 0441   NA 140 03/03/2012 1033  K 3.4* 04/21/2015 0441   K 3.8 03/03/2012 1033   CL 110 04/21/2015 0441   CL 101 03/03/2012 1033   CO2 25 04/21/2015 0441   CO2 30* 03/03/2012 1033   BUN 6 04/21/2015 0441   BUN 15.0 03/03/2012 1033   CREATININE <0.30* 04/21/2015 0441   CREATININE 0.6 03/03/2012 1033   GLUCOSE 95 04/21/2015 0441   GLUCOSE 147* 03/03/2012 1033   CALCIUM 7.9* 04/21/2015 0441   CALCIUM 9.2 03/03/2012 1033   AST 40 04/18/2015 2031   AST 20 03/03/2012 1033   ALT 48 04/18/2015 2031   ALT 44 03/03/2012 1033   ALKPHOS 94 04/18/2015 2031   ALKPHOS 115 03/03/2012 1033   BILITOT 0.8 04/18/2015 2031   BILITOT 0.31 03/03/2012 1033   PROT 6.7 04/18/2015 2031   PROT 6.0* 03/03/2012 1033   ALBUMIN 3.7 04/18/2015 2031   ALBUMIN 3.6 03/03/2012 1033     Time In: 1430 Time Out: 1530 Time Total: 2 Minutes GOC discussion shared with nursing staff, CM, SW, and Dr. Roderic Palau.    Greater than 50%  of this time was spent counseling and coordinating care related to the above assessment and plan.  Signed by: Drue Novel, NP  Drue Novel, NP  04/21/2015, 4:10 PM  Please contact Palliative Medicine Team phone at [phone] for questions and concerns.

## 2015-04-21 NOTE — Progress Notes (Signed)
The patient is receiving Protonix by the intravenous route.  Based on criteria approved by the Pharmacy and [place], the medication is being converted to the equivalent oral dose form.  These criteria include: -No Active GI bleeding -Able to tolerate diet of full liquids (or better) or tube feeding OR able to tolerate other medications by the oral or enteral route  If you have any questions about this conversion, please contact the Pharmacy Department (ext [phone]).  Thank you.  Ena Dawley, [facility] 04/21/2015 10:57 AM

## 2015-04-21 NOTE — Progress Notes (Signed)
[facility] PROGRESS NOTE  Haley Lowe FIE:[id number] DOB: 26-Dec-1955 DOA: 04/18/2015 PCP: [name], MD  Assessment/Plan: 1. Possible aspiration PNA. Daughter reports hx of dysphagia with swallow evaluation in the past. Clindamycin added for anaerobic coverage. Will continue IV abx. Continue pulmonary hygiene. Remains afebrile for 24 hours. PMT consult to speak with family concerning repeat pna and goals of care.  2. Acute respiratory failure with hypoxia, related to PNA. Required Venti-mask overnight, was able to wean off and is now currently tolerating HFNC. Continue supplemental O2 and wean as tolerated.  3. Hypokalemia, improving. 4. Dysphagia, patient has had swallow evaluations in the past and was told that she had risk of aspiration. ST evaluation requested.   5. Dehydration, resolved. 6. 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 Currently undergoing chemotherapy at [facility]. CT head negative. Has an MRI brain scheduled in 2 weeks. 7. Hypertension, stable. Continue home medications.  8. Chronic pain syndrome, continue current management.  9. GERD, continue PPI.  10. Hypothyroidism, continue Synthroid.   Code Status: Full DVT prophylaxis: SCDs Family Communication: Family at bedside. Disposition Plan: Continue to monitor in ICU.    Consultants:  None  Procedures:  None   Antibiotics:  Vancomycin 1/13>>1/14  Aztreonam 1/13>>  Clindamycin 1/15>>  HPI/Subjective: Feels as though she is doing ok. Breathing is improving. Mild productive cough.  Family bedside reports that she is more alert today and has a productive cough. She is requesting food and has been in increased pain, they are requesting frequent pain medications.   Objective: Filed Vitals:   04/21/15 0500 04/21/15 0600  BP: 136/77 114/91  Pulse: 90 97  Temp:    Resp: 23 16    Intake/Output Summary (Last 24 hours) at 04/21/15 0625 Last data filed at 04/21/15 0600  Gross per 24 hour   Intake   3100 ml  Output    900 ml  Net   2200 ml   Filed Weights   04/19/15 0201 04/19/15 0400 04/20/15 0500  Weight: 68.4 kg (150 lb 12.7 oz) 68.266 kg (150 lb 8 oz) 69.1 kg (152 lb 5.4 oz)    Exam:  General: NAD. More alert today. Response to command.   Cardiovascular: Regular rate and rhythm  Respiratory:Crackles at bases.  Abdomen: soft, non tender, no distention   Musculoskeletal: No edema b/l  Data Reviewed: Basic Metabolic Panel:  Recent Labs Lab 04/18/15 2031 04/20/15 0427 04/21/15 0441  NA 139 140 142  K 3.9 3.2* 3.4*  CL 103 109 110  CO2 '25 26 25  '$ GLUCOSE 109* 83 95  BUN 13 <5* 6  CREATININE 1.22* 0.31* <0.30*  CALCIUM 8.9 7.8* 7.9*   Liver Function Tests:  Recent Labs Lab 04/18/15 2031  AST 40  ALT 48  ALKPHOS 94  BILITOT 0.8  PROT 6.7  ALBUMIN 3.7    CBC:  Recent Labs Lab 04/18/15 2031 04/20/15 0427 04/21/15 0441  WBC 12.5* 5.4 4.3  NEUTROABS 10.6*  --   --   HGB 13.9 10.3* 10.3*  HCT 44.3 32.4* 31.7*  MCV 94.5 96.1 94.6  PLT 195 116* 101*    BNP (last 3 results)  Recent Labs  03/15/15 2100 04/18/15 2031  BNP 81.0 42.0      Recent Results (from the past 240 hour(s))  Blood culture (routine x 2)     Status: None (Preliminary result)   Collection Time: 04/18/15  9:25 PM  Result Value Ref Range Status   Specimen Description BLOOD RIGHT ARM  Final  Special Requests BOTTLES DRAWN AEROBIC AND ANAEROBIC 4CC  Final   Culture NO GROWTH 2 DAYS  Final   Report Status PENDING  Incomplete  Urine culture     Status: None (Preliminary result)   Collection Time: 04/18/15 10:30 PM  Result Value Ref Range Status   Specimen Description URINE, CATHETERIZED  Final   Special Requests NONE  Final   Culture   Final    NO GROWTH < 24 HOURS Performed at [facility]    Report Status PENDING  Incomplete  Blood culture (routine x 2)     Status: None (Preliminary result)   Collection Time: 04/18/15 11:00 PM  Result Value Ref  Range Status   Specimen Description BLOOD RIGHT HAND  Final   Special Requests BOTTLES DRAWN AEROBIC ONLY 2CC  Final   Culture NO GROWTH 2 DAYS  Final   Report Status PENDING  Incomplete  MRSA PCR Screening     Status: None   Collection Time: 04/19/15  1:50 AM  Result Value Ref Range Status   MRSA by PCR NEGATIVE NEGATIVE Final    Comment:        The GeneXpert MRSA Assay (FDA approved for NASAL specimens only), is one component of a comprehensive MRSA colonization surveillance program. It is not intended to diagnose MRSA infection nor to guide or monitor treatment for MRSA infections.      Studies: No results found.  Scheduled Meds: . amitriptyline  25 mg Oral QHS  . amLODipine  10 mg Oral Daily  . aztreonam  1 g Intravenous Q8H  . clindamycin (CLEOCIN) IV  900 mg Intravenous 3 times per day  . feeding supplement (ENSURE ENLIVE)  237 mL Oral BID BM  . guaiFENesin  1,200 mg Oral BID  . ipratropium-albuterol  3 mL Nebulization Q6H  . levothyroxine  137 mcg Oral QAC breakfast  . pantoprazole (PROTONIX) IV  40 mg Intravenous Q12H  . polyethylene glycol  17 g Oral QODAY  . sucralfate  1 g Oral TID WC & HS  . valACYclovir  1,000 mg Oral Daily   Continuous Infusions: . sodium chloride 100 mL/hr at 04/21/15 0600    Active Problems:   Metastatic adenocarcinoma to brain, thyroid cancer   HCAP (healthcare-associated pneumonia)   Pneumonia   Dehydration   Acute respiratory failure with hypoxia ([place])    Time spent: 25 minutes   Daiton Cowles. MD [facility] Pager [phone]. If 7PM-7AM, please contact night-coverage at [email or web address], password [facility] 04/21/2015, 6:25 AM  LOS: 2 days      By signing my name below, I, Rennis Harding, attest that this documentation has been prepared under the direction and in the presence of Kathie Dike, MD. Electronically signed: Rennis Harding, Scribe. 04/21/2015 10:10am   I, Dr. Kathie Dike, personally performed the  services described in this documentaiton. All medical record entries made by the scribe were at my direction and in my presence. I have reviewed the chart and agree that the record reflects my personal performance and is accurate and complete  Kathie Dike, MD, 04/21/2015 10:40 AM

## 2015-04-22 LAB — CBC
HEMATOCRIT: 36.2 % (ref 36.0–46.0)
Hemoglobin: 11.9 g/dL — ABNORMAL LOW (ref 12.0–15.0)
MCH: 31 pg (ref 26.0–34.0)
MCHC: 32.9 g/dL (ref 30.0–36.0)
MCV: 94.3 fL (ref 78.0–100.0)
Platelets: 111 10*3/uL — ABNORMAL LOW (ref 150–400)
RBC: 3.84 MIL/uL — ABNORMAL LOW (ref 3.87–5.11)
RDW: 19.3 % — AB (ref 11.5–15.5)
WBC: 3.8 10*3/uL — ABNORMAL LOW (ref 4.0–10.5)

## 2015-04-22 LAB — BASIC METABOLIC PANEL
Anion gap: 11 (ref 5–15)
CHLORIDE: 112 mmol/L — AB (ref 101–111)
CO2: 22 mmol/L (ref 22–32)
Calcium: 8.4 mg/dL — ABNORMAL LOW (ref 8.9–10.3)
Creatinine, Ser: <0.3 mg/dL — ABNORMAL LOW (ref 0.44–1.00)
GLUCOSE: 100 mg/dL — AB (ref 65–99)
POTASSIUM: 3.3 mmol/L — AB (ref 3.5–5.1)
Sodium: 145 mmol/L (ref 135–145)

## 2015-04-22 MED ORDER — ACETAMINOPHEN 325 MG PO TABS
650.0000 mg | ORAL_TABLET | Freq: Four times a day (QID) | ORAL | Status: DC | PRN
  Administered 2015-04-23 (×2): 650 mg via ORAL
  Filled 2015-04-22 (×2): qty 2

## 2015-04-22 MED ORDER — RESOURCE THICKENUP CLEAR PO POWD
ORAL | Status: DC | PRN
  Filled 2015-04-22: qty 125

## 2015-04-22 MED ORDER — POTASSIUM CHLORIDE 10 MEQ/100ML IV SOLN
10.0000 meq | INTRAVENOUS | Status: AC
  Administered 2015-04-22 (×6): 10 meq via INTRAVENOUS
  Filled 2015-04-22: qty 100

## 2015-04-22 NOTE — Progress Notes (Signed)
Present with patient, Haley Lowe, and her daughter Lynelle Smoke. Collie Siad was lethargic and was able to communicate minimally for a few minutes until she began to sleep.  Tammy shared about her mother's disease process especially in the last few months and how the current pneumonia has not dissipated as it has previously. She shared about her mother's strong will to live along with their discussions about how Collie Siad has been able to see her children grow into adulthood and now has grandchildren. We discussed how she and her sisters had gone through their father's death due to cancer when they were very young. We discussed their support systems. Family will continue to be present with Collie Siad in the hospital. Will continue to follow for support.

## 2015-04-22 NOTE — Progress Notes (Signed)
Speech Language Pathology Treatment: Dysphagia  Patient Details Name: Haley Lowe MRN: [id number] DOB: 05-02-1955 Today's Date: 04/22/2015 Time: 1040-1105 SLP Time Calculation (min) (ACUTE ONLY): 25 min  Assessment / Plan / Recommendation Clinical Impression  Mrs. Amy was seen while she was up in her chair this AM with husband present. RN reports that husband was trying to give pt thin water from a water bottle earlier today so SLP provided verbal education on the importance of aspiration precautions at this time. He verbalized understanding. Recommendations placed at head of bed. Pt still quite congested prior to PO administration. Oral care complete and mouth clean, but dry. Pt unable to initiate swallow without bolus so ice chip given. Swallow initiation likely mildly delayed and immediate loose coughing noted. Pt with congested cough after nearly every swallow, but able to then expectorate thick yellow tinged mucous. Oral suction used to assist, however with minimal return. Pt encouraged to continue attempts at clearing mucous and using oral suction prn. If patient not able to meet current nutritional needs via po, consider SHORT TERM NG placement. SLP will follow up again tomorrow to see if intake has increased, follow for diet tolerance and safety, consider repeat objective testing via MBSS tomorrow.    HPI HPI: 60 year old female with history of thyroid cancer, metastatic adenocarcinoma to brain, who is currently undergoing chemotherapy at [facility]. Today presents to the hospital with gradually worsening weakness and drowsiness since last night. Patient is very somnolent at this time and history obtained from patient's husband at bedside. As per patient's husband patient reported mild shortness of breath and worsening pain she has had a nonproductive cough for past 4 days. Patient is high dose morphine 60 mg twice a day and last dose was around 8 AM in the morning. Patient had chemotherapy in  November 2016 at that time she was treated for shingles and pneumonia. Pt was treated with Doxycycline on 03/29/15 twice a day for 10 days but CT scan still showed pneumonia 2 days ago so she was then placed back on the Doxycycline yesterday by her PCP. Patient is on home oxygen, she received radiation treatment for the brain metastasis and has upcoming MRI in 2 weeks. CT head done today showed no significant abnormality. In the ED chest x-ray showed bilateral basilar pneumonia. Patient started on vancomycin and Azactam per pharmacy. SLP asked to evaluate swallow.      SLP Plan  Continue with current plan of care     Recommendations  Diet recommendations: Dysphagia 1 (puree);Nectar-thick liquid Liquids provided via: Teaspoon;No straw Medication Administration: Crushed with puree Supervision: Staff to assist with self feeding;Full supervision/cueing for compensatory strategies;Trained caregiver to feed patient Compensations: Slow rate;Small sips/bites;Multiple dry swallows after each bite/sip;Clear throat intermittently Postural Changes and/or Swallow Maneuvers: Seated upright 90 degrees;Upright 30-60 min after meal             Oral Care Recommendations: Oral care BID;Oral care prior to ice chip/H20;Staff/trained caregiver to provide oral care Follow up Recommendations: 24 hour supervision/assistance Plan: Continue with current plan of care               Thank you,  Genene Churn, [place]       [place] 04/22/2015, 11:19 AM

## 2015-04-22 NOTE — Progress Notes (Signed)
[facility] PROGRESS NOTE  Haley Lowe FGH:[id number] DOB: 07-18-1955 DOA: 04/18/2015 PCP: [name], MD Summary  47 yow with PMH of Thyroid cancer, metastatic adenocarcinoma to the brain presented with complaints of worsening weakness and drowsiness. She has had surgery and radiation on neck resulting in dysphagia. She was found to have possible aspiration pneumonia and acute respiratory failure with hypoxia. She has been started on intravenous antibiotics and supplemental oxygen. Speech therapy is following for dysphagia. Overall prognosis is poor. Palliative care following. Family wishes to continue with full scope of care at this time.  Assessment/Plan: 1. Possible aspiration PNA. Daughter reports hx of dysphagia with swallow evaluation in the past. Clindamycin added for anaerobic coverage. Discontinue Aztreonam today. Continue pulmonary hygiene. Remains afebrile. PMT has spoken with the family and they are aware of the potential  recurrence of pneumonia and what this means for her quality of life. They remain hopeful and want to continue current treatments and maintain full code status.  2. Acute respiratory failure with hypoxia, related to PNA. Required Venti-mask overnight 1/15, yet was able to wean off and is now currently tolerating HFNC. Noted to require increased O2 requirements overnight. Was provided 15L HFNC and has since been weaned down to 5L. 3. Hypokalemia, improving. 4. Dysphagia, likely related to previous surgery on neck and previous radiation. Patient has had swallow evaluations in the past and was told that she had risk of aspiration. ST has evaluated and recommended Dysphagia 1(Puree); Nectar-thick liquid diet.  5. Dehydration, resolved. 6. 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 Previously completed chemotherapy and radiation therapy. Currently receiving oral chemo from [facility]. CT head negative. Has an MRI brain scheduled in 2 weeks. 7. History of thyroid  cancer. S/p resection and radiation therapy to neck 8. Hypertension, stable. Continue home medications.  9. Chronic pain syndrome, continue current management.  10. GERD, continue PPI.  11. Hypothyroidism, continue Synthroid.  Code Status: Full DVT prophylaxis: SCDs Family Communication: Family at bedside. Disposition Plan: Continue to monitor in stepdown    Consultants:  PMT   ST-Dysphagia 1(Puree); Nectar-thick liquid diet.  Procedures:  None   Antibiotics:  Vancomycin 1/13>>1/14  Aztreonam 1/13>>1/17  Clindamycin 1/15>>  HPI/Subjective: Feels breathing is fine. Admits productive cough. Husband bedside states that she was doing fine until she ate then she started having trouble breathing.   Objective: Filed Vitals:   04/22/15 0400 04/22/15 0500  BP: 126/83   Pulse: 106 111  Temp: 98 F (36.7 C)   Resp: 19     Intake/Output Summary (Last 24 hours) at 04/22/15 0642 Last data filed at 04/22/15 0500  Gross per 24 hour  Intake    100 ml  Output   3225 ml  Net  -3125 ml   Filed Weights   04/20/15 0500 04/21/15 0500 04/22/15 0500  Weight: 69.1 kg (152 lb 5.4 oz) 69.1 kg (152 lb 5.4 oz) 66 kg (145 lb 8.1 oz)    Exam: General: NAD, chronically ill appearing Cardiovascular: RRR, S1, S2  Respiratory: Rhonchi bilaterally Abdomen: soft, non tender, no distention , bowel sounds normal Musculoskeletal: No edema b/l  Data Reviewed: Basic Metabolic Panel:  Recent Labs Lab 04/18/15 2031 04/20/15 0427 04/21/15 0441 04/22/15 0407  NA 139 140 142 145  K 3.9 3.2* 3.4* 3.3*  CL 103 109 110 112*  CO2 '25 26 25 22  '$ GLUCOSE 109* 83 95 100*  BUN 13 <5* 6 <5*  CREATININE 1.22* 0.31* <0.30* <0.30*  CALCIUM 8.9 7.8* 7.9* 8.4*  Liver Function Tests:  Recent Labs Lab 04/18/15 2031  AST 40  ALT 48  ALKPHOS 94  BILITOT 0.8  PROT 6.7  ALBUMIN 3.7    CBC:  Recent Labs Lab 04/18/15 2031 04/20/15 0427 04/21/15 0441 04/22/15 0407  WBC 12.5* 5.4 4.3  3.8*  NEUTROABS 10.6*  --   --   --   HGB 13.9 10.3* 10.3* 11.9*  HCT 44.3 32.4* 31.7* 36.2  MCV 94.5 96.1 94.6 94.3  PLT 195 116* 101* 111*    BNP (last 3 results)  Recent Labs  03/15/15 2100 04/18/15 2031  BNP 81.0 42.0      Recent Results (from the past 240 hour(s))  Blood culture (routine x 2)     Status: None (Preliminary result)   Collection Time: 04/18/15  9:25 PM  Result Value Ref Range Status   Specimen Description BLOOD RIGHT ARM  Final   Special Requests BOTTLES DRAWN AEROBIC AND ANAEROBIC 4CC  Final   Culture NO GROWTH 3 DAYS  Final   Report Status PENDING  Incomplete  Urine culture     Status: None   Collection Time: 04/18/15 10:30 PM  Result Value Ref Range Status   Specimen Description URINE, CATHETERIZED  Final   Special Requests NONE  Final   Culture   Final    NO GROWTH 2 DAYS Performed at [facility]    Report Status 04/21/2015 FINAL  Final  Blood culture (routine x 2)     Status: None (Preliminary result)   Collection Time: 04/18/15 11:00 PM  Result Value Ref Range Status   Specimen Description BLOOD RIGHT HAND  Final   Special Requests BOTTLES DRAWN AEROBIC ONLY 2CC  Final   Culture NO GROWTH 3 DAYS  Final   Report Status PENDING  Incomplete  MRSA PCR Screening     Status: None   Collection Time: 04/19/15  1:50 AM  Result Value Ref Range Status   MRSA by PCR NEGATIVE NEGATIVE Final    Comment:        The GeneXpert MRSA Assay (FDA approved for NASAL specimens only), is one component of a comprehensive MRSA colonization surveillance program. It is not intended to diagnose MRSA infection nor to guide or monitor treatment for MRSA infections.      Studies: No results found.  Scheduled Meds: . amitriptyline  25 mg Oral QHS  . amLODipine  10 mg Oral Daily  . aztreonam  1 g Intravenous Q8H  . clindamycin (CLEOCIN) IV  900 mg Intravenous 3 times per day  . feeding supplement (ENSURE ENLIVE)  237 mL Oral BID BM  . guaiFENesin   1,200 mg Oral BID  . ipratropium-albuterol  3 mL Nebulization Q6H  . levothyroxine  137 mcg Oral QAC breakfast  . pantoprazole  40 mg Oral BID  . polyethylene glycol  17 g Oral QODAY  . sucralfate  1 g Oral TID WC & HS  . valACYclovir  1,000 mg Oral Daily   Continuous Infusions: . sodium chloride 100 mL/hr at 04/21/15 1147    Active Problems:   Metastatic adenocarcinoma to brain, thyroid cancer   HCAP (healthcare-associated pneumonia)   Pneumonia   Dehydration   Acute respiratory failure with hypoxia [facility])   Palliative care encounter   DNR (do not resuscitate) discussion    Time spent: 25 minutes   Jehanzeb Memon. MD [facility] Pager [phone]. If 7PM-7AM, please contact night-coverage at [email or web address], password [facility] 04/22/2015, 6:42 AM  LOS: 3 days  By signing my name below, I, Rennis Harding, attest that this documentation has been prepared under the direction and in the presence of Kathie Dike, MD. Electronically signed: Rennis Harding, Scribe. 04/22/2015 9:50am   I, Dr. Kathie Dike, personally performed the services described in this documentaiton. All medical record entries made by the scribe were at my direction and in my presence. I have reviewed the chart and agree that the record reflects my personal performance and is accurate and complete  Kathie Dike, MD, 04/22/2015 10:06 AM

## 2015-04-23 DIAGNOSIS — E876 Hypokalemia: Secondary | ICD-10-CM

## 2015-04-23 DIAGNOSIS — R1310 Dysphagia, unspecified: Secondary | ICD-10-CM

## 2015-04-23 DIAGNOSIS — E890 Postprocedural hypothyroidism: Secondary | ICD-10-CM

## 2015-04-23 DIAGNOSIS — J438 Other emphysema: Secondary | ICD-10-CM

## 2015-04-23 LAB — BASIC METABOLIC PANEL
ANION GAP: 9 (ref 5–15)
BUN: <5 mg/dL — ABNORMAL LOW (ref 6–20)
CALCIUM: 8.4 mg/dL — AB (ref 8.9–10.3)
CHLORIDE: 113 mmol/L — AB (ref 101–111)
CO2: 22 mmol/L (ref 22–32)
GLUCOSE: 99 mg/dL (ref 65–99)
Potassium: 3 mmol/L — ABNORMAL LOW (ref 3.5–5.1)
Sodium: 144 mmol/L (ref 135–145)

## 2015-04-23 LAB — CBC
HCT: 37.4 % (ref 36.0–46.0)
HEMOGLOBIN: 12 g/dL (ref 12.0–15.0)
MCH: 30.3 pg (ref 26.0–34.0)
MCHC: 32.1 g/dL (ref 30.0–36.0)
MCV: 94.4 fL (ref 78.0–100.0)
Platelets: 130 10*3/uL — ABNORMAL LOW (ref 150–400)
RBC: 3.96 MIL/uL (ref 3.87–5.11)
RDW: 19.8 % — ABNORMAL HIGH (ref 11.5–15.5)
WBC: 3.5 10*3/uL — ABNORMAL LOW (ref 4.0–10.5)

## 2015-04-23 LAB — CULTURE, BLOOD (ROUTINE X 2)
CULTURE: NO GROWTH
Culture: NO GROWTH

## 2015-04-23 LAB — MAGNESIUM: MAGNESIUM: 1.7 mg/dL (ref 1.7–2.4)

## 2015-04-23 MED ORDER — CEFUROXIME AXETIL 250 MG PO TABS: 500.0000 mg | ORAL_TABLET | Freq: Two times a day (BID) | ORAL | Status: DC

## 2015-04-23 MED ORDER — POTASSIUM CHLORIDE 10 MEQ/100ML IV SOLN
10.0000 meq | INTRAVENOUS | Status: AC
  Administered 2015-04-23 (×3): 10 meq via INTRAVENOUS
  Filled 2015-04-23: qty 100

## 2015-04-23 MED ORDER — HYDROMORPHONE HCL 1 MG/ML IJ SOLN
1.0000 mg | INTRAMUSCULAR | Status: DC | PRN
  Administered 2015-04-23 – 2015-04-24 (×2): 1 mg via INTRAVENOUS
  Filled 2015-04-23 (×3): qty 1

## 2015-04-23 MED ORDER — METRONIDAZOLE 500 MG PO TABS
500.0000 mg | ORAL_TABLET | Freq: Three times a day (TID) | ORAL | Status: DC
  Administered 2015-04-23 – 2015-04-25 (×7): 500 mg via ORAL
  Filled 2015-04-23 (×7): qty 1

## 2015-04-23 MED ORDER — MAGNESIUM SULFATE IN D5W 10-5 MG/ML-% IV SOLN
1.0000 g | Freq: Once | INTRAVENOUS | Status: AC
  Administered 2015-04-23: 1 g via INTRAVENOUS
  Filled 2015-04-23: qty 100

## 2015-04-23 MED ORDER — LEVOFLOXACIN 750 MG PO TABS
750.0000 mg | ORAL_TABLET | Freq: Every day | ORAL | Status: DC
  Administered 2015-04-23 – 2015-04-25 (×3): 750 mg via ORAL
  Filled 2015-04-23 (×3): qty 1

## 2015-04-23 MED ORDER — ENOXAPARIN SODIUM 30 MG/0.3ML ~~LOC~~ SOLN
30.0000 mg | SUBCUTANEOUS | Status: DC
  Administered 2015-04-23 – 2015-04-25 (×3): 30 mg via SUBCUTANEOUS
  Filled 2015-04-23 (×3): qty 0.3

## 2015-04-23 NOTE — Progress Notes (Addendum)
[facility] PROGRESS NOTE  Jerome Otter ZOX:[id number] DOB: [birth year]    PCP:   [name], MD   HPI: Haley Lowe is an 60 y.o. female with hx of thyroid adenocarcinoma with brain mets, s/p thyroidectomy, radiation and chemotherapy, HTN, anxiety, PE and DVT, admitted for aspiration PNA currently.  Patient was started on Van/Atreonam, but switch over to Clindamycin.  She has congestions, but able to speak and tolerating her Dysphagia I diet.    Rewiew of Systems:  Constitutional: Negative for malaise, fever and chills. No significant weight loss or weight gain Eyes: Negative for eye pain, redness and discharge, diplopia, visual changes, or flashes of light. ENMT: Negative for ear pain, hoarseness, nasal congestion, sinus pressure and sore throat. No headaches; tinnitus, drooling, or problem swallowing. Cardiovascular: Negative for chest pain, palpitations, diaphoresis, dyspnea and peripheral edema. ; No orthopnea, PND Respiratory: Negative for cough, hemoptysis, wheezing and stridor. No pleuritic chestpain. Gastrointestinal: Negative for nausea, vomiting, diarrhea, constipation, abdominal pain, melena, blood in stool, hematemesis, jaundice and rectal bleeding.    Genitourinary: Negative for frequency, dysuria, incontinence,flank pain and hematuria; Musculoskeletal: Negative for back pain and neck pain. Negative for swelling and trauma.;  Skin: . Negative for pruritus, rash, abrasions, bruising and skin lesion.; ulcerations Neuro: Negative for headache, lightheadedness and neck stiffness. Negative for weakness, altered level of consciousness , altered mental status, extremity weakness, burning feet, involuntary movement, seizure and syncope.  Psych: negative for anxiety, depression, insomnia, tearfulness, panic attacks, hallucinations, paranoia, suicidal or homicidal ideation    Past Medical History  Diagnosis Date  . Essential hypertension   . Anxiety   . Depression   .  COPD (chronic obstructive pulmonary disease) ([place])   . GERD (gastroesophageal reflux disease)   . Headache(784.0)   . Arthritis   . Hx of adenomatous colonic polyps   . Emphysema   . Cataract     1 lens implaqnt right eye,intact on left eye  . Goiter   . Fibromyalgia   . Pulmonary emboli ([place]) 11/04/11    Right upper lobe and r lower lobe PE  . Lung nodule seen on imaging study 11/04/11 CT    71m LLL  . DVT (deep venous thrombosis) ([facility] 12/09/2011  . [place]monary embolism ([facility] 12/09/2011  . Hyperlipidemia   . History of radiation therapy 10/06/11    [facility] 15Gy 181f brain  . UTI (lower urinary tract infection)   . Borderline diabetes mellitus   . Metastatic adenocarcinoma to brain ([facility]/[place]13    Left frontoparietal region  . Thyroid cancer ([facility]  . S/P radiation therapy 05/31/14 S[facility]    Brain  . PONV (postoperative nausea and vomiting)   . Controlled diabetes mellitus type II without complication ([facility]  Previously diagnosed with borderline diabetes.  . Marland Kitchenerpes simplex esophagitis 03/11/2015    Also candidal esophagitis  . HCAP (healthcare-associated pneumonia) 03/03/2015  . Mucositis due to radiation therapy 03/03/2015    Past Surgical History  Procedure Laterality Date  . Abdominal hysterectomy  1996  . Cholecystectomy  2000  . Shoulder surgery Right 1998  . Eye surgery  2001  . Foot surgery Left 2005  . Craniotomy  09/15/2011    Procedure: CRANIOTOMY TUMOR EXCISION;  Surgeon: RoHosie SpangleMD;  [facility];  Service: Neurosurgery;  Laterality: N/A;  Craniotomy resection of tumor with stealth  . Cataract extraction  2011    With lens implant  . Total thyroidectomy  10-29-2013    [facility]  [place]  . Lymph gland excision    . Video bronchoscopy with endobronchial ultrasound N/A 04/11/2014    Procedure: VIDEO BRONCHOSCOPY WITH ENDOBRONCHIAL ULTRASOUND;  Surgeon: Melrose Nakayama, MD;  Location: [place];  Service: Thoracic;   Laterality: N/A;  . Esophagogastroduodenoscopy (egd) with propofol N/A 03/07/2015    Procedure: ESOPHAGOGASTRODUODENOSCOPY (EGD) WITH PROPOFOL;  Surgeon: Daneil Dolin, MD;  [facility];  Service: Endoscopy;  Laterality: N/A;  . Esophageal biopsy  03/07/2015    Procedure: BIOPSY;  Surgeon: Daneil Dolin, MD;  [facility];  Service: Endoscopy;;    Medications:  HOME MEDS: Prior to Admission medications   Medication Sig Start Date End Date Taking? Authorizing Provider  ALPRAZolam Duanne Moron) 0.5 MG tablet Take 0.5 mg by mouth 3 (three) times daily as needed for anxiety.  01/04/12  Yes Historical Provider, MD  amitriptyline (ELAVIL) 50 MG tablet Take 50 mg by mouth at bedtime.   Yes Historical Provider, MD  amLODipine (NORVASC) 10 MG tablet Take 10 mg by mouth daily.   Yes Historical Provider, MD  doxycycline (VIBRAMYCIN) 100 MG capsule Take 100 mg by mouth 2 (two) times daily. Starting 04/17/2015 x 14 days.   Yes Historical Provider, MD  levothyroxine (SYNTHROID, LEVOTHROID) 175 MCG tablet Take 175 mcg by mouth daily before breakfast.   Yes Historical Provider, MD  magic mouthwash w/lidocaine SOLN Take 5 mLs by mouth 4 (four) times daily as needed for mouth pain. 03/13/15  Yes Velvet Bathe, MD  Magnesium 250 MG TABS Take 1 tablet by mouth daily.   Yes Historical Provider, MD  morphine (MS CONTIN) 60 MG 12 hr tablet Take 1 tablet (60 mg total) by mouth every 12 (twelve) hours. Use this medication with caution as it can cause confusion and can affect her breathing. 03/18/15  Yes Rexene Alberts, MD  ondansetron (ZOFRAN ODT) 8 MG disintegrating tablet Take 1 tablet (8 mg total) by mouth every 8 (eight) hours as needed for nausea or vomiting. 04/05/14  Yes Pattricia Boss, MD  Oxycodone HCl 20 MG TABS Take 1 tablet by mouth every 4 (four) hours as needed (pain).   Yes Historical Provider, MD  pantoprazole (PROTONIX) 40 MG tablet Take 1 tablet (40 mg total) by mouth 2 (two) times daily.  03/13/15  Yes Velvet Bathe, MD  polyethylene glycol (MIRALAX / GLYCOLAX) packet Take 17 g by mouth every other day. For constipation   Yes Historical Provider, MD  Simethicone (GAS-X EXTRA STRENGTH PO) Take 1 tablet by mouth daily as needed (gas relief).   Yes Historical Provider, MD  sucralfate (CARAFATE) 1 G tablet Take 1 tablet (1 g total) by mouth 4 (four) times daily -  with meals and at bedtime. 03/13/15  Yes Velvet Bathe, MD  valACYclovir (VALTREX) 500 MG tablet Take 500 mg by mouth daily.   Yes Historical Provider, MD  acyclovir (ZOVIRAX) 200 MG capsule Take 2 capsules (400 mg total) by mouth 3 (three) times daily. Continue through 03/23/15 or until completed. Patient not taking: Reported on 04/19/2015 03/18/15   Rexene Alberts, MD  cefUROXime (CEFTIN) 500 MG tablet Take 1 tablet (500 mg total) by mouth 2 (two) times daily with a meal. Antibiotic be taken as directed, starting tomorrow on 03/19/15. Patient not taking: Reported on 04/14/2015 03/18/15   Rexene Alberts, MD  fluconazole (DIFLUCAN) 100 MG tablet Take 1 tablet (100 mg total) by mouth daily. Continue through 03/23/15 or until completion. Patient not taking: Reported on 04/14/2015  03/18/15   Rexene Alberts, MD  glimepiride (AMARYL) 1 MG tablet Take a half a tablet daily as needed for a blood sugar greater 150 or greater. Patient not taking: Reported on 04/14/2015 03/18/15   Rexene Alberts, MD  guaiFENesin-dextromethorphan [facility] DM) 100-10 MG/5ML syrup Take 5 mLs by mouth every 4 (four) hours as needed for cough. Patient not taking: Reported on 04/14/2015 03/18/15   Rexene Alberts, MD  Wound Dressings [facility]) Apply 1 application topically daily.    Historical Provider, MD     Allergies:  Allergies  Allergen Reactions  . Penicillins Swelling    " my brain swelled, and mymouth" Has patient had a PCN reaction causing immediate rash, facial/tongue/throat swelling, SOB or lightheadedness with hypotension: No Has patient had a PCN  reaction causing severe rash involving mucus membranes or skin necrosis: No Has patient had a PCN reaction that required hospitalization No Has patient had a PCN reaction occurring within the last 10 years: No If all of the above answers are "NO", then may proceed with Cephalosporin use.   . Other Swelling    PT ALSO REACTS TO PAPER TAPE AND BAND-AIDS.  Marland Kitchen Codeine Itching    Mild takes benadryl  . Tape Rash    Social History:   reports that she quit smoking about 4 years ago. Her smoking use included Cigarettes. She has a 40 pack-year smoking history. She has never used smokeless tobacco. She reports that she does not drink alcohol or use illicit drugs.  Family History: Family History  Problem Relation Age of Onset  . Asthma Mother   . Kidney failure Father   . Diabetes Sister   . Heart attack Sister   . Colon cancer Brother   . COPD Sister   . Aneurysm Paternal Grandmother     Brain  . Parkinsonism Maternal Uncle   . COPD Brother      Physical Exam: Filed Vitals:   04/23/15 0500 04/23/15 0600 04/23/15 0734 04/23/15 0746  BP: 103/75 128/83    Pulse: 96 96    Temp:    97.8 F (36.6 C)  TempSrc:    Axillary  Resp: 27 14    Height:      Weight: 63.2 kg (139 lb 5.3 oz)     SpO2: 93% 96% 96%    Blood pressure 128/83, pulse 96, temperature 97.8 F (36.6 C), temperature source Axillary, resp. rate 14, height '5\' 4"'$  (1.626 m), weight 63.2 kg (139 lb 5.3 oz), SpO2 96 %.  GEN:  Pleasant patient lying in the stretcher in no acute distress; cooperative with exam. PSYCH:  alert and oriented x4; does not appear anxious or depressed; affect is appropriate. HEENT: Mucous membranes pink and anicteric; PERRLA; EOM intact; no cervical lymphadenopathy nor thyromegaly or carotid bruit; no JVD; There were no stridor. Neck is very supple. Breasts:: Not examined CHEST WALL: No tenderness CHEST: Normal respiration, clear to auscultation bilaterally.  HEART: Regular rate and rhythm.  There  are no murmur, rub, or gallops.   BACK: No kyphosis or scoliosis; no CVA tenderness ABDOMEN: soft and non-tender; no masses, no organomegaly, normal abdominal bowel sounds; no pannus; no intertriginous candida. There is no rebound and no distention. Rectal Exam: Not done EXTREMITIES: No bone or joint deformity; age-appropriate arthropathy of the hands and knees; no edema; no ulcerations.  There is no calf tenderness. Genitalia: not examined PULSES: 2+ and symmetric SKIN: Normal hydration no rash or ulceration CNS: Cranial nerves 2-12 grossly intact no focal lateralizing  neurologic deficit.  Speech is fluent; uvula elevated with phonation, facial symmetry and tongue midline. DTR are normal bilaterally, cerebella exam is intact, barbinski is negative and strengths are equaled bilaterally.  No sensory loss.   Labs on Admission:  Basic Metabolic Panel:  Recent Labs Lab 04/18/15 2031 04/20/15 0427 04/21/15 0441 04/22/15 0407 04/23/15 0444 04/23/15 0705  NA 139 140 142 145 144  --   K 3.9 3.2* 3.4* 3.3* 3.0*  --   CL 103 109 110 112* 113*  --   CO2 '25 26 25 22 22  '$ --   GLUCOSE 109* 83 95 100* 99  --   BUN 13 <5* 6 <5* <5*  --   CREATININE 1.22* 0.31* <0.30* <0.30* <0.30*  --   CALCIUM 8.9 7.8* 7.9* 8.4* 8.4*  --   MG  --   --   --   --   --  1.7   Liver Function Tests:  Recent Labs Lab 04/18/15 2031  AST 40  ALT 48  ALKPHOS 94  BILITOT 0.8  PROT 6.7  ALBUMIN 3.7    Recent Labs Lab 04/20/15 1524  AMMONIA 35   CBC:  Recent Labs Lab 04/18/15 2031 04/20/15 0427 04/21/15 0441 04/22/15 0407 04/23/15 0444  WBC 12.5* 5.4 4.3 3.8* 3.5*  NEUTROABS 10.6*  --   --   --   --   HGB 13.9 10.3* 10.3* 11.9* 12.0  HCT 44.3 32.4* 31.7* 36.2 37.4  MCV 94.5 96.1 94.6 94.3 94.4  PLT 195 116* 101* 111* 130*    EKG: Independently reviewed.   Assessment/Plan Present on Admission:  . Dehydration . Pneumonia . Metastatic adenocarcinoma to brain, thyroid cancer . Aspiration  pneumonia ([place]) . Acute respiratory failure with hypoxia ([place]) . COPD (chronic obstructive pulmonary disease) ([place]) . Hypothyroid . Hypokalemia . Thrombocytopenia ([facility])  PLAN: Assessment/Plan: 1. Possible aspiration PNA. Daughter reports hx of dysphagia with swallow evaluation in the past. Currently on Clindamycin alone. WIll change to Levaquin and Flagyl.  PMT has spoken with the family and they are aware of the potential recurrence of pneumonia and what this means for her quality of life. They remain hopeful and want to continue current treatments and maintain full code status.  2. Acute respiratory failure with hypoxia, related to PNA. Required Venti-mask overnight 1/15, yet was able to wean off and is now currently tolerating HFNC. Noted to require increased O2 requirements overnight. Was provided 15L HFNC and has since been weaned down to 5L. 3. Hypokalemia, improving. HypoMag:  Will supplement today.  4. Dysphagia, likely related to previous surgery on neck and previous radiation. Patient has had swallow evaluations in the past and was told that she had risk of aspiration. ST has evaluated and recommended Dysphagia 1(Puree); Nectar-thick liquid diet.  5. Dehydration, resolved. 6. 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 Previously completed chemotherapy and radiation therapy. Currently receiving oral chemo from [facility]. CT head negative. Has an MRI brain scheduled in 2 weeks. 7. History of thyroid cancer. S/p resection and radiation therapy to neck 8. Hypertension, stable. Continue home medications.  9. Chronic pain syndrome, continue current management.  10. GERD, continue PPI.  11. Hypothyroidism, continue Synthroid.  Code Status: Full DVT prophylaxis: Lovenox Family Communication: Family at bedside. Disposition Plan: Continue to monitor in stepdown   Harue Pribble, MD.  FACP [facility] Pager [phone] 7pm to 7am.  04/23/2015, 7:49 AM

## 2015-04-23 NOTE — Plan of Care (Signed)
Unable to meet with family today dt patient needs. Call to daughter [name] at [phone].  Scheduled meeting tomorrow to discuss goals (return to home).

## 2015-04-24 LAB — CULTURE, RESPIRATORY W GRAM STAIN: Culture: NORMAL

## 2015-04-24 LAB — CBC
HEMATOCRIT: 38.9 % (ref 36.0–46.0)
HEMOGLOBIN: 12.2 g/dL (ref 12.0–15.0)
MCH: 29.9 pg (ref 26.0–34.0)
MCHC: 31.4 g/dL (ref 30.0–36.0)
MCV: 95.3 fL (ref 78.0–100.0)
Platelets: 153 10*3/uL (ref 150–400)
RBC: 4.08 MIL/uL (ref 3.87–5.11)
RDW: 20.3 % — ABNORMAL HIGH (ref 11.5–15.5)
WBC: 4.5 10*3/uL (ref 4.0–10.5)

## 2015-04-24 LAB — MAGNESIUM: Magnesium: 1.7 mg/dL (ref 1.7–2.4)

## 2015-04-24 LAB — CULTURE, RESPIRATORY

## 2015-04-24 MED ORDER — DIPHENHYDRAMINE HCL 50 MG/ML IJ SOLN
12.5000 mg | Freq: Once | INTRAMUSCULAR | Status: AC
  Administered 2015-04-24: 12.5 mg via INTRAVENOUS
  Filled 2015-04-24: qty 1

## 2015-04-24 MED ORDER — HYDROMORPHONE HCL 1 MG/ML IJ SOLN
1.0000 mg | INTRAMUSCULAR | Status: DC | PRN
  Administered 2015-04-24 – 2015-04-25 (×5): 1 mg via INTRAVENOUS
  Filled 2015-04-24 (×5): qty 1

## 2015-04-24 MED ORDER — MAGNESIUM OXIDE 400 (241.3 MG) MG PO TABS
400.0000 mg | ORAL_TABLET | Freq: Every day | ORAL | Status: DC
  Administered 2015-04-24 – 2015-04-25 (×2): 400 mg via ORAL
  Filled 2015-04-24 (×2): qty 1

## 2015-04-24 MED ORDER — MAGNESIUM OXIDE 400 (241.3 MG) MG PO TABS
400.0000 mg | ORAL_TABLET | Freq: Every day | ORAL | Status: DC
Start: 2015-04-24 — End: 2015-04-24

## 2015-04-24 MED ORDER — ALUM & MAG HYDROXIDE-SIMETH 200-200-20 MG/5ML PO SUSP
30.0000 mL | Freq: Once | ORAL | Status: AC
  Administered 2015-04-24: 30 mL via ORAL
  Filled 2015-04-24: qty 30

## 2015-04-24 MED ORDER — MAGNESIUM CHLORIDE 64 MG PO TBEC
1.0000 | DELAYED_RELEASE_TABLET | Freq: Two times a day (BID) | ORAL | Status: DC
  Filled 2015-04-24 (×3): qty 1

## 2015-04-24 MED ORDER — POTASSIUM CHLORIDE 20 MEQ/15ML (10%) PO SOLN
40.0000 meq | Freq: Every day | ORAL | Status: DC
  Administered 2015-04-24 – 2015-04-25 (×2): 40 meq via ORAL
  Filled 2015-04-24 (×2): qty 30

## 2015-04-24 MED ORDER — POTASSIUM CHLORIDE CRYS ER 20 MEQ PO TBCR
40.0000 meq | EXTENDED_RELEASE_TABLET | Freq: Every day | ORAL | Status: DC
Start: 2015-04-24 — End: 2015-04-24

## 2015-04-24 MED ORDER — MAGNESIUM SULFATE 2 GM/50ML IV SOLN
2.0000 g | Freq: Once | INTRAVENOUS | Status: AC
  Administered 2015-04-24: 2 g via INTRAVENOUS
  Filled 2015-04-24: qty 50

## 2015-04-24 NOTE — Progress Notes (Signed)
[facility] PROGRESS NOTE  Haley Lowe NIO:[id number] DOB: [birth year]    PCP:   [name], MD   HPI:  Haley Lowe is an 60 y.o. female with hx of thyroid adenocarcinoma with brain mets, s/p thyroidectomy, radiation and chemotherapy, HTN, anxiety, PE and DVT, admitted for aspiration PNA currently. Patient was started on Van/Atreonam, but switched to now Levoquin and IV Flagyl. She has congestions, but able to speak and tolerating her Dysphagia I diet. She is doing better each day, and oxygen requirement has decreased.  Her electrolytes are off.    Rewiew of Systems:  Constitutional: Negative for malaise, fever and chills. No significant weight loss or weight gain Eyes: Negative for eye pain, redness and discharge, diplopia, visual changes, or flashes of light. ENMT: Negative for ear pain, hoarseness, nasal congestion, sinus pressure and sore throat. No headaches; tinnitus, drooling, or problem swallowing. Cardiovascular: Negative for chest pain, palpitations, diaphoresis, dyspnea and peripheral edema. ; No orthopnea, PND Respiratory: Negative for hemoptysis, wheezing and stridor. No pleuritic chestpain. Gastrointestinal: Negative for nausea, vomiting, diarrhea, constipation, abdominal pain, melena, blood in stool, hematemesis, jaundice and rectal bleeding.    Genitourinary: Negative for frequency, dysuria, incontinence,flank pain and hematuria; Musculoskeletal: Negative for back pain and neck pain. Negative for swelling and trauma.;  Skin: . Negative for pruritus, rash, abrasions, bruising and skin lesion.; ulcerations Neuro: Negative for headache, lightheadedness and neck stiffness. Negative for weakness, altered level of consciousness , altered mental status, extremity weakness, burning feet, involuntary movement, seizure and syncope.  Psych: negative for anxiety, depression, insomnia, tearfulness, panic attacks, hallucinations, paranoia, suicidal or homicidal ideation     Past Medical History  Diagnosis Date  . Essential hypertension   . Anxiety   . Depression   . COPD (chronic obstructive pulmonary disease) ([place])   . GERD (gastroesophageal reflux disease)   . Headache(784.0)   . Arthritis   . Hx of adenomatous colonic polyps   . Emphysema   . Cataract     1 lens implaqnt right eye,intact on left eye  . Goiter   . Fibromyalgia   . Pulmonary emboli ([place]) 11/04/11    Right upper lobe and r lower lobe PE  . Lung nodule seen on imaging study 11/04/11 CT    74m LLL  . DVT (deep venous thrombosis) ([facility] 12/09/2011  . Pulmonary embolism ([facility] 12/09/2011  . Hyperlipidemia   . History of radiation therapy 10/06/11    [facility] 15Gy 152f brain  . UTI (lower urinary tract infection)   . Borderline diabetes mellitus   . Metastatic adenocarcinoma to brain ([facility]/[place]13    Left frontoparietal region  . Thyroid cancer ([facility]  . S/P radiation therapy 05/31/14 [facility]    Brain  . PONV (postoperative nausea and vomiting)   . Controlled diabetes mellitus type II without complication ([id number]  Previously diagnosed with borderline diabetes.  . Marland Kitchenerpes simplex esophagitis 03/11/2015    Also candidal esophagitis  . HCAP (healthcare-associated pneumonia) 03/03/2015  . Mucositis due to radiation therapy 03/03/2015    Past Surgical History  Procedure Laterality Date  . Abdominal hysterectomy  1996  . Cholecystectomy  2000  . Shoulder surgery Right 1998  . Eye surgery  2001  . Foot surgery Left 2005  . Craniotomy  09/15/2011    Procedure: CRANIOTOMY TUMOR EXCISION;  Surgeon: RoHosie SpangleMD;  [facility];  Service: Neurosurgery;  Laterality: N/A;  Craniotomy resection of tumor with stealth  . Cataract extraction  2011    With lens implant  . Total thyroidectomy  10-29-2013    [facility]  . Lymph gland excision    . Video bronchoscopy with endobronchial ultrasound N/A 04/11/2014    Procedure: VIDEO BRONCHOSCOPY WITH  ENDOBRONCHIAL ULTRASOUND;  Surgeon: Melrose Nakayama, MD;  Location: [facility];  Service: Thoracic;  Laterality: N/A;  . Esophagogastroduodenoscopy (egd) with propofol N/A 03/07/2015    Procedure: ESOPHAGOGASTRODUODENOSCOPY (EGD) WITH PROPOFOL;  Surgeon: Daneil Dolin, MD;  [facility];  Service: Endoscopy;  Laterality: N/A;  . Esophageal biopsy  03/07/2015    Procedure: BIOPSY;  Surgeon: Daneil Dolin, MD;  [facility];  Service: Endoscopy;;    Medications:  HOME MEDS: Prior to Admission medications   Medication Sig Start Date End Date Taking? Authorizing Provider  ALPRAZolam Duanne Moron) 0.5 MG tablet Take 0.5 mg by mouth 3 (three) times daily as needed for anxiety.  01/04/12  Yes Historical Provider, MD  amitriptyline (ELAVIL) 50 MG tablet Take 50 mg by mouth at bedtime.   Yes Historical Provider, MD  amLODipine (NORVASC) 10 MG tablet Take 10 mg by mouth daily.   Yes Historical Provider, MD  doxycycline (VIBRAMYCIN) 100 MG capsule Take 100 mg by mouth 2 (two) times daily. Starting 04/17/2015 x 14 days.   Yes Historical Provider, MD  levothyroxine (SYNTHROID, LEVOTHROID) 175 MCG tablet Take 175 mcg by mouth daily before breakfast.   Yes Historical Provider, MD  magic mouthwash w/lidocaine SOLN Take 5 mLs by mouth 4 (four) times daily as needed for mouth pain. 03/13/15  Yes Velvet Bathe, MD  Magnesium 250 MG TABS Take 1 tablet by mouth daily.   Yes Historical Provider, MD  morphine (MS CONTIN) 60 MG 12 hr tablet Take 1 tablet (60 mg total) by mouth every 12 (twelve) hours. Use this medication with caution as it can cause confusion and can affect her breathing. 03/18/15  Yes Rexene Alberts, MD  ondansetron (ZOFRAN ODT) 8 MG disintegrating tablet Take 1 tablet (8 mg total) by mouth every 8 (eight) hours as needed for nausea or vomiting. 04/05/14  Yes Pattricia Boss, MD  Oxycodone HCl 20 MG TABS Take 1 tablet by mouth every 4 (four) hours as needed (pain).   Yes Historical Provider, MD   pantoprazole (PROTONIX) 40 MG tablet Take 1 tablet (40 mg total) by mouth 2 (two) times daily. 03/13/15  Yes Velvet Bathe, MD  polyethylene glycol (MIRALAX / GLYCOLAX) packet Take 17 g by mouth every other day. For constipation   Yes Historical Provider, MD  Simethicone (GAS-X EXTRA STRENGTH PO) Take 1 tablet by mouth daily as needed (gas relief).   Yes Historical Provider, MD  sucralfate (CARAFATE) 1 G tablet Take 1 tablet (1 g total) by mouth 4 (four) times daily -  with meals and at bedtime. 03/13/15  Yes Velvet Bathe, MD  valACYclovir (VALTREX) 500 MG tablet Take 500 mg by mouth daily.   Yes Historical Provider, MD  acyclovir (ZOVIRAX) 200 MG capsule Take 2 capsules (400 mg total) by mouth 3 (three) times daily. Continue through 03/23/15 or until completed. Patient not taking: Reported on 04/19/2015 03/18/15   Rexene Alberts, MD  cefUROXime (CEFTIN) 500 MG tablet Take 1 tablet (500 mg total) by mouth 2 (two) times daily with a meal. Antibiotic be taken as directed, starting tomorrow on 03/19/15. Patient not taking: Reported on 04/14/2015 03/18/15   Rexene Alberts, MD  fluconazole (DIFLUCAN) 100 MG tablet Take 1 tablet (100  mg total) by mouth daily. Continue through 03/23/15 or until completion. Patient not taking: Reported on 04/14/2015 03/18/15   Rexene Alberts, MD  glimepiride (AMARYL) 1 MG tablet Take a half a tablet daily as needed for a blood sugar greater 150 or greater. Patient not taking: Reported on 04/14/2015 03/18/15   Rexene Alberts, MD  guaiFENesin-dextromethorphan [facility] DM) 100-10 MG/5ML syrup Take 5 mLs by mouth every 4 (four) hours as needed for cough. Patient not taking: Reported on 04/14/2015 03/18/15   Rexene Alberts, MD  Wound Dressings [facility]) Apply 1 application topically daily.    Historical Provider, MD     Allergies:  Allergies  Allergen Reactions  . Penicillins Swelling    " my brain swelled, and mymouth" Has patient had a PCN reaction causing immediate rash,  facial/tongue/throat swelling, SOB or lightheadedness with hypotension: Yes Has patient had a PCN reaction causing severe rash involving mucus membranes or skin necrosis: No Has patient had a PCN reaction that required hospitalization No Has patient had a PCN reaction occurring within the last 10 years: No If all of the above answers are "NO", then may proceed with Cephalosporin use.   . Other Swelling    PT ALSO REACTS TO PAPER TAPE AND BAND-AIDS.  Marland Kitchen Codeine Itching    Mild takes benadryl  . Tape Rash    Social History:   reports that she quit smoking about 4 years ago. Her smoking use included Cigarettes. She has a 40 pack-year smoking history. She has never used smokeless tobacco. She reports that she does not drink alcohol or use illicit drugs.  Family History: Family History  Problem Relation Age of Onset  . Asthma Mother   . Kidney failure Father   . Diabetes Sister   . Heart attack Sister   . Colon cancer Brother   . COPD Sister   . Aneurysm Paternal Grandmother     Brain  . Parkinsonism Maternal Uncle   . COPD Brother      Physical Exam: Filed Vitals:   04/24/15 0500 04/24/15 0600 04/24/15 0722 04/24/15 0800  BP:      Pulse:  108 97   Temp:    97 F (36.1 C)  TempSrc:    Oral  Resp:  32 18   Height:      Weight: 63.4 kg (139 lb 12.4 oz)     SpO2:  96% 95%    Blood pressure 128/81, pulse 97, temperature 97 F (36.1 C), temperature source Oral, resp. rate 18, height '5\' 4"'$  (1.626 m), weight 63.4 kg (139 lb 12.4 oz), SpO2 95 %.  GEN:  Pleasant  patient lying in the stretcher in no acute distress; cooperative with exam. PSYCH:  alert and oriented x4; does not appear anxious or depressed; affect is appropriate. HEENT: Mucous membranes pink and anicteric; PERRLA; EOM intact; no cervical lymphadenopathy nor thyromegaly or carotid bruit; no JVD; There were no stridor. Neck is very supple. Breasts:: Not examined CHEST WALL: No tenderness CHEST: Normal respiration,  clear to auscultation bilaterally.  HEART: Regular rate and rhythm.  There are no murmur, rub, or gallops.   BACK: No kyphosis or scoliosis; no CVA tenderness ABDOMEN: soft and non-tender; no masses, no organomegaly, normal abdominal bowel sounds; no pannus; no intertriginous candida. There is no rebound and no distention. Rectal Exam: Not done EXTREMITIES: No bone or joint deformity; age-appropriate arthropathy of the hands and knees; no edema; no ulcerations.  There is no calf tenderness. Genitalia: not examined PULSES: 2+  and symmetric SKIN: Normal hydration no rash or ulceration CNS: Cranial nerves 2-12 grossly intact no focal lateralizing neurologic deficit.  Speech is fluent; uvula elevated with phonation, facial symmetry and tongue midline. DTR are normal bilaterally, cerebella exam is intact, barbinski is negative and strengths are equaled bilaterally.  No sensory loss.   Labs on Admission:  Basic Metabolic Panel:  Recent Labs Lab 04/18/15 2031 04/20/15 0427 04/21/15 0441 04/22/15 0407 04/23/15 0444 04/23/15 0705 04/24/15 0502  NA 139 140 142 145 144  --   --   K 3.9 3.2* 3.4* 3.3* 3.0*  --   --   CL 103 109 110 112* 113*  --   --   CO2 '25 26 25 22 22  '$ --   --   GLUCOSE 109* 83 95 100* 99  --   --   BUN 13 <5* 6 <5* <5*  --   --   CREATININE 1.22* 0.31* <0.30* <0.30* <0.30*  --   --   CALCIUM 8.9 7.8* 7.9* 8.4* 8.4*  --   --   MG  --   --   --   --   --  1.7 1.7   Liver Function Tests:  Recent Labs Lab 04/18/15 2031  AST 40  ALT 48  ALKPHOS 94  BILITOT 0.8  PROT 6.7  ALBUMIN 3.7    Recent Labs Lab 04/20/15 1524  AMMONIA 35   CBC:  Recent Labs Lab 04/18/15 2031 04/20/15 0427 04/21/15 0441 04/22/15 0407 04/23/15 0444 04/24/15 0502  WBC 12.5* 5.4 4.3 3.8* 3.5* 4.5  NEUTROABS 10.6*  --   --   --   --   --   HGB 13.9 10.3* 10.3* 11.9* 12.0 12.2  HCT 44.3 32.4* 31.7* 36.2 37.4 38.9  MCV 94.5 96.1 94.6 94.3 94.4 95.3  PLT 195 116* 101* 111* 130* 153    Assessment/Plan Present on Admission:  . Dehydration . Pneumonia . Metastatic adenocarcinoma to brain, thyroid cancer . Aspiration pneumonia ([place]) . Acute respiratory failure with hypoxia ([place]) . COPD (chronic obstructive pulmonary disease) ([place]) . Hypothyroid . Hypokalemia . Thrombocytopenia ([place])  PLAN: 1. Possible aspiration PNA. Daughter reports hx of dysphagia with swallow evaluation in the past. Currently on Levaquin and IV Flagyl. PMT has spoken with the family and they are aware of the potential recurrence of pneumonia and what this means for her quality of life. They remain hopeful and want to continue current treatments and maintain full code status.  2. Acute respiratory failure with hypoxia, related to PNA. Required Venti-mask overnight 1/15, yet was able to wean off and is now currently tolerating 3L [place].  Will continue with physical therapy.  3. Hypokalemia: Continue liquid KCL supplement. 4.  HypoMag: Will supplement today again with oral and IV.  5. Dysphagia, likely related to previous surgery on neck and previous radiation. Patient has had swallow evaluations in the past and was told that she had risk of aspiration. ST has evaluated and recommended Dysphagia 1(Puree); Nectar-thick liquid diet.  6. Dehydration, resolved. 7. 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 Previously completed chemotherapy and radiation therapy. Currently receiving oral chemo from [facility]. CT head negative. Has an MRI brain scheduled in 2 weeks. 8. History of thyroid cancer. S/p resection and radiation therapy to neck 9. Hypertension, stable. Continue home medications.  10. Chronic pain syndrome, continue current management. Appears balance with pain control and side effects.  11. GERD, continue PPI.  12. Hypothyroidism, continue Synthroid.   Other plans as  per orders. Code Status:FULL CODE.    Orvan Falconer, MD.  FACP [facility] Pager [phone] 7pm to 7am.  04/24/2015,  8:25 AM

## 2015-04-24 NOTE — Progress Notes (Signed)
Gave report to RN on 300 who will be getting patient. Will take to room by wheel chair.

## 2015-04-25 LAB — BASIC METABOLIC PANEL
ANION GAP: 9 (ref 5–15)
BUN: <5 mg/dL — ABNORMAL LOW (ref 6–20)
CALCIUM: 8.4 mg/dL — AB (ref 8.9–10.3)
CHLORIDE: 116 mmol/L — AB (ref 101–111)
CO2: 20 mmol/L — AB (ref 22–32)
Creatinine, Ser: 0.4 mg/dL — ABNORMAL LOW (ref 0.44–1.00)
GFR calc non Af Amer: >60 mL/min (ref >60)
Glucose, Bld: 97 mg/dL (ref 65–99)
POTASSIUM: 2.9 mmol/L — AB (ref 3.5–5.1)
Sodium: 145 mmol/L (ref 135–145)

## 2015-04-25 LAB — CBC
HCT: 39.1 % (ref 36.0–46.0)
HEMOGLOBIN: 12.3 g/dL (ref 12.0–15.0)
MCH: 30.3 pg (ref 26.0–34.0)
MCHC: 31.5 g/dL (ref 30.0–36.0)
MCV: 96.3 fL (ref 78.0–100.0)
PLATELETS: 181 10*3/uL (ref 150–400)
RBC: 4.06 MIL/uL (ref 3.87–5.11)
RDW: 20.7 % — ABNORMAL HIGH (ref 11.5–15.5)
WBC: 7.8 10*3/uL (ref 4.0–10.5)

## 2015-04-25 MED ORDER — BISACODYL 10 MG RE SUPP: 10.0000 mg | Freq: Every day | RECTAL | Status: DC | PRN

## 2015-04-25 MED ORDER — POTASSIUM CHLORIDE 20 MEQ/15ML (10%) PO SOLN: 40.0000 meq | Freq: Every day | ORAL | Status: DC

## 2015-04-25 MED ORDER — ALBUTEROL SULFATE (2.5 MG/3ML) 0.083% IN NEBU: 2.5000 mg | INHALATION_SOLUTION | RESPIRATORY_TRACT | Status: AC | PRN

## 2015-04-25 MED ORDER — ENSURE ENLIVE PO LIQD: 237.0000 mL | Freq: Two times a day (BID) | ORAL | Status: DC

## 2015-04-25 MED ORDER — AMITRIPTYLINE HCL 25 MG PO TABS: 25.0000 mg | ORAL_TABLET | Freq: Every day | ORAL | Status: DC

## 2015-04-25 MED ORDER — LEVOFLOXACIN 750 MG PO TABS: 750.0000 mg | ORAL_TABLET | Freq: Every day | ORAL | Status: DC

## 2015-04-25 MED ORDER — ONDANSETRON HCL 4 MG/2ML IJ SOLN: 4.0000 mg | Freq: Once | INTRAMUSCULAR | Status: DC

## 2015-04-25 MED ORDER — METRONIDAZOLE 500 MG PO TABS: 500.0000 mg | ORAL_TABLET | Freq: Three times a day (TID) | ORAL | Status: DC

## 2015-04-25 MED ORDER — VALACYCLOVIR HCL 1 G PO TABS: 1000.0000 mg | ORAL_TABLET | Freq: Every day | ORAL | Status: DC

## 2015-04-25 MED ORDER — ONDANSETRON HCL 4 MG/2ML IJ SOLN
INTRAMUSCULAR | Status: AC
  Administered 2015-04-25: 4 mg
  Filled 2015-04-25: qty 2

## 2015-04-25 NOTE — Discharge Summary (Signed)
Physician Discharge Summary  Haley Lowe DJM:[id number] DOB: 09-07-1955 DOA: 04/18/2015  PCP: [name], MD  Admit date: 04/18/2015 Discharge date: 04/25/2015  Time spent: 35 minutes  Recommendations for Outpatient Follow-up:  Follow up with PCP in one week. Follow up with oncology as scheduled.    Discharge Diagnoses:  Active Problems:   Metastatic adenocarcinoma to brain, thyroid cancer   Aspiration pneumonia (HCC)   COPD (chronic obstructive pulmonary disease) (HCC)   Pneumonia   Hypokalemia   Thrombocytopenia (HCC)   Hypothyroid   Dehydration   Dysphagia   Acute respiratory failure with hypoxia [facility])   Palliative care encounter   DNR (do not resuscitate) discussion   Discharge Condition:  Marked improvement.  Able to sit up and eat.   Diet recommendation:   Diet recommendations: Dysphagia 2 (fine chop);Thin liquid Liquids provided via: Teaspoon;No straw Medication Administration: Crushed with puree Supervision: Staff to assist with self feeding;Full supervision/cueing for compensatory strategies;Trained caregiver to feed patient Compensations: Slow rate;Small sips/bites;Multiple dry swallows after each bite/sip;Clear throat intermittently Postural Changes and/or Swallow Maneuvers: Seated upright 90 degrees;Upright 30-60 min after meal           Filed Weights   04/22/15 0500 04/23/15 0500 04/24/15 0500  Weight: 66 kg (145 lb 8.1 oz) 63.2 kg (139 lb 5.3 oz) 63.4 kg (139 lb 12.4 oz)    History of present illness: Patient was admitted into the hospital with fever and cough, by Dr Darrick Meigs on Apr 19, 2015.  Per his H and P:  " 60 year old female with history of thyroid cancer, metastatic adenocarcinoma to brain, who is currently undergoing chemotherapy at [facility]. Today presents to the hospital with gradually worsening weakness and drowsiness since last night. Patient is very somnolent at this time and history obtained from patient's husband at bedside. As per  patient's husband patient reported mild shortness of breath and worsening pain she has had a nonproductive cough for past 4 days. Patient is high dose morphine 60 mg twice a day and last dose was around 8 AM in the morning. Patient had chemotherapy in November 2016 at that time she was treated for shingles and pneumonia. Pt was treated with Doxycycline on 03/29/15 twice a day for 10 days but CT scan still showed pneumonia 2 days ago so she was then placed back on the Doxycycline yesterday by her PCP. Patient is on home oxygen, she received radiation treatment for the brain metastasis and has upcoming MRI in 2 weeks. CT head done today showed no significant abnormality.  In the ED chest x-ray showed bilateral basilar pneumonia. Patient started on vancomycin and Azactam per pharmacy   Hospital Course: Haley Lowe is an 60 y.o. female with hx of thyroid adenocarcinoma with brain mets, s/p thyroidectomy, radiation and chemotherapy, HTN, anxiety, PE and DVT, admitted for aspiration PNA and was in the ICU.  Patient was started on Van/Atreonam, but switched to now Levoquin and IV Flagyl. She has congestions, but able to speak and tolerating her Dysphagia I diet. She was doing better each day, and oxygen requirement has decreased. Her electrolytes are off as she was having low K and low Mag, and they have been supplemented.  She was re evaluated by speech, and recommendations were followed, and included above.  She was seen by PMT, and quality of life was discussed.  After discussion, family wanted to continue full treatment scope, including full code. She improved slowly, and went from HFNC requirement to about 3L [place].  Her sedative  medications including pain medications were cut back, and he became more alert and breathing better.  She was then moved to telemetry and was observed.  She did not have any tachycardia nor hypoxia, and felt well enough to be discharged home today.  She will follow up with her  oncologist and her PCP next week.  She will be at risk for aspiration, and she will use extreme caution with her eating as recommneded by speech.  For her hypothryoidism, her meds were continued.  Thank you and Good Day.    Discharge Exam: Filed Vitals:   04/24/15 2107 04/25/15 0455  BP: 117/79 132/94  Pulse: 106 103  Temp: 98.3 F (36.8 C) 98.4 F (36.9 C)  Resp: 21 20   Discharge Instructions   Discharge Instructions    Diet - low sodium heart healthy    Complete by:  As directed      Increase activity slowly    Complete by:  As directed           Current Discharge Medication List    START taking these medications   Details  albuterol (PROVENTIL) (2.5 MG/3ML) 0.083% nebulizer solution Take 3 mLs (2.5 mg total) by nebulization every 4 (four) hours as needed for wheezing or shortness of breath. Qty: 75 mL, Refills: 12    bisacodyl (DULCOLAX) 10 MG suppository Place 1 suppository (10 mg total) rectally daily as needed for moderate constipation. Qty: 12 suppository, Refills: 0    feeding supplement, ENSURE ENLIVE, (ENSURE ENLIVE) LIQD Take 237 mLs by mouth 2 (two) times daily between meals. Qty: 237 mL, Refills: 12    levofloxacin (LEVAQUIN) 750 MG tablet Take 1 tablet (750 mg total) by mouth daily. Qty: 5 tablet, Refills: 0    metroNIDAZOLE (FLAGYL) 500 MG tablet Take 1 tablet (500 mg total) by mouth 3 (three) times daily. Qty: 15 tablet, Refills: 0    potassium chloride 20 MEQ/15ML (10%) SOLN Take 30 mLs (40 mEq total) by mouth daily. Qty: 900 mL, Refills: 0      CONTINUE these medications which have CHANGED   Details  amitriptyline (ELAVIL) 25 MG tablet Take 1 tablet (25 mg total) by mouth at bedtime. Qty: 30 tablet, Refills: 1    valACYclovir (VALTREX) 1000 MG tablet Take 1 tablet (1,000 mg total) by mouth daily. Qty: 10 tablet, Refills: 0      CONTINUE these medications which have NOT CHANGED   Details  ALPRAZolam (XANAX) 0.5 MG tablet Take 0.5 mg by mouth  3 (three) times daily as needed for anxiety.     amLODipine (NORVASC) 10 MG tablet Take 10 mg by mouth daily.    levothyroxine (SYNTHROID, LEVOTHROID) 175 MCG tablet Take 175 mcg by mouth daily before breakfast.    magic mouthwash w/lidocaine SOLN Take 5 mLs by mouth 4 (four) times daily as needed for mouth pain. Qty: 400 mL, Refills: 0    Magnesium 250 MG TABS Take 1 tablet by mouth daily.    ondansetron (ZOFRAN ODT) 8 MG disintegrating tablet Take 1 tablet (8 mg total) by mouth every 8 (eight) hours as needed for nausea or vomiting. Qty: 20 tablet, Refills: 0    Oxycodone HCl 20 MG TABS Take 1 tablet by mouth every 4 (four) hours as needed (pain).    pantoprazole (PROTONIX) 40 MG tablet Take 1 tablet (40 mg total) by mouth 2 (two) times daily. Qty: 60 tablet, Refills: 0    polyethylene glycol (MIRALAX / GLYCOLAX) packet Take 17 g  by mouth every other day. For constipation    sucralfate (CARAFATE) 1 G tablet Take 1 tablet (1 g total) by mouth 4 (four) times daily -  with meals and at bedtime. Qty: 60 tablet, Refills: 0    Wound Dressings (SONAFINE) Apply 1 application topically daily.      STOP taking these medications     doxycycline (VIBRAMYCIN) 100 MG capsule      morphine (MS CONTIN) 60 MG 12 hr tablet      Simethicone (GAS-X EXTRA STRENGTH PO)      acyclovir (ZOVIRAX) 200 MG capsule      cefUROXime (CEFTIN) 500 MG tablet      fluconazole (DIFLUCAN) 100 MG tablet      glimepiride (AMARYL) 1 MG tablet      guaiFENesin-dextromethorphan (ROBITUSSIN DM) 100-10 MG/5ML syrup        Allergies  Allergen Reactions  . Penicillins Swelling    " my brain swelled, and mymouth" Has patient had a PCN reaction causing immediate rash, facial/tongue/throat swelling, SOB or lightheadedness with hypotension: Yes Has patient had a PCN reaction causing severe rash involving mucus membranes or skin necrosis: No Has patient had a PCN reaction that required hospitalization No Has  patient had a PCN reaction occurring within the last 10 years: No If all of the above answers are "NO", then may proceed with Cephalosporin use.   . Other Swelling    PT ALSO REACTS TO PAPER TAPE AND BAND-AIDS.  Marland Kitchen Codeine Itching    Mild takes benadryl  . Tape Rash      The results of significant diagnostics from this hospitalization (including imaging, microbiology, ancillary and laboratory) are listed below for reference.    Significant Diagnostic Studies: Dg Chest 2 View  03/27/2015  CLINICAL DATA:  Persistent cough, fever, congestion, follow-up pneumonia EXAM: CHEST  2 VIEW COMPARISON:  03/15/2015 FINDINGS: Cardiomediastinal silhouette is stable. Persistent streaky infiltrate in lingula and left base suspicious for atypical infection/pneumonia. Persistent right base medially atelectasis or infiltrate. No pulmonary edema. IMPRESSION: Persistent streaky infiltrate in lingula and left base suspicious for atypical infection/pneumonia. Persistent right base medially atelectasis or infiltrate. No pulmonary edema. Electronically Signed   By: Lahoma Crocker M.D.   On: 03/27/2015 13:47   Ct Head W Wo Contrast  04/19/2015  CLINICAL DATA:  Altered mental status, history of brain metastasis. Status post LEFT frontal craniotomy. History of metastatic thyroid cancer, borderline diabetes, hypertension. EXAM: CT HEAD WITHOUT AND WITH CONTRAST TECHNIQUE: Contiguous axial images were obtained from the base of the skull through the vertex without and with intravenous contrast CONTRAST:  173m OMNIPAQUE IOHEXOL 300 MG/ML  SOLN COMPARISON:  CT head March 15, 2015 and MRI of the head January 23, 2015 FINDINGS: No enhancing intracranial masses. 7 mm hypodensity in LEFT cerebellum likely represents patient's known metastasis, 15 mm previously. LEFT frontal encephalomalacia was present on prior imaging. No midline shift or mass effect. Ventricles and sulci are normal for age. No abnormal extra-axial fluid collections  or extra-axial enhancement. Mild calcific atherosclerosis of the carotid siphons. Old LEFT frontal craniotomy. No skull fracture. Paranasal sinuses and mastoid air cells are well aerated. Ocular globes and orbital contents are normal. Status post RIGHT ocular lens implant. IMPRESSION: No acute intracranial process. No enhancing intracranial masses though, MRI of the brain with contrast would be more sensitive for assessment of residual or new metastasis. 7 mm hypodensity LEFT cerebellum likely representing interval response to treatment of known metastasis. LEFT frontal craniotomy and subjacent  encephalomalacia. Electronically Signed   By: Elon Alas M.D.   On: 04/19/2015 00:42   Dg Chest Portable 1 View  04/18/2015  CLINICAL DATA:  Fever and shortness of breath for 4 days. Pneumonia. Currently undergoing chemotherapy for brain cancer. EXAM: PORTABLE CHEST 1 VIEW COMPARISON:  04/02/2015 FINDINGS: Mild worsening of bibasilar airspace disease is seen since prior study. Mild cardiomegaly stable. No evidence of pneumothorax or pleural effusion. IMPRESSION: Mild worsening of bibasilar airspace disease, suspicious for pneumonia. Electronically Signed   By: Earle Gell M.D.   On: 04/18/2015 21:08    Microbiology: Recent Results (from the past 240 hour(s))  Blood culture (routine x 2)     Status: None   Collection Time: 04/18/15  9:25 PM  Result Value Ref Range Status   Specimen Description BLOOD RIGHT ARM  Final   Special Requests BOTTLES DRAWN AEROBIC AND ANAEROBIC 4CC  Final   Culture NO GROWTH 5 DAYS  Final   Report Status 04/23/2015 FINAL  Final  Urine culture     Status: None   Collection Time: 04/18/15 10:30 PM  Result Value Ref Range Status   Specimen Description URINE, CATHETERIZED  Final   Special Requests NONE  Final   Culture   Final    NO GROWTH 2 DAYS Performed at [facility]    Report Status 04/21/2015 FINAL  Final  Blood culture (routine x 2)     Status: None    Collection Time: 04/18/15 11:00 PM  Result Value Ref Range Status   Specimen Description BLOOD RIGHT HAND  Final   Special Requests BOTTLES DRAWN AEROBIC ONLY 2CC  Final   Culture NO GROWTH 5 DAYS  Final   Report Status 04/23/2015 FINAL  Final  MRSA PCR Screening     Status: None   Collection Time: 04/19/15  1:50 AM  Result Value Ref Range Status   MRSA by PCR NEGATIVE NEGATIVE Final    Comment:        The GeneXpert MRSA Assay (FDA approved for NASAL specimens only), is one component of a comprehensive MRSA colonization surveillance program. It is not intended to diagnose MRSA infection nor to guide or monitor treatment for MRSA infections.   Culture, respiratory (NON-Expectorated)     Status: None   Collection Time: 04/20/15 10:20 AM  Result Value Ref Range Status   Specimen Description SPUTUM  Final   Special Requests NONE  Final   Gram Stain   Final    ABUNDANT WBC PRESENT,BOTH PMN AND MONONUCLEAR FEW SQUAMOUS EPITHELIAL CELLS PRESENT MODERATE GRAM POSITIVE COCCI IN PAIRS IN CLUSTERS MODERATE GRAM POSITIVE RODS Performed at [facility]    Culture   Final    NORMAL OROPHARYNGEAL FLORA Performed at [facility]    Report Status 04/24/2015 FINAL  Final     Labs: Basic Metabolic Panel:  Recent Labs Lab 04/20/15 0427 04/21/15 0441 04/22/15 0407 04/23/15 0444 04/23/15 0705 04/24/15 0502 04/25/15 0541  NA 140 142 145 144  --   --  145  K 3.2* 3.4* 3.3* 3.0*  --   --  2.9*  CL 109 110 112* 113*  --   --  116*  CO2 '26 25 22 22  '$ --   --  20*  GLUCOSE 83 95 100* 99  --   --  97  BUN <5* 6 <5* <5*  --   --  <5*  CREATININE 0.31* <0.30* <0.30* <0.30*  --   --  0.40*  CALCIUM 7.8* 7.9* 8.4* 8.4*  --   --  8.4*  MG  --   --   --   --  1.7 1.7  --    Liver Function Tests:  Recent Labs Lab 04/18/15 2031  AST 40  ALT 48  ALKPHOS 94  BILITOT 0.8  PROT 6.7  ALBUMIN 3.7    Recent Labs Lab 04/20/15 1524  AMMONIA 35   CBC:  Recent  Labs Lab 04/18/15 2031  04/21/15 0441 04/22/15 0407 04/23/15 0444 04/24/15 0502 04/25/15 0541  WBC 12.5*  < > 4.3 3.8* 3.5* 4.5 7.8  NEUTROABS 10.6*  --   --   --   --   --   --   HGB 13.9  < > 10.3* 11.9* 12.0 12.2 12.3  HCT 44.3  < > 31.7* 36.2 37.4 38.9 39.1  MCV 94.5  < > 94.6 94.3 94.4 95.3 96.3  PLT 195  < > 101* 111* 130* 153 181  < > = values in this interval not displayed.  SignedOrvan Falconer MD.  [facility] 04/25/2015, 1:57 PM

## 2015-04-25 NOTE — Care Management Note (Signed)
Case Management Note  Patient Details  Name: Haley Lowe MRN: [id number] Date of Birth: 05/30/1955  Subjective/Objective:                  Pt is from home, lives with husband and requires minimal assistance with ADL's. Pt has no HH services prior to admission. Pt has home O2 through [facility]. Pt plans to return home with self care today. Husband is at bedside.    Action/Plan: No CM needs.   Expected Discharge Date:     04/25/2015             Expected Discharge Plan:  Home/Self Care  In-House Referral:  NA  Discharge planning Services  CM Consult  Post Acute Care Choice:  NA Choice offered to:  NA  DME Arranged:    DME Agency:     HH Arranged:    HH Agency:     Status of Service:  Completed, signed off  Medicare Important Message Given:  Yes Date Medicare IM Given:    Medicare IM give by:    Date Additional Medicare IM Given:    Additional Medicare Important Message give by:     If discussed at Advance of Stay Meetings, dates discussed:    Additional Comments:  Sherald Barge, RN 04/25/2015, 2:10 PM

## 2015-04-25 NOTE — Care Management Important Message (Signed)
Important Message  Patient Details  Name: Haley Lowe MRN: [id number] Date of Birth: 1956-03-22   Medicare Important Message Given:  Yes    Sherald Barge, RN 04/25/2015, 2:10 PM

## 2015-04-25 NOTE — Progress Notes (Signed)
Patient alert and oriented, independent, VSS, pt. Tolerating diet well. No complaints of pain or nausea. Pt. Had IV removed tip intact. Pt. Had prescriptions given. Pt. Voiced understanding of discharge instructions with no further questions. Pt. Discharged via wheelchair with nurse tech.

## 2015-04-25 NOTE — Progress Notes (Signed)
Speech Language Pathology Treatment: Dysphagia  Patient Details Name: Haley Lowe MRN: [id number] DOB: [birth year] Today's Date: 04/25/2015 Time: 7510-2585 SLP Time Calculation (min) (ACUTE ONLY): 25 min  Assessment / Plan / Recommendation Clinical Impression  The pt was seen to provide discharge recommendations and education regarding least restrictive diet. The pt reports physician recommended fine chop (D2) diet (d/t pt reporting, "I can't eat that puree'") and patient was educated of the benefit of pulsing food in blender to achieve fine chop to decrease fatigue with prolonged mastication of mech soft/regular textures. The SLP also recommended Mare Ferrari free water protocol allowing pt to drink thin water administered by tsp between meals and 30 minutes after meals following thorough oral care. Research shows this protocol decreases the risk of dehydration and is preventative care for UTIs. Pt was provided written education of this protocol. Recommended and reviewed strategies recommended of occasional throat clear, additional "hard" and dry swallow after bites/sips and to alternate chopped/puree bites with sips of NTL. Recommend D2(chopped)/ Nectar thick liquids.    HPI HPI: 60 year old female with history of thyroid cancer, metastatic adenocarcinoma to brain, who is currently undergoing chemotherapy at [facility]. Today presents to the hospital with gradually worsening weakness and drowsiness since last night. Patient is very somnolent at this time and history obtained from patient's husband at bedside. As per patient's husband patient reported mild shortness of breath and worsening pain she has had a nonproductive cough for past 4 days. Patient is high dose morphine 60 mg twice a day and last dose was around 8 AM in the morning. Patient had chemotherapy in November 2016 at that time she was treated for shingles and pneumonia. Pt was treated with Doxycycline on 03/29/15 twice a day for 10 days but CT  scan still showed pneumonia 2 days ago so she was then placed back on the Doxycycline yesterday by her PCP. Patient is on home oxygen, she received radiation treatment for the brain metastasis and has upcoming MRI in 2 weeks. CT head done today showed no significant abnormality. In the ED chest x-ray showed bilateral basilar pneumonia. Patient started on vancomycin and Azactam per pharmacy. SLP asked to evaluate swallow.      SLP Plan  Continue with current plan of care     Recommendations  Diet recommendations: Dysphagia 2 (fine chop);Thin liquid Liquids provided via: Teaspoon;No straw Medication Administration: Crushed with puree Supervision: Staff to assist with self feeding;Full supervision/cueing for compensatory strategies;Trained caregiver to feed patient Compensations: Slow rate;Small sips/bites;Multiple dry swallows after each bite/sip;Clear throat intermittently Postural Changes and/or Swallow Maneuvers: Seated upright 90 degrees;Upright 30-60 min after meal             Oral Care Recommendations: Oral care BID;Oral care prior to ice chip/H20;Staff/trained caregiver to provide oral care Follow up Recommendations: 24 hour supervision/assistance Plan: Continue with current plan of care     Amelia H. Roddie Mc, CCC-SLP Speech Language Pathologist                Wende Bushy 04/25/2015, 12:05 PM

## 2015-04-27 ENCOUNTER — Emergency Department (HOSPITAL_COMMUNITY): Payer: Medicare Other

## 2015-04-27 ENCOUNTER — Encounter (HOSPITAL_COMMUNITY): Payer: Self-pay | Admitting: *Deleted

## 2015-04-27 ENCOUNTER — Inpatient Hospital Stay (HOSPITAL_COMMUNITY)
Admission: EM | Admit: 2015-04-27 | Discharge: 2015-05-02 | DRG: 190 | Disposition: A | Discharge status: Home Health | Payer: Medicare Other | Attending: Internal Medicine | Admitting: Internal Medicine

## 2015-04-27 DIAGNOSIS — E876 Hypokalemia: Secondary | ICD-10-CM | POA: Diagnosis present

## 2015-04-27 DIAGNOSIS — C73 Malignant neoplasm of thyroid gland: Secondary | ICD-10-CM | POA: Diagnosis present

## 2015-04-27 DIAGNOSIS — J440 Chronic obstructive pulmonary disease with acute lower respiratory infection: Secondary | ICD-10-CM | POA: Diagnosis not present

## 2015-04-27 DIAGNOSIS — E039 Hypothyroidism, unspecified: Secondary | ICD-10-CM | POA: Diagnosis present

## 2015-04-27 DIAGNOSIS — B370 Candidal stomatitis: Secondary | ICD-10-CM | POA: Diagnosis present

## 2015-04-27 DIAGNOSIS — E119 Type 2 diabetes mellitus without complications: Secondary | ICD-10-CM

## 2015-04-27 DIAGNOSIS — E43 Unspecified severe protein-calorie malnutrition: Secondary | ICD-10-CM | POA: Diagnosis present

## 2015-04-27 DIAGNOSIS — E038 Other specified hypothyroidism: Secondary | ICD-10-CM | POA: Diagnosis not present

## 2015-04-27 DIAGNOSIS — Y842 Radiological procedure and radiotherapy as the cause of abnormal reaction of the patient, or of later complication, without mention of misadventure at the time of the procedure: Secondary | ICD-10-CM | POA: Diagnosis present

## 2015-04-27 DIAGNOSIS — Z86711 Personal history of pulmonary embolism: Secondary | ICD-10-CM

## 2015-04-27 DIAGNOSIS — Z9221 Personal history of antineoplastic chemotherapy: Secondary | ICD-10-CM

## 2015-04-27 DIAGNOSIS — Z87891 Personal history of nicotine dependence: Secondary | ICD-10-CM

## 2015-04-27 DIAGNOSIS — J189 Pneumonia, unspecified organism: Secondary | ICD-10-CM | POA: Diagnosis present

## 2015-04-27 DIAGNOSIS — K219 Gastro-esophageal reflux disease without esophagitis: Secondary | ICD-10-CM | POA: Diagnosis present

## 2015-04-27 DIAGNOSIS — Z9049 Acquired absence of other specified parts of digestive tract: Secondary | ICD-10-CM

## 2015-04-27 DIAGNOSIS — Z923 Personal history of irradiation: Secondary | ICD-10-CM

## 2015-04-27 DIAGNOSIS — Z6823 Body mass index (BMI) 23.0-23.9, adult: Secondary | ICD-10-CM

## 2015-04-27 DIAGNOSIS — Y95 Nosocomial condition: Secondary | ICD-10-CM | POA: Diagnosis present

## 2015-04-27 DIAGNOSIS — K1233 Oral mucositis (ulcerative) due to radiation: Secondary | ICD-10-CM | POA: Diagnosis present

## 2015-04-27 DIAGNOSIS — T451X5A Adverse effect of antineoplastic and immunosuppressive drugs, initial encounter: Secondary | ICD-10-CM | POA: Diagnosis present

## 2015-04-27 DIAGNOSIS — Z90710 Acquired absence of both cervix and uterus: Secondary | ICD-10-CM

## 2015-04-27 DIAGNOSIS — E860 Dehydration: Secondary | ICD-10-CM | POA: Diagnosis present

## 2015-04-27 DIAGNOSIS — Z825 Family history of asthma and other chronic lower respiratory diseases: Secondary | ICD-10-CM

## 2015-04-27 DIAGNOSIS — R0602 Shortness of breath: Secondary | ICD-10-CM | POA: Diagnosis not present

## 2015-04-27 DIAGNOSIS — C7931 Secondary malignant neoplasm of brain: Secondary | ICD-10-CM | POA: Diagnosis present

## 2015-04-27 DIAGNOSIS — Z880 Allergy status to penicillin: Secondary | ICD-10-CM

## 2015-04-27 DIAGNOSIS — Z800 Family history of malignant neoplasm of digestive organs: Secondary | ICD-10-CM

## 2015-04-27 DIAGNOSIS — D61810 Antineoplastic chemotherapy induced pancytopenia: Secondary | ICD-10-CM | POA: Diagnosis present

## 2015-04-27 DIAGNOSIS — Z8249 Family history of ischemic heart disease and other diseases of the circulatory system: Secondary | ICD-10-CM

## 2015-04-27 LAB — CBC WITH DIFFERENTIAL/PLATELET
BASOS ABS: 0 10*3/uL (ref 0.0–0.1)
Basophils Relative: 1 %
Eosinophils Absolute: 0.4 10*3/uL (ref 0.0–0.7)
Eosinophils Relative: 6 %
HEMATOCRIT: 34.8 % — AB (ref 36.0–46.0)
HEMOGLOBIN: 10.9 g/dL — AB (ref 12.0–15.0)
LYMPHS ABS: 0.3 10*3/uL — AB (ref 0.7–4.0)
LYMPHS PCT: 6 %
MCH: 29.9 pg (ref 26.0–34.0)
MCHC: 31.3 g/dL (ref 30.0–36.0)
MCV: 95.6 fL (ref 78.0–100.0)
Monocytes Absolute: 0.6 10*3/uL (ref 0.1–1.0)
Monocytes Relative: 10 %
NEUTROS ABS: 4.4 10*3/uL (ref 1.7–7.7)
NEUTROS PCT: 77 %
Platelets: 233 10*3/uL (ref 150–400)
RBC: 3.64 MIL/uL — AB (ref 3.87–5.11)
RDW: 19.5 % — ABNORMAL HIGH (ref 11.5–15.5)
WBC: 5.7 10*3/uL (ref 4.0–10.5)

## 2015-04-27 LAB — CBC
HCT: 36.1 % (ref 36.0–46.0)
Hemoglobin: 11.6 g/dL — ABNORMAL LOW (ref 12.0–15.0)
MCH: 30.4 pg (ref 26.0–34.0)
MCHC: 32.1 g/dL (ref 30.0–36.0)
MCV: 94.8 fL (ref 78.0–100.0)
PLATELETS: 241 10*3/uL (ref 150–400)
RBC: 3.81 MIL/uL — AB (ref 3.87–5.11)
RDW: 19.3 % — AB (ref 11.5–15.5)
WBC: 5.5 10*3/uL (ref 4.0–10.5)

## 2015-04-27 LAB — COMPREHENSIVE METABOLIC PANEL
ALT: 10 U/L — ABNORMAL LOW (ref 14–54)
ANION GAP: 7 (ref 5–15)
AST: 25 U/L (ref 15–41)
Albumin: 3 g/dL — ABNORMAL LOW (ref 3.5–5.0)
Alkaline Phosphatase: 66 U/L (ref 38–126)
BUN: 6 mg/dL (ref 6–20)
CHLORIDE: 112 mmol/L — AB (ref 101–111)
CO2: 23 mmol/L (ref 22–32)
Calcium: 8.8 mg/dL — ABNORMAL LOW (ref 8.9–10.3)
Creatinine, Ser: 0.48 mg/dL (ref 0.44–1.00)
Glucose, Bld: 77 mg/dL (ref 65–99)
POTASSIUM: 4.3 mmol/L (ref 3.5–5.1)
Sodium: 142 mmol/L (ref 135–145)
TOTAL PROTEIN: 5.6 g/dL — AB (ref 6.5–8.1)
Total Bilirubin: 0.9 mg/dL (ref 0.3–1.2)

## 2015-04-27 LAB — I-STAT CG4 LACTIC ACID, ED: Lactic Acid, Venous: 0.68 mmol/L (ref 0.5–2.0)

## 2015-04-27 MED ORDER — NYSTATIN 100000 UNIT/ML MT SUSP
5.0000 mL | Freq: Four times a day (QID) | OROMUCOSAL | Status: DC
  Administered 2015-04-27 – 2015-05-02 (×20): 500000 [IU] via OROMUCOSAL
  Filled 2015-04-27 (×22): qty 5

## 2015-04-27 MED ORDER — SONAFINE EX EMUL
1.0000 "application " | Freq: Every day | CUTANEOUS | Status: DC
  Administered 2015-04-28 – 2015-05-02 (×5): 1 via TOPICAL
  Filled 2015-04-27: qty 45

## 2015-04-27 MED ORDER — ALPRAZOLAM 0.5 MG PO TABS
0.5000 mg | ORAL_TABLET | Freq: Three times a day (TID) | ORAL | Status: DC | PRN
  Administered 2015-04-28 – 2015-05-02 (×8): 0.5 mg via ORAL
  Filled 2015-04-27 (×5): qty 1
  Filled 2015-04-27: qty 2
  Filled 2015-04-27 (×2): qty 1

## 2015-04-27 MED ORDER — DEXTROSE 5 % IV SOLN
2.0000 g | Freq: Once | INTRAVENOUS | Status: AC
  Administered 2015-04-27: 2 g via INTRAVENOUS
  Filled 2015-04-27: qty 2

## 2015-04-27 MED ORDER — AMITRIPTYLINE HCL 25 MG PO TABS
25.0000 mg | ORAL_TABLET | Freq: Every day | ORAL | Status: DC
  Administered 2015-04-27 – 2015-05-01 (×5): 25 mg via ORAL
  Filled 2015-04-27 (×6): qty 1

## 2015-04-27 MED ORDER — LEVOTHYROXINE SODIUM 175 MCG PO TABS
175.0000 ug | ORAL_TABLET | Freq: Every day | ORAL | Status: DC
  Administered 2015-04-28 – 2015-05-02 (×5): 175 ug via ORAL
  Filled 2015-04-27 (×7): qty 1

## 2015-04-27 MED ORDER — VALACYCLOVIR HCL 500 MG PO TABS
1000.0000 mg | ORAL_TABLET | Freq: Every day | ORAL | Status: DC
  Administered 2015-04-28 – 2015-05-02 (×5): 1000 mg via ORAL
  Filled 2015-04-27 (×5): qty 2

## 2015-04-27 MED ORDER — NYSTATIN 100000 UNIT/ML MT SUSP
5.0000 mL | Freq: Four times a day (QID) | OROMUCOSAL | Status: DC
  Filled 2015-04-27: qty 5

## 2015-04-27 MED ORDER — OXYCODONE HCL 5 MG PO TABS
20.0000 mg | ORAL_TABLET | ORAL | Status: DC | PRN
  Administered 2015-04-27 – 2015-05-02 (×24): 20 mg via ORAL
  Filled 2015-04-27 (×24): qty 4

## 2015-04-27 MED ORDER — PANTOPRAZOLE SODIUM 40 MG PO TBEC
40.0000 mg | DELAYED_RELEASE_TABLET | Freq: Two times a day (BID) | ORAL | Status: DC
  Administered 2015-04-27 – 2015-05-02 (×10): 40 mg via ORAL
  Filled 2015-04-27 (×15): qty 1

## 2015-04-27 MED ORDER — SODIUM CHLORIDE 0.9 % IV BOLUS (SEPSIS)
500.0000 mL | Freq: Once | INTRAVENOUS | Status: AC
  Administered 2015-04-27: 500 mL via INTRAVENOUS

## 2015-04-27 MED ORDER — DEXTROSE 5 % IV SOLN
2.0000 g | Freq: Three times a day (TID) | INTRAVENOUS | Status: DC
  Filled 2015-04-27 (×2): qty 2

## 2015-04-27 MED ORDER — ONDANSETRON 4 MG PO TBDP
8.0000 mg | ORAL_TABLET | Freq: Three times a day (TID) | ORAL | Status: DC | PRN
  Administered 2015-04-30: 8 mg via ORAL
  Filled 2015-04-27: qty 2

## 2015-04-27 MED ORDER — ENSURE ENLIVE PO LIQD
237.0000 mL | Freq: Two times a day (BID) | ORAL | Status: DC
Start: 2015-04-28 — End: 2015-05-02
  Administered 2015-04-28 – 2015-05-02 (×9): 237 mL via ORAL

## 2015-04-27 MED ORDER — DEXTROSE 5 % IV SOLN
2.0000 g | Freq: Three times a day (TID) | INTRAVENOUS | Status: DC
Start: 2015-04-27 — End: 2015-05-01
  Administered 2015-04-27 – 2015-05-01 (×11): 2 g via INTRAVENOUS
  Filled 2015-04-27 (×13): qty 2

## 2015-04-27 MED ORDER — PRUTECT EX EMUL
1.0000 | Freq: Every day | CUTANEOUS | Status: DC
Start: 2015-04-28 — End: 2015-04-27
  Filled 2015-04-27: qty 45

## 2015-04-27 MED ORDER — HEPARIN SODIUM (PORCINE) 5000 UNIT/ML IJ SOLN
5000.0000 [IU] | Freq: Three times a day (TID) | INTRAMUSCULAR | Status: DC
  Administered 2015-04-27 – 2015-05-02 (×14): 5000 [IU] via SUBCUTANEOUS
  Filled 2015-04-27 (×18): qty 1

## 2015-04-27 MED ORDER — MAGIC MOUTHWASH W/LIDOCAINE
5.0000 mL | Freq: Four times a day (QID) | ORAL | Status: DC | PRN
  Administered 2015-05-01: 5 mL via ORAL
  Filled 2015-04-27 (×2): qty 5

## 2015-04-27 MED ORDER — SUCRALFATE 1 G PO TABS
1.0000 g | ORAL_TABLET | Freq: Three times a day (TID) | ORAL | Status: DC
  Administered 2015-04-28 – 2015-05-02 (×18): 1 g via ORAL
  Filled 2015-04-27 (×21): qty 1

## 2015-04-27 MED ORDER — ALBUTEROL SULFATE (2.5 MG/3ML) 0.083% IN NEBU: 2.5000 mg | INHALATION_SOLUTION | RESPIRATORY_TRACT | Status: DC | PRN

## 2015-04-27 MED ORDER — VANCOMYCIN HCL 500 MG IV SOLR
500.0000 mg | Freq: Two times a day (BID) | INTRAVENOUS | Status: DC
  Administered 2015-04-28 – 2015-04-30 (×6): 500 mg via INTRAVENOUS
  Filled 2015-04-27 (×7): qty 500

## 2015-04-27 MED ORDER — VANCOMYCIN HCL IN DEXTROSE 1-5 GM/200ML-% IV SOLN
1000.0000 mg | Freq: Once | INTRAVENOUS | Status: AC
  Administered 2015-04-27: 1000 mg via INTRAVENOUS
  Filled 2015-04-27: qty 200

## 2015-04-27 MED ORDER — SODIUM CHLORIDE 0.9 % IV SOLN
INTRAVENOUS | Status: DC
  Administered 2015-04-27 – 2015-04-30 (×5): via INTRAVENOUS

## 2015-04-27 NOTE — ED Notes (Signed)
Dr. Alvino Chapel made aware of no IV access in pt.

## 2015-04-27 NOTE — ED Provider Notes (Signed)
CSN: [id number]     Arrival date & time 04/27/15  1149 History   First MD Initiated Contact with Patient 04/27/15 1214     Chief Complaint  Patient presents with  . Weakness  . Pneumonia     Patient is a 60 y.o. female presenting with weakness and pneumonia. The history is provided by the patient.  Weakness This is a recurrent problem. Associated symptoms include headaches. Pertinent negatives include no chest pain and no abdominal pain.  Pneumonia Associated symptoms include headaches. Pertinent negatives include no chest pain and no abdominal pain.   patient presents with shortness of breath and weakness. Discharge from [facility] 2 days ago after pneumonia. She has metastatic adenocarcinoma and his been on chemotherapy. She's gone home and has had more sputum production and began to feel more weak. Has been unable to get up and walk around. She's had a decreased appetite. She states she's brought up a lot of sputum but feels that she will choke on it. She is currently on oral antibiotics. She is a full code. Oxygen saturation was in the 80s at home.  Past Medical History  Diagnosis Date  . Essential hypertension   . Anxiety   . Depression   . COPD (chronic obstructive pulmonary disease) ([place])   . GERD (gastroesophageal reflux disease)   . Headache(784.0)   . Arthritis   . Hx of adenomatous colonic polyps   . Emphysema   . Cataract     1 lens implaqnt right eye,intact on left eye  . Goiter   . Fibromyalgia   . Pulmonary emboli ([place]) 11/04/11    Right upper lobe and r lower lobe PE  . Lung nodule seen on imaging study 11/04/11 CT    77m LLL  . DVT (deep venous thrombosis) ([place] 12/09/2011  . Pulmonary embolism ([place] [place] 12/09/2011  . Hyperlipidemia   . History of radiation therapy 10/06/11    SRS 15Gy 156f brain  . UTI (lower urinary tract infection)   . Borderline diabetes mellitus   . Metastatic adenocarcinoma to brain ([place]CD[place]6/12/13    Left frontoparietal region  . Thyroid  cancer ([place]  . S/P radiation therapy 05/31/14 SRS    Brain  . PONV (postoperative nausea and vomiting)   . Controlled diabetes mellitus type II without complication ([place]1281/44/8185  Previously diagnosed with borderline diabetes.  . Marland Kitchenerpes simplex esophagitis 03/11/2015    Also candidal esophagitis  . HCAP (healthcare-associated pneumonia) 03/03/2015  . Mucositis due to radiation therapy 03/03/2015   Past Surgical History  Procedure Laterality Date  . Abdominal hysterectomy  1996  . Cholecystectomy  2000  . Shoulder surgery Right 1998  . Eye surgery  2001  . Foot surgery Left 2005  . Craniotomy  09/15/2011    Procedure: CRANIOTOMY TUMOR EXCISION;  Surgeon: RoHosie SpangleMD;  [facility];  Service: Neurosurgery;  Laterality: N/A;  Craniotomy resection of tumor with stealth  . Cataract extraction  2011    With lens implant  . [facility]  [facility]-29-2013    [facility]    . Video bronchoscopy with endobronchial ultrasound N/A 04/11/2014    Procedure: VIDEO BRONCHOSCOPY WITH ENDOBRONCHIAL ULTRASOUND;  Surgeon: StMelrose NakayamaMD;  Location: [facility] Service: Thoracic;  Laterality: N/A;  . Esophagogastroduodenoscopy (egd) with propofol N/A 03/07/2015    Procedure: ESOPHAGOGASTRODUODENOSCOPY (EGD) WITH PROPOFOL;  Surgeon: RoDaneil DolinMD;  Location:  [facility];  Service: Endoscopy;  Laterality: N/A;  . Esophageal biopsy  03/07/2015    Procedure: BIOPSY;  Surgeon: Daneil Dolin, MD;  Location: [facility];  Service: Endoscopy;;   Family History  Problem Relation Age of Onset  . Asthma Mother   . Kidney failure Father   . Diabetes Sister   . Heart attack Sister   . Colon cancer Brother   . COPD Sister   . Aneurysm Paternal Grandmother     Brain  . Parkinsonism Maternal Uncle   . COPD Brother    Social History  Substance Use Topics  . Smoking status: Former Smoker -- 1.00 packs/day for 40 years     Types: Cigarettes    Quit date: 04/06/2011  . Smokeless tobacco: Never Used  . Alcohol Use: No   OB History    No data available     Review of Systems  Constitutional: Positive for appetite change and fatigue.  Respiratory: Positive for cough.   Cardiovascular: Negative for chest pain.  Gastrointestinal: Negative for abdominal pain.  Genitourinary: Negative for dysuria.  Musculoskeletal: Negative for back pain and neck pain.  Skin: Negative for rash.  Neurological: Positive for weakness and headaches.      Allergies  Penicillins; Other; Codeine; and Tape  Home Medications   Prior to Admission medications   Medication Sig Start Date End Date Taking? Authorizing Provider  albuterol (PROVENTIL) (2.5 MG/3ML) 0.083% nebulizer solution Take 3 mLs (2.5 mg total) by nebulization every 4 (four) hours as needed for wheezing or shortness of breath. 04/25/15  Yes Orvan Falconer, MD  ALPRAZolam Duanne Moron) 0.5 MG tablet Take 0.5 mg by mouth 3 (three) times daily as needed for anxiety.  01/04/12  Yes Historical Provider, MD  amitriptyline (ELAVIL) 25 MG tablet Take 1 tablet (25 mg total) by mouth at bedtime. 04/25/15  Yes Orvan Falconer, MD  feeding supplement, ENSURE ENLIVE, (ENSURE ENLIVE) LIQD Take 237 mLs by mouth 2 (two) times daily between meals. 04/25/15  Yes Orvan Falconer, MD  levofloxacin (LEVAQUIN) 750 MG tablet Take 1 tablet (750 mg total) by mouth daily. 04/25/15  Yes Orvan Falconer, MD  levothyroxine (SYNTHROID, LEVOTHROID) 175 MCG tablet Take 175 mcg by mouth daily before breakfast.   Yes Historical Provider, MD  magic mouthwash w/lidocaine SOLN Take 5 mLs by mouth 4 (four) times daily as needed for mouth pain. 03/13/15  Yes Velvet Bathe, MD  Magnesium 250 MG TABS Take 1 tablet by mouth daily.   Yes Historical Provider, MD  metroNIDAZOLE (FLAGYL) 500 MG tablet Take 1 tablet (500 mg total) by mouth 3 (three) times daily. 04/25/15  Yes Orvan Falconer, MD  ondansetron (ZOFRAN ODT) 8 MG disintegrating tablet Take 1 tablet  (8 mg total) by mouth every 8 (eight) hours as needed for nausea or vomiting. 04/05/14  Yes Pattricia Boss, MD  Oxycodone HCl 20 MG TABS Take 1 tablet by mouth every 4 (four) hours as needed (pain).   Yes Historical Provider, MD  pantoprazole (PROTONIX) 40 MG tablet Take 1 tablet (40 mg total) by mouth 2 (two) times daily. 03/13/15  Yes Velvet Bathe, MD  potassium chloride 20 MEQ/15ML (10%) SOLN Take 30 mLs (40 mEq total) by mouth daily. 04/25/15  Yes Orvan Falconer, MD  sucralfate (CARAFATE) 1 G tablet Take 1 tablet (1 g total) by mouth 4 (four) times daily -  with meals and at bedtime. 03/13/15  Yes Velvet Bathe, MD  valACYclovir (VALTREX) 1000 MG tablet Take 1 tablet (  1,000 mg total) by mouth daily. 04/25/15  Yes Orvan Falconer, MD  Wound Dressings (SONAFINE) Apply 1 application topically daily.   Yes Historical Provider, MD  bisacodyl (DULCOLAX) 10 MG suppository Place 1 suppository (10 mg total) rectally daily as needed for moderate constipation. Patient not taking: Reported on 04/27/2015 04/25/15   Orvan Falconer, MD  polyethylene glycol [facility] / Floria Raveling) packet Take 17 g by mouth every other day. Reported on 04/27/2015    Historical Provider, MD   BP 97/70 mmHg  Pulse 94  Temp(Src) 99.5 F (37.5 C) (Rectal)  Resp 18  SpO2 96% Physical Exam  Constitutional: She appears well-developed.  HENT:  Head: Atraumatic.  Neck: Neck supple.  Cardiovascular: Normal rate.   Pulmonary/Chest:  Patient is on nasal cannula oxygen. Diffuse harsh breath sounds at both bases.  Abdominal: Soft. There is no tenderness.  Musculoskeletal: Normal range of motion.  Neurological: She is alert.  Skin: Skin is warm.    ED Course  Procedures (including critical care time) Labs Review Labs Reviewed  CBC WITH DIFFERENTIAL/PLATELET - Abnormal; Notable for the following:    RBC 3.64 (*)    Hemoglobin 10.9 (*)    HCT 34.8 (*)    RDW 19.5 (*)    Lymphs Abs 0.3 (*)    All other components within normal limits  COMPREHENSIVE  METABOLIC PANEL  I-STAT CG4 LACTIC ACID, ED    Imaging Review Dg Chest Portable 1 View  04/27/2015  CLINICAL DATA:  Shortness of breath and weakness today. Patient diagnosed with bilateral pneumonia 04/25/2015. EXAM: PORTABLE CHEST 1 VIEW COMPARISON:  04/18/2015 FINDINGS: Cardiomediastinal silhouette is normal. Mediastinal contours appear intact. There is no evidence of pleural effusion or pneumothorax. Bilateral lower lobe linear and patchy airspace consolidation has slightly worsened. Osseous structures are without acute abnormality. Soft tissues are grossly normal. IMPRESSION: Bilateral lower lobe patchy and peribronchovascular airspace consolidation, likely representing multifocal pneumonia. Electronically Signed   By: Fidela Salisbury M.D.   On: 04/27/2015 13:54   I have personally reviewed and evaluated these images and lab results as part of my medical decision-making.   EKG Interpretation None      MDM   Final diagnoses:  HCAP (healthcare-associated pneumonia)    Patient with recent admission for pneumonia. Discharge days ago. Has been doing worse at home. Lactic acid reassuring. X-ray continues to show pneumonia. Has been on Levaquin home. Will start Vancomycin and Aztreonam. Will Admit to Internal Medicine.    Davonna Belling, MD 04/27/15 1556

## 2015-04-27 NOTE — ED Notes (Signed)
2 unsuccessful IV attempts.

## 2015-04-27 NOTE — Progress Notes (Signed)
ANTIBIOTIC CONSULT NOTE - INITIAL  Pharmacy Consult for Vancomycin & Aztreonam Indication: pneumonia  Allergies  Allergen Reactions  . Penicillins Swelling    " my brain swelled, and mymouth" Has patient had a PCN reaction causing immediate rash, facial/tongue/throat swelling, SOB or lightheadedness with hypotension: Yes Has patient had a PCN reaction causing severe rash involving mucus membranes or skin necrosis: No Has patient had a PCN reaction that required hospitalization No Has patient had a PCN reaction occurring within the last 10 years: No If all of the above answers are "NO", then may proceed with Cephalosporin use.   . Other Swelling    PT ALSO REACTS TO PAPER TAPE AND BAND-AIDS.  Marland Kitchen Codeine Itching    Mild takes benadryl  . Tape Rash    Patient Measurements:    Vital Signs: Temp: 99 F (37.2 C) (01/22 1202) Temp Source: Oral (01/22 1202) BP: 114/72 mmHg (01/22 1202) Pulse Rate: 97 (01/22 1202) Intake/Output from previous day:   Intake/Output from this shift:    Labs:  Recent Labs  04/25/15 0541  WBC 7.8  HGB 12.3  PLT 181  CREATININE 0.40*   Estimated Creatinine Clearance: 65.4 mL/min (by C-G formula based on Cr of 0.4). No results for input(s): VANCOTROUGH, VANCOPEAK, VANCORANDOM, GENTTROUGH, GENTPEAK, GENTRANDOM, TOBRATROUGH, TOBRAPEAK, TOBRARND, AMIKACINPEAK, AMIKACINTROU, AMIKACIN in the last 72 hours.   Microbiology: Recent Results (from the past 720 hour(s))  Blood culture (routine x 2)     Status: None   Collection Time: 04/18/15  9:25 PM  Result Value Ref Range Status   Specimen Description BLOOD RIGHT ARM  Final   Special Requests BOTTLES DRAWN AEROBIC AND ANAEROBIC 4CC  Final   Culture NO GROWTH 5 DAYS  Final   Report Status 04/23/2015 FINAL  Final  Urine culture     Status: None   Collection Time: 04/18/15 10:30 PM  Result Value Ref Range Status   Specimen Description URINE, CATHETERIZED  Final   Special Requests NONE  Final   Culture   Final    NO GROWTH 2 DAYS Performed at [facility]    Report Status 04/21/2015 FINAL  Final  Blood culture (routine x 2)     Status: None   Collection Time: 04/18/15 11:00 PM  Result Value Ref Range Status   Specimen Description BLOOD RIGHT HAND  Final   Special Requests BOTTLES DRAWN AEROBIC ONLY 2CC  Final   Culture NO GROWTH 5 DAYS  Final   Report Status 04/23/2015 FINAL  Final  MRSA PCR Screening     Status: None   Collection Time: 04/19/15  1:50 AM  Result Value Ref Range Status   MRSA by PCR NEGATIVE NEGATIVE Final    Comment:        The GeneXpert MRSA Assay (FDA approved for NASAL specimens only), is one component of a comprehensive MRSA colonization surveillance program. It is not intended to diagnose MRSA infection nor to guide or monitor treatment for MRSA infections.   Culture, respiratory (NON-Expectorated)     Status: None   Collection Time: 04/20/15 10:20 AM  Result Value Ref Range Status   Specimen Description SPUTUM  Final   Special Requests NONE  Final   Gram Stain   Final    ABUNDANT WBC PRESENT,BOTH PMN AND MONONUCLEAR FEW SQUAMOUS EPITHELIAL CELLS PRESENT MODERATE GRAM POSITIVE COCCI IN PAIRS IN CLUSTERS MODERATE GRAM POSITIVE RODS Performed at [facility]    Culture   Final    NORMAL OROPHARYNGEAL  FLORA Performed at [facility]    Report Status 04/24/2015 FINAL  Final    Medical History: Past Medical History  Diagnosis Date  . Essential hypertension   . Anxiety   . Depression   . COPD (chronic obstructive pulmonary disease) ([place])   . GERD (gastroesophageal reflux disease)   . Headache(784.0)   . Arthritis   . Hx of adenomatous colonic polyps   . Emphysema   . Cataract     1 lens implaqnt right eye,intact on left eye  . Goiter   . Fibromyalgia   . Pulmonary emboli ([place]) 11/04/11    Right upper lobe and r lower lobe PE  . Lung nodule seen on imaging study 11/04/11 CT    69m LLL  . DVT (deep venous  thrombosis) ([place] 12/09/2011  . Pulmonary embolism ([place] 12/09/2011  . Hyperlipidemia   . History of radiation therapy 10/06/11    SRS 15Gy 165f brain  . UTI (lower urinary tract infection)   . Borderline diabetes mellitus   . Metastatic adenocarcinoma to brain ([facility]6/12/13    Left frontoparietal region  . Thyroid cancer ([facility]2015  . S/P radiation therapy 05/31/14 SRS    Brain  . PONV (postoperative nausea and vomiting)   . Controlled diabetes mellitus type II without complication (HCO[place]1297/05/6376  Previously diagnosed with borderline diabetes.  . Marland Kitchenerpes simplex esophagitis 03/11/2015    Also candidal esophagitis  . HCAP (healthcare-associated pneumonia) 03/03/2015  . Mucositis due to radiation therapy 03/03/2015    Medications:  Anti-infectives    None     Assessment: [age]o F with recurrent aspiration PNA and metastatic adenocarcinoma to brain & thyroid CA [facility] She is at high risk for aspiration and [facility].  She was currently completing course of Levaquin and Flagyl PO.   Patient returns to ED today with shortness of breath.  Pharmacy asked to dose Vancomycin & Aztreonam.     O2 sat 89% on presentation  Afebrile (Tm 99.21F)  WBC wnl (7.8)  Scr is at patient's baseline.  Given low Scr (0.4) its possible CrCl is over-estimated.  Estimated CrCl (CG) ~ 6331min.    CXR 1/22 + BLL PNA  [facility]bials this admission ([facility]13 Aztreonam >> 1/17; 1/22>> 1/14 Vanc >> 1/14; 1/22>> 1/15 Clinda >>1/18 1/18 Levaquin>>1/22 1/18 Flagyl>>1/22  Levels/dose changes this admission:  Microbiology Results: none  Goal of Therapy:  Vancomycin trough level 15-20 mcg/ml  Plan:  Aztreonam 2gm IV q8h Vancomycin 1gm IV x1 now then '500mg'$  IV q12h Check Vancomycin trough at steady state Monitor renal function and cx data   LilBiagio Borg22/2017,2:11 PM

## 2015-04-27 NOTE — ED Provider Notes (Signed)
D/w Dr. Wendee Beavers - requests CT angio prior to placing pt in bed.  Noemi Chapel, MD 04/27/15 1705

## 2015-04-27 NOTE — ED Notes (Signed)
Per family and pt report: pt discharged from [facility] 04/25/15 with bilateral pneumonia.  Today, pt presents to the ED with SOB and weakness.  Pt is 88% on RA.  Pt is thyroid CA pt and is seen at [facility]. Pt has chronic pain at her neck and throat. Pt last had radiation on 02/26/15 and chemo on 04/19/15.  Pt put on 2L [place] and O2 sats improved to 92%.

## 2015-04-27 NOTE — H&P (Signed)
[facility] History and Physical  Haley Lowe Haley Lowe:[id number] DOB: [birth year] DOA: 04/27/2015  Referring physician: Dr. Sabra Heck PCP: Haley Lowe., MD   Chief Complaint: Cough and shortness of breath  HPI: Haley Lowe is a 60 y.o. female  With history of thyroid cancer, metastatic adenocarcinoma to brain who is undergoing chemotherapy at [facility]. His presenting with worsening weakness cough and shortness of breath. The problem has been present for the last 2 weeks and has progressively gotten worse. Patient reports coughing up phlegm which has been discolored. Patient denies coughing up any blood however. Given the persistence in problem patient presented to the ED for further evaluation recommendations.  Patient had difficulty obtaining IV access as such they were not able to obtain CT angiogram of chest in the ED. Plan will be to obtain IV access so we can obtain CT angiogram of chest.   Review of Systems:  Constitutional:  No weight loss, night sweats, Fevers, chills, fatigue.  HEENT:  No headaches, Difficulty swallowing,Tooth/dental problems,Sore throat,  No sneezing, itching, ear ache, nasal congestion, post nasal drip,  Cardio-vascular:  No chest pain, Orthopnea, PND, swelling in lower extremities, anasarca, dizziness, palpitations  GI:  No heartburn, indigestion, abdominal pain, nausea, vomiting, diarrhea, change in bowel habits, loss of appetite  Resp:  + shortness of breath with exertion or at rest. No excess mucus, + productive cough, + non-productive cough, No coughing up of blood.No change in color of mucus.No wheezing.No chest wall deformity  Skin:  no rash or lesions.  GU:  no dysuria, change in color of urine, no urgency or frequency. No flank pain.  Musculoskeletal:  No joint pain or swelling. No decreased range of motion. No back pain.  Psych:  No change in mood or affect. No depression or anxiety. No memory loss.   Past Medical History  Diagnosis  Date  . Essential hypertension   . Anxiety   . Depression   . COPD (chronic obstructive pulmonary disease) ([place])   . GERD (gastroesophageal reflux disease)   . Headache(784.0)   . Arthritis   . Hx of adenomatous colonic polyps   . Emphysema   . Cataract     1 lens implaqnt right eye,intact on left eye  . Goiter   . Fibromyalgia   . Pulmonary emboli (Haley Lowe) 11/04/11    Right upper lobe and r lower lobe PE  . Lung nodule seen on imaging study 11/04/11 CT    60m LLL  . DVT (deep venous thrombosis) ([facility] 12/09/2011  . Pulmonary embolism (Haley Lowe 12/09/2011  . Hyperlipidemia   . History of radiation therapy 10/06/11    SRS 15Gy 175f brain  . UTI (lower urinary tract infection)   . Borderline diabetes mellitus   . Metastatic adenocarcinoma to brain ([facility]6/12/13    Left frontoparietal region  . Thyroid cancer ([facility]  . S/P radiation therapy 05/31/14 SRS    Brain  . PONV (postoperative nausea and vomiting)   . Controlled diabetes mellitus type II without complication ([id number]  Previously diagnosed with borderline diabetes.  . Marland Kitchenerpes simplex esophagitis 03/11/2015    Also candidal esophagitis  . HCAP (healthcare-associated pneumonia) 03/03/2015  . Mucositis due to radiation therapy 03/03/2015   Past Surgical History  Procedure Laterality Date  . Abdominal hysterectomy  1996  . Cholecystectomy  2000  . Shoulder surgery Right 1998  . Eye surgery  2001  . Foot surgery Left 2005  . Craniotomy  09/15/2011    Procedure: CRANIOTOMY  TUMOR EXCISION;  Surgeon: Hosie Spangle, MD;  Location: [facility];  Service: Neurosurgery;  Laterality: N/A;  Craniotomy resection of tumor with stealth  . Cataract extraction  2011    With lens implant  . Total thyroidectomy  10-29-2013    [facility]  . Lymph gland excision    . Video bronchoscopy with endobronchial ultrasound N/A 04/11/2014    Procedure: VIDEO BRONCHOSCOPY WITH ENDOBRONCHIAL ULTRASOUND;  Surgeon: Melrose Nakayama, MD;  Location: [place];  Service: Thoracic;  Laterality: N/A;  . Esophagogastroduodenoscopy (egd) with propofol N/A 03/07/2015    Procedure: ESOPHAGOGASTRODUODENOSCOPY (EGD) WITH PROPOFOL;  Surgeon: Daneil Dolin, MD;  [facility];  Service: Endoscopy;  Laterality: N/A;  . Esophageal biopsy  03/07/2015    Procedure: BIOPSY;  Surgeon: Daneil Dolin, MD;  [facility];  Service: Endoscopy;;   Social History:  reports that she quit smoking about 4 years ago. Her smoking use included Cigarettes. She has a 40 pack-year smoking history. She has never used smokeless tobacco. She reports that she does not drink alcohol or use illicit drugs.  Allergies  Allergen Reactions  . Penicillins Swelling    " my brain swelled, and mymouth" Has patient had a PCN reaction causing immediate rash, facial/tongue/throat swelling, SOB or lightheadedness with hypotension: Yes Has patient had a PCN reaction causing severe rash involving mucus membranes or skin necrosis: No Has patient had a PCN reaction that required hospitalization No Has patient had a PCN reaction occurring within the last 10 years: No If all of the above answers are "NO", then may proceed with Cephalosporin use.   . Other Swelling    PT ALSO REACTS TO PAPER TAPE AND BAND-AIDS.  Marland Kitchen Codeine Itching    Mild takes benadryl  . Tape Rash    Family History  Problem Relation Age of Onset  . Asthma Mother   . Kidney failure Father   . Diabetes Sister   . Heart attack Sister   . Colon cancer Brother   . COPD Sister   . Aneurysm Paternal Grandmother     Brain  . Parkinsonism Maternal Uncle   . COPD Brother     Prior to Admission medications   Medication Sig Start Date End Date Taking? Authorizing Provider  albuterol (PROVENTIL) (2.5 MG/3ML) 0.083% nebulizer solution Take 3 mLs (2.5 mg total) by nebulization every 4 (four) hours as needed for wheezing or shortness of breath. 04/25/15  Yes Orvan Falconer, MD    ALPRAZolam Duanne Moron) 0.5 MG tablet Take 0.5 mg by mouth 3 (three) times daily as needed for anxiety.  01/04/12  Yes Historical Provider, MD  amitriptyline (ELAVIL) 25 MG tablet Take 1 tablet (25 mg total) by mouth at bedtime. 04/25/15  Yes Orvan Falconer, MD  feeding supplement, ENSURE ENLIVE, (ENSURE ENLIVE) LIQD Take 237 mLs by mouth 2 (two) times daily between meals. 04/25/15  Yes Orvan Falconer, MD  levofloxacin (LEVAQUIN) 750 MG tablet Take 1 tablet (750 mg total) by mouth daily. 04/25/15  Yes Orvan Falconer, MD  levothyroxine (SYNTHROID, LEVOTHROID) 175 MCG tablet Take 175 mcg by mouth daily before breakfast.   Yes Historical Provider, MD  magic mouthwash w/lidocaine SOLN Take 5 mLs by mouth 4 (four) times daily as needed for mouth pain. 03/13/15  Yes Velvet Bathe, MD  Magnesium 250 MG TABS Take 1 tablet by mouth daily.   Yes Historical Provider, MD  metroNIDAZOLE (FLAGYL) 500 MG tablet Take 1 tablet (500  mg total) by mouth 3 (three) times daily. 04/25/15  Yes Orvan Falconer, MD  ondansetron (ZOFRAN ODT) 8 MG disintegrating tablet Take 1 tablet (8 mg total) by mouth every 8 (eight) hours as needed for nausea or vomiting. 04/05/14  Yes Pattricia Boss, MD  Oxycodone HCl 20 MG TABS Take 1 tablet by mouth every 4 (four) hours as needed (pain).   Yes Historical Provider, MD  pantoprazole (PROTONIX) 40 MG tablet Take 1 tablet (40 mg total) by mouth 2 (two) times daily. 03/13/15  Yes Velvet Bathe, MD  potassium chloride 20 MEQ/15ML (10%) SOLN Take 30 mLs (40 mEq total) by mouth daily. 04/25/15  Yes Orvan Falconer, MD  sucralfate (CARAFATE) 1 G tablet Take 1 tablet (1 g total) by mouth 4 (four) times daily -  with meals and at bedtime. 03/13/15  Yes Velvet Bathe, MD  valACYclovir (VALTREX) 1000 MG tablet Take 1 tablet (1,000 mg total) by mouth daily. 04/25/15  Yes Orvan Falconer, MD  Wound Dressings (SONAFINE) Apply 1 application topically daily.   Yes Historical Provider, MD  bisacodyl (DULCOLAX) 10 MG suppository Place 1 suppository (10 mg total)  rectally daily as needed for moderate constipation. Patient not taking: Reported on 04/27/2015 04/25/15   Orvan Falconer, MD  polyethylene glycol [facility] / Floria Raveling) packet Take 17 g by mouth every other day. Reported on 04/27/2015    Historical Provider, MD   Physical Exam: Filed Vitals:   04/27/15 1202 04/27/15 1203 04/27/15 1418 04/27/15 1628  BP: 114/72  97/70 82/70  Pulse: 97  94 97  Temp: 99 F (37.2 C)  99.5 F (37.5 C) 99 F (37.2 C)  TempSrc: Oral  Rectal Oral  Resp: '18  18 20  '$ SpO2: 89% 92% 96% 98%    Wt Readings from Last 3 Encounters:  04/24/15 63.4 kg (139 lb 12.4 oz)  04/14/15 65.772 kg (145 lb)  03/16/15 66.7 kg (147 lb 0.8 oz)    General:  Appears calm and comfortable Eyes: PERRL, normal lids, irises & conjunctiva ENT: grossly normal hearing, lips & tongue, dry mucous membranes Neck: no LAD, masses or thyromegaly Cardiovascular: RRR, no m/r/g. No LE edema. Respiratory: Rales bilaterally, no wheezes, equal chest rise Abdomen: soft, nt, nd Skin: no rash or induration seen on limited exam Musculoskeletal: grossly normal tone BUE/BLE Psychiatric: grossly normal mood and affect, speech fluent and appropriate Neurologic: grossly non-focal.          Labs on Admission:  Basic Metabolic Panel:  Recent Labs Lab 04/21/15 0441 04/22/15 0407 04/23/15 0444 04/23/15 0705 04/24/15 0502 04/25/15 0541 04/27/15 1456  NA 142 145 144  --   --  145 142  K 3.4* 3.3* 3.0*  --   --  2.9* 4.3  CL 110 112* 113*  --   --  116* 112*  CO2 '25 22 22  '$ --   --  20* 23  GLUCOSE 95 100* 99  --   --  97 77  BUN 6 <5* <5*  --   --  <5* 6  CREATININE <0.30* <0.30* <0.30*  --   --  0.40* 0.48  CALCIUM 7.9* 8.4* 8.4*  --   --  8.4* 8.8*  MG  --   --   --  1.7 1.7  --   --    Liver Function Tests:  Recent Labs Lab 04/27/15 1456  AST 25  ALT 10*  ALKPHOS 66  BILITOT 0.9  PROT 5.6*  ALBUMIN 3.0*   No results for  input(s): LIPASE, AMYLASE in the last 168 hours. No results for  input(s): AMMONIA in the last 168 hours. CBC:  Recent Labs Lab 04/22/15 0407 04/23/15 0444 04/24/15 0502 04/25/15 0541 04/27/15 1456  WBC 3.8* 3.5* 4.5 7.8 5.7  NEUTROABS  --   --   --   --  4.4  HGB 11.9* 12.0 12.2 12.3 10.9*  HCT 36.2 37.4 38.9 39.1 34.8*  MCV 94.3 94.4 95.3 96.3 95.6  PLT 111* 130* 153 181 233   Cardiac Enzymes: No results for input(s): CKTOTAL, CKMB, CKMBINDEX, TROPONINI in the last 168 hours.  BNP (last 3 results)  Recent Labs  03/15/15 2100 04/18/15 2031  BNP 81.0 42.0    ProBNP (last 3 results) No results for input(s): PROBNP in the last 8760 hours.  CBG: No results for input(s): GLUCAP in the last 168 hours.  Radiological Exams on Admission: Dg Chest Portable 1 View  04/27/2015  CLINICAL DATA:  Shortness of breath and weakness today. Patient diagnosed with bilateral pneumonia 04/25/2015. EXAM: PORTABLE CHEST 1 VIEW COMPARISON:  04/18/2015 FINDINGS: Cardiomediastinal silhouette is normal. Mediastinal contours appear intact. There is no evidence of pleural effusion or pneumothorax. Bilateral lower lobe linear and patchy airspace consolidation has slightly worsened. Osseous structures are without acute abnormality. Soft tissues are grossly normal. IMPRESSION: Bilateral lower lobe patchy and peribronchovascular airspace consolidation, likely representing multifocal pneumonia. Electronically Signed   By: Fidela Salisbury M.D.   On: 04/27/2015 13:54    EKG: Independently reviewed. Sinus rhythm with no ST elevations or depressions.  Assessment/Plan Active Problems:   PNA (pneumonia) HCAP -Place routine pneumonia order set and cover for HCAP related organisms - Aztreonam and vancomycin  Oral thrush - consult pharmacy to place on appropriate nystatin regimen  Metastatic adenocarcinoma to brain and thyroid cancer -We'll obtain CT angiogram of chest to rule out PE given shortness of breath in context of this problem. Burnis Medin have patient follow-up  with oncologist after discharge for routine evaluation and recommendations. - Continue home pain medication regimen  GERD -Continue PPI  Hypothyroidism - Plan will be to continue Synthroid  Code Status: full DVT Prophylaxis: heparin Family Communication: d/c spouse at bedside Disposition Plan: pending further work up, for now telemetry monitoring  Time spent: > 55 minutes  Olivia Lopez de Gutierrez, [facility] Pager [phone]

## 2015-04-27 NOTE — ED Notes (Signed)
IV team at bedside 

## 2015-04-27 NOTE — ED Notes (Signed)
Other staff asked to attempt IV and they were also unable to obtain an IV.

## 2015-04-28 ENCOUNTER — Inpatient Hospital Stay (HOSPITAL_COMMUNITY): Payer: Medicare Other

## 2015-04-28 ENCOUNTER — Encounter (HOSPITAL_COMMUNITY): Payer: Self-pay | Admitting: Radiology

## 2015-04-28 DIAGNOSIS — Z9049 Acquired absence of other specified parts of digestive tract: Secondary | ICD-10-CM | POA: Diagnosis not present

## 2015-04-28 DIAGNOSIS — Z86711 Personal history of pulmonary embolism: Secondary | ICD-10-CM | POA: Diagnosis not present

## 2015-04-28 DIAGNOSIS — Z9221 Personal history of antineoplastic chemotherapy: Secondary | ICD-10-CM | POA: Diagnosis not present

## 2015-04-28 DIAGNOSIS — E876 Hypokalemia: Secondary | ICD-10-CM | POA: Diagnosis not present

## 2015-04-28 DIAGNOSIS — E039 Hypothyroidism, unspecified: Secondary | ICD-10-CM | POA: Diagnosis not present

## 2015-04-28 DIAGNOSIS — Z90710 Acquired absence of both cervix and uterus: Secondary | ICD-10-CM | POA: Diagnosis not present

## 2015-04-28 DIAGNOSIS — K1233 Oral mucositis (ulcerative) due to radiation: Secondary | ICD-10-CM | POA: Diagnosis present

## 2015-04-28 DIAGNOSIS — Z87891 Personal history of nicotine dependence: Secondary | ICD-10-CM | POA: Diagnosis not present

## 2015-04-28 DIAGNOSIS — J440 Chronic obstructive pulmonary disease with acute lower respiratory infection: Secondary | ICD-10-CM | POA: Diagnosis present

## 2015-04-28 DIAGNOSIS — E119 Type 2 diabetes mellitus without complications: Secondary | ICD-10-CM | POA: Diagnosis present

## 2015-04-28 DIAGNOSIS — Z825 Family history of asthma and other chronic lower respiratory diseases: Secondary | ICD-10-CM | POA: Diagnosis not present

## 2015-04-28 DIAGNOSIS — D61810 Antineoplastic chemotherapy induced pancytopenia: Secondary | ICD-10-CM | POA: Diagnosis present

## 2015-04-28 DIAGNOSIS — Y842 Radiological procedure and radiotherapy as the cause of abnormal reaction of the patient, or of later complication, without mention of misadventure at the time of the procedure: Secondary | ICD-10-CM | POA: Diagnosis present

## 2015-04-28 DIAGNOSIS — E860 Dehydration: Secondary | ICD-10-CM | POA: Diagnosis present

## 2015-04-28 DIAGNOSIS — K219 Gastro-esophageal reflux disease without esophagitis: Secondary | ICD-10-CM | POA: Diagnosis not present

## 2015-04-28 DIAGNOSIS — R0602 Shortness of breath: Secondary | ICD-10-CM | POA: Diagnosis present

## 2015-04-28 DIAGNOSIS — Z800 Family history of malignant neoplasm of digestive organs: Secondary | ICD-10-CM | POA: Diagnosis not present

## 2015-04-28 DIAGNOSIS — Z880 Allergy status to penicillin: Secondary | ICD-10-CM | POA: Diagnosis not present

## 2015-04-28 DIAGNOSIS — C73 Malignant neoplasm of thyroid gland: Secondary | ICD-10-CM | POA: Diagnosis present

## 2015-04-28 DIAGNOSIS — Z8249 Family history of ischemic heart disease and other diseases of the circulatory system: Secondary | ICD-10-CM | POA: Diagnosis not present

## 2015-04-28 DIAGNOSIS — Z923 Personal history of irradiation: Secondary | ICD-10-CM | POA: Diagnosis not present

## 2015-04-28 DIAGNOSIS — Y95 Nosocomial condition: Secondary | ICD-10-CM | POA: Diagnosis present

## 2015-04-28 DIAGNOSIS — E43 Unspecified severe protein-calorie malnutrition: Secondary | ICD-10-CM | POA: Diagnosis present

## 2015-04-28 DIAGNOSIS — Z6823 Body mass index (BMI) 23.0-23.9, adult: Secondary | ICD-10-CM | POA: Diagnosis not present

## 2015-04-28 DIAGNOSIS — J189 Pneumonia, unspecified organism: Secondary | ICD-10-CM | POA: Diagnosis not present

## 2015-04-28 DIAGNOSIS — C7931 Secondary malignant neoplasm of brain: Secondary | ICD-10-CM | POA: Diagnosis present

## 2015-04-28 DIAGNOSIS — B370 Candidal stomatitis: Secondary | ICD-10-CM | POA: Diagnosis present

## 2015-04-28 DIAGNOSIS — T451X5A Adverse effect of antineoplastic and immunosuppressive drugs, initial encounter: Secondary | ICD-10-CM | POA: Diagnosis present

## 2015-04-28 LAB — URINALYSIS, ROUTINE W REFLEX MICROSCOPIC
BILIRUBIN URINE: NEGATIVE
Glucose, UA: NEGATIVE mg/dL
Hgb urine dipstick: NEGATIVE
Ketones, ur: NEGATIVE mg/dL
NITRITE: NEGATIVE
Protein, ur: NEGATIVE mg/dL
SPECIFIC GRAVITY, URINE: 1.044 — AB (ref 1.005–1.030)
pH: 5 (ref 5.0–8.0)

## 2015-04-28 LAB — URINE MICROSCOPIC-ADD ON

## 2015-04-28 LAB — MAGNESIUM: MAGNESIUM: 1.6 mg/dL — AB (ref 1.7–2.4)

## 2015-04-28 LAB — CREATININE, SERUM
Creatinine, Ser: 0.49 mg/dL (ref 0.44–1.00)
GFR calc Af Amer: >60 mL/min (ref >60)
GFR calc non Af Amer: >60 mL/min (ref >60)

## 2015-04-28 LAB — STREP PNEUMONIAE URINARY ANTIGEN: STREP PNEUMO URINARY ANTIGEN: NEGATIVE

## 2015-04-28 LAB — EXPECTORATED SPUTUM ASSESSMENT W REFEX TO RESP CULTURE

## 2015-04-28 LAB — PHOSPHORUS: Phosphorus: 4.1 mg/dL (ref 2.5–4.6)

## 2015-04-28 LAB — HIV ANTIBODY (ROUTINE TESTING W REFLEX): HIV SCREEN 4TH GENERATION: NONREACTIVE

## 2015-04-28 LAB — D-DIMER, QUANTITATIVE: D-Dimer, Quant: 1.49 ug/mL-FEU — ABNORMAL HIGH (ref 0.00–0.50)

## 2015-04-28 MED ORDER — IOHEXOL 350 MG/ML SOLN
100.0000 mL | Freq: Once | INTRAVENOUS | Status: AC | PRN
  Administered 2015-04-28: 100 mL via INTRAVENOUS

## 2015-04-28 MED ORDER — CETYLPYRIDINIUM CHLORIDE 0.05 % MT LIQD
7.0000 mL | Freq: Two times a day (BID) | OROMUCOSAL | Status: DC
  Administered 2015-04-28 – 2015-05-02 (×9): 7 mL via OROMUCOSAL

## 2015-04-28 MED ORDER — STARCH (THICKENING) PO POWD: ORAL | Status: DC | PRN

## 2015-04-28 MED ORDER — RESOURCE THICKENUP CLEAR PO POWD
ORAL | Status: DC | PRN
  Filled 2015-04-28: qty 125

## 2015-04-28 NOTE — Progress Notes (Signed)
Initial Nutrition Assessment  DOCUMENTATION CODES:   Severe malnutrition in context of acute illness/injury  INTERVENTION:  - Continue Ensure Enlive po BID, each supplement provides 350 kcal and 20 grams of protein - Encourage PO intakes as pt is able - Continue nectar-thick liquids, and will add Resource Thicken Up, per request - RD will continue to monitor for needs  NUTRITION DIAGNOSIS:   Increased nutrient needs related to catabolic illness, cancer and cancer related treatments as evidenced by estimated needs.  GOAL:   Patient will meet greater than or equal to 90% of their needs  MONITOR:   PO intake, Supplement acceptance, Weight trends, Labs, I & O's  REASON FOR ASSESSMENT:   Malnutrition Screening Tool  ASSESSMENT:   60 year-old female with history of thyroid cancer, metastatic adenocarcinoma to brain who is undergoing chemotherapy at [facility]. His presenting with worsening weakness cough and shortness of breath. The problem has been present for the last 2 weeks and has progressively gotten worse. Patient reports coughing up phlegm which has been discolored. Patient denies coughing up any blood however. Given the persistence in problem patient presented to the ED for further evaluation recommendations.  Pt seen for MST. BMI indicates normal weight. Pt very solemn during RD visit and visitor (husband?) in the room does assist with answering some questions. Per chart review, pt ate 50% of lunch today. Per visitor, pt has oral thrush which is limiting intakes due to painful sensation. Pt denies any foods or drinks making this sensation worse than others. Pt is currently ordered Ensure Enlive BID and she states that PTA and prior to development of thrush she was drinking 1-2 bottles of Ensure/day. She has had some taste alteration, not liking foods that were once favorite foods, but is unable to give further detail on this.  Visitor states that when pt was recently at [facility] she was told she was aspirating and that recommendation and order was placed for nectar-thick liquids. Pt and visitor would like this to continue during this admission; order for nectar-thick liquids is already in place. Will order thickener should pt receive thin liquids that then need thickening.   Per chart review, pt has lost 12 lbs (8% body weight) in the past 3 months which is significant for time frame. In addition, she has lost 6 lbs (4% body weight) in the past 13 days which is significant for time frame. Pt refused physical assessment; will attempt to complete on follow-up.   Not meeting needs. Medications reviewed. Labs reviewed; Mg: 1.6 mg/dL.   Diet Order:  Diet regular Room service appropriate?: Yes with Assist; Fluid consistency:: Nectar Thick  Skin:  Reviewed, no issues  Last BM:  1/23  Height:   Ht Readings from Last 1 Encounters:  04/27/15 '5\' 4"'$  (1.626 m)    Weight:   Wt Readings from Last 1 Encounters:  04/27/15 139 lb 5.3 oz (63.2 kg)    Ideal Body Weight:  54.54 kg (kg)  BMI:  Body mass index is 23.9 kg/(m^2).  Estimated Nutritional Needs:   Kcal:  1900-2100 (30-34 kcal/kg)  Protein:  75-85 grams  Fluid:  >/= 2.1 L/day  EDUCATION NEEDS:   No education needs identified at this time     Jarome Matin, RD, LDN Inpatient Clinical Dietitian Pager # [phone] After hours/weekend pager # [phone]

## 2015-04-28 NOTE — Progress Notes (Signed)
[facility] PROGRESS NOTE  Jules Vidovich GDJ:[id number] DOB: 08-24-1955 DOA: 04/27/2015 PCP: [name], MD  Assessment/Plan: Active Problems:  PNA (pneumonia) HCAP - Place routine pneumonia order set and cover for HCAP related organisms - Continue Aztreonam and vancomycin - No reports of hemoptysis but given adenocarcinoma history will obtain a d dimer. If elevated will obtain CT angiogram to rule out PE. Low index of suspicion as such will hold anticoagulation  Oral thrush - consult pharmacy to place on appropriate nystatin regimen  Metastatic adenocarcinoma to brain and thyroid cancer - Patient had difficulty with IV access as such unable to obtain CT angiogram of chest. Please see above. Burnis Medin have patient follow-up with oncologist after discharge for routine evaluation and recommendations. - Continue home pain medication regimen  GERD -Continue PPI  Hypothyroidism - Plan will be to continue Synthroid  Code Status: full Family Communication: no family  Disposition Plan: Pending improvement in respiratory condition   Consultants:  None  Procedures:  None  Antibiotics:  Aztreonam  Vancomycin  HPI/Subjective: Pt states she feels better  Objective: Filed Vitals:   04/28/15 0941 04/28/15 1406  BP: 110/95 116/75  Pulse: 112 105  Temp: 99 F (37.2 C) 98.9 F (37.2 C)  Resp:      Intake/Output Summary (Last 24 hours) at 04/28/15 1704 Last data filed at 04/28/15 1400  Gross per 24 hour  Intake 988.75 ml  Output   1350 ml  Net -361.25 ml   Filed Weights   04/27/15 2027  Weight: 63.2 kg (139 lb 5.3 oz)    Exam:   General:  Pt in nad, alert and awake  Cardiovascular: rrr, no rubs  Respiratory: no wheezes, equal chest rise, rhales  Abdomen: soft, nd, nt  Musculoskeletal: no cyanosis or clubbing   Data Reviewed: Basic Metabolic Panel:  Recent Labs Lab 04/22/15 0407 04/23/15 0444 04/23/15 0705 04/24/15 0502 04/25/15 0541  04/27/15 1456 04/27/15 2333  NA 145 144  --   --  145 142  --   K 3.3* 3.0*  --   --  2.9* 4.3  --   CL 112* 113*  --   --  116* 112*  --   CO2 22 22  --   --  20* 23  --   GLUCOSE 100* 99  --   --  97 77  --   BUN <5* <5*  --   --  <5* 6  --   CREATININE <0.30* <0.30*  --   --  0.40* 0.48 0.49  CALCIUM 8.4* 8.4*  --   --  8.4* 8.8*  --   MG  --   --  1.7 1.7  --   --  1.6*  PHOS  --   --   --   --   --   --  4.1   Liver Function Tests:  Recent Labs Lab 04/27/15 1456  AST 25  ALT 10*  ALKPHOS 66  BILITOT 0.9  PROT 5.6*  ALBUMIN 3.0*   No results for input(s): LIPASE, AMYLASE in the last 168 hours. No results for input(s): AMMONIA in the last 168 hours. CBC:  Recent Labs Lab 04/23/15 0444 04/24/15 0502 04/25/15 0541 04/27/15 1456 04/27/15 2333  WBC 3.5* 4.5 7.8 5.7 5.5  NEUTROABS  --   --   --  4.4  --   HGB 12.0 12.2 12.3 10.9* 11.6*  HCT 37.4 38.9 39.1 34.8* 36.1  MCV 94.4 95.3 96.3 95.6 94.8  PLT  130* 153 181 233 241   Cardiac Enzymes: No results for input(s): CKTOTAL, CKMB, CKMBINDEX, TROPONINI in the last 168 hours. BNP (last 3 results)  Recent Labs  03/15/15 2100 04/18/15 2031  BNP 81.0 42.0    ProBNP (last 3 results) No results for input(s): PROBNP in the last 8760 hours.  CBG: No results for input(s): GLUCAP in the last 168 hours.  Recent Results (from the past 240 hour(s))  Blood culture (routine x 2)     Status: None   Collection Time: 04/18/15  9:25 PM  Result Value Ref Range Status   Specimen Description BLOOD RIGHT ARM  Final   Special Requests BOTTLES DRAWN AEROBIC AND ANAEROBIC 4CC  Final   Culture NO GROWTH 5 DAYS  Final   Report Status 04/23/2015 FINAL  Final  Urine culture     Status: None   Collection Time: 04/18/15 10:30 PM  Result Value Ref Range Status   Specimen Description URINE, CATHETERIZED  Final   Special Requests NONE  Final   Culture   Final    NO GROWTH 2 DAYS Performed at [facility]    Report Status  04/21/2015 FINAL  Final  Blood culture (routine x 2)     Status: None   Collection Time: 04/18/15 11:00 PM  Result Value Ref Range Status   Specimen Description BLOOD RIGHT HAND  Final   Special Requests BOTTLES DRAWN AEROBIC ONLY 2CC  Final   Culture NO GROWTH 5 DAYS  Final   Report Status 04/23/2015 FINAL  Final  MRSA PCR Screening     Status: None   Collection Time: 04/19/15  1:50 AM  Result Value Ref Range Status   MRSA by PCR NEGATIVE NEGATIVE Final    Comment:        The GeneXpert MRSA Assay (FDA approved for NASAL specimens only), is one component of a comprehensive MRSA colonization surveillance program. It is not intended to diagnose MRSA infection nor to guide or monitor treatment for MRSA infections.   Culture, respiratory (NON-Expectorated)     Status: None   Collection Time: 04/20/15 10:20 AM  Result Value Ref Range Status   Specimen Description SPUTUM  Final   Special Requests NONE  Final   Gram Stain   Final    ABUNDANT WBC PRESENT,BOTH PMN AND MONONUCLEAR FEW SQUAMOUS EPITHELIAL CELLS PRESENT MODERATE GRAM POSITIVE COCCI IN PAIRS IN CLUSTERS MODERATE GRAM POSITIVE RODS Performed at [facility]    Culture   Final    NORMAL OROPHARYNGEAL FLORA Performed at [facility]    Report Status 04/24/2015 FINAL  Final     Studies: Dg Chest Portable 1 View  04/27/2015  CLINICAL DATA:  Shortness of breath and weakness today. Patient diagnosed with bilateral pneumonia 04/25/2015. EXAM: PORTABLE CHEST 1 VIEW COMPARISON:  04/18/2015 FINDINGS: Cardiomediastinal silhouette is normal. Mediastinal contours appear intact. There is no evidence of pleural effusion or pneumothorax. Bilateral lower lobe linear and patchy airspace consolidation has slightly worsened. Osseous structures are without acute abnormality. Soft tissues are grossly normal. IMPRESSION: Bilateral lower lobe patchy and peribronchovascular airspace consolidation, likely representing multifocal  pneumonia. Electronically Signed   By: Fidela Salisbury M.D.   On: 04/27/2015 13:54    Scheduled Meds: . amitriptyline  25 mg Oral QHS  . antiseptic oral rinse  7 mL Mouth Rinse BID  . aztreonam  2 g Intravenous Q8H  . feeding supplement (ENSURE ENLIVE)  237 mL Oral BID BM  . heparin  5,000 Units Subcutaneous 3 times per day  . levothyroxine  175 mcg Oral QAC breakfast  . nystatin  5 mL Mouth/Throat QID  . pantoprazole  40 mg Oral BID  . SONAFINE  1 application Topical Daily  . sucralfate  1 g Oral TID WC & HS  . valACYclovir  1,000 mg Oral Daily  . vancomycin  500 mg Intravenous Q12H   Continuous Infusions: . sodium chloride 75 mL/hr at 04/28/15 1541     Time spent: > 35 minutes    Velvet Bathe  [facility] Pager [phone] If 7PM-7AM, please contact night-coverage at [email or web address], password [facility] 04/28/2015, 5:04 PM  LOS: 0 days

## 2015-04-29 ENCOUNTER — Inpatient Hospital Stay (HOSPITAL_COMMUNITY): Payer: Medicare Other

## 2015-04-29 LAB — LEGIONELLA ANTIGEN, URINE

## 2015-04-29 MED ORDER — OXYCODONE HCL 5 MG PO TABS
20.0000 mg | ORAL_TABLET | Freq: Once | ORAL | Status: AC
Start: 2015-04-29 — End: 2015-04-29
  Administered 2015-04-29: 20 mg via ORAL
  Filled 2015-04-29: qty 4

## 2015-04-29 NOTE — Procedures (Signed)
Objective Swallowing Evaluation: Type of Study: MBS-Modified Barium Swallow Study  Patient Details  Name: Haley Lowe MRN: [id number] Date of Birth: 09/25/1955  Today's Date: 04/29/2015 Time: SLP Start Time (ACUTE ONLY): 1215-SLP Stop Time (ACUTE ONLY): 1240 SLP Time Calculation (min) (ACUTE ONLY): 25 min  Past Medical History:  Past Medical History  Diagnosis Date  . Essential hypertension   . Anxiety   . Depression   . COPD (chronic obstructive pulmonary disease) ([place])   . GERD (gastroesophageal reflux disease)   . Headache(784.0)   . Arthritis   . Hx of adenomatous colonic polyps   . Emphysema   . Cataract     1 lens implaqnt right eye,intact on left eye  . Goiter   . Fibromyalgia   . Pulmonary emboli ([place]) 11/04/11    Right upper lobe and r lower lobe PE  . Lung nodule seen on imaging study 11/04/11 CT    42m LLL  . DVT (deep venous thrombosis) ([facility] 12/09/2011  . Pulmonary embolism ([facility] 12/09/2011  . Hyperlipidemia   . History of radiation therapy 10/06/11    SRS 15Gy 115f brain  . UTI (lower urinary tract infection)   . Borderline diabetes mellitus   . Metastatic adenocarcinoma to brain ([facility]/[place]13    Left frontoparietal region  . Thyroid cancer ([facility]  . S/P radiation therapy 05/31/14 SRS    Brain  . PONV (postoperative nausea and vomiting)   . Controlled diabetes mellitus type II without complication ([facility][id number]  Previously diagnosed with borderline diabetes.  . Marland Kitchenerpes simplex esophagitis 03/11/2015    Also candidal esophagitis  . HCAP (healthcare-associated pneumonia) 03/03/2015  . Mucositis due to radiation therapy 03/03/2015   Past Surgical History:  Past Surgical History  Procedure Laterality Date  . Abdominal hysterectomy  1996  . Cholecystectomy  2000  . Shoulder surgery Right 1998  . Eye surgery  2001  . Foot surgery Left 2005  . Craniotomy  09/15/2011    Procedure: CRANIOTOMY TUMOR EXCISION;  Surgeon: RoHosie SpangleMD;  [facility];  Service: Neurosurgery;  Laterality: N/A;  Craniotomy resection of tumor with stealth  . Cataract extraction  2011    With lens implant  . [facility]-2013    [facility]    . Video bronchoscopy with endobronchial ultrasound N/A 04/11/2014    Procedure: VIDEO BRONCHOSCOPY WITH ENDOBRONCHIAL ULTRASOUND;  Surgeon: StMelrose NakayamaMD;  [facility] Service: Thoracic;  Laterality: N/A;  . Esophagogastroduodenoscopy (egd) with propofol N/A 03/07/2015    Procedure: ESOPHAGOGASTRODUODENOSCOPY (EGD) WITH PROPOFOL;  Surgeon: RoDaneil DolinMD;  [facility];  Service: Endoscopy;  Laterality: N/A;  . Esophageal biopsy  03/07/2015    Procedure: BIOPSY;  Surgeon: RoDaneil DolinMD;  [facility];  Service: Endoscopy;;   HPI: [id number] old female with history of thyroid cancer, metastatic adenocarcinoma to brain, [facility].  Pt recently treated for pna and released home but returned due to ongoing symptoms.   Patient had chemotherapy and radiation in November 2016 at that time she was treated for shingles and pneumonia per chart review.  CT scan still showed pneumonia early January.  Spouse and pt report plans for repeat testing to determine if pt needs more chemo and radiation.  Pt has h/o esophagitis/mucositis and thrush that contribute to her dysphagia.  SLP asked to evaluate swallow.  Subjective: none stated  Assessment / Plan / Recommendation  CHL IP CLINICAL IMPRESSIONS 04/29/2015  Therapy Diagnosis Moderate oral phase dysphagia;Mild oral phase dysphagia;Mild pharyngeal phase dysphagia  Clinical Impression Mild=moderate oropharyngeal dysphagia WITHOUT aspiration of any consistency tested.  Pt noted to cough several times during MBS without barium visualized in trachea/larynx.  Delayed oral transiting with lingual  pumping noted with mild tongue base residuals without pt awareness.  Pharyngeal swallow characterized by decreased tongue base retraction resulting in mild vallecular/tongue base residual.  Liquid washes aid clearance of pudding/cracker and CUED dry swallows aid clearance of pharynx.  Recommend advance diet to allow thin liquids with strict aspiration precautions.  Using teach back, live video, educated pt and spouse to findings/recommendations.  SLP to follow up for dysphagia management.   Impact on safety and function Moderate aspiration risk      CHL IP TREATMENT RECOMMENDATION 04/29/2015  Treatment Recommendations Therapy as outlined in treatment plan below     Prognosis 04/29/2015  Prognosis for Safe Diet Advancement Guarded  Barriers to Reach Goals Cognitive deficits;Severity of deficits  Barriers/Prognosis Comment --    CHL IP DIET RECOMMENDATION 04/29/2015  SLP Diet Recommendations Regular solids;Thin liquid  Liquid Administration via --  Medication Administration Crushed with puree  Compensations Slow rate;Small sips/bites;Multiple dry swallows after each bite/sip;Clear throat intermittently;Follow solids with liquid  Postural Changes Seated upright at 90 degrees;Remain semi-upright after after feeds/meals (Comment)      CHL IP OTHER RECOMMENDATIONS 04/29/2015  Recommended Consults --  Oral Care Recommendations Oral care BID  Other Recommendations Have oral suction available;Clarify dietary restrictions      CHL IP FOLLOW UP RECOMMENDATIONS 04/29/2015  Follow up Recommendations 24 hour supervision/assistance      CHL IP FREQUENCY AND DURATION 04/29/2015  Speech Therapy Frequency (ACUTE ONLY) min 2x/week  Treatment Duration 1 week           CHL IP ORAL PHASE 04/29/2015  Oral Phase Impaired  Oral - Pudding Teaspoon --  Oral - Pudding Cup --  Oral - Honey Teaspoon --  Oral - Honey Cup --  Oral - Nectar Teaspoon Weak lingual manipulation;Lingual pumping;Reduced posterior  propulsion;Delayed oral transit  Oral - Nectar Cup Weak lingual manipulation;Reduced posterior propulsion;Piecemeal swallowing;Delayed oral transit  Oral - Nectar Straw --  Oral - Thin Teaspoon Delayed oral transit;Weak lingual manipulation;Reduced posterior propulsion;Lingual/palatal residue;Lingual pumping  Oral - Thin Cup Lingual pumping;Weak lingual manipulation;Delayed oral transit;Reduced posterior propulsion;Piecemeal swallowing  Oral - Thin Straw --  Oral - Puree Lingual pumping;Weak lingual manipulation;Reduced posterior propulsion  Oral - Mech Soft Lingual pumping;Weak lingual manipulation;Reduced posterior propulsion;Impaired mastication  Oral - Regular --  Oral - Multi-Consistency --  Oral - Pill Lingual pumping;Other (Comment)  Oral Phase - Comment pt extended head upward with nectar, ? to aid oral transiting, tongue base residuals without awareness     CHL IP PHARYNGEAL PHASE 04/29/2015  Pharyngeal Phase Impaired  Pharyngeal- Pudding Teaspoon --  Pharyngeal --  Pharyngeal- Pudding Cup --  Pharyngeal --  Pharyngeal- Honey Teaspoon --  Pharyngeal --  Pharyngeal- Honey Cup --  Pharyngeal --  Pharyngeal- Nectar Teaspoon Reduced tongue base retraction  Pharyngeal --  Pharyngeal- Nectar Cup Reduced tongue base retraction;Delayed swallow initiation-pyriform sinuses  Pharyngeal --  Pharyngeal- Nectar Straw --  Pharyngeal --  Pharyngeal- Thin Teaspoon Reduced tongue base retraction  Pharyngeal --  Pharyngeal- Thin Cup Reduced tongue base retraction;Delayed swallow initiation-pyriform sinuses;Penetration/Aspiration during swallow  Pharyngeal Material enters airway, remains ABOVE  vocal cords then ejected out  Pharyngeal- Thin Straw Reduced tongue base retraction;Pharyngeal residue - valleculae  Pharyngeal --  Pharyngeal- Puree Reduced tongue base retraction;Pharyngeal residue - valleculae  Pharyngeal --  Pharyngeal- Mechanical Soft Reduced tongue base retraction;Pharyngeal  residue - valleculae  Pharyngeal --  Pharyngeal- Regular --  Pharyngeal --  Pharyngeal- Multi-consistency --  Pharyngeal --  Pharyngeal- Pill Pharyngeal residue - valleculae  Pharyngeal --  Pharyngeal Comment pt with mild tongue base/vallecular residuals without awareness, following solids with liquids. dry swallows cued helpful      CHL IP CERVICAL ESOPHAGEAL PHASE 04/29/2015  Cervical Esophageal Phase Impaired  Pudding Teaspoon --  Pudding Cup --  Honey Teaspoon --  Honey Cup --  Nectar Teaspoon --  Nectar Cup --  Nectar Straw --  Thin Teaspoon --  Thin Cup --  Thin Straw --  Puree --  Mechanical Soft --  Regular --  Multi-consistency --  Pill --  Cervical Esophageal Comment appearance of slow clearance of pudding below UES without backflow; liquids appeared to facilitate clearance  upon esophageal sweep - pt appeared with coating of barium throughout the esophagus- ? if due to esophagitis/candidiasis     No flowsheet data found.  Luanna Salk, [facility] SLP [phone]

## 2015-04-29 NOTE — Progress Notes (Signed)
[facility] PROGRESS NOTE  Haley Lowe YQI:[id number] DOB: 18-May-1955 DOA: 04/27/2015 PCP: [name], MD  Brief Narrative Patient is a 60 year old with history of metastatic adenocarcinoma to brain and thyroid cancer who is currently the and seen for hospital-acquired pneumonia. She has allergy to penicillin since currently on aztreonam and vancomycin. D-dimer elevated CT angiogram of chest reported no PE  Assessment/Plan: Active Problems:  PNA (pneumonia) HCAP - Place routine pneumonia order set and cover for HCAP related organisms - Continue Aztreonam and vancomycin - CT angiogram of chest reported no PE  Oral thrush - consult pharmacy to place on appropriate nystatin regimen  Metastatic adenocarcinoma to brain and thyroid cancer - Patient had difficulty with IV access as such unable to obtain CT angiogram of chest. Please see above. Burnis Medin have patient follow-up with oncologist after discharge for routine evaluation and recommendations. - Continue home pain medication regimen  GERD -Continue PPI  Hypothyroidism - Plan will be to continue Synthroid  Code Status: full Family Communication: no family  Disposition Plan: Pending improvement in respiratory condition   Consultants:  None  Procedures:  CT angiogram of chest  Antibiotics:  Aztreonam  Vancomycin  HPI/Subjective: Pt states she feels better.   Objective: Filed Vitals:   04/28/15 2301 04/29/15 0622  BP: 131/81 121/80  Pulse: 95 97  Temp: 98.4 F (36.9 C) 98.4 F (36.9 C)  Resp: 18 18    Intake/Output Summary (Last 24 hours) at 04/29/15 1503 Last data filed at 04/29/15 1420  Gross per 24 hour  Intake   1000 ml  Output   2000 ml  Net  -1000 ml   Filed Weights   04/27/15 2027  Weight: 63.2 kg (139 lb 5.3 oz)    Exam:   General:  Pt in nad, alert and awake  Cardiovascular: rrr, no rubs  Respiratory: no wheezes, equal chest rise, rhales  Abdomen: soft, nd,  nt  Musculoskeletal: no cyanosis or clubbing   Data Reviewed: Basic Metabolic Panel:  Recent Labs Lab 04/23/15 0444 04/23/15 0705 04/24/15 0502 04/25/15 0541 04/27/15 1456 04/27/15 2333  NA 144  --   --  145 142  --   K 3.0*  --   --  2.9* 4.3  --   CL 113*  --   --  116* 112*  --   CO2 22  --   --  20* 23  --   GLUCOSE 99  --   --  97 77  --   BUN <5*  --   --  <5* 6  --   CREATININE <0.30*  --   --  0.40* 0.48 0.49  CALCIUM 8.4*  --   --  8.4* 8.8*  --   MG  --  1.7 1.7  --   --  1.6*  PHOS  --   --   --   --   --  4.1   Liver Function Tests:  Recent Labs Lab 04/27/15 1456  AST 25  ALT 10*  ALKPHOS 66  BILITOT 0.9  PROT 5.6*  ALBUMIN 3.0*   No results for input(s): LIPASE, AMYLASE in the last 168 hours. No results for input(s): AMMONIA in the last 168 hours. CBC:  Recent Labs Lab 04/23/15 0444 04/24/15 0502 04/25/15 0541 04/27/15 1456 04/27/15 2333  WBC 3.5* 4.5 7.8 5.7 5.5  NEUTROABS  --   --   --  4.4  --   HGB 12.0 12.2 12.3 10.9* 11.6*  HCT 37.4 38.9  39.1 34.8* 36.1  MCV 94.4 95.3 96.3 95.6 94.8  PLT 130* 153 181 233 241   Cardiac Enzymes: No results for input(s): CKTOTAL, CKMB, CKMBINDEX, TROPONINI in the last 168 hours. BNP (last 3 results)  Recent Labs  03/15/15 2100 04/18/15 2031  BNP 81.0 42.0    ProBNP (last 3 results) No results for input(s): PROBNP in the last 8760 hours.  CBG: No results for input(s): GLUCAP in the last 168 hours.  Recent Results (from the past 240 hour(s))  Culture, respiratory (NON-Expectorated)     Status: None   Collection Time: 04/20/15 10:20 AM  Result Value Ref Range Status   Specimen Description SPUTUM  Final   Special Requests NONE  Final   Gram Stain   Final    ABUNDANT WBC PRESENT,BOTH PMN AND MONONUCLEAR FEW SQUAMOUS EPITHELIAL CELLS PRESENT MODERATE GRAM POSITIVE COCCI IN PAIRS IN CLUSTERS MODERATE GRAM POSITIVE RODS Performed at [facility]    Culture   Final    NORMAL  OROPHARYNGEAL FLORA Performed at [facility]    Report Status 04/24/2015 FINAL  Final  Culture, blood (routine x 2) Call MD if unable to obtain prior to antibiotics being given     Status: None (Preliminary result)   Collection Time: 04/27/15 11:33 PM  Result Value Ref Range Status   Specimen Description BLOOD LEFT HAND  Final   Special Requests BOTTLES DRAWN AEROBIC ONLY 5CC  Final   Culture   Final    NO GROWTH 1 DAY Performed at [facility]    Report Status PENDING  Incomplete  Culture, sputum-assessment     Status: None   Collection Time: 04/28/15  7:45 PM  Result Value Ref Range Status   Specimen Description SPUTUM  Final   Special Requests Immunocompromised  Final   Sputum evaluation   Final    MICROSCOPIC FINDINGS SUGGEST THAT THIS SPECIMEN IS NOT REPRESENTATIVE OF LOWER RESPIRATORY SECRETIONS. PLEASE RECOLLECT. [facility] RN [phone] [place],N    Report Status 04/28/2015 FINAL  Final     Studies: Ct Angio Chest Pe W/cm &/or Wo Cm  04/28/2015  CLINICAL DATA:  Two week history progressively worsening with weakness, cough, and shortness of breath. History of thyroid carcinoma and metastatic adenocarcinoma to brain undergoing chemotherapy at [place]: CT ANGIOGRAPHY CHEST WITH CONTRAST TECHNIQUE: Multidetector CT imaging of the chest was performed using the standard protocol during bolus administration of intravenous contrast. Multiplanar CT image reconstructions and MIPs were obtained to evaluate the vascular anatomy. CONTRAST:  190m OMNIPAQUE IOHEXOL 350 MG/ML SOLN COMPARISON:  03/15/2015 FINDINGS: Technically adequate study with good opacification of the central and segmental pulmonary arteries. No focal filling defects demonstrated. No evidence of significant pulmonary embolus. Normal heart size. Small pericardial effusion. Normal caliber thoracic aorta without aneurysm or dissection. Aortic calcification. Great vessel origins are patent. Surgical  absence of the thyroid gland. Esophagus is decompressed. No significant hilar, mediastinal, or axillary lymphadenopathy. Mild prominence of retrocrural lymph nodes, measuring up to about 9 mm short axis dimension. These are nonspecific and could represent reactive or metastatic nodes. Retrocrural nodes are slightly more prominent on previous study. Emphysematous changes in the lungs. Diffuse bronchial thickening and basilar mucous plugging suggesting chronic bronchitis. Patchy airspace infiltrates in the lung bases with more diffuse tree-in-bud infiltrates throughout the lower lungs. Scattered tiny pulmonary nodules. Changes are likely to represent inflammatory process such as bronchopneumonia. Given the history, atypical infection or interstitial metastatic disease are not entirely excluded and  follow-up is suggested. Appearances have progressed since the previous study. No pleural effusions. No pneumothorax. Included portions of the upper abdominal organs demonstrate diffuse fatty infiltration of the liver. Degenerative changes in the spine. No destructive bone lesions. Review of the MIP images confirms the above findings. IMPRESSION: No evidence of significant pulmonary embolus. Patchy airspace disease in the lung bases with diffuse nodular tree-in-bud pattern throughout the lungs likely representing endobronchial bacterial infection, although atypical infection (fungal or atypical mycobacterial) or interstitial metastatic disease not entirely excluded. Findings are progressing since previous study. Chronic bronchitic changes and emphysematous changes in the lungs. Small pericardial effusion. Diffuse fatty infiltration of the liver. Electronically Signed   By: Lucienne Capers M.D.   On: 04/28/2015 19:06   Dg Swallowing Func-speech Pathology  04/29/2015  Objective Swallowing Evaluation: Type of Study: MBS-Modified Barium Swallow Study Patient Details Name: Haley Lowe MRN: [id number] Date of Birth: September 24, 1955  Today's Date: 04/29/2015 Time: SLP Start Time (ACUTE ONLY): 1215-SLP Stop Time (ACUTE ONLY): 1240 SLP Time Calculation (min) (ACUTE ONLY): 25 min Past Medical History: Past Medical History Diagnosis Date . Essential hypertension  . Anxiety  . Depression  . COPD (chronic obstructive pulmonary disease) ([place])  . GERD (gastroesophageal reflux disease)  . Headache(784.0)  . Arthritis  . Hx of adenomatous colonic polyps  . Emphysema  . Cataract    1 lens implaqnt right eye,intact on left eye . Goiter  . Fibromyalgia  . Pulmonary emboli ([place]) 11/04/11   Right upper lobe and r lower lobe PE . Lung nodule seen on imaging study 11/04/11 CT   52m LLL . D[place]) [place] 12/09[place] . [place]ism [place] 12/09[place] . Hyperlipidemia  . History of radiation therapy 10/06/11   SRS 15Gy 132f brain . UTI (lower urinary tract infection)  . Borderline diabetes mellitus  . Metastatic adenocarcinoma to brain ([place]   Left frontoparietal region . Thyroid cancer [place] . S/P radiation therapy 05/31/14 SRS   Brain . PONV (postoperative nausea and vomiting)  . Controlled diabetes mellitus type II without complication ([id number] Previously diagnosed with borderline diabetes. . Marland Kitchenerpes simplex esophagitis 03/11/2015   Also candidal esophagitis . HCAP (healthcare-associated pneumonia) 03/03/2015 . Mucositis due to radiation therapy 03/03/2015 Past Surgical History: Past Surgical History Procedure Laterality Date . Abdominal hysterectomy  1996 . Cholecystectomy  2000 . Shoulder surgery Right 1998 . Eye surgery  2001 . Foot surgery Left 2005 . Craniotomy  09/15/2011   Procedure: CRANIOTOMY TUMOR EXCISION;  Surgeon: RoHosie SpangleMD;  [facility];  Service: Neurosurgery;  Laterality: N/A;  Craniotomy resection of tumor with stealth . Cataract extraction  2011   With lens implant . [facility]ide[facility]  [facility]-29-2013   [facility]   . Video bronchoscopy with  endobronchial ultrasound N/A 04/11/2014   Procedure: VIDEO BRONCHOSCOPY WITH ENDOBRONCHIAL ULTRASOUND;  Surgeon: StMelrose NakayamaMD;  [place] Service: Thoracic;  Laterality: N/A; . Esophagogastroduodenoscopy (egd) with propofol N/A 03/07/2015   Procedure: ESOPHAGOGASTRODUODENOSCOPY (EGD) WITH PROPOFOL;  Surgeon: RoDaneil DolinMD;  [facility];  Service: Endoscopy;  Laterality: N/A; . Esophageal biopsy  03/07/2015   Procedure: BIOPSY;  Surgeon: RoDaneil DolinMD;  [facility];  Service: Endoscopy;; HPI: [age]ear old female with history of thyroid cancer, metastatic adenocarcinoma to brain, who [place] [facility] [place].  Pt recently treated for pna and  released home but returned due to ongoing symptoms.   Patient had chemotherapy and radiation in November 2016 at that time she was treated for shingles and pneumonia per chart review.  CT scan still showed pneumonia early January.  Spouse and pt report plans for repeat testing to determine if pt needs more chemo and radiation.  Pt has h/o esophagitis/mucositis and thrush that contribute to her dysphagia.  SLP asked to evaluate swallow. Subjective: none stated Assessment / Plan / Recommendation CHL IP CLINICAL IMPRESSIONS 04/29/2015 Therapy Diagnosis Moderate oral phase dysphagia;Mild oral phase dysphagia;Mild pharyngeal phase dysphagia Clinical Impression Mild=moderate oropharyngeal dysphagia WITHOUT aspiration of any consistency tested.  Pt noted to cough several times during MBS without barium visualized in trachea/larynx.  Delayed oral transiting with lingual pumping noted with mild tongue base residuals without pt awareness.  Pharyngeal swallow characterized by decreased tongue base retraction resulting in mild vallecular/tongue base residual.  Liquid washes aid clearance of pudding/cracker and CUED dry swallows aid clearance of pharynx.  Recommend advance  diet to allow thin liquids with strict aspiration precautions.  Using teach back, live video, educated pt and spouse to findings/recommendations.  SLP to follow up for dysphagia management.  Impact on safety and function Moderate aspiration risk   CHL IP TREATMENT RECOMMENDATION 04/29/2015 Treatment Recommendations Therapy as outlined in treatment plan below   Prognosis 04/29/2015 Prognosis for Safe Diet Advancement Guarded Barriers to Reach Goals Cognitive deficits;Severity of deficits Barriers/Prognosis Comment -- CHL IP DIET RECOMMENDATION 04/29/2015 SLP Diet Recommendations Regular solids;Thin liquid Liquid Administration via -- Medication Administration Crushed with puree Compensations Slow rate;Small sips/bites;Multiple dry swallows after each bite/sip;Clear throat intermittently;Follow solids with liquid Postural Changes Seated upright at 90 degrees;Remain semi-upright after after feeds/meals (Comment)   CHL IP OTHER RECOMMENDATIONS 04/29/2015 Recommended Consults -- Oral Care Recommendations Oral care BID Other Recommendations Have oral suction available;Clarify dietary restrictions   CHL IP FOLLOW UP RECOMMENDATIONS 04/29/2015 Follow up Recommendations 24 hour supervision/assistance   CHL IP FREQUENCY AND DURATION 04/29/2015 Speech Therapy Frequency (ACUTE ONLY) min 2x/week Treatment Duration 1 week      CHL IP ORAL PHASE 04/29/2015 Oral Phase Impaired Oral - Pudding Teaspoon -- Oral - Pudding Cup -- Oral - Honey Teaspoon -- Oral - Honey Cup -- Oral - Nectar Teaspoon Weak lingual manipulation;Lingual pumping;Reduced posterior propulsion;Delayed oral transit Oral - Nectar Cup Weak lingual manipulation;Reduced posterior propulsion;Piecemeal swallowing;Delayed oral transit Oral - Nectar Straw -- Oral - Thin Teaspoon Delayed oral transit;Weak lingual manipulation;Reduced posterior propulsion;Lingual/palatal residue;Lingual pumping Oral - Thin Cup Lingual pumping;Weak lingual manipulation;Delayed oral transit;Reduced  posterior propulsion;Piecemeal swallowing Oral - Thin Straw -- Oral - Puree Lingual pumping;Weak lingual manipulation;Reduced posterior propulsion Oral - Mech Soft Lingual pumping;Weak lingual manipulation;Reduced posterior propulsion;Impaired mastication Oral - Regular -- Oral - Multi-Consistency -- Oral - Pill Lingual pumping;Other (Comment) Oral Phase - Comment pt extended head upward with nectar, ? to aid oral transiting, tongue base residuals without awareness   CHL IP PHARYNGEAL PHASE 04/29/2015 Pharyngeal Phase Impaired Pharyngeal- Pudding Teaspoon -- Pharyngeal -- Pharyngeal- Pudding Cup -- Pharyngeal -- Pharyngeal- Honey Teaspoon -- Pharyngeal -- Pharyngeal- Honey Cup -- Pharyngeal -- Pharyngeal- Nectar Teaspoon Reduced tongue base retraction Pharyngeal -- Pharyngeal- Nectar Cup Reduced tongue base retraction;Delayed swallow initiation-pyriform sinuses Pharyngeal -- Pharyngeal- Nectar Straw -- Pharyngeal -- Pharyngeal- Thin Teaspoon Reduced tongue base retraction Pharyngeal -- Pharyngeal- Thin Cup Reduced tongue base retraction;Delayed swallow initiation-pyriform sinuses;Penetration/Aspiration during swallow Pharyngeal Material enters airway, remains ABOVE vocal cords then ejected out Pharyngeal- Thin Straw Reduced tongue base  retraction;Pharyngeal residue - valleculae Pharyngeal -- Pharyngeal- Puree Reduced tongue base retraction;Pharyngeal residue - valleculae Pharyngeal -- Pharyngeal- Mechanical Soft Reduced tongue base retraction;Pharyngeal residue - valleculae Pharyngeal -- Pharyngeal- Regular -- Pharyngeal -- Pharyngeal- Multi-consistency -- Pharyngeal -- Pharyngeal- Pill Pharyngeal residue - valleculae Pharyngeal -- Pharyngeal Comment pt with mild tongue base/vallecular residuals without awareness, following solids with liquids. dry swallows cued helpful   CHL IP CERVICAL ESOPHAGEAL PHASE 04/29/2015 Cervical Esophageal Phase Impaired Pudding Teaspoon -- Pudding Cup -- Honey Teaspoon -- Honey Cup --  Nectar Teaspoon -- Nectar Cup -- Nectar Straw -- Thin Teaspoon -- Thin Cup -- Thin Straw -- Puree -- Mechanical Soft -- Regular -- Multi-consistency -- Pill -- Cervical Esophageal Comment appearance of slow clearance of pudding below UES without backflow; liquids appeared to facilitate clearance  upon esophageal sweep - pt appeared with coating of barium throughout the esophagus- ? if due to esophagitis/candidiasis  No flowsheet data found. Janett Labella [place], [place] [facility] SLP [phone]               Scheduled Meds: . amitriptyline  25 mg Oral QHS  . antiseptic oral rinse  7 mL Mouth Rinse BID  . aztreonam  2 g Intravenous Q8H  . feeding supplement (ENSURE ENLIVE)  237 mL Oral BID BM  . heparin  5,000 Units Subcutaneous 3 times per day  . levothyroxine  175 mcg Oral QAC breakfast  . nystatin  5 mL Mouth/Throat QID  . pantoprazole  40 mg Oral BID  . SONAFINE  1 application Topical Daily  . sucralfate  1 g Oral TID WC & HS  . valACYclovir  1,000 mg Oral Daily  . vancomycin  500 mg Intravenous Q12H   Continuous Infusions: . sodium chloride 75 mL/hr at 04/29/15 0949     Time spent: > 35 minutes    Velvet Bathe  [facility] Pager [phone] If 7PM-7AM, please contact night-coverage at [email or web address], password [facility] 04/29/2015, 3:03 PM  LOS: 1 day

## 2015-04-29 NOTE — Evaluation (Signed)
Clinical/Bedside Swallow Evaluation Patient Details  Name: Haley Lowe MRN: [id number] Date of Birth: 05/02/1955  Today's Date: 04/29/2015 Time: SLP Start Time (ACUTE ONLY): 1045 SLP Stop Time (ACUTE ONLY): 1125 SLP Time Calculation (min) (ACUTE ONLY): 40 min  Past Medical History:  Past Medical History  Diagnosis Date  . Essential hypertension   . Anxiety   . Depression   . COPD (chronic obstructive pulmonary disease) ([facility])   . GERD (gastroesophageal reflux disease)   . Headache(784.0)   . Arthritis   . Hx of adenomatous colonic polyps   . Emphysema   . Cataract     1 lens implaqnt right eye,intact on left eye  . Goiter   . Fibromyalgia   . Pulmonary emboli ([place]) 11/04/11    Right upper lobe and r lower lobe PE  . Lung nodule seen on imaging study 11/04/11 CT    96m LLL  . DVT (deep venous thrombosis) ([facility] 12/09/2011  . Pulmonary embolism ([facility] 12/09/2011  . Hyperlipidemia   . History of radiation therapy 10/06/11    SRS 15Gy 128f brain  . UTI (lower urinary tract infection)   . Borderline diabetes mellitus   . Metastatic adenocarcinoma to brain ([facility]/12/13    Left frontoparietal region  . Thyroid cancer ([facility]  . S/P radiation therapy 05/31/14 SRS    Brain  . PONV (postoperative nausea and vomiting)   . Controlled diabetes mellitus type II without complication ([facility]/04/6008  Previously diagnosed with borderline diabetes.  . Marland Kitchenerpes simplex esophagitis 03/11/2015    Also candidal esophagitis  . HCAP (healthcare-associated pneumonia) 03/03/2015  . Mucositis due to radiation therapy 03/03/2015   Past Surgical History:  Past Surgical History  Procedure Laterality Date  . Abdominal hysterectomy  1996  . Cholecystectomy  2000  . Shoulder surgery Right 1998  . Eye surgery  2001  . Foot surgery Left 2005  . Craniotomy  09/15/2011    Procedure: CRANIOTOMY TUMOR EXCISION;  Surgeon: RoHosie SpangleMD;  [facility];  Service: Neurosurgery;  Laterality: N/A;   Craniotomy resection of tumor with stealth  . Cataract extraction  2011    With lens implant  . [facility]  [facility]29-2013    [facility]    . Video bronchoscopy with endobronchial ultrasound N/A 04/11/2014    Procedure: VIDEO BRONCHOSCOPY WITH ENDOBRONCHIAL ULTRASOUND;  Surgeon: StMelrose NakayamaMD;  [facility]: Thor[facility];  Laterality: N/A;  . Esophagogastroduodenoscopy (egd) with propofol N/A 03/07/2015    Procedure: ESOPHAGOGASTRODUODENOSCOPY (EGD) WITH PROPOFOL;  Surgeon: RoDaneil DolinMD;  Location: [facility];  Service: Endoscopy;  Laterality: N/A;  . Esophageal biopsy  03/07/2015    Procedure: BIOPSY;  Surgeon: RoDaneil DolinMD;  Location[facility];  Service: Endoscopy;;   HPI:  [age]ear old female with history of thyroid cancer, metastatic adenocarcinoma to brain, [facility].  Pt recently treated for pna and released home but returned due to ongoing symptoms.   Patient had chemotherapy and radiation in November 2016 at that time she was treated for shingles and pneumonia per chart review.  CT scan still showed pneumonia early January.  Spouse and pt report plans for repeat testing to determine if pt needs more chemo and radiation.  Pt has h/o esophagitis/mucositis and thrush that contribute to her dysphagia.  SLP asked to evaluate swallow.  Assessment / Plan / Recommendation Clinical Impression  Pt continues to demonstrate symptoms consistent with possible/probable oropharyngeal dysphagia likely due to thrush, mucositis, esophagitis and effects of radiation treatment.  Pt does not recall compensation strategies reviewed during prior hospitalization, therefore complaince is not occuring.  Baseline congested, weak cough noted with very hoarse vocal quality.  Pt admits to occasionally coughing/choking with eating on liquids  more than foods.  Goals recited by pt are to spend as much time with family as possible, spouse reports desire for pt to get over pna.  Delayed cough noted across all consistencies provided, therefore can not rule in or out overt aspiration.  Given ongoing concerns for pna and suspected aspiration, will proceed with MBS to allow instrumental evaluation.  Advised pt and spouse to role of mitigating aspiration = not preventing 100%.  Reviewed prior testing outcomes and recommendations with pt.  Pt denies improved swallow ability - less coughing- with nectar vs thin liquids and spouse unaware of if helpful per his statement.     Aspiration Risk  Moderate aspiration risk;Severe aspiration risk    Diet Recommendation Dysphagia 2 (Fine chop);Nectar-thick liquid   Liquid Administration via: Cup;Straw Medication Administration: Crushed with puree Supervision: Staff to assist with self feeding;Full supervision/cueing for compensatory strategies Compensations: Slow rate;Small sips/bites;Multiple dry swallows after each bite/sip;Clear throat intermittently Postural Changes: Seated upright at 90 degrees;Remain upright for at least 30 minutes after po intake    Other  Recommendations Oral Care Recommendations: Oral care BID;Oral care prior to ice chip/H20;Staff/trained caregiver to provide oral care Other Recommendations: Order thickener from pharmacy;Have oral suction available;Remove water pitcher;Clarify dietary restrictions   Follow up Recommendations  24 hour supervision/assistance    Frequency and Duration min 2x/week  1 week       Prognosis Prognosis for Safe Diet Advancement: Guarded Barriers to Reach Goals: Cognitive deficits;Severity of deficits      Swallow Study   General Date of Onset: 02/27/15 HPI: 60 year old female with history of thyroid cancer, metastatic adenocarcinoma to brain, who is currently undergoing chemotherapy at [place]. Pt continues with increased  coughing/weakness.  Pt recently treated for pna and released home but returned due to ongoing symptoms.   Patient had chemotherapy and radiation in November 2016 at that time she was treated for shingles and pneumonia per chart review.  CT scan still showed pneumonia early January.  Spouse and pt report plans for repeat testing to determine if pt needs more chemo and radiation.  Pt has h/o esophagitis/mucositis and thrush that contribute to her dysphagia.  SLP asked to evaluate swallow. Type of Study: Bedside Swallow Evaluation Previous Swallow Assessment: December 2016 with rec for D3/thin; clear throat with thins- esophagram silent penetration of liquids, pt is s/p EGD Dec 2016 w/ proximal dilatation without significant improvement  Respiratory Status: Nasal cannula (pt continually removes nasal cannula) History of Recent Intubation: No Behavior/Cognition: Alert;Cooperative;Confused Oral Cavity Assessment: Dried secretions (xerostomic) Oral Care Completed by SLP: No Oral Cavity - Dentition: Adequate natural dentition Vision: Functional for self-feeding Self-Feeding Abilities: Able to feed self Patient Positioning: Upright in bed Baseline Vocal Quality: Breathy;Hoarse;Low vocal intensity Volitional Cough: Weak;Congested Volitional Swallow: Able to elicit    Oral/Motor/Sensory Function Overall Oral Motor/Sensory Function: Generalized oral weakness (generally weak)   Ice Chips Ice chips: Not tested   Thin Liquid Thin Liquid: Impaired Presentation: Spoon;Cup Oral Phase Functional Implications: Oral holding Pharyngeal  Phase Impairments: Suspected delayed Swallow;Cough - Delayed    Nectar Thick Nectar Thick Liquid: Impaired Presentation:  Cup Oral phase functional implications: Oral holding Pharyngeal Phase Impairments: Suspected delayed Swallow;Cough - Delayed   Honey Thick Honey Thick Liquid: Not tested   Puree Puree: Impaired Presentation: Spoon Oral Phase Functional Implications:  Prolonged oral transit Pharyngeal Phase Impairments: Suspected delayed Swallow;Cough - Delayed   Solid   GO   Solid: Not tested       Luanna Salk, [facility] SLP [phone]

## 2015-04-30 LAB — CREATININE, SERUM: Creatinine, Ser: 0.4 mg/dL — ABNORMAL LOW (ref 0.44–1.00)

## 2015-04-30 LAB — VANCOMYCIN, TROUGH: VANCOMYCIN TR: 7 ug/mL — AB (ref 10.0–20.0)

## 2015-04-30 MED ORDER — BENZONATATE 100 MG PO CAPS
100.0000 mg | ORAL_CAPSULE | Freq: Three times a day (TID) | ORAL | Status: DC | PRN
  Administered 2015-04-30 – 2015-05-02 (×5): 100 mg via ORAL
  Filled 2015-04-30 (×5): qty 1

## 2015-04-30 MED ORDER — ACETAMINOPHEN 325 MG PO TABS
650.0000 mg | ORAL_TABLET | Freq: Four times a day (QID) | ORAL | Status: DC | PRN
  Administered 2015-04-30 – 2015-05-02 (×3): 650 mg via ORAL
  Filled 2015-04-30 (×3): qty 2

## 2015-04-30 MED ORDER — VANCOMYCIN HCL 500 MG IV SOLR
500.0000 mg | Freq: Three times a day (TID) | INTRAVENOUS | Status: DC
  Administered 2015-05-01: 500 mg via INTRAVENOUS
  Filled 2015-04-30 (×2): qty 500

## 2015-04-30 NOTE — Progress Notes (Signed)
Pharmacy: Re- vancomycin  Patient's a 60 y.o F on vancomycin and aztreonam day #4 for PNA.  Vancomycin trough level now back sub-therapeutic at 7 (goal 15-20). MD's note today indicated plan for possible transition to PO abx on 1/26 if patient continues to improve.  Antimicrobials this admission (and in past week at [facility]): Doxycycline prior to 1/13 admit 1/13 Aztreonam >> 1/17; 1/22>> 1/14 Vanc >> 1/14; 1/22>> 1/15 Clinda >>1/18 1/18 Levaquin>>1/22 1/18 Flagyl>>1/22 1/22 >> nystatin >>  Fever resolved Leukocytosis resolved  Levels/dose changes this admission: 1/25 VT= 7 (on 500 q12h)  Microbiology Results: 1/22 blood - ngtd 1/24 sputum cx reordered   Plan: - increase vancomycin to 500 mg IV q8h - continue aztreonam 2 gm IV q8h  Dia Sitter, PharmD, BCPS 04/30/2015 6:09 PM

## 2015-04-30 NOTE — Progress Notes (Signed)
Speech Language Pathology Treatment: Dysphagia  Patient Details Name: Haley Lowe MRN: [id number] DOB: 1955/08/14 Today's Date: 04/30/2015 Time: 4098-1191 SLP Time Calculation (min) (ACUTE ONLY): 17 min  Assessment / Plan / Recommendation Clinical Impression  Pt today sitting upright in bed with nursing student present.  Pt did not independently recall swallowing test yesterday nor compensation strategies = ? Due to brain mets from cancer.  Given decreased ability to recall copious compensation strategies = SLP wrote a single direction for double swallows with liquids and observed pt to follow strategy.  She would benefit from full supervision to maximize airway protection via strategy compliance.  Poor intake of breakfast reported as pt admits to poor appetite.    Observed pt swallowing coffee with her requiring moderate cues to double swallow.  Cough noted during intake however pt with cough was not coorelated to aspiration/penetration on MBS yesterday.    Pt admits that she took a pill with liquid and it lodged in her throat requiring more liquids to clear.   Advised pt to take pills only with pudding/applesauce and follow with liquids to aid clearance for maximal airway protection.  She reported she would comply with this request.    Given pt's decreased recall of information, SLP will focus on compensation for her dysphagia only.      HPI HPI: 60 year old female with history of thyroid cancer, metastatic adenocarcinoma to brain, who is currently undergoing [facility]. Today presents to the hospital with gradually worsening weakness and drowsiness since last night. Patient is very somnolent at this time and history obtained from patient's husband at bedside. As per patient's husband patient reported mild shortness of breath and worsening pain she has had a nonproductive cough for past 4 days. Patient is high dose morphine 60 mg twice a day and last dose was around 8 AM in the  morning. Patient had chemotherapy in November 2016 at that time she was treated for shingles and pneumonia. Pt was treated with Doxycycline on 03/29/15 twice a day for 10 days but CT scan still showed pneumonia 2 days ago so she was then placed back on the Doxycycline yesterday by her PCP. Patient is on home oxygen, she received radiation treatment for the brain metastasis and has upcoming MRI in 2 weeks. CT head done today showed no significant abnormality. In the ED chest x-ray showed bilateral basilar pneumonia. Patient started on vancomycin and Azactam per pharmacy. SLP asked to evaluate swallow.      SLP Plan  Continue with current plan of care     Recommendations  Diet recommendations: Regular;Thin liquid Liquids provided via: Cup Medication Administration: Crushed with puree (whole if small) Supervision: Staff to assist with self feeding;Full supervision/cueing for compensatory strategies;Trained caregiver to feed patient Compensations: Slow rate;Small sips/bites;Follow solids with liquid (swallow twice with sips) Postural Changes and/or Swallow Maneuvers: Seated upright 90 degrees;Upright 30-60 min after meal             Oral Care Recommendations: Oral care BID Follow up Recommendations: 24 hour supervision/assistance Plan: Continue with current plan of care     [place], [facility] SLP [phone]

## 2015-04-30 NOTE — Progress Notes (Signed)
[facility] PROGRESS NOTE  Haley Lowe BJY:[id number] DOB: 14-Mar-1956 DOA: 04/27/2015 PCP: [name], MD  Brief Narrative Patient is a 60 year old with history of metastatic adenocarcinoma to brain and thyroid cancer who is currently the and seen for hospital-acquired pneumonia. She has allergy to penicillin since currently on aztreonam and vancomycin. D-dimer elevated CT angiogram of chest reported no PE  Assessment/Plan: Active Problems:  PNA (pneumonia) HCAP - Place routine pneumonia order set and cover for HCAP related organisms - Continue Aztreonam and vancomycin for one more day. May consider transitioning to oral regimen with continued improvement in the next day - CT angiogram of chest reported no PE  Oral thrush - consult pharmacy to place on appropriate nystatin regimen  Metastatic adenocarcinoma to brain and thyroid cancer - Patient had difficulty with IV access as such unable to obtain CT angiogram of chest. Please see above. Burnis Medin have patient follow-up with oncologist after discharge for routine evaluation and recommendations. - Continue home pain medication regimen  GERD -Continue PPI  Hypothyroidism - Plan will be to continue Synthroid  Code Status: full Family Communication: no family  Disposition Plan: Most likely DC in the next one or 2 days   Consultants:  None  Procedures:  CT angiogram of chest  Antibiotics:  Aztreonam  Vancomycin  HPI/Subjective: Pt states she feels better. Husband reports patient's condition is improving. States that her confusion is subsiding  Objective: Filed Vitals:   04/30/15 0613 04/30/15 0900  BP: 119/76 119/74  Pulse: 105 100  Temp: 98.6 F (37 C) 98.5 F (36.9 C)  Resp: 18 15    Intake/Output Summary (Last 24 hours) at 04/30/15 1312 Last data filed at 04/30/15 0700  Gross per 24 hour  Intake 1588.75 ml  Output    100 ml  Net 1488.75 ml   Filed Weights   04/27/15 2027  Weight: 63.2  kg (139 lb 5.3 oz)    Exam:   General:  Pt in nad, alert and awake  Cardiovascular: rrr, no rubs  Respiratory: no wheezes, equal chest rise, no rales on today's exam  Abdomen: soft, nd, nt  Musculoskeletal: no cyanosis or clubbing   Data Reviewed: Basic Metabolic Panel:  Recent Labs Lab 04/24/15 0502 04/25/15 0541 04/27/15 1456 04/27/15 2333 04/30/15 0750  NA  --  145 142  --   --   K  --  2.9* 4.3  --   --   CL  --  116* 112*  --   --   CO2  --  20* 23  --   --   GLUCOSE  --  97 77  --   --   BUN  --  <5* 6  --   --   CREATININE  --  0.40* 0.48 0.49 0.40*  CALCIUM  --  8.4* 8.8*  --   --   MG 1.7  --   --  1.6*  --   PHOS  --   --   --  4.1  --    Liver Function Tests:  Recent Labs Lab 04/27/15 1456  AST 25  ALT 10*  ALKPHOS 66  BILITOT 0.9  PROT 5.6*  ALBUMIN 3.0*   No results for input(s): LIPASE, AMYLASE in the last 168 hours. No results for input(s): AMMONIA in the last 168 hours. CBC:  Recent Labs Lab 04/24/15 0502 04/25/15 0541 04/27/15 1456 04/27/15 2333  WBC 4.5 7.8 5.7 5.5  NEUTROABS  --   --  4.4  --  HGB 12.2 12.3 10.9* 11.6*  HCT 38.9 39.1 34.8* 36.1  MCV 95.3 96.3 95.6 94.8  PLT 153 181 233 241   Cardiac Enzymes: No results for input(s): CKTOTAL, CKMB, CKMBINDEX, TROPONINI in the last 168 hours. BNP (last 3 results)  Recent Labs  03/15/15 2100 04/18/15 2031  BNP 81.0 42.0    ProBNP (last 3 results) No results for input(s): PROBNP in the last 8760 hours.  CBG: No results for input(s): GLUCAP in the last 168 hours.  Recent Results (from the past 240 hour(s))  Culture, blood (routine x 2) Call MD if unable to obtain prior to antibiotics being given     Status: None (Preliminary result)   Collection Time: 04/27/15 11:33 PM  Result Value Ref Range Status   Specimen Description BLOOD LEFT HAND  Final   Special Requests BOTTLES DRAWN AEROBIC ONLY 5CC  Final   Culture   Final    NO GROWTH 2 DAYS Performed at [facility]    Report Status PENDING  Incomplete  Culture, sputum-assessment     Status: None   Collection Time: 04/28/15  7:45 PM  Result Value Ref Range Status   Specimen Description SPUTUM  Final   Special Requests Immunocompromised  Final   Sputum evaluation   Final    MICROSCOPIC FINDINGS SUGGEST THAT THIS SPECIMEN IS NOT REPRESENTATIVE OF LOWER RESPIRATORY SECRETIONS. PLEASE RECOLLECT. [facility] RN [phone] [place],N    Report Status 04/28/2015 FINAL  Final     Studies: Ct Angio Chest Pe W/cm &/or Wo Cm  04/28/2015  CLINICAL DATA:  Two week history progressively worsening with weakness, cough, and shortness of breath. History of thyroid carcinoma and metastatic adenocarcinoma to brain undergoing chemotherapy at [place]: CT ANGIOGRAPHY CHEST WITH CONTRAST TECHNIQUE: Multidetector CT imaging of the chest was performed using the standard protocol during bolus administration of intravenous contrast. Multiplanar CT image reconstructions and MIPs were obtained to evaluate the vascular anatomy. CONTRAST:  124m OMNIPAQUE IOHEXOL 350 MG/ML SOLN COMPARISON:  03/15/2015 FINDINGS: Technically adequate study with good opacification of the central and segmental pulmonary arteries. No focal filling defects demonstrated. No evidence of significant pulmonary embolus. Normal heart size. Small pericardial effusion. Normal caliber thoracic aorta without aneurysm or dissection. Aortic calcification. Great vessel origins are patent. Surgical absence of the thyroid gland. Esophagus is decompressed. No significant hilar, mediastinal, or axillary lymphadenopathy. Mild prominence of retrocrural lymph nodes, measuring up to about 9 mm short axis dimension. These are nonspecific and could represent reactive or metastatic nodes. Retrocrural nodes are slightly more prominent on previous study. Emphysematous changes in the lungs. Diffuse bronchial thickening and basilar mucous plugging suggesting chronic  bronchitis. Patchy airspace infiltrates in the lung bases with more diffuse tree-in-bud infiltrates throughout the lower lungs. Scattered tiny pulmonary nodules. Changes are likely to represent inflammatory process such as bronchopneumonia. Given the history, atypical infection or interstitial metastatic disease are not entirely excluded and follow-up is suggested. Appearances have progressed since the previous study. No pleural effusions. No pneumothorax. Included portions of the upper abdominal organs demonstrate diffuse fatty infiltration of the liver. Degenerative changes in the spine. No destructive bone lesions. Review of the MIP images confirms the above findings. IMPRESSION: No evidence of significant pulmonary embolus. Patchy airspace disease in the lung bases with diffuse nodular tree-in-bud pattern throughout the lungs likely representing endobronchial bacterial infection, although atypical infection (fungal or atypical mycobacterial) or interstitial metastatic disease not entirely excluded. Findings are progressing since previous study. Chronic bronchitic  changes and emphysematous changes in the lungs. Small pericardial effusion. Diffuse fatty infiltration of the liver. Electronically Signed   By: Lucienne Capers M.D.   On: 04/28/2015 19:06   Dg Swallowing Func-speech Pathology  04/29/2015  Objective Swallowing Evaluation: Type of Study: MBS-Modified Barium Swallow Study Patient Details Name: Haley Lowe MRN: [id number] Date of Birth: 1955/07/14 Today's Date: 04/29/2015 Time: SLP Start Time (ACUTE ONLY): 1215-SLP Stop Time (ACUTE ONLY): 1240 SLP Time Calculation (min) (ACUTE ONLY): 25 min Past Medical History: Past Medical History Diagnosis Date . Essential hypertension  . Anxiety  . Depression  . COPD (chronic obstructive pulmonary disease) ([place])  . GERD (gastroesophageal reflux disease)  . Headache(784.0)  . Arthritis  . Hx of adenomatous colonic polyps  . Emphysema  . Cataract    1 lens implaqnt  right eye,intact on left eye . Goiter  . Fibromyalgia  . Pulmonary emboli ([place]) 11/04/11   Right upper lobe and r lower lobe PE . Lung nodule seen on imaging study 11/04/11 CT   34m LLL . D[place]) ([place] 12/09[place]2011 . [place]monary embolism ([place] 12/09/2011 . Hyperlipidemia  . History of radiation therapy 10/06/11   SRS 15Gy 198f brain . UTI (lower urinary tract infection)  . Borderline diabetes mellitus  . Metastatic adenocarcinoma to brain ([place]13   Left frontoparietal region . Thyroid cancer ([place] . S/P radiation therapy 05/31/14 SRS   Brain . PONV (postoperative nausea and vomiting)  . Controlled diabetes mellitus type II without complication ([id number] Previously diagnosed with borderline diabetes. . Marland Kitchenerpes simplex esophagitis 03/11/2015   Also candidal esophagitis . HCAP (healthcare-associated pneumonia) 03/03/2015 . Mucositis due to radiation therapy 03/03/2015 Past Surgical History: Past Surgical History Procedure Laterality Date . Abdominal hysterectomy  1996 . Cholecystectomy  2000 . Shoulder surgery Right 1998 . Eye surgery  2001 . Foot surgery Left 2005 . Craniotomy  09/15/2011   Procedure: CRANIOTOMY TUMOR EXCISION;  Surgeon: RoHosie SpangleMD;  [facility];  Service: Neurosurgery;  Laterality: N/A;  Craniotomy resection of tumor with stealth . Cataract extraction  2011   With lens implant . Total thyroidectomy  10-29-2013   [facility]   . Video bronchoscopy with endobronchial ultrasound N/A 04/11/2014   Procedure: VIDEO BRONCHOSCOPY WITH ENDOBRONCHIAL ULTRASOUND;  Surgeon: StMelrose NakayamaMD;  [place] Service: Thoracic;  Laterality: N/A; . Esophagogastroduodenoscopy (egd) with propofol N/A 03/07/2015   Procedure: ESOPHAGOGASTRODUODENOSCOPY (EGD) WITH PROPOFOL;  Surgeon: RoDaneil DolinMD;  Location[facility];  Service: Endoscopy;  Laterality: N/A; . Esophageal biopsy  03/07/2015   Procedure: BIOPSY;   Surgeon: RoDaneil DolinMD;  [facility];  Service: Endoscopy;; HPI: [id number] old female with history of thyroid cancer, metastatic adenocarcinoma to brain, who [place] [facility] [place].  Pt recently treated for pna and released home but returned due to ongoing symptoms.   Patient had chemotherapy and radiation in November 2016 at that time she was treated for shingles and pneumonia per chart review.  CT scan still showed pneumonia early January.  Spouse and pt report plans for repeat testing to determine if pt needs more chemo and radiation.  Pt has h/o esophagitis/mucositis and thrush that contribute to her dysphagia.  SLP asked to evaluate swallow. Subjective: none stated Assessment / Plan / Recommendation CHL IP CLINICAL IMPRESSIONS 04/29/2015 Therapy Diagnosis Moderate oral phase dysphagia;Mild oral phase  dysphagia;Mild pharyngeal phase dysphagia Clinical Impression Mild=moderate oropharyngeal dysphagia WITHOUT aspiration of any consistency tested.  Pt noted to cough several times during MBS without barium visualized in trachea/larynx.  Delayed oral transiting with lingual pumping noted with mild tongue base residuals without pt awareness.  Pharyngeal swallow characterized by decreased tongue base retraction resulting in mild vallecular/tongue base residual.  Liquid washes aid clearance of pudding/cracker and CUED dry swallows aid clearance of pharynx.  Recommend advance diet to allow thin liquids with strict aspiration precautions.  Using teach back, live video, educated pt and spouse to findings/recommendations.  SLP to follow up for dysphagia management.  Impact on safety and function Moderate aspiration risk   CHL IP TREATMENT RECOMMENDATION 04/29/2015 Treatment Recommendations Therapy as outlined in treatment plan below   Prognosis 04/29/2015 Prognosis for Safe Diet Advancement Guarded Barriers to Reach Goals Cognitive  deficits;Severity of deficits Barriers/Prognosis Comment -- CHL IP DIET RECOMMENDATION 04/29/2015 SLP Diet Recommendations Regular solids;Thin liquid Liquid Administration via -- Medication Administration Crushed with puree Compensations Slow rate;Small sips/bites;Multiple dry swallows after each bite/sip;Clear throat intermittently;Follow solids with liquid Postural Changes Seated upright at 90 degrees;Remain semi-upright after after feeds/meals (Comment)   CHL IP OTHER RECOMMENDATIONS 04/29/2015 Recommended Consults -- Oral Care Recommendations Oral care BID Other Recommendations Have oral suction available;Clarify dietary restrictions   CHL IP FOLLOW UP RECOMMENDATIONS 04/29/2015 Follow up Recommendations 24 hour supervision/assistance   CHL IP FREQUENCY AND DURATION 04/29/2015 Speech Therapy Frequency (ACUTE ONLY) min 2x/week Treatment Duration 1 week      CHL IP ORAL PHASE 04/29/2015 Oral Phase Impaired Oral - Pudding Teaspoon -- Oral - Pudding Cup -- Oral - Honey Teaspoon -- Oral - Honey Cup -- Oral - Nectar Teaspoon Weak lingual manipulation;Lingual pumping;Reduced posterior propulsion;Delayed oral transit Oral - Nectar Cup Weak lingual manipulation;Reduced posterior propulsion;Piecemeal swallowing;Delayed oral transit Oral - Nectar Straw -- Oral - Thin Teaspoon Delayed oral transit;Weak lingual manipulation;Reduced posterior propulsion;Lingual/palatal residue;Lingual pumping Oral - Thin Cup Lingual pumping;Weak lingual manipulation;Delayed oral transit;Reduced posterior propulsion;Piecemeal swallowing Oral - Thin Straw -- Oral - Puree Lingual pumping;Weak lingual manipulation;Reduced posterior propulsion Oral - Mech Soft Lingual pumping;Weak lingual manipulation;Reduced posterior propulsion;Impaired mastication Oral - Regular -- Oral - Multi-Consistency -- Oral - Pill Lingual pumping;Other (Comment) Oral Phase - Comment pt extended head upward with nectar, ? to aid oral transiting, tongue base residuals without  awareness   CHL IP PHARYNGEAL PHASE 04/29/2015 Pharyngeal Phase Impaired Pharyngeal- Pudding Teaspoon -- Pharyngeal -- Pharyngeal- Pudding Cup -- Pharyngeal -- Pharyngeal- Honey Teaspoon -- Pharyngeal -- Pharyngeal- Honey Cup -- Pharyngeal -- Pharyngeal- Nectar Teaspoon Reduced tongue base retraction Pharyngeal -- Pharyngeal- Nectar Cup Reduced tongue base retraction;Delayed swallow initiation-pyriform sinuses Pharyngeal -- Pharyngeal- Nectar Straw -- Pharyngeal -- Pharyngeal- Thin Teaspoon Reduced tongue base retraction Pharyngeal -- Pharyngeal- Thin Cup Reduced tongue base retraction;Delayed swallow initiation-pyriform sinuses;Penetration/Aspiration during swallow Pharyngeal Material enters airway, remains ABOVE vocal cords then ejected out Pharyngeal- Thin Straw Reduced tongue base retraction;Pharyngeal residue - valleculae Pharyngeal -- Pharyngeal- Puree Reduced tongue base retraction;Pharyngeal residue - valleculae Pharyngeal -- Pharyngeal- Mechanical Soft Reduced tongue base retraction;Pharyngeal residue - valleculae Pharyngeal -- Pharyngeal- Regular -- Pharyngeal -- Pharyngeal- Multi-consistency -- Pharyngeal -- Pharyngeal- Pill Pharyngeal residue - valleculae Pharyngeal -- Pharyngeal Comment pt with mild tongue base/vallecular residuals without awareness, following solids with liquids. dry swallows cued helpful   CHL IP CERVICAL ESOPHAGEAL PHASE 04/29/2015 Cervical Esophageal Phase Impaired Pudding Teaspoon -- Pudding Cup -- Honey Teaspoon -- Honey Cup -- Nectar Teaspoon -- Nectar Cup -- [employer]  Straw -- Thin Teaspoon -- Thin Cup -- Thin Straw -- Puree -- Mechanical Soft -- Regular -- Multi-consistency -- Pill -- Cervical Esophageal Comment appearance of slow clearance of pudding below UES without backflow; liquids appeared to facilitate clearance  upon esophageal sweep - pt appeared with coating of barium throughout the esophagus- ? if due to esophagitis/candidiasis  No flowsheet data found. Janett Labella L[place], [facility] SLP [phone]               Scheduled Meds: . amitriptyline  25 mg Oral QHS  . antiseptic oral rinse  7 mL Mouth Rinse BID  . aztreonam  2 g Intravenous Q8H  . feeding supplement (ENSURE ENLIVE)  237 mL Oral BID BM  . heparin  5,000 Units Subcutaneous 3 times per day  . levothyroxine  175 mcg Oral QAC breakfast  . nystatin  5 mL Mouth/Throat QID  . pantoprazole  40 mg Oral BID  . SONAFINE  1 application Topical Daily  . sucralfate  1 g Oral TID WC & HS  . valACYclovir  1,000 mg Oral Daily  . vancomycin  500 mg Intravenous Q12H   Continuous Infusions: . sodium chloride 75 mL/hr at 04/30/15 0039     Time spent: > 35 minutes    Velvet Bathe  [facility] Pager [phone] If 7PM-7AM, please contact night-coverage at [email or web address], password [facility] 04/30/2015, 1:12 PM  LOS: 2 days

## 2015-04-30 NOTE — Evaluation (Signed)
Physical Therapy Evaluation Patient Details Name: Haley Lowe MRN: [id number] DOB: Mar 01, 1956 Today's Date: 04/30/2015   History of Present Illness  Pt admitted with SOB and dx of PNA.  Pt on home O2 and with hs of COPD, thyroid CA, brain mets and fibromyalgia.  Clinical Impression  Pt admitted as above and presenting with functional mobility limitations 2* generalized weakness and deconditioning, balance deficits and questionable safety awareness.  Pt hopes to dc home with 24/7 family assist and would benefit from follow up [place].    Follow Up Recommendations Home health PT    Equipment Recommendations       Recommendations for Other Services OT consult     Precautions / Restrictions Precautions Precautions: Fall Restrictions Weight Bearing Restrictions: No      Mobility  Bed Mobility Overal bed mobility: Modified Independent                Transfers Overall transfer level: Needs assistance Equipment used: Rolling walker (2 wheeled) Transfers: Sit to/from Stand Sit to Stand: Min assist         General transfer comment: cues for safe transition position and use of UEs to self assist  Ambulation/Gait Ambulation/Gait assistance: Min assist;Min guard Ambulation Distance (Feet): 450 Feet Assistive device: Rolling walker (2 wheeled) Gait Pattern/deviations: Step-through pattern;Decreased step length - right;Decreased step length - left;Shuffle;Trunk flexed Gait velocity: decr Gait velocity interpretation: Below normal speed for age/gender General Gait Details: Mild- mod instability primarily posteriorly largely corrected with use of RW.  CUes for posture, position from RW and safe walker management.  Stairs            Wheelchair Mobility    Modified Rankin (Stroke Patients Only)       Balance Overall balance assessment: Needs assistance Sitting-balance support: No upper extremity supported;Feet supported Sitting balance-Leahy Scale: Good      Standing balance support: No upper extremity supported Standing balance-Leahy Scale: Poor                               Pertinent Vitals/Pain Pain Assessment: 0-10 Pain Score: 6  Pain Location: Neck "where they did the surgery" Pain Descriptors / Indicators: Aching;Sore Pain Intervention(s): Limited activity within patient's tolerance;Monitored during session;Premedicated before session    [place] expects to be discharged to:: Private residence Living Arrangements: Spouse/significant other Available Help at Discharge: Family Type of Home: House Home Access: Stairs to enter Entrance Stairs-Rails: None Entrance Stairs-Number of Steps: 4 Home Layout: One level Home Equipment: Environmental consultant - 2 wheels;Cane - single point      Prior Function Level of Independence: Needs assistance   Gait / Transfers Assistance Needed: Pt states not using assistive device but fears falling at home and uses furniture to balance     Comments: able to be independent when feeling well, husband available to assist PRN     Hand Dominance   Dominant Hand: Right    Extremity/Trunk Assessment   Upper Extremity Assessment: Generalized weakness           Lower Extremity Assessment: Generalized weakness         Communication   Communication: No difficulties  Cognition Arousal/Alertness: Awake/alert Behavior During Therapy: WFL for tasks assessed/performed Overall Cognitive Status: Within Functional Limits for tasks assessed       Memory: Decreased short-term memory              General Comments  Exercises        Assessment/Plan    PT Assessment Patient needs continued PT services  PT Diagnosis Difficulty walking   PT Problem List Decreased strength;Decreased range of motion;Decreased activity tolerance;Decreased balance;Decreased mobility;Decreased knowledge of use of DME;Pain;Decreased safety awareness;Decreased cognition  PT Treatment  Interventions DME instruction;Gait training;Stair training;Functional mobility training;Therapeutic activities;Therapeutic exercise;Patient/family education   PT Goals (Current goals can be found in the Care Plan section) Acute Rehab PT Goals Patient Stated Goal: Home PT Goal Formulation: With patient Time For Goal Achievement: 05/14/15 Potential to Achieve Goals: Fair    Frequency Min 3X/week   Barriers to discharge        Co-evaluation               End of Session Equipment Utilized During Treatment: Gait belt;Oxygen Activity Tolerance: Patient tolerated treatment well Patient left: in chair;with call bell/phone within reach;with family/visitor present Nurse Communication: Mobility status         Time: 1135-1208 PT Time Calculation (min) (ACUTE ONLY): 33 min   Charges:   PT Evaluation $PT Eval Low Complexity: 1 Procedure PT Treatments $Gait Training: 8-22 mins   PT G Codes:        Haley Lowe 02-May-2015, 2:06 PM

## 2015-04-30 NOTE — Clinical Documentation Improvement (Signed)
Family Medicine   Please clarify nutritional status in progress notes and discharge summary   Severe Protein Calorie Malnutrition  Other condition  Unable to clinically determine  Document any associated diagnoses/conditions   Supporting Information:   1/23 Initial Nutrition Assessment DOCUMENTATION CODES: Severe malnutrition in context of acute illness/injury NUTRITION DIAGNOSIS: Increased nutrient needs related to catabolic illness, cancer and cancer related treatments as evidenced by estimated needs. Per chart review, pt ate 50% of lunch today. Per visitor, pt has oral thrush which is limiting intakes due to painful sensation. Pt denies any foods or drinks making this sensation worse than others. Pt is currently ordered Ensure Enlive BID and she states that PTA and prior to development of thrush she was drinking 1-2 bottles of Ensure/day. She has had some taste alteration, not liking foods that were once favorite foods, but is unable to give further detail on this. Per chart review, pt has lost 12 lbs (8% body weight) in the past 3 months which is significant for time frame. In addition, she has lost 6 lbs (4% body weight) in the past 13 days which is significant for time frame. Pt refused physical assessment; will attempt to complete on follow-up.    Height 5'4"     Weight 139 lbs    BMI 23.9  Treatment RD consult Regular diet w/aspiration precautions No straws Ensure Resource thickening agent-nectar thick   Please exercise your independent, professional judgment when responding. A specific answer is not anticipated or expected.   Thank You, [place] [phone]

## 2015-05-01 DIAGNOSIS — E039 Hypothyroidism, unspecified: Secondary | ICD-10-CM

## 2015-05-01 DIAGNOSIS — E876 Hypokalemia: Secondary | ICD-10-CM

## 2015-05-01 DIAGNOSIS — J189 Pneumonia, unspecified organism: Secondary | ICD-10-CM | POA: Insufficient documentation

## 2015-05-01 DIAGNOSIS — C7931 Secondary malignant neoplasm of brain: Secondary | ICD-10-CM

## 2015-05-01 DIAGNOSIS — E43 Unspecified severe protein-calorie malnutrition: Secondary | ICD-10-CM

## 2015-05-01 LAB — BASIC METABOLIC PANEL
ANION GAP: 9 (ref 5–15)
BUN: <5 mg/dL — ABNORMAL LOW (ref 6–20)
CALCIUM: 7.9 mg/dL — AB (ref 8.9–10.3)
CO2: 24 mmol/L (ref 22–32)
Chloride: 110 mmol/L (ref 101–111)
Creatinine, Ser: 0.36 mg/dL — ABNORMAL LOW (ref 0.44–1.00)
Glucose, Bld: 126 mg/dL — ABNORMAL HIGH (ref 65–99)
POTASSIUM: 2.7 mmol/L — AB (ref 3.5–5.1)
SODIUM: 143 mmol/L (ref 135–145)

## 2015-05-01 LAB — MAGNESIUM: MAGNESIUM: 1.4 mg/dL — AB (ref 1.7–2.4)

## 2015-05-01 MED ORDER — POTASSIUM CHLORIDE CRYS ER 20 MEQ PO TBCR
40.0000 meq | EXTENDED_RELEASE_TABLET | ORAL | Status: AC
  Administered 2015-05-01 (×3): 40 meq via ORAL
  Filled 2015-05-01 (×3): qty 2

## 2015-05-01 MED ORDER — ACYCLOVIR 5 % EX OINT
TOPICAL_OINTMENT | CUTANEOUS | Status: DC | PRN
  Administered 2015-05-01: 22:00:00 via TOPICAL
  Filled 2015-05-01: qty 15

## 2015-05-01 MED ORDER — LEVOFLOXACIN 750 MG PO TABS
750.0000 mg | ORAL_TABLET | Freq: Every day | ORAL | Status: DC
  Administered 2015-05-01 – 2015-05-02 (×2): 750 mg via ORAL
  Filled 2015-05-01 (×2): qty 1

## 2015-05-01 MED ORDER — ACYCLOVIR 5 % EX CREA
TOPICAL_CREAM | CUTANEOUS | Status: DC | PRN
  Filled 2015-05-01: qty 5

## 2015-05-01 MED ORDER — MAGNESIUM SULFATE 4 GM/100ML IV SOLN
4.0000 g | Freq: Once | INTRAVENOUS | Status: AC
Start: 2015-05-01 — End: 2015-05-01
  Administered 2015-05-01: 4 g via INTRAVENOUS
  Filled 2015-05-01: qty 100

## 2015-05-01 MED ORDER — AZITHROMYCIN 500 MG PO TABS: 500.0000 mg | ORAL_TABLET | Freq: Every day | ORAL | Status: DC

## 2015-05-01 MED ORDER — POTASSIUM CHLORIDE CRYS ER 20 MEQ PO TBCR
40.0000 meq | EXTENDED_RELEASE_TABLET | Freq: Every day | ORAL | Status: DC
Start: 2015-05-02 — End: 2015-05-02
  Filled 2015-05-01: qty 2

## 2015-05-01 NOTE — Progress Notes (Signed)
CRITICAL VALUE ALERT  Critical value received:  Potassium 2.7  Date of notification:  05-01-15  Time of notification:  9:57am  Critical value read back: yes  Nurse who received alert:  Henrietta Dine  MD notified (1st page):  Dr. Grandville Silos  Time of first page:  10:02am  MD notified (2nd page):  Time of second page:  Responding MD:  Dr. Grandville Silos  Time MD responded:

## 2015-05-01 NOTE — Progress Notes (Signed)
PHARMACY NOTE -  ANTIBIOTIC RENAL DOSE ADJUSTMENT    Request received for Pharmacy to assist with antibiotic renal dose adjustment.   Patient has been narrowed to Levaquin for PNA.  SCr ~0.4, estimated CrCl 65 ml/min  Current dosage is appropriate and need for further dosage adjustment appears unlikely at present.  Will sign off at this time.  Please reconsult if a change in clinical status warrants re-evaluation of dosage.  Reuel Boom, PharmD, BCPS Pager: [phone] 05/01/2015, 9:05 AM

## 2015-05-01 NOTE — Progress Notes (Signed)
[facility] PROGRESS NOTE  Haley Lowe TGG:[id number] DOB: Jun 01, 1955 DOA: 04/27/2015 PCP: [name], MD  Assessment/Plan: #1 healthcare associated pneumonia Clinical improvement. Patient currently afebrile. CT angiogram chest negative for PE.leukocytosis resolved.lood cultures were no growth to date.Sputum Gram stain and cultures negative. Urine Legionella antigen negative. Urine pneumococcus antigen negative. Discontinue IV vancomycin IV history on them. Transitioned to oral Levaquin to complete course of antibiotic therapy.ollow.  #2 chronic hypokalemia/hypomagnesemia Patient states she is on oral supplementation of potassium at home chronically Will give patient IV magnesium. KDur 40 mEq by mouth every 4 hours 3 doses. Will start patient back on K Dur 40 mEq daily tomorrow. Follow.  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 Outpatient follow-up.  #4 gastroesophageal reflux disease PPI.  #5 hypothyroidism Continue Synthroid.  #6 oral thrush Continue nystatin.  #7 prophylaxis PPI for GI prophylaxsis, Heparin  for DVT prophylaxis.  Code Status: ull Family Communication: updated patient. No family at bedside. Disposition Plan: ome when medically stable with improvement in hypokalemia hopefully tomorrow.   Consultants:  none  Procedures:  CT angiogram chest 04/28/2015  Chest x-ray 04/27/2015  Antibiotics:  IV aztreonam 04/27/2015>>>>> 05/01/2015  IV vancomycin 04/27/2015>>>>> 05/01/2015  Oral Levaquin 05/01/2015  HPI/Subjective: Patient denies any chest pain. Patient denies any shortness of breath. Patient states she's feeling better. Patient asking when she can go home.  Objective: Filed Vitals:   04/30/15 2100 05/01/15 0510  BP: 102/73 118/69  Pulse: 97 91  Temp: 98.2 F (36.8 C) 98.2 F (36.8 C)  Resp: 15 15    Intake/Output Summary (Last 24 hours) at 05/01/15 1754 Last data filed at  05/01/15 0600  Gross per 24 hour  Intake   1995 ml  Output    950 ml  Net   1045 ml   Filed Weights   04/27/15 2027  Weight: 63.2 kg (139 lb 5.3 oz)    Exam:   General:  NAD  Cardiovascular: RRR  Respiratory: CTAB  Abdomen: soft, nontender, nondistended, positive bowel sounds.  Musculoskeletal: no clubbing cyanosis or edema.  Data Reviewed: Basic Metabolic Panel:  Recent Labs Lab 04/25/15 0541 04/27/15 1456 04/27/15 2333 04/30/15 0750 05/01/15 0920  NA 145 142  --   --  143  K 2.9* 4.3  --   --  2.7*  CL 116* 112*  --   --  110  CO2 20* 23  --   --  24  GLUCOSE 97 77  --   --  126*  BUN <5* 6  --   --  <5*  CREATININE 0.40* 0.48 0.49 0.40* 0.36*  CALCIUM 8.4* 8.8*  --   --  7.9*  MG  --   --  1.6*  --  1.4*  PHOS  --   --  4.1  --   --    Liver Function Tests:  Recent Labs Lab 04/27/15 1456  AST 25  ALT 10*  ALKPHOS 66  BILITOT 0.9  PROT 5.6*  ALBUMIN 3.0*   No results for input(s): LIPASE, AMYLASE in the last 168 hours. No results for input(s): AMMONIA in the last 168 hours. CBC:  Recent Labs Lab 04/25/15 0541 04/27/15 1456 04/27/15 2333  WBC 7.8 5.7 5.5  NEUTROABS  --  4.4  --   HGB 12.3 10.9* 11.6*  HCT 39.1 34.8* 36.1  MCV 96.3 95.6 94.8  PLT 181 233 241   Cardiac Enzymes: No results for input(s): CKTOTAL, CKMB, CKMBINDEX, TROPONINI in  the last 168 hours. BNP (last 3 results)  Recent Labs  03/15/15 2100 04/18/15 2031  BNP 81.0 42.0    ProBNP (last 3 results) No results for input(s): PROBNP in the last 8760 hours.  CBG: No results for input(s): GLUCAP in the last 168 hours.  Recent Results (from the past 240 hour(s))  Culture, blood (routine x 2) Call MD if unable to obtain prior to antibiotics being given     Status: None (Preliminary result)   Collection Time: 04/27/15 11:33 PM  Result Value Ref Range Status   Specimen Description BLOOD LEFT HAND  Final   Special Requests BOTTLES DRAWN AEROBIC ONLY 5CC  Final    Culture   Final    NO GROWTH 3 DAYS Performed at [facility]    Report Status PENDING  Incomplete  Culture, sputum-assessment     Status: None   Collection Time: 04/28/15  7:45 PM  Result Value Ref Range Status   Specimen Description SPUTUM  Final   Special Requests Immunocompromised  Final   Sputum evaluation   Final    MICROSCOPIC FINDINGS SUGGEST THAT THIS SPECIMEN IS NOT REPRESENTATIVE OF LOWER RESPIRATORY SECRETIONS. PLEASE RECOLLECT. [facility] RN [phone] [place],N    Report Status 04/28/2015 FINAL  Final     Studies: No results found.  Scheduled Meds: . amitriptyline  25 mg Oral QHS  . antiseptic oral rinse  7 mL Mouth Rinse BID  . feeding supplement (ENSURE ENLIVE)  237 mL Oral BID BM  . heparin  5,000 Units Subcutaneous 3 times per day  . levofloxacin  750 mg Oral Daily  . levothyroxine  175 mcg Oral QAC breakfast  . nystatin  5 mL Mouth/Throat QID  . pantoprazole  40 mg Oral BID  . potassium chloride  40 mEq Oral Q4H  . [START ON 05/02/2015] potassium chloride  40 mEq Oral Daily  . SONAFINE  1 application Topical Daily  . sucralfate  1 g Oral TID WC & HS  . valACYclovir  1,000 mg Oral Daily   Continuous Infusions:   Principal Problem:   Healthcare-associated pneumonia Active Problems:   Metastatic adenocarcinoma to brain, thyroid cancer   Primary papillary thyroid carcinoma (HCC)   GERD (gastroesophageal reflux disease)   Mucositis due to radiation therapy   Pneumonia   Hypokalemia   Hypothyroid   Dehydration   Protein-calorie malnutrition, severe   Antineoplastic chemotherapy induced pancytopenia ([place])   Controlled diabetes mellitus type II without complication (HCC)   PNA (pneumonia)   Hypomagnesemia    Time spent: 37 mins    [facility] MD [facility] Pager [phone]. If 7PM-7AM, please contact night-coverage at [email or web address], password [facility] 05/01/2015, 5:54 PM  LOS: 3 days

## 2015-05-01 NOTE — Care Management Important Message (Signed)
Important Message  Patient Details  Name: Vestal Crandall MRN: [id number] Date of Birth: 07/31/1955   Medicare Important Message Given:  Yes    Camillo Flaming 05/01/2015, 11:33 AMImportant Message  Patient Details  Name: Tuesday Terlecki MRN: [id number] Date of Birth: 06-10-1955   Medicare Important Message Given:  Yes    Camillo Flaming 05/01/2015, 11:33 AM

## 2015-05-01 NOTE — Progress Notes (Signed)
Nutrition Follow-up  DOCUMENTATION CODES:   Severe malnutrition in context of acute illness/injury  INTERVENTION:  - Continue current diet order, per SLP recommendation - Continue Ensure Enlive BID - RD will continue to monitor for needs  NUTRITION DIAGNOSIS:   Increased nutrient needs related to catabolic illness, cancer and cancer related treatments as evidenced by estimated needs. -ongoing  GOAL:   Patient will meet greater than or equal to 90% of their needs -variably met  MONITOR:   PO intake, Supplement acceptance, Weight trends, Labs, I & O's  ASSESSMENT:   60 year-old female with history of thyroid cancer, metastatic adenocarcinoma to brain who is undergoing chemotherapy at [facility]. His presenting with worsening weakness cough and shortness of breath. The problem has been present for the last 2 weeks and has progressively gotten worse. Patient reports coughing up phlegm which has been discolored. Patient denies coughing up any blood however. Given the persistence in problem patient presented to the ED for further evaluation recommendations.  1/26 Per chart review pt ate 50% of lunch 1/23 and 25% of dinner last night. SLP saw pt 1/24 and recommended thin liquids at that time. Pt reports she has been tolerating this liquid consistency without issue. Pt reports that her appetite continues to be poor and that she feels it will be better when she goes home. Pt states she ate a chicken wrap for lunch today. She denies any abdominal pain but states that she has been having ongoing nausea without emesis.   Pt likely not meeting needs at this time. Medications reviewed. Labs reviewed; K: 2.7 mmol/L, BUN <5 mg/dL, creatinine low, Ca: 7.9 mg/L, Mg: 1.4 mg/dL.    1/23 - Pt very solemn during RD visit and visitor (husband?) in the room does assist with answering some questions.  - Per chart review, pt ate 50% of lunch today.  - Per visitor, pt has oral thrush which is limiting  intakes due to painful sensation.  - Pt denies any foods or drinks making this sensation worse than others.  - Pt is currently ordered Ensure Enlive BID and she states that PTA and prior to development of thrush she was drinking 1-2 bottles of Ensure/day.  - She has had some taste alteration, not liking foods that were once favorite foods, but is unable to give further detail on this. - Visitor states that when pt was recently at [facility] she was told she was aspirating and that recommendation and order was placed for nectar-thick liquids.  - Pt and visitor would like this to continue during this admission; order for nectar-thick liquids is already in place.  - Will order thickener should pt receive thin liquids that then need thickening.  - Per chart review, pt has lost 12 lbs (8% body weight) in the past 3 months which is significant for time frame. - In addition, she has lost 6 lbs (4% body weight) in the past 13 days which is significant for time frame. - Pt refused physical assessment; will attempt to complete on follow-up.    Diet Order:  Diet regular Room service appropriate?: Yes with Assist; Fluid consistency:: Thin  Skin:  Reviewed, no issues  Last BM:  1/24  Height:   Ht Readings from Last 1 Encounters:  04/27/15 5\' 4"  (1.626 m)    Weight:   Wt Readings from Last 1 Encounters:  04/27/15 139 lb 5.3 oz (63.2 kg)    Ideal Body Weight:  54.54 kg (kg)  BMI:  Body mass  index is 23.9 kg/(m^2).  Estimated Nutritional Needs:   Kcal:  1900-2100 (30-34 kcal/kg)  Protein:  75-85 grams  Fluid:  >/= 2.1 L/day  EDUCATION NEEDS:   No education needs identified at this time     Jarome Matin, RD, LDN Inpatient Clinical Dietitian Pager # [phone] After hours/weekend pager # [phone]

## 2015-05-02 DIAGNOSIS — K1233 Oral mucositis (ulcerative) due to radiation: Secondary | ICD-10-CM

## 2015-05-02 LAB — CBC
HEMATOCRIT: 37.5 % (ref 36.0–46.0)
Hemoglobin: 11.2 g/dL — ABNORMAL LOW (ref 12.0–15.0)
MCH: 29.6 pg (ref 26.0–34.0)
MCHC: 29.9 g/dL — AB (ref 30.0–36.0)
MCV: 98.9 fL (ref 78.0–100.0)
PLATELETS: 288 10*3/uL (ref 150–400)
RBC: 3.79 MIL/uL — ABNORMAL LOW (ref 3.87–5.11)
RDW: 18.9 % — AB (ref 11.5–15.5)
WBC: 2.8 10*3/uL — AB (ref 4.0–10.5)

## 2015-05-02 LAB — BASIC METABOLIC PANEL
ANION GAP: 7 (ref 5–15)
CALCIUM: 8 mg/dL — AB (ref 8.9–10.3)
CO2: 26 mmol/L (ref 22–32)
Chloride: 108 mmol/L (ref 101–111)
Creatinine, Ser: 0.32 mg/dL — ABNORMAL LOW (ref 0.44–1.00)
GFR calc Af Amer: >60 mL/min (ref >60)
GFR calc non Af Amer: >60 mL/min (ref >60)
GLUCOSE: 93 mg/dL (ref 65–99)
Potassium: 3.6 mmol/L (ref 3.5–5.1)
Sodium: 141 mmol/L (ref 135–145)

## 2015-05-02 LAB — MAGNESIUM: MAGNESIUM: 1.9 mg/dL (ref 1.7–2.4)

## 2015-05-02 MED ORDER — CLOTRIMAZOLE 1 % VA CREA
1.0000 | TOPICAL_CREAM | Freq: Every day | VAGINAL | Status: DC
  Filled 2015-05-02: qty 45

## 2015-05-02 MED ORDER — CLOTRIMAZOLE 1 % VA CREA: 1.0000 | TOPICAL_CREAM | Freq: Every day | VAGINAL | Status: DC

## 2015-05-02 MED ORDER — POTASSIUM CHLORIDE CRYS ER 20 MEQ PO TBCR
40.0000 meq | EXTENDED_RELEASE_TABLET | Freq: Two times a day (BID) | ORAL | Status: DC
  Administered 2015-05-02: 40 meq via ORAL

## 2015-05-02 MED ORDER — FLUCONAZOLE IN SODIUM CHLORIDE 200-0.9 MG/100ML-% IV SOLN
150.0000 mg | Freq: Once | INTRAVENOUS | Status: DC
Start: 2015-05-02 — End: 2015-05-02
  Filled 2015-05-02: qty 75

## 2015-05-02 MED ORDER — FLUCONAZOLE 150 MG PO TABS
150.0000 mg | ORAL_TABLET | Freq: Once | ORAL | Status: AC
Start: 2015-05-02 — End: 2015-05-02
  Administered 2015-05-02: 150 mg via ORAL
  Filled 2015-05-02: qty 1

## 2015-05-02 MED ORDER — LEVOFLOXACIN 750 MG PO TABS: 750.0000 mg | ORAL_TABLET | Freq: Every day | ORAL | Status: DC

## 2015-05-02 MED ORDER — BENZONATATE 100 MG PO CAPS: 100.0000 mg | ORAL_CAPSULE | Freq: Three times a day (TID) | ORAL | Status: DC | PRN

## 2015-05-02 MED ORDER — POTASSIUM CHLORIDE 20 MEQ/15ML (10%) PO SOLN: 40.0000 meq | Freq: Every day | ORAL | Status: DC

## 2015-05-02 MED ORDER — NYSTATIN 100000 UNIT/ML MT SUSP: 5.0000 mL | Freq: Four times a day (QID) | OROMUCOSAL | Status: DC

## 2015-05-02 NOTE — Care Management Note (Signed)
Case Management Note  Patient Details  Name: Haley Lowe MRN: [id number] Date of Birth: [birth year]  Subjective/Objective:     60 yo admitted with HCAP               Action/Plan: From home with husband. She has home 02 through [place]. PT is recommending HHPT. Pt defers decisions to daughter [name] about [facility]. [name] offered choice for [facility] services and chose [facility]. Will need orders for HHPT/OT/RN at DC. [facility] rep alerted of referral. [name] also given Private Duty Caregiver list as they are looking to obtain someone to sit with pt a couple of days a week. No other CM needs communicated.   Expected Discharge Date:                  Expected Discharge Plan:  [facility]  In-House Referral:     Discharge planning Services  CM Consult  Post Acute Care Choice:  Home Health Choice offered to:  Adult Children  DME Arranged:    DME Agency:     HH Arranged:  RN, PT, OT [place] Agency:  [place]  Status of Service:  In process, will continue to follow  Medicare Important Message Given:  Yes Date Medicare IM Given:    Medicare IM give by:    Date Additional Medicare IM Given:    Additional Medicare Important Message give by:     If discussed at [place] of Stay Meetings, dates discussed:    Additional Comments:  Lynnell Catalan, RN 05/02/2015, 1:46 PM

## 2015-05-02 NOTE — Plan of Care (Signed)
Problem: Phase I Progression Outcomes Goal: Flu/PneumoVaccines if indicated Outcome: Completed/Met Date Met:  05/02/15 Prior to admission per chart

## 2015-05-02 NOTE — Progress Notes (Signed)
Speech Language Pathology Treatment: Dysphagia  Patient Details Name: Haley Lowe MRN: [id number] DOB: 1955-11-28 Today's Date: 05/02/2015 Time: 1601-0932 SLP Time Calculation (min) (ACUTE ONLY): 17 min  Assessment / Plan / Recommendation Clinical Impression  Pt scheduled to dc home today~  Two daughters present in room and report pt swallow ability to be much improved compared to previously.  Daughter reports pt would overtly cough with water before but is tolerating much better.    SLP observed pt consuming medicine with applesauce and water - cues to start intake with liquid needed but pt with great tolerance of intake.  No indication of airway compromise or pt complaint of severe residuals.  Daughter report pt used to take large bites and needed cues to take smaller amounts at a time.  Advised to consider using children's utensils for compensations.  Reviewed findings of MBS and clinical indications for swallow precautions. SLP to sign off, no follow up needed.    Provided written and verbal strategies to pt/family to mitigate dysphagia.  Pt has met goals established for swallowing.    HPI HPI: 59 year old female with history of thyroid cancer, metastatic adenocarcinoma to brain, who is currently undergoing chemotherapy at [facility]. Today presents to the hospital with gradually worsening weakness and drowsiness since last night. Patient is very somnolent at this time and history obtained from patient's husband at bedside. As per patient's husband patient reported mild shortness of breath and worsening pain she has had a nonproductive cough for past 4 days. Patient is high dose morphine 60 mg twice a day and last dose was around 8 AM in the morning. Patient had chemotherapy in November 2016 at that time she was treated for shingles and pneumonia. Pt was treated with Doxycycline on 03/29/15 twice a day for 10 days but CT scan still showed pneumonia 2 days ago so she was then placed back on the  Doxycycline yesterday by her PCP. Patient is on home oxygen, she received radiation treatment for the brain metastasis and has upcoming MRI in 2 weeks. CT head done today showed no significant abnormality. In the ED chest x-ray showed bilateral basilar pneumonia. Patient started on vancomycin and Azactam per pharmacy. SLP asked to evaluate swallow.      SLP Plan  All goals met     Recommendations  Diet recommendations: Regular;Thin liquid Liquids provided via: Cup;No straw Medication Administration: Crushed with puree Supervision: Patient able to self feed;Trained caregiver to feed patient Compensations: Small sips/bites;Slow rate;Follow solids with liquid (dry swallows with liquids, throat clear intermittently) Postural Changes and/or Swallow Maneuvers: Seated upright 90 degrees;Upright 30-60 min after meal             Follow up Recommendations: None Plan: All goals met     [place], [place] SLP [phone]

## 2015-05-02 NOTE — Progress Notes (Signed)
Physical Therapy Treatment Patient Details Name: Haley Lowe MRN: [id number] DOB: [birth year] Today's Date: 05-09-15    History of Present Illness Pt admitted with SOB and dx of PNA.  PT on home O2 and with hs of COPD, thyroid CA, brain mets and fibromyalgia.    PT Comments    Pt up amb in hallway with dtr earlier this am, now fatigued and just had pain meds, quite agreeable to working with PT on LE exercises this visit; Pt dtr present and encouraging  Follow Up Recommendations  Home health PT     Equipment Recommendations  None recommended by PT    Recommendations for Other Services OT consult     Precautions / Restrictions Precautions Precautions: Fall Restrictions Weight Bearing Restrictions: No    Mobility  Bed Mobility                  Transfers                    Ambulation/Gait                 Stairs            Wheelchair Mobility    Modified Rankin (Stroke Patients Only)       Balance                                    Cognition Arousal/Alertness: Lethargic;Suspect due to medications (quite sleepy but does arouse to  voice and touch) Behavior During Therapy: [facility] for tasks assessed/performed Overall Cognitive Status: Within Functional Limits for tasks assessed                      Exercises General Exercises - Lower Extremity Quad Sets: Strengthening;Both;10 reps;Supine Gluteal Sets: Strengthening;Both;10 reps;Supine Heel Slides: Strengthening;Both;10 reps;Other (comment) (with manual resistance) Hip ABduction/ADduction: Strengthening;Both;10 reps;Supine Straight Leg Raises: Strengthening;AROM;Both;10 reps;Supine Toe Raises: Strengthening;Both;10 reps;Supine;Other (comment) Heel Raises: AROM;Strengthening;Both;Supine (using foot board for resistance)    General Comments        Pertinent Vitals/Pain Pain Assessment: No/denies pain Pain Intervention(s): Monitored during  session;Premedicated before session    Home Living                      Prior Function            PT Goals (current goals can now be found in the care plan section) Acute Rehab PT Goals Patient Stated Goal: Home PT Goal Formulation: With patient Time For Goal Achievement: 05/14/15 Potential to Achieve Goals: Fair Progress towards PT goals: Progressing toward goals    Frequency  Min 3X/week    PT Plan Current plan remains appropriate    Co-evaluation             End of Session   Activity Tolerance: Patient tolerated treatment well Patient left: in bed;with call bell/phone within reach;with bed alarm set;with family/visitor present     Time: 2947-6546 PT Time Calculation (min) (ACUTE ONLY): 15 min  Charges:  $Therapeutic Exercise: 8-22 mins                    G Codes:      Taleigh Gero 05/09/2015, 11:03 AM

## 2015-05-02 NOTE — Progress Notes (Signed)
Patient discharged home in stable condition.  On 2.5L O2 [place].  Discharge instructions provided with verbal understanding.

## 2015-05-02 NOTE — Discharge Summary (Signed)
Physician Discharge Summary  Averie Hornbaker KKX:[id number] DOB: [birth year] DOA: 04/27/2015  PCP: [name], MD  Admit date: 04/27/2015 Discharge date: 05/02/2015  Time spent: 65 minutes  Recommendations for Outpatient Follow-up:  1. Follow-up with [name], MD in 1 week. On follow-up patient needs a basic metabolic profile done to follow-up on electrolytes and renal function. Patient also need a magnesium level checked.   Discharge Diagnoses:  Principal Problem:   Healthcare-associated pneumonia Active Problems:   Metastatic adenocarcinoma to brain, thyroid cancer   Primary papillary thyroid carcinoma ([place])   GERD (gastroesophageal reflux disease)   Mucositis due to radiation therapy   Pneumonia   Hypokalemia   Hypothyroid   Dehydration   Protein-calorie malnutrition, severe   Antineoplastic chemotherapy induced pancytopenia (HCC)   Controlled diabetes mellitus type II without complication (HCC)   PNA (pneumonia)   Hypomagnesemia   HCAP (healthcare-associated pneumonia)   Discharge Condition: Stable and improved  Diet recommendation: Regular  Filed Weights   04/27/15 2027  Weight: 63.2 kg (139 lb 5.3 oz)    History of present illness:  Per Dr Wendee Beavers Henlee Donovan is a 60 y.o. female  With history of thyroid cancer, metastatic adenocarcinoma to brain who is undergoing chemotherapy at [facility]. Patient presented with worsening weakness cough and shortness of breath. The problem had been present for the last 2 weeks and had progressively gotten worse. Patient reported coughing up phlegm which had been discolored. Patient denied coughing up any blood however. Given the persistence in problem patient presented to the ED for further evaluation recommendations.  Patient had difficulty obtaining IV access as such they were not able to obtain CT angiogram of chest in the ED. Plan will be to obtain IV access so we can obtain CT angiogram of chest.  Hospital Course:   #1 healthcare associated pneumonia Patient was admitted with upper respiratory symptoms of productive cough, shortness of breath, weakness. Chest x-ray done on admission was concerning for bilateral lower lobe patchy and peri-bronchovascular airspace consolidation likely representing multifocal pneumonia. Patient was placed empirically on IV vancomycin and IV aztreonam. Sputum Gram stain and cultures were obtained which were negative. Urine Legionella antigen was negative. Urine pneumococcus antigen was negative. CT angiogram chest negative for PE and consistent with multifocal pneumonia. Patient improved clinically and subsequently transitioned to oral Levaquin which she tolerated. Patient was discharged home on 3 more days of oral Levaquin to complete a course of antibiotic treatment. Patient is to follow-up with PCP as outpatient.  #2 chronic hypokalemia/hypomagnesemia Patient was noted during the hospitalization to be hypokalemic and hypomagnesemic. Patient stated she was on oral supplementation of potassium at home chronically. Patient's magnesium was repleted and patient's potassium was repleted. Patient be discharged home on KDur 40 mEq daily will need outpatient follow-up with PCP.   #3 metastatic adenocarcinoma to the brain and thyroid cancer Continued on home regimen of pain medications. Outpatient follow-up.  #4 gastroesophageal reflux disease PPI.  #5 hypothyroidism Continued on Synthroid.  #6 oral thrush Patient was maintained on nystatin.   Procedures:  CT angiogram chest 04/28/2015  Chest x-ray 04/27/2015    Consultations:  None  Discharge Exam: Filed Vitals:   05/02/15 0546 05/02/15 1400  BP: 128/73 124/72  Pulse: 103 102  Temp: 98.1 F (36.7 C) 98.7 F (37.1 C)  Resp: 19     General: NAD Cardiovascular: RRR Respiratory: CTAB  Discharge Instructions   Discharge Instructions    Diet general    Complete by:  As directed  Discharge instructions     Complete by:  As directed   Follow up with [name], MD in 1-2 weeks.     Increase activity slowly    Complete by:  As directed           Current Discharge Medication List    START taking these medications   Details  benzonatate (TESSALON) 100 MG capsule Take 1 capsule (100 mg total) by mouth 3 (three) times daily as needed for cough. Qty: 20 capsule, Refills: 0    clotrimazole (GYNE-LOTRIMIN) 1 % vaginal cream Place 1 Applicatorful vaginally at bedtime. Qty: 45 g, Refills: 0    nystatin (MYCOSTATIN) 100000 UNIT/ML suspension Use as directed 5 mLs (500,000 Units total) in the mouth or throat 4 (four) times daily. Take for 1 week then stop. Qty: 473 mL, Refills: 0      CONTINUE these medications which have CHANGED   Details  levofloxacin (LEVAQUIN) 750 MG tablet Take 1 tablet (750 mg total) by mouth daily. Take for 3 days then stop. Qty: 3 tablet, Refills: 0    potassium chloride 20 MEQ/15ML (10%) SOLN Take 30 mLs (40 mEq total) by mouth daily. Qty: 900 mL, Refills: 0      CONTINUE these medications which have NOT CHANGED   Details  albuterol (PROVENTIL) (2.5 MG/3ML) 0.083% nebulizer solution Take 3 mLs (2.5 mg total) by nebulization every 4 (four) hours as needed for wheezing or shortness of breath. Qty: 75 mL, Refills: 12    ALPRAZolam (XANAX) 0.5 MG tablet Take 0.5 mg by mouth 3 (three) times daily as needed for anxiety.     amitriptyline (ELAVIL) 25 MG tablet Take 1 tablet (25 mg total) by mouth at bedtime. Qty: 30 tablet, Refills: 1    feeding supplement, ENSURE ENLIVE, (ENSURE ENLIVE) LIQD Take 237 mLs by mouth 2 (two) times daily between meals. Qty: 237 mL, Refills: 12    levothyroxine (SYNTHROID, LEVOTHROID) 175 MCG tablet Take 175 mcg by mouth daily before breakfast.    magic mouthwash w/lidocaine SOLN Take 5 mLs by mouth 4 (four) times daily as needed for mouth pain. Qty: 400 mL, Refills: 0    Magnesium 250 MG TABS Take 1 tablet by mouth daily.     ondansetron (ZOFRAN ODT) 8 MG disintegrating tablet Take 1 tablet (8 mg total) by mouth every 8 (eight) hours as needed for nausea or vomiting. Qty: 20 tablet, Refills: 0    Oxycodone HCl 20 MG TABS Take 1 tablet by mouth every 4 (four) hours as needed (pain).    pantoprazole (PROTONIX) 40 MG tablet Take 1 tablet (40 mg total) by mouth 2 (two) times daily. Qty: 60 tablet, Refills: 0    sucralfate (CARAFATE) 1 G tablet Take 1 tablet (1 g total) by mouth 4 (four) times daily -  with meals and at bedtime. Qty: 60 tablet, Refills: 0    valACYclovir (VALTREX) 1000 MG tablet Take 1 tablet (1,000 mg total) by mouth daily. Qty: 10 tablet, Refills: 0    Wound Dressings (SONAFINE) Apply 1 application topically daily.    bisacodyl (DULCOLAX) 10 MG suppository Place 1 suppository (10 mg total) rectally daily as needed for moderate constipation. Qty: 12 suppository, Refills: 0    polyethylene glycol (MIRALAX / GLYCOLAX) packet Take 17 g by mouth every other day. Reported on 04/27/2015      STOP taking these medications     metroNIDAZOLE (FLAGYL) 500 MG tablet        Allergies  Allergen  Reactions  . Penicillins Swelling    " my brain swelled, and mymouth" Has patient had a PCN reaction causing immediate rash, facial/tongue/throat swelling, SOB or lightheadedness with hypotension: Yes Has patient had a PCN reaction causing severe rash involving mucus membranes or skin necrosis: No Has patient had a PCN reaction that required hospitalization No Has patient had a PCN reaction occurring within the last 10 years: No If all of the above answers are "NO", then may proceed with Cephalosporin use.   . Other Swelling    PT ALSO REACTS TO PAPER TAPE AND BAND-AIDS.  Marland Kitchen Codeine Itching    Mild takes benadryl  . Tape Rash   Follow-up Information    Follow up with [name], MD. Schedule an appointment as soon as possible for a visit in 1 week.   Specialty:  Internal Medicine   Why:  f/u in  1-2 weeks   Contact information:   [address] [place] [address] [phone]        The results of significant diagnostics from this hospitalization (including imaging, microbiology, ancillary and laboratory) are listed below for reference.    Significant Diagnostic Studies: Ct Head W Wo Contrast  04/19/2015  CLINICAL DATA:  Altered mental status, history of brain metastasis. Status post LEFT frontal craniotomy. History of metastatic thyroid cancer, borderline diabetes, hypertension. EXAM: CT HEAD WITHOUT AND WITH CONTRAST TECHNIQUE: Contiguous axial images were obtained from the base of the skull through the vertex without and with intravenous contrast CONTRAST:  170m OMNIPAQUE IOHEXOL 300 MG/ML  SOLN COMPARISON:  CT head March 15, 2015 and MRI of the head January 23, 2015 FINDINGS: No enhancing intracranial masses. 7 mm hypodensity in LEFT cerebellum likely represents patient's known metastasis, 15 mm previously. LEFT frontal encephalomalacia was present on prior imaging. No midline shift or mass effect. Ventricles and sulci are normal for age. No abnormal extra-axial fluid collections or extra-axial enhancement. Mild calcific atherosclerosis of the carotid siphons. Old LEFT frontal craniotomy. No skull fracture. Paranasal sinuses and mastoid air cells are well aerated. Ocular globes and orbital contents are normal. Status post RIGHT ocular lens implant. IMPRESSION: No acute intracranial process. No enhancing intracranial masses though, MRI of the brain with contrast would be more sensitive for assessment of residual or new metastasis. 7 mm hypodensity LEFT cerebellum likely representing interval response to treatment of known metastasis. LEFT frontal craniotomy and subjacent encephalomalacia. Electronically Signed   By: CElon AlasM.D.   On: 04/19/2015 00:42   Ct Angio Chest Pe W/cm &/or Wo Cm  04/28/2015  CLINICAL DATA:  Two week history progressively worsening with  weakness, cough, and shortness of breath. History of thyroid carcinoma and metastatic adenocarcinoma to brain [facility]GIOGRAPHY CHEST WITH CONTRAST TECHNIQUE: Multidetector CT imaging of the chest was performed using the standard protocol during bolus administration of intravenous contrast. Multiplanar CT image reconstructions and MIPs were obtained to evaluate the vascular anatomy. CONTRAST:  1021mOMNIPAQUE IOHEXOL 350 MG/ML SOLN COMPARISON:  03/15/2015 FINDINGS: Technically adequate study with good opacification of the central and segmental pulmonary arteries. No focal filling defects demonstrated. No evidence of significant pulmonary embolus. Normal heart size. Small pericardial effusion. Normal caliber thoracic aorta without aneurysm or dissection. Aortic calcification. Great vessel origins are patent. Surgical absence of the thyroid gland. Esophagus is decompressed. No significant hilar, mediastinal, or axillary lymphadenopathy. Mild prominence of retrocrural lymph nodes, measuring up to about 9 mm short axis dimension. These are nonspecific and  could represent reactive or metastatic nodes. Retrocrural nodes are slightly more prominent on previous study. Emphysematous changes in the lungs. Diffuse bronchial thickening and basilar mucous plugging suggesting chronic bronchitis. Patchy airspace infiltrates in the lung bases with more diffuse tree-in-bud infiltrates throughout the lower lungs. Scattered tiny pulmonary nodules. Changes are likely to represent inflammatory process such as bronchopneumonia. Given the history, atypical infection or interstitial metastatic disease are not entirely excluded and follow-up is suggested. Appearances have progressed since the previous study. No pleural effusions. No pneumothorax. Included portions of the upper abdominal organs demonstrate diffuse fatty infiltration of the liver. Degenerative changes in the spine. No destructive  bone lesions. Review of the MIP images confirms the above findings. IMPRESSION: No evidence of significant pulmonary embolus. Patchy airspace disease in the lung bases with diffuse nodular tree-in-bud pattern throughout the lungs likely representing endobronchial bacterial infection, although atypical infection (fungal or atypical mycobacterial) or interstitial metastatic disease not entirely excluded. Findings are progressing since previous study. Chronic bronchitic changes and emphysematous changes in the lungs. Small pericardial effusion. Diffuse fatty infiltration of the liver. Electronically Signed   By: Lucienne Capers M.D.   On: 04/28/2015 19:06   Dg Chest Portable 1 View  04/27/2015  CLINICAL DATA:  Shortness of breath and weakness today. Patient diagnosed with bilateral pneumonia 04/25/2015. EXAM: PORTABLE CHEST 1 VIEW COMPARISON:  04/18/2015 FINDINGS: Cardiomediastinal silhouette is normal. Mediastinal contours appear intact. There is no evidence of pleural effusion or pneumothorax. Bilateral lower lobe linear and patchy airspace consolidation has slightly worsened. Osseous structures are without acute abnormality. Soft tissues are grossly normal. IMPRESSION: Bilateral lower lobe patchy and peribronchovascular airspace consolidation, likely representing multifocal pneumonia. Electronically Signed   By: Fidela Salisbury M.D.   On: 04/27/2015 13:54   Dg Chest Portable 1 View  04/18/2015  CLINICAL DATA:  Fever and shortness of breath for 4 days. Pneumonia. Currently undergoing chemotherapy for brain cancer. EXAM: PORTABLE CHEST 1 VIEW COMPARISON:  04/02/2015 FINDINGS: Mild worsening of bibasilar airspace disease is seen since prior study. Mild cardiomegaly stable. No evidence of pneumothorax or pleural effusion. IMPRESSION: Mild worsening of bibasilar airspace disease, suspicious for pneumonia. Electronically Signed   By: Earle Gell M.D.   On: 04/18/2015 21:08   Dg Swallowing Func-speech  Pathology  04/29/2015  Objective Swallowing Evaluation: Type of Study: MBS-Modified Barium Swallow Study Patient Details Name: Eusebia Grulke MRN: [id number] Date of Birth: October 22, 1955 Today's Date: 04/29/2015 Time: SLP Start Time (ACUTE ONLY): 1215-SLP Stop Time (ACUTE ONLY): 1240 SLP Time Calculation (min) (ACUTE ONLY): 25 min Past Medical History: Past Medical History Diagnosis Date . Essential hypertension  . Anxiety  . Depression  . COPD (chronic obstructive pulmonary disease) ([place])  . GERD (gastroesophageal reflux disease)  . Headache(784.0)  . Arthritis  . Hx of adenomatous colonic polyps  . Emphysema  . Cataract    1 lens implaqnt right eye,intact on left eye . Goiter  . Fibromyalgia  . Pulmonary emboli ([place]) 11/04/11   Right upper lobe and r lower lobe PE . Lung nodule seen on imaging study 11/04/11 CT   22m LLL . D[place]) [place] . [place]ism [place] 12/09[place]2011 . Hyperlipidemia  . History of radiation therapy 10/06/11   [facility] 15Gy 116f brain . UTI (lower urinary tract infection)  . Borderline diabetes mellitus  . [place]   Left frontoparietal region . [place] cancer [place] . S/P radiation therapy 05/31/14 SRS   Brain . PONV (postoperative  nausea and vomiting)  . Controlled diabetes mellitus type II without complication ([place]) 04/54/0981   Previously diagnosed with borderline diabetes. 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 HCAP (healthcare-associated pneumonia) 03/03/2015 . Mucositis due to radiation therapy 03/03/2015 Past Surgical History: Past Surgical History Procedure Laterality Date . Abdominal hysterectomy  1996 . Cholecystectomy  2000 . Shoulder surgery Right 1998 . Eye surgery  2001 . Foot surgery Left 2005 . Craniotomy  09/15/2011   Procedure: CRANIOTOMY TUMOR EXCISION;  Surgeon: Hosie Spangle, MD;  Location: [facility];  Service: Neurosurgery;  Laterality: N/A;  Craniotomy resection of tumor with  stealth . Cataract extraction  2011   With lens implant . Total thyroidectomy  10-29-2013   [facility] . Lymph gland excision   . Video bronchoscopy with endobronchial ultrasound N/A 04/11/2014   Procedure: VIDEO BRONCHOSCOPY WITH ENDOBRONCHIAL ULTRASOUND;  Surgeon: Melrose Nakayama, MD;  Location: [facility];  Service: Thoracic;  Laterality: N/A; . Esophagogastroduodenoscopy (egd) with propofol N/A 03/07/2015   Procedure: ESOPHAGOGASTRODUODENOSCOPY (EGD) WITH PROPOFOL;  Surgeon: Daneil Dolin, MD;  [facility];  Service: Endoscopy;  Laterality: N/A; . Esophageal biopsy  03/07/2015   Procedure: BIOPSY;  Surgeon: Daneil Dolin, MD;  [facility];  Service: Endoscopy;; HPI: 60 year old female with history of thyroid cancer, metastatic adenocarcinoma to brain, who is currently undergoing chemotherapy at [place] continues with increased coughing/weakness.  Pt recently treated for pna and released home but returned due to ongoing symptoms.   Patient had chemotherapy and radiation in November 2016 at that time she was treated for shingles and pneumonia per chart review.  CT scan still showed pneumonia early January.  Spouse and pt report plans for repeat testing to determine if pt needs more chemo and radiation.  Pt has h/o esophagitis/mucositis and thrush that contribute to her dysphagia.  SLP asked to evaluate swallow. Subjective: none stated Assessment / Plan / Recommendation CHL IP CLINICAL IMPRESSIONS 04/29/2015 Therapy Diagnosis Moderate oral phase dysphagia;Mild oral phase dysphagia;Mild pharyngeal phase dysphagia Clinical Impression Mild=moderate oropharyngeal dysphagia WITHOUT aspiration of any consistency tested.  Pt noted to cough several times during MBS without barium visualized in trachea/larynx.  Delayed oral transiting with lingual pumping noted with mild tongue base residuals without pt awareness.  Pharyngeal swallow characterized by decreased tongue  base retraction resulting in mild vallecular/tongue base residual.  Liquid washes aid clearance of pudding/cracker and CUED dry swallows aid clearance of pharynx.  Recommend advance diet to allow thin liquids with strict aspiration precautions.  Using teach back, live video, educated pt and spouse to findings/recommendations.  SLP to follow up for dysphagia management.  Impact on safety and function Moderate aspiration risk   CHL IP TREATMENT RECOMMENDATION 04/29/2015 Treatment Recommendations Therapy as outlined in treatment plan below   Prognosis 04/29/2015 Prognosis for Safe Diet Advancement Guarded Barriers to Reach Goals Cognitive deficits;Severity of deficits Barriers/Prognosis Comment -- CHL IP DIET RECOMMENDATION 04/29/2015 SLP Diet Recommendations Regular solids;Thin liquid Liquid Administration via -- Medication Administration Crushed with puree Compensations Slow rate;Small sips/bites;Multiple dry swallows after each bite/sip;Clear throat intermittently;Follow solids with liquid Postural Changes Seated upright at 90 degrees;Remain semi-upright after after feeds/meals (Comment)   CHL IP OTHER RECOMMENDATIONS 04/29/2015 Recommended Consults -- Oral Care Recommendations Oral care BID Other Recommendations Have oral suction available;Clarify dietary restrictions   CHL IP FOLLOW UP RECOMMENDATIONS 04/29/2015 Follow up Recommendations 24 hour supervision/assistance   CHL IP FREQUENCY AND DURATION 04/29/2015 Speech  Therapy Frequency (ACUTE ONLY) min 2x/week Treatment Duration 1 week      CHL IP ORAL PHASE 04/29/2015 Oral Phase Impaired Oral - Pudding Teaspoon -- Oral - Pudding Cup -- Oral - Honey Teaspoon -- Oral - Honey Cup -- Oral - Nectar Teaspoon Weak lingual manipulation;Lingual pumping;Reduced posterior propulsion;Delayed oral transit Oral - Nectar Cup Weak lingual manipulation;Reduced posterior propulsion;Piecemeal swallowing;Delayed oral transit Oral - Nectar Straw -- Oral - Thin Teaspoon Delayed oral  transit;Weak lingual manipulation;Reduced posterior propulsion;Lingual/palatal residue;Lingual pumping Oral - Thin Cup Lingual pumping;Weak lingual manipulation;Delayed oral transit;Reduced posterior propulsion;Piecemeal swallowing Oral - Thin Straw -- Oral - Puree Lingual pumping;Weak lingual manipulation;Reduced posterior propulsion Oral - Mech Soft Lingual pumping;Weak lingual manipulation;Reduced posterior propulsion;Impaired mastication Oral - Regular -- Oral - Multi-Consistency -- Oral - Pill Lingual pumping;Other (Comment) Oral Phase - Comment pt extended head upward with nectar, ? to aid oral transiting, tongue base residuals without awareness   CHL IP PHARYNGEAL PHASE 04/29/2015 Pharyngeal Phase Impaired Pharyngeal- Pudding Teaspoon -- Pharyngeal -- Pharyngeal- Pudding Cup -- Pharyngeal -- Pharyngeal- Honey Teaspoon -- Pharyngeal -- Pharyngeal- Honey Cup -- Pharyngeal -- Pharyngeal- Nectar Teaspoon Reduced tongue base retraction Pharyngeal -- Pharyngeal- Nectar Cup Reduced tongue base retraction;Delayed swallow initiation-pyriform sinuses Pharyngeal -- Pharyngeal- Nectar Straw -- Pharyngeal -- Pharyngeal- Thin Teaspoon Reduced tongue base retraction Pharyngeal -- Pharyngeal- Thin Cup Reduced tongue base retraction;Delayed swallow initiation-pyriform sinuses;Penetration/Aspiration during swallow Pharyngeal Material enters airway, remains ABOVE vocal cords then ejected out Pharyngeal- Thin Straw Reduced tongue base retraction;Pharyngeal residue - valleculae Pharyngeal -- Pharyngeal- Puree Reduced tongue base retraction;Pharyngeal residue - valleculae Pharyngeal -- Pharyngeal- Mechanical Soft Reduced tongue base retraction;Pharyngeal residue - valleculae Pharyngeal -- Pharyngeal- Regular -- Pharyngeal -- Pharyngeal- Multi-consistency -- Pharyngeal -- Pharyngeal- Pill Pharyngeal residue - valleculae Pharyngeal -- Pharyngeal Comment pt with mild tongue base/vallecular residuals without awareness, following solids  with liquids. dry swallows cued helpful   CHL IP CERVICAL ESOPHAGEAL PHASE 04/29/2015 Cervical Esophageal Phase Impaired Pudding Teaspoon -- Pudding Cup -- Honey Teaspoon -- Honey Cup -- Nectar Teaspoon -- Nectar Cup -- Nectar Straw -- Thin Teaspoon -- Thin Cup -- Thin Straw -- Puree -- Mechanical Soft -- Regular -- Multi-consistency -- Pill -- Cervical Esophageal Comment appearance of slow clearance of pudding below UES without backflow; liquids appeared to facilitate clearance  upon esophageal sweep - pt appeared with coating of barium throughout the esophagus- ? if due to esophagitis/candidiasis  No flowsheet data found. Janett Labella Bridger, [facility] SLP [phone]               Microbiology: Recent Results (from the past 240 hour(s))  Culture, blood (routine x 2) Call MD if unable to obtain prior to antibiotics being given     Status: None (Preliminary result)   Collection Time: 04/27/15 11:33 PM  Result Value Ref Range Status   Specimen Description BLOOD LEFT HAND  Final   Special Requests BOTTLES DRAWN AEROBIC ONLY 5CC  Final   Culture   Final    NO GROWTH 4 DAYS Performed at [facility]    Report Status PENDING  Incomplete  Culture, sputum-assessment     Status: None   Collection Time: 04/28/15  7:45 PM  Result Value Ref Range Status   Specimen Description SPUTUM  Final   Special Requests Immunocompromised  Final   Sputum evaluation   Final    MICROSCOPIC FINDINGS SUGGEST THAT THIS SPECIMEN IS NOT REPRESENTATIVE OF LOWER RESPIRATORY SECRETIONS. PLEASE RECOLLECT. [facility] RN [id number] [id number]  Report Status 04/28/2015 FINAL  Final     Labs: Basic Metabolic Panel:  Recent Labs Lab 04/27/15 1456 04/27/15 2333 04/30/15 0750 05/01/15 0920 05/02/15 0550  NA 142  --   --  143 141  K 4.3  --   --  2.7* 3.6  CL 112*  --   --  110 108  CO2 23  --   --  24 26  GLUCOSE 77  --   --  126* 93  BUN 6  --   --  <5* <5*  CREATININE 0.48 0.49 0.40* 0.36* 0.32*   CALCIUM 8.8*  --   --  7.9* 8.0*  MG  --  1.6*  --  1.4* 1.9  PHOS  --  4.1  --   --   --    Liver Function Tests:  Recent Labs Lab 04/27/15 1456  AST 25  ALT 10*  ALKPHOS 66  BILITOT 0.9  PROT 5.6*  ALBUMIN 3.0*   No results for input(s): LIPASE, AMYLASE in the last 168 hours. No results for input(s): AMMONIA in the last 168 hours. CBC:  Recent Labs Lab 04/27/15 1456 04/27/15 2333 05/02/15 0550  WBC 5.7 5.5 2.8*  NEUTROABS 4.4  --   --   HGB 10.9* 11.6* 11.2*  HCT 34.8* 36.1 37.5  MCV 95.6 94.8 98.9  PLT 233 241 288   Cardiac Enzymes: No results for input(s): CKTOTAL, CKMB, CKMBINDEX, TROPONINI in the last 168 hours. BNP: BNP (last 3 results)  Recent Labs  03/15/15 2100 04/18/15 2031  BNP 81.0 42.0    ProBNP (last 3 results) No results for input(s): PROBNP in the last 8760 hours.  CBG: No results for input(s): GLUCAP in the last 168 hours.     SignedIrine Seal MD.  [facility] 05/02/2015, 2:27 PM

## 2015-05-03 LAB — CULTURE, BLOOD (ROUTINE X 2): CULTURE: NO GROWTH

## 2015-05-04 ENCOUNTER — Emergency Department (HOSPITAL_COMMUNITY): Payer: Medicare Other

## 2015-05-04 ENCOUNTER — Inpatient Hospital Stay (HOSPITAL_COMMUNITY)
Admission: EM | Admit: 2015-05-04 | Discharge: 2015-05-10 | DRG: 178 | Disposition: A | Discharge status: Home Health | Payer: Medicare Other | Attending: Internal Medicine | Admitting: Internal Medicine

## 2015-05-04 ENCOUNTER — Encounter (HOSPITAL_COMMUNITY): Payer: Self-pay | Admitting: *Deleted

## 2015-05-04 DIAGNOSIS — R627 Adult failure to thrive: Secondary | ICD-10-CM | POA: Diagnosis present

## 2015-05-04 DIAGNOSIS — R55 Syncope and collapse: Secondary | ICD-10-CM | POA: Diagnosis not present

## 2015-05-04 DIAGNOSIS — Y92009 Unspecified place in unspecified non-institutional (private) residence as the place of occurrence of the external cause: Secondary | ICD-10-CM

## 2015-05-04 DIAGNOSIS — K219 Gastro-esophageal reflux disease without esophagitis: Secondary | ICD-10-CM | POA: Diagnosis present

## 2015-05-04 DIAGNOSIS — C7931 Secondary malignant neoplasm of brain: Secondary | ICD-10-CM | POA: Diagnosis present

## 2015-05-04 DIAGNOSIS — M797 Fibromyalgia: Secondary | ICD-10-CM | POA: Diagnosis present

## 2015-05-04 DIAGNOSIS — J449 Chronic obstructive pulmonary disease, unspecified: Secondary | ICD-10-CM | POA: Diagnosis present

## 2015-05-04 DIAGNOSIS — Z86711 Personal history of pulmonary embolism: Secondary | ICD-10-CM

## 2015-05-04 DIAGNOSIS — R296 Repeated falls: Secondary | ICD-10-CM | POA: Diagnosis present

## 2015-05-04 DIAGNOSIS — B370 Candidal stomatitis: Secondary | ICD-10-CM | POA: Diagnosis present

## 2015-05-04 DIAGNOSIS — Z923 Personal history of irradiation: Secondary | ICD-10-CM

## 2015-05-04 DIAGNOSIS — E46 Unspecified protein-calorie malnutrition: Secondary | ICD-10-CM | POA: Diagnosis present

## 2015-05-04 DIAGNOSIS — R531 Weakness: Secondary | ICD-10-CM

## 2015-05-04 DIAGNOSIS — C73 Malignant neoplasm of thyroid gland: Secondary | ICD-10-CM | POA: Diagnosis present

## 2015-05-04 DIAGNOSIS — E039 Hypothyroidism, unspecified: Secondary | ICD-10-CM | POA: Diagnosis present

## 2015-05-04 DIAGNOSIS — J690 Pneumonitis due to inhalation of food and vomit: Secondary | ICD-10-CM | POA: Diagnosis not present

## 2015-05-04 DIAGNOSIS — J189 Pneumonia, unspecified organism: Secondary | ICD-10-CM | POA: Diagnosis present

## 2015-05-04 DIAGNOSIS — I10 Essential (primary) hypertension: Secondary | ICD-10-CM | POA: Diagnosis present

## 2015-05-04 DIAGNOSIS — Z87891 Personal history of nicotine dependence: Secondary | ICD-10-CM

## 2015-05-04 DIAGNOSIS — Z6824 Body mass index (BMI) 24.0-24.9, adult: Secondary | ICD-10-CM

## 2015-05-04 DIAGNOSIS — W19XXXA Unspecified fall, initial encounter: Secondary | ICD-10-CM | POA: Diagnosis present

## 2015-05-04 DIAGNOSIS — E119 Type 2 diabetes mellitus without complications: Secondary | ICD-10-CM

## 2015-05-04 DIAGNOSIS — R1319 Other dysphagia: Secondary | ICD-10-CM | POA: Diagnosis present

## 2015-05-04 LAB — COMPREHENSIVE METABOLIC PANEL
ALBUMIN: 2.4 g/dL — AB (ref 3.5–5.0)
ALT: 15 U/L (ref 14–54)
AST: 30 U/L (ref 15–41)
Alkaline Phosphatase: 79 U/L (ref 38–126)
Anion gap: 7 (ref 5–15)
CHLORIDE: 104 mmol/L (ref 101–111)
CO2: 28 mmol/L (ref 22–32)
Calcium: 8.2 mg/dL — ABNORMAL LOW (ref 8.9–10.3)
Creatinine, Ser: 0.53 mg/dL (ref 0.44–1.00)
GFR calc Af Amer: >60 mL/min (ref >60)
GFR calc non Af Amer: >60 mL/min (ref >60)
GLUCOSE: 80 mg/dL (ref 65–99)
POTASSIUM: 3.9 mmol/L (ref 3.5–5.1)
Sodium: 139 mmol/L (ref 135–145)
Total Bilirubin: 0.6 mg/dL (ref 0.3–1.2)
Total Protein: 5 g/dL — ABNORMAL LOW (ref 6.5–8.1)

## 2015-05-04 LAB — URINALYSIS, ROUTINE W REFLEX MICROSCOPIC
Bilirubin Urine: NEGATIVE
GLUCOSE, UA: NEGATIVE mg/dL
Hgb urine dipstick: NEGATIVE
Ketones, ur: NEGATIVE mg/dL
LEUKOCYTES UA: NEGATIVE
Nitrite: NEGATIVE
PH: 7.5 (ref 5.0–8.0)
PROTEIN: NEGATIVE mg/dL
SPECIFIC GRAVITY, URINE: 1.005 (ref 1.005–1.030)

## 2015-05-04 LAB — CBC WITH DIFFERENTIAL/PLATELET
BASOS PCT: 1 %
Basophils Absolute: 0.1 10*3/uL (ref 0.0–0.1)
EOS ABS: 0.1 10*3/uL (ref 0.0–0.7)
EOS PCT: 2 %
HCT: 35.9 % — ABNORMAL LOW (ref 36.0–46.0)
Hemoglobin: 10.9 g/dL — ABNORMAL LOW (ref 12.0–15.0)
LYMPHS PCT: 10 %
Lymphs Abs: 0.5 10*3/uL — ABNORMAL LOW (ref 0.7–4.0)
MCH: 29.7 pg (ref 26.0–34.0)
MCHC: 30.4 g/dL (ref 30.0–36.0)
MCV: 97.8 fL (ref 78.0–100.0)
Monocytes Absolute: 0.6 10*3/uL (ref 0.1–1.0)
Monocytes Relative: 12 %
NEUTROS ABS: 3.6 10*3/uL (ref 1.7–7.7)
Neutrophils Relative %: 75 %
PLATELETS: 274 10*3/uL (ref 150–400)
RBC: 3.67 MIL/uL — ABNORMAL LOW (ref 3.87–5.11)
RDW: 18.7 % — AB (ref 11.5–15.5)
WBC: 4.9 10*3/uL (ref 4.0–10.5)

## 2015-05-04 LAB — I-STAT CG4 LACTIC ACID, ED: Lactic Acid, Venous: 0.7 mmol/L (ref 0.5–2.0)

## 2015-05-04 MED ORDER — ENOXAPARIN SODIUM 40 MG/0.4ML ~~LOC~~ SOLN
40.0000 mg | Freq: Every day | SUBCUTANEOUS | Status: DC
  Administered 2015-05-04 – 2015-05-09 (×6): 40 mg via SUBCUTANEOUS
  Filled 2015-05-04 (×6): qty 0.4

## 2015-05-04 MED ORDER — SODIUM CHLORIDE 0.9 % IV SOLN
INTRAVENOUS | Status: DC
  Administered 2015-05-04 – 2015-05-08 (×6): via INTRAVENOUS

## 2015-05-04 MED ORDER — SUCRALFATE 1 G PO TABS
1.0000 g | ORAL_TABLET | Freq: Four times a day (QID) | ORAL | Status: DC | PRN
  Administered 2015-05-06 – 2015-05-08 (×2): 1 g via ORAL
  Filled 2015-05-04 (×3): qty 1

## 2015-05-04 MED ORDER — MAGNESIUM OXIDE 400 (241.3 MG) MG PO TABS
200.0000 mg | ORAL_TABLET | Freq: Every day | ORAL | Status: DC
  Administered 2015-05-04 – 2015-05-10 (×7): 200 mg via ORAL
  Filled 2015-05-04 (×7): qty 1

## 2015-05-04 MED ORDER — VALACYCLOVIR HCL 500 MG PO TABS
1000.0000 mg | ORAL_TABLET | Freq: Two times a day (BID) | ORAL | Status: DC
  Administered 2015-05-04 – 2015-05-10 (×12): 1000 mg via ORAL
  Filled 2015-05-04 (×14): qty 2

## 2015-05-04 MED ORDER — ONDANSETRON HCL 4 MG PO TABS: 4.0000 mg | ORAL_TABLET | Freq: Four times a day (QID) | ORAL | Status: DC | PRN

## 2015-05-04 MED ORDER — BENZONATATE 100 MG PO CAPS: 100.0000 mg | ORAL_CAPSULE | Freq: Three times a day (TID) | ORAL | Status: DC | PRN

## 2015-05-04 MED ORDER — NYSTATIN 100000 UNIT/ML MT SUSP
5.0000 mL | Freq: Four times a day (QID) | OROMUCOSAL | Status: DC | PRN
  Administered 2015-05-06 – 2015-05-10 (×4): 500000 [IU] via OROMUCOSAL
  Filled 2015-05-04 (×4): qty 5

## 2015-05-04 MED ORDER — POTASSIUM CHLORIDE CRYS ER 20 MEQ PO TBCR
40.0000 meq | EXTENDED_RELEASE_TABLET | Freq: Two times a day (BID) | ORAL | Status: DC
  Administered 2015-05-04 – 2015-05-10 (×12): 40 meq via ORAL
  Filled 2015-05-04 (×12): qty 2

## 2015-05-04 MED ORDER — SODIUM CHLORIDE 0.9 % IV BOLUS (SEPSIS)
1000.0000 mL | Freq: Once | INTRAVENOUS | Status: AC
  Administered 2015-05-04: 1000 mL via INTRAVENOUS

## 2015-05-04 MED ORDER — LEVOTHYROXINE SODIUM 50 MCG PO TABS
175.0000 ug | ORAL_TABLET | Freq: Every day | ORAL | Status: DC
  Administered 2015-05-05 – 2015-05-10 (×6): 175 ug via ORAL
  Filled 2015-05-04 (×6): qty 1

## 2015-05-04 MED ORDER — ONDANSETRON 4 MG PO TBDP: 8.0000 mg | ORAL_TABLET | Freq: Three times a day (TID) | ORAL | Status: DC | PRN

## 2015-05-04 MED ORDER — PANTOPRAZOLE SODIUM 40 MG PO TBEC
40.0000 mg | DELAYED_RELEASE_TABLET | Freq: Two times a day (BID) | ORAL | Status: DC
  Administered 2015-05-04 – 2015-05-10 (×12): 40 mg via ORAL
  Filled 2015-05-04 (×12): qty 1

## 2015-05-04 MED ORDER — SODIUM CHLORIDE 0.9 % IV BOLUS (SEPSIS)
1000.0000 mL | Freq: Once | INTRAVENOUS | Status: AC
Start: 2015-05-04 — End: 2015-05-04
  Administered 2015-05-04: 1000 mL via INTRAVENOUS

## 2015-05-04 MED ORDER — BOOST PO LIQD
237.0000 mL | Freq: Three times a day (TID) | ORAL | Status: DC | PRN
  Filled 2015-05-04: qty 237

## 2015-05-04 MED ORDER — SONAFINE EX EMUL
1.0000 "application " | Freq: Every day | CUTANEOUS | Status: DC
  Administered 2015-05-04 – 2015-05-10 (×6): 1 via TOPICAL
  Filled 2015-05-04: qty 45

## 2015-05-04 MED ORDER — CLOTRIMAZOLE 1 % VA CREA
1.0000 | TOPICAL_CREAM | Freq: Every evening | VAGINAL | Status: DC | PRN
  Administered 2015-05-04: 1 via VAGINAL
  Filled 2015-05-04: qty 45

## 2015-05-04 MED ORDER — LEVOFLOXACIN 750 MG PO TABS
750.0000 mg | ORAL_TABLET | Freq: Every day | ORAL | Status: DC
  Administered 2015-05-05 – 2015-05-06 (×2): 750 mg via ORAL
  Filled 2015-05-04 (×2): qty 1

## 2015-05-04 MED ORDER — ALPRAZOLAM 1 MG PO TABS
1.0000 mg | ORAL_TABLET | Freq: Three times a day (TID) | ORAL | Status: DC | PRN
  Administered 2015-05-04 – 2015-05-09 (×13): 1 mg via ORAL
  Filled 2015-05-04 (×14): qty 1

## 2015-05-04 MED ORDER — OXYCODONE HCL 5 MG PO TABS
20.0000 mg | ORAL_TABLET | ORAL | Status: DC | PRN
  Administered 2015-05-04 – 2015-05-10 (×30): 20 mg via ORAL
  Filled 2015-05-04 (×30): qty 4

## 2015-05-04 MED ORDER — MAGIC MOUTHWASH W/LIDOCAINE
5.0000 mL | Freq: Four times a day (QID) | ORAL | Status: DC | PRN
  Administered 2015-05-07: 5 mL via ORAL
  Filled 2015-05-04 (×2): qty 5

## 2015-05-04 MED ORDER — SODIUM CHLORIDE 0.9% FLUSH
3.0000 mL | Freq: Two times a day (BID) | INTRAVENOUS | Status: DC
  Administered 2015-05-04 – 2015-05-10 (×6): 3 mL via INTRAVENOUS

## 2015-05-04 MED ORDER — ALBUTEROL SULFATE (2.5 MG/3ML) 0.083% IN NEBU: 2.5000 mg | INHALATION_SOLUTION | RESPIRATORY_TRACT | Status: DC | PRN

## 2015-05-04 MED ORDER — POLYETHYLENE GLYCOL 3350 17 G PO PACK: 17.0000 g | PACK | Freq: Every day | ORAL | Status: DC | PRN

## 2015-05-04 MED ORDER — ONDANSETRON HCL 4 MG/2ML IJ SOLN: 4.0000 mg | Freq: Four times a day (QID) | INTRAMUSCULAR | Status: DC | PRN

## 2015-05-04 NOTE — ED Notes (Signed)
PA at bedside.

## 2015-05-04 NOTE — H&P (Signed)
[facility] History and Physical  Haley Lowe KKX:[id number] DOB: [birth year] DOA: 05/04/2015  Referring physician: Harrell Gave lawyer, PA-C PCP: [name], MD   Chief Complaint: Weakness.  HPI: Haley Lowe is a 60 y.o. female with a past medical history of essential hypertension, anxiety, depression, COPD, GERD, posterior arthritis, fibromyalgia, DVT/PE, metastatic thyroid cancer who was discharged 2 days ago from the hospital for healthcare associated pneumonia and is brought to the emergency department after her daughter reports increased weakness since yesterday.  Her daughter stated that she was able to ambulate on Thursday and Friday after being discharged, but since yesterday, the patient has had some difficulty ambulating. She tried to go to the bathroom by herself, became dizzy and fell on the floor hitting her head on the left occipital area and her left hip area. Patient denies fever, chills, chest pain, palpitations, diaphoresis, nausea or emesis.   Her daughter states that the patient has been having trouble keeping up fluid and food intake at home. She states that despite the best effort from the patient to keep herself hydrated, she is struggling to do this. Her daughter was inquiring about the possibility of a PICC line to have IV hydration at home, but I pointed out that this would only be a temporary solution.   When seen in the emergency department, patient was in no acute distress. Workup reveals a normal UA, unremarkable chemistry, mild anemia, on change chest radiograph infiltrates, no acute findings on CT scan.  Review of Systems:  Constitutional:  Positive fatigue.  No weight loss, night sweats, Fevers, chills.  HEENT:  Positive occipital chronic headaches, Difficulty swallowing,Tooth/dental problems,Sore throat,  No sneezing, itching, ear ache, nasal congestion, post nasal drip,  Cardio-vascular:  Positive dizziness No chest pain, Orthopnea, PND,  swelling in lower extremities, anasarca,  palpitations  GI:  Positive change in bowel habits, loss of appetite  No heartburn, indigestion, abdominal pain, nausea, vomiting, diarrhea, Resp:  Positive dyspnea, positive productive cough (which is improving as per the daughter), no wheezing, no hemoptysis. Skin:  no rash or lesions.  GU:  no dysuria, change in color of urine, no urgency or frequency. No flank pain.  Musculoskeletal:  No joint pain or swelling. No decreased range of motion. No back pain.  Psych:  No change in mood or affect. No depression or anxiety. No memory loss.   Past Medical History  Diagnosis Date  . Essential hypertension   . Anxiety   . Depression   . COPD (chronic obstructive pulmonary disease) ([place])   . GERD (gastroesophageal reflux disease)   . Headache(784.0)   . Arthritis   . Hx of adenomatous colonic polyps   . Emphysema   . Cataract     1 lens implaqnt right eye,intact on left eye  . Goiter   . Fibromyalgia   . Pulmonary emboli ([place]) 11/04/11    Right upper lobe and r lower lobe PE  . Lung nodule seen on imaging study 11/04/11 CT    106m LLL  . DVT (deep venous thrombosis) ([facility] 12/09/2011  . [place]monary embolism ([facility] 12/09/2011  . Hyperlipidemia   . History of radiation therapy 10/06/11    SRS 15Gy 16f brain  . UTI (lower urinary tract infection)   . Borderline diabetes mellitus   . Metast[place] [facility] [place]13    Left frontoparietal region  . Thyroid cancer ([facility]  . S/P radiation therapy 05/31/14 SRS    Brain  . PONV (postoperative nausea and  vomiting)   . Controlled diabetes mellitus type II without complication ([place]) 18/56/3149    Previously diagnosed with borderline diabetes.  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 HCAP (healthcare-associated pneumonia) 03/03/2015  . Mucositis due to radiation therapy 03/03/2015   Past Surgical History  Procedure Laterality Date  . Abdominal  hysterectomy  1996  . Cholecystectomy  2000  . Shoulder surgery Right 1998  . Eye surgery  2001  . Foot surgery Left 2005  . Craniotomy  09/15/2011    Procedure: CRANIOTOMY TUMOR EXCISION;  Surgeon: Hosie Spangle, MD;  Location: [facility];  Service: Neurosurgery;  Laterality: N/A;  Craniotomy resection of tumor with stealth  . Cataract extraction  2011    With lens implant  . Total thyroidectomy  10-29-2013    [facility]  . Lymph gland excision    . Video bronchoscopy with endobronchial ultrasound N/A 04/11/2014    Procedure: VIDEO BRONCHOSCOPY WITH ENDOBRONCHIAL ULTRASOUND;  Surgeon: Melrose Nakayama, MD;  Location: [facility];  Service: Thoracic;  Laterality: N/A;  . Esophagogastroduodenoscopy (egd) with propofol N/A 03/07/2015    Procedure: ESOPHAGOGASTRODUODENOSCOPY (EGD) WITH PROPOFOL;  Surgeon: Daneil Dolin, MD;  Location[facility];  Service: Endoscopy;  Laterality: N/A;  . Biopsy  03/07/2015    Procedure: BIOPSY;  Surgeon: Daneil Dolin, MD;  [facility]: [facility];  Service: Endoscopy;;   Social History:  reports that she quit smoking about 4 years ago. Her smoking use included Cigarettes. She has a 40 pack-year smoking history. She has never used smokeless tobacco. She reports that she does not drink alcohol or use illicit drugs.  Allergies  Allergen Reactions  . Penicillins Swelling    " my brain swelled, and mymouth" Has patient had a PCN reaction causing immediate rash, facial/tongue/throat swelling, SOB or lightheadedness with hypotension: Yes Has patient had a PCN reaction causing severe rash involving mucus membranes or skin necrosis: No Has patient had a PCN reaction that required hospitalization No Has patient had a PCN reaction occurring within the last 10 years: No If all of the above answers are "NO", then may proceed with Cephalosporin use.   . Other Swelling    PT ALSO REACTS TO PAPER TAPE AND BAND-AIDS.  Marland Kitchen Codeine Itching     Mild takes benadryl  . Potassium-Containing Compounds Other (See Comments)    Potassium Liquid only- burns the mouth and throat, pt is able to take tablets dissolved.  . Tape Rash    Family History  Problem Relation Age of Onset  . Asthma Mother   . Kidney failure Father   . Diabetes Sister   . Heart attack Sister   . Colon cancer Brother   . COPD Sister   . Aneurysm Paternal Grandmother     Brain  . Parkinsonism Maternal Uncle   . COPD Brother     Prior to Admission medications   Medication Sig Start Date End Date Taking? Authorizing Provider  albuterol (PROVENTIL) (2.5 MG/3ML) 0.083% nebulizer solution Take 3 mLs (2.5 mg total) by nebulization every 4 (four) hours as needed for wheezing or shortness of breath. 04/25/15  Yes Orvan Falconer, MD  ALPRAZolam Duanne Moron) 1 MG tablet Take 1 mg by mouth 3 (three) times daily as needed for anxiety.   Yes Historical Provider, MD  amitriptyline (ELAVIL) 50 MG tablet Take 50 mg by mouth at bedtime.   Yes Historical Provider, MD  benzonatate (TESSALON) 100 MG  capsule Take 1 capsule (100 mg total) by mouth 3 (three) times daily as needed for cough. 05/02/15  Yes Eugenie Filler, MD  clotrimazole (GYNE-LOTRIMIN) 1 % vaginal cream Place 1 Applicatorful vaginally at bedtime. Patient taking differently: Place 1 Applicatorful vaginally at bedtime as needed (for vaginal discomfort and itching).  05/02/15  Yes Eugenie Filler, MD  lactose free nutrition (BOOST) LIQD Take 237 mLs by mouth 3 (three) times daily as needed (for poor appetite).   Yes Historical Provider, MD  levofloxacin (LEVAQUIN) 750 MG tablet Take 1 tablet (750 mg total) by mouth daily. Take for 3 days then stop. 05/02/15  Yes Eugenie Filler, MD  levothyroxine (SYNTHROID, LEVOTHROID) 175 MCG tablet Take 175 mcg by mouth daily before breakfast.   Yes Historical Provider, MD  magic mouthwash w/lidocaine SOLN Take 5 mLs by mouth 4 (four) times daily as needed for mouth pain. 03/13/15  Yes Velvet Bathe, MD  Magnesium 250 MG TABS Take 1 tablet by mouth daily.   Yes Historical Provider, MD  nystatin (MYCOSTATIN) 100000 UNIT/ML suspension Use as directed 5 mLs (500,000 Units total) in the mouth or throat 4 (four) times daily. Take for 1 week then stop. Patient taking differently: Use as directed 5 mLs in the mouth or throat 4 (four) times daily as needed (for thrush). Take for 1 week then stop. 05/02/15  Yes Eugenie Filler, MD  ondansetron (ZOFRAN ODT) 8 MG disintegrating tablet Take 1 tablet (8 mg total) by mouth every 8 (eight) hours as needed for nausea or vomiting. 04/05/14  Yes Pattricia Boss, MD  Oxycodone HCl 20 MG TABS Take 1 tablet by mouth every 4 (four) hours as needed (pain).   Yes Historical Provider, MD  pantoprazole (PROTONIX) 40 MG tablet Take 1 tablet (40 mg total) by mouth 2 (two) times daily. 03/13/15  Yes Velvet Bathe, MD  polyethylene glycol (MIRALAX / GLYCOLAX) packet Take 17 g by mouth daily as needed for moderate constipation. Reported on 04/27/2015   Yes Historical Provider, MD  potassium chloride SA (K-DUR,KLOR-CON) 20 MEQ tablet Take 40 mEq by mouth 2 (two) times daily. Dissolve in water and take either in applesauce or drink the water.   Yes Historical Provider, MD  sucralfate (CARAFATE) 1 G tablet Take 1 tablet (1 g total) by mouth 4 (four) times daily -  with meals and at bedtime. Patient taking differently: Take 1 g by mouth 4 (four) times daily as needed (stomach coating before meals).  03/13/15  Yes Velvet Bathe, MD  valACYclovir (VALTREX) 1000 MG tablet Take 1 tablet (1,000 mg total) by mouth daily. 04/25/15  Yes Orvan Falconer, MD  Wound Dressings (SONAFINE) Apply 1 application topically daily.   Yes Historical Provider, MD  amitriptyline (ELAVIL) 25 MG tablet Take 1 tablet (25 mg total) by mouth at bedtime. Patient not taking: Reported on 05/04/2015 04/25/15   Orvan Falconer, MD  bisacodyl (DULCOLAX) 10 MG suppository Place 1 suppository (10 mg total) rectally daily as needed for  moderate constipation. Patient not taking: Reported on 04/27/2015 04/25/15   Orvan Falconer, MD  feeding supplement, ENSURE ENLIVE, (ENSURE ENLIVE) LIQD Take 237 mLs by mouth 2 (two) times daily between meals. Patient not taking: Reported on 05/04/2015 04/25/15   Orvan Falconer, MD  potassium chloride 20 MEQ/15ML (10%) SOLN Take 30 mLs (40 mEq total) by mouth daily. Patient not taking: Reported on 05/04/2015 05/02/15   Eugenie Filler, MD   Physical Exam: Filed Vitals:   05/04/15 1740  05/04/15 1745 05/04/15 1830 05/04/15 2015  BP: 103/60 90/55 101/64 110/75  Pulse: 82 76 99 100  Temp:    97.9 F (36.6 C)  TempSrc:    Oral  Resp: '18 23 20 22  '$ Height:    '5\' 4"'$  (1.626 m)  Weight:    64.501 kg (142 lb 3.2 oz)  SpO2: 96% 93% 97% 96%    Wt Readings from Last 3 Encounters:  05/04/15 64.501 kg (142 lb 3.2 oz)  04/27/15 63.2 kg (139 lb 5.3 oz)  04/24/15 63.4 kg (139 lb 12.4 oz)    General:  Appears calm and comfortable Head: Left occipital area shows a small laceration and contusion. Right occipital and posterior cervical area shows erythema in the area where the patient gets herpes zoster recurrences. Eyes: PERRL, normal lids, irises & conjunctiva ENT: grossly normal hearing, lips and oral mucosa are moist Neck: no LAD, masses or thyromegaly Cardiovascular: RRR, no m/r/g. No LE edema. Telemetry: SR, no arrhythmias  Respiratory: Decreased breath sounds bilaterally, bibasilar rales. Mild rhonchi bilaterally.          No accessory muscle use Abdomen: soft, ntnd Skin: no rash or induration seen on limited exam Musculoskeletal: Decreased on and weakness of BUE/BLE Psychiatric: Her mood seemed depressed and her affect seemed flat, speech fluent and appropriate Neurologic: Awake, alert, oriented 3, grossly non-focal, generalized weakness. .          Labs on Admission:  Basic Metabolic Panel:  Recent Labs Lab 04/27/15 2333 04/30/15 0750 05/01/15 0920 05/02/15 0550 05/04/15 1604  NA  --   --   143 141 139  K  --   --  2.7* 3.6 3.9  CL  --   --  110 108 104  CO2  --   --  '24 26 28  '$ GLUCOSE  --   --  126* 93 80  BUN  --   --  <5* <5* <5*  CREATININE 0.49 0.40* 0.36* 0.32* 0.53  CALCIUM  --   --  7.9* 8.0* 8.2*  MG 1.6*  --  1.4* 1.9  --   PHOS 4.1  --   --   --   --    Liver Function Tests:  Recent Labs Lab 05/04/15 1604  AST 30  ALT 15  ALKPHOS 79  BILITOT 0.6  PROT 5.0*  ALBUMIN 2.4*   CBC:  Recent Labs Lab 04/27/15 2333 05/02/15 0550 05/04/15 1604  WBC 5.5 2.8* 4.9  NEUTROABS  --   --  3.6  HGB 11.6* 11.2* 10.9*  HCT 36.1 37.5 35.9*  MCV 94.8 98.9 97.8  PLT 241 288 274    BNP (last 3 results)  Recent Labs  03/15/15 2100 04/18/15 2031  BNP 81.0 42.0     Radiological Exams on Admission: Dg Chest 2 View  05/04/2015  CLINICAL DATA:  Tripped over object and fell at home today. Right-sided chest pain. Shortness of breath. COPD. EXAM: CHEST  2 VIEW COMPARISON:  04/27/2015 FINDINGS: Heart size remains within normal limits. Bibasilar pulmonary infiltrates, left side greater than right, show no significant change. No evidence of pneumothorax or pleural effusion. IMPRESSION: Bibasilar pulmonary infiltrates, without significant interval change. Electronically Signed   By: Earle Gell M.D.   On: 05/04/2015 16:34   Dg Pelvis 1-2 Views  05/04/2015  CLINICAL DATA:  Fall.  Pelvic pain.  Initial encounter. EXAM: PELVIS - 1-2 VIEW COMPARISON:  None. FINDINGS: Residual contrast material is seen within the rectosigmoid colon. This partially  obscures visualization of the pelvis, however no definite pelvic fracture or other focal bone lesion identified. No evidence of pelvic joint diastasis. IMPRESSION: No pelvic fracture identified. Visualization limited by residual contrast material in rectosigmoid colon. Electronically Signed   By: Earle Gell M.D.   On: 05/04/2015 16:41   Ct Head Wo Contrast  05/04/2015  CLINICAL DATA:  Altered mental status and increase weakness over  past 2 days. Metastatic thyroid cancer to brain. Previous craniotomy and radiation therapy. EXAM: CT HEAD WITHOUT CONTRAST TECHNIQUE: Contiguous axial images were obtained from the base of the skull through the vertex without intravenous contrast. COMPARISON:  04/18/2015 FINDINGS: There is no evidence of intracranial hemorrhage, brain edema, or other signs of acute infarction. No abnormal extraaxial fluid collections are identified. A 7 mm low-attenuation lesion in the left cerebellum remains stable, consistent with treated metastasis. No other intra-axial mass identified. No evidence of mass effect. Mild left parietal encephalomalacia remains stable with adjacent left craniotomy defect. Mild chronic small vessel disease again noted. IMPRESSION: Stable exam.  No acute intracranial findings. Stable 7 mm low-attenuation lesion in left cerebellum, consistent with treated metastasis. Electronically Signed   By: Earle Gell M.D.   On: 05/04/2015 16:55    Echocardiogram 07/29/2014  ------------------------------------------------------------------- LV EF: 60% -  65%  ------------------------------------------------------------------- Indications:   Chest pain 786.51.  ------------------------------------------------------------------- History:  PMH: Chest Tightness, Adenocarcinoma, Pulmonary Embolism. Angina pectoris. Chronic obstructive pulmonary disease.  ------------------------------------------------------------------- Study Conclusions  - Left ventricle: The cavity size was normal. Wall thickness was increased in a pattern of mild LVH. Systolic function was normal. The estimated ejection fraction was in the range of 60% to 65%. Wall motion was normal; there were no regional wall motion abnormalities. Doppler parameters are consistent with abnormal left ventricular relaxation (grade 1 diastolic dysfunction). - Aortic valve: Poorly visualized. - Right atrium: Central venous  pressure (est): 3 mm Hg. - Tricuspid valve: There was physiologic regurgitation. - Pulmonary arteries: Systolic pressure could not be accurately estimated. - Pericardium, extracardiac: A small pericardial effusion was identified circumferential to the heart. Evidence of organization noted. No obvious RV compression to indicate hemodynamic significance.  Impressions:  - Mild LVH with LVEF 62-69%, grade 1 diastolic dysfunction. A small pericardial effusion was identified circumferential to the heart. Evidence of organization noted - question chronicity. No obvious RV compression to indicate hemodynamic significance.   Assessment/Plan Principal Problem:   Near syncope   Weakness Likely due to physical deconditioning. Admit to observation. Continue cardiac monitoring. Continue IV hydration. Consult PT/OT in the morning. Hold amitriptyline.  Consider tapering and/or switching to a different agent if dizziness improves.  Active Problems:     HCAP (healthcare-associated pneumonia) Continue levofloxacin 750 mg by mouth daily.    Metastatic adenocarcinoma to brain, thyroid cancer Continue treatment as per oncology.    COPD (chronic obstructive pulmonary disease) (HCC) Continue albuterol nebulizer therapy as needed.    Controlled diabetes mellitus type II without complication (HCC) Carbohydrate modified diet. CBG monitoring before meals.     Recurrent herpes zoster Increase valacyclovir to 1000 mg by mouth twice a day     Code Status: Full code. DVT Prophylaxis: Lovenox SQ. Family Communication: Daughter was present in the room. Disposition Plan: Admit to telemetry for further evaluation, treatment and monitoring.  Time spent: Over 70 minutes were used in the process of this admission.  Reubin Milan [facility] Pager [phone].

## 2015-05-04 NOTE — ED Notes (Addendum)
Patient comes from home after being d/c'd from hospital after pneumonia dx last Friday (1/26).  Patient's family stated patient could walk on Friday after discharge and seemed okay, but yesterday and today patient has been unable walk and increasingly confused.  Patient fell and hit back of her head last night at home.  Patient also c/o left hip and right rib pain since fall.  Patient denies N/V/F.  Patient c/o neck pain, which is baseline for her.  Patient took oxycodone 20 mg and Tylenol for pain @ 1 pm with no relief.  Patient's family states patient's urine output has greatly increased over last day, but deny hematuria and malodorous urine.

## 2015-05-04 NOTE — ED Notes (Signed)
Awake. Verbally responsive. A/O x4. Resp even and unlabored. No audible adventitious breath sounds noted. ABC's intact. IV saline lock patent and intact. Family at bedside.

## 2015-05-04 NOTE — ED Notes (Signed)
Dr Ortiz at bedside 

## 2015-05-04 NOTE — ED Notes (Signed)
Pt sleeping quietly at this time.Per family, pt has had increased weakness over the past 2 days. Pt family adds that pt is acting more confused than normal. Pt in NAD and VSS

## 2015-05-05 ENCOUNTER — Observation Stay (HOSPITAL_COMMUNITY): Payer: Medicare Other

## 2015-05-05 DIAGNOSIS — R55 Syncope and collapse: Secondary | ICD-10-CM | POA: Diagnosis not present

## 2015-05-05 LAB — GLUCOSE, CAPILLARY
GLUCOSE-CAPILLARY: 87 mg/dL (ref 65–99)
Glucose-Capillary: 77 mg/dL (ref 65–99)
Glucose-Capillary: 78 mg/dL (ref 65–99)

## 2015-05-05 LAB — CBC WITH DIFFERENTIAL/PLATELET
Basophils Absolute: 0 10*3/uL (ref 0.0–0.1)
Basophils Relative: 1 %
EOS ABS: 0.1 10*3/uL (ref 0.0–0.7)
Eosinophils Relative: 3 %
HEMATOCRIT: 34.5 % — AB (ref 36.0–46.0)
HEMOGLOBIN: 10.5 g/dL — AB (ref 12.0–15.0)
LYMPHS ABS: 0.6 10*3/uL — AB (ref 0.7–4.0)
Lymphocytes Relative: 16 %
MCH: 30.4 pg (ref 26.0–34.0)
MCHC: 30.4 g/dL (ref 30.0–36.0)
MCV: 100 fL (ref 78.0–100.0)
MONOS PCT: 7 %
Monocytes Absolute: 0.3 10*3/uL (ref 0.1–1.0)
NEUTROS ABS: 2.8 10*3/uL (ref 1.7–7.7)
NEUTROS PCT: 73 %
Platelets: 249 10*3/uL (ref 150–400)
RBC: 3.45 MIL/uL — AB (ref 3.87–5.11)
RDW: 19.1 % — ABNORMAL HIGH (ref 11.5–15.5)
WBC: 3.9 10*3/uL — AB (ref 4.0–10.5)

## 2015-05-05 MED ORDER — ENSURE ENLIVE PO LIQD
237.0000 mL | Freq: Two times a day (BID) | ORAL | Status: DC
  Administered 2015-05-05 – 2015-05-10 (×7): 237 mL via ORAL

## 2015-05-05 MED ORDER — RESOURCE THICKENUP CLEAR PO POWD
Freq: Once | ORAL | Status: AC
  Administered 2015-05-05: 16:00:00 via ORAL
  Filled 2015-05-05: qty 125

## 2015-05-05 MED ORDER — GADOBENATE DIMEGLUMINE 529 MG/ML IV SOLN
15.0000 mL | Freq: Once | INTRAVENOUS | Status: AC | PRN
  Administered 2015-05-05: 13 mL via INTRAVENOUS

## 2015-05-05 MED ORDER — LORAZEPAM 2 MG/ML IJ SOLN
0.5000 mg | Freq: Once | INTRAMUSCULAR | Status: AC
  Administered 2015-05-05: 0.5 mg via INTRAVENOUS
  Filled 2015-05-05: qty 1

## 2015-05-05 MED ORDER — ACETAMINOPHEN 325 MG PO TABS
650.0000 mg | ORAL_TABLET | Freq: Four times a day (QID) | ORAL | Status: DC | PRN
Start: 2015-05-05 — End: 2015-05-10
  Administered 2015-05-05 – 2015-05-10 (×9): 650 mg via ORAL
  Filled 2015-05-05 (×9): qty 2

## 2015-05-05 NOTE — Evaluation (Signed)
Physical Therapy Evaluation Patient Details Name: Haley Lowe MRN: [id number] DOB: Sep 25, 1955 Today's Date: 05/05/2015   History of Present Illness  60 yo female admitted with near syncope, fall, confusion. Hx of COPD-O2 dep, thyroid cancer, brain mets, fibromyalgia, HTN, DM  Clinical Impression  On eval, pt required Mod assist +2 for mobility. Sat EOB ~1-2 minutes prior to standing. Stood at [facility] briefly before taking a few lateral steps towards HOB. Mod multimodal cueing for all tasks. Increased time and some difficulty responding to questions/cueing. Husband stepped out of room at start of eval-unable to discuss d/c plan with him. At this time, recommend SNF.     Follow Up Recommendations SNF (unless husband feels he can manage pt at current level)    Equipment Recommendations  None recommended by PT    Recommendations for Other Services OT consult     Precautions / Restrictions Precautions Precautions: Fall Precaution Comments: O2 dep Restrictions Weight Bearing Restrictions: No      Mobility  Bed Mobility Overal bed mobility: Needs Assistance Bed Mobility: Supine to Sit;Sit to Supine     Supine to sit: Mod assist;+2 for physical assistance;+2 for safety/equipment;HOB elevated Sit to supine: Mod assist;+2 for physical assistance;+2 for safety/equipment;HOB elevated   General bed mobility comments: Assist for trunk and bil LEs. Increased time. Multiomodal cueing for initiation/completion of task. Utilized bedpad for scooting, positioning  Transfers Overall transfer level: Needs assistance Equipment used: Rolling walker (2 wheeled) Transfers: Sit to/from Stand Sit to Stand: Mod assist;+2 physical assistance;+2 safety/equipment         General transfer comment: Assist to rise, stabilize, control descent. Multimodal cueing for safety, hand placement. Posterior leaning.   Ambulation/Gait             General Gait Details: Side steps towards HOB after standing  with RW. Increased time. Assist to stabilize pt and move walker. Will continue to assess ability to ambulate next session  Stairs            Wheelchair Mobility    Modified Rankin (Stroke Patients Only)       Balance   Sitting-balance support: Feet supported;Bilateral upper extremity supported Sitting balance-Leahy Scale: Fair     Standing balance support: Bilateral upper extremity supported;During functional activity Standing balance-Leahy Scale: Poor                               Pertinent Vitals/Pain Pain Assessment: Faces Faces Pain Scale: Hurts even more Pain Location: noted during bed mobility-unsure of exact location Pain Descriptors / Indicators: Grimacing Pain Intervention(s): Monitored during session;Repositioned    Home Living Family/patient expects to be discharged to:: Unsure Living Arrangements: Spouse/significant other Available Help at Discharge: Family Type of Home: House Home Access: Stairs to enter Entrance Stairs-Rails: None Entrance Stairs-Number of Steps: 4 Home Layout: One level Home Equipment: Environmental consultant - 2 wheels;Cane - single point      Prior Function Level of Independence: Needs assistance   Gait / Transfers Assistance Needed: ambulatory prior to this admission     Comments: able to be independent when feeling well, husband available to assist PRN     Hand Dominance        Extremity/Trunk Assessment   Upper Extremity Assessment: Generalized weakness           Lower Extremity Assessment: Generalized weakness      Cervical / Trunk Assessment: Normal  Communication   Communication: No difficulties  Cognition Arousal/Alertness:  Lethargic   Overall Cognitive Status: Impaired/Different from baseline Area of Impairment: Following commands;Orientation;Memory;Problem solving Orientation Level: Disoriented to;Place;Time;Situation   Memory: Decreased short-term memory Following Commands: Follows one step  commands with increased time     Problem Solving: Requires tactile cues;Requires verbal cues;Difficulty sequencing;Slow processing;Decreased initiation      General Comments      Exercises        Assessment/Plan    PT Assessment Patient needs continued PT services  PT Diagnosis Difficulty walking;Generalized weakness;Altered mental status   PT Problem List Decreased strength;Decreased activity tolerance;Decreased balance;Pain;Decreased cognition;Decreased mobility;Decreased knowledge of use of DME;Decreased safety awareness  PT Treatment Interventions DME instruction;Gait training;Functional mobility training;Therapeutic activities;Patient/family education;Balance training;Therapeutic exercise   PT Goals (Current goals can be found in the Care Plan section) Acute Rehab PT Goals Patient Stated Goal: none stated PT Goal Formulation: Patient unable to participate in goal setting Time For Goal Achievement: 05/19/15 Potential to Achieve Goals: Fair    Frequency Min 3X/week   Barriers to discharge        Co-evaluation               End of Session Equipment Utilized During Treatment: Oxygen Activity Tolerance: Patient limited by fatigue;Patient limited by pain Patient left: in bed;with call bell/phone within reach;with bed alarm set      Functional Assessment Tool Used: clinical judgement Functional Limitation: Mobility: Walking and moving around Mobility: Walking and Moving Around Current Status (J6701): At least 40 percent but less than 60 percent impaired, limited or restricted Mobility: Walking and Moving Around Goal Status [phone]): At least 20 percent but less than 40 percent impaired, limited or restricted    Time: 9611-6435 PT Time Calculation (min) (ACUTE ONLY): 8 min   Charges:   PT Evaluation $PT Eval Moderate Complexity: 1 Procedure     PT G Codes:   PT G-Codes **NOT FOR INPATIENT CLASS** Functional Assessment Tool Used: clinical  judgement Functional Limitation: Mobility: Walking and moving around Mobility: Walking and Moving Around Current Status (T9122): At least 40 percent but less than 60 percent impaired, limited or restricted Mobility: Walking and Moving Around Goal Status [phone]): At least 20 percent but less than 40 percent impaired, limited or restricted    Weston Anna, MPT [phone]

## 2015-05-05 NOTE — Evaluation (Signed)
Clinical/Bedside Swallow Evaluation Patient Details  Name: Haley Lowe MRN: [id number] Date of Birth: 12-03-1955  Today's Date: 05/05/2015 Time: SLP Start Time (ACUTE ONLY): 1232 SLP Stop Time (ACUTE ONLY): 1300 SLP Time Calculation (min) (ACUTE ONLY): 28 min  Past Medical History:  Past Medical History  Diagnosis Date  . Essential hypertension   . Anxiety   . Depression   . COPD (chronic obstructive pulmonary disease) ([place])   . GERD (gastroesophageal reflux disease)   . Headache(784.0)   . Arthritis   . Hx of adenomatous colonic polyps   . Emphysema   . Cataract     1 lens implaqnt right eye,intact on left eye  . Goiter   . Fibromyalgia   . Pulmonary emboli ([place]) 11/04/11    Right upper lobe and r lower lobe PE  . Lung nodule seen on imaging study 11/04/11 CT    25m LLL  . DVT (deep venous thrombosis) ([facility] 12/09/2011  . [place] 12/09/2011  . Hyperlipidemia   . History of radiation therapy 10/06/11    S[facility] 15Gy 166f brain  . UTI (lower urinary tract infection)   . Borderline diabetes mellitus   . Metastatic adenocarcinoma to brain ([facility]6/12/13    Left frontoparietal region  . Thyroid cancer ([place]  . S/P radiation therapy 05/31/14 SRS    Brain  . PONV (postoperative nausea and vomiting)   . Controlled diabetes mellitus type II without complication ([facility]1201/05/7251  Previously diagnosed with borderline diabetes.  . Marland Kitchenerpes simplex esophagitis 03/11/2015    Also candidal esophagitis  . HCAP (healthcare-associated pneumonia) 03/03/2015  . Mucositis due to radiation therapy 03/03/2015   Past Surgical History:  Past Surgical History  Procedure Laterality Date  . Abdominal hysterectomy  1996  . Cholecystectomy  2000  . Shoulder surgery Right 1998  . Eye surgery  2001  . Foot surgery Left 2005  . Craniotomy  09/15/2011    Procedure: CRANIOTOMY TUMOR EXCISION;  Surgeon: RoHosie SpangleMD;  [facility];  Service: Neurosurgery;  Laterality: N/A;   Craniotomy resection of tumor with stealth  . Cataract extraction  2011    With lens implant  . Total thyroidectomy  10-29-2013    [facility]. Lymph gland excision    . Video bronchoscopy with endobronchial ultrasound N/A 04/11/2014    Procedure: VIDEO BRONCHOSCOPY WITH ENDOBRONCHIAL ULTRASOUND;  Surgeon: StMelrose NakayamaMD;  [facility] Service: Thoracic;  Laterality: N/A;  . Esophagogastroduodenoscopy (egd) with propofol N/A 03/07/2015    Procedure: ESOPHAGOGASTRODUODENOSCOPY (EGD) WITH PROPOFOL;  Surgeon: RoDaneil DolinMD;  Location[facility];  Service: Endoscopy;  Laterality: N/A;  . Biopsy  03/07/2015    Procedure: BIOPSY;  Surgeon: RoDaneil DolinMD;  [facility];  Service: Endoscopy;;   HPI:  [age].o. female with a past medical history of essential hypertension, anxiety, depression, COPD, GERD, posterior arthritis, fibromyalgia, DVT/PE, metastatic adenocarcinoma to brain, thyroid cancer, recurrent herpes zoster who was discharged 2 days ago from the hospital for healthcare associated pneumonia and is brought to the emergency department after her daughter reports increased weakness since yesterday. Followed by SLP during recent admission.  MBS 1/24 revealed no aspiration, but delayed oral transit, lingual pumping, and vallecular residuals.  Coughing was observed during study, but no barium observed in larynx/trachea.  Regular diet with thin liquids was recommended, and pt was D/Cd home on this diet.  Assessment / Plan / Recommendation Clinical Impression  Pt appears to have deterioration in overall swallow function since recent D/C.  Her husband reports increased coughing when consuming thin liquids. (Coughing per MBS was not associated with aspiration, however frequency of cough is much greater today.) She presents with symptoms of dysphagia that are similar to her recent bedside swallow eval results.  Coughing occurred consistently  after consumption of thin and nectars; slowed mastication, oral holding of POs persist.  Pt with delayed response time, decreased initiation/spontaneity, confusion, and fatigue.  For today, recommend initiating a  dysphagia 2 diet with honey-thick liquids from a spoon.  SLP will follow to determine PO toleration, education, value of repeat instrumental study.      Aspiration Risk  Mild aspiration risk    Diet Recommendation     Medication Administration: Crushed with puree    Other  Recommendations Oral Care Recommendations: Oral care BID   Follow up Recommendations   (tba)    Frequency and Duration min 2x/week  1 week       Prognosis Prognosis for Safe Diet Advancement: Guarded      Swallow Study   General HPI: 60 y.o. female with a past medical history of essential hypertension, anxiety, depression, COPD, GERD, posterior arthritis, fibromyalgia, DVT/PE, metastatic adenocarcinoma to brain, thyroid cancer, recurrent herpes zoster who was discharged 2 days ago from the hospital for healthcare associated pneumonia and is brought to the emergency department after her daughter reports increased weakness since yesterday. Followed by SLP during recent admission.  MBS 1/24 revealed no aspiration, but delayed oral transit, lingual pumping, and vallecular residuals.  Coughing was observed during study, but no barium observed in larynx/trachea.  Regular diet with thin liquids was recommended, and pt was D/Cd home on this diet.  Type of Study: Bedside Swallow Evaluation Previous Swallow Assessment: January 2017; December 2016 with rec for D3/thin; clear throat with thins- esophagram silent penetration of liquids, pt is s/p EGD Dec 2016 w/ proximal dilatation without significant improvement  Diet Prior to this Study: Regular;Thin liquids Temperature Spikes Noted: No Respiratory Status: Nasal cannula History of Recent Intubation: No Behavior/Cognition: Confused;Lethargic/Drowsy Oral Cavity  Assessment: Within Functional Limits Oral Care Completed by SLP: No Oral Cavity - Dentition: Adequate natural dentition Vision: Functional for self-feeding Self-Feeding Abilities: Needs assist Patient Positioning: Upright in bed Baseline Vocal Quality: Breathy;Hoarse;Low vocal intensity Volitional Cough: Weak;Congested Volitional Swallow: Able to elicit    Oral/Motor/Sensory Function Overall Oral Motor/Sensory Function: Generalized oral weakness   Ice Chips Ice chips: Not tested   Thin Liquid Thin Liquid: Impaired Presentation: Cup Oral Phase Functional Implications: Oral holding Pharyngeal  Phase Impairments: Suspected delayed Swallow;Cough - Immediate    Nectar Thick Nectar Thick Liquid: Impaired Presentation: Cup Oral phase functional implications: Oral holding Pharyngeal Phase Impairments: Cough - Immediate   Honey Thick Honey Thick Liquid: Impaired Presentation: Spoon Oral Phase Functional Implications: Prolonged oral transit Pharyngeal Phase Impairments: Throat Clearing - Delayed (1/5 trials)   Puree Puree: Impaired Presentation: Spoon Oral Phase Impairments: Reduced lingual movement/coordination Oral Phase Functional Implications: Prolonged oral transit Pharyngeal Phase Impairments: Suspected delayed Swallow   Solid   GO   Solid: Not tested       Aadvik Roker L. Tivis Ringer, MA CCC/SLP Pager [phone]  Juan Quam Laurice 05/05/2015,1:27 PM

## 2015-05-05 NOTE — Progress Notes (Signed)
PROGRESS NOTE  Haley Lowe BTD:[id number] DOB: 06/06/1955 DOA: 05/04/2015 PCP: [name], MD  HPI: Haley Lowe is a 60 y.o. female with a past medical history of essential hypertension, anxiety, depression, COPD, GERD, posterior arthritis, fibromyalgia, DVT/PE, metastatic thyroid cancer who was discharged 2 days ago from the hospital for healthcare associated pneumonia and is brought to the emergency department after her daughter reports increased weakness since yesterday.  Subjective / 24 H Interval events - no specific complaints this morning, denies shortness of breath, denies chest pain, palpitations - feels "sleepy" and very weak - no fevers or chills at home  Assessment/Plan: Principal Problem:   Near syncope Active Problems:   Metastatic adenocarcinoma to brain, thyroid cancer   COPD (chronic obstructive pulmonary disease) (HCC)   Controlled diabetes mellitus type II without complication (HCC)   HCAP (healthcare-associated pneumonia)   Weakness   Weakness / FTT in the setting of metastatic thyroid cancer - patient with known mets to the brain last October, s/p XRT finished in Nov 2016. Supposed to get MRI soon for follow up - I am concerned with her worsening balance and falls at home, intermittent confusion reported by her husband that she might have recurrent disease - she is currently unsafe for home environment - PT consult - repeat MRI of the brain  HCAP (healthcare-associated pneumonia) - improving, recently hospitalized for same, suspect underlying aspiration - she is afebrile, has no leukocytosis - continue levofloxacin 750 mg by mouth daily.  COPD (chronic obstructive pulmonary disease) (HCC) - no wheezing, does not appear to have exacerbation - Continue albuterol nebulizer therapy as needed.  Controlled diabetes mellitus type II without complication (HCC) - SSI  Recurrent herpes zoster - continue valacyclovir to 1000 mg by mouth twice a  day    Diet: Diet Carb Modified Fluid consistency:: Thin; Room service appropriate?: Yes Fluids: NS DVT Prophylaxis: Lovenox  Code Status: Full Code Family Communication: d/w husband bedside  Disposition Plan: remain inpatient. SNF vs home  Barriers to discharge: high risk of falls and injury  Consultants:  None   Procedures:  None    Antibiotics  Anti-infectives    Start     Dose/Rate Route Frequency Ordered Stop   05/05/15 1000  levofloxacin (LEVAQUIN) tablet 750 mg     750 mg Oral Daily 05/04/15 2034     05/04/15 2045  valACYclovir (VALTREX) tablet 1,000 mg     1,000 mg Oral 2 times daily 05/04/15 2034         Studies  Dg Chest 2 View  05/04/2015  CLINICAL DATA:  Tripped over object and fell at home today. Right-sided chest pain. Shortness of breath. COPD. EXAM: CHEST  2 VIEW COMPARISON:  04/27/2015 FINDINGS: Heart size remains within normal limits. Bibasilar pulmonary infiltrates, left side greater than right, show no significant change. No evidence of pneumothorax or pleural effusion. IMPRESSION: Bibasilar pulmonary infiltrates, without significant interval change. Electronically Signed   By: Earle Gell M.D.   On: 05/04/2015 16:34   Dg Pelvis 1-2 Views  05/04/2015  CLINICAL DATA:  Fall.  Pelvic pain.  Initial encounter. EXAM: PELVIS - 1-2 VIEW COMPARISON:  None. FINDINGS: Residual contrast material is seen within the rectosigmoid colon. This partially obscures visualization of the pelvis, however no definite pelvic fracture or other focal bone lesion identified. No evidence of pelvic joint diastasis. IMPRESSION: No pelvic fracture identified. Visualization limited by residual contrast material in rectosigmoid colon. Electronically Signed   By: Sharrie Rothman.D.  On: 05/04/2015 16:41   Ct Head Wo Contrast  05/04/2015  CLINICAL DATA:  Altered mental status and increase weakness over past 2 days. Metastatic thyroid cancer to brain. Previous craniotomy and radiation  therapy. EXAM: CT HEAD WITHOUT CONTRAST TECHNIQUE: Contiguous axial images were obtained from the base of the skull through the vertex without intravenous contrast. COMPARISON:  04/18/2015 FINDINGS: There is no evidence of intracranial hemorrhage, brain edema, or other signs of acute infarction. No abnormal extraaxial fluid collections are identified. A 7 mm low-attenuation lesion in the left cerebellum remains stable, consistent with treated metastasis. No other intra-axial mass identified. No evidence of mass effect. Mild left parietal encephalomalacia remains stable with adjacent left craniotomy defect. Mild chronic small vessel disease again noted. IMPRESSION: Stable exam.  No acute intracranial findings. Stable 7 mm low-attenuation lesion in left cerebellum, consistent with treated metastasis. Electronically Signed   By: Earle Gell M.D.   On: 05/04/2015 16:55    Objective  Filed Vitals:   05/04/15 1745 05/04/15 1830 05/04/15 2015 05/05/15 0700  BP: 90/55 101/64 110/75 126/78  Pulse: 76 99 100 107  Temp:   97.9 F (36.6 C) 98 F (36.7 C)  TempSrc:   Oral Oral  Resp: '23 20 22 20  '$ Height:   '5\' 4"'$  (1.626 m)   Weight:   64.501 kg (142 lb 3.2 oz)   SpO2: 93% 97% 96% 94%    Intake/Output Summary (Last 24 hours) at 05/05/15 0718 Last data filed at 05/04/15 1724  Gross per 24 hour  Intake      0 ml  Output    300 ml  Net   -300 ml   Filed Weights   05/04/15 2015  Weight: 64.501 kg (142 lb 3.2 oz)    Exam:  GENERAL: NAD, appears pale and weak  HEENT: no scleral icterus, PERRL  LUNGS: CTA biL, no wheezing  HEART: RRR without MRG, good peripheral pulses, no edema  ABDOMEN: soft, non tender  EXTREMITIES: no clubbing / cyanosis  NEUROLOGIC: non focal  PSYCHIATRIC: normal mood and affect  Data Reviewed: Basic Metabolic Panel:  Recent Labs Lab 04/30/15 0750 05/01/15 0920 05/02/15 0550 05/04/15 1604  NA  --  143 141 139  K  --  2.7* 3.6 3.9  CL  --  110 108 104  CO2   --  '24 26 28  '$ GLUCOSE  --  126* 93 80  BUN  --  <5* <5* <5*  CREATININE 0.40* 0.36* 0.32* 0.53  CALCIUM  --  7.9* 8.0* 8.2*  MG  --  1.4* 1.9  --    Liver Function Tests:  Recent Labs Lab 05/04/15 1604  AST 30  ALT 15  ALKPHOS 79  BILITOT 0.6  PROT 5.0*  ALBUMIN 2.4*   CBC:  Recent Labs Lab 05/02/15 0550 05/04/15 1604 05/05/15 0516  WBC 2.8* 4.9 3.9*  NEUTROABS  --  3.6 2.8  HGB 11.2* 10.9* 10.5*  HCT 37.5 35.9* 34.5*  MCV 98.9 97.8 100.0  PLT 288 274 249   BNP (last 3 results)  Recent Labs  03/15/15 2100 04/18/15 2031  BNP 81.0 42.0   CBG:  Recent Labs Lab 05/05/15 0702  GLUCAP 77    Recent Results (from the past 240 hour(s))  Culture, blood (routine x 2) Call MD if unable to obtain prior to antibiotics being given     Status: None   Collection Time: 04/27/15 11:33 PM  Result Value Ref Range Status   Specimen Description  BLOOD LEFT HAND  Final   Special Requests BOTTLES DRAWN AEROBIC ONLY 5CC  Final   Culture   Final    NO GROWTH 5 DAYS Performed at [facility]    Report Status 05/03/2015 FINAL  Final  Culture, sputum-assessment     Status: None   Collection Time: 04/28/15  7:45 PM  Result Value Ref Range Status   Specimen Description SPUTUM  Final   Special Requests Immunocompromised  Final   Sputum evaluation   Final    MICROSCOPIC FINDINGS SUGGEST THAT THIS SPECIMEN IS NOT REPRESENTATIVE OF LOWER RESPIRATORY SECRETIONS. PLEASE RECOLLECT. [facility] RN [phone] [place],N    Report Status 04/28/2015 FINAL  Final     Scheduled Meds: . enoxaparin (LOVENOX) injection  40 mg Subcutaneous QHS  . levofloxacin  750 mg Oral Daily  . levothyroxine  175 mcg Oral QAC breakfast  . magnesium oxide  200 mg Oral Daily  . pantoprazole  40 mg Oral BID  . potassium chloride SA  40 mEq Oral BID  . sodium chloride flush  3 mL Intravenous Q12H  . SONAFINE  1 application Topical Daily  . valACYclovir  1,000 mg Oral BID   Continuous  Infusions: . sodium chloride 100 mL/hr at 05/04/15 2136    Marzetta Board, MD [facility] Pager [phone]. If 7 PM - 7 AM, please contact night-coverage at [email or web address], password [facility] 05/05/2015, 7:18 AM

## 2015-05-05 NOTE — Progress Notes (Signed)
Initial Nutrition Assessment  DOCUMENTATION CODES:   Not applicable  INTERVENTION:  - Will d/c Boost Plus - Will order Ensure Enlive po BID, each supplement provides 350 kcal and 20 grams of protein - Continue Resource ThickenUp and add to thin liquids (such as Ensure) to reach honey-thick consistency - RD will continue to monitor for needs  NUTRITION DIAGNOSIS:   Increased nutrient needs related to catabolic illness, cancer and cancer related treatments as evidenced by estimated needs.  GOAL:   Patient will meet greater than or equal to 90% of their needs  MONITOR:   PO intake, Supplement acceptance, Weight trends, Labs, I & O's  REASON FOR ASSESSMENT:   Malnutrition Screening Tool  ASSESSMENT:   60 y.o. female with a past medical history of essential hypertension, anxiety, depression, COPD, GERD, posterior arthritis, fibromyalgia, DVT/PE, metastatic thyroid cancer who was discharged 2 days ago from the hospital for healthcare associated pneumonia and is brought to the emergency department after her daughter reports increased weakness since yesterday.  Pt seen for MST. BMI indicates normal weight/borderline overweight. Pt is well known to this RD from hospitalization from which she was d/c'ed on the 26th or 27th. No intakes documented; spoke with RN who reports that SLP saw pt earlier today and that pt has been given applesauce with medications but that she has not seen pt eat anything else today.   PT worked with pt right before RD visit. No family or visitors present at this time. Pt was resting when RD entered the room with hand over her face. Did not open eyes to name call x2. Felt that it was not necessary to fully awaken pt at this time.  Unable to perform physical assessment. Will switch from Boost to Ensure due to protein content difference. Will monitor for need to further adjust supplements based on abilities and needs with thickened liquids. Per chart review, pt has  gained 3 lbs in the past 1 week; will continue to monitor weight trends as this may be fluid related.   Not meeting needs today. Medications reviewed. Labs reviewed; BUN <5 mg/dL, Ca: 8.2 mg/dL.   Diet Order:  DIET DYS 2 Room service appropriate?: Yes; Fluid consistency:: Honey Thick  Skin:  Reviewed, no issues  Last BM:  1/30  Height:   Ht Readings from Last 1 Encounters:  05/04/15 '5\' 4"'$  (1.626 m)    Weight:   Wt Readings from Last 1 Encounters:  05/04/15 142 lb 3.2 oz (64.501 kg)    Ideal Body Weight:  54.54 kg (kg)  BMI:  Body mass index is 24.4 kg/(m^2).  Estimated Nutritional Needs:   Kcal:  1920-2175 (30-34 kcal/kg)  Protein:  77-90 grams (1.2-1.4 grams/kg)  Fluid:  >/= 2.1 L/day  EDUCATION NEEDS:   No education needs identified at this time     Jarome Matin, RD, LDN Inpatient Clinical Dietitian Pager # [phone] After hours/weekend pager # [phone]

## 2015-05-05 NOTE — ED Provider Notes (Signed)
CSN: [id number]     Arrival date & time 05/04/15  1351 History   First MD Initiated Contact with Patient 05/04/15 1512     Chief Complaint  Patient presents with  . Weakness     (Consider location/radiation/quality/duration/timing/severity/associated sxs/prior Treatment) HPI Patient presents to the emergency department with weakness and inability to ambulate.  The patient was recently discharged from the hospital following a stay for healthcare associated pneumonia.  Patient was doing well after day one out of the hospital, but started to climb yesterday and got worse today.  Patient was unable to ambulate.  Patient has not had any vomiting, diarrhea, headache, blurred vision, chest pain, shortness of breath, abdominal pain, incontinence, dysuria, bloody stool, hematemesis, or syncope.  Patient states that she does feel very lightheaded when she stands like she might pass out, but does not actually pass out. Past Medical History  Diagnosis Date  . Essential hypertension   . Anxiety   . Depression   . COPD (chronic obstructive pulmonary disease) ([place])   . GERD (gastroesophageal reflux disease)   . Headache(784.0)   . Arthritis   . Hx of adenomatous colonic polyps   . Emphysema   . Cataract     1 lens implaqnt right eye,intact on left eye  . Goiter   . Fibromyalgia   . Pulmonary emboli ([place]) 11/04/11    Right upper lobe and r lower lobe PE  . Lung nodule seen on imaging study 11/04/11 CT    27m LLL  . DVT (deep venous thrombosis) ([facility] 12/09/2011  . [place]monary embolism ([facility] 12/09/2011  . Hyperlipidemia   . History of radiation therapy 10/06/11    SRS 15Gy 115f brain  . UTI (lower urinary tract infection)   . Borderline diabetes mellitus   . Metastatic adenocarcinoma to brain ([facility]/[place]13    Left frontoparietal region  . Thyroid cancer ([facility]  . S/P radiation therapy 05/31/14 SRS    Brain  . PONV (postoperative nausea and vomiting)   . Controlled diabetes mellitus type II without  complication ([id number]  Previously diagnosed with borderline diabetes.  . Marland Kitchenerpes simplex esophagitis 03/11/2015    Also candidal esophagitis  . HCAP (healthcare-associated pneumonia) 03/03/2015  . Mucositis due to radiation therapy 03/03/2015   Past Surgical History  Procedure Laterality Date  . Abdominal hysterectomy  1996  . Cholecystectomy  2000  . Shoulder surgery Right 1998  . Eye surgery  2001  . Foot surgery Left 2005  . Craniotomy  09/15/2011    Procedure: CRANIOTOMY TUMOR EXCISION;  Surgeon: RoHosie SpangleMD;  [facility];  Service: Neurosurgery;  Laterality: N/A;  Craniotomy resection of tumor with stealth  . Cataract extraction  2011    With lens implant  . [facility]29-2013    [facility]    . Video bronchoscopy with endobronchial ultrasound N/A 04/11/2014    Procedure: VIDEO BRONCHOSCOPY WITH ENDOBRONCHIAL ULTRASOUND;  Surgeon: StMelrose NakayamaMD;  [facility] Service: Thoracic;  Laterality: N/A;  . Esophagogastroduodenoscopy (egd) with propofol N/A 03/07/2015    Procedure: ESOPHAGOGASTRODUODENOSCOPY (EGD) WITH PROPOFOL;  Surgeon: RoDaneil DolinMD;  Location[facility];  Service: Endoscopy;  Laterality: N/A;  . Biopsy  03/07/2015    Procedure: BIOPSY;  Surgeon: RoDaneil DolinMD;  Location[facility];  Service: Endoscopy;;   Family History  Problem Relation Age of Onset  .  Asthma Mother   . Kidney failure Father   . Diabetes Sister   . Heart attack Sister   . Colon cancer Brother   . COPD Sister   . Aneurysm Paternal Grandmother     Brain  . Parkinsonism Maternal Uncle   . COPD Brother    Social History  Substance Use Topics  . Smoking status: Former Smoker -- 1.00 packs/day for 40 years    Types: Cigarettes    Quit date: 04/06/2011  . Smokeless tobacco: Never Used  . Alcohol Use: No   OB History    No data available     Review of Systems All other  systems negative except as documented in the HPI. All pertinent positives and negatives as reviewed in the HPI.   Allergies  Penicillins; Other; Codeine; Potassium-containing compounds; and Tape  Home Medications   Prior to Admission medications   Medication Sig Start Date End Date Taking? Authorizing Provider  albuterol (PROVENTIL) (2.5 MG/3ML) 0.083% nebulizer solution Take 3 mLs (2.5 mg total) by nebulization every 4 (four) hours as needed for wheezing or shortness of breath. 04/25/15  Yes Orvan Falconer, MD  ALPRAZolam Duanne Moron) 1 MG tablet Take 1 mg by mouth 3 (three) times daily as needed for anxiety.   Yes Historical Provider, MD  amitriptyline (ELAVIL) 50 MG tablet Take 50 mg by mouth at bedtime.   Yes Historical Provider, MD  benzonatate (TESSALON) 100 MG capsule Take 1 capsule (100 mg total) by mouth 3 (three) times daily as needed for cough. 05/02/15  Yes Eugenie Filler, MD  clotrimazole (GYNE-LOTRIMIN) 1 % vaginal cream Place 1 Applicatorful vaginally at bedtime. Patient taking differently: Place 1 Applicatorful vaginally at bedtime as needed (for vaginal discomfort and itching).  05/02/15  Yes Eugenie Filler, MD  lactose free nutrition (BOOST) LIQD Take 237 mLs by mouth 3 (three) times daily as needed (for poor appetite).   Yes Historical Provider, MD  levofloxacin (LEVAQUIN) 750 MG tablet Take 1 tablet (750 mg total) by mouth daily. Take for 3 days then stop. 05/02/15  Yes Eugenie Filler, MD  levothyroxine (SYNTHROID, LEVOTHROID) 175 MCG tablet Take 175 mcg by mouth daily before breakfast.   Yes Historical Provider, MD  magic mouthwash w/lidocaine SOLN Take 5 mLs by mouth 4 (four) times daily as needed for mouth pain. 03/13/15  Yes Velvet Bathe, MD  Magnesium 250 MG TABS Take 1 tablet by mouth daily.   Yes Historical Provider, MD  nystatin (MYCOSTATIN) 100000 UNIT/ML suspension Use as directed 5 mLs (500,000 Units total) in the mouth or throat 4 (four) times daily. Take for 1 week then  stop. Patient taking differently: Use as directed 5 mLs in the mouth or throat 4 (four) times daily as needed (for thrush). Take for 1 week then stop. 05/02/15  Yes Eugenie Filler, MD  ondansetron (ZOFRAN ODT) 8 MG disintegrating tablet Take 1 tablet (8 mg total) by mouth every 8 (eight) hours as needed for nausea or vomiting. 04/05/14  Yes Pattricia Boss, MD  Oxycodone HCl 20 MG TABS Take 1 tablet by mouth every 4 (four) hours as needed (pain).   Yes Historical Provider, MD  pantoprazole (PROTONIX) 40 MG tablet Take 1 tablet (40 mg total) by mouth 2 (two) times daily. 03/13/15  Yes Velvet Bathe, MD  polyethylene glycol (MIRALAX / GLYCOLAX) packet Take 17 g by mouth daily as needed for moderate constipation. Reported on 04/27/2015   Yes Historical Provider, MD  potassium chloride SA (K-DUR,KLOR-CON)  20 MEQ tablet Take 40 mEq by mouth 2 (two) times daily. Dissolve in water and take either in applesauce or drink the water.   Yes Historical Provider, MD  sucralfate (CARAFATE) 1 G tablet Take 1 tablet (1 g total) by mouth 4 (four) times daily -  with meals and at bedtime. Patient taking differently: Take 1 g by mouth 4 (four) times daily as needed (stomach coating before meals).  03/13/15  Yes Velvet Bathe, MD  valACYclovir (VALTREX) 1000 MG tablet Take 1 tablet (1,000 mg total) by mouth daily. 04/25/15  Yes Orvan Falconer, MD  Wound Dressings (SONAFINE) Apply 1 application topically daily.   Yes Historical Provider, MD  amitriptyline (ELAVIL) 25 MG tablet Take 1 tablet (25 mg total) by mouth at bedtime. Patient not taking: Reported on 05/04/2015 04/25/15   Orvan Falconer, MD  bisacodyl (DULCOLAX) 10 MG suppository Place 1 suppository (10 mg total) rectally daily as needed for moderate constipation. Patient not taking: Reported on 04/27/2015 04/25/15   Orvan Falconer, MD  feeding supplement, ENSURE ENLIVE, (ENSURE ENLIVE) LIQD Take 237 mLs by mouth 2 (two) times daily between meals. Patient not taking: Reported on 05/04/2015 04/25/15    Orvan Falconer, MD  potassium chloride 20 MEQ/15ML (10%) SOLN Take 30 mLs (40 mEq total) by mouth daily. Patient not taking: Reported on 05/04/2015 05/02/15   Eugenie Filler, MD   BP 110/75 mmHg  Pulse 100  Temp(Src) 97.9 F (36.6 C) (Oral)  Resp 22  Ht '5\' 4"'$  (1.626 m)  Wt 64.501 kg  BMI 24.40 kg/m2  SpO2 96% Physical Exam  Constitutional: She is oriented to person, place, and time. She appears well-developed and well-nourished. No distress.  HENT:  Head: Normocephalic and atraumatic.  Mouth/Throat: Oropharynx is clear and moist.  Eyes: Pupils are equal, round, and reactive to light.  Neck: Normal range of motion. Neck supple.  Cardiovascular: Normal rate, regular rhythm and normal heart sounds.  Exam reveals no gallop and no friction rub.   No murmur heard. Pulmonary/Chest: Effort normal and breath sounds normal. No respiratory distress. She has no wheezes.  Abdominal: Soft. Bowel sounds are normal. She exhibits no distension. There is no tenderness.  Neurological: She is alert and oriented to person, place, and time. She exhibits normal muscle tone. Coordination normal.  Skin: Skin is warm and dry. No rash noted. No erythema.  Psychiatric: She has a normal mood and affect. Her behavior is normal.  Nursing note and vitals reviewed.   ED Course  Procedures (including critical care time) Labs Review Labs Reviewed  CBC WITH DIFFERENTIAL/PLATELET - Abnormal; Notable for the following:    RBC 3.67 (*)    Hemoglobin 10.9 (*)    HCT 35.9 (*)    RDW 18.7 (*)    Lymphs Abs 0.5 (*)    All other components within normal limits  COMPREHENSIVE METABOLIC PANEL - Abnormal; Notable for the following:    BUN <5 (*)    Calcium 8.2 (*)    Total Protein 5.0 (*)    Albumin 2.4 (*)    All other components within normal limits  URINALYSIS, ROUTINE W REFLEX MICROSCOPIC (NOT AT [facility])  CBC WITH DIFFERENTIAL/PLATELET  I-STAT CG4 LACTIC ACID, ED    Imaging Review Dg Chest 2 View  05/04/2015   CLINICAL DATA:  Tripped over object and fell at home today. Right-sided chest pain. Shortness of breath. COPD. EXAM: CHEST  2 VIEW COMPARISON:  04/27/2015 FINDINGS: Heart size remains within normal limits. Bibasilar pulmonary  infiltrates, left side greater than right, show no significant change. No evidence of pneumothorax or pleural effusion. IMPRESSION: Bibasilar pulmonary infiltrates, without significant interval change. Electronically Signed   By: Earle Gell M.D.   On: 05/04/2015 16:34   Dg Pelvis 1-2 Views  05/04/2015  CLINICAL DATA:  Fall.  Pelvic pain.  Initial encounter. EXAM: PELVIS - 1-2 VIEW COMPARISON:  None. FINDINGS: Residual contrast material is seen within the rectosigmoid colon. This partially obscures visualization of the pelvis, however no definite pelvic fracture or other focal bone lesion identified. No evidence of pelvic joint diastasis. IMPRESSION: No pelvic fracture identified. Visualization limited by residual contrast material in rectosigmoid colon. Electronically Signed   By: Earle Gell M.D.   On: 05/04/2015 16:41   Ct Head Wo Contrast  05/04/2015  CLINICAL DATA:  Altered mental status and increase weakness over past 2 days. Metastatic thyroid cancer to brain. Previous craniotomy and radiation therapy. EXAM: CT HEAD WITHOUT CONTRAST TECHNIQUE: Contiguous axial images were obtained from the base of the skull through the vertex without intravenous contrast. COMPARISON:  04/18/2015 FINDINGS: There is no evidence of intracranial hemorrhage, brain edema, or other signs of acute infarction. No abnormal extraaxial fluid collections are identified. A 7 mm low-attenuation lesion in the left cerebellum remains stable, consistent with treated metastasis. No other intra-axial mass identified. No evidence of mass effect. Mild left parietal encephalomalacia remains stable with adjacent left craniotomy defect. Mild chronic small vessel disease again noted. IMPRESSION: Stable exam.  No acute  intracranial findings. Stable 7 mm low-attenuation lesion in left cerebellum, consistent with treated metastasis. Electronically Signed   By: Earle Gell M.D.   On: 05/04/2015 16:55   I have personally reviewed and evaluated these images and lab results as part of my medical decision-making.   EKG Interpretation None      Patient be admitted to the hospital for weakness and near syncopal events with orthostasis.   Dalia Heading, PA-C 05/05/15 0113  Wandra Arthurs, MD 05/06/15 1057

## 2015-05-06 ENCOUNTER — Observation Stay (HOSPITAL_COMMUNITY): Payer: Medicare Other

## 2015-05-06 DIAGNOSIS — J690 Pneumonitis due to inhalation of food and vomit: Principal | ICD-10-CM

## 2015-05-06 DIAGNOSIS — R1310 Dysphagia, unspecified: Secondary | ICD-10-CM

## 2015-05-06 DIAGNOSIS — J189 Pneumonia, unspecified organism: Secondary | ICD-10-CM

## 2015-05-06 DIAGNOSIS — R55 Syncope and collapse: Secondary | ICD-10-CM | POA: Diagnosis not present

## 2015-05-06 DIAGNOSIS — R0600 Dyspnea, unspecified: Secondary | ICD-10-CM

## 2015-05-06 LAB — CBC
HEMATOCRIT: 31.6 % — AB (ref 36.0–46.0)
Hemoglobin: 9.8 g/dL — ABNORMAL LOW (ref 12.0–15.0)
MCH: 30.2 pg (ref 26.0–34.0)
MCHC: 31 g/dL (ref 30.0–36.0)
MCV: 97.2 fL (ref 78.0–100.0)
PLATELETS: 197 10*3/uL (ref 150–400)
RBC: 3.25 MIL/uL — ABNORMAL LOW (ref 3.87–5.11)
RDW: 18.3 % — AB (ref 11.5–15.5)
WBC: 2.7 10*3/uL — AB (ref 4.0–10.5)

## 2015-05-06 LAB — BASIC METABOLIC PANEL
ANION GAP: 6 (ref 5–15)
BUN: <5 mg/dL — ABNORMAL LOW (ref 6–20)
CALCIUM: 8.7 mg/dL — AB (ref 8.9–10.3)
CO2: 26 mmol/L (ref 22–32)
Chloride: 113 mmol/L — ABNORMAL HIGH (ref 101–111)
Creatinine, Ser: 0.35 mg/dL — ABNORMAL LOW (ref 0.44–1.00)
Glucose, Bld: 86 mg/dL (ref 65–99)
Potassium: 4.3 mmol/L (ref 3.5–5.1)
Sodium: 145 mmol/L (ref 135–145)

## 2015-05-06 LAB — GLUCOSE, CAPILLARY: GLUCOSE-CAPILLARY: 62 mg/dL — AB (ref 65–99)

## 2015-05-06 MED ORDER — AMITRIPTYLINE HCL 25 MG PO TABS
25.0000 mg | ORAL_TABLET | Freq: Every day | ORAL | Status: DC
  Administered 2015-05-06 – 2015-05-09 (×4): 25 mg via ORAL
  Filled 2015-05-06 (×5): qty 1

## 2015-05-06 MED ORDER — DEXTROSE 5 % IV SOLN
1.0000 g | Freq: Three times a day (TID) | INTRAVENOUS | Status: DC
  Administered 2015-05-06 – 2015-05-08 (×6): 1 g via INTRAVENOUS
  Filled 2015-05-06 (×7): qty 1

## 2015-05-06 NOTE — Consult Note (Signed)
Name: Haley Lowe MRN: [id number] DOB: 11-01-1955    ADMISSION DATE:  05/04/2015 CONSULTATION DATE:  1/31  REFERRING MD :  Cruzita Lederer   CHIEF COMPLAINT:  Recurrent PNA   BRIEF PATIENT DESCRIPTION:  60 year old female w/ metastatic thyroid cancer w/ mets to brain. She is s/p palliative XRT to brain and neck in Nov 2016. Has had several hospital admits since Dec 2016 for recurrent PNA. PCCM asked to eval re: recurrent PNA and role for possible bronch   SIGNIFICANT EVENTS    STUDIES:     HISTORY OF PRESENT ILLNESS:    60 y.o. female with a past medical history of essential hypertension, anxiety, depression, COPD, GERD, posterior arthritis, fibromyalgia, DVT/PE, metastatic thyroid cancer she is s/p whole brain and neck palliative XRT (finished Nov 16) (now on palliative Rx w/ Lenvatinib) who was discharged on 1/20 for what was identified as  healthcare associated pneumonia. Incidentally this was her 62th such admit for recurrent pneumonia. She was d/c'd to home and then presented back to the ED on 1/29 w/ cc: progressive weakness, decreased strength, poor PO and fluid intake all c/b a fall. She was admitted w/ working dx of dehydration. CXR was obtained and showed bibasilar airspace disease. PCCM was asked to assess re: her abnormal CT and CXR finding and make further recs. Specifically asking of bronchoscopy would be of benefit.   On questioning pt's first pulmonary symptoms occurred around the 1st/2nd week of Dec. At which time she was diagnosed w/ PNA. She had subsequently been dx'd and admitted 3 times since. She has had evidence of dysphagia on prior swallow eval as well as at bedside eval this time. Her husband endorses that this has indeed been a clear issue lately. Her primary complaint is weakness. Her husband notes intermittent confusion and notes that these things get worse typically after leaving the hospital after 2-3d. She has persistent cough and currently is coughing up blue tinged  saliva from prior mouth care. She has intermittent fever. She is not short of breath at current but activity is limited by fatigue. She denies CP, wheeze, LE swelling, palps. N or vomiting.  PAST MEDICAL HISTORY :   has a past medical history of Essential hypertension; Anxiety; Depression; COPD (chronic obstructive pulmonary disease) ([place]); GERD (gastroesophageal reflux disease); Headache(784.0); Arthritis; adenomatous colonic polyps; Emphysema; Cataract; Goiter; Fibromyalgia; Pulmonary emboli ([place]) (11/04/11); Lung nodule seen on imaging study (11/04/11 CT); DVT (deep venous thrombosis) ([place]) (12/09/2011); Pulmonary embolism ([place]) (12/09/2011); Hyperlipidemia; History of radiation therapy (10/06/11); UTI (lower urinary tract infection); Borderline diabetes mellitus; Metastatic adenocarcinoma to brain ([place]) (09/15/11); Thyroid cancer ([place]) (2015); S/P radiation therapy (05/31/14 [facility]); PONV (postoperative nausea and vomiting); Controlled diabetes mellitus type II without complication ([place]) (32/95/1884); 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  has past surgical history that includes Abdominal hysterectomy (1996); Cholecystectomy (2000); Shoulder surgery (Right, 1998); Eye surgery (2001); Foot surgery (Left, 2005); Craniotomy (09/15/2011); Cataract extraction (2011); Total thyroidectomy (10-29-2013); Lymph gland excision; Video bronchoscopy with endobronchial ultrasound (N/A, 04/11/2014); Esophagogastroduodenoscopy (egd) with propofol (N/A, 03/07/2015); and biopsy (03/07/2015). Prior to Admission medications   Medication Sig Start Date End Date Taking? Authorizing Provider  albuterol (PROVENTIL) (2.5 MG/3ML) 0.083% nebulizer solution Take 3 mLs (2.5 mg total) by nebulization every 4 (four) hours as needed for wheezing or shortness of breath. 04/25/15  Yes Orvan Falconer, MD  ALPRAZolam Duanne Moron) 1 MG tablet Take 1 mg by mouth 3  (three) times daily  as needed for anxiety.   Yes Historical Provider, MD  amitriptyline (ELAVIL) 50 MG tablet Take 50 mg by mouth at bedtime.   Yes Historical Provider, MD  benzonatate (TESSALON) 100 MG capsule Take 1 capsule (100 mg total) by mouth 3 (three) times daily as needed for cough. 05/02/15  Yes Eugenie Filler, MD  clotrimazole (GYNE-LOTRIMIN) 1 % vaginal cream Place 1 Applicatorful vaginally at bedtime. Patient taking differently: Place 1 Applicatorful vaginally at bedtime as needed (for vaginal discomfort and itching).  05/02/15  Yes Eugenie Filler, MD  lactose free nutrition (BOOST) LIQD Take 237 mLs by mouth 3 (three) times daily as needed (for poor appetite).   Yes Historical Provider, MD  levofloxacin (LEVAQUIN) 750 MG tablet Take 1 tablet (750 mg total) by mouth daily. Take for 3 days then stop. 05/02/15  Yes Eugenie Filler, MD  levothyroxine (SYNTHROID, LEVOTHROID) 175 MCG tablet Take 175 mcg by mouth daily before breakfast.   Yes Historical Provider, MD  magic mouthwash w/lidocaine SOLN Take 5 mLs by mouth 4 (four) times daily as needed for mouth pain. 03/13/15  Yes Velvet Bathe, MD  Magnesium 250 MG TABS Take 1 tablet by mouth daily.   Yes Historical Provider, MD  nystatin (MYCOSTATIN) 100000 UNIT/ML suspension Use as directed 5 mLs (500,000 Units total) in the mouth or throat 4 (four) times daily. Take for 1 week then stop. Patient taking differently: Use as directed 5 mLs in the mouth or throat 4 (four) times daily as needed (for thrush). Take for 1 week then stop. 05/02/15  Yes Eugenie Filler, MD  ondansetron (ZOFRAN ODT) 8 MG disintegrating tablet Take 1 tablet (8 mg total) by mouth every 8 (eight) hours as needed for nausea or vomiting. 04/05/14  Yes Pattricia Boss, MD  Oxycodone HCl 20 MG TABS Take 1 tablet by mouth every 4 (four) hours as needed (pain).   Yes Historical Provider, MD  pantoprazole (PROTONIX) 40 MG tablet Take 1 tablet (40 mg total) by mouth 2 (two) times  daily. 03/13/15  Yes Velvet Bathe, MD  polyethylene glycol (MIRALAX / GLYCOLAX) packet Take 17 g by mouth daily as needed for moderate constipation. Reported on 04/27/2015   Yes Historical Provider, MD  potassium chloride SA (K-DUR,KLOR-CON) 20 MEQ tablet Take 40 mEq by mouth 2 (two) times daily. Dissolve in water and take either in applesauce or drink the water.   Yes Historical Provider, MD  sucralfate (CARAFATE) 1 G tablet Take 1 tablet (1 g total) by mouth 4 (four) times daily -  with meals and at bedtime. Patient taking differently: Take 1 g by mouth 4 (four) times daily as needed (stomach coating before meals).  03/13/15  Yes Velvet Bathe, MD  valACYclovir (VALTREX) 1000 MG tablet Take 1 tablet (1,000 mg total) by mouth daily. 04/25/15  Yes Orvan Falconer, MD  Wound Dressings (SONAFINE) Apply 1 application topically daily.   Yes Historical Provider, MD  amitriptyline (ELAVIL) 25 MG tablet Take 1 tablet (25 mg total) by mouth at bedtime. Patient not taking: Reported on 05/04/2015 04/25/15   Orvan Falconer, MD  bisacodyl (DULCOLAX) 10 MG suppository Place 1 suppository (10 mg total) rectally daily as needed for moderate constipation. Patient not taking: Reported on 04/27/2015 04/25/15   Orvan Falconer, MD  feeding supplement, ENSURE ENLIVE, (ENSURE ENLIVE) LIQD Take 237 mLs by mouth 2 (two) times daily between meals. Patient not taking: Reported on 05/04/2015 04/25/15   Orvan Falconer, MD  potassium chloride 20 MEQ/15ML (10%)  SOLN Take 30 mLs (40 mEq total) by mouth daily. Patient not taking: Reported on 05/04/2015 05/02/15   Eugenie Filler, MD   Allergies  Allergen Reactions  . Penicillins Swelling    " my brain swelled, and mymouth" Has patient had a PCN reaction causing immediate rash, facial/tongue/throat swelling, SOB or lightheadedness with hypotension: Yes Has patient had a PCN reaction causing severe rash involving mucus membranes or skin necrosis: No Has patient had a PCN reaction that required hospitalization  No Has patient had a PCN reaction occurring within the last 10 years: No If all of the above answers are "NO", then may proceed with Cephalosporin use.   . Other Swelling    PT ALSO REACTS TO PAPER TAPE AND BAND-AIDS.  Marland Kitchen Codeine Itching    Mild takes benadryl  . Potassium-Containing Compounds Other (See Comments)    Potassium Liquid only- burns the mouth and throat, pt is able to take tablets dissolved.  . Tape Rash    FAMILY HISTORY:  family history includes Aneurysm in her paternal grandmother; Asthma in her mother; COPD in her brother and sister; Colon cancer in her brother; Diabetes in her sister; Heart attack in her sister; Kidney failure in her father; Parkinsonism in her maternal uncle. SOCIAL HISTORY:  reports that she quit smoking about 4 years ago. Her smoking use included Cigarettes. She has a 40 pack-year smoking history. She has never used smokeless tobacco. She reports that she does not drink alcohol or use illicit drugs.  REVIEW OF SYSTEMS:   Constitutional: Negative for fever, chills, weight loss, malaise/fatigue and diaphoresis.  HENT: Negative for hearing loss, ear pain, nosebleeds, congestion, sore throat, neck pain, tinnitus and ear discharge.   Eyes: Negative for blurred vision, double vision, photophobia, pain, discharge and redness.  Respiratory: cough worse w/eating, hemoptysis, sputum production, shortness of breath, wheezing and stridor.   Cardiovascular: Negative for chest pain, palpitations, orthopnea, claudication, leg swelling and PND.  Gastrointestinal: Negative for heartburn, nausea, vomiting, abdominal pain, diarrhea, constipation, blood in stool and melena. Poor PO intake Genitourinary: Negative for dysuria, urgency, frequency, hematuria and flank pain.  Musculoskeletal: Negative for myalgias, back pain, joint pain and falls.  Skin: Negative for itching and rash.  Neurological: Negative for dizziness, tingling, tremors, sensory change, speech change,  focal weakness, seizures, loss of consciousness, weakness and headaches.  Endo/Heme/Allergies: Negative for environmental allergies and polydipsia. Does not bruise/bleed easily.  SUBJECTIVE:  No distress VITAL SIGNS: Temp:  [97.6 F (36.4 C)-98.8 F (37.1 C)] 97.8 F (36.6 C) (01/31 1093) Pulse Rate:  [104-106] 106 (01/30 2111) Resp:  [18] 18 (01/31 2355) BP: (90-122)/(61-79) 90/61 mmHg (01/31 0613) SpO2:  [95 %-99 %] 99 % (01/31 0613)  PHYSICAL EXAMINATION: General:  Chronically ill appearing white female, not in acute distress.  Neuro:  Awake, alert, generalized weakness but otherwise no focal def  HEENT:  NCAT, MM are dry, neck veins flat. Coarse upper airway rhonchi  Cardiovascular:  RRR w/out MRG Lungs:  Scattered rhonchi that improve w/cough. Decreased bases. No accessory muscle use  Abdomen:  Soft, not tender, no OM Musculoskeletal:  Generalized weakness, equal st and bulk Skin:  Warm, dry intact    Recent Labs Lab 05/02/15 0550 05/04/15 1604 05/06/15 0525  NA 141 139 145  K 3.6 3.9 4.3  CL 108 104 113*  CO2 '26 28 26  '$ BUN <5* <5* <5*  CREATININE 0.32* 0.53 0.35*  GLUCOSE 93 80 86    Recent Labs Lab 05/04/15 1604 05/05/15  2694 05/06/15 0525  HGB 10.9* 10.5* 9.8*  HCT 35.9* 34.5* 31.6*  WBC 4.9 3.9* 2.7*  PLT 274 249 197   Dg Chest 2 View  05/04/2015  CLINICAL DATA:  Tripped over object and fell at home today. Right-sided chest pain. Shortness of breath. COPD. EXAM: CHEST  2 VIEW COMPARISON:  04/27/2015 FINDINGS: Heart size remains within normal limits. Bibasilar pulmonary infiltrates, left side greater than right, show no significant change. No evidence of pneumothorax or pleural effusion. IMPRESSION: Bibasilar pulmonary infiltrates, without significant interval change. Electronically Signed   By: Earle Gell M.D.   On: 05/04/2015 16:34   Dg Pelvis 1-2 Views  05/04/2015  CLINICAL DATA:  Fall.  Pelvic pain.  Initial encounter. EXAM: PELVIS - 1-2 VIEW  COMPARISON:  None. FINDINGS: Residual contrast material is seen within the rectosigmoid colon. This partially obscures visualization of the pelvis, however no definite pelvic fracture or other focal bone lesion identified. No evidence of pelvic joint diastasis. IMPRESSION: No pelvic fracture identified. Visualization limited by residual contrast material in rectosigmoid colon. Electronically Signed   By: Earle Gell M.D.   On: 05/04/2015 16:41   Ct Head Wo Contrast  05/04/2015  CLINICAL DATA:  Altered mental status and increase weakness over past 2 days. Metastatic thyroid cancer to brain. Previous craniotomy and radiation therapy. EXAM: CT HEAD WITHOUT CONTRAST TECHNIQUE: Contiguous axial images were obtained from the base of the skull through the vertex without intravenous contrast. COMPARISON:  04/18/2015 FINDINGS: There is no evidence of intracranial hemorrhage, brain edema, or other signs of acute infarction. No abnormal extraaxial fluid collections are identified. A 7 mm low-attenuation lesion in the left cerebellum remains stable, consistent with treated metastasis. No other intra-axial mass identified. No evidence of mass effect. Mild left parietal encephalomalacia remains stable with adjacent left craniotomy defect. Mild chronic small vessel disease again noted. IMPRESSION: Stable exam.  No acute intracranial findings. Stable 7 mm low-attenuation lesion in left cerebellum, consistent with treated metastasis. Electronically Signed   By: Earle Gell M.D.   On: 05/04/2015 16:55   Mr [name] WN Contrast  05/05/2015  CLINICAL DATA:  Increased weakness over the past 2 days with altered mental status. Thyroid cancer with brain metastases. Prior craniotomy and radiation therapy. Whole-brain radiation 02/06/2015-02/26/2015. EXAM: MRI HEAD WITHOUT AND WITH CONTRAST TECHNIQUE: Multiplanar, multiecho pulse sequences of the brain and surrounding structures were obtained without and with intravenous contrast.  CONTRAST:  12m MULTIHANCE GADOBENATE DIMEGLUMINE 529 MG/ML IV SOLN COMPARISON:  Head CT 05/04/2015 and MRI 01/23/2015 FINDINGS: The examination is mildly to moderately motion degraded despite using faster, more motion resistant imaging protocols and repeating some sequences. A partially empty sella is noted. There is no evidence of acute infarct, midline shift, or extra-axial fluid collection. Mild generalized cerebral atrophy is noted. Periventricular white matter T2 hyperintensities are nonspecific but compatible with mild chronic small vessel ischemic disease. Sequelae of left frontoparietal craniotomy are again identified with mild underlying encephalomalacia, gliosis, and chronic blood products, unchanged from the prior MRI. Cerebellar metastases have all decreased in size, with the largest lesion located in the superior left cerebellar hemisphere and measuring 9 mm (series 11, image 18, previously 15 mm) with associated blood products again noted and at most minimal residual edema. Left parietal lesion is now only faintly visible, measuring 1.5 mm (series 11, image 38, previously 8 mm). A left frontal lobe lesion is also now only faintly visible, measuring 3 x 1 mm (series 11, image 39, previously 9  x 5 mm). Other bilateral cerebral metastases on the prior study are no longer clearly identified. No new brain lesions are identified. Prior right cataract extraction is noted. Minimal paranasal sinus mucosal thickening, a small left maxillary sinus mucous retention cyst, and mild right and moderately large left mastoid effusions are noted. Major intracranial vascular flow voids are unchanged, with the left vertebral artery noted to be dominant. IMPRESSION: 1. Mildly to moderately motion degraded examination. No acute intracranial abnormality. 2. Decreased size of all brain metastases, with some no longer visible. 3. No new metastases identified within limitations of motion artifact. Electronically Signed   By:  Logan Bores M.D.   On: 05/05/2015 11:42    ASSESSMENT / PLAN:  Bibasilar pulmonary infiltrates: d-dx include recurrent aspiration, atelectasis (prefer these two), vs HCAP vs spread of her cancer. Given chronology would favor recurrent aspiration especially as the symptoms first started not long after completing XRT to head and neck, also has h/o fluctuant weakness. Given her functional status don't think there is anything to be gained by bronchoscopy here. Had a long d/w her husband simply about the burden of cancer in general and the expected progression of her disease given her current functional status. Also brought up the subject of life support. He indicated that she would not want to be on a life support machine.   Plan/rec Cont current abx Aspiration precautions Would benefit from palliative care consult-->doubt that there is anything else from a cancer stand-point there is to offer.  No role for bronch  Severe Dysphagia Plan Aspiration precautions  Metastatic thyroid cancer w/ severe protein calorie malnutrition, deconditioning and FTT.  Plan Strongly urge Palliative care consult  Erick Colace ACNP-BC [place] Pager # [phone] OR # [phone] if no answer   05/06/2015, 11:20 AM  Attending:  I have seen and examined the patient with nurse practitioner/resident and agree with the note above.  We formulated the plan together and I elicited the following history.    I saw Ms. Elnoria Howard with [facility] and met with her and her family today at length.  She has known aspiration pneumonia and returned with worsening dyspnea and cough and malaise.  She has been through this three or four times now since the fall when she completed palliative radiation therapy for her thyroid cancer.  On exam today Weak, sleeping Lungs with crackles left base, normal effort CV: rrr, no mg  CT from last month reviewed> tree-in-bud abnormalities left greater than right in the base with  patchy consolidation.  Dependent left lung  CXR from today > bibasilar infiltrates  IMpression/Plan Recurrent aspiration pneumonia due to dysphagia from XRT to head and neck: she has tree-in-bud abnormalities on her CT chest from last month which fits with aspiration pneumonia; the ddx includes atypical infections or sarcoidosis, but those are much less likely.   > would treat with IV antibiotics again > no role for feeding tube placement as this will not prevent aspiration > continue SLP evaluation  I met with her husband and daughter and answered their questions at length.  Roselie Awkward, MD [place] [facility] Pager: [phone] Cell: [phone] After 3pm or if no response, call [phone]

## 2015-05-06 NOTE — Clinical Social Work Note (Signed)
Clinical Social Work Assessment  Patient Details  Name: Haley Lowe MRN: [id number] Date of Birth: [birth year]  Date of referral:  05/06/15               Reason for consult:  Facility Placement                Permission sought to share information with:    Permission granted to share information::     Name::        Agency::     Relationship::     Contact Information:     Housing/Transportation Living arrangements for the past 2 months:  Single Family Home Source of Information:  Patient Patient Interpreter Needed:  None Criminal Activity/Legal Involvement Pertinent to Current Situation/Hospitalization:  No - Comment as needed Significant Relationships:  Adult Children, Spouse Lives with:  Spouse Do you feel safe going back to the place where you live?  Yes Need for family participation in patient care:  Yes (Comment)  Care giving concerns:  Pt admitted from home with spouse. PT recommending short-term rehab at [facility].   Social Worker assessment / plan:  CSW received referral for new SNF. BSW Intern met with pt and pt spouse at bedside. BSW Intern introduced self and explained role. BSW Intern discussed PT recommendation for SNF. Pt is disoriented to time and situation. Pt spouse stated he needs to speak with the pt's daughter about a decision. Pt spouse reports the pt's daughter works so he will have to call her later this evening. BSW Intern offered conducting a SNF search to be proactive. Pt spouse declined offer to conduct SNF search at this time. BSW Intern provided pt spouse with CSW phone number to call after decision regarding SNF has been made.  BSW Intern reported to [place]. CSW to anticipate phone call and follow-up regarding SNF decision.   Employment status:  Disabled (Comment on whether or not currently receiving Disability) Insurance information:  Managed Medicare PT Recommendations:  [place] / Referral to community resources:  [place]  Patient/Family's Response to care:  Pt alert and oriented x2. Pt reports remembering working with PT. Pt had little to say in conversation. Pt spouse appeared sad while conversing with BSW Intern. BSW Intern provided emotional support and inquired about any questions pt spose may have but he reported no further questions at this time.   Patient/Family's Understanding of and Emotional Response to Diagnosis, Current Treatment, and Prognosis:  Pt family aware of pt medical state and treatment needed.  Emotional Assessment Appearance:  Appears stated age Attitude/Demeanor/Rapport:   (Appropriate) Affect (typically observed):  Quiet, Unable to Assess Orientation:  Oriented to Self, Oriented to Place Alcohol / Substance use:  Not Applicable Psych involvement (Current and /or in the community):  No (Comment)  Discharge Needs  Concerns to be addressed:  Discharge Planning Concerns Readmission within the last 30 days:  Yes Current discharge risk:  None Barriers to Discharge:  Continued Medical Work up   [employer], Student-SW 05/06/2015, 12:04 PM

## 2015-05-06 NOTE — Progress Notes (Signed)
  Echocardiogram 2D Echocardiogram has been performed.  Haley Lowe 05/06/2015, 1:49 PM

## 2015-05-06 NOTE — Progress Notes (Signed)
PROGRESS NOTE  Haley Lowe XIP:[id number] DOB: 1956-04-02 DOA: 05/04/2015 PCP: [name], MD  HPI: Haley Lowe is a 60 y.o. female with a past medical history of essential hypertension, anxiety, depression, COPD, GERD, posterior arthritis, fibromyalgia, DVT/PE, metastatic thyroid cancer who was discharged 2 days ago from the hospital for healthcare associated pneumonia and is brought to the emergency department after her daughter reports increased weakness since yesterday.  Subjective / 24 H Interval events - Patient appears quite weak this morning - She denies any chest pain, denies any shortness of breath, however hasn't moved out of bed  Assessment/Plan: Principal Problem:   Near syncope Active Problems:   Metastatic adenocarcinoma to brain, thyroid cancer   COPD (chronic obstructive pulmonary disease) ([place])   Controlled diabetes mellitus type II without complication ([place])   HCAP (healthcare-associated pneumonia)   Weakness   Weakness / FTT in the setting of metastatic thyroid cancer - patient with known mets to the brain last October, s/p XRT finished in Nov 2016. Supposed to get MRI soon for follow up.  - Repeat MRI on 1/30 without any metastasis, shrinkage of the old metastasis as well as disappearance of the smaller ones - she is currently unsafe for home environment given recurrent falls and weakness - PT consult recommended SNF - I have called and discussed today with patient's primary oncologist at [facility] Dr. Melven Sartorius. It is unlikely that her weakness may be related to her Lenvatinib  ? HCAP (healthcare-associated pneumonia) - improving, recently hospitalized for same, suspect underlying aspiration playing a role as well - she is afebrile, has no leukocytosis - continue levofloxacin 750 mg by mouth daily. - I do wonder however since her pneumonia has been persistent for the last 2 months, she has bilateral lower fields infiltrates on the chest x-ray and CT  scan, which are persistent, whether this truly represents a bacterial infectious process. She has been on and off antibiotics for the last 2 months and she has been hospitalized 5 times. CT angiogram obtained on 1/23 raises concern for fungal/mycobacterial infections versus interstitial metastatic disease. I have discussed with her primary oncologist this morning, another possibility might be interstitial process in the setting of Lenvatinib. I have consulted pulmonology today to evaluate patient, appreciate input regarding further recommendations and whether bronchoscopy is indicated in this patient. If needed, patient's primary oncologist is okay to hold Lenvatinib for now.  COPD (chronic obstructive pulmonary disease) (HCC) - no wheezing, does not appear to have exacerbation - Continue albuterol nebulizer therapy as needed.  Controlled diabetes mellitus type II without complication (HCC) - SSI  Recurrent herpes zoster - continue valacyclovir to 1000 mg by mouth twice a day - Mouth lesions improving   Diet: DIET DYS 2 Room service appropriate?: Yes; Fluid consistency:: Thin Fluids: NS DVT Prophylaxis: Lovenox  Code Status: Full Code Family Communication: d/w husband bedside  Disposition Plan: remain inpatient. SNF vs home  Barriers to discharge: high risk of falls and injury, pulmonology evaluation  Consultants:  Pulmonary   Procedures:  None    Antibiotics  Levaquin 1/30 >>   Studies  Dg Chest 2 View  05/04/2015  CLINICAL DATA:  Tripped over object and fell at home today. Right-sided chest pain. Shortness of breath. COPD. EXAM: CHEST  2 VIEW COMPARISON:  04/27/2015 FINDINGS: Heart size remains within normal limits. Bibasilar pulmonary infiltrates, left side greater than right, show no significant change. No evidence of pneumothorax or pleural effusion. IMPRESSION: Bibasilar pulmonary infiltrates, without significant interval change.  Electronically Signed   By: Earle Gell  M.D.   On: 05/04/2015 16:34   Dg Pelvis 1-2 Views  05/04/2015  CLINICAL DATA:  Fall.  Pelvic pain.  Initial encounter. EXAM: PELVIS - 1-2 VIEW COMPARISON:  None. FINDINGS: Residual contrast material is seen within the rectosigmoid colon. This partially obscures visualization of the pelvis, however no definite pelvic fracture or other focal bone lesion identified. No evidence of pelvic joint diastasis. IMPRESSION: No pelvic fracture identified. Visualization limited by residual contrast material in rectosigmoid colon. Electronically Signed   By: Earle Gell M.D.   On: 05/04/2015 16:41   Ct Head Wo Contrast  05/04/2015  CLINICAL DATA:  Altered mental status and increase weakness over past 2 days. Metastatic thyroid cancer to brain. Previous craniotomy and radiation therapy. EXAM: CT HEAD WITHOUT CONTRAST TECHNIQUE: Contiguous axial images were obtained from the base of the skull through the vertex without intravenous contrast. COMPARISON:  04/18/2015 FINDINGS: There is no evidence of intracranial hemorrhage, brain edema, or other signs of acute infarction. No abnormal extraaxial fluid collections are identified. A 7 mm low-attenuation lesion in the left cerebellum remains stable, consistent with treated metastasis. No other intra-axial mass identified. No evidence of mass effect. Mild left parietal encephalomalacia remains stable with adjacent left craniotomy defect. Mild chronic small vessel disease again noted. IMPRESSION: Stable exam.  No acute intracranial findings. Stable 7 mm low-attenuation lesion in left cerebellum, consistent with treated metastasis. Electronically Signed   By: Earle Gell M.D.   On: 05/04/2015 16:55   Mr Haley Lowe [place] Contrast  05/05/2015  CLINICAL DATA:  Increased weakness over the past 2 days with altered mental status. Thyroid cancer with brain metastases. Prior craniotomy and radiation therapy. Whole-brain radiation 02/06/2015-02/26/2015. EXAM: MRI HEAD WITHOUT AND WITH CONTRAST  TECHNIQUE: Multiplanar, multiecho pulse sequences of the brain and surrounding structures were obtained without and with intravenous contrast. CONTRAST:  24m MULTIHANCE GADOBENATE DIMEGLUMINE 529 MG/ML IV SOLN COMPARISON:  Head CT 05/04/2015 and MRI 01/23/2015 FINDINGS: The examination is mildly to moderately motion degraded despite using faster, more motion resistant imaging protocols and repeating some sequences. A partially empty sella is noted. There is no evidence of acute infarct, midline shift, or extra-axial fluid collection. Mild generalized cerebral atrophy is noted. Periventricular white matter T2 hyperintensities are nonspecific but compatible with mild chronic small vessel ischemic disease. Sequelae of left frontoparietal craniotomy are again identified with mild underlying encephalomalacia, gliosis, and chronic blood products, unchanged from the prior MRI. Cerebellar metastases have all decreased in size, with the largest lesion located in the superior left cerebellar hemisphere and measuring 9 mm (series 11, image 18, previously 15 mm) with associated blood products again noted and at most minimal residual edema. Left parietal lesion is now only faintly visible, measuring 1.5 mm (series 11, image 38, previously 8 mm). A left frontal lobe lesion is also now only faintly visible, measuring 3 x 1 mm (series 11, image 39, previously 9 x 5 mm). Other bilateral cerebral metastases on the prior study are no longer clearly identified. No new brain lesions are identified. Prior right cataract extraction is noted. Minimal paranasal sinus mucosal thickening, a small left maxillary sinus mucous retention cyst, and mild right and moderately large left mastoid effusions are noted. Major intracranial vascular flow voids are unchanged, with the left vertebral artery noted to be dominant. IMPRESSION: 1. Mildly to moderately motion degraded examination. No acute intracranial abnormality. 2. Decreased size of all brain  metastases, with some no longer  visible. 3. No new metastases identified within limitations of motion artifact. Electronically Signed   By: Logan Bores M.D.   On: 05/05/2015 11:42    Objective  Filed Vitals:   05/05/15 0700 05/05/15 1242 05/05/15 2111 05/06/15 0613  BP: 126/78 122/79 110/68 90/61  Pulse: 107 104 106   Temp: 98 F (36.7 C) 97.6 F (36.4 C) 98.8 F (37.1 C) 97.8 F (36.6 C)  TempSrc: Oral Oral Oral Oral  Resp: '20 18 18 18  '$ Height:      Weight:      SpO2: 94% 97% 95% 99%    Intake/Output Summary (Last 24 hours) at 05/06/15 1103 Last data filed at 05/05/15 1900  Gross per 24 hour  Intake    980 ml  Output    650 ml  Net    330 ml   Filed Weights   05/04/15 2015  Weight: 64.501 kg (142 lb 3.2 oz)    Exam:  GENERAL: NAD, appears pale and weak  HEENT: no scleral icterus, PERRL  LUNGS: CTA biL, no wheezing  HEART: RRR without MRG, good peripheral pulses, no edema  ABDOMEN: soft, non tender  EXTREMITIES: no clubbing / cyanosis  NEUROLOGIC: non focal  PSYCHIATRIC: normal mood and affect  Data Reviewed: Basic Metabolic Panel:  Recent Labs Lab 04/30/15 0750 05/01/15 0920 05/02/15 0550 05/04/15 1604 05/06/15 0525  NA  --  143 141 139 145  K  --  2.7* 3.6 3.9 4.3  CL  --  110 108 104 113*  CO2  --  '24 26 28 26  '$ GLUCOSE  --  126* 93 80 86  BUN  --  <5* <5* <5* <5*  CREATININE 0.40* 0.36* 0.32* 0.53 0.35*  CALCIUM  --  7.9* 8.0* 8.2* 8.7*  MG  --  1.4* 1.9  --   --    Liver Function Tests:  Recent Labs Lab 05/04/15 1604  AST 30  ALT 15  ALKPHOS 79  BILITOT 0.6  PROT 5.0*  ALBUMIN 2.4*   CBC:  Recent Labs Lab 05/02/15 0550 05/04/15 1604 05/05/15 0516 05/06/15 0525  WBC 2.8* 4.9 3.9* 2.7*  NEUTROABS  --  3.6 2.8  --   HGB 11.2* 10.9* 10.5* 9.8*  HCT 37.5 35.9* 34.5* 31.6*  MCV 98.9 97.8 100.0 97.2  PLT 288 274 249 197   BNP (last 3 results)  Recent Labs  03/15/15 2100 04/18/15 2031  BNP 81.0 42.0    CBG:  Recent Labs Lab 05/05/15 0702 05/05/15 1224 05/05/15 1652 05/06/15 0732  GLUCAP 77 78 87 62*    Recent Results (from the past 240 hour(s))  Culture, blood (routine x 2) Call MD if unable to obtain prior to antibiotics being given     Status: None   Collection Time: 04/27/15 11:33 PM  Result Value Ref Range Status   Specimen Description BLOOD LEFT HAND  Final   Special Requests BOTTLES DRAWN AEROBIC ONLY 5CC  Final   Culture   Final    NO GROWTH 5 DAYS Performed at [facility]    Report Status 05/03/2015 FINAL  Final  Culture, sputum-assessment     Status: None   Collection Time: 04/28/15  7:45 PM  Result Value Ref Range Status   Specimen Description SPUTUM  Final   Special Requests Immunocompromised  Final   Sputum evaluation   Final    MICROSCOPIC FINDINGS SUGGEST THAT THIS SPECIMEN IS NOT REPRESENTATIVE OF LOWER RESPIRATORY SECRETIONS. PLEASE RECOLLECT. [facility] RN [phone]  [id number],P    Report Status 04/28/2015 FINAL  Final     Scheduled Meds: . enoxaparin (LOVENOX) injection  40 mg Subcutaneous QHS  . feeding supplement (ENSURE ENLIVE)  237 mL Oral BID BM  . levofloxacin  750 mg Oral Daily  . levothyroxine  175 mcg Oral QAC breakfast  . magnesium oxide  200 mg Oral Daily  . pantoprazole  40 mg Oral BID  . potassium chloride SA  40 mEq Oral BID  . sodium chloride flush  3 mL Intravenous Q12H  . SONAFINE  1 application Topical Daily  . valACYclovir  1,000 mg Oral BID   Continuous Infusions: . sodium chloride 100 mL/hr at 05/05/15 2135    Time spent: 35 minutes, more than 50% bedside, as well as discussing with pulmonology and her primary oncologist at [facility].   Marzetta Board, MD [facility] Pager [phone]. If 7 PM - 7 AM, please contact night-coverage at [email or web address], password [facility] 05/06/2015, 11:03 AM

## 2015-05-06 NOTE — Progress Notes (Signed)
Pharmacy Antibiotic Note  Haley Lowe is a 60 y.o. female admitted on 05/04/2015 with pneumonia.  Pharmacy has been consulted for ceftazidime dosing.  Patient has a documented allergy to PCN, but has tolerated both cefepime and ceftazidime during previous admissions.  Plan:  Ceftazidime 1g IV q8h Follow up renal function & cultures De-escalate as indicated  Height: '5\' 4"'$  (162.6 cm) Weight: 142 lb 3.2 oz (64.501 kg) IBW/kg (Calculated) : 54.7  Temp (24hrs), Avg:98.3 F (36.8 C), Min:97.8 F (36.6 C), Max:98.8 F (37.1 C)   Recent Labs Lab 04/30/15 0750 04/30/15 1615 05/01/15 0920 05/02/15 0550 05/04/15 1604 05/04/15 1613 05/05/15 0516 05/06/15 0525  WBC  --   --   --  2.8* 4.9  --  3.9* 2.7*  CREATININE 0.40*  --  0.36* 0.32* 0.53  --   --  0.35*  LATICACIDVEN  --   --   --   --   --  0.70  --   --   VANCOTROUGH  --  7*  --   --   --   --   --   --     Estimated Creatinine Clearance: 65.4 mL/min (by C-G formula based on Cr of 0.35).    Allergies  Allergen Reactions  . Penicillins Swelling    " my brain swelled, and mymouth" Has patient had a PCN reaction causing immediate rash, facial/tongue/throat swelling, SOB or lightheadedness with hypotension: Yes Has patient had a PCN reaction causing severe rash involving mucus membranes or skin necrosis: No Has patient had a PCN reaction that required hospitalization No Has patient had a PCN reaction occurring within the last 10 years: No If all of the above answers are "NO", then may proceed with Cephalosporin use.   . Other Swelling    PT ALSO REACTS TO PAPER TAPE AND BAND-AIDS.  Marland Kitchen Codeine Itching    Mild takes benadryl  . Potassium-Containing Compounds Other (See Comments)    Potassium Liquid only- burns the mouth and throat, pt is able to take tablets dissolved.  . Tape Rash    Antimicrobials this admission: PTA Valtrex >> 1/30 >> Levaquin >> 1/31 1/31 >> Ceftazidime >>  Dose adjustments this  admission: ---  Microbiology results: ---  Thank you for allowing pharmacy to be a part of this patient's care.  Peggyann Juba, PharmD, BCPS Pager: [phone] 05/06/2015 3:48 PM     05/06/2015 3:44 PM

## 2015-05-06 NOTE — Progress Notes (Signed)
Speech Language Pathology Treatment: Dysphagia  Patient Details Name: Haley Lowe MRN: [id number] DOB: [birth year] Today's Date: 05/06/2015 Time: 4098-1191 SLP Time Calculation (min) (ACUTE ONLY): 20 min  Assessment / Plan / Recommendation Clinical Impression  Pt seen to assess tolerance of modified diet.  Spouse Dean present and reports pt with much improved tolerance of thicker drinks vs thin.  He did state pt tolerated regular/thin diet well at home on Saturday after dc'd home Friday but then she became weak and her swallow ability worsened.  Note CCS documentation and recommendation for palliative referral.    Observed spouse giving pt thickened water via tsp - pt without indication of airway compromise.  She is only consuming a few bites/tsps of liquids per spouse.   Pt admits to thicker liquids increasing comfort with intake.  Recommend to continue dys2/honey with strict precautions.  Pt appears more confused and with increased congested cough than during prior admission.  SLP to follow to help mitigate dysphagia/aspiration for this unfortunate pt.    HPI HPI: 60 y.o. female with a past medical history of essential hypertension, anxiety, depression, COPD, GERD, posterior arthritis, fibromyalgia, DVT/PE, metastatic adenocarcinoma to brain, thyroid cancer, recurrent herpes zoster who was discharged 2 days ago from the hospital for healthcare associated pneumonia and is brought to the emergency department after her daughter reports increased weakness since yesterday. Followed by SLP during recent admission. MBS 1/24 revealed no aspiration, but delayed oral transit, lingual pumping, and vallecular residuals. Coughing was observed during study, but no barium observed in larynx/trachea. Regular diet with thin liquids was recommended, and pt was D/Cd home on this diet.       SLP Plan  Continue with current plan of care     Recommendations  Diet recommendations: Dysphagia 2 (fine  chop);Honey-thick liquid Liquids provided via: Teaspoon Medication Administration: Crushed with puree Supervision: Trained caregiver to feed patient (spouse observed by SLP while he fed spouse via tsp) Compensations: Slow rate;Small sips/bites Postural Changes and/or Swallow Maneuvers: Seated upright 90 degrees;Upright 30-60 min after meal             Oral Care Recommendations: Oral care BID Follow up Recommendations: Other (comment) (TBD) Plan: Continue with current plan of care     [place], [place], [facility] SLP [phone]

## 2015-05-07 DIAGNOSIS — R1319 Other dysphagia: Secondary | ICD-10-CM | POA: Diagnosis present

## 2015-05-07 DIAGNOSIS — K219 Gastro-esophageal reflux disease without esophagitis: Secondary | ICD-10-CM | POA: Diagnosis present

## 2015-05-07 DIAGNOSIS — E46 Unspecified protein-calorie malnutrition: Secondary | ICD-10-CM | POA: Diagnosis present

## 2015-05-07 DIAGNOSIS — J449 Chronic obstructive pulmonary disease, unspecified: Secondary | ICD-10-CM | POA: Diagnosis present

## 2015-05-07 DIAGNOSIS — R55 Syncope and collapse: Secondary | ICD-10-CM

## 2015-05-07 DIAGNOSIS — E119 Type 2 diabetes mellitus without complications: Secondary | ICD-10-CM | POA: Diagnosis not present

## 2015-05-07 DIAGNOSIS — R531 Weakness: Secondary | ICD-10-CM

## 2015-05-07 DIAGNOSIS — J438 Other emphysema: Secondary | ICD-10-CM | POA: Diagnosis not present

## 2015-05-07 DIAGNOSIS — C7931 Secondary malignant neoplasm of brain: Secondary | ICD-10-CM

## 2015-05-07 DIAGNOSIS — I10 Essential (primary) hypertension: Secondary | ICD-10-CM | POA: Diagnosis present

## 2015-05-07 DIAGNOSIS — Y92009 Unspecified place in unspecified non-institutional (private) residence as the place of occurrence of the external cause: Secondary | ICD-10-CM | POA: Diagnosis not present

## 2015-05-07 DIAGNOSIS — M797 Fibromyalgia: Secondary | ICD-10-CM | POA: Diagnosis present

## 2015-05-07 DIAGNOSIS — E039 Hypothyroidism, unspecified: Secondary | ICD-10-CM

## 2015-05-07 DIAGNOSIS — B370 Candidal stomatitis: Secondary | ICD-10-CM | POA: Diagnosis present

## 2015-05-07 DIAGNOSIS — J690 Pneumonitis due to inhalation of food and vomit: Secondary | ICD-10-CM | POA: Diagnosis present

## 2015-05-07 DIAGNOSIS — R627 Adult failure to thrive: Secondary | ICD-10-CM | POA: Diagnosis present

## 2015-05-07 DIAGNOSIS — Z87891 Personal history of nicotine dependence: Secondary | ICD-10-CM | POA: Diagnosis not present

## 2015-05-07 DIAGNOSIS — C73 Malignant neoplasm of thyroid gland: Secondary | ICD-10-CM | POA: Diagnosis present

## 2015-05-07 DIAGNOSIS — R296 Repeated falls: Secondary | ICD-10-CM | POA: Diagnosis present

## 2015-05-07 DIAGNOSIS — Z923 Personal history of irradiation: Secondary | ICD-10-CM | POA: Diagnosis not present

## 2015-05-07 DIAGNOSIS — Z6824 Body mass index (BMI) 24.0-24.9, adult: Secondary | ICD-10-CM | POA: Diagnosis not present

## 2015-05-07 DIAGNOSIS — Z86711 Personal history of pulmonary embolism: Secondary | ICD-10-CM | POA: Diagnosis not present

## 2015-05-07 DIAGNOSIS — W19XXXA Unspecified fall, initial encounter: Secondary | ICD-10-CM | POA: Diagnosis present

## 2015-05-07 DIAGNOSIS — J189 Pneumonia, unspecified organism: Secondary | ICD-10-CM | POA: Diagnosis not present

## 2015-05-07 MED ORDER — MEGESTROL ACETATE 400 MG/10ML PO SUSP
400.0000 mg | Freq: Every day | ORAL | Status: DC
  Administered 2015-05-07 – 2015-05-10 (×4): 400 mg via ORAL
  Filled 2015-05-07 (×4): qty 10

## 2015-05-07 NOTE — Care Management Obs Status (Signed)
[place] NOTIFICATION   Patient Details  Name: Luther Newhouse MRN: [id number] Date of Birth: March 21, 1956   Medicare Observation Status Notification Given:  Yes, given 05/06/15 to family member daughter. Pt was unable to sign.    Purcell Mouton, RN 05/07/2015, 9:09 AM

## 2015-05-07 NOTE — NC FL2 (Signed)
[place] LEVEL OF CARE SCREENING TOOL     IDENTIFICATION  Patient Name: Haley Lowe Birthdate: 01-25-1956 Sex: female Admission Date (Current Location): 05/04/2015  [facility] and [place] Number:  Herbalist and Address:  [facility],  [place] [address], [place]      Provider Number: [id number]  Attending Physician Name and Address:  Lavina Hamman, MD  Relative Name and Phone Number:       Current Level of Care: Hospital Recommended Level of Care: [place] Prior Approval Number:    Date Approved/Denied:   PASRR Number: [id number]  Discharge Plan: SNF    Current Diagnoses: Patient Active Problem List   Diagnosis Date Noted  . Weakness 05/04/2015  . Near syncope 05/04/2015  . Hypomagnesemia 05/01/2015  . HCAP (healthcare-associated pneumonia)   . PNA (pneumonia) 04/27/2015  . Palliative care encounter   . DNR (do not resuscitate) discussion   . Controlled diabetes mellitus type II without complication ([place]) 34/28/7681  . Pressure ulcer 03/16/2015  . Acute respiratory failure with hypoxia ([place]) 03/16/2015  . Antineoplastic chemotherapy induced pancytopenia ([place]) 03/16/2015  . Hypotension 03/15/2015  . Healthcare-associated pneumonia 03/15/2015  . 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 Mucositis oral   . Esophagitis 03/09/2015  . Protein-calorie malnutrition, severe 03/07/2015  . Dysphagia   . Odynophagia   . Barrett's esophagus with dysplasia   . Esophageal reflux   . Metastatic cancer ([place])   . Mucosal abnormality of esophagus   . Sepsis ([place]) 03/03/2015  . Aspiration pneumonia ([place]) 03/03/2015  . Mucositis due to radiation therapy 03/03/2015  . COPD (chronic obstructive pulmonary disease) ([place]) 03/03/2015  . Pneumonia 03/03/2015  . Hyponatremia 03/03/2015  . Hypokalemia 03/03/2015  . Leukopenia 03/03/2015  . Thrombocytopenia ([place]) 03/03/2015  . Hypothyroid 03/03/2015  . Dehydration   .  GERD (gastroesophageal reflux disease) 07/29/2014  . Chest tightness 07/28/2014  . Primary papillary thyroid carcinoma ([place]) 11/21/2013  . Occlusion and stenosis of carotid artery without mention of cerebral infarction 07/18/2012  . DVT (deep venous thrombosis) ([place]) 12/09/2011  . Pulmonary embolism ([place]) 12/09/2011  . COPD with asthma ([place]) 10/18/2011  . Allergy   . Fibromyalgia   . Pulmonary nodules   . Metastatic adenocarcinoma to brain, thyroid cancer     Orientation RESPIRATION BLADDER Height & Weight     Self, Place  O2 (3 L/min) Continent Weight: 142 lb 3.2 oz (64.501 kg) Height:  '5\' 4"'$  (162.6 cm)  BEHAVIORAL SYMPTOMS/MOOD NEUROLOGICAL BOWEL NUTRITION STATUS      Continent Diet (DYS 2 Room service appropriate; Fluid consistency: Thin )  AMBULATORY STATUS COMMUNICATION OF NEEDS Skin   Extensive Assist Verbally Normal                       Personal Care Assistance Level of Assistance  Bathing, Feeding, Dressing Bathing Assistance: Maximum assistance Feeding assistance: Limited assistance Dressing Assistance: Maximum assistance     Functional Limitations Info  Sight, Hearing, Speech Sight Info: Adequate Hearing Info: Adequate Speech Info: Adequate    SPECIAL CARE FACTORS FREQUENCY  PT (By licensed PT), OT (By licensed OT)     PT Frequency: 5x week OT Frequency: 5x week            Contractures Contractures Info: Present    Additional Factors Info  Code Status, Allergies Code Status Info: FULL Allergies Info: Penicillins, Codeine, Potassium-containing Compounds, Tape  Current Medications (05/07/2015):  This is the current hospital active medication list Current Facility-Administered Medications  Medication Dose Route Frequency Provider Last Rate Last Dose  . 0.9 %  sodium chloride infusion   Intravenous Continuous Reubin Milan, MD 100 mL/hr at 05/06/15 2220    . acetaminophen (TYLENOL) tablet 650 mg  650 mg Oral Q6H PRN Gardiner Barefoot, NP   650 mg at 05/07/15 0037  . albuterol (PROVENTIL) (2.5 MG/3ML) 0.083% nebulizer solution 2.5 mg  2.5 mg Nebulization Q4H PRN Reubin Milan, MD      . ALPRAZolam Duanne Moron) tablet 1 mg  1 mg Oral TID PRN Reubin Milan, MD   1 mg at 05/06/15 2218  . amitriptyline (ELAVIL) tablet 25 mg  25 mg Oral QHS Gardiner Barefoot, NP   25 mg at 05/06/15 2218  . benzonatate (TESSALON) capsule 100 mg  100 mg Oral TID PRN Reubin Milan, MD      . cefTAZidime (FORTAZ) 1 g in dextrose 5 % 50 mL IVPB  1 g Intravenous Q8H Emiliano Dyer, RPH   1 g at 05/07/15 0804  . clotrimazole (GYNE-LOTRIMIN) vaginal cream 1 Applicatorful  1 Applicatorful Vaginal QHS PRN Reubin Milan, MD   1 Applicatorful at 04/88/89 2134  . enoxaparin (LOVENOX) injection 40 mg  40 mg Subcutaneous QHS Reubin Milan, MD   40 mg at 05/06/15 2217  . feeding supplement (ENSURE ENLIVE) (ENSURE ENLIVE) liquid 237 mL  237 mL Oral BID BM Maricela Bo [place], RD   237 mL at 05/06/15 0942  . levothyroxine (SYNTHROID, LEVOTHROID) tablet 175 mcg  175 mcg Oral QAC breakfast Reubin Milan, MD   175 mcg at 05/07/15 0804  . magic mouthwash w/lidocaine  5 mL Oral QID PRN Reubin Milan, MD      . magnesium oxide (MAG-OX) tablet 200 mg  200 mg Oral Daily Reubin Milan, MD   200 mg at 05/06/15 0943  . nystatin (MYCOSTATIN) 100000 UNIT/ML suspension 500,000 Units  5 mL Mouth/Throat QID PRN Reubin Milan, MD   500,000 Units at 05/06/15 1454  . ondansetron (ZOFRAN) tablet 4 mg  4 mg Oral Q6H PRN Reubin Milan, MD       Or  . ondansetron [facility]) injection 4 mg  4 mg Intravenous Q6H PRN Reubin Milan, MD      . ondansetron (ZOFRAN-ODT) disintegrating tablet 8 mg  8 mg Oral Q8H PRN Reubin Milan, MD      . oxyCODONE (Oxy IR/ROXICODONE) immediate release tablet 20 mg  20 mg Oral Q4H PRN Reubin Milan, MD   20 mg at 05/07/15 0804  . pantoprazole (PROTONIX) EC tablet 40 mg  40 mg Oral BID  Reubin Milan, MD   40 mg at 05/06/15 2218  . polyethylene glycol (MIRALAX / GLYCOLAX) packet 17 g  17 g Oral Daily PRN Reubin Milan, MD      . potassium chloride SA (K-DUR,KLOR-CON) CR tablet 40 mEq  40 mEq Oral BID Reubin Milan, MD   40 mEq at 05/06/15 2218  . sodium chloride flush (NS) 0.9 % injection 3 mL  3 mL Intravenous Q12H Reubin Milan, MD   3 mL at 05/06/15 2220  . SONAFINE emulsion 1 application  1 application Topical Daily Reubin Milan, MD   1 application at 16/94/50 [phone]  . sucralfate (CARAFATE) tablet 1 g  1 g Oral QID PRN Reubin Milan, MD  1 g at 05/06/15 1642  . valACYclovir (VALTREX) tablet 1,000 mg  1,000 mg Oral BID Reubin Milan, MD   1,000 mg at 05/06/15 2221     Discharge Medications: Please see discharge summary for a list of discharge medications.  Relevant Imaging Results:  Relevant Lab Results:   Additional Information SSN: [id number]  Standley Brooking, LCSW

## 2015-05-07 NOTE — Progress Notes (Signed)
[facility] Progress Note  Patient: Haley Lowe NKN:[id number]   PCP: Haley Lowe., MD DOB: 02/15/1956   DOA: 05/04/2015   DOS: 05/07/2015   Date of Service: the patient was seen and examined on 05/07/2015  Subjective: Patient is reportedly more awake and oriented. Denies any acute complaint. Complains of some cough. No nausea no vomiting. Able to tolerate oral diet. Activity: Ambulating in the room. Last BM: 05/06/2015  Assessment and Plan: 1. HCAP (healthcare-associated pneumonia) She presents with complaints of near syncope. Workup suggested bibasilar pulmonary infiltrate. Appreciate input from pulmonary. Patient appears to have recurrent aspiration pneumonia due to dysphagia. Recommendations on the pulmonary is to continue with IV antibiotics at present. We'll monitor for clinical response. Recommend against feeding tube as well as PICC line as per family's request as there will be harboring more infection and would not be any beneficial.  2. Near syncope. Generalized weakness. CT of the head and MRI of the brain are negative for any acute abnormality. Physical therapy Therapy recommends SNF. Earlier provider also discussed with patient's primary oncologist will do not think to the patient's weakness is secondary to his ongoing chemotherapy.  3. Failure to thrive with poor oral intake. Protein calorie malnutrition. Hypoglycemia. We'll start her on Megace for appetite stimulation. Dietitian is also following with the patient. Continuing nutritional supplementation.  4. 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 MRA of the brain shows decrease in the size of the metastasis. 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 Currently on dysphagia type diet. Palliative care was consulted in order to admission last week. Will discussed with the daughter again regarding goals of care.  5. Hypothyroidism. Continuing Synthroid.  6. GERD. Continuing PPI.  DVT  Prophylaxis: subcutaneous Heparin Nutrition: Dysphagia type II diet Advance goals of care discussion: Full code at present  Brief Summary of Hospitalization:  HPI: As per the H and P dictated on admission, "Haley Lowe is a 60 y.o. female with a past medical history of essential hypertension, anxiety, depression, COPD, GERD, posterior arthritis, fibromyalgia, DVT/PE, metastatic thyroid cancer who was discharged 2 days ago from the hospital for healthcare associated pneumonia and is brought to the emergency department after her daughter reports increased weakness since yesterday.  Her daughter stated that she was able to ambulate on Thursday and Friday after being discharged, but since yesterday, the patient has had some difficulty ambulating. She tried to go to the bathroom by herself, became dizzy and fell on the floor hitting her head on the left occipital area and her left hip area. Patient denies fever, chills, chest pain, palpitations, diaphoresis, nausea or emesis.   Her daughter states that the patient has been having trouble keeping up fluid and food intake at home. She states that despite the best effort from the patient to keep herself hydrated, she is struggling to do this. Her daughter was inquiring about the possibility of a PICC line to have IV hydration at home, but I pointed out that this would only be a temporary solution.   When seen in the emergency department, patient was in no acute distress. Workup reveals a normal UA, unremarkable chemistry, mild anemia, on change chest radiograph infiltrates, no acute findings on CT scan."  Daily update, Procedures: Pulmonary consulted 05/06/2015, speech therapy consulted 05/05/2015 Consultants: Pulmonary, Antibiotics: Anti-infectives    Start     Dose/Rate Route Frequency Ordered Stop   05/06/15 1600  cefTAZidime (FORTAZ) 1 g in dextrose 5 % 50 mL IVPB     1  g 100 mL/hr over 30 Minutes Intravenous Every 8 hours 05/06/15 1548      05/05/15 1000  levofloxacin (LEVAQUIN) tablet 750 mg  Status:  Discontinued     750 mg Oral Daily 05/04/15 2034 05/06/15 1531   05/04/15 2045  valACYclovir (VALTREX) tablet 1,000 mg     1,000 mg Oral 2 times daily 05/04/15 2034       Family Communication: family was present at bedside, at the time of interview.  Opportunity was given to ask question and all questions were answered satisfactorily.   Disposition:  Expected discharge date: 05/08/2015 Barriers to safe discharge: Disposition for further care   Intake/Output Summary (Last 24 hours) at 05/07/15 1140 Last data filed at 05/07/15 0804  Gross per 24 hour  Intake   1050 ml  Output   2100 ml  Net  -1050 ml   Filed Weights   05/04/15 2015  Weight: 64.501 kg (142 lb 3.2 oz)    Objective: Physical Exam: Filed Vitals:   05/05/15 2111 05/06/15 0613 05/06/15 2151 05/07/15 0702  BP: 110/68 90/61 105/65 100/60  Pulse: 106     Temp: 98.8 F (37.1 C) 97.8 F (36.6 C) 98.2 F (36.8 C) 98.1 F (36.7 C)  TempSrc: Oral Oral Oral Oral  Resp: '18 18 18 18  '$ Height:      Weight:      SpO2: 95% 99% 98% 98%     General: Appear in mild distress, no Rash; Oral Mucosa moist. Cardiovascular: S1 and S2 Present, no Murmur, no JVD Respiratory: Bilateral Air entry present and basal Crackles, no wheezes Abdomen: Bowel Sound present, Soft and no tenderness Extremities: no Pedal edema, no calf tenderness Neurology: Grossly no focal neuro deficit.  Data Reviewed: CBC:  Recent Labs Lab 05/02/15 0550 05/04/15 1604 05/05/15 0516 05/06/15 0525  WBC 2.8* 4.9 3.9* 2.7*  NEUTROABS  --  3.6 2.8  --   HGB 11.2* 10.9* 10.5* 9.8*  HCT 37.5 35.9* 34.5* 31.6*  MCV 98.9 97.8 100.0 97.2  PLT 288 274 249 185   Basic Metabolic Panel:  Recent Labs Lab 05/01/15 0920 05/02/15 0550 05/04/15 1604 05/06/15 0525  NA 143 141 139 145  K 2.7* 3.6 3.9 4.3  CL 110 108 104 113*  CO2 '24 26 28 26  '$ GLUCOSE 126* 93 80 86  BUN <5* <5* <5* <5*    CREATININE 0.36* 0.32* 0.53 0.35*  CALCIUM 7.9* 8.0* 8.2* 8.7*  MG 1.4* 1.9  --   --    Liver Function Tests:  Recent Labs Lab 05/04/15 1604  AST 30  ALT 15  ALKPHOS 79  BILITOT 0.6  PROT 5.0*  ALBUMIN 2.4*   No results for input(s): LIPASE, AMYLASE in the last 168 hours. No results for input(s): AMMONIA in the last 168 hours.  Cardiac Enzymes: No results for input(s): CKTOTAL, CKMB, CKMBINDEX, TROPONINI in the last 168 hours.  BNP (last 3 results)  Recent Labs  03/15/15 2100 04/18/15 2031  BNP 81.0 42.0    CBG:  Recent Labs Lab 05/05/15 0702 05/05/15 1224 05/05/15 1652 05/06/15 0732  GLUCAP 77 78 87 62*    Recent Results (from the past 240 hour(s))  Culture, blood (routine x 2) Call MD if unable to obtain prior to antibiotics being given     Status: None   Collection Time: 04/27/15 11:33 PM  Result Value Ref Range Status   Specimen Description BLOOD LEFT HAND  Final   Special Requests BOTTLES DRAWN AEROBIC ONLY 5CC  Final   Culture   Final    NO GROWTH 5 DAYS Performed at [facility]    Report Status 05/03/2015 FINAL  Final  Culture, sputum-assessment     Status: None   Collection Time: 04/28/15  7:45 PM  Result Value Ref Range Status   Specimen Description SPUTUM  Final   Special Requests Immunocompromised  Final   Sputum evaluation   Final    MICROSCOPIC FINDINGS SUGGEST THAT THIS SPECIMEN IS NOT REPRESENTATIVE OF LOWER RESPIRATORY SECRETIONS. PLEASE RECOLLECT. [facility] RN [phone] [place],N    Report Status 04/28/2015 FINAL  Final     Studies: No results found.   Scheduled Meds: . amitriptyline  25 mg Oral QHS  . cefTAZidime (FORTAZ)  IV  1 g Intravenous Q8H  . enoxaparin (LOVENOX) injection  40 mg Subcutaneous QHS  . feeding supplement (ENSURE ENLIVE)  237 mL Oral BID BM  . levothyroxine  175 mcg Oral QAC breakfast  . magnesium oxide  200 mg Oral Daily  . megestrol  400 mg Oral Daily  . pantoprazole  40 mg Oral BID  .  potassium chloride SA  40 mEq Oral BID  . sodium chloride flush  3 mL Intravenous Q12H  . SONAFINE  1 application Topical Daily  . valACYclovir  1,000 mg Oral BID   Continuous Infusions: . sodium chloride 100 mL/hr at 05/06/15 2220   PRN Meds: acetaminophen, albuterol, ALPRAZolam, benzonatate, clotrimazole, magic mouthwash w/lidocaine, nystatin, ondansetron **OR** ondansetron (ZOFRAN) IV, ondansetron, oxyCODONE, polyethylene glycol, sucralfate  Time spent: 30 minutes  Author: Berle Mull, MD [facility] Pager: [phone] 05/07/2015 11:40 AM  If 7PM-7AM, please contact night-coverage at [email or web address], password [facility]

## 2015-05-07 NOTE — Progress Notes (Signed)
Physical Therapy Treatment Patient Details Name: Priyah Schmuck MRN: [id number] DOB: 1955-11-30 Today's Date: 05/07/2015    History of Present Illness 60 yo female admitted with near syncope, fall, confusion. Hx of COPD-O2 dep, thyroid cancer, brain mets, fibromyalgia, HTN, DM    PT Comments    Pt attempting to get up to [facility] without nursing assistance (bed alarm sounding). Assisted pt into standing and performed stand pivot x 2, bed<>bsc< with 2 HHA and +2 assist. Pt is very unsteady and at risk for falls. General weakness and impaired balance noted. Pt was able to take a few side steps up towards [facility] before being assisted back into supine. Encouraged pt and husband to call for assistance for OOB. Continue to recommend SNF.  Follow Up Recommendations  SNF     Equipment Recommendations  None recommended by PT    Recommendations for Other Services OT consult     Precautions / Restrictions Precautions Precautions: Fall Precaution Comments: O2 dep Restrictions Weight Bearing Restrictions: No    Mobility  Bed Mobility Overal bed mobility: Needs Assistance Bed Mobility: Sit to Supine       Sit to supine: Mod assist;+2 for physical assistance;+2 for safety/equipment   General bed mobility comments: Assist for trunk and bil LEs. Increased time. Multiomodal cueing for initiation/completion of task. Utilized bedpad for scooting, positioning  Transfers Overall transfer level: Needs assistance Equipment used: 2 person hand held assist Transfers: Sit to/from [facility] Sit to Stand: Mod assist;+2 physical assistance;+2 safety/equipment Stand pivot transfers: Mod assist;+2 physical assistance;+2 safety/equipment       General transfer comment: Assist to rise, stabilize, control descent. Multimodal cueing for safety, hand placement. Posterior leaning. Stand pivot x2, bed<>bsc. Increased time.   Ambulation/Gait             General Gait Details: NT   Stairs            Wheelchair Mobility    Modified Rankin (Stroke Patients Only)       Balance Overall balance assessment: Needs assistance         Standing balance support: Bilateral upper extremity supported;During functional activity Standing balance-Leahy Scale: Poor Standing balance comment: pt tends to maintain very wide BOS. Unable to perform toilet hygiene without maximal support in standing                    Cognition Arousal/Alertness: Awake/alert Behavior During Therapy: Flat affect Overall Cognitive Status: Impaired/Different from baseline Area of Impairment: Following commands;Orientation;Memory;Problem solving Orientation Level: Disoriented to;Place;Time;Situation   Memory: Decreased short-term memory Following Commands: Follows one step commands with increased time     Problem Solving: Requires tactile cues;Requires verbal cues;Difficulty sequencing;Slow processing      Exercises      General Comments        Pertinent Vitals/Pain Pain Assessment: Faces Faces Pain Scale: Hurts little more Pain Location: generalized pain with mobility Pain Intervention(s): Repositioned    Home Living                      Prior Function            PT Goals (current goals can now be found in the care plan section) Progress towards PT goals: Progressing toward goals (very slowly)    Frequency  Min 3X/week    PT Plan Current plan remains appropriate    Co-evaluation             End of Session Equipment Utilized  During Treatment: Oxygen Activity Tolerance: Patient limited by fatigue Patient left: in bed;with call bell/phone within reach;with bed alarm set;with family/visitor present     Time: 0488-8916 PT Time Calculation (min) (ACUTE ONLY): 8 min  Charges:  $Therapeutic Activity: 8-22 mins                    G Codes:      Weston Anna, MPT Pager: [phone]

## 2015-05-07 NOTE — Progress Notes (Signed)
SLP spoke to Dr Posey Pronto to assure his is agreeable to diet modification to honey thick liquids due to pt's poor tolerance of thinner consistencies causing her to overtly cough/choke and be uncomfortable with intake. Per spouse,Dean, pt needs thickener in drinks due to her overt coughing with thins prior to this admit.  Clarified order in EPIC and reinforced to pt/family plan to continue honey thick liquids for pt's comfort and aspiration mitigation.   Luanna Salk, [facility] SLP [phone]

## 2015-05-08 LAB — GLUCOSE, CAPILLARY: Glucose-Capillary: 114 mg/dL — ABNORMAL HIGH (ref 65–99)

## 2015-05-08 MED ORDER — CEFPODOXIME PROXETIL 200 MG PO TABS
200.0000 mg | ORAL_TABLET | Freq: Two times a day (BID) | ORAL | Status: DC
Start: 2015-05-08 — End: 2015-05-10
  Administered 2015-05-08 – 2015-05-10 (×5): 200 mg via ORAL
  Filled 2015-05-08 (×7): qty 1

## 2015-05-08 NOTE — Progress Notes (Signed)
This RN received report from [place], Therapist, sports. I agree with assessment. Patient resting comfortably. Will continue to monitor closely

## 2015-05-08 NOTE — Progress Notes (Signed)
[facility] Progress Note  Patient: Haley Lowe MEQ:[id number]   PCP: Haley Lowe., MD DOB: [birth year]   DOA: 05/04/2015   DOS: 05/08/2015   Date of Service: the patient was seen and examined on 05/08/2015  Subjective: Patient does not offer much answers. But was somewhat agitated this morning. Patient denied any acute complaint. As per husband. He has not been eating much. Activity: Ambulating in the room. Last BM: 05/06/2015  Assessment and Plan: 1. HCAP (healthcare-associated pneumonia) She presents with complaints of near syncope. Workup suggested bibasilar pulmonary infiltrate. Appreciate input from pulmonary. Patient appears to have recurrent aspiration pneumonia due to dysphagia. Currently we will change to oral antibiotic Vantin. Monitor in the hospital for clinical response. Also discontinue IV fluids.  2. Near syncope. Generalized weakness. CT of the head and MRI of the brain are negative for any acute abnormality. Physical therapy Therapy recommends SNF. Earlier provider also discussed with patient's primary oncologist will do not think to the patient's weakness is secondary to his ongoing chemotherapy.  3. Failure to thrive with poor oral intake. Protein calorie malnutrition. Hypoglycemia. We'll start her on Megace for appetite stimulation. Dietitian is also following with the patient. Continuing nutritional supplementation. Discontinue IV fluid. Patient may need calorie count.  4. 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 MRA of the brain shows decrease in the size of the metastasis. 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 Currently on dysphagia type diet. Palliative care was consulted in earlier admission last week. Will discussed with the daughter again regarding goals of care.  5. Hypothyroidism. Continuing Synthroid.  6. GERD. Continuing PPI.  7. Goals of care discussion. Had a prolonged discussion with patient's daughter. She  tells me that primary decision maker is patient's husband and he as well as the patient and her sound mind would like her to be treated as aggressively as can. They had Questions about possible feeding tube versus PEG tube. I recommended against it since it will not prevent her from having recurrent aspiration. Also today had a question about PICC line to give her IV hydration and antibiotics at home and I also recommended against that since the patient does not have any indication for having a PICC line and it can be a harbinger of infection. We will discuss with husband tomorrow regarding further goals of care to have some clarity on her treatment plan.  DVT Prophylaxis: subcutaneous Heparin Nutrition: Dysphagia type II diet Advance goals of care discussion: Full code at present  Brief Summary of Hospitalization:  HPI: As per the H and P dictated on admission, "Haley Lowe is a 60 y.o. female with a past medical history of essential hypertension, anxiety, depression, COPD, GERD, posterior arthritis, fibromyalgia, DVT/PE, metastatic thyroid cancer who was discharged 2 days ago from the hospital for healthcare associated pneumonia and is brought to the emergency department after her daughter reports increased weakness since yesterday.  Her daughter stated that she was able to ambulate on Thursday and Friday after being discharged, but since yesterday, the patient has had some difficulty ambulating. She tried to go to the bathroom by herself, became dizzy and fell on the floor hitting her head on the left occipital area and her left hip area. Patient denies fever, chills, chest pain, palpitations, diaphoresis, nausea or emesis.   Her daughter states that the patient has been having trouble keeping up fluid and food intake at home. She states that despite the best effort from the patient to keep herself hydrated, she is  struggling to do this. Her daughter was inquiring about the possibility of a PICC  line to have IV hydration at home, but I pointed out that this would only be a temporary solution.   When seen in the emergency department, patient was in no acute distress. Workup reveals a normal UA, unremarkable chemistry, mild anemia, on change chest radiograph infiltrates, no acute findings on CT scan."  Daily update, Procedures: Pulmonary consulted 05/06/2015, speech therapy consulted 05/05/2015 Consultants: Pulmonary, Antibiotics: Anti-infectives    Start     Dose/Rate Route Frequency Ordered Stop   05/08/15 1400  cefpodoxime (VANTIN) tablet 200 mg     200 mg Oral Every 12 hours 05/08/15 1322     05/06/15 1600  cefTAZidime (FORTAZ) 1 g in dextrose 5 % 50 mL IVPB  Status:  Discontinued     1 g 100 mL/hr over 30 Minutes Intravenous Every 8 hours 05/06/15 1548 05/08/15 1321   05/05/15 1000  levofloxacin (LEVAQUIN) tablet 750 mg  Status:  Discontinued     750 mg Oral Daily 05/04/15 2034 05/06/15 1531   05/04/15 2045  valACYclovir (VALTREX) tablet 1,000 mg     1,000 mg Oral 2 times daily 05/04/15 2034       Family Communication: family was present at bedside, at the time of interview.  Opportunity was given to ask question and all questions were answered satisfactorily.   Disposition:  Expected discharge date: 05/10/2015 Barriers to safe discharge: Clinical response after switching antibiotics and stopping fluids   Intake/Output Summary (Last 24 hours) at 05/08/15 1849 Last data filed at 05/08/15 1300  Gross per 24 hour  Intake   1490 ml  Output   1100 ml  Net    390 ml   Filed Weights   05/04/15 2015  Weight: 64.501 kg (142 lb 3.2 oz)    Objective: Physical Exam: Filed Vitals:   05/07/15 1348 05/07/15 2130 05/08/15 0548 05/08/15 1428  BP: 112/69 110/68 137/83 131/77  Pulse: 102 95 104 108  Temp: 98.8 F (37.1 C) 98 F (36.7 C) 97.5 F (36.4 C) 98.4 F (36.9 C)  TempSrc: Oral Oral Oral Oral  Resp: '18 18 18 16  '$ Height:      Weight:      SpO2: 97% 98% 97% 97%       General: Appear in mild distress, no Rash; Oral Mucosa moist. Cardiovascular: S1 and S2 Present, no Murmur, no JVD Respiratory: Bilateral Air entry present and basal Crackles, no wheezes Abdomen: Bowel Sound present, Soft and no tenderness Extremities: no Pedal edema, no calf tenderness Neurology: Grossly no focal neuro deficit.  Data Reviewed: CBC:  Recent Labs Lab 05/02/15 0550 05/04/15 1604 05/05/15 0516 05/06/15 0525  WBC 2.8* 4.9 3.9* 2.7*  NEUTROABS  --  3.6 2.8  --   HGB 11.2* 10.9* 10.5* 9.8*  HCT 37.5 35.9* 34.5* 31.6*  MCV 98.9 97.8 100.0 97.2  PLT 288 274 249 010   Basic Metabolic Panel:  Recent Labs Lab 05/02/15 0550 05/04/15 1604 05/06/15 0525  NA 141 139 145  K 3.6 3.9 4.3  CL 108 104 113*  CO2 '26 28 26  '$ GLUCOSE 93 80 86  BUN <5* <5* <5*  CREATININE 0.32* 0.53 0.35*  CALCIUM 8.0* 8.2* 8.7*  MG 1.9  --   --    Liver Function Tests:  Recent Labs Lab 05/04/15 1604  AST 30  ALT 15  ALKPHOS 79  BILITOT 0.6  PROT 5.0*  ALBUMIN 2.4*   No  results for input(s): LIPASE, AMYLASE in the last 168 hours. No results for input(s): AMMONIA in the last 168 hours.  Cardiac Enzymes: No results for input(s): CKTOTAL, CKMB, CKMBINDEX, TROPONINI in the last 168 hours.  BNP (last 3 results)  Recent Labs  03/15/15 2100 04/18/15 2031  BNP 81.0 42.0    CBG:  Recent Labs Lab 05/05/15 0702 05/05/15 1224 05/05/15 1652 05/06/15 0732 05/08/15 0802  GLUCAP 77 78 87 62* 114*    Recent Results (from the past 240 hour(s))  Culture, sputum-assessment     Status: None   Collection Time: 04/28/15  7:45 PM  Result Value Ref Range Status   Specimen Description SPUTUM  Final   Special Requests Immunocompromised  Final   Sputum evaluation   Final    MICROSCOPIC FINDINGS SUGGEST THAT THIS SPECIMEN IS NOT REPRESENTATIVE OF LOWER RESPIRATORY SECRETIONS. PLEASE RECOLLECT. [facility] RN [phone] [place],N    Report Status 04/28/2015 FINAL  Final      Studies: No results found.   Scheduled Meds: . amitriptyline  25 mg Oral QHS  . cefpodoxime  200 mg Oral Q12H  . enoxaparin (LOVENOX) injection  40 mg Subcutaneous QHS  . feeding supplement (ENSURE ENLIVE)  237 mL Oral BID BM  . levothyroxine  175 mcg Oral QAC breakfast  . magnesium oxide  200 mg Oral Daily  . megestrol  400 mg Oral Daily  . pantoprazole  40 mg Oral BID  . potassium chloride SA  40 mEq Oral BID  . sodium chloride flush  3 mL Intravenous Q12H  . SONAFINE  1 application Topical Daily  . valACYclovir  1,000 mg Oral BID   Continuous Infusions:   PRN Meds: acetaminophen, albuterol, ALPRAZolam, benzonatate, clotrimazole, magic mouthwash w/lidocaine, nystatin, ondansetron **OR** ondansetron (ZOFRAN) IV, ondansetron, oxyCODONE, polyethylene glycol, sucralfate  Time spent: 30 minutes  Author: Berle Mull, MD [facility] Pager: [phone] 05/08/2015 6:49 PM  If 7PM-7AM, please contact night-coverage at [email or web address], password [facility]

## 2015-05-08 NOTE — Progress Notes (Signed)
Physical Therapy Treatment Patient Details Name: Haley Lowe MRN: [id number] DOB: [birth year] Today's Date: 05/08/2015    History of Present Illness 60 yo female admitted with near syncope, fall, confusion. Hx of COPD-O2 dep, thyroid cancer, brain mets, fibromyalgia, HTN, DM    PT Comments    Assisted pt OOB to [facility] + 2 assist for safety.  Pt very weak and unsteady.  Required hand over hand direction and assist to complete turning hips.  Amb a limited distance using B platform EVA walker for increased support and daughter assisted by following with chair.  Lowest RA sat was 87% and highest HR was 117.  Limited activity tolerance.  Required extended sitting rest break between walks.  Assisted back to bed with max c/o fatigue.   Pt will need ST Rehab at SNF prior to safely returning home.   Follow Up Recommendations  SNF     Equipment Recommendations       Recommendations for Other Services       Precautions / Restrictions Precautions Precautions: Fall Precaution Comments: monitor sats Restrictions Weight Bearing Restrictions: No    Mobility  Bed Mobility Overal bed mobility: Needs Assistance Bed Mobility: Supine to Sit;Sit to Supine     Supine to sit: Mod assist;+2 for physical assistance;+2 for safety/equipment;HOB elevated Sit to supine: Mod assist;+2 for physical assistance;+2 for safety/equipment   General bed mobility comments: Assist for trunk and bil LEs. Increased time. Multiomodal cueing for initiation/completion of task. Utilized bedpad for scooting, positioning.  Caution to posterior LOB.   Transfers Overall transfer level: Needs assistance Equipment used: None Transfers: Stand Pivot Transfers Sit to Stand: Mod assist;+2 physical assistance;+2 safety/equipment         General transfer comment: assisted fro elevated bed to [facility] 1/4 turn with hand over hand cueing for proper placement and tactile cueing on hips to target toilet.  Uncontrolled decend.     Ambulation/Gait Ambulation/Gait assistance: +2 safety/equipment;Mod assist Ambulation Distance (Feet): 110 Feet (55 feet x 2 with one sitting rest break) Assistive device: Bilateral platform walker (EVA walker) Gait Pattern/deviations: Step-through pattern;Step-to pattern;Drifts right/left;Decreased step length - right;Decreased step length - left;Shuffle Gait velocity: decr   General Gait Details: used EVA walker for increased support sue to severe weakness.  Very unsteady, druken gait.  Assisted need to controll walker and to prevent posterior lean.     Stairs            Wheelchair Mobility    Modified Rankin (Stroke Patients Only)       Balance                                    Cognition Arousal/Alertness: Awake/alert Behavior During Therapy: Flat affect Overall Cognitive Status: Impaired/Different from baseline                      Exercises      General Comments        Pertinent Vitals/Pain Pain Assessment: No/denies pain    Home Living                      Prior Function            PT Goals (current goals can now be found in the care plan section) Progress towards PT goals: Progressing toward goals    Frequency  Min 3X/week    PT Plan Current plan remains  appropriate    Co-evaluation             End of Session Equipment Utilized During Treatment: Gait belt Activity Tolerance: Patient limited by fatigue Patient left: in bed;with call bell/phone within reach;with bed alarm set;with family/visitor present     Time: 1455-1515 PT Time Calculation (min) (ACUTE ONLY): 20 min  Charges:  $Gait Training: 8-22 mins                    G Codes:      Rica Koyanagi  PTA [facility]  Acute  Rehab Pager      [phone]

## 2015-05-08 NOTE — Progress Notes (Signed)
[facility]  Patient Status: Active (receiving services up to time of hospitalization)  [facility] is providing the following services: RN, PT and OT and HHA  If patient discharges after hours, please call [phone].   Haley Lowe 05/08/2015, 3:28 PM

## 2015-05-09 LAB — BASIC METABOLIC PANEL
ANION GAP: 7 (ref 5–15)
CO2: 22 mmol/L (ref 22–32)
Calcium: 8.5 mg/dL — ABNORMAL LOW (ref 8.9–10.3)
Chloride: 110 mmol/L (ref 101–111)
Creatinine, Ser: 0.32 mg/dL — ABNORMAL LOW (ref 0.44–1.00)
GFR calc Af Amer: >60 mL/min (ref >60)
GLUCOSE: 112 mg/dL — AB (ref 65–99)
POTASSIUM: 3.9 mmol/L (ref 3.5–5.1)
Sodium: 139 mmol/L (ref 135–145)

## 2015-05-09 LAB — GLUCOSE, CAPILLARY: Glucose-Capillary: 92 mg/dL (ref 65–99)

## 2015-05-09 MED ORDER — RESOURCE THICKENUP CLEAR PO POWD
ORAL | Status: DC | PRN
  Filled 2015-05-09: qty 125

## 2015-05-09 NOTE — Progress Notes (Signed)
Report received from Pike, RN.  No change assessment. Continue plan of care. Stacey Drain

## 2015-05-09 NOTE — Care Management Note (Signed)
Case Management Note  Patient Details  Name: Keylin Ferryman MRN: [id number] Date of Birth: 1955/07/12  Subjective/Objective:     Pt admitted with PNA, confusion               Action/Plan: Plan to discharge home or SNF.     Expected Discharge Date:                  Expected Discharge Plan:  [place]  In-House Referral:  Clinical Social Work  Discharge planning Services  CM Consult  Post Acute Care Choice:    Choice offered to:     DME Arranged:    DME Agency:     HH Arranged:  RN, PT, OT, Nurse's Aide, Social Work [employer] Agency:  [place]  Status of Service:  In process, will continue to follow  Medicare Important Message Given:    Date Medicare IM Given:    Medicare IM give by:    Date Additional Medicare IM Given:    Additional Medicare Important Message give by:     If discussed at [place] of Stay Meetings, dates discussed:    Additional CommentsPurcell Mouton, RN 05/09/2015, 11:11 AM

## 2015-05-09 NOTE — Progress Notes (Addendum)
Nutrition Follow-up  DOCUMENTATION CODES:   Not applicable  INTERVENTION:  - Continue Ensure BID - Will order Resource Thicken Up (please mix this with all thin liquids, including Ensure) - RD will continue to monitor for needs  NUTRITION DIAGNOSIS:   Increased nutrient needs related to catabolic illness, cancer and cancer related treatments as evidenced by estimated needs. -ongoing  GOAL:   Patient will meet greater than or equal to 90% of their needs -unmet  MONITOR:   PO intake, Supplement acceptance, Weight trends, Labs, I & O's  ASSESSMENT:   60 y.o. female with a past medical history of essential hypertension, anxiety, depression, COPD, GERD, posterior arthritis, fibromyalgia, DVT/PE, metastatic thyroid cancer who was discharged 2 days ago from the hospital for healthcare associated pneumonia and is brought to the emergency department after her daughter reports increased weakness since yesterday.  2/3 Per chart review, pt ate 10% of breakfast, 20% of lunch, and 30% of dinner 1/31; 30% of breakfast, 40% of lunch, and 30% of dinner 2/1; 0% of breakfast and lunch yesterday (2/2). Pt resting at time of RD visit and daughter, who is at bedside, reports pt is in severe pain this AM. Daughter reports pt was able to eat a pancake for breakfast this AM with good tolerance. Daughter had question regarding mouth care and free water protocol following mouth care. RD alerted SLP to this question and SLP reports plan to follow-up with pt later today.   Pt currently ordered honey-thick liquids; Resource Thicken Up order is not longer in place (as it was on 1/30) so will reorder should thin liquids, such as Ensure, be provided. Pt not meeting needs. Continue to be unable to perform physical assessment. Will continue to monitor for POC/GOC. Medications reviewed. Labs reviewed; CBGs: 62-114 mg/dL, BUN <5 mg/dL, creatinine low, Ca: 8.5 mg/dL.  ADDENDUM: Megace order now in place for hopeful  appetite stimulation.    1/30 - Pt is well known to this RD from hospitalization from which she was d/c'ed on the 26th or 27th.  - No intakes documented; spoke with RN who reports that SLP saw pt earlier today and that pt has been given applesauce with medications but that she has not seen pt eat anything else today.  - PT worked with pt right before RD visit. No family or visitors present at this time.  - Pt was resting when RD entered the room with hand over her face. Did not open eyes to name call x2.  - Felt that it was not necessary to fully awaken pt at this time. - Unable to perform physical assessment.  - Will switch from Boost to Ensure due to protein content difference.  - Will monitor for need to further adjust supplements based on abilities and needs with thickened liquids.  - Per chart review, pt has gained 3 lbs in the past 1 week; will continue to monitor weight trends as this may be fluid related.    Diet Order:  DIET DYS 2 Room service appropriate?: Yes; Fluid consistency:: Honey Thick  Skin:  Reviewed, no issues  Last BM:  1/31  Height:   Ht Readings from Last 1 Encounters:  05/04/15 '5\' 4"'$  (1.626 m)    Weight:   Wt Readings from Last 1 Encounters:  05/04/15 142 lb 3.2 oz (64.501 kg)    Ideal Body Weight:  54.54 kg (kg)  BMI:  Body mass index is 24.4 kg/(m^2).  Estimated Nutritional Needs:   Kcal:  1920-2175 (30-34 kcal/kg)  Protein:  77-90 grams (1.2-1.4 grams/kg)  Fluid:  >/= 2.1 L/day  EDUCATION NEEDS:   No education needs identified at this time     Jarome Matin, RD, LDN Inpatient Clinical Dietitian Pager # [phone] After hours/weekend pager # [phone]

## 2015-05-09 NOTE — Progress Notes (Signed)
Speech Language Pathology Treatment:    Patient Details Name: Haley Lowe MRN: [id number] DOB: 1956/02/19 Today's Date: 05/09/2015 Time: 1615-1700 SLP Time Calculation (min) (ACUTE ONLY): 45 min  Assessment / Plan / Recommendation Clinical Impression  Pt sleepy but agreed to participate in treatment session to determine readiness for dietary advancement. Upon questioning why pt's intake was poor, she did not verbalize specifics - denies displeasure with food choices.  However in talking with pt and spouse, they both agree, she may consume more with dietary advancement.    Observed pt consuming ice chips, honey thick juice via tsp/cup, medicine given by RN with applesauce, and graham cracker.  Pt had difficulties orally transiting tylenol and required extra boluses of applesauce and honey thick liquids to transit.  Her attention is impaired and she tends to hold po = Marlou Sa *her spouse of 17 years* prompts her beautifully to encourage pt to swallow.  No indication of aspiration with intake observed.  Pt did demonstrate congested breathing x1 which cleared with cue to cough/clear her throat.  Ice chip consumption observed - again with oral holding but NO indication of airway compromise nor pain with swallow *as noted upon initially admitted.  Suspect thrush and lesions are healing - thus decreasing odynophagia.   Will advance diet to dys3/honey and allow ice chips as pt demonstrating improvement with her functional swallow.  Hopefully advancement will also encourage intake and improve QOL.    SLP provided pt/spouse with written information re: frazier water protocol and advised to return to its use upon dc home.  Also advise to advance to nectar liquids when pt tolerates well without overtly coughing.  Dysphagia and aspiration risk will be chronic due to pt's deconditioning and complex medical issues.  Strategies in place to mitigate risk however and she has excellent family support.    HPI HPI: 60 y.o.  female with a past medical history of essential hypertension, anxiety, depression, COPD, GERD, posterior arthritis, fibromyalgia, DVT/PE, metastatic adenocarcinoma to brain, thyroid cancer, recurrent herpes zoster who was discharged 2 days ago from the hospital for healthcare associated pneumonia and is brought to the emergency department after her daughter reports increased weakness since yesterday. Followed by SLP during recent admission. MBS 1/24 revealed no aspiration, but delayed oral transit, lingual pumping, and vallecular residuals. Coughing was observed during study, but no barium observed in larynx/trachea. Regular diet with thin liquids was recommended, and pt was D/Cd home on this diet.       SLP Plan  Continue with current plan of care     Recommendations  Diet recommendations: Dysphagia 3 (mechanical soft);Honey-thick liquid (ice chips) Liquids provided via: Cup;No straw Medication Administration: Whole meds with puree (if large and not contraindicated - crush) Supervision: Trained caregiver to feed patient;Full supervision/cueing for compensatory strategies (caregiver independently helps pt) Compensations: Slow rate;Small sips/bites (clear throat if voice gurgly) Postural Changes and/or Swallow Maneuvers: Seated upright 90 degrees;Upright 30-60 min after meal             Oral Care Recommendations: Oral care BID Follow up Recommendations: Home health SLP Plan: Continue with current plan of care     [place], [place] [facility] SLP [phone]

## 2015-05-09 NOTE — Progress Notes (Signed)
PT Cancellation Note  Patient Details Name: Haley Lowe MRN: [id number] DOB: 13-Sep-1955   Cancelled Treatment:     checked on twice.  Pt having a bad day.  Pain/tearful.     Nathanial Rancher 05/09/2015, 2:37 PM

## 2015-05-09 NOTE — Progress Notes (Signed)
CSW confirmed with patient's daughter at bedside, [name] that patient will be returning home with home health services, declining SNF placement. Per daughter, family will transport home as well.   No further CSW needs identified - CSW signing off.   Raynaldo Opitz, [facility] Clinical Social Worker cell #: [phone]

## 2015-05-09 NOTE — Progress Notes (Signed)
[facility] Progress Note  Patient: Haley Lowe SAY:[id number]   PCP: [name], MD DOB: 1955-11-11   DOA: 05/04/2015   DOS: 05/09/2015   Date of Service: the patient was seen and examined on 05/09/2015  Subjective: patient does not maintain eye contact while the interview but does not offer any complains, constantly changing her position to find a comfortable place. Activity: Ambulating in the room. Last BM: 05/06/2015  Assessment and Plan: 1. HCAP (healthcare-associated pneumonia) She presents with complaints of near syncope. Workup suggested bibasilar pulmonary infiltrate. Appreciate input from pulmonary. Patient appears to have recurrent aspiration pneumonia due to dysphagia. Currently we will change to oral antibiotic Vantin. No worsening reported at present.  2. Near syncope. Generalized weakness. CT of the head and MRI of the brain are negative for any acute abnormality. Physical therapy Therapy recommends SNF. Earlier provider also discussed with patient's primary oncologist will do not think to the patient's weakness is secondary to his ongoing chemotherapy.  3. Failure to thrive with poor oral intake. Protein calorie malnutrition. Hypoglycemia. We'll start her on Megace for appetite stimulation. Dietitian is also following with the patient. Continuing nutritional supplementation.  4. 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 MRA of the brain shows decrease in the size of the metastasis. 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 Currently on dysphagia type diet. Palliative care was consulted in earlier admission last week and family and patient has decided to remain full code with aggressive therapy.  5. Hypothyroidism. Continuing Synthroid.  6. GERD. Continuing PPI.  7. Goals of care discussion. Had a prolonged discussion with patient's daughter and husband with patient. Explained about progressive decline associated with poor  nutrition and aspiration as well as metastatic thyroid cancer. As per my discussion with the patient and husband, patient would prefer to remain at home as much as she can and also family would like her to remain at home as much as she can. But she would still want as much as aggressive care and would like to remain full code, and is not considering consultation with palliative care or hospice at present.   They had questions about possible feeding tube versus PEG tube. I recommended against it since it will not prevent her from having recurrent aspiration. They also had a question about PICC line to give her IV hydration and antibiotics at home and I also recommended against that since the patient does not have any indication for having a PICC line and it can be a harbinger of infection.  DVT Prophylaxis: subcutaneous Heparin Nutrition: Dysphagia type II diet  Advance goals of care discussion: Full code at present  Brief Summary of Hospitalization:  HPI: As per the H and P dictated on admission, "Haley Lowe is a 60 y.o. female with a past medical history of essential hypertension, anxiety, depression, COPD, GERD, posterior arthritis, fibromyalgia, DVT/PE, metastatic thyroid cancer who was discharged 2 days ago from the hospital for healthcare associated pneumonia and is brought to the emergency department after her daughter reports increased weakness since yesterday.  Her daughter stated that she was able to ambulate on Thursday and Friday after being discharged, but since yesterday, the patient has had some difficulty ambulating. She tried to go to the bathroom by herself, became dizzy and fell on the floor hitting her head on the left occipital area and her left hip area. Patient denies fever, chills, chest pain, palpitations, diaphoresis, nausea or emesis.   Her daughter states that the patient has been having  trouble keeping up fluid and food intake at home. She states that despite the best  effort from the patient to keep herself hydrated, she is struggling to do this. Her daughter was inquiring about the possibility of a PICC line to have IV hydration at home, but I pointed out that this would only be a temporary solution.   When seen in the emergency department, patient was in no acute distress. Workup reveals a normal UA, unremarkable chemistry, mild anemia, on change chest radiograph infiltrates, no acute findings on CT scan."  Daily update, Procedures: Pulmonary consulted 05/06/2015, speech therapy consulted 05/05/2015 Consultants: Pulmonary, Antibiotics: Anti-infectives    Start     Dose/Rate Route Frequency Ordered Stop   05/08/15 1400  cefpodoxime (VANTIN) tablet 200 mg     200 mg Oral Every 12 hours 05/08/15 1322     05/06/15 1600  cefTAZidime (FORTAZ) 1 g in dextrose 5 % 50 mL IVPB  Status:  Discontinued     1 g 100 mL/hr over 30 Minutes Intravenous Every 8 hours 05/06/15 1548 05/08/15 1321   05/05/15 1000  levofloxacin (LEVAQUIN) tablet 750 mg  Status:  Discontinued     750 mg Oral Daily 05/04/15 2034 05/06/15 1531   05/04/15 2045  valACYclovir (VALTREX) tablet 1,000 mg     1,000 mg Oral 2 times daily 05/04/15 2034       Family Communication: family was present at bedside, at the time of interview.  Opportunity was given to ask question and all questions were answered satisfactorily.   Disposition:  Expected discharge date: 05/10/2015 Barriers to safe discharge: Clinical response after switching antibiotics and stopping fluids   Intake/Output Summary (Last 24 hours) at 05/09/15 1621 Last data filed at 05/09/15 1300  Gross per 24 hour  Intake    480 ml  Output    700 ml  Net   -220 ml   Filed Weights   05/04/15 2015  Weight: 64.501 kg (142 lb 3.2 oz)    Objective: Physical Exam: Filed Vitals:   05/08/15 2253 05/09/15 0020 05/09/15 0639 05/09/15 1334  BP: 134/102 135/74 116/76 147/87  Pulse: 115  98 115  Temp: 97.6 F (36.4 C)  98.4 F (36.9 C)  97.3 F (36.3 C)  TempSrc: Oral   Oral  Resp: '16  16 18  '$ Height:      Weight:      SpO2: 96%  95% 92%    General: Appear in mild distress, no Rash; Oral Mucosa moist. Cardiovascular: S1 and S2 Present, no Murmur, no JVD Respiratory: Bilateral Air entry present and basal Crackles, no wheezes Abdomen: Bowel Sound present, Soft and no tenderness Extremities: no Pedal edema, no calf tenderness  Data Reviewed: CBC:  Recent Labs Lab 05/04/15 1604 05/05/15 0516 05/06/15 0525  WBC 4.9 3.9* 2.7*  NEUTROABS 3.6 2.8  --   HGB 10.9* 10.5* 9.8*  HCT 35.9* 34.5* 31.6*  MCV 97.8 100.0 97.2  PLT 274 249 932   Basic Metabolic Panel:  Recent Labs Lab 05/04/15 1604 05/06/15 0525 05/09/15 0504  NA 139 145 139  K 3.9 4.3 3.9  CL 104 113* 110  CO2 '28 26 22  '$ GLUCOSE 80 86 112*  BUN <5* <5* <5*  CREATININE 0.53 0.35* 0.32*  CALCIUM 8.2* 8.7* 8.5*   Liver Function Tests:  Recent Labs Lab 05/04/15 1604  AST 30  ALT 15  ALKPHOS 79  BILITOT 0.6  PROT 5.0*  ALBUMIN 2.4*   No results for input(s): LIPASE,  AMYLASE in the last 168 hours. No results for input(s): AMMONIA in the last 168 hours.  Cardiac Enzymes: No results for input(s): CKTOTAL, CKMB, CKMBINDEX, TROPONINI in the last 168 hours.  BNP (last 3 results)  Recent Labs  03/15/15 2100 04/18/15 2031  BNP 81.0 42.0    CBG:  Recent Labs Lab 05/05/15 1224 05/05/15 1652 05/06/15 0732 05/08/15 0802 05/09/15 0724  GLUCAP 78 87 62* 114* 92    No results found for this or any previous visit (from the past 240 hour(s)).   Studies: No results found.   Scheduled Meds: . amitriptyline  25 mg Oral QHS  . cefpodoxime  200 mg Oral Q12H  . enoxaparin (LOVENOX) injection  40 mg Subcutaneous QHS  . feeding supplement (ENSURE ENLIVE)  237 mL Oral BID BM  . levothyroxine  175 mcg Oral QAC breakfast  . magnesium oxide  200 mg Oral Daily  . megestrol  400 mg Oral Daily  . pantoprazole  40 mg Oral BID  . potassium  chloride SA  40 mEq Oral BID  . sodium chloride flush  3 mL Intravenous Q12H  . SONAFINE  1 application Topical Daily  . valACYclovir  1,000 mg Oral BID   Continuous Infusions:   PRN Meds: acetaminophen, albuterol, ALPRAZolam, benzonatate, clotrimazole, magic mouthwash w/lidocaine, nystatin, ondansetron **OR** ondansetron (ZOFRAN) IV, ondansetron, oxyCODONE, polyethylene glycol, RESOURCE THICKENUP CLEAR, sucralfate  Time spent: 30 minutes  Author: Berle Mull, MD [facility] Pager: [phone] 05/09/2015 4:21 PM  If 7PM-7AM, please contact night-coverage at [email or web address], password [facility]

## 2015-05-10 LAB — BASIC METABOLIC PANEL
ANION GAP: 10 (ref 5–15)
CALCIUM: 8.5 mg/dL — AB (ref 8.9–10.3)
CO2: 19 mmol/L — AB (ref 22–32)
Chloride: 110 mmol/L (ref 101–111)
Creatinine, Ser: 0.35 mg/dL — ABNORMAL LOW (ref 0.44–1.00)
GFR calc Af Amer: >60 mL/min (ref >60)
GFR calc non Af Amer: >60 mL/min (ref >60)
GLUCOSE: 94 mg/dL (ref 65–99)
POTASSIUM: 3.8 mmol/L (ref 3.5–5.1)
Sodium: 139 mmol/L (ref 135–145)

## 2015-05-10 MED ORDER — ACETAMINOPHEN 325 MG PO TABS: 650.0000 mg | ORAL_TABLET | Freq: Four times a day (QID) | ORAL | Status: AC | PRN

## 2015-05-10 MED ORDER — CEFPODOXIME PROXETIL 200 MG PO TABS: 200.0000 mg | ORAL_TABLET | Freq: Two times a day (BID) | ORAL | Status: AC

## 2015-05-10 MED ORDER — MEGESTROL ACETATE 400 MG/10ML PO SUSP: 400.0000 mg | Freq: Every day | ORAL | Status: AC

## 2015-05-10 MED ORDER — FLUCONAZOLE 100 MG PO TABS
200.0000 mg | ORAL_TABLET | Freq: Once | ORAL | Status: AC
  Administered 2015-05-10: 200 mg via ORAL
  Filled 2015-05-10: qty 2

## 2015-05-10 MED ORDER — AMITRIPTYLINE HCL 25 MG PO TABS: 25.0000 mg | ORAL_TABLET | Freq: Every day | ORAL | Status: DC

## 2015-05-10 MED ORDER — ENSURE ENLIVE PO LIQD: 237.0000 mL | Freq: Two times a day (BID) | ORAL | Status: DC

## 2015-05-10 MED ORDER — FLUCONAZOLE 100 MG PO TABS: 100.0000 mg | ORAL_TABLET | Freq: Every day | ORAL | Status: AC

## 2015-05-10 NOTE — Care Management (Signed)
[place] notified of addition of SLP to home health orders. Presenter, broadcasting BSN CCM

## 2015-05-10 NOTE — Care Management Important Message (Signed)
Important Message  Patient Details  Name: Haley Lowe MRN: [id number] Date of Birth: November 30, 1955   Medicare Important Message Given:  Yes    Apolonio Schneiders, RN 05/10/2015, 11:41 AM

## 2015-05-12 NOTE — Discharge Summary (Signed)
[facility] Discharge Summary   Patient: Haley Lowe OVF:[id number]   PCP: Haley Lowe., MD DOB: 1956/01/08   Date of admission: 05/04/2015   Date of discharge: 05/10/2015    Discharge Diagnoses:  Principal Problem:   HCAP (healthcare-associated pneumonia) Active Problems:   Metastatic adenocarcinoma to brain, thyroid cancer   GERD (gastroesophageal reflux disease)   COPD (chronic obstructive pulmonary disease) ([place])   Hypothyroid   Controlled diabetes mellitus type II without complication ([place])   Weakness   Near syncope  Recommendations for Outpatient Follow-up:  1.  please follow up with PCP and oncology and follow aspiration precaution at home all the time  Follow-up Information    Follow up with Haley Lowe., MD. Schedule an appointment as soon as possible for a visit in 1 week.   Specialty:  Internal Medicine   Contact information:   [address] [place] [address] [phone]       Follow up with Porosnicu, Dewitt Hoes, MD. Call in 1 week.   Specialty:  Internal Medicine   Contact information:   [place] [address] [phone]      Diet recommendation: regular diet, dysphagia type 3.  Activity: The patient is advised to gradually reintroduce usual activities.  Discharge Condition: good  History of present illness: As per the H and P dictated on admission, "Haley Lowe is a 60 y.o. female with a past medical history of essential hypertension, anxiety, depression, COPD, GERD, posterior arthritis, fibromyalgia, DVT/PE, metastatic thyroid cancer who was discharged 2 days ago from the hospital for healthcare associated pneumonia and is brought to the emergency department after her daughter reports increased weakness since yesterday.  Her daughter stated that she was able to ambulate on Thursday and Friday after being discharged, but since yesterday, the patient has had some difficulty ambulating. She tried to go to the  bathroom by herself, became dizzy and fell on the floor hitting her head on the left occipital area and her left hip area. Patient denies fever, chills, chest pain, palpitations, diaphoresis, nausea or emesis.   Her daughter states that the patient has been having trouble keeping up fluid and food intake at home. She states that despite the best effort from the patient to keep herself hydrated, she is struggling to do this. Her daughter was inquiring about the possibility of a PICC line to have IV hydration at home, but I pointed out that this would only be a temporary solution.   When seen in the emergency department, patient was in no acute distress. Workup reveals a normal UA, unremarkable chemistry, mild anemia, on change chest radiograph infiltrates, no acute findings on CT scan."  Hospital Course:  Summary of her active problems in the hospital is as following. 1. HCAP (healthcare-associated pneumonia) Oral thrush She presents with complaints of near syncope. Workup suggested bibasilar pulmonary infiltrate. Appreciate input from pulmonary. Patient appears to have recurrent aspiration pneumonia due to dysphagia. Currently we will change to oral antibiotic Vantin. Patient did not have any evidence of worsening infection after switching to oral antibiotics. Patient discharged to complete a total of 10 day treatment course. Patient also has developed oropharyngeal thrush and for which she will also be on Diflucan 2 weeks treatment.  2. Near syncope. Generalized weakness. CT of the head and MRI of the brain are negative for any acute abnormality. No acute event on the telemetry as well, echo cardiac exam shows severe pulmonary hypertension but do not show any acute valvular abnormality or  other cardiac dysfunction Physical therapy Therapy recommends SNF. Earlier provider also discussed with patient's primary oncologist will do not think to the patient's weakness is secondary to his ongoing  chemotherapy.  3. Failure to thrive with poor oral intake. Protein calorie malnutrition. Hypoglycemia. We'll start her on Megace for appetite stimulation. Dietitian is also following with the patient. Continuing nutritional supplementation.  4. 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 MRA of the brain shows decrease in the size of the metastasis. 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 Currently on dysphagia type 3 diet. Palliative care was consulted in earlier admission last week and family and patient has decided to remain full code with aggressive therapy.  5. Hypothyroidism. Continuing Synthroid.  6. GERD. Continuing PPI.  7. Goals of care discussion. Had a prolonged discussion with patient's daughter and husband with patient. Explained about progressive decline associated with poor nutrition and aspiration as well as metastatic thyroid cancer. As per my discussion with the patient and husband, patient would prefer to remain at home as much as she can and also family would like her to remain at home as much as she can. But she would still want as much as aggressive care and would like to remain full code, and is not considering consultation with palliative care or hospice at present.  They had questions about possible feeding tube versus PEG tube. I recommended against it since it will not prevent her from having recurrent aspiration. They also had a question about PICC line to give her IV hydration and antibiotics at home and I also recommended against that since the patient does not have any indication for having a PICC line and it can be a harbinger of infection.  All other chronic medical condition were stable during the hospitalization.  Patient was seen by physical therapy, who recommended home health, which was arranged by Education officer, museum and case Freight forwarder. On the day of the discharge the patient's vitals stable, and no other acute medical condition were  reported by patient. the patient was felt safe to be discharge at home with home health.  Procedures and Results:  TTE: Ejection fraction 64-40%, grade 1 diastolic dysfunction, pulmonary hypertension   Consultations:  Pulmonary, palliative care  DISCHARGE MEDICATION: Discharge Medication List as of 05/10/2015 12:44 PM    START taking these medications   Details  acetaminophen (TYLENOL) 325 MG tablet Take 2 tablets (650 mg total) by mouth every 6 (six) hours as needed for mild pain., Starting 05/10/2015, Until Discontinued, OTC    cefpodoxime (VANTIN) 200 MG tablet Take 1 tablet (200 mg total) by mouth every 12 (twelve) hours., Starting 05/10/2015, Until Tue 05/13/15, Normal    fluconazole (DIFLUCAN) 100 MG tablet Take 1 tablet (100 mg total) by mouth daily., Starting 05/11/2015, Until Fri 05/23/15, Normal    megestrol (MEGACE) 400 MG/10ML suspension Take 10 mLs (400 mg total) by mouth daily., Starting 05/10/2015, Until Discontinued, Normal      CONTINUE these medications which have CHANGED   Details  amitriptyline (ELAVIL) 25 MG tablet Take 1 tablet (25 mg total) by mouth at bedtime., Starting 05/10/2015, Until Discontinued, Normal    feeding supplement, ENSURE ENLIVE, (ENSURE ENLIVE) LIQD Take 237 mLs by mouth 2 (two) times daily between meals., Starting 05/10/2015, Until Discontinued, Normal      CONTINUE these medications which have NOT CHANGED   Details  albuterol (PROVENTIL) (2.5 MG/3ML) 0.083% nebulizer solution Take 3 mLs (2.5 mg total) by nebulization every 4 (four) hours as needed  for wheezing or shortness of breath., Starting 04/25/2015, Until Discontinued, Normal    ALPRAZolam (XANAX) 1 MG tablet Take 1 mg by mouth 3 (three) times daily as needed for anxiety., Until Discontinued, Historical Med    benzonatate (TESSALON) 100 MG capsule Take 1 capsule (100 mg total) by mouth 3 (three) times daily as needed for cough., Starting 05/02/2015, Until Discontinued, Print    clotrimazole  (GYNE-LOTRIMIN) 1 % vaginal cream Place 1 Applicatorful vaginally at bedtime., Starting 05/02/2015, Until Discontinued, Print    levothyroxine (SYNTHROID, LEVOTHROID) 175 MCG tablet Take 175 mcg by mouth daily before breakfast., Until Discontinued, Historical Med    magic mouthwash w/lidocaine SOLN Take 5 mLs by mouth 4 (four) times daily as needed for mouth pain., Starting 03/13/2015, Until Discontinued, Print    Magnesium 250 MG TABS Take 1 tablet by mouth daily., Until Discontinued, Historical Med    nystatin (MYCOSTATIN) 100000 UNIT/ML suspension Use as directed 5 mLs (500,000 Units total) in the mouth or throat 4 (four) times daily. Take for 1 week then stop., Starting 05/02/2015, Until Discontinued, Print    ondansetron (ZOFRAN ODT) 8 MG disintegrating tablet Take 1 tablet (8 mg total) by mouth every 8 (eight) hours as needed for nausea or vomiting., Starting 04/05/2014, Until Discontinued, Print    Oxycodone HCl 20 MG TABS Take 1 tablet by mouth every 4 (four) hours as needed (pain)., Until Discontinued, Historical Med    pantoprazole (PROTONIX) 40 MG tablet Take 1 tablet (40 mg total) by mouth 2 (two) times daily., Starting 03/13/2015, Until Discontinued, Print    polyethylene glycol (MIRALAX / GLYCOLAX) packet Take 17 g by mouth daily as needed for moderate constipation. Reported on 04/27/2015, Until Discontinued, Historical Med    potassium chloride SA (K-DUR,KLOR-CON) 20 MEQ tablet Take 40 mEq by mouth 2 (two) times daily. Dissolve in water and take either in applesauce or drink the water., Until Discontinued, Historical Med    sucralfate (CARAFATE) 1 G tablet Take 1 tablet (1 g total) by mouth 4 (four) times daily -  with meals and at bedtime., Starting 03/13/2015, Until Discontinued, Print    valACYclovir (VALTREX) 1000 MG tablet Take 1 tablet (1,000 mg total) by mouth daily., Starting 04/25/2015, Until Discontinued, Normal    Wound Dressings (SONAFINE) Apply 1 application topically  daily., Until Discontinued, Historical Med      STOP taking these medications     levofloxacin (LEVAQUIN) 750 MG tablet        Allergies  Allergen Reactions  . Penicillins Swelling    " my brain swelled, and mymouth" Has patient had a PCN reaction causing immediate rash, facial/tongue/throat swelling, SOB or lightheadedness with hypotension: Yes Has patient had a PCN reaction causing severe rash involving mucus membranes or skin necrosis: No Has patient had a PCN reaction that required hospitalization No Has patient had a PCN reaction occurring within the last 10 years: No If all of the above answers are "NO", then may proceed with Cephalosporin use.   . Other Swelling    PT ALSO REACTS TO PAPER TAPE AND BAND-AIDS.  Marland Kitchen Codeine Itching    Mild takes benadryl  . Potassium-Containing Compounds Other (See Comments)    Potassium Liquid only- burns the mouth and throat, pt is able to take tablets dissolved.  . Tape Rash   Discharge Instructions    DIET DYS 3    Complete by:  As directed   Fluid consistency:  Honey Thick     Discharge instructions  Complete by:  As directed   Diet recommendations: Dysphagia 3 (mechanical soft);Honey-thick liquid (ice chips) Liquids provided via: Cup;No straw Medication Administration: Whole meds with puree (if large and not contraindicated - crush) Supervision: Trained caregiver to feed patient;Full supervision/cueing for compensatory strategies (caregiver independently helps pt) Compensations: Slow rate;Small sips/bites (clear throat if voice gurgly) Postural Changes and/or Swallow Maneuvers: Seated upright 90 degrees;Upright 30-60 min after meal  It is important that you read following instructions as well as go over your medication list with RN to help you understand your care after this hospitalization.  Discharge Instructions: Please follow up with oncologist as needed.  Please follow-up with PCP in one week  Please request your primary  care physician to go over all Hospital Tests and Procedure/Radiological results at the follow up,  Please get all Hospital records sent to your PCP by signing hospital release before you go home.   Do not take more than prescribed Pain, Sleep and Anxiety Medications. You were cared for by a hospitalist during your hospital stay. If you have any questions about your discharge medications or the care you received while you were in the hospital after you are discharged, you can call the unit and ask to speak with the hospitalist on call if the hospitalist that took care of you is not available.  Once you are discharged, your primary care physician will handle any further medical issues. Please note that NO REFILLS for any discharge medications will be authorized once you are discharged, as it is imperative that you return to your primary care physician (or establish a relationship with a primary care physician if you do not have one) for your aftercare needs so that they can reassess your need for medications and monitor your lab values. You Must read complete instructions/literature along with all the possible adverse reactions/side effects for all the Medicines you take and that have been prescribed to you. Take any new Medicines after you have completely understood and accept all the possible adverse reactions/side effects. Wear Seat belts while driving.     Increase activity slowly    Complete by:  As directed           Discharge Exam: Filed Weights   05/04/15 2015 05/10/15 0430  Weight: 64.501 kg (142 lb 3.2 oz) 61.372 kg (135 lb 4.8 oz)   Filed Vitals:   05/10/15 0430 05/10/15 0456  BP: 187/72 169/70  Pulse: 116 112  Temp: 98.6 F (37 C)   Resp: 18    General: Appear in no distress, no Rash; Oral Mucosa moist. Cardiovascular: S1 and S2 Present, no Murmur, no JVD Respiratory: Bilateral Air entry present and Clear to Auscultation, no Crackles, no wheezes Abdomen: Bowel Sound present,  Soft and no tenderness Extremities: no Pedal edema, no calf tenderness Neurology: Grossly no focal neuro deficit.  The results of significant diagnostics from this hospitalization (including imaging, microbiology, ancillary and laboratory) are listed below for reference.    Significant Diagnostic Studies: Dg Chest 2 View  05/04/2015  CLINICAL DATA:  Tripped over object and fell at home today. Right-sided chest pain. Shortness of breath. COPD. EXAM: CHEST  2 VIEW COMPARISON:  04/27/2015 FINDINGS: Heart size remains within normal limits. Bibasilar pulmonary infiltrates, left side greater than right, show no significant change. No evidence of pneumothorax or pleural effusion. IMPRESSION: Bibasilar pulmonary infiltrates, without significant interval change. Electronically Signed   By: Earle Gell M.D.   On: 05/04/2015 16:34   Dg Pelvis 1-2 Views  05/04/2015  CLINICAL DATA:  Fall.  Pelvic pain.  Initial encounter. EXAM: PELVIS - 1-2 VIEW COMPARISON:  None. FINDINGS: Residual contrast material is seen within the rectosigmoid colon. This partially obscures visualization of the pelvis, however no definite pelvic fracture or other focal bone lesion identified. No evidence of pelvic joint diastasis. IMPRESSION: No pelvic fracture identified. Visualization limited by residual contrast material in rectosigmoid colon. Electronically Signed   By: Earle Gell M.D.   On: 05/04/2015 16:41   Ct Head Wo Contrast  05/04/2015  CLINICAL DATA:  Altered mental status and increase weakness over past 2 days. Metastatic thyroid cancer to brain. Previous craniotomy and radiation therapy. EXAM: CT HEAD WITHOUT CONTRAST TECHNIQUE: Contiguous axial images were obtained from the base of the skull through the vertex without intravenous contrast. COMPARISON:  04/18/2015 FINDINGS: There is no evidence of intracranial hemorrhage, brain edema, or other signs of acute infarction. No abnormal extraaxial fluid collections are identified. A 7  mm low-attenuation lesion in the left cerebellum remains stable, consistent with treated metastasis. No other intra-axial mass identified. No evidence of mass effect. Mild left parietal encephalomalacia remains stable with adjacent left craniotomy defect. Mild chronic small vessel disease again noted. IMPRESSION: Stable exam.  No acute intracranial findings. Stable 7 mm low-attenuation lesion in left cerebellum, consistent with treated metastasis. Electronically Signed   By: Earle Gell M.D.   On: 05/04/2015 16:55   Ct Head W Wo Contrast  04/19/2015  CLINICAL DATA:  Altered mental status, history of brain metastasis. Status post LEFT frontal craniotomy. History of metastatic thyroid cancer, borderline diabetes, hypertension. EXAM: CT HEAD WITHOUT AND WITH CONTRAST TECHNIQUE: Contiguous axial images were obtained from the base of the skull through the vertex without and with intravenous contrast CONTRAST:  151m OMNIPAQUE IOHEXOL 300 MG/ML  SOLN COMPARISON:  CT head March 15, 2015 and MRI of the head January 23, 2015 FINDINGS: No enhancing intracranial masses. 7 mm hypodensity in LEFT cerebellum likely represents patient's known metastasis, 15 mm previously. LEFT frontal encephalomalacia was present on prior imaging. No midline shift or mass effect. Ventricles and sulci are normal for age. No abnormal extra-axial fluid collections or extra-axial enhancement. Mild calcific atherosclerosis of the carotid siphons. Old LEFT frontal craniotomy. No skull fracture. Paranasal sinuses and mastoid air cells are well aerated. Ocular globes and orbital contents are normal. Status post RIGHT ocular lens implant. IMPRESSION: No acute intracranial process. No enhancing intracranial masses though, MRI of the brain with contrast would be more sensitive for assessment of residual or new metastasis. 7 mm hypodensity LEFT cerebellum likely representing interval response to treatment of known metastasis. LEFT frontal craniotomy and  subjacent encephalomalacia. Electronically Signed   By: CElon AlasM.D.   On: 04/19/2015 00:42   Ct Angio Chest Pe W/cm &/or Wo Cm  04/28/2015  CLINICAL DATA:  Two week history progressively worsening with weakness, cough, and shortness of breath. History of thyroid carcinoma and metastatic adenocarcinoma to brain [place] [facility] [place] [facility]GIOGRAPHY CHEST WITH CONTRAST TECHNIQUE: Multidetector CT imaging of the chest was performed using the standard protocol during bolus administration of intravenous contrast. Multiplanar CT image reconstructions and MIPs were obtained to evaluate the vascular anatomy. CONTRAST:  1040mOMNIPAQUE IOHEXOL 350 MG/ML SOLN COMPARISON:  03/15/2015 FINDINGS: Technically adequate study with good opacification of the central and segmental pulmonary arteries. No focal filling defects demonstrated. No evidence of significant pulmonary embolus. Normal heart size. Small pericardial effusion. Normal caliber thoracic aorta without aneurysm or  dissection. Aortic calcification. Great vessel origins are patent. Surgical absence of the thyroid gland. Esophagus is decompressed. No significant hilar, mediastinal, or axillary lymphadenopathy. Mild prominence of retrocrural lymph nodes, measuring up to about 9 mm short axis dimension. These are nonspecific and could represent reactive or metastatic nodes. Retrocrural nodes are slightly more prominent on previous study. Emphysematous changes in the lungs. Diffuse bronchial thickening and basilar mucous plugging suggesting chronic bronchitis. Patchy airspace infiltrates in the lung bases with more diffuse tree-in-bud infiltrates throughout the lower lungs. Scattered tiny pulmonary nodules. Changes are likely to represent inflammatory process such as bronchopneumonia. Given the history, atypical infection or interstitial metastatic disease are not entirely excluded and follow-up is suggested. Appearances have progressed  since the previous study. No pleural effusions. No pneumothorax. Included portions of the upper abdominal organs demonstrate diffuse fatty infiltration of the liver. Degenerative changes in the spine. No destructive bone lesions. Review of the MIP images confirms the above findings. IMPRESSION: No evidence of significant pulmonary embolus. Patchy airspace disease in the lung bases with diffuse nodular tree-in-bud pattern throughout the lungs likely representing endobronchial bacterial infection, although atypical infection (fungal or atypical mycobacterial) or interstitial metastatic disease not entirely excluded. Findings are progressing since previous study. Chronic bronchitic changes and emphysematous changes in the lungs. Small pericardial effusion. Diffuse fatty infiltration of the liver. Electronically Signed   By: Lucienne Capers M.D.   On: 04/28/2015 19:06   Mr [name] KG Contrast  05/05/2015  CLINICAL DATA:  Increased weakness over the past 2 days with altered mental status. Thyroid cancer with brain metastases. Prior craniotomy and radiation therapy. Whole-brain radiation 02/06/2015-02/26/2015. EXAM: MRI HEAD WITHOUT AND WITH CONTRAST TECHNIQUE: Multiplanar, multiecho pulse sequences of the brain and surrounding structures were obtained without and with intravenous contrast. CONTRAST:  17m MULTIHANCE GADOBENATE DIMEGLUMINE 529 MG/ML IV SOLN COMPARISON:  Head CT 05/04/2015 and MRI 01/23/2015 FINDINGS: The examination is mildly to moderately motion degraded despite using faster, more motion resistant imaging protocols and repeating some sequences. A partially empty sella is noted. There is no evidence of acute infarct, midline shift, or extra-axial fluid collection. Mild generalized cerebral atrophy is noted. Periventricular white matter T2 hyperintensities are nonspecific but compatible with mild chronic small vessel ischemic disease. Sequelae of left frontoparietal craniotomy are again identified with  mild underlying encephalomalacia, gliosis, and chronic blood products, unchanged from the prior MRI. Cerebellar metastases have all decreased in size, with the largest lesion located in the superior left cerebellar hemisphere and measuring 9 mm (series 11, image 18, previously 15 mm) with associated blood products again noted and at most minimal residual edema. Left parietal lesion is now only faintly visible, measuring 1.5 mm (series 11, image 38, previously 8 mm). A left frontal lobe lesion is also now only faintly visible, measuring 3 x 1 mm (series 11, image 39, previously 9 x 5 mm). Other bilateral cerebral metastases on the prior study are no longer clearly identified. No new brain lesions are identified. Prior right cataract extraction is noted. Minimal paranasal sinus mucosal thickening, a small left maxillary sinus mucous retention cyst, and mild right and moderately large left mastoid effusions are noted. Major intracranial vascular flow voids are unchanged, with the left vertebral artery noted to be dominant. IMPRESSION: 1. Mildly to moderately motion degraded examination. No acute intracranial abnormality. 2. Decreased size of all brain metastases, with some no longer visible. 3. No new metastases identified within limitations of motion artifact. Electronically Signed   By: ALogan Bores  M.D.   On: 05/05/2015 11:42   Dg Chest Portable 1 View  04/27/2015  CLINICAL DATA:  Shortness of breath and weakness today. Patient diagnosed with bilateral pneumonia 04/25/2015. EXAM: PORTABLE CHEST 1 VIEW COMPARISON:  04/18/2015 FINDINGS: Cardiomediastinal silhouette is normal. Mediastinal contours appear intact. There is no evidence of pleural effusion or pneumothorax. Bilateral lower lobe linear and patchy airspace consolidation has slightly worsened. Osseous structures are without acute abnormality. Soft tissues are grossly normal. IMPRESSION: Bilateral lower lobe patchy and peribronchovascular airspace  consolidation, likely representing multifocal pneumonia. Electronically Signed   By: Fidela Salisbury M.D.   On: 04/27/2015 13:54   Dg Chest Portable 1 View  04/18/2015  CLINICAL DATA:  Fever and shortness of breath for 4 days. Pneumonia. Currently undergoing chemotherapy for brain cancer. EXAM: PORTABLE CHEST 1 VIEW COMPARISON:  04/02/2015 FINDINGS: Mild worsening of bibasilar airspace disease is seen since prior study. Mild cardiomegaly stable. No evidence of pneumothorax or pleural effusion. IMPRESSION: Mild worsening of bibasilar airspace disease, suspicious for pneumonia. Electronically Signed   By: Earle Gell M.D.   On: 04/18/2015 21:08   Dg Swallowing Func-speech Pathology  04/29/2015  Objective Swallowing Evaluation: Type of Study: MBS-Modified Barium Swallow Study Patient Details Name: Ruth Kovich MRN: [id number] Date of Birth: 1955-10-21 Today's Date: 04/29/2015 Time: SLP Start Time (ACUTE ONLY): 1215-SLP Stop Time (ACUTE ONLY): 1240 SLP Time Calculation (min) (ACUTE ONLY): 25 min Past Medical History: Past Medical History Diagnosis Date . Essential hypertension  . Anxiety  . Depression  . COPD (chronic obstructive pulmonary disease) ([place])  . GERD (gastroesophageal reflux disease)  . Headache(784.0)  . Arthritis  . Hx of adenomatous colonic polyps  . Emphysema  . Cataract    1 lens implaqnt right eye,intact on left eye . Goiter  . Fibromyalgia  . Pulmonary emboli ([place]) 11/04/11   Right upper lobe and r lower lobe PE . Lung nodule seen on imaging study 11/04/11 CT   42m LLL . DVT (deep venous thrombosis) ([facility] 12/09/2011 . [place]monary embolism ([facility] 12/09/2011 . Hyperlipidemia  . History of radiation therapy 10/06/11   SRS 15Gy 145f brain . UTI (lower urinary tract infection)  . Borderline diabetes mellitus  . Metastatic adenocarcinoma to brain ([facility]   Left frontoparietal region . Thyroid cancer ([facility] . S/P radiation therapy 05/31/14 SRS   Brain . PONV (postoperative nausea and vomiting)  .  Controlled diabetes mellitus type II without complication ([id number] Previously diagnosed with borderline diabetes. . Marland Kitchenerpes simplex esophagitis 03/11/2015   Also candidal esophagitis . HCAP (healthcare-associated pneumonia) 03/03/2015 . Mucositis due to radiation therapy 03/03/2015 Past Surgical History: Past Surgical History Procedure Laterality Date . Abdominal hysterectomy  1996 . Cholecystectomy  2000 . Shoulder surgery Right 1998 . Eye surgery  2001 . Foot surgery Left 2005 . Craniotomy  09/15/2011   Procedure: CRANIOTOMY TUMOR EXCISION;  Surgeon: RoHosie SpangleMD;  [facility];  Service: Neurosurgery;  Laterality: N/A;  Craniotomy resection of tumor with stealth . Cataract extraction  2011   With lens implant . Total thyroidectomy  10-29-2013   [facility]   . Video bronchoscopy with endobronchial ultrasound N/A 04/11/2014   Procedure: VIDEO BRONCHOSCOPY WITH ENDOBRONCHIAL ULTRASOUND;  Surgeon: StMelrose NakayamaMD;  [facility] Service: Thoracic;  Laterality: N/A; . Esophagogastroduodenoscopy (egd) with propofol N/A 03/07/2015   Procedure: ESOPHAGOGASTRODUODENOSCOPY (EGD) WITH PROPOFOL;  Surgeon: RoDaneil DolinMD;  [facility];  Service: Endoscopy;  Laterality: N/A; . Esophageal biopsy  03/07/2015   Procedure: BIOPSY;  Surgeon: Daneil Dolin, MD;  [facility];  Service: Endoscopy;; HPI: 60 year old female with history of thyroid cancer, metastatic adenocarcinoma to brain, who is currently undergoing chemotherapy at [place] continues with increased coughing/weakness.  Pt recently treated for pna and released home but returned due to ongoing symptoms.   Patient had chemotherapy and radiation in November 2016 at that time she was treated for shingles and pneumonia per chart review.  CT scan still showed pneumonia early January.  Spouse and pt report plans for repeat testing to determine if pt needs more  chemo and radiation.  Pt has h/o esophagitis/mucositis and thrush that contribute to her dysphagia.  SLP asked to evaluate swallow. Subjective: none stated Assessment / Plan / Recommendation CHL IP CLINICAL IMPRESSIONS 04/29/2015 Therapy Diagnosis Moderate oral phase dysphagia;Mild oral phase dysphagia;Mild pharyngeal phase dysphagia Clinical Impression Mild=moderate oropharyngeal dysphagia WITHOUT aspiration of any consistency tested.  Pt noted to cough several times during MBS without barium visualized in trachea/larynx.  Delayed oral transiting with lingual pumping noted with mild tongue base residuals without pt awareness.  Pharyngeal swallow characterized by decreased tongue base retraction resulting in mild vallecular/tongue base residual.  Liquid washes aid clearance of pudding/cracker and CUED dry swallows aid clearance of pharynx.  Recommend advance diet to allow thin liquids with strict aspiration precautions.  Using teach back, live video, educated pt and spouse to findings/recommendations.  SLP to follow up for dysphagia management.  Impact on safety and function Moderate aspiration risk   CHL IP TREATMENT RECOMMENDATION 04/29/2015 Treatment Recommendations Therapy as outlined in treatment plan below   Prognosis 04/29/2015 Prognosis for Safe Diet Advancement Guarded Barriers to Reach Goals Cognitive deficits;Severity of deficits Barriers/Prognosis Comment -- CHL IP DIET RECOMMENDATION 04/29/2015 SLP Diet Recommendations Regular solids;Thin liquid Liquid Administration via -- Medication Administration Crushed with puree Compensations Slow rate;Small sips/bites;Multiple dry swallows after each bite/sip;Clear throat intermittently;Follow solids with liquid Postural Changes Seated upright at 90 degrees;Remain semi-upright after after feeds/meals (Comment)   CHL IP OTHER RECOMMENDATIONS 04/29/2015 Recommended Consults -- Oral Care Recommendations Oral care BID Other Recommendations Have oral suction  available;Clarify dietary restrictions   CHL IP FOLLOW UP RECOMMENDATIONS 04/29/2015 Follow up Recommendations 24 hour supervision/assistance   CHL IP FREQUENCY AND DURATION 04/29/2015 Speech Therapy Frequency (ACUTE ONLY) min 2x/week Treatment Duration 1 week      CHL IP ORAL PHASE 04/29/2015 Oral Phase Impaired Oral - Pudding Teaspoon -- Oral - Pudding Cup -- Oral - Honey Teaspoon -- Oral - Honey Cup -- Oral - Nectar Teaspoon Weak lingual manipulation;Lingual pumping;Reduced posterior propulsion;Delayed oral transit Oral - Nectar Cup Weak lingual manipulation;Reduced posterior propulsion;Piecemeal swallowing;Delayed oral transit Oral - Nectar Straw -- Oral - Thin Teaspoon Delayed oral transit;Weak lingual manipulation;Reduced posterior propulsion;Lingual/palatal residue;Lingual pumping Oral - Thin Cup Lingual pumping;Weak lingual manipulation;Delayed oral transit;Reduced posterior propulsion;Piecemeal swallowing Oral - Thin Straw -- Oral - Puree Lingual pumping;Weak lingual manipulation;Reduced posterior propulsion Oral - Mech Soft Lingual pumping;Weak lingual manipulation;Reduced posterior propulsion;Impaired mastication Oral - Regular -- Oral - Multi-Consistency -- Oral - Pill Lingual pumping;Other (Comment) Oral Phase - Comment pt extended head upward with nectar, ? to aid oral transiting, tongue base residuals without awareness   CHL IP PHARYNGEAL PHASE 04/29/2015 Pharyngeal Phase Impaired Pharyngeal- Pudding Teaspoon -- Pharyngeal -- Pharyngeal- Pudding Cup -- Pharyngeal -- Pharyngeal- Honey Teaspoon -- Pharyngeal -- Pharyngeal- Honey Cup -- Pharyngeal -- Pharyngeal- Nectar Teaspoon  Reduced tongue base retraction Pharyngeal -- Pharyngeal- Nectar Cup Reduced tongue base retraction;Delayed swallow initiation-pyriform sinuses Pharyngeal -- Pharyngeal- Nectar Straw -- Pharyngeal -- Pharyngeal- Thin Teaspoon Reduced tongue base retraction Pharyngeal -- Pharyngeal- Thin Cup Reduced tongue base retraction;Delayed  swallow initiation-pyriform sinuses;Penetration/Aspiration during swallow Pharyngeal Material enters airway, remains ABOVE vocal cords then ejected out Pharyngeal- Thin Straw Reduced tongue base retraction;Pharyngeal residue - valleculae Pharyngeal -- Pharyngeal- Puree Reduced tongue base retraction;Pharyngeal residue - valleculae Pharyngeal -- Pharyngeal- Mechanical Soft Reduced tongue base retraction;Pharyngeal residue - valleculae Pharyngeal -- Pharyngeal- Regular -- Pharyngeal -- Pharyngeal- Multi-consistency -- Pharyngeal -- Pharyngeal- Pill Pharyngeal residue - valleculae Pharyngeal -- Pharyngeal Comment pt with mild tongue base/vallecular residuals without awareness, following solids with liquids. dry swallows cued helpful   CHL IP CERVICAL ESOPHAGEAL PHASE 04/29/2015 Cervical Esophageal Phase Impaired Pudding Teaspoon -- Pudding Cup -- Honey Teaspoon -- Honey Cup -- Nectar Teaspoon -- Nectar Cup -- Nectar Straw -- Thin Teaspoon -- Thin Cup -- Thin Straw -- Puree -- Mechanical Soft -- Regular -- Multi-consistency -- Pill -- Cervical Esophageal Comment appearance of slow clearance of pudding below UES without backflow; liquids appeared to facilitate clearance  upon esophageal sweep - pt appeared with coating of barium throughout the esophagus- ? if due to esophagitis/candidiasis  No flowsheet data found. Janett Labella [place], [place] [facility] SLP [phone]               Microbiology: No results found for this or any previous visit (from the past 240 hour(s)).   Labs: CBC:  Recent Labs Lab 05/06/15 0525  WBC 2.7*  HGB 9.8*  HCT 31.6*  MCV 97.2  PLT 628   Basic Metabolic Panel:  Recent Labs Lab 05/06/15 0525 05/09/15 0504 05/10/15 0532  NA 145 139 139  K 4.3 3.9 3.8  CL 113* 110 110  CO2 26 22 19*  GLUCOSE 86 112* 94  BUN <5* <5* <5*  CREATININE 0.35* 0.32* 0.35*  CALCIUM 8.7* 8.5* 8.5*   BNP (last 3 results)  Recent Labs  03/15/15 2100 04/18/15 2031  BNP 81.0 42.0    CBG:  Recent Labs Lab 05/05/15 1224 05/05/15 1652 05/06/15 0732 05/08/15 0802 05/09/15 0724  GLUCAP 78 87 62* 114* 92   Time spent: 30 minutes  Signed:  Eashan Schipani  [facility] 05/10/2015, 7:04 AM

## 2015-05-15 ENCOUNTER — Other Ambulatory Visit (HOSPITAL_COMMUNITY): Payer: Self-pay | Admitting: Physician Assistant

## 2015-05-15 ENCOUNTER — Ambulatory Visit (HOSPITAL_COMMUNITY)
Admission: RE | Admit: 2015-05-15 | Discharge: 2015-05-15 | Disposition: A | Discharge status: Home / Self Care | Payer: Medicare Other | Source: Ambulatory Visit | Attending: Physician Assistant | Admitting: Physician Assistant

## 2015-05-15 DIAGNOSIS — J189 Pneumonia, unspecified organism: Secondary | ICD-10-CM

## 2015-06-05 ENCOUNTER — Ambulatory Visit (HOSPITAL_COMMUNITY)
Admission: RE | Admit: 2015-06-05 | Discharge: 2015-06-05 | Disposition: A | Discharge status: Home / Self Care | Payer: Medicare Other | Source: Ambulatory Visit | Attending: Internal Medicine | Admitting: Internal Medicine

## 2015-06-05 ENCOUNTER — Other Ambulatory Visit (HOSPITAL_COMMUNITY): Payer: Self-pay | Admitting: Internal Medicine

## 2015-06-05 DIAGNOSIS — Z09 Encounter for follow-up examination after completed treatment for conditions other than malignant neoplasm: Secondary | ICD-10-CM | POA: Diagnosis not present

## 2015-06-05 DIAGNOSIS — J189 Pneumonia, unspecified organism: Secondary | ICD-10-CM | POA: Diagnosis present

## 2015-06-11 ENCOUNTER — Ambulatory Visit (HOSPITAL_COMMUNITY)
Admission: RE | Admit: 2015-06-11 | Discharge: 2015-06-11 | Disposition: A | Discharge status: Home / Self Care | Payer: Medicare Other | Source: Ambulatory Visit | Attending: Family Medicine | Admitting: Family Medicine

## 2015-06-11 ENCOUNTER — Other Ambulatory Visit (HOSPITAL_COMMUNITY): Payer: Self-pay | Admitting: Family Medicine

## 2015-06-11 DIAGNOSIS — J189 Pneumonia, unspecified organism: Secondary | ICD-10-CM | POA: Insufficient documentation

## 2015-06-18 ENCOUNTER — Emergency Department (HOSPITAL_COMMUNITY): Payer: Medicare Other

## 2015-06-18 ENCOUNTER — Emergency Department (HOSPITAL_COMMUNITY)
Admission: EM | Admit: 2015-06-18 | Discharge: 2015-06-18 | Disposition: A | Discharge status: Home / Self Care | Payer: Medicare Other | Attending: Emergency Medicine | Admitting: Emergency Medicine

## 2015-06-18 ENCOUNTER — Encounter (HOSPITAL_COMMUNITY): Payer: Self-pay | Admitting: Emergency Medicine

## 2015-06-18 DIAGNOSIS — M1990 Unspecified osteoarthritis, unspecified site: Secondary | ICD-10-CM | POA: Insufficient documentation

## 2015-06-18 DIAGNOSIS — F329 Major depressive disorder, single episode, unspecified: Secondary | ICD-10-CM | POA: Insufficient documentation

## 2015-06-18 DIAGNOSIS — S0001XA Abrasion of scalp, initial encounter: Secondary | ICD-10-CM | POA: Diagnosis not present

## 2015-06-18 DIAGNOSIS — Y939 Activity, unspecified: Secondary | ICD-10-CM | POA: Diagnosis not present

## 2015-06-18 DIAGNOSIS — S0232XA Fracture of orbital floor, left side, initial encounter for closed fracture: Secondary | ICD-10-CM | POA: Diagnosis not present

## 2015-06-18 DIAGNOSIS — S0592XA Unspecified injury of left eye and orbit, initial encounter: Secondary | ICD-10-CM | POA: Diagnosis present

## 2015-06-18 DIAGNOSIS — Z87891 Personal history of nicotine dependence: Secondary | ICD-10-CM | POA: Diagnosis not present

## 2015-06-18 DIAGNOSIS — Y92009 Unspecified place in unspecified non-institutional (private) residence as the place of occurrence of the external cause: Secondary | ICD-10-CM | POA: Diagnosis not present

## 2015-06-18 DIAGNOSIS — E785 Hyperlipidemia, unspecified: Secondary | ICD-10-CM | POA: Diagnosis not present

## 2015-06-18 DIAGNOSIS — E119 Type 2 diabetes mellitus without complications: Secondary | ICD-10-CM | POA: Insufficient documentation

## 2015-06-18 DIAGNOSIS — Z79899 Other long term (current) drug therapy: Secondary | ICD-10-CM | POA: Insufficient documentation

## 2015-06-18 DIAGNOSIS — Z85850 Personal history of malignant neoplasm of thyroid: Secondary | ICD-10-CM | POA: Insufficient documentation

## 2015-06-18 DIAGNOSIS — W01198A Fall on same level from slipping, tripping and stumbling with subsequent striking against other object, initial encounter: Secondary | ICD-10-CM | POA: Diagnosis not present

## 2015-06-18 DIAGNOSIS — Y999 Unspecified external cause status: Secondary | ICD-10-CM | POA: Diagnosis not present

## 2015-06-18 DIAGNOSIS — J449 Chronic obstructive pulmonary disease, unspecified: Secondary | ICD-10-CM | POA: Insufficient documentation

## 2015-06-18 DIAGNOSIS — I10 Essential (primary) hypertension: Secondary | ICD-10-CM | POA: Insufficient documentation

## 2015-06-18 DIAGNOSIS — W19XXXA Unspecified fall, initial encounter: Secondary | ICD-10-CM

## 2015-06-18 NOTE — ED Notes (Signed)
Pt fell this morning at home with loc and husband took her to pcp today. Tonight pt sneezed and her left eye started to swell.

## 2015-06-18 NOTE — ED Provider Notes (Signed)
CSN: [id number]     Arrival date & time 06/18/15  1955 History  By signing my name below, I, Doran Stabler, attest that this documentation has been prepared under the direction and in the presence of Nat Christen, MD. Electronically Signed: Doran Stabler, ED Scribe. 06/18/2015. 9:14 PM.    Chief Complaint  Patient presents with  . Fall   The history is provided by a relative and the patient. No language interpreter was used.   HPI Comments: Ruben Pyka is a 60 y.o. female who presents to the Emergency Department with a PMHx of thyroid cancer for an evaluation s/p fall at 10 am, 11 hours ago, today. Daughter reports the patient fell twice today and during one of those falls, the pt hit her eye on a cabinet. Since then, the pt has been having left eye pain. She also reports the pt "blew her nose and the began having swelling" under her left eye. Pt was seen by Dr. Gerarda Fraction MD , PCP For evaluation. Pt denies any other symptoms at this time. Pt has been on abx for the past 6 days to treat a reoccurring23 episode of pneumonia.   Daughter also reports blood in sputum.  Past Medical History  Diagnosis Date  . Essential hypertension   . Anxiety   . Depression   . COPD (chronic obstructive pulmonary disease) ([place])   . GERD (gastroesophageal reflux disease)   . Headache(784.0)   . Arthritis   . Hx of adenomatous colonic polyps   . Emphysema   . Cataract     1 lens implaqnt right eye,intact on left eye  . Goiter   . Fibromyalgia   . Pulmonary emboli ([place]) 11/04/11    Right upper lobe and r lower lobe PE  . Lung nodule seen on imaging study 11/04/11 CT    67m LLL  . DVT [place]deep venous thrombosis) ([place] 12/09/2011  . [place]monary embolism ([place] 12/09/2011  . Hyperlipidemia   . History of radiation therapy 10/06/11    SRS 15Gy 129f brain  . UTI (lower urinary tract infection)   . Borderline diabetes mellitus   . Metastatic adenocarcinoma to brain ([facility]/12/13    Left frontoparietal region  . Thyroid  cancer ([facility]  . S/P radiation therapy 05/31/14 SRS    Brain  . PONV (postoperative nausea and vomiting)   . Controlled diabetes mellitus type II without complication ([id number]  Previously diagnosed with borderline diabetes.  . Marland Kitchenerpes simplex esophagitis 03/11/2015    Also candidal esophagitis  . HCAP (healthcare-associated pneumonia) 03/03/2015  . Mucositis due to radiation therapy 03/03/2015   Past Surgical History  Procedure Laterality Date  . Abdominal hysterectomy  1996  . Cholecystectomy  2000  . Shoulder surgery Right 1998  . Eye surgery  2001  . Foot surgery Left 2005  . Craniotomy  09/15/2011    Procedure: CRANIOTOMY TUMOR EXCISION;  Surgeon: RoHosie SpangleMD;  [facility];  Service: Neurosurgery;  Laterality: N/A;  Craniotomy resection of tumor with stealth  . Cataract extraction  2011    With lens implant  . [place]  [place]29-2013    [place]. Lymph gland excision    . Video bronchoscopy with endobronchial ultrasound N/A 04/11/2014    Procedure: VIDEO BRONCHOSCOPY WITH ENDOBRONCHIAL ULTRASOUND;  Surgeon: StMelrose NakayamaMD;  [facility] Service: Thoracic;  Laterality: N/A;  . Esophagogastroduodenoscopy (egd) with propofol N/A 03/07/2015  Procedure: ESOPHAGOGASTRODUODENOSCOPY (EGD) WITH PROPOFOL;  Surgeon: Daneil Dolin, MD;  Location[facility];  Service: Endoscopy;  Laterality: N/A;  . Biopsy  03/07/2015    Procedure: BIOPSY;  Surgeon: Daneil Dolin, MD;  [facility];  Service: Endoscopy;;   Family History  Problem Relation Age of Onset  . Asthma Mother   . Kidney failure Father   . Diabetes Sister   . Heart attack Sister   . Colon cancer Brother   . COPD Sister   . Aneurysm Paternal Grandmother     Brain  . Parkinsonism Maternal Uncle   . COPD Brother    Social History  Substance Use Topics  . Smoking status: Former Smoker -- 1.00 packs/day for 40 years    Types:  Cigarettes    Quit date: 04/06/2011  . Smokeless tobacco: Never Used  . Alcohol Use: No   OB History    No data available     Review of Systems A complete 10 system review of systems was obtained and all systems are negative except as noted in the HPI and PMH.   Allergies  Penicillins; Other; Codeine; Potassium-containing compounds; and Tape  Home Medications   Prior to Admission medications   Medication Sig Start Date End Date Taking? Authorizing Provider  acetaminophen (TYLENOL) 325 MG tablet Take 2 tablets (650 mg total) by mouth every 6 (six) hours as needed for mild pain. 05/10/15  Yes Lavina Hamman, MD  albuterol (PROVENTIL) (2.5 MG/3ML) 0.083% nebulizer solution Take 3 mLs (2.5 mg total) by nebulization every 4 (four) hours as needed for wheezing or shortness of breath. 04/25/15  Yes Orvan Falconer, MD  cefpodoxime (VANTIN) 200 MG tablet Take 1 tablet by mouth 2 (two) times daily. 06/11/15  Yes Historical Provider, MD  levothyroxine (SYNTHROID, LEVOTHROID) 175 MCG tablet Take 175 mcg by mouth daily before breakfast.   Yes Historical Provider, MD  magic mouthwash w/lidocaine SOLN Take 5 mLs by mouth 4 (four) times daily as needed for mouth pain. 03/13/15  Yes Velvet Bathe, MD  megestrol (MEGACE) 400 MG/10ML suspension Take 10 mLs (400 mg total) by mouth daily. 05/10/15  Yes Lavina Hamman, MD  nystatin (MYCOSTATIN) 100000 UNIT/ML suspension Use as directed 5 mLs (500,000 Units total) in the mouth or throat 4 (four) times daily. Take for 1 week then stop. Patient taking differently: Use as directed 5 mLs in the mouth or throat 4 (four) times daily as needed (for thrush). Take for 1 week then stop. 05/02/15  Yes Eugenie Filler, MD  ondansetron (ZOFRAN ODT) 8 MG disintegrating tablet Take 1 tablet (8 mg total) by mouth every 8 (eight) hours as needed for nausea or vomiting. 04/05/14  Yes Pattricia Boss, MD  Oxycodone HCl 20 MG TABS Take 1 tablet by mouth every 4 (four) hours as needed (pain).   Yes  Historical Provider, MD  pantoprazole (PROTONIX) 40 MG tablet Take 1 tablet (40 mg total) by mouth 2 (two) times daily. 03/13/15  Yes Velvet Bathe, MD  polyethylene glycol (MIRALAX / GLYCOLAX) packet Take 17 g by mouth daily as needed for moderate constipation. Reported on 04/27/2015   Yes Historical Provider, MD  potassium chloride SA (K-DUR,KLOR-CON) 20 MEQ tablet Take 40 mEq by mouth 2 (two) times daily. Dissolve in water and take either in applesauce or drink the water.   Yes Historical Provider, MD  amitriptyline (ELAVIL) 25 MG tablet Take 1 tablet (25 mg total) by mouth at bedtime. Patient not  taking: Reported on 06/18/2015 05/10/15   Lavina Hamman, MD  benzonatate (TESSALON) 100 MG capsule Take 1 capsule (100 mg total) by mouth 3 (three) times daily as needed for cough. Patient not taking: Reported on 06/18/2015 05/02/15   Eugenie Filler, MD  clotrimazole (GYNE-LOTRIMIN) 1 % vaginal cream Place 1 Applicatorful vaginally at bedtime. Patient not taking: Reported on 06/18/2015 05/02/15   Eugenie Filler, MD  feeding supplement, ENSURE ENLIVE, (ENSURE ENLIVE) LIQD Take 237 mLs by mouth 2 (two) times daily between meals. Patient not taking: Reported on 06/18/2015 05/10/15   Lavina Hamman, MD  sucralfate (CARAFATE) 1 G tablet Take 1 tablet (1 g total) by mouth 4 (four) times daily -  with meals and at bedtime. Patient not taking: Reported on 06/18/2015 03/13/15   Velvet Bathe, MD  valACYclovir (VALTREX) 1000 MG tablet Take 1 tablet (1,000 mg total) by mouth daily. Patient not taking: Reported on 06/18/2015 04/25/15   Orvan Falconer, MD  Wound Dressings [facility]) Apply 1 application topically daily.    Historical Provider, MD   BP 148/82 mmHg  Pulse 96  Temp(Src) 99.3 F (37.4 C)  Resp 18  Ht '5\' 4"'$  (1.626 m)  Wt 135 lb (61.236 kg)  BMI 23.16 kg/m2  SpO2 98%   Physical Exam  Constitutional: She is oriented to person, place, and time. She appears well-developed and well-nourished.  HENT:  Head:  Normocephalic and atraumatic.  3 2 cm abrasions on left parietal scalp; swelling noted inferior left eye.   Eyes: Conjunctivae and EOM are normal. Pupils are equal, round, and reactive to light.  Neck: Normal range of motion. Neck supple.  Cardiovascular: Normal rate and regular rhythm.   Pulmonary/Chest: Effort normal and breath sounds normal.  Abdominal: Soft. Bowel sounds are normal.  Musculoskeletal: Normal range of motion.  Neurological: She is alert and oriented to person, place, and time.  Skin: Skin is warm and dry.  Psychiatric: She has a normal mood and affect. Her behavior is normal.  Nursing note and vitals reviewed.   ED Course  Procedures   DIAGNOSTIC STUDIES: Oxygen Saturation is 100% on room air, normal by my interpretation.    COORDINATION OF CARE: 9:06 PM Discussed with pt and her family the presence of a left orbit floor fracture. Daughter is concerned for blood in sputum. Will order CXR. Discussed treatment plan with pt and family at bedside and they agreed to plan.  Labs Review Labs Reviewed - No data to display  Imaging Review Dg Chest 2 View  06/18/2015  CLINICAL DATA:  Fall earlier today, right eye swelling. Weakness, headache and dry cough. Known brain metastases, former smoker. EXAM: CHEST  2 VIEW COMPARISON:  Chest x-rays dated 06/11/2015 and 05/15/2015. FINDINGS: Mild cardiomegaly is stable. Overall cardiomediastinal silhouette is stable in size and configuration. Coarse interstitial lung markings are again seen bilaterally, most likely indicating chronic interstitial lung disease. No confluent airspace opacity to suggest a developing pneumonia. No pleural effusion. Osseous and soft tissue structures about the chest are unremarkable. IMPRESSION: No active cardiopulmonary disease. No evidence of pneumonia. Stable chronic findings detailed above. Electronically Signed   By: Franki Cabot M.D.   On: 06/18/2015 21:44   Ct Head Wo Contrast  06/18/2015  CLINICAL  DATA:  Fall this morning with loss of consciousness. History of thyroid cancer with brain metastases. Previous craniotomy and radiation therapy, per clinical data on previous MRI report. EXAM: CT HEAD WITHOUT CONTRAST CT MAXILLOFACIAL WITHOUT CONTRAST CT CERVICAL SPINE  WITHOUT CONTRAST TECHNIQUE: Multidetector CT imaging of the head, cervical spine, and maxillofacial structures were performed using the standard protocol without intravenous contrast. Multiplanar CT image reconstructions of the cervical spine and maxillofacial structures were also generated. COMPARISON:  Brain MRI dated 05/05/2015 and head CT dated 05/04/2015. FINDINGS: CT HEAD FINDINGS Mild generalized brain atrophy is again noted with commensurate dilatation of the ventricles and sulci. Ventricles are stable in size and configuration. Mild chronic small vessel ischemic changes again noted within the deep periventricular white matter regions bilaterally. Again noted are surgical changes of left frontoparietal craniotomy with mild underlying encephalomalacia. All other areas of the brain demonstrate normal gray-white matter attenuation. There is no mass, hemorrhage, edema or other evidence of acute parenchymal abnormality. The subtle intracranial metastases seen on earlier brain MRI are not able to be visualized on head CT. No extra-axial hemorrhage. Large amount of soft tissue air is seen about the anterior margin of the left orbit and extending into the retro-orbital space. Suspect associated orbital floor fracture (see below). There is associated left orbital proptosis. Left orbital globe appears otherwise grossly normal in position and configuration. CT MAXILLOFACIAL FINDINGS There is a slightly displaced/ depressed fracture of the left orbital floor, depressed approximately 4 mm. No associated extraocular muscle entrapment. Remainder of the osseous structures about the left orbit appear intact. Osseous structures about the right orbit appear  intact throughout. Lower frontal bones are intact. No displaced nasal bone fracture seen. Anterior and posterior walls of the bilateral maxillary sinuses are intact and well aligned. Bilateral zygoma and pterygoid plates are intact. No mandible fracture or displacement seen. CT CERVICAL SPINE FINDINGS Mild degenerative change noted within the lower cervical spine with associated mild disc space narrowings and mild osseous spurring. Straightening of the normal cervical lordosis likely related to these underlying degenerative changes. No fracture line or displaced fracture fragment identified. Facet joints appear normally aligned throughout. Atherosclerotic calcifications noted at each carotid bulb region. Visualized paravertebral soft tissues are otherwise unremarkable. Mild to moderate emphysematous change noted at each lung apex. IMPRESSION: 1. Slightly displaced/depressed fracture of the left orbital floor, depressed approximately 3-4 mm, with associated large amount of soft tissue air about the left orbit. Also with associated left orbital proptosis. 2. No other facial bone fracture or displacement seen. 3. No acute intracranial abnormality. No intracranial hemorrhage or edema. The small parenchymal metastases identified on previous brain MRI are not seen on today's head CT (likely below the resolution of CT given their small size on MRI). 4. No fracture or acute subluxation identified within the cervical spine. Mild degenerative change within the lower cervical spine. 5. Emphysematous changes at each lung apex, mild to moderate in degree. Electronically Signed   By: Franki Cabot M.D.   On: 06/18/2015 20:52   Ct Cervical Spine Wo Contrast  06/18/2015  CLINICAL DATA:  Fall this morning with loss of consciousness. History of thyroid cancer with brain metastases. Previous craniotomy and radiation therapy, per clinical data on previous MRI report. EXAM: CT HEAD WITHOUT CONTRAST CT MAXILLOFACIAL WITHOUT CONTRAST  CT CERVICAL SPINE WITHOUT CONTRAST TECHNIQUE: Multidetector CT imaging of the head, cervical spine, and maxillofacial structures were performed using the standard protocol without intravenous contrast. Multiplanar CT image reconstructions of the cervical spine and maxillofacial structures were also generated. COMPARISON:  Brain MRI dated 05/05/2015 and head CT dated 05/04/2015. FINDINGS: CT HEAD FINDINGS Mild generalized brain atrophy is again noted with commensurate dilatation of the ventricles and sulci. Ventricles are stable in size  and configuration. Mild chronic small vessel ischemic changes again noted within the deep periventricular white matter regions bilaterally. Again noted are surgical changes of left frontoparietal craniotomy with mild underlying encephalomalacia. All other areas of the brain demonstrate normal gray-white matter attenuation. There is no mass, hemorrhage, edema or other evidence of acute parenchymal abnormality. The subtle intracranial metastases seen on earlier brain MRI are not able to be visualized on head CT. No extra-axial hemorrhage. Large amount of soft tissue air is seen about the anterior margin of the left orbit and extending into the retro-orbital space. Suspect associated orbital floor fracture (see below). There is associated left orbital proptosis. Left orbital globe appears otherwise grossly normal in position and configuration. CT MAXILLOFACIAL FINDINGS There is a slightly displaced/ depressed fracture of the left orbital floor, depressed approximately 4 mm. No associated extraocular muscle entrapment. Remainder of the osseous structures about the left orbit appear intact. Osseous structures about the right orbit appear intact throughout. Lower frontal bones are intact. No displaced nasal bone fracture seen. Anterior and posterior walls of the bilateral maxillary sinuses are intact and well aligned. Bilateral zygoma and pterygoid plates are intact. No mandible fracture or  displacement seen. CT CERVICAL SPINE FINDINGS Mild degenerative change noted within the lower cervical spine with associated mild disc space narrowings and mild osseous spurring. Straightening of the normal cervical lordosis likely related to these underlying degenerative changes. No fracture line or displaced fracture fragment identified. Facet joints appear normally aligned throughout. Atherosclerotic calcifications noted at each carotid bulb region. Visualized paravertebral soft tissues are otherwise unremarkable. Mild to moderate emphysematous change noted at each lung apex. IMPRESSION: 1. Slightly displaced/depressed fracture of the left orbital floor, depressed approximately 3-4 mm, with associated large amount of soft tissue air about the left orbit. Also with associated left orbital proptosis. 2. No other facial bone fracture or displacement seen. 3. No acute intracranial abnormality. No intracranial hemorrhage or edema. The small parenchymal metastases identified on previous brain MRI are not seen on today's head CT (likely below the resolution of CT given their small size on MRI). 4. No fracture or acute subluxation identified within the cervical spine. Mild degenerative change within the lower cervical spine. 5. Emphysematous changes at each lung apex, mild to moderate in degree. Electronically Signed   By: Franki Cabot M.D.   On: 06/18/2015 20:52   Ct Maxillofacial Wo Cm  06/18/2015  CLINICAL DATA:  Fall this morning with loss of consciousness. History of thyroid cancer with brain metastases. Previous craniotomy and radiation therapy, per clinical data on previous MRI report. EXAM: CT HEAD WITHOUT CONTRAST CT MAXILLOFACIAL WITHOUT CONTRAST CT CERVICAL SPINE WITHOUT CONTRAST TECHNIQUE: Multidetector CT imaging of the head, cervical spine, and maxillofacial structures were performed using the standard protocol without intravenous contrast. Multiplanar CT image reconstructions of the cervical spine and  maxillofacial structures were also generated. COMPARISON:  Brain MRI dated 05/05/2015 and head CT dated 05/04/2015. FINDINGS: CT HEAD FINDINGS Mild generalized brain atrophy is again noted with commensurate dilatation of the ventricles and sulci. Ventricles are stable in size and configuration. Mild chronic small vessel ischemic changes again noted within the deep periventricular white matter regions bilaterally. Again noted are surgical changes of left frontoparietal craniotomy with mild underlying encephalomalacia. All other areas of the brain demonstrate normal gray-white matter attenuation. There is no mass, hemorrhage, edema or other evidence of acute parenchymal abnormality. The subtle intracranial metastases seen on earlier brain MRI are not able to be visualized on head CT.  No extra-axial hemorrhage. Large amount of soft tissue air is seen about the anterior margin of the left orbit and extending into the retro-orbital space. Suspect associated orbital floor fracture (see below). There is associated left orbital proptosis. Left orbital globe appears otherwise grossly normal in position and configuration. CT MAXILLOFACIAL FINDINGS There is a slightly displaced/ depressed fracture of the left orbital floor, depressed approximately 4 mm. No associated extraocular muscle entrapment. Remainder of the osseous structures about the left orbit appear intact. Osseous structures about the right orbit appear intact throughout. Lower frontal bones are intact. No displaced nasal bone fracture seen. Anterior and posterior walls of the bilateral maxillary sinuses are intact and well aligned. Bilateral zygoma and pterygoid plates are intact. No mandible fracture or displacement seen. CT CERVICAL SPINE FINDINGS Mild degenerative change noted within the lower cervical spine with associated mild disc space narrowings and mild osseous spurring. Straightening of the normal cervical lordosis likely related to these underlying  degenerative changes. No fracture line or displaced fracture fragment identified. Facet joints appear normally aligned throughout. Atherosclerotic calcifications noted at each carotid bulb region. Visualized paravertebral soft tissues are otherwise unremarkable. Mild to moderate emphysematous change noted at each lung apex. IMPRESSION: 1. Slightly displaced/depressed fracture of the left orbital floor, depressed approximately 3-4 mm, with associated large amount of soft tissue air about the left orbit. Also with associated left orbital proptosis. 2. No other facial bone fracture or displacement seen. 3. No acute intracranial abnormality. No intracranial hemorrhage or edema. The small parenchymal metastases identified on previous brain MRI are not seen on today's head CT (likely below the resolution of CT given their small size on MRI). 4. No fracture or acute subluxation identified within the cervical spine. Mild degenerative change within the lower cervical spine. 5. Emphysematous changes at each lung apex, mild to moderate in degree. Electronically Signed   By: Franki Cabot M.D.   On: 06/18/2015 20:52   I have personally reviewed and evaluated these images and lab results as part of my medical decision-making.   EKG Interpretation None      MDM   Final diagnoses:  Fall, initial encounter  Fracture of left orbital floor [facility])    Patient is neurologically intact. CT head, CT maxillofacial, CT cervical spine reveal a minimally displaced left inferior orbital fracture. Chest x-ray negative for pneumonia.  Discussed findings with patient and her family. Follow-up with her nose and throat.  I personally performed the services described in this documentation, which was scribed in my presence. The recorded information has been reviewed and is accurate.     Nat Christen, MD 06/18/15 [phone]

## 2015-06-18 NOTE — Discharge Instructions (Signed)
Chest x-ray was good. X-rays of face show a fracture of the left orbital floor. Follow-up with ear nose and throat dr. Phone number given.

## 2015-07-07 ENCOUNTER — Encounter: Payer: Self-pay | Admitting: Neurology

## 2015-07-07 ENCOUNTER — Ambulatory Visit (INDEPENDENT_AMBULATORY_CARE_PROVIDER_SITE_OTHER): Payer: Medicare Other | Admitting: Neurology

## 2015-07-07 VITALS — BP 104/68 | HR 74 | Ht 64.0 in | Wt 134.0 lb

## 2015-07-07 DIAGNOSIS — G9340 Encephalopathy, unspecified: Secondary | ICD-10-CM | POA: Diagnosis not present

## 2015-07-07 DIAGNOSIS — C7931 Secondary malignant neoplasm of brain: Secondary | ICD-10-CM

## 2015-07-07 DIAGNOSIS — B0229 Other postherpetic nervous system involvement: Secondary | ICD-10-CM | POA: Diagnosis not present

## 2015-07-07 DIAGNOSIS — C73 Malignant neoplasm of thyroid gland: Secondary | ICD-10-CM | POA: Diagnosis not present

## 2015-07-07 MED ORDER — PREGABALIN 100 MG PO CAPS
100.0000 mg | ORAL_CAPSULE | Freq: Two times a day (BID) | ORAL | Status: DC
Start: 2015-07-07 — End: 2015-10-09

## 2015-07-07 MED ORDER — PREGABALIN 75 MG PO CAPS: 75.0000 mg | ORAL_CAPSULE | Freq: Two times a day (BID) | ORAL | Status: DC

## 2015-07-07 MED ORDER — PREGABALIN 50 MG PO CAPS: 50.0000 mg | ORAL_CAPSULE | Freq: Three times a day (TID) | ORAL | Status: DC

## 2015-07-07 NOTE — Progress Notes (Signed)
Haley Lowe - Initial Visit   Date: 07/08/2015  Haley Lowe MRN: [id number] DOB: 03/22/1956   Dear Dr. Gerarda Fraction:  Thank you for your kind referral of Haley Lowe for consultation of encephalopathy. Although her history is well known to you, please allow [place] to reiterate it for the purpose of our medical record. The patient was accompanied to the clinic by husband and daughter who also provides collateral information.     History of Present Illness: Haley Lowe is a 60 y.o. 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    She was initially diagnosed with thyroid cancer in 2013 at which time she also had left posterior frontal metastasis s/p resection and XRT and chemotherapy with carboplatinin and paclitaxel.  In 2015, she underwent total thyroidectomy and was found to have metastasis to lymph nodes and later to the brain.  In March 2016, there was a new left cerebellar metastasis which was treated with radiation.  Later in 2016, imaging shows multiple new metastases involving the brain and cerebellum for which she underwent whole brain radiation in November 2016.  Soon after this, she began having recurrent bouts of aspiration pneumonia and had 6 interval hospitalizations between December and March. In December, she also had a herpes infection of the skin and started on valcyclovir. Family has noticed that during an acute infection, she would always be somewhat disoriented, but would always return to her baseline.  In January 2017, she was readmitted and noticed that her memory started to decline, despite her infection being treated.  However, again in March after being treated with rocephin, she became lucid again and memory returned.  Because of her complex history, recent varicella infection, and  fluctuating mental status, she was referred for further evaluation due to concern of encephalitis.  She is followed by Dr. Rodney Booze and Dr. Armstead Peaks at [facility] for thyroid cancer.  Currently, she requires help from a caregiver with bathing and dressing.  Her husband does household chores such as cooking, cleaning, and managing the finances.    Her most recent MRI brain was in January 2017 with contrast which showed no interval metastasis and reduced size of all brain mets previously noted.    Out-side paper records, electronic medical record, and images have been reviewed where available and summarized as:  MRI brain wwo contrast 05/05/2015:   1. Mildly to moderately motion degraded examination. No acute intracranial abnormality. 2. Decreased size of all brain metastases, with some no longer visible. 3. No new metastases identified within limitations of motion Artifact.  CT head wo contrast 05/04/2015:  Stable 57m low-attenuation lesion in the left cerebellum, consistent with treated metastasis  CT cervical spine wo contrast 06/18/2015: 1. Slightly displaced/depressed fracture of the left orbital floor, depressed approximately 3-4 mm, with associated large amount of soft tissue air about the left orbit. Also with associated left orbital proptosis. 2. No other facial bone fracture or displacement seen. 3. No acute intracranial abnormality. No intracranial hemorrhage or edema. The small parenchymal metastases identified on previous brain MRI are not seen on today's head CT (likely below the resolution of CT given their small size on MRI). 4. No fracture or acute subluxation identified within the cervical spine. Mild degenerative change within the lower cervical spine. 5. Emphysematous changes at each lung apex, mild to moderate in degree.  Lab  Results  Component Value Date   TSH 0.819 03/17/2015   Lab Results  Component Value Date   ZCHYIFOY77 4128*  03/17/2015   Lab Results  Component Value Date   HGBA1C 6.9* 03/17/2015     Past Medical History  Diagnosis Date  . Essential hypertension   . Anxiety   . Depression   . COPD (chronic obstructive pulmonary disease) ([place])   . GERD (gastroesophageal reflux disease)   . Headache(784.0)   . Arthritis   . Hx of adenomatous colonic polyps   . Emphysema   . Cataract     1 lens implaqnt right eye,intact on left eye  . Goiter   . Fibromyalgia   . Pulmonary emboli ([place]) 11/04/11    Right upper lobe and r lower lobe PE  . Lung nodule seen on imaging study 11/04/11 CT    83m LLL  . DVT (deep venous thrombosis) ([facility] 12/09/2011  . Pulmonary embolism ([facility] 12/09/2011  . Hyperlipidemia   . History of radiation therapy 10/06/11    SRS 15Gy 123f brain  . UTI (lower urinary tract infection)   . Borderline diabetes mellitus   . Metastatic adenocarcinoma to brain ([facility]13    Left frontoparietal region  . Thyroid cancer ([facility]  . S/P radiation therapy 05/31/14 SRS    Brain  . PONV (postoperative nausea and vomiting)   . Controlled diabetes mellitus type II without complication ([id number]  Previously diagnosed with borderline diabetes.  . Marland Kitchenerpes simplex esophagitis 03/11/2015    Also candidal esophagitis  . HCAP (healthcare-associated pneumonia) 03/03/2015  . Mucositis due to radiation therapy 03/03/2015    Past Surgical History  Procedure Laterality Date  . Abdominal hysterectomy  1996  . Cholecystectomy  2000  . Shoulder surgery Right 1998  . Eye surgery  2001  . Foot surgery Left 2005  . Craniotomy  09/15/2011    Procedure: CRANIOTOMY TUMOR EXCISION;  Surgeon: RoHosie SpangleMD;  [facility];  Service: Neurosurgery;  Laterality: N/A;  Craniotomy resection of tumor with stealth  . Cataract extraction  2011    With lens implant  . [facility]    . Video bronchoscopy with  endobronchial ultrasound N/A 04/11/2014    Procedure: VIDEO BRONCHOSCOPY WITH ENDOBRONCHIAL ULTRASOUND;  Surgeon: StMelrose NakayamaMD;  [facility] Service: Thoracic;  Laterality: N/A;  . Esophagogastroduodenoscopy (egd) with propofol N/A 03/07/2015    Procedure: ESOPHAGOGASTRODUODENOSCOPY (EGD) WITH PROPOFOL;  Surgeon: RoDaneil DolinMD;  [facility];  Service: Endoscopy;  Laterality: N/A;  . Biopsy  03/07/2015    Procedure: BIOPSY;  Surgeon: RoDaneil DolinMD;  [facility];  Service: Endoscopy;;     Medications:  Outpatient Encounter Prescriptions as of 07/07/2015  Medication Sig Lowe  . acetaminophen (TYLENOL) 325 MG tablet Take 2 tablets (650 mg total) by mouth every 6 (six) hours as needed for mild pain.   . Marland Kitchenlbuterol (PROVENTIL) (2.5 MG/3ML) 0.083% nebulizer solution Take 3 mLs (2.5 mg total) by nebulization every 4 (four) hours as needed for wheezing or shortness of breath.   . ALPRAZolam (XANAX) 1 MG tablet Take 0.5 mg by mouth at bedtime as needed for anxiety.   . Marland Kitchenevothyroxine (SYNTHROID, LEVOTHROID) 137 MCG tablet Take 137 mcg by mouth daily before breakfast.   . magic mouthwash w/lidocaine SOLN Take 5 mLs by mouth 4 (  four) times daily as needed for mouth pain.   . megestrol (MEGACE) 400 MG/10ML suspension Take 10 mLs (400 mg total) by mouth daily.   Marland Kitchen morphine (MS CONTIN) 30 MG 12 hr tablet Take 30 mg by mouth every 12 (twelve) hours.   Marland Kitchen nystatin (MYCOSTATIN) 100000 UNIT/ML suspension Use as directed 5 mLs (500,000 Units total) in the mouth or throat 4 (four) times daily. Take for 1 week then stop. (Patient taking differently: Use as directed 5 mLs in the mouth or throat 4 (four) times daily as needed (for thrush). Take for 1 week then stop.)   . ondansetron (ZOFRAN ODT) 8 MG disintegrating tablet Take 1 tablet (8 mg total) by mouth every 8 (eight) hours as needed for nausea or vomiting.   . Oxycodone HCl 20 MG TABS Take 1 tablet by mouth every 4  (four) hours as needed (pain).   . pantoprazole (PROTONIX) 40 MG tablet Take 1 tablet (40 mg total) by mouth 2 (two) times daily.   . polyethylene glycol (MIRALAX / GLYCOLAX) packet Take 17 g by mouth daily as needed for moderate constipation. Reported on 04/27/2015   . potassium chloride SA (K-DUR,KLOR-CON) 20 MEQ tablet Take 40 mEq by mouth 2 (two) times daily. Dissolve in water and take either in applesauce or drink the water.   . sertraline (ZOLOFT) 50 MG tablet Take 50 mg by mouth daily.   . pregabalin (LYRICA) 100 MG capsule Take 1 capsule (100 mg total) by mouth 2 (two) times daily.   . pregabalin (LYRICA) 50 MG capsule Take 1 capsule (50 mg total) by mouth 3 (three) times daily.   . pregabalin (LYRICA) 75 MG capsule Take 1 capsule (75 mg total) by mouth 2 (two) times daily.   . [DISCONTINUED] amitriptyline (ELAVIL) 25 MG tablet Take 1 tablet (25 mg total) by mouth at bedtime. (Patient not taking: Reported on 06/18/2015)   . [DISCONTINUED] benzonatate (TESSALON) 100 MG capsule Take 1 capsule (100 mg total) by mouth 3 (three) times daily as needed for cough. (Patient not taking: Reported on 06/18/2015)   . [DISCONTINUED] cefpodoxime (VANTIN) 200 MG tablet Take 1 tablet by mouth 2 (two) times daily.   . [DISCONTINUED] clotrimazole (GYNE-LOTRIMIN) 1 % vaginal cream Place 1 Applicatorful vaginally at bedtime. (Patient not taking: Reported on 06/18/2015)   . [DISCONTINUED] feeding supplement, ENSURE ENLIVE, (ENSURE ENLIVE) LIQD Take 237 mLs by mouth 2 (two) times daily between meals. (Patient not taking: Reported on 06/18/2015)   . [DISCONTINUED] levothyroxine (SYNTHROID, LEVOTHROID) 175 MCG tablet Take 175 mcg by mouth daily before breakfast.   . [DISCONTINUED] sucralfate (CARAFATE) 1 G tablet Take 1 tablet (1 g total) by mouth 4 (four) times daily -  with meals and at bedtime. (Patient not taking: Reported on 06/18/2015)   . [DISCONTINUED] valACYclovir (VALTREX) 1000 MG tablet Take 1 tablet (1,000 mg  total) by mouth daily. (Patient not taking: Reported on 06/18/2015) 05/04/2015: continuous.  . [DISCONTINUED] Wound Dressings (SONAFINE) Apply 1 application topically daily.    No facility-administered encounter medications on file as of 07/07/2015.     Allergies:  Allergies  Allergen Reactions  . Penicillins Swelling    " my brain swelled, and mymouth" Has patient had a PCN reaction causing immediate rash, facial/tongue/throat swelling, SOB or lightheadedness with hypotension: Yes Has patient had a PCN reaction causing severe rash involving mucus membranes or skin necrosis: No Has patient had a PCN reaction that required hospitalization No Has patient had a PCN reaction occurring within  the last 10 years: No If all of the above answers are "NO", then may proceed with Cephalosporin use.   . Other Swelling    PT ALSO REACTS TO PAPER TAPE AND BAND-AIDS.  Marland Kitchen Codeine Itching    Mild takes benadryl  . Potassium-Containing Compounds Other (See Comments)    Potassium Liquid only- burns the mouth and throat, pt is able to take tablets dissolved.  . Tape Rash    Family History: Family History  Problem Relation Age of Onset  . Asthma Mother   . Kidney failure Father   . Diabetes Sister   . Heart attack Sister   . Colon cancer Brother   . COPD Sister   . Aneurysm Paternal Grandmother     Brain  . Parkinsonism Maternal Uncle   . COPD Brother     Social History: Social History  Substance Use Topics  . Smoking status: Former Smoker -- 1.00 packs/day for 40 years    Types: Cigarettes    Quit date: 04/06/2011  . Smokeless tobacco: Never Used  . Alcohol Use: No   Social History   Social History Narrative   Married, unemployed       Review of Systems:  CONSTITUTIONAL: No fevers, chills, night sweats, +weight loss.   EYES: No visual changes or eye pain ENT: No hearing changes.  No history of nose bleeds.   RESPIRATORY: No cough, wheezing +shortness of breath.   CARDIOVASCULAR:  Negative for chest pain, and palpitations.   GI: Negative for abdominal discomfort, blood in stools or black stools.  No recent change in bowel habits.   GU:  No history of incontinence.   MUSCLOSKELETAL: No history of joint pain or swelling.  No myalgias.   SKIN: Negative for lesions, +rash, and itching.   HEMATOLOGY/ONCOLOGY: Negative for prolonged bleeding, bruising easily, and swollen nodes.  No history of cancer.   ENDOCRINE: Negative for cold or heat intolerance, polydipsia or goiter.   PSYCH:  +depression or anxiety symptoms.   NEURO: As Above.   Vital Signs:  BP 104/68 mmHg  Pulse 74  Ht '5\' 4"'$  (1.626 m)  Wt 134 lb (60.782 kg)  BMI 22.99 kg/m2   General Medical Exam:   General:  Ill-appearing, comfortable.   Eyes/ENT: see cranial nerve examination.   Neck: No masses appreciated.  Full range of motion without tenderness.  No carotid bruits.  No meningeal signs.  She has burning pain over the posterior neck from shingles. Well healed scar noted over the posterior scalp on the right. Respiratory:  Coarse breath sounds on the right side, good air entry bilaterally.   Cardiac:  Regular rate and rhythm, no murmur.   Extremities:  No deformities, edema, or skin discoloration.  Skin:  No rashes or lesions.  Neurological Exam: MENTAL STATUS including orientation to time, place, person, recent and remote memory, attention span and concentration, language, and fund of knowledge is poor.  Speech is raspy with poor spontaneous speech.  Montreal Cognitive Assessment  07/07/2015  Visuospatial/ Executive (0/5) 1  Naming (0/3) 1  Attention: Read list of digits (0/2) 1  Attention: Read list of letters (0/1) 0  Attention: Serial 7 subtraction starting at 100 (0/3) 0  Language: Repeat phrase (0/2) 0  Language : Fluency (0/1) 0  Abstraction (0/2) 1  Delayed Recall (0/5) 0  Orientation (0/6) 3  Total 7  Adjusted Score (based on education) 8    CRANIAL NERVES: II:  ?Right homonomous  hemianopia.  Unremarkable fundi.  III-IV-VI: Pupils equal round and reactive to light.  Normal conjugate, extra-ocular eye movements in all directions of gaze.  No nystagmus.  No ptosis.   V:  Normal facial sensation.  Jaw jerk is absent.   VII:  Normal facial symmetry and movements. Myerson's sign is present.  Negative palmomental and Snout.  Grasp is present on right.  VIII:  Normal hearing and vestibular function.   IX-X:  Normal palatal movement.   XI:  Normal shoulder shrug and head rotation.   XII:  Normal tongue strength and range of motion, no deviation or fasciculation.  MOTOR:  No atrophy, fasciculations or abnormal movements.  No pronator drift.  Tone is normal.    Right Upper Extremity:    Left Upper Extremity:    Deltoid  5/5   Deltoid  5/5   Biceps  5/5   Biceps  5/5   Triceps  5/5   Triceps  5/5   Wrist extensors  5/5   Wrist extensors  5/5   Wrist flexors  5/5   Wrist flexors  5/5   Finger extensors  5/5   Finger extensors  5/5   Finger flexors  5/5   Finger flexors  5/5   Dorsal interossei  4+/5   Dorsal interossei  4+/5   Abductor pollicis  5/5   Abductor pollicis  5/5   Tone (Ashworth scale)  0  Tone (Ashworth scale)  0   Right Lower Extremity:    Left Lower Extremity:    Hip flexors  5/5   Hip flexors  5/5   Hip extensors  5/5   Hip extensors  5/5   Knee flexors  5/5   Knee flexors  5/5   Knee extensors  5/5   Knee extensors  5/5   Dorsiflexors  5/5   Dorsiflexors  5/5   Plantarflexors  5/5   Plantarflexors  5/5   Toe extensors  5/5   Toe extensors  5/5   Toe flexors  5/5   Toe flexors  5/5   Tone (Ashworth scale)  0  Tone (Ashworth scale)  0   MSRs:  Right                                                                 Left brachioradialis 3+  brachioradialis 3+  biceps 3+  biceps 3+  triceps 3+  triceps 3+  patellar 3+  patellar 3+  ankle jerk 2+  ankle jerk 2+  Hoffman yes  Hoffman yes  plantar response down  plantar response down   SENSORY:  Absent  vibration, pin prick, and temperature distal to ankles bilaterally. Romberg's sign is present.   COORDINATION/GAIT: Normal finger-to- nose-finger.  Intact rapid alternating movements bilaterally.  Unable to rise from a chair without using arms.  Gait appears unsteady with turning and it takes several attempts to get out of chair, but when walking she appears stable.      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  She performed poorly on cognitive screening scoring only 8/30.  She does appear tired and distracted.  There are no meningeal signs or focal  neurological deficits.  She has brisk reflexes throughout which suggests upper motor neuron dysfunction, but can be seen in brain metastasis, whole brain radiation, or other intracranial processes.   Because of her recent varicella zoster infection and immunocompromised state, I will investigate for infectious encephalitis, however it would be unusual for her mental status to fluctuate as I would expect more of an insidious and progressive neurological decline.  MRI brain wwo contrast and routine EEG will be ordered.  She has known intracranial metastasis, the left frontal lesion being of epileptogenic potential.  Pending the results of the MRI brain, the next step will be CSF testing.    PLAN/RECOMMENDATIONS:  1.  MRI brain wwo contrast 2.  Routine EEG 3.  Consider CSF testing going forward based on the results of the above 4.  Start Lyrica '50mg'$  BID and titrate to '100mg'$  BID for post-herpetic neuralgia  Return to clinic in 1 month   The duration of this appointment visit was 65 minutes of face-to-face time with the patient.  Greater than 50% of this time was spent in counseling, explanation of diagnosis, planning of further management, and coordination of care.   Thank you for allowing me to  participate in patient's care.  If I can answer any additional questions, I would be pleased to do so.    Sincerely,    Mal Asher K. Posey Pronto, DO

## 2015-07-07 NOTE — Patient Instructions (Addendum)
1.  Routine EEG 2.  MRI brain wwo contrast 3.  Start Lyrica '50mg'$  twice daily for one week, then increase to '75mg'$  twice daily for one week.    Samples provided 4.  If tolerating the Lyrica after two weeks, start '100mg'$  twice daily 5.  We will call you with the results

## 2015-07-08 ENCOUNTER — Other Ambulatory Visit (HOSPITAL_COMMUNITY): Payer: Self-pay | Admitting: Internal Medicine

## 2015-07-08 ENCOUNTER — Ambulatory Visit (HOSPITAL_COMMUNITY)
Admission: RE | Admit: 2015-07-08 | Discharge: 2015-07-08 | Disposition: A | Discharge status: Home / Self Care | Payer: Medicare Other | Source: Ambulatory Visit | Attending: Internal Medicine | Admitting: Internal Medicine

## 2015-07-08 DIAGNOSIS — J189 Pneumonia, unspecified organism: Secondary | ICD-10-CM

## 2015-07-08 DIAGNOSIS — R918 Other nonspecific abnormal finding of lung field: Secondary | ICD-10-CM | POA: Diagnosis not present

## 2015-07-09 NOTE — Progress Notes (Signed)
Note routed and faxed.

## 2015-07-11 ENCOUNTER — Telehealth: Payer: Self-pay | Admitting: Neurology

## 2015-07-11 NOTE — Telephone Encounter (Signed)
Pt wants to check on the states of the MRI please call [phone]

## 2015-07-11 NOTE — Telephone Encounter (Signed)
Called [facility] and they said that they tried to contact patient but her phone just rang and had no voicemail.  Called patient back and spoke her her daughter.  Instructed her to have patient call [facility] to set up the appointment.  She agreed with plan.  Phone # given.

## 2015-07-16 ENCOUNTER — Ambulatory Visit (INDEPENDENT_AMBULATORY_CARE_PROVIDER_SITE_OTHER): Payer: Medicare Other | Admitting: Neurology

## 2015-07-16 DIAGNOSIS — G9340 Encephalopathy, unspecified: Secondary | ICD-10-CM | POA: Diagnosis not present

## 2015-07-16 DIAGNOSIS — C7931 Secondary malignant neoplasm of brain: Secondary | ICD-10-CM

## 2015-07-16 DIAGNOSIS — C73 Malignant neoplasm of thyroid gland: Secondary | ICD-10-CM

## 2015-07-17 NOTE — Procedures (Signed)
ELECTROENCEPHALOGRAM REPORT  Date of Study: 07/16/2015  Patient's Name: Haley Lowe MRN: [id number] Date of Birth: [birth year]  Referring Provider: Dr. Narda Amber  Clinical History: This is a 60 year old woman with fluctuating encephalopathy  Medications: Lyrica, Tylenol, Proventil,  Xanax, Levothyroxine, Megace, MS Contin, Mycostatin, Zofran, Oxycodone, Protonix, Miralax/Glycolax, K-Dur/Klor-Con, Zoloft  Technical Summary: A multichannel digital EEG recording measured by the international 10-20 system with electrodes applied with paste and impedances below 5000 ohms performed as portable with EKG monitoring in an awake and asleep patient.  Hyperventilation was not performed. Photic stimulation was performed.  The digital EEG was referentially recorded, reformatted, and digitally filtered in a variety of bipolar and referential montages for optimal display.   Description: The patient is predominantly drowsy and asleep during the recording.  During brief period of wakefulness, there is a symmetric, medium voltage 8 Hz posterior dominant rhythm that attenuates with eye opening. This is admixed with a small amount of diffuse 4-7 Hz theta slowing of the waking background. There is breach artifact noted over the left parietal region with higher amplitude and sharply contoured wave forms seen. During drowsiness and sleep, there is an increase in theta and delta slowing of the background with occasional vertex waves and symmetrical sleep spindles seen. Photic stimulation did not elicit any abnormalities.  There were no epileptiform discharges or electrographic seizures seen.    EKG lead was unremarkable.  Impression: This predominantly drowsy and asleep EEG is abnormal due to the presence of: 1. Mild diffuse slowing of the waking background 2. Breach artifact over the left parietal region  Clinical Correlation of the above findings indicates mild diffuse cerebral dysfunction that is  non-specific in etiology and can be seen with hypoxic/ischemic injury, toxic/metabolic encephalopathies, neurodegenerative disorders, medication effect, or can be seen with excessive drowsiness. Breach artifact is consistent with prior history of surgery in this region.  The absence of epileptiform discharges does not rule out a clinical diagnosis of epilepsy.  Clinical correlation is advised.   Ellouise Newer, M.D.

## 2015-07-21 ENCOUNTER — Ambulatory Visit
Admission: RE | Admit: 2015-07-21 | Discharge: 2015-07-21 | Disposition: A | Discharge status: Home / Self Care | Payer: Medicare Other | Source: Ambulatory Visit | Attending: Neurology | Admitting: Neurology

## 2015-07-21 DIAGNOSIS — C73 Malignant neoplasm of thyroid gland: Secondary | ICD-10-CM

## 2015-07-21 DIAGNOSIS — G9340 Encephalopathy, unspecified: Secondary | ICD-10-CM

## 2015-07-21 DIAGNOSIS — C7931 Secondary malignant neoplasm of brain: Secondary | ICD-10-CM

## 2015-07-21 MED ORDER — GADOBENATE DIMEGLUMINE 529 MG/ML IV SOLN
12.0000 mL | Freq: Once | INTRAVENOUS | Status: AC | PRN
  Administered 2015-07-21: 12 mL via INTRAVENOUS

## 2015-07-23 ENCOUNTER — Telehealth: Payer: Self-pay | Admitting: Neurology

## 2015-07-23 NOTE — Telephone Encounter (Signed)
Called and informed patient about results of MRI brain which shows no new lesions and improvement of previously seen mets.  There is still white matter changes, likely due to whole-brain radiation.  EEG does not show any seizure activity, only mild diffuse encephalopathy.    I also attempted to contact patient's daughter via phone today regarding the results of the above, however there was no answer so a message was left for the patient to return my call.   Ayvin Lipinski K. Posey Pronto, DO

## 2015-08-04 ENCOUNTER — Ambulatory Visit
Admission: RE | Admit: 2015-08-04 | Discharge: 2015-08-04 | Disposition: A | Discharge status: Home / Self Care | Payer: Medicare Other | Source: Ambulatory Visit | Attending: Radiation Oncology | Admitting: Radiation Oncology

## 2015-08-04 ENCOUNTER — Encounter: Payer: Self-pay | Admitting: Radiation Oncology

## 2015-08-04 VITALS — BP 112/78 | HR 79 | Temp 98.5°F | Resp 18 | Ht 64.0 in | Wt 133.7 lb

## 2015-08-04 DIAGNOSIS — C799 Secondary malignant neoplasm of unspecified site: Secondary | ICD-10-CM

## 2015-08-04 DIAGNOSIS — C73 Malignant neoplasm of thyroid gland: Secondary | ICD-10-CM

## 2015-08-04 DIAGNOSIS — R51 Headache: Secondary | ICD-10-CM

## 2015-08-04 DIAGNOSIS — R519 Headache, unspecified: Secondary | ICD-10-CM

## 2015-08-04 NOTE — Progress Notes (Signed)
Haley Lowe here for follow up.  She reports having pain at a 8/10 in her face, ears, neck and left chest.  She reports having this pain since having radiation and surgery.  She reports having double vision that started after radiation.  She reports having balance issues.  Her husband reports that her short term memory is off.  She is not taking decadron.  BP 112/78 mmHg  Pulse 79  Temp(Src) 98.5 F (36.9 C) (Oral)  Resp 18  Ht '5\' 4"'$  (1.626 m)  Wt 133 lb 11.2 oz (60.646 kg)  BMI 22.94 kg/m2  SpO2 96%   Wt Readings from Last 3 Encounters:  08/04/15 133 lb 11.2 oz (60.646 kg)  07/07/15 134 lb (60.782 kg)  06/18/15 135 lb (61.236 kg)

## 2015-08-05 ENCOUNTER — Telehealth: Payer: Self-pay | Admitting: *Deleted

## 2015-08-05 NOTE — Telephone Encounter (Signed)
CALLED PATIENT TO INFORM OF FU WITH Haley Lowe ON 11-11-15 AND HER APPT. WITH DR. Orpah Greek BATES ON 08-20-15- ARRIVAL TIME - 9 AM, LVM FOR A RETURN CALL

## 2015-08-05 NOTE — Telephone Encounter (Signed)
Sherry,patient's daughter returned call about up coming appt,s gave her information  Per Shirley's note, Dr. Melida Quitter appt  08/20/15 arrive at his office at [place], address and phone number given to daughter [address], [address], Phone [phone], and Shona Simpson f/u appt is for 11/11/15 at 1:15pm, arrive 15 min earlier to check in lobby at cancer center, daughter  Hanley Seamen verbal understanding and repeated times and dates of both Not otherwise examined today.

## 2015-08-05 NOTE — Progress Notes (Signed)
Radiation Oncology         [phone] ________________________________  Name: Haley Lowe MRN: [id number]  Date: 08/04/2015  DOB: November 23, 1955  Follow-Up Visit Note  CC: [name], MD  Redmond School, MD  Diagnosis:   Metastatic papillary thyroid carcinoma.  Interval Since Last Radiation: 5 months  02/06/2015 through 02/26/2015: The patient was treated with a course of whole brain radiation treatment as well as concurrent treatment to the cervical lymph node regions bilaterally. She received a dose of 37.5 gray in 15 fractions at 2.5 gray per fraction using and IMRT technique.  Narrative:  The patient returns today for routine follow-up.  She did undergo repeat MRI on 07/21/15 revealing bilateral mastoid effusions, unchanged on the left, increased on the right, continued improvement in the bilateral superior cerebellar metastatic deposits and no new lesions.                           On review of systems, the patient states that she continues to suffer with pain in the right side of her face. She describes this as a sharp almost burning like quality at times which radiates to her neck and occasionally her chest wall. She feels that this is constant and responds to pain medication, though she is frustrated as to why she has this. She denies any history of CVA or history of trigeminal neuralgia. She denies TMJ. She does not have any loss of function on that side or loss of sensation. She continues to have some visual changes including double vision and some difficulty with her balance which is unchanged since her radiation. She denies any sternal chest pain, shortness of breath, cough, fevers, chills, night sweats, unintended weight changes. She denies any bowel or bladder disturbances, and denies abdominal pain, nausea or vomiting. She denies any new musculoskeletal or joint aches or pains. A complete review of systems is obtained and is otherwise negative.   Past Medical History:  Past  Medical History  Diagnosis Date  . Essential hypertension   . Anxiety   . Depression   . COPD (chronic obstructive pulmonary disease) ([place])   . GERD (gastroesophageal reflux disease)   . Headache(784.0)   . Arthritis   . Hx of adenomatous colonic polyps   . Emphysema   . Cataract     1 lens implaqnt right eye,intact on left eye  . Goiter   . Fibromyalgia   . Pulmonary emboli ([place]) 11/04/11    Right upper lobe and r lower lobe PE  . Lung nodule seen on imaging study 11/04/11 CT    66m LLL  . DVT (deep venous thrombosis) ([facility] 12/09/2011  . Pulmonary embolism ([place] 12/09/2011  . Hyperlipidemia   . History of radiation therapy 10/06/11    SRS 15Gy 120f brain  . UTI (lower urinary tract infection)   . Borderline diabetes mellitus   . Metastatic adenocarcinoma to brain ([facility]6/12/13    Left frontoparietal region  . Thyroid cancer ([facility]2015  . S/P radiation therapy 05/31/14 SRS    Brain  . PONV (postoperative nausea and vomiting)   . Controlled diabetes mellitus type II without complication ([id number]  Previously diagnosed with borderline diabetes.  . Marland Kitchenerpes simplex esophagitis 03/11/2015    Also candidal esophagitis  . HCAP (healthcare-associated pneumonia) 03/03/2015  . Mucositis due to radiation therapy 03/03/2015    Past Surgical History: Past Surgical History  Procedure Laterality Date  . Abdominal hysterectomy  1996  . Cholecystectomy  2000  . Shoulder surgery Right 1998  . Eye surgery  2001  . Foot surgery Left 2005  . Craniotomy  09/15/2011    Procedure: CRANIOTOMY TUMOR EXCISION;  Surgeon: Hosie Spangle, MD;  Location: [place] [facility];  Service: Neurosurgery;  Laterality: N/A;  Craniotomy resection of tumor with stealth  . Cataract extraction  2011    With lens implant  . Total thyroidectomy  10-29-2013    [facility]  . Lymph gland excision    . Video bronchoscopy with endobronchial ultrasound N/A 04/11/2014    Procedure: VIDEO  BRONCHOSCOPY WITH ENDOBRONCHIAL ULTRASOUND;  Surgeon: Melrose Nakayama, MD;  Location: [place];  Service: Thoracic;  Laterality: N/A;  . Esophagogastroduodenoscopy (egd) with propofol N/A 03/07/2015    Procedure: ESOPHAGOGASTRODUODENOSCOPY (EGD) WITH PROPOFOL;  Surgeon: Daneil Dolin, MD;  [facility];  Service: Endoscopy;  Laterality: N/A;  . Biopsy  03/07/2015    Procedure: BIOPSY;  Surgeon: Daneil Dolin, MD;  Location: [facility];  Service: Endoscopy;;    Social History:  Social History   Social History  . Marital Status: Married    Spouse Name: N/A  . Number of Children: 4  . Years of Education: N/A   Occupational History  . disabled     prev Time [employer]   Social History Main Topics  . Smoking status: Former Smoker -- 1.00 packs/day for 40 years    Types: Cigarettes    Quit date: 04/06/2011  . Smokeless tobacco: Never Used  . Alcohol Use: No  . Drug Use: No  . Sexual Activity: Yes   Other Topics Concern  . Not on file   Social History Narrative   Married, unemployed       Family History: Family History  Problem Relation Age of Onset  . Asthma Mother   . Kidney failure Father   . Diabetes Sister   . Heart attack Sister   . Colon cancer Brother   . COPD Sister   . Aneurysm Paternal Grandmother     Brain  . Parkinsonism Maternal Uncle   . COPD Brother     ALLERGIES:  is allergic to penicillins; other; codeine; potassium-containing compounds; and tape.  Meds: Current Outpatient Prescriptions  Medication Sig Dispense Refill  . acetaminophen (TYLENOL) 325 MG tablet Take 2 tablets (650 mg total) by mouth every 6 (six) hours as needed for mild pain.    Marland Kitchen albuterol (PROVENTIL) (2.5 MG/3ML) 0.083% nebulizer solution Take 3 mLs (2.5 mg total) by nebulization every 4 (four) hours as needed for wheezing or shortness of breath. 75 mL 12  . ALPRAZolam (XANAX) 1 MG tablet Take 0.5 mg by mouth at bedtime as needed for anxiety.    Marland Kitchen levothyroxine  (SYNTHROID, LEVOTHROID) 137 MCG tablet Take 137 mcg by mouth daily before breakfast.    . magic mouthwash w/lidocaine SOLN Take 5 mLs by mouth 4 (four) times daily as needed for mouth pain. 400 mL 0  . megestrol (MEGACE) 400 MG/10ML suspension Take 10 mLs (400 mg total) by mouth daily. 240 mL 0  . ondansetron (ZOFRAN ODT) 8 MG disintegrating tablet Take 1 tablet (8 mg total) by mouth every 8 (eight) hours as needed for nausea or vomiting. 20 tablet 0  . Oxycodone HCl 20 MG TABS Take 1 tablet by mouth every 4 (four) hours as needed (pain).    . pantoprazole (PROTONIX) 40 MG tablet Take 1 tablet (40 mg  total) by mouth 2 (two) times daily. 60 tablet 0  . polyethylene glycol (MIRALAX / GLYCOLAX) packet Take 17 g by mouth daily as needed for moderate constipation. Reported on 04/27/2015    . potassium chloride SA (K-DUR,KLOR-CON) 20 MEQ tablet Take 40 mEq by mouth 2 (two) times daily. Dissolve in water and take either in applesauce or drink the water.    . pregabalin (LYRICA) 100 MG capsule Take 1 capsule (100 mg total) by mouth 2 (two) times daily. 60 capsule 5  . sertraline (ZOLOFT) 50 MG tablet Take 50 mg by mouth daily.    Marland Kitchen morphine (MS CONTIN) 30 MG 12 hr tablet Take 30 mg by mouth every 12 (twelve) hours. Reported on 08/04/2015    . nystatin (MYCOSTATIN) 100000 UNIT/ML suspension Use as directed 5 mLs (500,000 Units total) in the mouth or throat 4 (four) times daily. Take for 1 week then stop. (Patient not taking: Reported on 08/04/2015) 473 mL 0  . pregabalin (LYRICA) 50 MG capsule Take 1 capsule (50 mg total) by mouth 3 (three) times daily. (Patient not taking: Reported on 08/04/2015) 21 capsule 0  . pregabalin (LYRICA) 75 MG capsule Take 1 capsule (75 mg total) by mouth 2 (two) times daily. (Patient not taking: Reported on 08/04/2015) 14 capsule 0   No current facility-administered medications for this encounter.    Physical Findings:  height is '5\' 4"'$  (1.626 m) and weight is 133 lb 11.2 oz (60.646  kg). Her oral temperature is 98.5 F (36.9 C). Her blood pressure is 112/78 and her pulse is 79. Her respiration is 18 and oxygen saturation is 96%. .   Pain scale 8/10, right side of her face In general this is a well appearing Caucasian female in no acute distress. She's alert and oriented x4 and appropriate throughout the examination. Cardiopulmonary assessment is negative for acute distress and she exhibits normal effort. The patient is normocephalic, atraumatic. EOMs are intact. Light touch is intact on bilateral aspects of her face without reproducible pain.   Lab Findings: Lab Results  Component Value Date   WBC 2.7* 05/06/2015   HGB 9.8* 05/06/2015   HCT 31.6* 05/06/2015   MCV 97.2 05/06/2015   PLT 197 05/06/2015     Radiographic Findings: Dg Chest 2 View  07/08/2015  CLINICAL DATA:  History of pneumonia, weakness EXAM: CHEST  2 VIEW COMPARISON:  Chest x-ray of 06/18/2015, CT chest of 04/28/2015, and 04/18/2015 FINDINGS: Particularly when compared to the CT chest images, there are still prominent markings at the lung bases suspicious for residual pneumonia. No definite pleural effusion is seen. Also prominent perihilar markings are noted with some peribronchial thickening most consistent with bronchitis. Mediastinal and hilar contours are unchanged. The heart is within upper limits normal. No bony abnormality is seen. IMPRESSION: 1. There are still prominent markings at the lung bases suspicious for residual pneumonia. 2. Also prominent perihilar markings may indicate bronchitis. Electronically Signed   By: Ivar Drape M.D.   On: 07/08/2015 15:13   Mr [name] WU Contrast  07/21/2015  CLINICAL DATA:  Recurrent thyroid cancer. Brain metastasis. Encephalopathy. Craniotomy and radiation for metastatic disease. EXAM: MRI HEAD WITHOUT AND WITH CONTRAST TECHNIQUE: Multiplanar, multiecho pulse sequences of the brain and surrounding structures were obtained without and with intravenous contrast.  CONTRAST:  1m MULTIHANCE GADOBENATE DIMEGLUMINE 529 MG/ML IV SOLN COMPARISON:  CT head 06/18/2015.  MRI 05/05/2015 MRI 01/23/2015 FINDINGS: Left parietal craniotomy for tumor removal. Encephalomalacia and chronic blood products in  the area. No recurrent tumor in the surgical bed. Postcontrast imaging degraded by motion. This could obscure small lesions. Bilateral superior cerebellar lesions continue to improve. Small cerebral hemispheric lesions seen on the prior study of 01/23/2015 have essentially resolved. No new lesions identified.  Leptomeningeal enhancement normal. Generalized atrophy similar to the prior study. Periventricular white matter hyperintensities has progressed in the interval likely related to radiation. Progressive hyperintensity in the pons bilaterally. Negative for acute infarct.  No shift of the midline structures. Bilateral mastoid effusion unchanged on the left and mild progression on the right Mild mucosal edema in the paranasal sinuses. No mass lesion in the orbit. Normal pituitary and skullbase. IMPRESSION: Image quality degraded by mild motion Continued improvement in bilateral superior cerebellar metastatic deposits. Multiple cerebral hemispheric lesions seen on earlier studies no longer visible. No new lesions. Progression of periventricular white matter hyperintensity most likely due to whole-brain radiation. Mucosal edema in the paranasal sinuses. Bilateral mastoid sinus effusion with mild progression on the right. Electronically Signed   By: Franchot Gallo M.D.   On: 07/21/2015 14:02    Impression/Plan: 1. Papillary Thyroid cancer with metastases to the brain. The patient's imaging continues to show stable findings and does not reveal any apparent sites of disease in the brain. The patient continues on systemic therapy with [facility], and will see them again in the next month or so for follow up. We discussed the rationale for close follow up and repeat imaging in 3 months  time. She states agreement and understanding and is encouraged to call prior to that visit if she has concerns. 2. Facial pain. The patient has been experiencing facial pain since she was diagnosed with brain mets. She continues to take chronic pain medication, but it does not appear that her pain has been formally worked up. I'd like to set her up to meet with ENT for further evaluation as she does have some chronic mastoid change on her MRI and to see if they have suggestions as to the source of her symptoms. We did discuss that her symptoms are atypical and that further investigation may still be needed. She is in agreement.      Carola Rhine, PAC

## 2015-08-13 ENCOUNTER — Telehealth: Payer: Self-pay | Admitting: Neurology

## 2015-08-13 NOTE — Telephone Encounter (Signed)
Called and left voicemail on daughter's phone again to results of MRI.  Overall, improved metastatic lesions with no new lesions.  There is evidence of white matter changes, likely due to whole-brain radiation.  Requested patient's daughter to call back with clinical update since during her clinic visit we had discussed proceeding with CSF studies, if patient has not returned to her baseline.  Donika K. Posey Pronto, DO

## 2015-08-19 ENCOUNTER — Other Ambulatory Visit: Payer: Self-pay | Admitting: Radiation Therapy

## 2015-08-19 DIAGNOSIS — C7931 Secondary malignant neoplasm of brain: Secondary | ICD-10-CM

## 2015-08-19 DIAGNOSIS — C7949 Secondary malignant neoplasm of other parts of nervous system: Principal | ICD-10-CM

## 2015-09-29 ENCOUNTER — Ambulatory Visit (HOSPITAL_COMMUNITY)
Admission: RE | Admit: 2015-09-29 | Discharge: 2015-09-29 | Disposition: A | Discharge status: Home / Self Care | Payer: Medicare Other | Source: Ambulatory Visit | Attending: Internal Medicine | Admitting: Internal Medicine

## 2015-09-29 ENCOUNTER — Other Ambulatory Visit (HOSPITAL_COMMUNITY): Payer: Self-pay | Admitting: Internal Medicine

## 2015-09-29 DIAGNOSIS — R059 Cough, unspecified: Secondary | ICD-10-CM

## 2015-09-29 DIAGNOSIS — I700 Atherosclerosis of aorta: Secondary | ICD-10-CM | POA: Insufficient documentation

## 2015-09-29 DIAGNOSIS — J984 Other disorders of lung: Secondary | ICD-10-CM | POA: Diagnosis not present

## 2015-09-29 DIAGNOSIS — R05 Cough: Secondary | ICD-10-CM

## 2015-10-09 ENCOUNTER — Inpatient Hospital Stay (HOSPITAL_COMMUNITY)
Admission: EM | Admit: 2015-10-09 | Discharge: 2015-10-13 | DRG: 190 | Disposition: A | Discharge status: Home Health | Payer: Medicare Other | Attending: Internal Medicine | Admitting: Internal Medicine

## 2015-10-09 ENCOUNTER — Emergency Department (HOSPITAL_COMMUNITY): Payer: Medicare Other

## 2015-10-09 ENCOUNTER — Encounter (HOSPITAL_COMMUNITY): Payer: Self-pay | Admitting: Emergency Medicine

## 2015-10-09 DIAGNOSIS — C7989 Secondary malignant neoplasm of other specified sites: Secondary | ICD-10-CM | POA: Diagnosis present

## 2015-10-09 DIAGNOSIS — Z8249 Family history of ischemic heart disease and other diseases of the circulatory system: Secondary | ICD-10-CM

## 2015-10-09 DIAGNOSIS — Z841 Family history of disorders of kidney and ureter: Secondary | ICD-10-CM | POA: Diagnosis not present

## 2015-10-09 DIAGNOSIS — J42 Unspecified chronic bronchitis: Secondary | ICD-10-CM | POA: Diagnosis not present

## 2015-10-09 DIAGNOSIS — J440 Chronic obstructive pulmonary disease with acute lower respiratory infection: Secondary | ICD-10-CM | POA: Diagnosis present

## 2015-10-09 DIAGNOSIS — Y95 Nosocomial condition: Secondary | ICD-10-CM | POA: Diagnosis present

## 2015-10-09 DIAGNOSIS — N390 Urinary tract infection, site not specified: Secondary | ICD-10-CM | POA: Diagnosis present

## 2015-10-09 DIAGNOSIS — Z7189 Other specified counseling: Secondary | ICD-10-CM

## 2015-10-09 DIAGNOSIS — Z86711 Personal history of pulmonary embolism: Secondary | ICD-10-CM | POA: Diagnosis not present

## 2015-10-09 DIAGNOSIS — Z923 Personal history of irradiation: Secondary | ICD-10-CM

## 2015-10-09 DIAGNOSIS — R531 Weakness: Secondary | ICD-10-CM

## 2015-10-09 DIAGNOSIS — K219 Gastro-esophageal reflux disease without esophagitis: Secondary | ICD-10-CM | POA: Diagnosis present

## 2015-10-09 DIAGNOSIS — F329 Major depressive disorder, single episode, unspecified: Secondary | ICD-10-CM | POA: Diagnosis present

## 2015-10-09 DIAGNOSIS — E860 Dehydration: Secondary | ICD-10-CM | POA: Diagnosis present

## 2015-10-09 DIAGNOSIS — C7889 Secondary malignant neoplasm of other digestive organs: Secondary | ICD-10-CM | POA: Diagnosis present

## 2015-10-09 DIAGNOSIS — F419 Anxiety disorder, unspecified: Secondary | ICD-10-CM | POA: Diagnosis present

## 2015-10-09 DIAGNOSIS — I10 Essential (primary) hypertension: Secondary | ICD-10-CM | POA: Diagnosis present

## 2015-10-09 DIAGNOSIS — R1310 Dysphagia, unspecified: Secondary | ICD-10-CM

## 2015-10-09 DIAGNOSIS — C799 Secondary malignant neoplasm of unspecified site: Secondary | ICD-10-CM

## 2015-10-09 DIAGNOSIS — R05 Cough: Secondary | ICD-10-CM | POA: Diagnosis present

## 2015-10-09 DIAGNOSIS — E785 Hyperlipidemia, unspecified: Secondary | ICD-10-CM | POA: Diagnosis present

## 2015-10-09 DIAGNOSIS — Z9049 Acquired absence of other specified parts of digestive tract: Secondary | ICD-10-CM | POA: Diagnosis not present

## 2015-10-09 DIAGNOSIS — Z87891 Personal history of nicotine dependence: Secondary | ICD-10-CM

## 2015-10-09 DIAGNOSIS — C7931 Secondary malignant neoplasm of brain: Secondary | ICD-10-CM | POA: Diagnosis present

## 2015-10-09 DIAGNOSIS — Z820 Family history of epilepsy and other diseases of the nervous system: Secondary | ICD-10-CM | POA: Diagnosis not present

## 2015-10-09 DIAGNOSIS — E119 Type 2 diabetes mellitus without complications: Secondary | ICD-10-CM | POA: Diagnosis present

## 2015-10-09 DIAGNOSIS — J449 Chronic obstructive pulmonary disease, unspecified: Secondary | ICD-10-CM | POA: Diagnosis not present

## 2015-10-09 DIAGNOSIS — Z8701 Personal history of pneumonia (recurrent): Secondary | ICD-10-CM

## 2015-10-09 DIAGNOSIS — J441 Chronic obstructive pulmonary disease with (acute) exacerbation: Secondary | ICD-10-CM | POA: Diagnosis present

## 2015-10-09 DIAGNOSIS — Z9221 Personal history of antineoplastic chemotherapy: Secondary | ICD-10-CM

## 2015-10-09 DIAGNOSIS — Z825 Family history of asthma and other chronic lower respiratory diseases: Secondary | ICD-10-CM

## 2015-10-09 DIAGNOSIS — C73 Malignant neoplasm of thyroid gland: Secondary | ICD-10-CM | POA: Diagnosis present

## 2015-10-09 DIAGNOSIS — C7800 Secondary malignant neoplasm of unspecified lung: Secondary | ICD-10-CM | POA: Diagnosis present

## 2015-10-09 DIAGNOSIS — R059 Cough, unspecified: Secondary | ICD-10-CM | POA: Diagnosis present

## 2015-10-09 DIAGNOSIS — M797 Fibromyalgia: Secondary | ICD-10-CM | POA: Diagnosis present

## 2015-10-09 DIAGNOSIS — Z90710 Acquired absence of both cervix and uterus: Secondary | ICD-10-CM | POA: Diagnosis not present

## 2015-10-09 DIAGNOSIS — Z833 Family history of diabetes mellitus: Secondary | ICD-10-CM | POA: Diagnosis not present

## 2015-10-09 DIAGNOSIS — Z66 Do not resuscitate: Secondary | ICD-10-CM | POA: Diagnosis present

## 2015-10-09 DIAGNOSIS — J189 Pneumonia, unspecified organism: Secondary | ICD-10-CM | POA: Diagnosis present

## 2015-10-09 DIAGNOSIS — Z800 Family history of malignant neoplasm of digestive organs: Secondary | ICD-10-CM

## 2015-10-09 LAB — CBC WITH DIFFERENTIAL/PLATELET
BASOS ABS: 0 10*3/uL (ref 0.0–0.1)
BASOS PCT: 0 %
Eosinophils Absolute: 0 10*3/uL (ref 0.0–0.7)
Eosinophils Relative: 0 %
HEMATOCRIT: 49.6 % — AB (ref 36.0–46.0)
HEMOGLOBIN: 16.3 g/dL — AB (ref 12.0–15.0)
LYMPHS PCT: 10 %
Lymphs Abs: 0.7 10*3/uL (ref 0.7–4.0)
MCH: 26.2 pg (ref 26.0–34.0)
MCHC: 32.9 g/dL (ref 30.0–36.0)
MCV: 79.7 fL (ref 78.0–100.0)
Monocytes Absolute: 0.8 10*3/uL (ref 0.1–1.0)
Monocytes Relative: 14 %
NEUTROS ABS: 4.7 10*3/uL (ref 1.7–7.7)
Neutrophils Relative %: 76 %
Platelets: 313 10*3/uL (ref 150–400)
RBC: 6.22 MIL/uL — AB (ref 3.87–5.11)
RDW: 17.6 % — AB (ref 11.5–15.5)
WBC: 6.2 10*3/uL (ref 4.0–10.5)

## 2015-10-09 LAB — COMPREHENSIVE METABOLIC PANEL
ALBUMIN: 3.5 g/dL (ref 3.5–5.0)
ALK PHOS: 99 U/L (ref 38–126)
ALT: 15 U/L (ref 14–54)
ANION GAP: 11 (ref 5–15)
AST: 24 U/L (ref 15–41)
BILIRUBIN TOTAL: 0.6 mg/dL (ref 0.3–1.2)
BUN: 10 mg/dL (ref 6–20)
CALCIUM: 9.3 mg/dL (ref 8.9–10.3)
CO2: 20 mmol/L — ABNORMAL LOW (ref 22–32)
Chloride: 102 mmol/L (ref 101–111)
Creatinine, Ser: 0.79 mg/dL (ref 0.44–1.00)
GFR calc Af Amer: >60 mL/min (ref >60)
GLUCOSE: 115 mg/dL — AB (ref 65–99)
Potassium: 3.6 mmol/L (ref 3.5–5.1)
Sodium: 133 mmol/L — ABNORMAL LOW (ref 135–145)
TOTAL PROTEIN: 7.2 g/dL (ref 6.5–8.1)

## 2015-10-09 LAB — URINALYSIS, ROUTINE W REFLEX MICROSCOPIC
Bilirubin Urine: NEGATIVE
Glucose, UA: NEGATIVE mg/dL
Hgb urine dipstick: NEGATIVE
Ketones, ur: NEGATIVE mg/dL
Nitrite: NEGATIVE
PROTEIN: NEGATIVE mg/dL
SPECIFIC GRAVITY, URINE: 1.01 (ref 1.005–1.030)
pH: 5.5 (ref 5.0–8.0)

## 2015-10-09 LAB — URINE MICROSCOPIC-ADD ON: RBC / HPF: NONE SEEN RBC/hpf (ref 0–5)

## 2015-10-09 LAB — BRAIN NATRIURETIC PEPTIDE: B NATRIURETIC PEPTIDE 5: 31 pg/mL (ref 0.0–100.0)

## 2015-10-09 LAB — LACTIC ACID, PLASMA: LACTIC ACID, VENOUS: 1.8 mmol/L (ref 0.5–1.9)

## 2015-10-09 MED ORDER — BISACODYL 10 MG RE SUPP: 10.0000 mg | Freq: Every day | RECTAL | Status: DC | PRN

## 2015-10-09 MED ORDER — ONDANSETRON HCL 4 MG/2ML IJ SOLN: 4.0000 mg | Freq: Four times a day (QID) | INTRAMUSCULAR | Status: DC | PRN

## 2015-10-09 MED ORDER — OXYCODONE HCL 5 MG PO TABS
20.0000 mg | ORAL_TABLET | ORAL | Status: DC | PRN
  Administered 2015-10-09 – 2015-10-13 (×12): 20 mg via ORAL
  Filled 2015-10-09 (×12): qty 4

## 2015-10-09 MED ORDER — SODIUM CHLORIDE 0.9 % IV SOLN
INTRAVENOUS | Status: DC
Start: 2015-10-09 — End: 2015-10-12
  Administered 2015-10-09 – 2015-10-12 (×3): via INTRAVENOUS

## 2015-10-09 MED ORDER — PANTOPRAZOLE SODIUM 40 MG PO TBEC
40.0000 mg | DELAYED_RELEASE_TABLET | Freq: Two times a day (BID) | ORAL | Status: DC
  Administered 2015-10-09 – 2015-10-13 (×8): 40 mg via ORAL
  Filled 2015-10-09 (×8): qty 1

## 2015-10-09 MED ORDER — SODIUM CHLORIDE 0.9 % IV BOLUS (SEPSIS)
500.0000 mL | Freq: Once | INTRAVENOUS | Status: AC
  Administered 2015-10-09: 500 mL via INTRAVENOUS

## 2015-10-09 MED ORDER — LEVOTHYROXINE SODIUM 112 MCG PO TABS
112.0000 ug | ORAL_TABLET | Freq: Every day | ORAL | Status: DC
  Administered 2015-10-10 – 2015-10-13 (×4): 112 ug via ORAL
  Filled 2015-10-09 (×4): qty 1

## 2015-10-09 MED ORDER — LIDOCAINE VISCOUS 2 % MT SOLN: 5.0000 mL | Freq: Four times a day (QID) | OROMUCOSAL | Status: DC | PRN

## 2015-10-09 MED ORDER — LEVOFLOXACIN IN D5W 750 MG/150ML IV SOLN
750.0000 mg | Freq: Once | INTRAVENOUS | Status: AC
  Administered 2015-10-09: 750 mg via INTRAVENOUS
  Filled 2015-10-09: qty 150

## 2015-10-09 MED ORDER — ACETAMINOPHEN 325 MG PO TABS: 650.0000 mg | ORAL_TABLET | Freq: Four times a day (QID) | ORAL | Status: DC | PRN

## 2015-10-09 MED ORDER — MAGIC MOUTHWASH W/LIDOCAINE: 5.0000 mL | Freq: Four times a day (QID) | ORAL | Status: DC | PRN

## 2015-10-09 MED ORDER — ALBUTEROL SULFATE (2.5 MG/3ML) 0.083% IN NEBU: 2.5000 mg | INHALATION_SOLUTION | RESPIRATORY_TRACT | Status: DC | PRN

## 2015-10-09 MED ORDER — ENOXAPARIN SODIUM 40 MG/0.4ML ~~LOC~~ SOLN
40.0000 mg | SUBCUTANEOUS | Status: DC
  Administered 2015-10-10 – 2015-10-12 (×3): 40 mg via SUBCUTANEOUS
  Filled 2015-10-09 (×3): qty 0.4

## 2015-10-09 MED ORDER — MEGESTROL ACETATE 400 MG/10ML PO SUSP
400.0000 mg | Freq: Every day | ORAL | Status: DC
  Administered 2015-10-10 – 2015-10-13 (×4): 400 mg via ORAL
  Filled 2015-10-09 (×4): qty 10

## 2015-10-09 MED ORDER — VANCOMYCIN HCL IN DEXTROSE 1-5 GM/200ML-% IV SOLN
1000.0000 mg | Freq: Once | INTRAVENOUS | Status: AC
  Administered 2015-10-09: 1000 mg via INTRAVENOUS
  Filled 2015-10-09: qty 200

## 2015-10-09 MED ORDER — SERTRALINE HCL 50 MG PO TABS
50.0000 mg | ORAL_TABLET | Freq: Every day | ORAL | Status: DC
  Administered 2015-10-10: 50 mg via ORAL
  Filled 2015-10-09: qty 1

## 2015-10-09 MED ORDER — MAGIC MOUTHWASH: 5.0000 mL | Freq: Four times a day (QID) | ORAL | Status: DC | PRN

## 2015-10-09 MED ORDER — POTASSIUM CHLORIDE CRYS ER 20 MEQ PO TBCR
40.0000 meq | EXTENDED_RELEASE_TABLET | Freq: Two times a day (BID) | ORAL | Status: DC
  Administered 2015-10-09 – 2015-10-13 (×8): 40 meq via ORAL
  Filled 2015-10-09 (×9): qty 2

## 2015-10-09 MED ORDER — POLYETHYLENE GLYCOL 3350 17 G PO PACK: 17.0000 g | PACK | Freq: Every day | ORAL | Status: DC | PRN

## 2015-10-09 MED ORDER — VANCOMYCIN HCL IN DEXTROSE 750-5 MG/150ML-% IV SOLN
750.0000 mg | Freq: Two times a day (BID) | INTRAVENOUS | Status: DC
  Administered 2015-10-10 – 2015-10-12 (×5): 750 mg via INTRAVENOUS
  Filled 2015-10-09 (×7): qty 150

## 2015-10-09 MED ORDER — SODIUM CHLORIDE 0.9 % IV BOLUS (SEPSIS)
500.0000 mL | Freq: Once | INTRAVENOUS | Status: AC
  Administered 2015-10-09: 13:00:00 via INTRAVENOUS

## 2015-10-09 MED ORDER — ONDANSETRON HCL 4 MG PO TABS: 4.0000 mg | ORAL_TABLET | Freq: Four times a day (QID) | ORAL | Status: DC | PRN

## 2015-10-09 MED ORDER — ONDANSETRON 4 MG PO TBDP: 8.0000 mg | ORAL_TABLET | Freq: Three times a day (TID) | ORAL | Status: DC | PRN

## 2015-10-09 MED ORDER — RESOURCE THICKENUP CLEAR PO POWD
Freq: Every day | ORAL | Status: DC
Start: 2015-10-10 — End: 2015-10-13
  Administered 2015-10-10 – 2015-10-13 (×4): via ORAL
  Filled 2015-10-09: qty 125

## 2015-10-09 MED ORDER — IOPAMIDOL (ISOVUE-370) INJECTION 76%
100.0000 mL | Freq: Once | INTRAVENOUS | Status: AC | PRN
  Administered 2015-10-09: 100 mL via INTRAVENOUS

## 2015-10-09 NOTE — Progress Notes (Signed)
Pharmacy Antibiotic Note  Haley Lowe is a 60 y.o. female admitted on 10/09/2015 with pneumonia.  Pharmacy has been consulted for vancomycin dosing. One gram ordered in the ED  Plan: Continue vancomycin 750 mg IV q12 hours F/u renal function, cultures and clinical course  Height: '5\' 4"'$  (162.6 cm) Weight: 120 lb (54.432 kg) IBW/kg (Calculated) : 54.7  Temp (24hrs), Avg:97.8 F (36.6 C), Min:97.7 F (36.5 C), Max:97.8 F (36.6 C)   Recent Labs Lab 10/09/15 1255  WBC 6.2  CREATININE 0.79  LATICACIDVEN 1.8    Estimated Creatinine Clearance: 65 mL/min (by C-G formula based on Cr of 0.79).    Allergies  Allergen Reactions  . Penicillins Swelling    " my brain swelled, and mymouth" Has patient had a PCN reaction causing immediate rash, facial/tongue/throat swelling, SOB or lightheadedness with hypotension: Yes Has patient had a PCN reaction causing severe rash involving mucus membranes or skin necrosis: No Has patient had a PCN reaction that required hospitalization No Has patient had a PCN reaction occurring within the last 10 years: No If all of the above answers are "NO", then may proceed with Cephalosporin use.   . Other Swelling    PT ALSO REACTS TO PAPER TAPE AND BAND-AIDS.  Marland Kitchen Codeine Itching    Mild takes benadryl  . Potassium-Containing Compounds Other (See Comments)    Potassium Liquid only- burns the mouth and throat, pt is able to take tablets dissolved.  . Tape Rash    Antimicrobials this admission: vanc 7/6 >>  levaquin 7/6 >>     Thank you for allowing pharmacy to be a part of this patient's care.  Excell Seltzer Poteet 10/09/2015 8:20 PM

## 2015-10-09 NOTE — H&P (Signed)
[facility] History and Physical  [name] MBT:[id number] DOB: 1956-03-04 DOA: 10/09/2015  Referring physician: Dr. Lita Mains PCP: [name], MD   Chief Complaint: Cough, weak, not eating  HPI: Haley Lowe is a 60 y.o. female with history of metastatic thyroid cancer presenting with increased productive cough, gen weakness, poor po intake for several days.  Unable to get OOB now.  She was dx'd with PNA no 6/26 and had been getting every other day Ceftriaxone at [facility] [place] through 6/30 w/o relief.  Today was seen by oncology and they sent her to ED for hospital admission.    Denies sig chest pain. Has chronic severe neck pain from multiple surgeries she has had for resection of nodes/ thyroid cancer.  Had brain tumor resection in 2013, that was first surgery. Then got chemoRx at [facility] w unknown primary. Now is  F/B [facility] oncology primarily.  Had admit here last Nov w complications of chemo/XRT w dyphagia, severe esophagitis and radiation mucositis w HCAP.  Chemo has been off and on for the last year (one pill daily), currently on hold x 1 day.  Then was here in Jan twice for aspiration PNA, and here in FEb once w thrush, dehydration and recurrent PNA.    Denies any diarrhea, n/v or abd pain.  No dysuria or urinary frequency.  No joint pain or rash.  Legs have had mottled appearance recently.    Jun 13 - resection brain met Apr 16 - chest pain, r/o Nov 16 - HSV/ yeast esophagitis, radiation mucositis on chemo/ XRT for met thyroid Ca. HCAP. Chemo put on hold Dec 16 - acute resp failure/ COPD, pancytopnenia from chemo Jan 17 - asp PNA, hx dysphagia Jan 17 - recur PNA (asp vs HCAP) Feb 17 - weak /dizzy, +thrush and HCAP vs recur asp PNA  ROS  denies CP  no joint pain   no HA  no blurry vision  no rash  no diarrhea  no nausea/ vomiting  no dysuria  no difficulty voiding  no change in urine color    Past Medical History  Past Medical History   Diagnosis Date  . Essential hypertension   . Anxiety   . Depression   . COPD (chronic obstructive pulmonary disease) ([place])   . GERD (gastroesophageal reflux disease)   . Headache(784.0)   . Arthritis   . Hx of adenomatous colonic polyps   . Emphysema   . Cataract     1 lens implaqnt right eye,intact on left eye  . Goiter   . Fibromyalgia   . Pulmonary emboli ([place]) 11/04/11    Right upper lobe and r lower lobe PE  . Lung nodule seen on imaging study 11/04/11 CT    70m LLL  . DVT (deep venous thrombosis) ([facility] 12/09/2011  . Pulmonary embolism ([facility] 12/09/2011  . Hyperlipidemia   . History of radiation therapy 10/06/11    SRS 15Gy 175f brain  . UTI (lower urinary tract infection)   . Borderline diabetes mellitus   . Metastatic adenocarcinoma to brain ([facility]6/12/13    Left frontoparietal region  . Thyroid cancer ([facility]2015  . S/P radiation therapy 05/31/14 SRS    Brain  . PONV (postoperative nausea and vomiting)   . Controlled diabetes mellitus type II without complication ([id number]  Previously diagnosed with borderline diabetes.  . Marland Kitchenerpes simplex esophagitis 03/11/2015    Also candidal esophagitis  . HCAP (healthcare-associated pneumonia) 03/03/2015  . Mucositis due to  radiation therapy 03/03/2015   Past Surgical History  Past Surgical History  Procedure Laterality Date  . Abdominal hysterectomy  1996  . Cholecystectomy  2000  . Shoulder surgery Right 1998  . Eye surgery  2001  . Foot surgery Left 2005  . Craniotomy  09/15/2011    Procedure: CRANIOTOMY TUMOR EXCISION;  Surgeon: Hosie Spangle, MD;  Location: [place] NEURO ORS;  Service: Neurosurgery;  Laterality: N/A;  Craniotomy resection of tumor with stealth  . Cataract extraction  2011    With lens implant  . Total thyroidectomy  10-29-2013    [facility]  . Lymph gland excision    . Video bronchoscopy with endobronchial ultrasound N/A 04/11/2014    Procedure: VIDEO BRONCHOSCOPY WITH  ENDOBRONCHIAL ULTRASOUND;  Surgeon: Melrose Nakayama, MD;  Location: [place];  Service: Thoracic;  Laterality: N/A;  . Esophagogastroduodenoscopy (egd) with propofol N/A 03/07/2015    Procedure: ESOPHAGOGASTRODUODENOSCOPY (EGD) WITH PROPOFOL;  Surgeon: Daneil Dolin, MD;  [facility];  Service: Endoscopy;  Laterality: N/A;  . Biopsy  03/07/2015    Procedure: BIOPSY;  Surgeon: Daneil Dolin, MD;  Location: [facility];  Service: Endoscopy;;   Family History  Family History  Problem Relation Age of Onset  . Asthma Mother   . Kidney failure Father   . Diabetes Sister   . Heart attack Sister   . Colon cancer Brother   . COPD Sister   . Aneurysm Paternal Grandmother     Brain  . Parkinsonism Maternal Uncle   . COPD Brother    Social History  reports that she quit smoking about 4 years ago. Her smoking use included Cigarettes. She has a 40 pack-year smoking history. She has never used smokeless tobacco. She reports that she does not drink alcohol or use illicit drugs. Allergies  Allergies  Allergen Reactions  . Penicillins Swelling    " my brain swelled, and mymouth" Has patient had a PCN reaction causing immediate rash, facial/tongue/throat swelling, SOB or lightheadedness with hypotension: Yes Has patient had a PCN reaction causing severe rash involving mucus membranes or skin necrosis: No Has patient had a PCN reaction that required hospitalization No Has patient had a PCN reaction occurring within the last 10 years: No If all of the above answers are "NO", then may proceed with Cephalosporin use.   . Other Swelling    PT ALSO REACTS TO PAPER TAPE AND BAND-AIDS.  Marland Kitchen Codeine Itching    Mild takes benadryl  . Potassium-Containing Compounds Other (See Comments)    Potassium Liquid only- burns the mouth and throat, pt is able to take tablets dissolved.  . Tape Rash   Home medications Prior to Admission medications   Medication Sig Start Date End Date Taking?  Authorizing Provider  acetaminophen (TYLENOL) 325 MG tablet Take 2 tablets (650 mg total) by mouth every 6 (six) hours as needed for mild pain. 05/10/15  Yes Lavina Hamman, MD  albuterol (PROVENTIL) (2.5 MG/3ML) 0.083% nebulizer solution Take 3 mLs (2.5 mg total) by nebulization every 4 (four) hours as needed for wheezing or shortness of breath. 04/25/15  Yes Orvan Falconer, MD  levothyroxine (SYNTHROID, LEVOTHROID) 112 MCG tablet Take 112 mcg by mouth daily before breakfast.   Yes Historical Provider, MD  magic mouthwash w/lidocaine SOLN Take 5 mLs by mouth 4 (four) times daily as needed for mouth pain. 03/13/15  Yes Velvet Bathe, MD  megestrol (MEGACE) 400 MG/10ML suspension Take 10 mLs (  400 mg total) by mouth daily. 05/10/15  Yes Lavina Hamman, MD  ondansetron (ZOFRAN ODT) 8 MG disintegrating tablet Take 1 tablet (8 mg total) by mouth every 8 (eight) hours as needed for nausea or vomiting. 04/05/14  Yes Pattricia Boss, MD  Oxycodone HCl 20 MG TABS Take 1 tablet by mouth. Every 1 to 2 hours.   Yes Historical Provider, MD  pantoprazole (PROTONIX) 40 MG tablet Take 1 tablet (40 mg total) by mouth 2 (two) times daily. 03/13/15  Yes Velvet Bathe, MD  polyethylene glycol (MIRALAX / GLYCOLAX) packet Take 17 g by mouth daily as needed for moderate constipation. Reported on 04/27/2015   Yes Historical Provider, MD  potassium chloride SA (K-DUR,KLOR-CON) 20 MEQ tablet Take 40 mEq by mouth 2 (two) times daily. Dissolve in water and take either in applesauce or drink the water.   Yes Historical Provider, MD  sertraline (ZOLOFT) 50 MG tablet Take 50 mg by mouth daily.   Yes Historical Provider, MD  Starch-Maltodextrin (THICK-IT PO) Take by mouth daily. Add to all liquid.   Yes Historical Provider, MD   Liver Function Tests  Recent Labs Lab 10/09/15 1255  AST 24  ALT 15  ALKPHOS 99  BILITOT 0.6  PROT 7.2  ALBUMIN 3.5   No results for input(s): LIPASE, AMYLASE in the last 168 hours. CBC  Recent Labs Lab  10/09/15 1255  WBC 6.2  NEUTROABS 4.7  HGB 16.3*  HCT 49.6*  MCV 79.7  PLT 846   Basic Metabolic Panel  Recent Labs Lab 10/09/15 1255  NA 133*  K 3.6  CL 102  CO2 20*  GLUCOSE 115*  BUN 10  CREATININE 0.79  CALCIUM 9.3     Filed Vitals:   10/09/15 1610 10/09/15 1719 10/09/15 1730 10/09/15 1750  BP:  121/83 105/76   Pulse: 104 107 100 107  Temp:      TempSrc:      Resp: _0 Height:      Weight:      SpO2: 96% 96% 96% 94%   Exam: Gen chron ill appearing, no distress No rash, cyanosis or gangrene Sclera anicteric, throat clear  No jvd or bruits, sig tender scarring bilat neck from prior surg Chest coarse rhonchit bilat , coughing, no wheezing RRR no MRG Abd soft ntnd no mass or ascites +bs GU deferred MS no joint effusions or deformity, livedo reticularis skin pattern in LE's Ext no LE or UE edema / no wounds or ulcers, muscle wasting LE's Neuro is alert, Ox 3 , nf, gen weak throughout  Na 133 K 3.6 Creat 0.79   WBC 6k  Hb 16   UA 6-30 wbc/ epi's, no rbc, rare bact  CT chest >  1. No pulmonary embolism. 2. New confluent subcarinal and bilateral hilar adenopathy, causing extrinsic narrowing of the bilateral central bronchi, worrisome for metastatic adenopathy. 3. Increased diffuse peribronchovascular interstitial thickening in both lungs, worrisome for lymphangitic tumor . 4. New mild patchy perilobular ground-glass opacities and small patchy nodular areas of consolidation in the bilateral peripheral lungs, probably inflammatory 6. Increased bilateral retrocrural and gastrohepatic ligament calcified adenopathy, most consistent with metastatic thyroid carcinoma adenopathy. 7. Moderate centrilobular emphysema.   Assessment: 1.  Cough, r/o PNA vs lymphangitic spread - admit, nebs, IV abx 2.  Vol depletion / dehydration - poor po intake multifact 3.  Dysphagia, chronic after radiation to chest - takes dysphagia diet/ blended, all liquids w  ThickIt 4.  Metastatic  thyroid cancer - health declining last 4-5 mos, pt wants DNR status but not ready for hospice. Cancer worsening by CT chest today. Could have lymphangitic spread in the lungs 5.  COPD 6.  Anxiety/ depression 7.  Pyuria, f/u urine cx  Plan - as above     [place] D [facility] Pager [phone]  Cell [phone]  If 7PM-7AM, please contact night-coverage [email or web address] Password [email or web address] 10/09/2015, 7:17 PM

## 2015-10-09 NOTE — ED Notes (Signed)
Pt sent down from cancer center, husband states she was diagnosed with pneumonia on Monday, Treatment has ceftriaxone injection last 3 days at [facility]

## 2015-10-09 NOTE — ED Provider Notes (Signed)
CSN: [id number]     Arrival date & time 10/09/15  1215 History  By signing my name below, I, Jasmyn B. Alexander, attest that this documentation has been prepared under the direction and in the presence of Julianne Rice, MD.  Electronically Signed: Tedra Coupe. Sheppard Coil, ED Scribe. 10/09/2015. 1:06 PM.   Chief Complaint  Patient presents with  . Pneumonia    The history is provided by the spouse. No language interpreter was used.   LEVEL 5 CAVEAT - UNABLE TO SPEAK HPI Comments:  Sevyn Markham is a 60 y.o. female with PMHx of Thyroid Cancer, HTN, and COPD who presents to the Emergency Department complaining of persistent pneumonia symptoms x 1.5 weeks. Per pt's husband, he reports that pt's oncologist at [facility] was concerned about her symptoms and told her she needs to be admitted to hospital for treatment of PNA and malnutrition. Pt has associated productive cough. Pt's husband notes she was diagnosed with Pneumonia on 09/29/15 and was given Ceftriazone injection on 6/26, 6/28, and 6/30 at [facility] with no relief of symptoms. At baseline pt is unable to walk without assistance but has had increased generalized weakness. No nausea, vomiting or diarrhea. No chest pain.  Past Medical History  Diagnosis Date  . Essential hypertension   . Anxiety   . Depression   . COPD (chronic obstructive pulmonary disease) ([place])   . GERD (gastroesophageal reflux disease)   . Headache(784.0)   . Arthritis   . Hx of adenomatous colonic polyps   . Emphysema   . Cataract     1 lens implaqnt right eye,intact on left eye  . Goiter   . Fibromyalgia   . Pulmonary emboli ([place]) 11/04/11    Right upper lobe and r lower lobe PE  . Lung nodule seen on imaging study 11/04/11 CT    65m LLL  . DVT (deep venous thrombosis) ([facility] 12/09/2011  . Pulmonary embolism ([facility] 12/09/2011  . Hyperlipidemia   . History of radiation therapy 10/06/11    SRS 15Gy 160f brain  . UTI (lower urinary tract infection)   .  Borderline diabetes mellitus   . Metastatic adenocarcinoma to brain ([facility]/12/13    Left frontoparietal region  . Thyroid cancer ([facility]  . S/P radiation therapy 05/31/14 SRS    Brain  . PONV (postoperative nausea and vomiting)   . Controlled diabetes mellitus type II without complication ([id number]  Previously diagnosed with borderline diabetes.  . Marland Kitchenerpes simplex esophagitis 03/11/2015    Also candidal esophagitis  . HCAP (healthcare-associated pneumonia) 03/03/2015  . Mucositis due to radiation therapy 03/03/2015   Past Surgical History  Procedure Laterality Date  . Abdominal hysterectomy  1996  . Cholecystectomy  2000  . Shoulder surgery Right 1998  . Eye surgery  2001  . Foot surgery Left 2005  . Craniotomy  09/15/2011    Procedure: CRANIOTOMY TUMOR EXCISION;  Surgeon: RoHosie SpangleMD;  [facility];  Service: Neurosurgery;  Laterality: N/A;  Craniotomy resection of tumor with stealth  . Cataract extraction  2011    With lens implant  . [facility]    . Video bronchoscopy with endobronchial ultrasound N/A 04/11/2014    Procedure: VIDEO BRONCHOSCOPY WITH ENDOBRONCHIAL ULTRASOUND;  Surgeon: StMelrose NakayamaMD;  [facility] Service: Thoracic;  Laterality: N/A;  . Esophagogastroduodenoscopy (egd) with propofol N/A 03/07/2015  Procedure: ESOPHAGOGASTRODUODENOSCOPY (EGD) WITH PROPOFOL;  Surgeon: Daneil Dolin, MD;  Location: [facility];  Service: Endoscopy;  Laterality: N/A;  . Biopsy  03/07/2015    Procedure: BIOPSY;  Surgeon: Daneil Dolin, MD;  Location: [facility];  Service: Endoscopy;;   Family History  Problem Relation Age of Onset  . Asthma Mother   . Kidney failure Father   . Diabetes Sister   . Heart attack Sister   . Colon cancer Brother   . COPD Sister   . Aneurysm Paternal Grandmother     Brain  . Parkinsonism Maternal Uncle   . COPD  Brother    Social History  Substance Use Topics  . Smoking status: Former Smoker -- 1.00 packs/day for 40 years    Types: Cigarettes    Quit date: 04/06/2011  . Smokeless tobacco: Never Used  . Alcohol Use: No   OB History    No data available     Review of Systems  Constitutional: Positive for activity change. Negative for fever.  Respiratory: Positive for cough.   Cardiovascular: Negative for chest pain and leg swelling.  Gastrointestinal: Negative for nausea, vomiting, abdominal pain and diarrhea.  Skin: Negative for rash.  Neurological: Positive for weakness (generalized).  All other systems reviewed and are negative.     Allergies  Penicillins; Other; Codeine; Potassium-containing compounds; and Tape  Home Medications   Prior to Admission medications   Medication Sig Start Date End Date Taking? Authorizing Provider  acetaminophen (TYLENOL) 325 MG tablet Take 2 tablets (650 mg total) by mouth every 6 (six) hours as needed for mild pain. 05/10/15  Yes Lavina Hamman, MD  albuterol (PROVENTIL) (2.5 MG/3ML) 0.083% nebulizer solution Take 3 mLs (2.5 mg total) by nebulization every 4 (four) hours as needed for wheezing or shortness of breath. 04/25/15  Yes Orvan Falconer, MD  levothyroxine (SYNTHROID, LEVOTHROID) 112 MCG tablet Take 112 mcg by mouth daily before breakfast.   Yes Historical Provider, MD  magic mouthwash w/lidocaine SOLN Take 5 mLs by mouth 4 (four) times daily as needed for mouth pain. 03/13/15  Yes Velvet Bathe, MD  megestrol (MEGACE) 400 MG/10ML suspension Take 10 mLs (400 mg total) by mouth daily. 05/10/15  Yes Lavina Hamman, MD  ondansetron (ZOFRAN ODT) 8 MG disintegrating tablet Take 1 tablet (8 mg total) by mouth every 8 (eight) hours as needed for nausea or vomiting. 04/05/14  Yes Pattricia Boss, MD  Oxycodone HCl 20 MG TABS Take 1 tablet by mouth. Every 1 to 2 hours.   Yes Historical Provider, MD  pantoprazole (PROTONIX) 40 MG tablet Take 1 tablet (40 mg total) by  mouth 2 (two) times daily. 03/13/15  Yes Velvet Bathe, MD  polyethylene glycol (MIRALAX / GLYCOLAX) packet Take 17 g by mouth daily as needed for moderate constipation. Reported on 04/27/2015   Yes Historical Provider, MD  potassium chloride SA (K-DUR,KLOR-CON) 20 MEQ tablet Take 40 mEq by mouth 2 (two) times daily. Dissolve in water and take either in applesauce or drink the water.   Yes Historical Provider, MD  sertraline (ZOLOFT) 50 MG tablet Take 50 mg by mouth daily.   Yes Historical Provider, MD  Starch-Maltodextrin (THICK-IT PO) Take by mouth daily. Add to all liquid.   Yes Historical Provider, MD   BP 105/76 mmHg  Pulse 107  Temp(Src) 97.8 F (36.6 C) (Oral)  Resp 19  Ht '5\' 4"'$  (1.626 m)  Wt 120 lb (54.432 kg)  BMI 20.59 kg/m2  SpO2 94% Physical Exam  Constitutional: She appears well-developed. No distress.  Dry appearing  HENT:  Head: Normocephalic and atraumatic.  Dry mucous membranes  Eyes: EOM are normal. Pupils are equal, round, and reactive to light.  Neck: Normal range of motion. Neck supple.  Cardiovascular: Regular rhythm.   Tachycardia  Pulmonary/Chest: Effort normal. No respiratory distress. She has no wheezes. She has rales.  Bilateral rales  Abdominal: Soft. Bowel sounds are normal. She exhibits no distension and no mass. There is no tenderness. There is no rebound and no guarding.  Musculoskeletal: Normal range of motion. She exhibits no edema or tenderness.  No calf swelling, tenderness or asymmetry. Distal pulses are intact.  Neurological: She is alert.  Patient is awake and following commands. She is not speaking. Moves all extremities. Sensation is grossly intact.  Skin: Skin is warm and dry. No rash noted. No erythema.  Psychiatric:  Flat affect  Nursing note and vitals reviewed.   ED Course  Procedures (including critical care time) DIAGNOSTIC STUDIES: Oxygen Saturation is 95% on 2L/min via [place], adequate by my interpretation.    COORDINATION OF  CARE: 12:37 PM-Discussed treatment plan which includes order of CBC, CMP, UA, Lactic Acid lab, EKG, and CXR with pt and pt's husband at bedside and pt and pt's husband agreed to plan. Will order Levaquin and Vancomycin.  Labs Review Labs Reviewed  CBC WITH DIFFERENTIAL/PLATELET - Abnormal; Notable for the following:    RBC 6.22 (*)    Hemoglobin 16.3 (*)    HCT 49.6 (*)    RDW 17.6 (*)    All other components within normal limits  COMPREHENSIVE METABOLIC PANEL - Abnormal; Notable for the following:    Sodium 133 (*)    CO2 20 (*)    Glucose, Bld 115 (*)    All other components within normal limits  URINALYSIS, ROUTINE W REFLEX MICROSCOPIC (NOT AT [facility]) - Abnormal; Notable for the following:    Leukocytes, UA SMALL (*)    All other components within normal limits  URINE MICROSCOPIC-ADD ON - Abnormal; Notable for the following:    Squamous Epithelial / LPF 6-30 (*)    Bacteria, UA RARE (*)    All other components within normal limits  CULTURE, BLOOD (ROUTINE X 2)  CULTURE, BLOOD (ROUTINE X 2)  URINE CULTURE  BRAIN NATRIURETIC PEPTIDE  LACTIC ACID, PLASMA    Imaging Review Ct Angio Chest Pe W/cm &/or Wo Cm  10/09/2015  CLINICAL DATA:  Shortness of breath.  Thyroid cancer. EXAM: CT ANGIOGRAPHY CHEST WITH CONTRAST TECHNIQUE: Multidetector CT imaging of the chest was performed using the standard protocol during bolus administration of intravenous contrast. Multiplanar CT image reconstructions and MIPs were obtained to evaluate the vascular anatomy. CONTRAST:  100 cc Isovue 370 IV. COMPARISON:  Chest radiograph from earlier today. 04/28/2015 chest CT angiogram. FINDINGS: Mediastinum/Nodes: The study is moderate quality for the evaluation of pulmonary embolism, with evaluation of the subsegmental pulmonary arteries limited by motion artifact. There are no filling defects in the central, lobar, segmental or subsegmental pulmonary artery branches to suggest acute pulmonary embolism.  Atherosclerotic nonaneurysmal thoracic aorta. Main pulmonary diameter 3.0 cm is within normal limits. Normal heart size. Small pericardial effusion/ thickening measuring up to 7 mm thickness, not appreciably changed. Surgically absent thyroid. Unremarkable esophagus. No axillary adenopathy. New enlarged 2.1 cm right subcarinal node (series 4/ image 66). New mild confluent bilateral hilar adenopathy measuring up to 1.0 cm on the right (series 4/ image 57) and  1.2 cm on the left (series 4/ image 54). Lungs/Pleura: No pneumothorax. No pleural effusion. There is extrinsic narrowing of the bilateral mainstem and lobar bronchi by the confluent subcarinal and bilateral hilar adenopathy. There is increased diffuse peribronchovascular interstitial thickening throughout both lungs. Moderate centrilobular emphysema. There are new mild patchy perilobular ground-glass opacities and small patchy nodular areas of consolidation in the bilateral peripheral lungs, most prominent in the dependent lower lobes, for example a 1.1 cm nodular focus of consolidation in the posterior right lower lobe (series 6/ image 84). Tiny right upper lobe 3 mm pulmonary nodules are stable. Upper abdomen: New fat stranding in the upper omental fat. Bilateral retrocrural lymphadenopathy is increased, with enlarged calcified 1.4 cm right retrocrural node (series 4/ image 111), previously 0.9 cm and enlarged calcified 1.1 cm left retrocrural node, previously 1.0 cm. Calcified gastrohepatic ligament lymphadenopathy is increased, for example a 1.0 cm calcified gastrohepatic ligament node (series 4/ image 115), increased from 0.7 cm. Cholecystectomy. Musculoskeletal: No aggressive appearing focal osseous lesions. Nondisplaced healing anterior right second through sixth rib fractures. Mild thoracic spondylosis. Review of the MIP images confirms the above findings. IMPRESSION: 1. No pulmonary embolism. 2. New confluent subcarinal and bilateral hilar adenopathy,  causing extrinsic narrowing of the bilateral central bronchi, worrisome for metastatic adenopathy. 3. Increased diffuse peribronchovascular interstitial thickening in both lungs, worrisome for lymphangitic tumor . 4. New mild patchy perilobular ground-glass opacities and small patchy nodular areas of consolidation in the bilateral peripheral lungs, probably inflammatory. 5. Stable small pericardial effusion/thickening . 6. Increased bilateral retrocrural and gastrohepatic ligament calcified adenopathy, most consistent with metastatic thyroid carcinoma adenopathy. 7. Healing subacute anterior right second through sixth rib fractures. 8. Moderate centrilobular emphysema. 9. Aortic atherosclerosis. Electronically Signed   By: Ilona Sorrel M.D.   On: 10/09/2015 17:39   Dg Chest Port 1 View  10/09/2015  CLINICAL DATA:  Cough. History of thyroid carcinoma. Recent pneumonia. EXAM: PORTABLE CHEST 1 VIEW COMPARISON:  September 29, 2015 FINDINGS: There is stable fibrotic type change in the mid and lower lung zones, somewhat more pronounced on the right than on the left. There is no new opacity. No frank edema or consolidation. Heart size and pulmonary vascularity are normal. No adenopathy. There is atherosclerotic calcification in the aorta. Trachea is midline. No bone lesions evident. IMPRESSION: Stable fibrotic type change in the mid and lower lung zones, more pronounced on the right than on the left, stable. No new opacity. Stable cardiac silhouette. Aortic atherosclerosis noted. Electronically Signed   By: Lowella Grip III M.D.   On: 10/09/2015 12:58   I have personally reviewed and evaluated these images and lab results as part of my medical decision-making.   EKG Interpretation   Date/Time:  Thursday October 09 2015 12:26:16 EDT Ventricular Rate:  106 PR Interval:    QRS Duration: 73 QT Interval:  303 QTC Calculation: 403 R Axis:   -57 Text Interpretation:  Sinus tachycardia Inferior infarct, old Anterior   infarct, old Lateral leads are also involved Confirmed by Lita Mains  MD,  Clarity Ciszek ([id number]) on 10/09/2015 12:58:06 PM      MDM   Final diagnoses:  Generalized weakness  Metastatic disease ([place])    I personally performed the services described in this documentation, which was scribed in my presence. The recorded information has been reviewed and is accurate.   Inflammatory change and worsening metastatic disease on CT angio chest. Initiated broad-spectrum antibiotics and discussed with hospitalist. Will admit  Julianne Rice, MD 10/09/15  1811 

## 2015-10-10 DIAGNOSIS — J189 Pneumonia, unspecified organism: Secondary | ICD-10-CM

## 2015-10-10 DIAGNOSIS — E860 Dehydration: Secondary | ICD-10-CM

## 2015-10-10 DIAGNOSIS — J42 Unspecified chronic bronchitis: Secondary | ICD-10-CM

## 2015-10-10 DIAGNOSIS — R05 Cough: Secondary | ICD-10-CM

## 2015-10-10 LAB — URINE CULTURE

## 2015-10-10 LAB — CBC
HEMATOCRIT: 42 % (ref 36.0–46.0)
Hemoglobin: 13.3 g/dL (ref 12.0–15.0)
MCH: 25.4 pg — AB (ref 26.0–34.0)
MCHC: 31.7 g/dL (ref 30.0–36.0)
MCV: 80.2 fL (ref 78.0–100.0)
Platelets: 264 10*3/uL (ref 150–400)
RBC: 5.24 MIL/uL — ABNORMAL HIGH (ref 3.87–5.11)
RDW: 18 % — AB (ref 11.5–15.5)
WBC: 6.4 10*3/uL (ref 4.0–10.5)

## 2015-10-10 LAB — BASIC METABOLIC PANEL
Anion gap: 8 (ref 5–15)
BUN: 11 mg/dL (ref 6–20)
CHLORIDE: 109 mmol/L (ref 101–111)
CO2: 20 mmol/L — AB (ref 22–32)
Calcium: 8.3 mg/dL — ABNORMAL LOW (ref 8.9–10.3)
Creatinine, Ser: 0.77 mg/dL (ref 0.44–1.00)
GFR calc Af Amer: >60 mL/min (ref >60)
GFR calc non Af Amer: >60 mL/min (ref >60)
GLUCOSE: 96 mg/dL (ref 65–99)
POTASSIUM: 3.8 mmol/L (ref 3.5–5.1)
SODIUM: 137 mmol/L (ref 135–145)

## 2015-10-10 MED ORDER — GUAIFENESIN-DM 100-10 MG/5ML PO SYRP
5.0000 mL | ORAL_SOLUTION | ORAL | Status: DC
  Administered 2015-10-10 – 2015-10-13 (×16): 5 mL via ORAL
  Filled 2015-10-10 (×17): qty 5

## 2015-10-10 MED ORDER — DIPHENHYDRAMINE HCL 25 MG PO CAPS
25.0000 mg | ORAL_CAPSULE | Freq: Once | ORAL | Status: AC
  Administered 2015-10-10: 25 mg via ORAL
  Filled 2015-10-10: qty 1

## 2015-10-10 MED ORDER — SERTRALINE HCL 50 MG PO TABS
50.0000 mg | ORAL_TABLET | Freq: Once | ORAL | Status: AC
  Administered 2015-10-10: 50 mg via ORAL
  Filled 2015-10-10: qty 1

## 2015-10-10 MED ORDER — SODIUM CHLORIDE 0.9 % IV SOLN
250.0000 mg | Freq: Four times a day (QID) | INTRAVENOUS | Status: DC
  Administered 2015-10-10 – 2015-10-12 (×8): 250 mg via INTRAVENOUS
  Filled 2015-10-10 (×13): qty 250

## 2015-10-10 MED ORDER — SERTRALINE HCL 50 MG PO TABS
100.0000 mg | ORAL_TABLET | Freq: Every day | ORAL | Status: DC
  Administered 2015-10-11 – 2015-10-13 (×3): 100 mg via ORAL
  Filled 2015-10-10 (×3): qty 2

## 2015-10-10 NOTE — Care Management Note (Signed)
Case Management Note  Patient Details  Name: Haley Lowe MRN: [id number] Date of Birth: 12-24-1955  Subjective/Objective: Patient admitted from home with poss. PNA. Currently has thyroid cancer with mets. Currently active with Home health. Has walker, cane and oxygen at home. Husband at bedside.                  Action/Plan: Anticipate d/c home with home health services resumed. No other CM needs.    Expected Discharge Date:        7/72017          Expected Discharge Plan:  [place]  In-House Referral:  NA  Discharge planning Services  CM Consult  Post Acute Care Choice:  Resumption of Svcs/PTA Provider Choice offered to:     DME Arranged:    DME Agency:     HH Arranged:    [place] Agency:     Status of Service:  Completed, signed off  If discussed at [employer], dates discussed:    Additional Comments:  [name], Chauncey Reading, RN 10/10/2015, 2:18 PM

## 2015-10-10 NOTE — Progress Notes (Signed)
Pharmacy Antibiotic Note  Haley Lowe is a 60 y.o. female admitted on 10/09/2015 with pneumonia.  Pharmacy has been consulted for primaxin dosing.  Plan: Primaxin 250 mg IV q6 hours F/u renal function, cultures and clinical course  Height: '5\' 4"'$  (162.6 cm) Weight: 120 lb (54.432 kg) IBW/kg (Calculated) : 54.7  Temp (24hrs), Avg:97.8 F (36.6 C), Min:97.7 F (36.5 C), Max:98 F (36.7 C)   Recent Labs Lab 10/09/15 1255 10/10/15 0433  WBC 6.2 6.4  CREATININE 0.79 0.77  LATICACIDVEN 1.8  --     Estimated Creatinine Clearance: 65 mL/min (by C-G formula based on Cr of 0.77).    Allergies  Allergen Reactions  . Penicillins Swelling    " my brain swelled, and mymouth" Has patient had a PCN reaction causing immediate rash, facial/tongue/throat swelling, SOB or lightheadedness with hypotension: Yes Has patient had a PCN reaction causing severe rash involving mucus membranes or skin necrosis: No Has patient had a PCN reaction that required hospitalization No Has patient had a PCN reaction occurring within the last 10 years: No If all of the above answers are "NO", then may proceed with Cephalosporin use.   . Other Swelling    PT ALSO REACTS TO PAPER TAPE AND BAND-AIDS.  Marland Kitchen Codeine Itching    Mild takes benadryl  . Potassium-Containing Compounds Other (See Comments)    Potassium Liquid only- burns the mouth and throat, pt is able to take tablets dissolved.  . Tape Rash    Antimicrobials this admission: primaxin 7/7 >>  vancomycin 7/6 >>    Thank you for allowing pharmacy to be a part of this patient's care.  Excell Seltzer [place] 10/10/2015 10:21 AM

## 2015-10-10 NOTE — Progress Notes (Addendum)
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 female with history of metastatic thyroid cancer presenting with increased productive cough, gen weakness, poor po intake for several days. Unable to get OOB now. She was dx'd with PNA no 6/26 and had been getting every other day Ceftriaxone at [facility] through 6/30 w/o relief. Today was seen by oncology and they sent her to ED for hospital admission   Assessment and plan  1. Cough, CT scan showed no PE, hilar lymphadenopathy causing extrinsic narrowing of bilateral central bronchi worrisome for metastatic adenopathy, CT scan worrisome for lymphangitic tumor spread, new mild patchy groundglass opacities, centrilobular emphysema, suspect component of pneumonia, continue antibiotics  2. Dehydration -continue IV fluids, daughter reports chronic diarrhea, decreasing oral intake and requesting GI consultation for PEG tube, no thrush on exam, had herpes esophagitis few months ago, may need EGD  3. Dysphagia, chronic after radiation to chest - takes dysphagia diet/ blended, all liquids w ThickIt  4. Metastatic thyroid cancer - health declining last 4-5 mos, pt wants DNR status but not ready for hospice. Cancer worsening by CT chest today. Could have lymphangitic spread in the lungs,  5. Acute COPD exacerbation -continue nebulizer treatments, will add Robitussin-DM   6. Anxiety/ depression  7. Pyuria, f/u urine  cx   DVT prophylaxsis Lovenox  Code Status:  DO NOT RESUSCITATE   Family Communication: Discussed in detail with the patient, all imaging results, lab results explained to the patient   Disposition Plan: Anticipate discharge in the next 2-3 days      Consultants:  Hematology oncology  Gastroenterology  Procedures:  None  Antibiotics: Anti-infectives    Start     Dose/Rate Route Frequency Ordered Stop   10/10/15 0800  vancomycin (VANCOCIN) IVPB 750 mg/150 ml premix     750 mg 150 mL/hr over 60 Minutes Intravenous Every 12 hours 10/09/15 2023     10/09/15 1315  levofloxacin (LEVAQUIN) IVPB 750 mg     750 mg 100 mL/hr over 90 Minutes Intravenous  Once 10/09/15 1300 10/09/15 1506   10/09/15 1315  vancomycin (VANCOCIN) IVPB 1000 mg/200 mL premix     1,000 mg 200 mL/hr over 60 Minutes Intravenous  Once 10/09/15 1300 10/09/15 1628         HPI/Subjective: Daughter is by the bedside and report poor oral intake  Objective: Filed Vitals:   10/09/15 1730 10/09/15 1750 10/09/15 2012 10/10/15 0652  BP: 105/76  96/54 101/67  Pulse: 100 107 109 99  Temp:   97.7 F (36.5 C) 98 F (36.7 C)  TempSrc:   Oral Oral  Resp: '19 19 18 18  '$ Height:      Weight:      SpO2: 96% 94% 95% 97%    Intake/Output Summary (Last 24 hours) at 10/10/15 1010 Last data filed at 10/10/15 0900  Gross per 24 hour  Intake   1060 ml  Output      0 ml  Net  1060 ml    Exam:  Examination:  General exam: Appears calm and comfortable  Respiratory system: Clear to auscultation. Respiratory effort normal. Cardiovascular system: S1 & S2 heard, RRR. No JVD, murmurs, rubs, gallops or clicks. No pedal edema. Gastrointestinal system: Abdomen is nondistended, soft and nontender. No organomegaly or masses felt. Normal bowel sounds heard. Central nervous system: Alert and oriented. No focal neurological deficits. Extremities: Symmetric 5 x 5 power. Skin: No rashes, lesions or ulcers Psychiatry:  Judgement and insight appear normal. Mood & affect appropriate.     Data Reviewed: I have personally reviewed following labs and imaging studies  Micro Results Recent Results (from the past 240 hour(s))  Culture, blood (Routine X 2) w Reflex to ID Panel     Status: None (Preliminary result)   Collection Time: 10/09/15 12:50 PM  Result Value Ref Range Status   Specimen Description BLOOD RIGHT HAND  Final   Special Requests BOTTLES DRAWN AEROBIC AND ANAEROBIC 6CC  Final   Culture NO GROWTH < 24 HOURS  Final   Report Status PENDING  Incomplete  Culture, blood (Routine X 2) w Reflex to ID Panel     Status: None (Preliminary result)   Collection Time: 10/09/15  1:02 PM  Result Value Ref Range Status   Specimen Description BLOOD LEFT HAND  Final   Special Requests BOTTLES DRAWN AEROBIC AND ANAEROBIC 6CC  Final   Culture NO GROWTH < 24 HOURS  Final   Report Status PENDING  Incomplete    Radiology Reports Dg Chest 2 View  09/29/2015  CLINICAL DATA:  Cough. EXAM: CHEST  2 VIEW COMPARISON:  Radiograph of July 08, 2015. FINDINGS: The heart size and mediastinal contours are within normal limits. No pneumothorax or pleural effusion is noted. Atherosclerosis of thoracic aorta is noted. Stable bilateral perihilar and basilar interstitial densities are noted consistent with scarring, although superimposed inflammation cannot be excluded. The visualized skeletal structures are unremarkable. IMPRESSION: Aortic atherosclerosis. Stable bilateral lung scarring is noted, although superimposed inflammation cannot be excluded. Electronically Signed   By: Marijo Conception, M.D.   On: 09/29/2015 15:48   Ct Angio Chest Pe W/cm &/or Wo Cm  10/09/2015  CLINICAL DATA:  Shortness of breath.  Thyroid cancer. EXAM: CT ANGIOGRAPHY CHEST WITH CONTRAST TECHNIQUE: Multidetector CT imaging of the chest was performed using the standard protocol during bolus administration of intravenous contrast. Multiplanar CT image  reconstructions and MIPs were obtained to evaluate the vascular anatomy. CONTRAST:  100 cc Isovue 370 IV. COMPARISON:  Chest radiograph from earlier today. 04/28/2015 chest CT angiogram. FINDINGS: Mediastinum/Nodes: The study is moderate quality for the evaluation of pulmonary embolism, with evaluation of the subsegmental pulmonary arteries limited by motion artifact. There are no filling defects in the central, lobar, segmental or subsegmental pulmonary artery branches to suggest acute pulmonary embolism. Atherosclerotic nonaneurysmal thoracic aorta. Main pulmonary diameter 3.0 cm is within normal limits. Normal heart size. Small pericardial effusion/ thickening measuring up to 7 mm thickness, not appreciably changed. Surgically absent thyroid. Unremarkable esophagus. No axillary adenopathy. New enlarged 2.1 cm right subcarinal node (series 4/ image 66). New mild confluent bilateral hilar adenopathy measuring up to 1.0 cm on the right (series 4/ image 57) and 1.2 cm on the left (series 4/ image 54). Lungs/Pleura: No pneumothorax. No pleural effusion. There is extrinsic narrowing of the bilateral mainstem and lobar bronchi by the confluent subcarinal and bilateral hilar adenopathy. There is increased diffuse peribronchovascular interstitial thickening throughout both lungs. Moderate centrilobular emphysema.  There are new mild patchy perilobular ground-glass opacities and small patchy nodular areas of consolidation in the bilateral peripheral lungs, most prominent in the dependent lower lobes, for example a 1.1 cm nodular focus of consolidation in the posterior right lower lobe (series 6/ image 84). Tiny right upper lobe 3 mm pulmonary nodules are stable. Upper abdomen: New fat stranding in the upper omental fat. Bilateral retrocrural lymphadenopathy is increased, with enlarged calcified 1.4 cm right retrocrural node (series 4/ image 111), previously 0.9 cm and enlarged calcified 1.1 cm left retrocrural node,  previously 1.0 cm. Calcified gastrohepatic ligament lymphadenopathy is increased, for example a 1.0 cm calcified gastrohepatic ligament node (series 4/ image 115), increased from 0.7 cm. Cholecystectomy. Musculoskeletal: No aggressive appearing focal osseous lesions. Nondisplaced healing anterior right second through sixth rib fractures. Mild thoracic spondylosis. Review of the MIP images confirms the above findings. IMPRESSION: 1. No pulmonary embolism. 2. New confluent subcarinal and bilateral hilar adenopathy, causing extrinsic narrowing of the bilateral central bronchi, worrisome for metastatic adenopathy. 3. Increased diffuse peribronchovascular interstitial thickening in both lungs, worrisome for lymphangitic tumor . 4. New mild patchy perilobular ground-glass opacities and small patchy nodular areas of consolidation in the bilateral peripheral lungs, probably inflammatory. 5. Stable small pericardial effusion/thickening . 6. Increased bilateral retrocrural and gastrohepatic ligament calcified adenopathy, most consistent with metastatic thyroid carcinoma adenopathy. 7. Healing subacute anterior right second through sixth rib fractures. 8. Moderate centrilobular emphysema. 9. Aortic atherosclerosis. Electronically Signed   By: Ilona Sorrel M.D.   On: 10/09/2015 17:39   Dg Chest Port 1 View  10/09/2015  CLINICAL DATA:  Cough. History of thyroid carcinoma. Recent pneumonia. EXAM: PORTABLE CHEST 1 VIEW COMPARISON:  September 29, 2015 FINDINGS: There is stable fibrotic type change in the mid and lower lung zones, somewhat more pronounced on the right than on the left. There is no new opacity. No frank edema or consolidation. Heart size and pulmonary vascularity are normal. No adenopathy. There is atherosclerotic calcification in the aorta. Trachea is midline. No bone lesions evident. IMPRESSION: Stable fibrotic type change in the mid and lower lung zones, more pronounced on the right than on the left, stable. No new  opacity. Stable cardiac silhouette. Aortic atherosclerosis noted. Electronically Signed   By: Lowella Grip III M.D.   On: 10/09/2015 12:58     CBC  Recent Labs Lab 10/09/15 1255 10/10/15 0433  WBC 6.2 6.4  HGB 16.3* 13.3  HCT 49.6* 42.0  PLT 313 264  MCV 79.7 80.2  MCH 26.2 25.4*  MCHC 32.9 31.7  RDW 17.6* 18.0*  LYMPHSABS 0.7  --   MONOABS 0.8  --   EOSABS 0.0  --   BASOSABS 0.0  --     Chemistries   Recent Labs Lab 10/09/15 1255 10/10/15 0433  NA 133* 137  K 3.6 3.8  CL 102 109  CO2 20* 20*  GLUCOSE 115* 96  BUN 10 11  CREATININE 0.79 0.77  CALCIUM 9.3 8.3*  AST 24  --   ALT 15  --   ALKPHOS 99  --   BILITOT 0.6  --    ------------------------------------------------------------------------------------------------------------------ estimated creatinine clearance is 65 mL/min (by C-G formula based on Cr of 0.77). ------------------------------------------------------------------------------------------------------------------ No results for input(s): HGBA1C in the last 72 hours. ------------------------------------------------------------------------------------------------------------------ No results for input(s): CHOL, HDL, LDLCALC, TRIG, CHOLHDL, LDLDIRECT in the last 72 hours. ------------------------------------------------------------------------------------------------------------------ No results for input(s): TSH, T4TOTAL, T3FREE, THYROIDAB in the last 72 hours.  Invalid input(s): FREET3 ------------------------------------------------------------------------------------------------------------------ No results  for input(s): VITAMINB12, FOLATE, FERRITIN, TIBC, IRON, RETICCTPCT in the last 72 hours.  Coagulation profile No results for input(s): INR, PROTIME in the last 168 hours.  No results for input(s): DDIMER in the last 72 hours.  Cardiac Enzymes No results for input(s): CKMB, TROPONINI, MYOGLOBIN in the last 168 hours.  Invalid  input(s): CK ------------------------------------------------------------------------------------------------------------------ Invalid input(s): POCBNP   CBG: No results for input(s): GLUCAP in the last 168 hours.     Studies: Ct Angio Chest Pe W/cm &/or Wo Cm  10/09/2015  CLINICAL DATA:  Shortness of breath.  Thyroid cancer. EXAM: CT ANGIOGRAPHY CHEST WITH CONTRAST TECHNIQUE: Multidetector CT imaging of the chest was performed using the standard protocol during bolus administration of intravenous contrast. Multiplanar CT image reconstructions and MIPs were obtained to evaluate the vascular anatomy. CONTRAST:  100 cc Isovue 370 IV. COMPARISON:  Chest radiograph from earlier today. 04/28/2015 chest CT angiogram. FINDINGS: Mediastinum/Nodes: The study is moderate quality for the evaluation of pulmonary embolism, with evaluation of the subsegmental pulmonary arteries limited by motion artifact. There are no filling defects in the central, lobar, segmental or subsegmental pulmonary artery branches to suggest acute pulmonary embolism. Atherosclerotic nonaneurysmal thoracic aorta. Main pulmonary diameter 3.0 cm is within normal limits. Normal heart size. Small pericardial effusion/ thickening measuring up to 7 mm thickness, not appreciably changed. Surgically absent thyroid. Unremarkable esophagus. No axillary adenopathy. New enlarged 2.1 cm right subcarinal node (series 4/ image 66). New mild confluent bilateral hilar adenopathy measuring up to 1.0 cm on the right (series 4/ image 57) and 1.2 cm on the left (series 4/ image 54). Lungs/Pleura: No pneumothorax. No pleural effusion. There is extrinsic narrowing of the bilateral mainstem and lobar bronchi by the confluent subcarinal and bilateral hilar adenopathy. There is increased diffuse peribronchovascular interstitial thickening throughout both lungs. Moderate centrilobular emphysema. There are new mild patchy perilobular ground-glass opacities and small  patchy nodular areas of consolidation in the bilateral peripheral lungs, most prominent in the dependent lower lobes, for example a 1.1 cm nodular focus of consolidation in the posterior right lower lobe (series 6/ image 84). Tiny right upper lobe 3 mm pulmonary nodules are stable. Upper abdomen: New fat stranding in the upper omental fat. Bilateral retrocrural lymphadenopathy is increased, with enlarged calcified 1.4 cm right retrocrural node (series 4/ image 111), previously 0.9 cm and enlarged calcified 1.1 cm left retrocrural node, previously 1.0 cm. Calcified gastrohepatic ligament lymphadenopathy is increased, for example a 1.0 cm calcified gastrohepatic ligament node (series 4/ image 115), increased from 0.7 cm. Cholecystectomy. Musculoskeletal: No aggressive appearing focal osseous lesions. Nondisplaced healing anterior right second through sixth rib fractures. Mild thoracic spondylosis. Review of the MIP images confirms the above findings. IMPRESSION: 1. No pulmonary embolism. 2. New confluent subcarinal and bilateral hilar adenopathy, causing extrinsic narrowing of the bilateral central bronchi, worrisome for metastatic adenopathy. 3. Increased diffuse peribronchovascular interstitial thickening in both lungs, worrisome for lymphangitic tumor . 4. New mild patchy perilobular ground-glass opacities and small patchy nodular areas of consolidation in the bilateral peripheral lungs, probably inflammatory. 5. Stable small pericardial effusion/thickening . 6. Increased bilateral retrocrural and gastrohepatic ligament calcified adenopathy, most consistent with metastatic thyroid carcinoma adenopathy. 7. Healing subacute anterior right second through sixth rib fractures. 8. Moderate centrilobular emphysema. 9. Aortic atherosclerosis. Electronically Signed   By: Ilona Sorrel M.D.   On: 10/09/2015 17:39   Dg Chest Port 1 View  10/09/2015  CLINICAL DATA:  Cough. History of thyroid carcinoma. Recent pneumonia. EXAM:  PORTABLE  CHEST 1 VIEW COMPARISON:  September 29, 2015 FINDINGS: There is stable fibrotic type change in the mid and lower lung zones, somewhat more pronounced on the right than on the left. There is no new opacity. No frank edema or consolidation. Heart size and pulmonary vascularity are normal. No adenopathy. There is atherosclerotic calcification in the aorta. Trachea is midline. No bone lesions evident. IMPRESSION: Stable fibrotic type change in the mid and lower lung zones, more pronounced on the right than on the left, stable. No new opacity. Stable cardiac silhouette. Aortic atherosclerosis noted. Electronically Signed   By: Lowella Grip III M.D.   On: 10/09/2015 12:58      Lab Results  Component Value Date   HGBA1C 6.9* 03/17/2015   HGBA1C 7.2* 05/07/2013   Lab Results  Component Value Date   LDLCALC 65 07/28/2014   CREATININE 0.77 10/10/2015       Scheduled Meds: . enoxaparin (LOVENOX) injection  40 mg Subcutaneous Q24H  . levothyroxine  112 mcg Oral QAC breakfast  . megestrol  400 mg Oral Daily  . pantoprazole  40 mg Oral BID  . potassium chloride SA  40 mEq Oral BID  . RESOURCE THICKENUP CLEAR   Oral Daily  . sertraline  50 mg Oral Daily  . vancomycin  750 mg Intravenous Q12H   Continuous Infusions: . sodium chloride 125 mL/hr at 10/10/15 0523     LOS: 1 day    Time spent: >30 MINS    [facility]  [facility] Pager [phone]. If 7PM-7AM, please contact night-coverage at [email or web address], password [facility] 10/10/2015, 10:10 AM  LOS: 1 day

## 2015-10-11 DIAGNOSIS — J440 Chronic obstructive pulmonary disease with acute lower respiratory infection: Principal | ICD-10-CM

## 2015-10-11 DIAGNOSIS — R1310 Dysphagia, unspecified: Secondary | ICD-10-CM

## 2015-10-11 LAB — CBC
HEMATOCRIT: 38.5 % (ref 36.0–46.0)
Hemoglobin: 12.1 g/dL (ref 12.0–15.0)
MCH: 25.3 pg — ABNORMAL LOW (ref 26.0–34.0)
MCHC: 31.4 g/dL (ref 30.0–36.0)
MCV: 80.4 fL (ref 78.0–100.0)
PLATELETS: 253 10*3/uL (ref 150–400)
RBC: 4.79 MIL/uL (ref 3.87–5.11)
RDW: 18.2 % — AB (ref 11.5–15.5)
WBC: 5.8 10*3/uL (ref 4.0–10.5)

## 2015-10-11 LAB — COMPREHENSIVE METABOLIC PANEL
ALBUMIN: 2.5 g/dL — AB (ref 3.5–5.0)
ALT: 10 U/L — ABNORMAL LOW (ref 14–54)
ANION GAP: 6 (ref 5–15)
AST: 14 U/L — ABNORMAL LOW (ref 15–41)
Alkaline Phosphatase: 59 U/L (ref 38–126)
BILIRUBIN TOTAL: 0.6 mg/dL (ref 0.3–1.2)
BUN: 6 mg/dL (ref 6–20)
CHLORIDE: 114 mmol/L — AB (ref 101–111)
CO2: 17 mmol/L — ABNORMAL LOW (ref 22–32)
Calcium: 8.3 mg/dL — ABNORMAL LOW (ref 8.9–10.3)
Creatinine, Ser: 0.51 mg/dL (ref 0.44–1.00)
GFR calc Af Amer: >60 mL/min (ref >60)
GLUCOSE: 86 mg/dL (ref 65–99)
POTASSIUM: 3.5 mmol/L (ref 3.5–5.1)
Sodium: 137 mmol/L (ref 135–145)
TOTAL PROTEIN: 5.2 g/dL — AB (ref 6.5–8.1)

## 2015-10-11 MED ORDER — LEVALBUTEROL HCL 1.25 MG/0.5ML IN NEBU: 1.2500 mg | INHALATION_SOLUTION | RESPIRATORY_TRACT | Status: DC | PRN

## 2015-10-11 MED ORDER — LEVALBUTEROL HCL 1.25 MG/0.5ML IN NEBU: 1.2500 mg | INHALATION_SOLUTION | Freq: Four times a day (QID) | RESPIRATORY_TRACT | Status: DC

## 2015-10-11 MED ORDER — METHYLPREDNISOLONE SODIUM SUCC 125 MG IJ SOLR
60.0000 mg | Freq: Two times a day (BID) | INTRAMUSCULAR | Status: DC
  Administered 2015-10-11 – 2015-10-13 (×5): 60 mg via INTRAVENOUS
  Filled 2015-10-11 (×5): qty 2

## 2015-10-11 MED ORDER — LEVALBUTEROL HCL 1.25 MG/0.5ML IN NEBU
1.2500 mg | INHALATION_SOLUTION | Freq: Four times a day (QID) | RESPIRATORY_TRACT | Status: DC
  Administered 2015-10-11 – 2015-10-13 (×5): 1.25 mg via RESPIRATORY_TRACT
  Filled 2015-10-11 (×4): qty 0.5

## 2015-10-11 NOTE — Progress Notes (Signed)
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 female with history of metastatic thyroid cancer presenting with increased productive cough, gen weakness, poor po intake for several days. Unable to get OOB now. She was dx'd with PNA no 6/26 and had been getting every other day Ceftriaxone at [facility] through 6/30 w/o relief. Today was seen by oncology and they sent her to ED for hospital admission   Assessment and plan  1. Acute COPD exacerbation/lymphangitis spread of tumor, CT scan showed no PE, hilar lymphadenopathy causing extrinsic narrowing of bilateral central bronchi worrisome for metastatic adenopathy, CT scan worrisome for lymphangitic tumor spread, new mild patchy groundglass opacities, centrilobular emphysema, suspect component of pneumonia, continue antibiotics, also start patient on steroids and scheduled breathing treatments  2. Dehydration -continue IV fluids, daughter reports chronic diarrhea, decreasing oral intake and patient today is refusing EGD and PEG tube, GI consultation has been canceled  3. Dysphagia, chronic after radiation to chest - takes dysphagia diet/ blended, all liquids w ThickIt. Refusing PEG tube and also refusing palliative care consultation  4. Metastatic thyroid cancer - health declining last 4-5 mos, pt wants DNR status but not ready for hospice. Cancer worsening by CT chest today. Could have lymphangitic spread in the lungs,  encourage patient and her daughter to discuss prognosis with her regular oncologist  5. Acute COPD exacerbation -continue nebulizer treatments, will add Robitussin-DM   6. Anxiety/ depression  7. Pyuria, f/u urine cx   DVT prophylaxsis Lovenox  Code Status:  DO NOT RESUSCITATE   Family Communication: Discussed in detail with the patient and her daughters, all imaging results, lab results explained to the patient   Disposition Plan: Anticipate discharge in the next 2-3 days      Consultants:      Procedures:  None  Antibiotics: Anti-infectives    Start     Dose/Rate Route Frequency Ordered Stop   10/10/15 1030  imipenem-cilastatin (PRIMAXIN) 250 mg in sodium chloride 0.9 % 100 mL IVPB     250 mg 200 mL/hr over 30 Minutes Intravenous Every 6 hours 10/10/15 1026     10/10/15 0800  vancomycin (VANCOCIN) IVPB 750 mg/150 ml premix     750 mg 150 mL/hr over 60 Minutes Intravenous Every 12 hours 10/09/15 2023     10/09/15 1315  levofloxacin (LEVAQUIN) IVPB 750 mg     750 mg 100 mL/hr over 90 Minutes Intravenous  Once 10/09/15 1300 10/09/15 1506   10/09/15 1315  vancomycin (VANCOCIN) IVPB 1000 mg/200 mL premix     1,000 mg 200 mL/hr over 60 Minutes Intravenous  Once 10/09/15 1300 10/09/15 1628         HPI/Subjective: Continues to have some coughing and wheezing,  Objective: Filed Vitals:   10/10/15 1408 10/10/15 1805 10/10/15 2230 10/11/15 0621  BP: 100/67  113/74 124/79  Pulse: 94  99 97  Temp: 98.7 F (37.1 C)  97.8 F (36.6 C) 98.5 F (36.9 C)  TempSrc:  Oral  Oral Oral  Resp: '20  20 20  '$ Height:      Weight:    54.795 kg (120 lb 12.8 oz)  SpO2: 96% 94% 96% 95%    Intake/Output Summary (Last 24 hours) at 10/11/15 1028 Last data filed at 10/10/15 1900  Gross per 24 hour  Intake    180 ml  Output      0 ml  Net    180 ml    Exam:  Examination:  General exam: Appears calm and comfortable  Respiratory system: Wheezing. Respiratory effort  normal. Cardiovascular system: S1 & S2 heard, RRR. No JVD, murmurs, rubs, gallops or clicks. No pedal edema. Gastrointestinal system: Abdomen is nondistended, soft and nontender. No organomegaly or masses felt. Normal bowel sounds heard. Central nervous system: Alert and oriented. No focal neurological deficits. Extremities: Symmetric 5 x 5 power. Skin: No rashes, lesions or ulcers Psychiatry: Judgement and insight appear normal. Mood & affect appropriate.     Data Reviewed: I have personally reviewed following labs and imaging studies  Micro Results Recent Results (from the past 240 hour(s))  Culture, blood (Routine X 2) w Reflex to ID Panel     Status: None (Preliminary result)   Collection Time: 10/09/15 12:50 PM  Result Value Ref Range Status   Specimen Description BLOOD RIGHT HAND  Final   Special Requests BOTTLES DRAWN AEROBIC AND ANAEROBIC 6CC  Final   Culture NO GROWTH 2 DAYS  Final   Report Status PENDING  Incomplete  Culture, blood (Routine X 2) w Reflex to ID Panel     Status: None (Preliminary result)   Collection Time: 10/09/15  1:02 PM  Result Value Ref Range Status   Specimen Description BLOOD LEFT HAND  Final   Special Requests BOTTLES DRAWN AEROBIC AND ANAEROBIC 6CC  Final   Culture NO GROWTH 2 DAYS  Final   Report Status PENDING  Incomplete  Urine culture     Status: Abnormal   Collection Time: 10/09/15  5:10 PM  Result Value Ref Range Status   Specimen Description URINE, CLEAN CATCH  Final   Special Requests NONE  Final   Culture (A)  Final    <10,000 COLONIES/mL INSIGNIFICANT GROWTH Performed at [facility]    Report Status 10/10/2015 FINAL  Final    Radiology Reports Dg Chest 2 View  09/29/2015  CLINICAL DATA:  Cough. EXAM: CHEST  2 VIEW COMPARISON:  Radiograph of July 08, 2015. FINDINGS: The heart size and mediastinal contours are within normal limits. No pneumothorax or pleural effusion is noted. Atherosclerosis of thoracic aorta is noted.  Stable bilateral perihilar and basilar interstitial densities are noted consistent with scarring, although superimposed inflammation cannot be excluded. The visualized skeletal structures are unremarkable. IMPRESSION: Aortic atherosclerosis. Stable bilateral lung scarring is noted, although superimposed inflammation cannot be excluded. Electronically Signed   By: Marijo Conception, M.D.   On: 09/29/2015 15:48   Ct Angio Chest Pe W/cm &/or Wo Cm  10/09/2015  CLINICAL DATA:  Shortness of breath.  Thyroid cancer. EXAM: CT ANGIOGRAPHY CHEST WITH CONTRAST TECHNIQUE: Multidetector CT imaging of the chest was performed using the standard protocol during bolus administration of intravenous contrast. Multiplanar CT image reconstructions and MIPs were obtained to evaluate the vascular anatomy. CONTRAST:  100 cc Isovue 370 IV. COMPARISON:  Chest radiograph from earlier today. 04/28/2015 chest CT angiogram. FINDINGS: Mediastinum/Nodes: The study is moderate quality for the evaluation of pulmonary embolism, with evaluation of the subsegmental pulmonary arteries  limited by motion artifact. There are no filling defects in the central, lobar, segmental or subsegmental pulmonary artery branches to suggest acute pulmonary embolism. Atherosclerotic nonaneurysmal thoracic aorta. Main pulmonary diameter 3.0 cm is within normal limits. Normal heart size. Small pericardial effusion/ thickening measuring up to 7 mm thickness, not appreciably changed. Surgically absent thyroid. Unremarkable esophagus. No axillary adenopathy. New enlarged 2.1 cm right subcarinal node (series 4/ image 66). New mild confluent bilateral hilar adenopathy measuring up to 1.0 cm on the right (series 4/ image 57) and 1.2 cm on the left (series 4/ image 54). Lungs/Pleura: No pneumothorax. No pleural effusion. There is extrinsic narrowing of the bilateral mainstem and lobar bronchi by the confluent subcarinal and bilateral hilar adenopathy. There is increased diffuse  peribronchovascular interstitial thickening throughout both lungs. Moderate centrilobular emphysema. There are new mild patchy perilobular ground-glass opacities and small patchy nodular areas of consolidation in the bilateral peripheral lungs, most prominent in the dependent lower lobes, for example a 1.1 cm nodular focus of consolidation in the posterior right lower lobe (series 6/ image 84). Tiny right upper lobe 3 mm pulmonary nodules are stable. Upper abdomen: New fat stranding in the upper omental fat. Bilateral retrocrural lymphadenopathy is increased, with enlarged calcified 1.4 cm right retrocrural node (series 4/ image 111), previously 0.9 cm and enlarged calcified 1.1 cm left retrocrural node, previously 1.0 cm. Calcified gastrohepatic ligament lymphadenopathy is increased, for example a 1.0 cm calcified gastrohepatic ligament node (series 4/ image 115), increased from 0.7 cm. Cholecystectomy. Musculoskeletal: No aggressive appearing focal osseous lesions. Nondisplaced healing anterior right second through sixth rib fractures. Mild thoracic spondylosis. Review of the MIP images confirms the above findings. IMPRESSION: 1. No pulmonary embolism. 2. New confluent subcarinal and bilateral hilar adenopathy, causing extrinsic narrowing of the bilateral central bronchi, worrisome for metastatic adenopathy. 3. Increased diffuse peribronchovascular interstitial thickening in both lungs, worrisome for lymphangitic tumor . 4. New mild patchy perilobular ground-glass opacities and small patchy nodular areas of consolidation in the bilateral peripheral lungs, probably inflammatory. 5. Stable small pericardial effusion/thickening . 6. Increased bilateral retrocrural and gastrohepatic ligament calcified adenopathy, most consistent with metastatic thyroid carcinoma adenopathy. 7. Healing subacute anterior right second through sixth rib fractures. 8. Moderate centrilobular emphysema. 9. Aortic atherosclerosis.  Electronically Signed   By: Ilona Sorrel M.D.   On: 10/09/2015 17:39   Dg Chest Port 1 View  10/09/2015  CLINICAL DATA:  Cough. History of thyroid carcinoma. Recent pneumonia. EXAM: PORTABLE CHEST 1 VIEW COMPARISON:  September 29, 2015 FINDINGS: There is stable fibrotic type change in the mid and lower lung zones, somewhat more pronounced on the right than on the left. There is no new opacity. No frank edema or consolidation. Heart size and pulmonary vascularity are normal. No adenopathy. There is atherosclerotic calcification in the aorta. Trachea is midline. No bone lesions evident. IMPRESSION: Stable fibrotic type change in the mid and lower lung zones, more pronounced on the right than on the left, stable. No new opacity. Stable cardiac silhouette. Aortic atherosclerosis noted. Electronically Signed   By: Lowella Grip III M.D.   On: 10/09/2015 12:58     CBC  Recent Labs Lab 10/09/15 1255 10/10/15 0433 10/11/15 0606  WBC 6.2 6.4 5.8  HGB 16.3* 13.3 12.1  HCT 49.6* 42.0 38.5  PLT 313 264 253  MCV 79.7 80.2 80.4  MCH 26.2 25.4* 25.3*  MCHC 32.9 31.7 31.4  RDW 17.6* 18.0* 18.2*  LYMPHSABS 0.7  --   --  MONOABS 0.8  --   --   EOSABS 0.0  --   --   BASOSABS 0.0  --   --     Chemistries   Recent Labs Lab 10/09/15 1255 10/10/15 0433 10/11/15 0606  NA 133* 137 137  K 3.6 3.8 3.5  CL 102 109 114*  CO2 20* 20* 17*  GLUCOSE 115* 96 86  BUN '10 11 6  '$ CREATININE 0.79 0.77 0.51  CALCIUM 9.3 8.3* 8.3*  AST 24  --  14*  ALT 15  --  10*  ALKPHOS 99  --  59  BILITOT 0.6  --  0.6   ------------------------------------------------------------------------------------------------------------------ estimated creatinine clearance is 65.4 mL/min (by C-G formula based on Cr of 0.51). ------------------------------------------------------------------------------------------------------------------ No results for input(s): HGBA1C in the last 72  hours. ------------------------------------------------------------------------------------------------------------------ No results for input(s): CHOL, HDL, LDLCALC, TRIG, CHOLHDL, LDLDIRECT in the last 72 hours. ------------------------------------------------------------------------------------------------------------------ No results for input(s): TSH, T4TOTAL, T3FREE, THYROIDAB in the last 72 hours.  Invalid input(s): FREET3 ------------------------------------------------------------------------------------------------------------------ No results for input(s): VITAMINB12, FOLATE, FERRITIN, TIBC, IRON, RETICCTPCT in the last 72 hours.  Coagulation profile No results for input(s): INR, PROTIME in the last 168 hours.  No results for input(s): DDIMER in the last 72 hours.  Cardiac Enzymes No results for input(s): CKMB, TROPONINI, MYOGLOBIN in the last 168 hours.  Invalid input(s): CK ------------------------------------------------------------------------------------------------------------------ Invalid input(s): POCBNP   CBG: No results for input(s): GLUCAP in the last 168 hours.     Studies: Ct Angio Chest Pe W/cm &/or Wo Cm  10/09/2015  CLINICAL DATA:  Shortness of breath.  Thyroid cancer. EXAM: CT ANGIOGRAPHY CHEST WITH CONTRAST TECHNIQUE: Multidetector CT imaging of the chest was performed using the standard protocol during bolus administration of intravenous contrast. Multiplanar CT image reconstructions and MIPs were obtained to evaluate the vascular anatomy. CONTRAST:  100 cc Isovue 370 IV. COMPARISON:  Chest radiograph from earlier today. 04/28/2015 chest CT angiogram. FINDINGS: Mediastinum/Nodes: The study is moderate quality for the evaluation of pulmonary embolism, with evaluation of the subsegmental pulmonary arteries limited by motion artifact. There are no filling defects in the central, lobar, segmental or subsegmental pulmonary artery branches to suggest acute  pulmonary embolism. Atherosclerotic nonaneurysmal thoracic aorta. Main pulmonary diameter 3.0 cm is within normal limits. Normal heart size. Small pericardial effusion/ thickening measuring up to 7 mm thickness, not appreciably changed. Surgically absent thyroid. Unremarkable esophagus. No axillary adenopathy. New enlarged 2.1 cm right subcarinal node (series 4/ image 66). New mild confluent bilateral hilar adenopathy measuring up to 1.0 cm on the right (series 4/ image 57) and 1.2 cm on the left (series 4/ image 54). Lungs/Pleura: No pneumothorax. No pleural effusion. There is extrinsic narrowing of the bilateral mainstem and lobar bronchi by the confluent subcarinal and bilateral hilar adenopathy. There is increased diffuse peribronchovascular interstitial thickening throughout both lungs. Moderate centrilobular emphysema. There are new mild patchy perilobular ground-glass opacities and small patchy nodular areas of consolidation in the bilateral peripheral lungs, most prominent in the dependent lower lobes, for example a 1.1 cm nodular focus of consolidation in the posterior right lower lobe (series 6/ image 84). Tiny right upper lobe 3 mm pulmonary nodules are stable. Upper abdomen: New fat stranding in the upper omental fat. Bilateral retrocrural lymphadenopathy is increased, with enlarged calcified 1.4 cm right retrocrural node (series 4/ image 111), previously 0.9 cm and enlarged calcified 1.1 cm left retrocrural node, previously 1.0 cm. Calcified gastrohepatic ligament lymphadenopathy is increased, for example a 1.0 cm calcified gastrohepatic ligament  node (series 4/ image 115), increased from 0.7 cm. Cholecystectomy. Musculoskeletal: No aggressive appearing focal osseous lesions. Nondisplaced healing anterior right second through sixth rib fractures. Mild thoracic spondylosis. Review of the MIP images confirms the above findings. IMPRESSION: 1. No pulmonary embolism. 2. New confluent subcarinal and  bilateral hilar adenopathy, causing extrinsic narrowing of the bilateral central bronchi, worrisome for metastatic adenopathy. 3. Increased diffuse peribronchovascular interstitial thickening in both lungs, worrisome for lymphangitic tumor . 4. New mild patchy perilobular ground-glass opacities and small patchy nodular areas of consolidation in the bilateral peripheral lungs, probably inflammatory. 5. Stable small pericardial effusion/thickening . 6. Increased bilateral retrocrural and gastrohepatic ligament calcified adenopathy, most consistent with metastatic thyroid carcinoma adenopathy. 7. Healing subacute anterior right second through sixth rib fractures. 8. Moderate centrilobular emphysema. 9. Aortic atherosclerosis. Electronically Signed   By: Ilona Sorrel M.D.   On: 10/09/2015 17:39   Dg Chest Port 1 View  10/09/2015  CLINICAL DATA:  Cough. History of thyroid carcinoma. Recent pneumonia. EXAM: PORTABLE CHEST 1 VIEW COMPARISON:  September 29, 2015 FINDINGS: There is stable fibrotic type change in the mid and lower lung zones, somewhat more pronounced on the right than on the left. There is no new opacity. No frank edema or consolidation. Heart size and pulmonary vascularity are normal. No adenopathy. There is atherosclerotic calcification in the aorta. Trachea is midline. No bone lesions evident. IMPRESSION: Stable fibrotic type change in the mid and lower lung zones, more pronounced on the right than on the left, stable. No new opacity. Stable cardiac silhouette. Aortic atherosclerosis noted. Electronically Signed   By: Lowella Grip III M.D.   On: 10/09/2015 12:58      Lab Results  Component Value Date   HGBA1C 6.9* 03/17/2015   HGBA1C 7.2* 05/07/2013   Lab Results  Component Value Date   LDLCALC 65 07/28/2014   CREATININE 0.51 10/11/2015       Scheduled Meds: . enoxaparin (LOVENOX) injection  40 mg Subcutaneous Q24H  . guaiFENesin-dextromethorphan  5 mL Oral Q4H  . imipenem-cilastatin   250 mg Intravenous Q6H  . levothyroxine  112 mcg Oral QAC breakfast  . megestrol  400 mg Oral Daily  . pantoprazole  40 mg Oral BID  . potassium chloride SA  40 mEq Oral BID  . RESOURCE THICKENUP CLEAR   Oral Daily  . sertraline  100 mg Oral Daily  . vancomycin  750 mg Intravenous Q12H   Continuous Infusions: . sodium chloride 125 mL/hr at 10/10/15 0523     LOS: 2 days    Time spent: >30 MINS    [facility]  [facility] Pager [phone]. If 7PM-7AM, please contact night-coverage at [email or web address], password [facility] 10/11/2015, 10:28 AM  LOS: 2 days

## 2015-10-11 NOTE — Progress Notes (Signed)
CM contacted [facility] faxed information for review and processing pending d/c from hospital.

## 2015-10-12 DIAGNOSIS — Z7189 Other specified counseling: Secondary | ICD-10-CM

## 2015-10-12 LAB — COMPREHENSIVE METABOLIC PANEL
ALT: 10 U/L — AB (ref 14–54)
ANION GAP: 8 (ref 5–15)
AST: 14 U/L — ABNORMAL LOW (ref 15–41)
Albumin: 2.8 g/dL — ABNORMAL LOW (ref 3.5–5.0)
Alkaline Phosphatase: 70 U/L (ref 38–126)
BUN: <5 mg/dL — ABNORMAL LOW (ref 6–20)
CALCIUM: 8.5 mg/dL — AB (ref 8.9–10.3)
CHLORIDE: 111 mmol/L (ref 101–111)
CO2: 17 mmol/L — AB (ref 22–32)
CREATININE: 0.48 mg/dL (ref 0.44–1.00)
GFR calc non Af Amer: >60 mL/min (ref >60)
Glucose, Bld: 128 mg/dL — ABNORMAL HIGH (ref 65–99)
POTASSIUM: 3.2 mmol/L — AB (ref 3.5–5.1)
SODIUM: 136 mmol/L (ref 135–145)
TOTAL PROTEIN: 5.9 g/dL — AB (ref 6.5–8.1)
Total Bilirubin: 0.9 mg/dL (ref 0.3–1.2)

## 2015-10-12 LAB — MAGNESIUM: MAGNESIUM: 1.5 mg/dL — AB (ref 1.7–2.4)

## 2015-10-12 LAB — CBC
HCT: 38.8 % (ref 36.0–46.0)
Hemoglobin: 12.3 g/dL (ref 12.0–15.0)
MCH: 25.1 pg — AB (ref 26.0–34.0)
MCHC: 31.7 g/dL (ref 30.0–36.0)
MCV: 79.2 fL (ref 78.0–100.0)
PLATELETS: 309 10*3/uL (ref 150–400)
RBC: 4.9 MIL/uL (ref 3.87–5.11)
RDW: 18.4 % — AB (ref 11.5–15.5)
WBC: 3.3 10*3/uL — ABNORMAL LOW (ref 4.0–10.5)

## 2015-10-12 MED ORDER — LEVALBUTEROL HCL 0.63 MG/3ML IN NEBU
INHALATION_SOLUTION | RESPIRATORY_TRACT | Status: AC
  Administered 2015-10-12: 0.63 mg
  Filled 2015-10-12: qty 3

## 2015-10-12 MED ORDER — LEVOFLOXACIN IN D5W 750 MG/150ML IV SOLN
750.0000 mg | INTRAVENOUS | Status: DC
  Administered 2015-10-12: 750 mg via INTRAVENOUS

## 2015-10-12 MED ORDER — SODIUM CHLORIDE 0.9 % IV SOLN
INTRAVENOUS | Status: DC
  Administered 2015-10-12: 12:00:00 via INTRAVENOUS
  Filled 2015-10-12 (×3): qty 1000

## 2015-10-12 NOTE — Progress Notes (Signed)
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 female with history of metastatic thyroid cancer presenting with increased productive cough, gen weakness, poor po intake for several days. Unable to get OOB now. She was dx'd with PNA no 6/26 and had been getting every other day Ceftriaxone at [facility] through 6/30 w/o relief. Today was seen by oncology and they sent her to ED for hospital admission. Found to have  worsening metastatic disease of the lung , possibly pneumonia/COPD exacerbation   Assessment and plan  1. Acute COPD exacerbation/lymphangitic spread of tumor, CT scan showed no PE, hilar lymphadenopathy causing extrinsic narrowing of bilateral central bronchi worrisome for metastatic adenopathy, CT scan worrisome for lymphangitic tumor spread, new mild patchy groundglass opacities, centrilobular emphysema, suspect component of pneumonia, change imipenem and vancomycin to Levaquin, continue steroids and scheduled breathing treatments  2. Dehydration -continue IV fluids with potassium, daughter reports chronic diarrhea, decreasing oral intake and patient today is refusing EGD and PEG tube, GI consultation has been canceled  3. Dysphagia, chronic after radiation to chest - takes dysphagia diet/ blended, all liquids w ThickIt. Refusing PEG tube and also refusing palliative care consultation. Will order speech therapy consult for final recommendations  4. Metastatic  thyroid cancer - health declining last 4-5 mos, pt wants DNR status but not ready for hospice. Cancer worsening by CT chest today. Could have lymphangitic spread in the lungs, encourage patient and her daughter to discuss prognosis with her regular oncologist  5. Acute COPD exacerbation -continue nebulizer treatments, will add Robitussin-DM   6. Anxiety/ depression-stable  7. Pyuria, f/u urine cx   DVT prophylaxsis Lovenox  Code Status:  DO NOT RESUSCITATE   Family Communication: Discussed in detail with the patient and her daughters, all imaging results, lab results explained to the patient   Disposition Plan: Anticipate discharge in the next 2-3 days, PT OT evaluation      Consultants:   None   Procedures:  None  Antibiotics: Vancomycin-imipenem-7/6- 7/9 Levaquin 7/9-7/13     HPI/Subjective: Continues to have some coughing and wheezing, overall improved, daughter is by the bedside who agrees  Objective: Filed Vitals:   10/11/15 2241 10/12/15 0142 10/12/15 0625 10/12/15 0751  BP: 109/75  148/90   Pulse: 104  99   Temp: 98.2 F (36.8 C)  98.3 F (36.8 C)   TempSrc: Oral  Oral   Resp: 18  18   Height:      Weight:   52.753 kg (116 lb 4.8 oz)   SpO2: 92% 92% 95% 95%    Intake/Output Summary (Last 24 hours) at 10/12/15 0926 Last data filed at 10/11/15 1831  Gross per 24 hour  Intake   1420 ml  Output      0 ml  Net   1420 ml    Exam:  Examination:  General exam: Appears calm and comfortable  Respiratory system: Wheezing. Respiratory effort normal. Cardiovascular system: S1 & S2 heard, RRR. No JVD,  murmurs, rubs, gallops or clicks. No pedal edema. Gastrointestinal system: Abdomen is nondistended, soft and nontender. No organomegaly or masses felt. Normal bowel sounds heard. Central nervous system: Alert and oriented. No focal neurological deficits. Extremities: Symmetric 5 x 5 power. Skin: No rashes, lesions or ulcers Psychiatry: Judgement and  insight appear normal. Mood & affect appropriate.     Data Reviewed: I have personally reviewed following labs and imaging studies  Micro Results Recent Results (from the past 240 hour(s))  Culture, blood (Routine X 2) w Reflex to ID Panel     Status: None (Preliminary result)   Collection Time: 10/09/15 12:50 PM  Result Value Ref Range Status   Specimen Description BLOOD RIGHT HAND  Final   Special Requests BOTTLES DRAWN AEROBIC AND ANAEROBIC 6CC  Final   Culture NO GROWTH 3 DAYS  Final   Report Status PENDING  Incomplete  Culture, blood (Routine X 2) w Reflex to ID Panel     Status: None (Preliminary result)   Collection Time: 10/09/15  1:02 PM  Result Value Ref Range Status   Specimen Description BLOOD LEFT HAND  Final   Special Requests BOTTLES DRAWN AEROBIC AND ANAEROBIC 6CC  Final   Culture NO GROWTH 3 DAYS  Final   Report Status PENDING  Incomplete  Urine culture     Status: Abnormal   Collection Time: 10/09/15  5:10 PM  Result Value Ref Range Status   Specimen Description URINE, CLEAN CATCH  Final   Special Requests NONE  Final   Culture (A)  Final    <10,000 COLONIES/mL INSIGNIFICANT GROWTH Performed at [facility]    Report Status 10/10/2015 FINAL  Final    Radiology Reports Dg Chest 2 View  09/29/2015  CLINICAL DATA:  Cough. EXAM: CHEST  2 VIEW COMPARISON:  Radiograph of July 08, 2015. FINDINGS: The heart size and mediastinal contours are within normal limits. No pneumothorax or pleural effusion is noted. Atherosclerosis of thoracic aorta is noted. Stable bilateral perihilar and basilar interstitial densities are noted consistent with scarring, although superimposed inflammation cannot be excluded. The visualized skeletal structures are unremarkable. IMPRESSION: Aortic atherosclerosis. Stable bilateral lung scarring is noted, although superimposed inflammation cannot be excluded. Electronically Signed   By: Marijo Conception, M.D.   On: 09/29/2015 15:48   Ct  Angio Chest Pe W/cm &/or Wo Cm  10/09/2015  CLINICAL DATA:  Shortness of breath.  Thyroid cancer. EXAM: CT ANGIOGRAPHY CHEST WITH CONTRAST TECHNIQUE: Multidetector CT imaging of the chest was performed using the standard protocol during bolus administration of intravenous contrast. Multiplanar CT image reconstructions and MIPs were obtained to evaluate the vascular anatomy. CONTRAST:  100 cc Isovue 370 IV. COMPARISON:  Chest radiograph from earlier today. 04/28/2015 chest CT angiogram. FINDINGS: Mediastinum/Nodes: The study is moderate quality for the evaluation of pulmonary embolism, with evaluation of the subsegmental pulmonary arteries limited by motion artifact. There are no filling defects in the central, lobar, segmental or subsegmental pulmonary artery branches to suggest acute pulmonary embolism. Atherosclerotic nonaneurysmal thoracic aorta. Main pulmonary diameter 3.0 cm is within normal limits. Normal heart size. Small pericardial effusion/ thickening measuring up to 7 mm thickness, not appreciably changed. Surgically absent thyroid. Unremarkable esophagus. No axillary adenopathy. New enlarged 2.1 cm right subcarinal node (series 4/ image 66). New mild confluent bilateral hilar adenopathy measuring up to 1.0 cm on the right (series 4/ image 57) and 1.2 cm on the left (series 4/ image 54). Lungs/Pleura: No pneumothorax. No pleural effusion. There  is extrinsic narrowing of the bilateral mainstem and lobar bronchi by the confluent subcarinal and bilateral hilar adenopathy. There is increased diffuse peribronchovascular interstitial thickening throughout both lungs. Moderate centrilobular emphysema. There are new mild patchy perilobular ground-glass opacities and small patchy nodular areas of consolidation in the bilateral peripheral lungs, most prominent in the dependent lower lobes, for example a 1.1 cm nodular focus of consolidation in the posterior right lower lobe (series 6/ image 84). Tiny right upper  lobe 3 mm pulmonary nodules are stable. Upper abdomen: New fat stranding in the upper omental fat. Bilateral retrocrural lymphadenopathy is increased, with enlarged calcified 1.4 cm right retrocrural node (series 4/ image 111), previously 0.9 cm and enlarged calcified 1.1 cm left retrocrural node, previously 1.0 cm. Calcified gastrohepatic ligament lymphadenopathy is increased, for example a 1.0 cm calcified gastrohepatic ligament node (series 4/ image 115), increased from 0.7 cm. Cholecystectomy. Musculoskeletal: No aggressive appearing focal osseous lesions. Nondisplaced healing anterior right second through sixth rib fractures. Mild thoracic spondylosis. Review of the MIP images confirms the above findings. IMPRESSION: 1. No pulmonary embolism. 2. New confluent subcarinal and bilateral hilar adenopathy, causing extrinsic narrowing of the bilateral central bronchi, worrisome for metastatic adenopathy. 3. Increased diffuse peribronchovascular interstitial thickening in both lungs, worrisome for lymphangitic tumor . 4. New mild patchy perilobular ground-glass opacities and small patchy nodular areas of consolidation in the bilateral peripheral lungs, probably inflammatory. 5. Stable small pericardial effusion/thickening . 6. Increased bilateral retrocrural and gastrohepatic ligament calcified adenopathy, most consistent with metastatic thyroid carcinoma adenopathy. 7. Healing subacute anterior right second through sixth rib fractures. 8. Moderate centrilobular emphysema. 9. Aortic atherosclerosis. Electronically Signed   By: Ilona Sorrel M.D.   On: 10/09/2015 17:39   Dg Chest Port 1 View  10/09/2015  CLINICAL DATA:  Cough. History of thyroid carcinoma. Recent pneumonia. EXAM: PORTABLE CHEST 1 VIEW COMPARISON:  September 29, 2015 FINDINGS: There is stable fibrotic type change in the mid and lower lung zones, somewhat more pronounced on the right than on the left. There is no new opacity. No frank edema or consolidation.  Heart size and pulmonary vascularity are normal. No adenopathy. There is atherosclerotic calcification in the aorta. Trachea is midline. No bone lesions evident. IMPRESSION: Stable fibrotic type change in the mid and lower lung zones, more pronounced on the right than on the left, stable. No new opacity. Stable cardiac silhouette. Aortic atherosclerosis noted. Electronically Signed   By: Lowella Grip III M.D.   On: 10/09/2015 12:58     CBC  Recent Labs Lab 10/09/15 1255 10/10/15 0433 10/11/15 0606 10/12/15 0633  WBC 6.2 6.4 5.8 3.3*  HGB 16.3* 13.3 12.1 12.3  HCT 49.6* 42.0 38.5 38.8  PLT 313 264 253 309  MCV 79.7 80.2 80.4 79.2  MCH 26.2 25.4* 25.3* 25.1*  MCHC 32.9 31.7 31.4 31.7  RDW 17.6* 18.0* 18.2* 18.4*  LYMPHSABS 0.7  --   --   --   MONOABS 0.8  --   --   --   EOSABS 0.0  --   --   --   BASOSABS 0.0  --   --   --     Chemistries   Recent Labs Lab 10/09/15 1255 10/10/15 0433 10/11/15 0606 10/12/15 0633  NA 133* 137 137 136  K 3.6 3.8 3.5 3.2*  CL 102 109 114* 111  CO2 20* 20* 17* 17*  GLUCOSE 115* 96 86 128*  BUN '10 11 6 '$ <5*  CREATININE 0.79 0.77 0.51 0.48  CALCIUM 9.3 8.3* 8.3* 8.5*  AST 24  --  14* 14*  ALT 15  --  10* 10*  ALKPHOS 99  --  59 70  BILITOT 0.6  --  0.6 0.9   ------------------------------------------------------------------------------------------------------------------ estimated creatinine clearance is 63.1 mL/min (by C-G formula based on Cr of 0.48). ------------------------------------------------------------------------------------------------------------------ No results for input(s): HGBA1C in the last 72 hours. ------------------------------------------------------------------------------------------------------------------ No results for input(s): CHOL, HDL, LDLCALC, TRIG, CHOLHDL, LDLDIRECT in the last 72  hours. ------------------------------------------------------------------------------------------------------------------ No results for input(s): TSH, T4TOTAL, T3FREE, THYROIDAB in the last 72 hours.  Invalid input(s): FREET3 ------------------------------------------------------------------------------------------------------------------ No results for input(s): VITAMINB12, FOLATE, FERRITIN, TIBC, IRON, RETICCTPCT in the last 72 hours.  Coagulation profile No results for input(s): INR, PROTIME in the last 168 hours.  No results for input(s): DDIMER in the last 72 hours.  Cardiac Enzymes No results for input(s): CKMB, TROPONINI, MYOGLOBIN in the last 168 hours.  Invalid input(s): CK ------------------------------------------------------------------------------------------------------------------ Invalid input(s): POCBNP   CBG: No results for input(s): GLUCAP in the last 168 hours.     Studies: No results found.    Lab Results  Component Value Date   HGBA1C 6.9* 03/17/2015   HGBA1C 7.2* 05/07/2013   Lab Results  Component Value Date   LDLCALC 65 07/28/2014   CREATININE 0.48 10/12/2015       Scheduled Meds: . enoxaparin (LOVENOX) injection  40 mg Subcutaneous Q24H  . guaiFENesin-dextromethorphan  5 mL Oral Q4H  . levalbuterol  1.25 mg Nebulization Q6H  . levothyroxine  112 mcg Oral QAC breakfast  . megestrol  400 mg Oral Daily  . methylPREDNISolone (SOLU-MEDROL) injection  60 mg Intravenous Q12H  . pantoprazole  40 mg Oral BID  . potassium chloride SA  40 mEq Oral BID  . RESOURCE THICKENUP CLEAR   Oral Daily  . sertraline  100 mg Oral Daily   Continuous Infusions: . 0.9 % sodium chloride with kcl       LOS: 3 days    Time spent: >30 MINS    [facility] Pager [phone]. If 7PM-7AM, please contact night-coverage at [email or web address], password [facility] 10/12/2015, 9:26 AM  LOS: 3 days

## 2015-10-12 NOTE — Progress Notes (Signed)
Pharmacy Antibiotic Note  Haley Lowe is a 60 y.o. female admitted on 10/09/2015 with pneumonia.  Pharmacy has been consulted for levaquin dosing for 3 days  Plan: Levaquin 750 mg IV every 24 hours for 3 doses F/U oral therapy when appropriate F/u renal function, cultures and clinical course  Height: '5\' 4"'$  (162.6 cm) Weight: 116 lb 4.8 oz (52.753 kg) IBW/kg (Calculated) : 54.7  Temp (24hrs), Avg:98.3 F (36.8 C), Min:98.2 F (36.8 C), Max:98.3 F (36.8 C)   Recent Labs Lab 10/09/15 1255 10/10/15 0433 10/11/15 0606 10/12/15 0633  WBC 6.2 6.4 5.8 3.3*  CREATININE 0.79 0.77 0.51 0.48  LATICACIDVEN 1.8  --   --   --     Estimated Creatinine Clearance: 63.1 mL/min (by C-G formula based on Cr of 0.48).    Allergies  Allergen Reactions  . Penicillins Swelling    " my brain swelled, and mymouth" Has patient had a PCN reaction causing immediate rash, facial/tongue/throat swelling, SOB or lightheadedness with hypotension: Yes Has patient had a PCN reaction causing severe rash involving mucus membranes or skin necrosis: No Has patient had a PCN reaction that required hospitalization No Has patient had a PCN reaction occurring within the last 10 years: No If all of the above answers are "NO", then may proceed with Cephalosporin use.   . Other Swelling    PT ALSO REACTS TO PAPER TAPE AND BAND-AIDS.  Marland Kitchen Codeine Itching    Mild takes benadryl  . Potassium-Containing Compounds Other (See Comments)    Potassium Liquid only- burns the mouth and throat, pt is able to take tablets dissolved.  . Tape Rash    Antimicrobials this admission: Levaquin 7/9 >> primaxin 7/7 >> 7/9 vancomycin 7/6 >> 7/9   Thank you for allowing pharmacy to be a part of this patient's care.  Chriss Czar 10/12/2015 10:22 AM

## 2015-10-13 LAB — COMPREHENSIVE METABOLIC PANEL
ALT: 8 U/L — ABNORMAL LOW (ref 14–54)
AST: 11 U/L — AB (ref 15–41)
Albumin: 2.8 g/dL — ABNORMAL LOW (ref 3.5–5.0)
Alkaline Phosphatase: 62 U/L (ref 38–126)
Anion gap: 5 (ref 5–15)
BUN: 5 mg/dL — AB (ref 6–20)
CHLORIDE: 110 mmol/L (ref 101–111)
CO2: 20 mmol/L — AB (ref 22–32)
Calcium: 8.2 mg/dL — ABNORMAL LOW (ref 8.9–10.3)
Creatinine, Ser: 0.45 mg/dL (ref 0.44–1.00)
Glucose, Bld: 120 mg/dL — ABNORMAL HIGH (ref 65–99)
POTASSIUM: 3.9 mmol/L (ref 3.5–5.1)
SODIUM: 135 mmol/L (ref 135–145)
Total Bilirubin: 0.5 mg/dL (ref 0.3–1.2)
Total Protein: 5.6 g/dL — ABNORMAL LOW (ref 6.5–8.1)

## 2015-10-13 LAB — CBC
HEMATOCRIT: 36.2 % (ref 36.0–46.0)
Hemoglobin: 11.6 g/dL — ABNORMAL LOW (ref 12.0–15.0)
MCH: 25.3 pg — ABNORMAL LOW (ref 26.0–34.0)
MCHC: 32 g/dL (ref 30.0–36.0)
MCV: 79 fL (ref 78.0–100.0)
Platelets: 263 10*3/uL (ref 150–400)
RBC: 4.58 MIL/uL (ref 3.87–5.11)
RDW: 18.7 % — ABNORMAL HIGH (ref 11.5–15.5)
WBC: 4.7 10*3/uL (ref 4.0–10.5)

## 2015-10-13 MED ORDER — PREDNISONE 10 MG PO TABS: ORAL_TABLET | ORAL | Status: DC

## 2015-10-13 MED ORDER — ALBUTEROL SULFATE HFA 108 (90 BASE) MCG/ACT IN AERS
2.0000 | INHALATION_SPRAY | Freq: Four times a day (QID) | RESPIRATORY_TRACT | Status: AC | PRN
Start: 2015-10-13 — End: ?

## 2015-10-13 MED ORDER — MAGNESIUM OXIDE 400 (241.3 MG) MG PO TABS
400.0000 mg | ORAL_TABLET | Freq: Two times a day (BID) | ORAL | Status: DC
  Administered 2015-10-13: 400 mg via ORAL
  Filled 2015-10-13: qty 1

## 2015-10-13 MED ORDER — POTASSIUM CHLORIDE 25 MEQ PO PACK: 25.0000 meq | PACK | Freq: Two times a day (BID) | ORAL | Status: DC

## 2015-10-13 MED ORDER — HYDROCOD POLST-CPM POLST ER 10-8 MG/5ML PO SUER: 5.0000 mL | Freq: Two times a day (BID) | ORAL | Status: AC | PRN

## 2015-10-13 MED ORDER — LEVOFLOXACIN 750 MG PO TABS: 750.0000 mg | ORAL_TABLET | Freq: Every day | ORAL | Status: DC

## 2015-10-13 MED ORDER — MOMETASONE FURO-FORMOTEROL FUM 200-5 MCG/ACT IN AERO: 2.0000 | INHALATION_SPRAY | Freq: Two times a day (BID) | RESPIRATORY_TRACT | Status: DC

## 2015-10-13 MED ORDER — MAGNESIUM OXIDE 400 (241.3 MG) MG PO TABS: 400.0000 mg | ORAL_TABLET | Freq: Two times a day (BID) | ORAL | Status: AC

## 2015-10-13 MED ORDER — LEVOFLOXACIN 750 MG PO TABS
750.0000 mg | ORAL_TABLET | Freq: Every day | ORAL | Status: DC
  Filled 2015-10-13: qty 1

## 2015-10-13 NOTE — Progress Notes (Signed)
Discharge instructions given on medications,and follow up visits,patient verbalized understanding.Prescriptions sent to Pharmacy of choice documented on AVS. Accompanied by staff to an awaiting vehicle.

## 2015-10-13 NOTE — Care Management Important Message (Signed)
Important Message  Patient Details  Name: Sirinity Outland MRN: [id number] Date of Birth: May 08, 1955   Medicare Important Message Given:  Yes    Traeger Sultana, Chauncey Reading, RN 10/13/2015, 9:13 AM

## 2015-10-13 NOTE — Progress Notes (Signed)
Pharmacy Antibiotic Note  Haley Lowe is a 60 y.o. female admitted on 10/09/2015 with pneumonia.  Pharmacy has been consulted for levaquin dosing for 3 days total   PHARMACIST - PHYSICIAN COMMUNICATION CONCERNING: Antibiotic IV to Oral Route Change Policy  This patient is receiving LEVAQUIN by the intravenous route.  Based on criteria approved by the Pharmacy and Therapeutics Committee, the antibiotic(s) is/are being converted to the equivalent oral dose form(s).  DESCRIPTION: These criteria include:  Patient being treated for a respiratory tract infection, urinary tract infection, cellulitis or clostridium difficile associated diarrhea if on metronidazole  The patient is not neutropenic and does not exhibit a GI malabsorption state  The patient is eating (either orally or via tube) and/or has been taking other orally administered medications for a least 24 hours  The patient is improving clinically and has a Tmax < 100.5  If you have questions about this conversion, please contact the Pharmacy Department  '[x]'$   [phone] )  [facility] '[]'$   [phone] )  [facility] '[]'$   [phone] )  [facility] '[]'$   [phone] )  [facility] '[]'$   [phone] )  [place]: Levaquin 750 mg PO every 24 hours for 2 more doses Pharmacy will sign off.   Height: '5\' 4"'$  (162.6 cm) Weight: 118 lb 14.4 oz (53.933 kg) IBW/kg (Calculated) : 54.7  Temp (24hrs), Avg:98.3 F (36.8 C), Min:98 F (36.7 C), Max:98.6 F (37 C)   Recent Labs Lab 10/09/15 1255 10/10/15 0433 10/11/15 0606 10/12/15 0633 10/13/15 0400  WBC 6.2 6.4 5.8 3.3* 4.7  CREATININE 0.79 0.77 0.51 0.48 0.45  LATICACIDVEN 1.8  --   --   --   --     Estimated Creatinine Clearance: 64.4 mL/min (by C-G formula based on Cr of 0.45).    Allergies  Allergen Reactions  . Penicillins Swelling    " my brain swelled, and mymouth" Has patient had a PCN reaction causing immediate rash,  facial/tongue/throat swelling, SOB or lightheadedness with hypotension: Yes Has patient had a PCN reaction causing severe rash involving mucus membranes or skin necrosis: No Has patient had a PCN reaction that required hospitalization No Has patient had a PCN reaction occurring within the last 10 years: No If all of the above answers are "NO", then may proceed with Cephalosporin use.   . Other Swelling    PT ALSO REACTS TO PAPER TAPE AND BAND-AIDS.  Marland Kitchen Codeine Itching    Mild takes benadryl  . Potassium-Containing Compounds Other (See Comments)    Potassium Liquid only- burns the mouth and throat, pt is able to take tablets dissolved.  . Tape Rash   Antimicrobials this admission: Levaquin 7/9 >>7/11 primaxin 7/7 >> 7/9 vancomycin 7/6 >> 7/9  Thank you for allowing pharmacy to be a part of this patient's care.  Hayman Robinsons A 10/13/2015 11:06 AM

## 2015-10-13 NOTE — Evaluation (Signed)
Physical Therapy Evaluation Patient Details Name: Haley Lowe MRN: [id number] DOB: November 02, 1955 Today's Date: 10/13/2015   History of Present Illness  60 yo F admitted 10/09/2015 with increased productive cough, generalized weakness. Dx: PNA on 6/26 and sent to the ED from oncology. DX: acute COPD exacerbation/lymphangitic spread of tumor. PMH metastatic thyroid CA, with chronic neck pain from multiple resections on nodes and CA, brain tumor resection in 2013, dysphagia, aspiration PNA, thrush, dehydration and recurrent PNA, anxiety, depression, HTN, COPD, GERD, arthritis, adenomatous colonic polyps, emphysema, Goiter, fibromyalgia, pulmonary emboli R LL, lung nodule, DVT, radiation therapy, UTI, borderline DM, cholecystectomy, shoulder surgery, foot surgery, total thyroidectomy in 2015  Clinical Impression  Pt received in bed, husband present, and pt is agreeable to PT evaluation.  Husband answers most of the history questions.  Pt normally ambulates with assistance from husband due to posterior lean.  She states that she only is able to go from room to room at this point.  She requires assistance for both dressing and bathing at this time.  During today's evaluation SpO2 remained WNL except when she was sitting on the edge of the bed and scooting forward it dropped to 83% on RA likely due to valsalva, but quickly improved back to 96% with a short rest.  Pt ambulated 37f total - first 165fwith no RW but with Mod A, and 2nd 1577fith RW and continued Mod A due to strong posterior lean.  Pt and husband understand the strong recommendations for someone to be with her assisting her at all times when she is ambulating due to this posterior lean.  They already have a gait belt that they use.  She is recommended for HHPT upon d/c, and [facility], as well as ADL training.      Follow Up Recommendations Home health PT;Other (comment);Supervision/Assistance - 24  hour (HHOT)    Equipment Recommendations  None recommended by PT    Recommendations for Other Services       Precautions / Restrictions Precautions Precautions: Fall Precaution Comments: Due to significant posterior lean Restrictions Weight Bearing Restrictions: No      Mobility  Bed Mobility Overal bed mobility: Modified Independent             General bed mobility comments: HOB raised, and increased time.  Husband states that sometimes at home she does demonstrate posterior lean or lean to one side.  Husband states that the balance issues only started when she started radiation.   Transfers Overall transfer level: Needs assistance Equipment used: None Transfers: Sit to/from Stand Sit to Stand: Mod assist         General transfer comment: Due to strong posterior lean when going to power up  Ambulation/Gait Ambulation/Gait assistance: Mod assist Ambulation Distance (Feet): 30 Feet Assistive device: Rolling walker (2 wheeled) Gait Pattern/deviations: Trunk flexed;Narrow base of support;Shuffle     General Gait Details: Pt demonstrates shuffled gait pattern.  Ambulated 68f82fth HHA, but continued with strong posterior lean, therefore instruced on using RW, and PT assisted with RW navigation, as well as anterior weighting.    Stairs            Wheelchair Mobility    Modified Rankin (Stroke Patients Only)       Balance Overall balance assessment: Needs assistance Sitting-balance support: Bilateral upper extremity supported Sitting balance-Leahy Scale: Good     Standing balance support: Bilateral upper extremity supported Standing balance-Leahy Scale: Poor Standing balance  comment: strong posterior lean - improves with fwd progression of gait.                              Pertinent Vitals/Pain Pain Assessment: 0-10 Pain Score: 4  Pain Location: throat Pain Descriptors / Indicators: Constant Pain Intervention(s): Limited activity  within patient's tolerance    Home Living   Living Arrangements: Spouse/significant other Available Help at Discharge: Family (dtr's and family) Type of Home: House Home Access: Stairs to enter Entrance Stairs-Rails: Right Entrance Stairs-Number of Steps: 3-4 Home Layout: One level Home Equipment: Walker - 2 wheels;Cane - single point;Shower seat;Wheelchair - Runner, broadcasting/film/video / Transfers Assistance Needed: Pt's husband ambulates with her all the time due to poor balance with leaning backwards.   ADL's / Homemaking Assistance Needed: husband assists with cooking/cleaning.  pt requires assistance for both dressing and bathing. Had just started with advanced home care services.          Hand Dominance   Dominant Hand: Right    Extremity/Trunk Assessment   Upper Extremity Assessment: Overall WFL for tasks assessed           Lower Extremity Assessment: Overall WFL for tasks assessed         Communication   Communication:  (Husband answering most of the questions today. )  Cognition Arousal/Alertness: Awake/alert Behavior During Therapy: WFL for tasks assessed/performed Overall Cognitive Status: Within Functional Limits for tasks assessed                      General Comments      Exercises        Assessment/Plan    PT Assessment Patient needs continued PT services  PT Diagnosis Difficulty walking;Abnormality of gait;Generalized weakness   PT Problem List Decreased strength;Decreased activity tolerance;Decreased balance;Decreased mobility;Decreased coordination;Decreased cognition;Decreased knowledge of use of DME;Decreased safety awareness  PT Treatment Interventions DME instruction;Gait training;Stair training;Functional mobility training;Therapeutic activities;Therapeutic exercise;Balance training;Patient/family education;Cognitive remediation;Neuromuscular re-education   PT Goals (Current goals can be found in the  Care Plan section) Acute Rehab PT Goals Patient Stated Goal: Pt states that she wants to go back home PT Goal Formulation: With patient/family Time For Goal Achievement: 10/20/15 Potential to Achieve Goals: Fair    Frequency Min 4X/week   Barriers to discharge        Co-evaluation               End of Session Equipment Utilized During Treatment: Gait belt Activity Tolerance: Patient tolerated treatment well Patient left: in bed;with call bell/phone within reach      Functional Assessment Tool Used: [employer] "6-clicks"  Functional Limitation: Mobility: Walking and moving around Mobility: Walking and Moving Around Current Status [phone]): At least 40 percent but less than 60 percent impaired, limited or restricted Mobility: Walking and Moving Around Goal Status [phone]): At least 20 percent but less than 40 percent impaired, limited or restricted    Time: 0952-1015 PT Time Calculation (min) (ACUTE ONLY): 23 min   Charges:   PT Evaluation $PT Eval Low Complexity: 1 Procedure PT Treatments $Gait Training: 8-22 mins   PT G Codes:   PT G-Codes **NOT FOR INPATIENT CLASS** Functional Assessment Tool Used: [employer] "6-clicks"  Functional Limitation: Mobility: Walking and moving around Mobility: Walking and Moving Around Current Status [phone]): At least 40 percent but less than 60  percent impaired, limited or restricted Mobility: Walking and Moving Around Goal Status [phone]): At least 20 percent but less than 40 percent impaired, limited or restricted   Beth Gunhild Bautch, PT, DPT X: [phone]

## 2015-10-13 NOTE — Discharge Summary (Addendum)
Physician Discharge Summary  Haley Lowe MRN: [id number] DOB/AGE: 08-18-1955 60 y.o.  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)   Primary papillary thyroid carcinoma (HCC)   COPD (chronic obstructive pulmonary disease) (HCC)   Dehydration   Dysphagia   Metastatic cancer (HCC)   DNR (do not resuscitate) discussion   Generalized weakness   Cough    Follow-up recommendations Follow-up with PCP in 3-5 days , including all  additional recommended appointments as below Follow-up CBC, CMP in 3-5 days Patient is encouraged to follow-up with her primary oncologist to discuss CT findings, further course of treatment and prognosis     Current Discharge Medication List    START taking these medications   Details  albuterol (PROVENTIL HFA;VENTOLIN HFA) 108 (90 Base) MCG/ACT inhaler Inhale 2 puffs into the lungs every 6 (six) hours as needed for wheezing or shortness of breath. Qty: 1 Inhaler, Refills: 2    chlorpheniramine-HYDROcodone (TUSSIONEX PENNKINETIC ER) 10-8 MG/5ML SUER Take 5 mLs by mouth every 12 (twelve) hours as needed for cough. Qty: 250 mL, Refills: 0    levofloxacin (LEVAQUIN) 750 MG tablet Take 1 tablet (750 mg total) by mouth daily. Qty: 5 tablet, Refills: 0    magnesium oxide (MAG-OX) 400 (241.3 Mg) MG tablet Take 1 tablet (400 mg total) by mouth 2 (two) times daily. Qty: 60 tablet, Refills: 1    mometasone-formoterol (DULERA) 200-5 MCG/ACT AERO Inhale 2 puffs into the lungs 2 (two) times daily. Qty: 13 g, Refills: 2    Potassium Chloride 25 MEQ PACK Take 25 mEq by mouth 2 (two) times daily at 10 AM and 5 PM. Qty: 60 each, Refills: 0    predniSONE (DELTASONE) 10 MG tablet 6 tablets for 3 days, 5 tablets for 3 days, 4 tablets for 3 days,  3 tablets for 3 days, 2 tablets for 3 days, 1 tablet for 3 days Qty: 120 tablet, Refills: 0      CONTINUE these medications which have NOT CHANGED   Details  acetaminophen (TYLENOL) 325 MG tablet Take 2 tablets (650 mg total) by mouth every 6 (six) hours as needed for mild pain.    albuterol (PROVENTIL) (2.5 MG/3ML) 0.083% nebulizer solution Take 3 mLs (2.5 mg total) by nebulization every 4 (four) hours as needed for wheezing or shortness of breath. Qty: 75 mL, Refills: 12    levothyroxine (SYNTHROID, LEVOTHROID) 112 MCG tablet Take 112 mcg by mouth daily before breakfast.    magic mouthwash w/lidocaine SOLN Take 5 mLs by mouth 4 (four) times daily as needed for mouth pain. Qty: 400 mL, Refills: 0    megestrol (MEGACE) 400 MG/10ML suspension Take 10 mLs (400 mg total) by mouth daily. Qty: 240 mL, Refills: 0    ondansetron (ZOFRAN ODT) 8 MG disintegrating tablet Take 1 tablet (8 mg total) by mouth every 8 (eight) hours as needed for nausea or vomiting. Qty: 20 tablet, Refills: 0    Oxycodone HCl 20 MG TABS Take 1 tablet by mouth. Every 1 to 2 hours.    pantoprazole (PROTONIX) 40 MG tablet Take 1 tablet (40 mg total) by mouth 2 (two) times daily. Qty: 60 tablet, Refills: 0    polyethylene glycol (MIRALAX / GLYCOLAX) packet Take 17 g by mouth daily as needed for moderate constipation. Reported on  04/27/2015    potassium chloride SA (K-DUR,KLOR-CON) 20 MEQ tablet Take 40 mEq by mouth 2 (two) times daily. Dissolve in water and take either in applesauce or drink the water.    sertraline (ZOLOFT) 50 MG tablet Take 50 mg by mouth daily.    Starch-Maltodextrin (THICK-IT PO) Take by mouth daily. Add to all liquid.          Discharge Condition: Prognosis poor   Discharge Instructions Get Medicines reviewed and adjusted: Please take all your medications with you for your next visit with your Primary MD  Please request your Primary MD to go over all hospital tests and  procedure/radiological results at the follow up, please ask your Primary MD to get all Hospital records sent to his/her office.  If you experience worsening of your admission symptoms, develop shortness of breath, life threatening emergency, suicidal or homicidal thoughts you must seek medical attention immediately by calling 911 or calling your MD immediately if symptoms less severe.  You must read complete instructions/literature along with all the possible adverse reactions/side effects for all the Medicines you take and that have been prescribed to you. Take any new Medicines after you have completely understood and accpet all the possible adverse reactions/side effects.   Do not drive when taking Pain medications.   Do not take more than prescribed Pain, Sleep and Anxiety Medications  Special Instructions: If you have smoked or chewed Tobacco in the last 2 yrs please stop smoking, stop any regular Alcohol and or any Recreational drug use.  Wear Seat belts while driving.  Please note  You were cared for by a hospitalist during your hospital stay. Once you are discharged, your primary care physician will handle any further medical issues. Please note that NO REFILLS for any discharge medications will be authorized once you are discharged, as it is imperative that you return to your primary care physician (or establish a relationship with a primary care physician if you do not have one) for your aftercare needs so that they can reassess your need for medications and monitor your lab values.     Allergies  Allergen Reactions  . Penicillins Swelling    " my brain swelled, and mymouth" Has patient had a PCN reaction causing immediate rash, facial/tongue/throat swelling, SOB or lightheadedness with hypotension: Yes Has patient had a PCN reaction causing severe rash involving mucus membranes or skin necrosis: No Has patient had a PCN reaction that required hospitalization No Has patient  had a PCN reaction occurring within the last 10 years: No If all of the above answers are "NO", then may proceed with Cephalosporin use.   . Other Swelling    PT ALSO REACTS TO PAPER TAPE AND BAND-AIDS.  Marland Kitchen Codeine Itching    Mild takes benadryl  . Potassium-Containing Compounds Other (See Comments)    Potassium Liquid only- burns the mouth and throat, pt is able to take tablets dissolved.  . Tape Rash      Disposition: Home with family   Consults:  None     Significant Diagnostic Studies:  Dg Chest 2 View  09/29/2015  CLINICAL DATA:  Cough. EXAM: CHEST  2 VIEW COMPARISON:  Radiograph of July 08, 2015. FINDINGS: The heart size and mediastinal contours are within normal limits. No pneumothorax or pleural effusion is noted. Atherosclerosis of thoracic aorta is noted. Stable bilateral perihilar and basilar interstitial densities are noted consistent with scarring, although superimposed inflammation cannot be excluded. The visualized skeletal structures are unremarkable. IMPRESSION:  Aortic atherosclerosis. Stable bilateral lung scarring is noted, although superimposed inflammation cannot be excluded. Electronically Signed   By: Marijo Conception, M.D.   On: 09/29/2015 15:48   Ct Angio Chest Pe W/cm &/or Wo Cm  10/09/2015  CLINICAL DATA:  Shortness of breath.  Thyroid cancer. EXAM: CT ANGIOGRAPHY CHEST WITH CONTRAST TECHNIQUE: Multidetector CT imaging of the chest was performed using the standard protocol during bolus administration of intravenous contrast. Multiplanar CT image reconstructions and MIPs were obtained to evaluate the vascular anatomy. CONTRAST:  100 cc Isovue 370 IV. COMPARISON:  Chest radiograph from earlier today. 04/28/2015 chest CT angiogram. FINDINGS: Mediastinum/Nodes: The study is moderate quality for the evaluation of pulmonary embolism, with evaluation of the subsegmental pulmonary arteries limited by motion artifact. There are no filling defects in the central, lobar,  segmental or subsegmental pulmonary artery branches to suggest acute pulmonary embolism. Atherosclerotic nonaneurysmal thoracic aorta. Main pulmonary diameter 3.0 cm is within normal limits. Normal heart size. Small pericardial effusion/ thickening measuring up to 7 mm thickness, not appreciably changed. Surgically absent thyroid. Unremarkable esophagus. No axillary adenopathy. New enlarged 2.1 cm right subcarinal node (series 4/ image 66). New mild confluent bilateral hilar adenopathy measuring up to 1.0 cm on the right (series 4/ image 57) and 1.2 cm on the left (series 4/ image 54). Lungs/Pleura: No pneumothorax. No pleural effusion. There is extrinsic narrowing of the bilateral mainstem and lobar bronchi by the confluent subcarinal and bilateral hilar adenopathy. There is increased diffuse peribronchovascular interstitial thickening throughout both lungs. Moderate centrilobular emphysema. There are new mild patchy perilobular ground-glass opacities and small patchy nodular areas of consolidation in the bilateral peripheral lungs, most prominent in the dependent lower lobes, for example a 1.1 cm nodular focus of consolidation in the posterior right lower lobe (series 6/ image 84). Tiny right upper lobe 3 mm pulmonary nodules are stable. Upper abdomen: New fat stranding in the upper omental fat. Bilateral retrocrural lymphadenopathy is increased, with enlarged calcified 1.4 cm right retrocrural node (series 4/ image 111), previously 0.9 cm and enlarged calcified 1.1 cm left retrocrural node, previously 1.0 cm. Calcified gastrohepatic ligament lymphadenopathy is increased, for example a 1.0 cm calcified gastrohepatic ligament node (series 4/ image 115), increased from 0.7 cm. Cholecystectomy. Musculoskeletal: No aggressive appearing focal osseous lesions. Nondisplaced healing anterior right second through sixth rib fractures. Mild thoracic spondylosis. Review of the MIP images confirms the above findings.  IMPRESSION: 1. No pulmonary embolism. 2. New confluent subcarinal and bilateral hilar adenopathy, causing extrinsic narrowing of the bilateral central bronchi, worrisome for metastatic adenopathy. 3. Increased diffuse peribronchovascular interstitial thickening in both lungs, worrisome for lymphangitic tumor . 4. New mild patchy perilobular ground-glass opacities and small patchy nodular areas of consolidation in the bilateral peripheral lungs, probably inflammatory. 5. Stable small pericardial effusion/thickening . 6. Increased bilateral retrocrural and gastrohepatic ligament calcified adenopathy, most consistent with metastatic thyroid carcinoma adenopathy. 7. Healing subacute anterior right second through sixth rib fractures. 8. Moderate centrilobular emphysema. 9. Aortic atherosclerosis. Electronically Signed   By: Ilona Sorrel M.D.   On: 10/09/2015 17:39   Dg Chest Port 1 View  10/09/2015  CLINICAL DATA:  Cough. History of thyroid carcinoma. Recent pneumonia. EXAM: PORTABLE CHEST 1 VIEW COMPARISON:  September 29, 2015 FINDINGS: There is stable fibrotic type change in the mid and lower lung zones, somewhat more pronounced on the right than on the left. There is no new opacity. No frank edema or consolidation. Heart size and pulmonary vascularity are normal.  No adenopathy. There is atherosclerotic calcification in the aorta. Trachea is midline. No bone lesions evident. IMPRESSION: Stable fibrotic type change in the mid and lower lung zones, more pronounced on the right than on the left, stable. No new opacity. Stable cardiac silhouette. Aortic atherosclerosis noted. Electronically Signed   By: Lowella Grip III M.D.   On: 10/09/2015 12:58        Filed Weights   10/11/15 6644 10/12/15 0625 10/13/15 0615  Weight: 54.795 kg (120 lb 12.8 oz) 52.753 kg (116 lb 4.8 oz) 53.933 kg (118 lb 14.4 oz)     Microbiology: Recent Results (from the past 240 hour(s))  Culture, blood (Routine X 2) w Reflex to ID  Panel     Status: None (Preliminary result)   Collection Time: 10/09/15 12:50 PM  Result Value Ref Range Status   Specimen Description BLOOD RIGHT HAND  Final   Special Requests BOTTLES DRAWN AEROBIC AND ANAEROBIC 6CC  Final   Culture NO GROWTH 3 DAYS  Final   Report Status PENDING  Incomplete  Culture, blood (Routine X 2) w Reflex to ID Panel     Status: None (Preliminary result)   Collection Time: 10/09/15  1:02 PM  Result Value Ref Range Status   Specimen Description BLOOD LEFT HAND  Final   Special Requests BOTTLES DRAWN AEROBIC AND ANAEROBIC 6CC  Final   Culture NO GROWTH 3 DAYS  Final   Report Status PENDING  Incomplete  Urine culture     Status: Abnormal   Collection Time: 10/09/15  5:10 PM  Result Value Ref Range Status   Specimen Description URINE, CLEAN CATCH  Final   Special Requests NONE  Final   Culture (A)  Final    <10,000 COLONIES/mL INSIGNIFICANT GROWTH Performed at [facility]    Report Status 10/10/2015 FINAL  Final       Blood Culture    Component Value Date/Time   SDES URINE, CLEAN CATCH 10/09/2015 1710   SPECREQUEST NONE 10/09/2015 1710   CULT * 10/09/2015 1710    <10,000 COLONIES/mL INSIGNIFICANT GROWTH Performed at [place] 10/10/2015 FINAL 10/09/2015 1710      Labs: Results for orders placed or performed during the hospital encounter of 10/09/15 (from the past 48 hour(s))  CBC     Status: Abnormal   Collection Time: 10/12/15  6:33 AM  Result Value Ref Range   WBC 3.3 (L) 4.0 - 10.5 K/uL   RBC 4.90 3.87 - 5.11 MIL/uL   Hemoglobin 12.3 12.0 - 15.0 g/dL   HCT 38.8 36.0 - 46.0 %   MCV 79.2 78.0 - 100.0 fL   MCH 25.1 (L) 26.0 - 34.0 pg   MCHC 31.7 30.0 - 36.0 g/dL   RDW 18.4 (H) 11.5 - 15.5 %   Platelets 309 150 - 400 K/uL  Comprehensive metabolic panel     Status: Abnormal   Collection Time: 10/12/15  6:33 AM  Result Value Ref Range   Sodium 136 135 - 145 mmol/L   Potassium 3.2 (L) 3.5 - 5.1 mmol/L    Chloride 111 101 - 111 mmol/L   CO2 17 (L) 22 - 32 mmol/L   Glucose, Bld 128 (H) 65 - 99 mg/dL   BUN <5 (L) 6 - 20 mg/dL   Creatinine, Ser 0.48 0.44 - 1.00 mg/dL   Calcium 8.5 (L) 8.9 - 10.3 mg/dL   Total Protein 5.9 (L) 6.5 - 8.1 g/dL   Albumin 2.8 (L)  3.5 - 5.0 g/dL   AST 14 (L) 15 - 41 U/L   ALT 10 (L) 14 - 54 U/L   Alkaline Phosphatase 70 38 - 126 U/L   Total Bilirubin 0.9 0.3 - 1.2 mg/dL   GFR calc non Af Amer >60 >60 mL/min   GFR calc Af Amer >60 >60 mL/min    Comment: (NOTE) The eGFR has been calculated using the CKD EPI equation. This calculation has not been validated in all clinical situations. eGFR's persistently <60 mL/min signify possible Chronic Kidney Disease.    Anion gap 8 5 - 15  Magnesium     Status: Abnormal   Collection Time: 10/12/15  6:33 AM  Result Value Ref Range   Magnesium 1.5 (L) 1.7 - 2.4 mg/dL  Comprehensive metabolic panel     Status: Abnormal   Collection Time: 10/13/15  4:00 AM  Result Value Ref Range   Sodium 135 135 - 145 mmol/L   Potassium 3.9 3.5 - 5.1 mmol/L    Comment: DELTA CHECK NOTED   Chloride 110 101 - 111 mmol/L   CO2 20 (L) 22 - 32 mmol/L   Glucose, Bld 120 (H) 65 - 99 mg/dL   BUN 5 (L) 6 - 20 mg/dL   Creatinine, Ser 0.45 0.44 - 1.00 mg/dL   Calcium 8.2 (L) 8.9 - 10.3 mg/dL   Total Protein 5.6 (L) 6.5 - 8.1 g/dL   Albumin 2.8 (L) 3.5 - 5.0 g/dL   AST 11 (L) 15 - 41 U/L   ALT 8 (L) 14 - 54 U/L   Alkaline Phosphatase 62 38 - 126 U/L   Total Bilirubin 0.5 0.3 - 1.2 mg/dL   GFR calc non Af Amer >60 >60 mL/min   GFR calc Af Amer >60 >60 mL/min    Comment: (NOTE) The eGFR has been calculated using the CKD EPI equation. This calculation has not been validated in all clinical situations. eGFR's persistently <60 mL/min signify possible Chronic Kidney Disease.    Anion gap 5 5 - 15  CBC     Status: Abnormal   Collection Time: 10/13/15  4:00 AM  Result Value Ref Range   WBC 4.7 4.0 - 10.5 K/uL   RBC 4.58 3.87 - 5.11 MIL/uL    Hemoglobin 11.6 (L) 12.0 - 15.0 g/dL   HCT 36.2 36.0 - 46.0 %   MCV 79.0 78.0 - 100.0 fL   MCH 25.3 (L) 26.0 - 34.0 pg   MCHC 32.0 30.0 - 36.0 g/dL   RDW 18.7 (H) 11.5 - 15.5 %   Platelets 263 150 - 400 K/uL     Lipid Panel     Component Value Date/Time   CHOL 152 07/28/2014 2357   TRIG 234* 07/28/2014 2357   HDL 40 07/28/2014 2357   CHOLHDL 3.8 07/28/2014 2357   VLDL 47* 07/28/2014 2357   LDLCALC 65 07/28/2014 2357     Lab Results  Component Value Date   HGBA1C 6.9* 03/17/2015   HGBA1C 7.2* 05/07/2013      History of present illness Haley Lowe is a 60 y.o. female with history of metastatic thyroid cancer presenting with increased productive cough, gen weakness, poor po intake for several days. Unable to get OOB now. She was dx'd with PNA no 6/26 and had been getting every other day Ceftriaxone at [facility] through 6/30 w/o relief. Today was seen by oncology and they sent her to ED for hospital admission. Found to have worsening metastatic disease of the lung ,  possibly pneumonia/COPD exacerbation   Assessment and plan  1. Acute COPD exacerbation/lymphangitic spread of tumor, CT scan showed no PE, hilar lymphadenopathy causing extrinsic narrowing of bilateral central bronchi worrisome for metastatic adenopathy, CT scan worrisome for lymphangitic tumor spread, new mild patchy groundglass opacities, centrilobular emphysema, suspect component of pneumonia, initially treated with imipenem and vancomycin 2 days, subsequently switched to Levaquin, continue steroids and scheduled breathing treatments. Patient has been started on [facility] and albuterol inhaler  2. Dehydration -hydrated aggressively with IV fluids with potassium, daughter reports chronic diarrhea, decreasing oral intake and patient today is refusing EGD and PEG tube, GI consultation has been canceled  3. Dysphagia, chronic after radiation to chest - takes dysphagia diet/ blended, all liquids w ThickIt.  Refusing PEG tube and also refusing palliative care consultation. Final speech therapy consult for final recommendations pending   4. Metastatic thyroid cancer - health declining last 4-5 mos, pt wants DNR status but not ready for hospice. Cancer worsening by CT chest today. Could have lymphangitic spread in the lungs, encourage patient and her daughter to discuss prognosis with her regular oncologist  5. Acute COPD exacerbation -continue nebulizer treatments, prednisone taper, Tussionex   6. Anxiety/ depression-stable  7. Pyuria, f/u urine cx   DVT prophylaxsis Lovenox  Code Status: DO NOT RESUSCITATE   Discharge Exam:    Blood pressure 145/92, pulse 97, temperature 98.6 F (37 C), temperature source Oral, resp. rate 18, height '5\' 4"'$  (1.626 m), weight 53.933 kg (118 lb 14.4 oz), SpO2 96 %.   General exam: Appears calm and comfortable , chronically ill appearing Respiratory system: Wheezing. Respiratory effort normal. Cardiovascular system: S1 & S2 heard, RRR. No JVD, murmurs, rubs, gallops or clicks. No pedal edema. Gastrointestinal system: Abdomen is nondistended, soft and nontender. No organomegaly or masses felt. Normal bowel sounds heard. Central nervous system: Alert and oriented. No focal neurological deficits. Extremities: Symmetric 5 x 5 power. Skin: No rashes, lesions or ulcers Psychiatry: Judgement and insight appear normal. Mood & affect appropriate.    Follow-up Information    Follow up with [name], MD. Schedule an appointment as soon as possible for a visit in 3 days.   Specialty:  Internal Medicine   Why:  Hospital follow-up   Contact information:   [address] [place] [address] [phone]       Follow up with Follow-up with primary oncologist. Schedule an appointment as soon as possible for a visit in 3 days.   Why:  Please discuss CT findings and prognosis      Signed: Jhoan Schmieder 10/13/2015, 9:02 AM        Time  spent >45 mins

## 2015-10-14 LAB — CULTURE, BLOOD (ROUTINE X 2)
CULTURE: NO GROWTH
CULTURE: NO GROWTH

## 2015-10-30 ENCOUNTER — Ambulatory Visit (HOSPITAL_COMMUNITY)
Admission: RE | Admit: 2015-10-30 | Discharge: 2015-10-30 | Disposition: A | Discharge status: Home / Self Care | Payer: Medicare Other | Source: Ambulatory Visit | Attending: Pulmonary Disease | Admitting: Pulmonary Disease

## 2015-10-30 ENCOUNTER — Other Ambulatory Visit (HOSPITAL_COMMUNITY): Payer: Self-pay | Admitting: Pulmonary Disease

## 2015-10-30 DIAGNOSIS — J189 Pneumonia, unspecified organism: Secondary | ICD-10-CM

## 2015-10-30 DIAGNOSIS — R918 Other nonspecific abnormal finding of lung field: Secondary | ICD-10-CM | POA: Diagnosis not present

## 2015-11-02 ENCOUNTER — Emergency Department (HOSPITAL_COMMUNITY): Payer: Medicare Other

## 2015-11-02 ENCOUNTER — Encounter (HOSPITAL_COMMUNITY): Payer: Self-pay | Admitting: *Deleted

## 2015-11-02 ENCOUNTER — Inpatient Hospital Stay (HOSPITAL_COMMUNITY)
Admission: EM | Admit: 2015-11-02 | Discharge: 2015-11-05 | DRG: 178 | Disposition: A | Discharge status: Home Health | Payer: Medicare Other | Attending: Family Medicine | Admitting: Family Medicine

## 2015-11-02 DIAGNOSIS — C799 Secondary malignant neoplasm of unspecified site: Secondary | ICD-10-CM | POA: Diagnosis present

## 2015-11-02 DIAGNOSIS — C7931 Secondary malignant neoplasm of brain: Secondary | ICD-10-CM | POA: Diagnosis present

## 2015-11-02 DIAGNOSIS — Z9221 Personal history of antineoplastic chemotherapy: Secondary | ICD-10-CM

## 2015-11-02 DIAGNOSIS — R0602 Shortness of breath: Secondary | ICD-10-CM | POA: Diagnosis not present

## 2015-11-02 DIAGNOSIS — Z8249 Family history of ischemic heart disease and other diseases of the circulatory system: Secondary | ICD-10-CM

## 2015-11-02 DIAGNOSIS — C779 Secondary and unspecified malignant neoplasm of lymph node, unspecified: Secondary | ICD-10-CM | POA: Diagnosis present

## 2015-11-02 DIAGNOSIS — C73 Malignant neoplasm of thyroid gland: Secondary | ICD-10-CM | POA: Diagnosis present

## 2015-11-02 DIAGNOSIS — Z800 Family history of malignant neoplasm of digestive organs: Secondary | ICD-10-CM

## 2015-11-02 DIAGNOSIS — Z8701 Personal history of pneumonia (recurrent): Secondary | ICD-10-CM

## 2015-11-02 DIAGNOSIS — E119 Type 2 diabetes mellitus without complications: Secondary | ICD-10-CM | POA: Diagnosis present

## 2015-11-02 DIAGNOSIS — Z86010 Personal history of colonic polyps: Secondary | ICD-10-CM

## 2015-11-02 DIAGNOSIS — K219 Gastro-esophageal reflux disease without esophagitis: Secondary | ICD-10-CM | POA: Diagnosis present

## 2015-11-02 DIAGNOSIS — E039 Hypothyroidism, unspecified: Secondary | ICD-10-CM | POA: Diagnosis present

## 2015-11-02 DIAGNOSIS — Y95 Nosocomial condition: Secondary | ICD-10-CM

## 2015-11-02 DIAGNOSIS — Z90710 Acquired absence of both cervix and uterus: Secondary | ICD-10-CM

## 2015-11-02 DIAGNOSIS — Z833 Family history of diabetes mellitus: Secondary | ICD-10-CM

## 2015-11-02 DIAGNOSIS — Z825 Family history of asthma and other chronic lower respiratory diseases: Secondary | ICD-10-CM

## 2015-11-02 DIAGNOSIS — C7800 Secondary malignant neoplasm of unspecified lung: Secondary | ICD-10-CM | POA: Diagnosis present

## 2015-11-02 DIAGNOSIS — J441 Chronic obstructive pulmonary disease with (acute) exacerbation: Secondary | ICD-10-CM | POA: Diagnosis present

## 2015-11-02 DIAGNOSIS — Z880 Allergy status to penicillin: Secondary | ICD-10-CM

## 2015-11-02 DIAGNOSIS — M797 Fibromyalgia: Secondary | ICD-10-CM | POA: Diagnosis present

## 2015-11-02 DIAGNOSIS — E785 Hyperlipidemia, unspecified: Secondary | ICD-10-CM | POA: Diagnosis present

## 2015-11-02 DIAGNOSIS — Z86711 Personal history of pulmonary embolism: Secondary | ICD-10-CM

## 2015-11-02 DIAGNOSIS — Z841 Family history of disorders of kidney and ureter: Secondary | ICD-10-CM

## 2015-11-02 DIAGNOSIS — I10 Essential (primary) hypertension: Secondary | ICD-10-CM | POA: Diagnosis present

## 2015-11-02 DIAGNOSIS — J690 Pneumonitis due to inhalation of food and vomit: Secondary | ICD-10-CM | POA: Diagnosis not present

## 2015-11-02 DIAGNOSIS — Z66 Do not resuscitate: Secondary | ICD-10-CM | POA: Diagnosis present

## 2015-11-02 DIAGNOSIS — Z87891 Personal history of nicotine dependence: Secondary | ICD-10-CM

## 2015-11-02 DIAGNOSIS — Z923 Personal history of irradiation: Secondary | ICD-10-CM

## 2015-11-02 DIAGNOSIS — J189 Pneumonia, unspecified organism: Secondary | ICD-10-CM | POA: Diagnosis present

## 2015-11-02 DIAGNOSIS — Z820 Family history of epilepsy and other diseases of the nervous system: Secondary | ICD-10-CM

## 2015-11-02 DIAGNOSIS — J449 Chronic obstructive pulmonary disease, unspecified: Secondary | ICD-10-CM | POA: Diagnosis present

## 2015-11-02 DIAGNOSIS — R1319 Other dysphagia: Secondary | ICD-10-CM | POA: Diagnosis present

## 2015-11-02 LAB — CBC WITH DIFFERENTIAL/PLATELET
BASOS PCT: 0 %
Basophils Absolute: 0 10*3/uL (ref 0.0–0.1)
EOS ABS: 0 10*3/uL (ref 0.0–0.7)
EOS PCT: 0 %
HCT: 41.8 % (ref 36.0–46.0)
HEMOGLOBIN: 13.3 g/dL (ref 12.0–15.0)
Lymphocytes Relative: 5 %
Lymphs Abs: 0.6 10*3/uL — ABNORMAL LOW (ref 0.7–4.0)
MCH: 25.8 pg — ABNORMAL LOW (ref 26.0–34.0)
MCHC: 31.8 g/dL (ref 30.0–36.0)
MCV: 81 fL (ref 78.0–100.0)
MONOS PCT: 8 %
Monocytes Absolute: 0.8 10*3/uL (ref 0.1–1.0)
NEUTROS PCT: 87 %
Neutro Abs: 9.3 10*3/uL — ABNORMAL HIGH (ref 1.7–7.7)
PLATELETS: 244 10*3/uL (ref 150–400)
RBC: 5.16 MIL/uL — ABNORMAL HIGH (ref 3.87–5.11)
RDW: 20.7 % — AB (ref 11.5–15.5)
WBC: 10.7 10*3/uL — ABNORMAL HIGH (ref 4.0–10.5)

## 2015-11-02 MED ORDER — ALBUTEROL SULFATE (2.5 MG/3ML) 0.083% IN NEBU
5.0000 mg | INHALATION_SOLUTION | Freq: Once | RESPIRATORY_TRACT | Status: AC
Start: 2015-11-02 — End: 2015-11-02
  Administered 2015-11-02: 5 mg via RESPIRATORY_TRACT

## 2015-11-02 MED ORDER — VANCOMYCIN HCL IN DEXTROSE 1-5 GM/200ML-% IV SOLN
1000.0000 mg | Freq: Once | INTRAVENOUS | Status: AC
  Administered 2015-11-03: 1000 mg via INTRAVENOUS
  Filled 2015-11-02: qty 200

## 2015-11-02 MED ORDER — DEXTROSE 5 % IV SOLN
1.0000 g | Freq: Once | INTRAVENOUS | Status: AC
  Administered 2015-11-03: 1 g via INTRAVENOUS
  Filled 2015-11-02: qty 1

## 2015-11-02 MED ORDER — ALBUTEROL (5 MG/ML) CONTINUOUS INHALATION SOLN
10.0000 mg/h | INHALATION_SOLUTION | Freq: Once | RESPIRATORY_TRACT | Status: AC
  Administered 2015-11-02: 10 mg/h via RESPIRATORY_TRACT
  Filled 2015-11-02: qty 20

## 2015-11-02 MED ORDER — ALBUTEROL SULFATE (2.5 MG/3ML) 0.083% IN NEBU
INHALATION_SOLUTION | RESPIRATORY_TRACT | Status: AC
  Filled 2015-11-02: qty 6

## 2015-11-02 MED ORDER — METHYLPREDNISOLONE SODIUM SUCC 125 MG IJ SOLR
125.0000 mg | Freq: Once | INTRAMUSCULAR | Status: AC
  Administered 2015-11-03: 125 mg via INTRAVENOUS
  Filled 2015-11-02: qty 2

## 2015-11-02 MED ORDER — IPRATROPIUM BROMIDE 0.02 % IN SOLN
0.5000 mg | Freq: Once | RESPIRATORY_TRACT | Status: AC
  Administered 2015-11-02: 0.5 mg via RESPIRATORY_TRACT
  Filled 2015-11-02: qty 2.5

## 2015-11-02 NOTE — ED Triage Notes (Signed)
Pt c/o sob with cough that became worse today, has hx of thyroid cancer with mets to lymph nodes

## 2015-11-02 NOTE — ED Provider Notes (Addendum)
[facility] DEPT Provider Note   CSN: [id number] Arrival date & time: 11/02/15  2317   First MD Initiated Contact with Patient 11/02/15 2338     By signing my name below, I, Altamease Oiler, attest that this documentation has been prepared under the direction and in the presence of Rolland Porter, MD. Electronically Signed: Altamease Oiler, ED Scribe. 11/03/15.12:56 AM    History   Chief Complaint Chief Complaint  Patient presents with  . Shortness of Breath   Level 5 caveat b/o respiratory distress  HPI  The history is provided by the patient and a significant other. No language interpreter was used.   Haley Lowe is a 60 y.o. female with PMHx of COPD, emphysema, metastatic thyroid cancer, HLD, and PE who presents to the Emergency Department complaining of SOB with onset today near lunchtime per her husband. Pt was discharged from the hospital on 10/14/15 after being treated for pneumonia and she has been on antibiotics since per husband and was started on Flagyl for the last 2 days. Today her breathing worsened. Her home nebulizer treatments help briefly. She is currently on prednisone.  Pt denies fever.  She does not smoke and is not on home oxygen.   PCP Dr Gerarda Fraction Pulmonary Dr Luan Pulling  Patient is DO NOT RESUSCITATE   Past Medical History:  Diagnosis Date  . Anxiety   . Arthritis   . Borderline diabetes mellitus   . Cataract    1 lens implaqnt right eye,intact on left eye  . Controlled diabetes mellitus type II without complication ([place]) 79/05/4095   Previously diagnosed with borderline diabetes.  Marland Kitchen COPD (chronic obstructive pulmonary disease) ([place])   . Depression   . DVT (deep venous thrombosis) ([place]) 12/09/2011  . Emphysema   . Essential hypertension   . Fibromyalgia   . GERD (gastroesophageal reflux disease)   . Goiter   . HCAP (healthcare-associated pneumonia) 03/03/2015  . Headache(784.0)   . 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  History of radiation therapy 10/06/11   SRS 15Gy 81f, brain  . Hx of adenomatous colonic polyps   . Hyperlipidemia   . Lung nodule seen on imaging study 11/04/11 CT   427mLLL  . Metastatic adenocarcinoma to brain ([place]6/12/13   Left frontoparietal region  . Mucositis due to radiation therapy 03/03/2015  . PONV (postoperative nausea and vomiting)   . Pulmonary emboli ([facility] [place]8/1/13   Right upper lobe and r lower lobe PE  . Pulmonary embolism ([facility]9/08/2011  . S/P radiation therapy 05/31/14 SRS   Brain  . Thyroid cancer ([facility]2015  . UTI (lower urinary tract infection)     Patient Active Problem List   Diagnosis Date Noted  . HAP (hospital-acquired pneumonia) 11/03/2015  . Generalized weakness 10/09/2015  . Cough 10/09/2015  . Weakness 05/04/2015  . Near syncope 05/04/2015  . Hypomagnesemia 05/01/2015  . HCAP (healthcare-associated pneumonia)   . Palliative care encounter   . DNR (do not resuscitate) discussion   . Controlled diabetes mellitus type II without complication (HCRio1235/32/9924. Pressure ulcer 03/16/2015  . Antineoplastic chemotherapy induced pancytopenia ([facility]12/02/2015  . Hypotension 03/15/2015  . 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 Mucositis oral   . Esophagitis 03/09/2015  . Protein-calorie malnutrition, severe 03/07/2015  . Dysphagia   . Odynophagia   . Barrett's esophagus with dysplasia   . Esophageal reflux   . Metastatic cancer ([facility]  . Mucosal abnormality of esophagus   .  Aspiration pneumonia ([place]) 03/03/2015  . Mucositis due to radiation therapy 03/03/2015  . COPD (chronic obstructive pulmonary disease) ([place]) 03/03/2015  . Hyponatremia 03/03/2015  . Hypokalemia 03/03/2015  . Leukopenia 03/03/2015  . Thrombocytopenia ([place]) 03/03/2015  . Hypothyroid 03/03/2015  . Dehydration   . GERD (gastroesophageal reflux disease) 07/29/2014  . Chest tightness 07/28/2014  . Primary papillary thyroid carcinoma ([place]) 11/21/2013  . Occlusion and stenosis of  carotid artery without mention of cerebral infarction 07/18/2012  . DVT (deep venous thrombosis) ([place]) 12/09/2011  . Pulmonary embolism ([place]) 12/09/2011  . COPD with asthma ([place]) 10/18/2011  . Allergy   . Fibromyalgia   . Pulmonary nodules   . Primary cancer of thyroid with metastasis brain/ lung/ throat/ neck/ stomach [facility])     Past Surgical History:  Procedure Laterality Date  . ABDOMINAL HYSTERECTOMY  1996  . BIOPSY  03/07/2015   Procedure: BIOPSY;  Surgeon: Daneil Dolin, MD;  [facility];  Service: Endoscopy;;  . CATARACT EXTRACTION  2011   With lens implant  . CHOLECYSTECTOMY  2000  . CRANIOTOMY  09/15/2011   Procedure: CRANIOTOMY TUMOR EXCISION;  Surgeon: Hosie Spangle, MD;  Location: [facility]URO ORS;  Service: Neurosurgery;  Laterality: N/A;  Craniotomy resection of tumor with stealth  . ESOPHAGOGASTRODUODENOSCOPY (EGD) WITH PROPOFOL N/A 03/07/2015   Procedure: ESOPHAGOGASTRODUODENOSCOPY (EGD) WITH PROPOFOL;  Surgeon: Daneil Dolin, MD;  Location[facility];  Service: Endoscopy;  Laterality: N/A;  . EYE SURGERY  2001  . FOOT SURGERY Left 2005  . LYMPH GLAND EXCISION    . SHOULDER SURGERY Right 1998  . TOTAL THYROIDECTOMY  10-29-2013   [facility]  . VIDEO BRONCHOSCOPY WITH ENDOBRONCHIAL ULTRASOUND N/A 04/11/2014   Procedure: VIDEO BRONCHOSCOPY WITH ENDOBRONCHIAL ULTRASOUND;  Surgeon: Melrose Nakayama, MD;  Location: [facility];  Service: Thoracic;  Laterality: N/A;    OB History    No data available       Home Medications    Prior to Admission medications   Medication Sig Start Date End Date Taking? Authorizing Provider  acetaminophen (TYLENOL) 325 MG tablet Take 2 tablets (650 mg total) by mouth every 6 (six) hours as needed for mild pain. 05/10/15   Lavina Hamman, MD  albuterol (PROVENTIL HFA;VENTOLIN HFA) 108 (90 Base) MCG/ACT inhaler Inhale 2 puffs into the lungs every 6 (six) hours as needed for wheezing or shortness of  breath. 10/13/15   Reyne Dumas, MD  albuterol (PROVENTIL) (2.5 MG/3ML) 0.083% nebulizer solution Take 3 mLs (2.5 mg total) by nebulization every 4 (four) hours as needed for wheezing or shortness of breath. 04/25/15   Orvan Falconer, MD  chlorpheniramine-HYDROcodone [facility] PENNKINETIC ER) 10-8 MG/5ML SUER Take 5 mLs by mouth every 12 (twelve) hours as needed for cough. 10/13/15   Reyne Dumas, MD  levofloxacin (LEVAQUIN) 750 MG tablet Take 1 tablet (750 mg total) by mouth daily. 10/13/15   Reyne Dumas, MD  levothyroxine (SYNTHROID, LEVOTHROID) 112 MCG tablet Take 112 mcg by mouth daily before breakfast.    Historical Provider, MD  magic mouthwash w/lidocaine SOLN Take 5 mLs by mouth 4 (four) times daily as needed for mouth pain. 03/13/15   Velvet Bathe, MD  magnesium oxide (MAG-OX) 400 (241.3 Mg) MG tablet Take 1 tablet (400 mg total) by mouth 2 (two) times daily. 10/13/15   Reyne Dumas, MD  megestrol (MEGACE) 400 MG/10ML suspension Take 10 mLs (400 mg total) by mouth daily. 05/10/15   Josetta Huddle  Posey Pronto, MD  mometasone-formoterol [facility]) 200-5 MCG/ACT AERO Inhale 2 puffs into the lungs 2 (two) times daily. 10/13/15   Reyne Dumas, MD  ondansetron (ZOFRAN ODT) 8 MG disintegrating tablet Take 1 tablet (8 mg total) by mouth every 8 (eight) hours as needed for nausea or vomiting. 04/05/14   Pattricia Boss, MD  Oxycodone HCl 20 MG TABS Take 1 tablet by mouth. Every 1 to 2 hours.    Historical Provider, MD  pantoprazole (PROTONIX) 40 MG tablet Take 1 tablet (40 mg total) by mouth 2 (two) times daily. 03/13/15   Velvet Bathe, MD  polyethylene glycol (MIRALAX / GLYCOLAX) packet Take 17 g by mouth daily as needed for moderate constipation. Reported on 04/27/2015    Historical Provider, MD  Potassium Chloride 25 MEQ PACK Take 25 mEq by mouth 2 (two) times daily at 10 AM and 5 PM. 10/13/15   Reyne Dumas, MD  potassium chloride SA (K-DUR,KLOR-CON) 20 MEQ tablet Take 40 mEq by mouth 2 (two) times daily. Dissolve in water and take  either in applesauce or drink the water.    Historical Provider, MD  predniSONE (DELTASONE) 10 MG tablet 6 tablets for 3 days, 5 tablets for 3 days, 4 tablets for 3 days, 3 tablets for 3 days, 2 tablets for 3 days, 1 tablet for 3 days 10/13/15   Reyne Dumas, MD  sertraline (ZOLOFT) 50 MG tablet Take 50 mg by mouth daily.    Historical Provider, MD  Starch-Maltodextrin (THICK-IT PO) Take by mouth daily. Add to all liquid.    Historical Provider, MD    Family History Family History  Problem Relation Age of Onset  . Asthma Mother   . Kidney failure Father   . Diabetes Sister   . Heart attack Sister   . COPD Sister   . Colon cancer Brother   . Aneurysm Paternal Grandmother     Brain  . Parkinsonism Maternal Uncle   . COPD Brother     Social History Social History  Substance Use Topics  . Smoking status: Former Smoker    Packs/day: 1.00    Years: 40.00    Types: Cigarettes    Quit date: 04/06/2011  . Smokeless tobacco: Never Used  . Alcohol use No  lives at home Lives with spouse   Allergies   Penicillins; Other; Codeine; Potassium-containing compounds; and Tape   Review of Systems Review of Systems  Constitutional: Negative for fever.  Respiratory: Positive for shortness of breath.   All other systems reviewed and are negative.   Physical Exam Updated Vital Signs BP (!) 150/105   Pulse 95   Temp 97.8 F (36.6 C) (Oral)   Resp 26   Ht '5\' 3"'$  (1.6 m)   Wt 115 lb (52.2 kg)   SpO2 95%   BMI 20.37 kg/m   Vital signs normal except hypertension, tachypnea   Physical Exam  Constitutional: She is oriented to person, place, and time. She appears well-developed and well-nourished.  Non-toxic appearance. She appears ill. She appears distressed.  HENT:  Head: Normocephalic and atraumatic.  Right Ear: External ear normal.  Left Ear: External ear normal.  Nose: Nose normal. No mucosal edema or rhinorrhea.  Mouth/Throat: Oropharynx is clear and moist and mucous membranes  are normal. No dental abscesses or uvula swelling.  Eyes: Conjunctivae and EOM are normal. Pupils are equal, round, and reactive to light.  Neck: Normal range of motion and full passive range of motion without pain. Neck supple.  Cardiovascular: Normal  rate, regular rhythm and normal heart sounds.  Exam reveals no gallop and no friction rub.   No murmur heard. Pulmonary/Chest: Accessory muscle usage present. Tachypnea noted. She is in respiratory distress. She has no wheezes. She has no rhonchi. She has rales (diffuse rales, also audible ). She exhibits no tenderness and no crepitus.  Abdominal: Soft. Normal appearance and bowel sounds are normal. She exhibits no distension. There is no tenderness. There is no rebound and no guarding.  Musculoskeletal: Normal range of motion. She exhibits no edema or tenderness.  Moves all extremities well.   Neurological: She is alert and oriented to person, place, and time. She has normal strength. No cranial nerve deficit.  Skin: Skin is warm, dry and intact. No rash noted. No erythema. No pallor.  Psychiatric: She has a normal mood and affect. Her speech is normal and behavior is normal. Her mood appears not anxious.  Nursing note and vitals reviewed.    ED Treatments / Results  Labs (all labs ordered are listed, but only abnormal results are displayed) Results for orders placed or performed during the hospital encounter of 11/02/15  Comprehensive metabolic panel  Result Value Ref Range   Sodium 132 (L) 135 - 145 mmol/L   Potassium 4.2 3.5 - 5.1 mmol/L   Chloride 107 101 - 111 mmol/L   CO2 19 (L) 22 - 32 mmol/L   Glucose, Bld 106 (H) 65 - 99 mg/dL   BUN 12 6 - 20 mg/dL   Creatinine, Ser 0.57 0.44 - 1.00 mg/dL   Calcium 8.3 (L) 8.9 - 10.3 mg/dL   Total Protein 6.4 (L) 6.5 - 8.1 g/dL   Albumin 3.2 (L) 3.5 - 5.0 g/dL   AST 17 15 - 41 U/L   ALT 30 14 - 54 U/L   Alkaline Phosphatase 64 38 - 126 U/L   Total Bilirubin 0.5 0.3 - 1.2 mg/dL   GFR calc  non Af Amer >60 >60 mL/min   GFR calc Af Amer >60 >60 mL/min   Anion gap 6 5 - 15  CBC with Differential  Result Value Ref Range   WBC 10.7 (H) 4.0 - 10.5 K/uL   RBC 5.16 (H) 3.87 - 5.11 MIL/uL   Hemoglobin 13.3 12.0 - 15.0 g/dL   HCT 41.8 36.0 - 46.0 %   MCV 81.0 78.0 - 100.0 fL   MCH 25.8 (L) 26.0 - 34.0 pg   MCHC 31.8 30.0 - 36.0 g/dL   RDW 20.7 (H) 11.5 - 15.5 %   Platelets 244 150 - 400 K/uL   Neutrophils Relative % 87 %   Neutro Abs 9.3 (H) 1.7 - 7.7 K/uL   Lymphocytes Relative 5 %   Lymphs Abs 0.6 (L) 0.7 - 4.0 K/uL   Monocytes Relative 8 %   Monocytes Absolute 0.8 0.1 - 1.0 K/uL   Eosinophils Relative 0 %   Eosinophils Absolute 0.0 0.0 - 0.7 K/uL   Basophils Relative 0 %   Basophils Absolute 0.0 0.0 - 0.1 K/uL  Brain natriuretic peptide  Result Value Ref Range   B Natriuretic Peptide 71.0 0.0 - 100.0 pg/mL  Troponin I  Result Value Ref Range   Troponin I 0.03 (HH) <0.03 ng/mL    Laboratory interpretation all normal except Mild leukocytosis, minimally positive troponin, mild hyponatremia   EKG  EKG Interpretation  Date/Time:  Sunday November 02 2015 23:31:40 EDT Ventricular Rate:  92 PR Interval:    QRS Duration: 73 QT Interval:  337 QTC Calculation:  417 R Axis:   -47 Text Interpretation:  Sinus rhythm Baseline wander Electrode noise Anterolateral infarct, old No significant change since last tracing 09 Oct 2015 Confirmed by Kynnedi Zweig  MD-I, Jailine Lieder ([id number]) on 11/03/2015 12:04:16 AM       Radiology Dg Chest Port 1 View  Result Date: 11/03/2015 CLINICAL DATA:  60 year old female with shortness of breath and cough EXAM: PORTABLE CHEST 1 VIEW COMPARISON:  Chest radiograph dated 10/30/2015 FINDINGS: There is emphysematous changes of the lungs. Patchy right mid to lower lung field nodular density appear grossly stable compared to the prior study. Findings may be infection or inflammatory in etiology. Metastatic disease not excluded. There is mild central vascular prominence,  likely mild congestion. A nodular density noted at the left lung base likely represents a nipple shadow. There is no focal consolidation, pleural effusion, or pneumothorax. The cardiac silhouette is within normal limits. No acute osseous pathology identified. IMPRESSION: Patchy area of nodular density in the right mid to lower lung field with no significant interval change. Clinical correlation and follow-up recommended. Electronically Signed   By: Anner Crete M.D.   On: 11/03/2015 00:17    Dg Chest 2 View  Result Date: 10/30/2015 CLINICAL DATA:  History of pneumonia, followup EXAM: CHEST  2 VIEW COMPARISON:  CT chest of 10/09/2015 and chest x-ray of the same date FINDINGS. IMPRESSION: Apparent slight worsening of interstitial and patchy airspace disease in both mid lower lung feels. This may be inflammatory or infectious in nature, but the possibility of progressive lymphangitic spread of carcinoma as described on the prior CT of the chest cannot be excluded. Electronically Signed   By: Ivar Drape M.D.   On: 10/30/2015 10:27  Ct Angio Chest Pe W/cm &/or Wo Cm  Result Date: 10/09/2015 CLINICAL DATA:  Shortness of breath.  Thyroid cancer. s. IMPRESSION: 1. No pulmonary embolism. 2. New confluent subcarinal and bilateral hilar adenopathy, causing extrinsic narrowing of the bilateral central bronchi, worrisome for metastatic adenopathy. 3. Increased diffuse peribronchovascular interstitial thickening in both lungs, worrisome for lymphangitic tumor . 4. New mild patchy perilobular ground-glass opacities and small patchy nodular areas of consolidation in the bilateral peripheral lungs, probably inflammatory. 5. Stable small pericardial effusion/thickening . 6. Increased bilateral retrocrural and gastrohepatic ligament calcified adenopathy, most consistent with metastatic thyroid carcinoma adenopathy. 7. Healing subacute anterior right second through sixth rib fractures. 8. Moderate centrilobular emphysema.  9. Aortic atherosclerosis. Electronically Signed   By: Ilona Sorrel M.D.   On: 10/09/2015 17:39    Dg Chest Port 1 View  Result Date: 10/09/2015 CLINICAL DATA:  Cough. History of thyroid carcinoma. Recent pneumonia.  IMPRESSION: Stable fibrotic type change in the mid and lower lung zones, more pronounced on the right than on the left, stable. No new opacity. Stable cardiac silhouette. Aortic atherosclerosis noted. Electronically Signed   By: Lowella Grip III M.D.   On: 10/09/2015 12:58      Procedures Procedures (including critical care time)  Medications Ordered in ED Medications  albuterol (PROVENTIL) (2.5 MG/3ML) 0.083% nebulizer solution (  Not Given 11/02/15 2356)  aztreonam (AZACTAM) 1 g in dextrose 5 % 50 mL IVPB (not administered)  albuterol (PROVENTIL) (2.5 MG/3ML) 0.083% nebulizer solution 5 mg (5 mg Nebulization Given 11/02/15 2339)  methylPREDNISolone sodium succinate (SOLU-MEDROL) 125 mg/2 mL injection 125 mg (125 mg Intravenous Given 11/03/15 0000)  albuterol (PROVENTIL,VENTOLIN) solution continuous neb (10 mg/hr Nebulization Given 11/02/15 2352)  ipratropium (ATROVENT) nebulizer solution 0.5 mg (0.5 mg Nebulization Given 11/02/15  2352)  vancomycin (VANCOCIN) IVPB 1000 mg/200 mL premix (0 mg Intravenous Stopped 11/03/15 0111)     Initial Impression / Assessment and Plan / ED Course  I have reviewed the triage vital signs and the nursing notes.  Pertinent labs & imaging results that were available during my care of the patient were reviewed by me and considered in my medical decision making (see chart for details).  Clinical Course    COORDINATION OF CARE: 11:42 PM Discussed treatment plan which includes lab work, CXR, EKG, abx, steroids, and continuous nebulizer with pt at bedside and pt agreed to plan.  After reviewing patient's chest x-ray she was started on vancomycin and Azactam for her healthcare associated pneumonia.  12:28 AM I re-evaluated the patient and  provided an update on the results of her lab work and CXR. Pt is still having audible rales. We discussed admission, pt and family are agreeable.   12:54 AM-Consult complete with Dr. Darrick Meigs. Patient case explained and discussed. Agrees to admit patient to telemetry for further evaluation and treatment.     Final Clinical Impressions(s) / ED Diagnoses   Final diagnoses:  HAP (hospital-acquired pneumonia)   Plan admission  Rolland Porter, MD, FACEP  CRITICAL CARE Performed by: Marcelline Temkin L Andreya Lacks Total critical care time: 40 minutes Critical care time was exclusive of separately billable procedures and treating other patients. Critical care was necessary to treat or prevent imminent or life-threatening deterioration. Critical care was time spent personally by me on the following activities: development of treatment plan with patient and/or surrogate as well as nursing, discussions with consultants, evaluation of patient's response to treatment, examination of patient, obtaining history from patient or surrogate, ordering and performing treatments and interventions, ordering and review of laboratory studies, ordering and review of radiographic studies, pulse oximetry and re-evaluation of patient's condition.   I personally performed the services described in this documentation, which was scribed in my presence. The recorded information has been reviewed and considered.  Rolland Porter, MD, FACEPRolland Porter, MD 11/03/15 9643    Rolland Porter, MD 11/03/15 [phone]

## 2015-11-03 ENCOUNTER — Encounter (HOSPITAL_COMMUNITY): Payer: Self-pay | Admitting: *Deleted

## 2015-11-03 DIAGNOSIS — E039 Hypothyroidism, unspecified: Secondary | ICD-10-CM | POA: Diagnosis not present

## 2015-11-03 DIAGNOSIS — Z87891 Personal history of nicotine dependence: Secondary | ICD-10-CM | POA: Diagnosis not present

## 2015-11-03 DIAGNOSIS — J440 Chronic obstructive pulmonary disease with acute lower respiratory infection: Secondary | ICD-10-CM | POA: Diagnosis not present

## 2015-11-03 DIAGNOSIS — C779 Secondary and unspecified malignant neoplasm of lymph node, unspecified: Secondary | ICD-10-CM | POA: Diagnosis present

## 2015-11-03 DIAGNOSIS — I10 Essential (primary) hypertension: Secondary | ICD-10-CM | POA: Diagnosis present

## 2015-11-03 DIAGNOSIS — Y95 Nosocomial condition: Secondary | ICD-10-CM

## 2015-11-03 DIAGNOSIS — C7800 Secondary malignant neoplasm of unspecified lung: Secondary | ICD-10-CM | POA: Diagnosis present

## 2015-11-03 DIAGNOSIS — R1319 Other dysphagia: Secondary | ICD-10-CM | POA: Diagnosis present

## 2015-11-03 DIAGNOSIS — Z923 Personal history of irradiation: Secondary | ICD-10-CM | POA: Diagnosis not present

## 2015-11-03 DIAGNOSIS — J690 Pneumonitis due to inhalation of food and vomit: Principal | ICD-10-CM

## 2015-11-03 DIAGNOSIS — Z66 Do not resuscitate: Secondary | ICD-10-CM | POA: Diagnosis present

## 2015-11-03 DIAGNOSIS — E119 Type 2 diabetes mellitus without complications: Secondary | ICD-10-CM | POA: Diagnosis present

## 2015-11-03 DIAGNOSIS — K219 Gastro-esophageal reflux disease without esophagitis: Secondary | ICD-10-CM | POA: Diagnosis present

## 2015-11-03 DIAGNOSIS — C73 Malignant neoplasm of thyroid gland: Secondary | ICD-10-CM | POA: Diagnosis present

## 2015-11-03 DIAGNOSIS — Z8249 Family history of ischemic heart disease and other diseases of the circulatory system: Secondary | ICD-10-CM | POA: Diagnosis not present

## 2015-11-03 DIAGNOSIS — J189 Pneumonia, unspecified organism: Secondary | ICD-10-CM | POA: Diagnosis not present

## 2015-11-03 DIAGNOSIS — Z90710 Acquired absence of both cervix and uterus: Secondary | ICD-10-CM | POA: Diagnosis not present

## 2015-11-03 DIAGNOSIS — Z820 Family history of epilepsy and other diseases of the nervous system: Secondary | ICD-10-CM | POA: Diagnosis not present

## 2015-11-03 DIAGNOSIS — C7931 Secondary malignant neoplasm of brain: Secondary | ICD-10-CM | POA: Diagnosis present

## 2015-11-03 DIAGNOSIS — R0602 Shortness of breath: Secondary | ICD-10-CM | POA: Diagnosis present

## 2015-11-03 DIAGNOSIS — Z8701 Personal history of pneumonia (recurrent): Secondary | ICD-10-CM | POA: Diagnosis not present

## 2015-11-03 DIAGNOSIS — Z800 Family history of malignant neoplasm of digestive organs: Secondary | ICD-10-CM | POA: Diagnosis not present

## 2015-11-03 DIAGNOSIS — C801 Malignant (primary) neoplasm, unspecified: Secondary | ICD-10-CM | POA: Diagnosis not present

## 2015-11-03 DIAGNOSIS — M797 Fibromyalgia: Secondary | ICD-10-CM | POA: Diagnosis present

## 2015-11-03 DIAGNOSIS — Z833 Family history of diabetes mellitus: Secondary | ICD-10-CM | POA: Diagnosis not present

## 2015-11-03 DIAGNOSIS — Z825 Family history of asthma and other chronic lower respiratory diseases: Secondary | ICD-10-CM | POA: Diagnosis not present

## 2015-11-03 DIAGNOSIS — Z841 Family history of disorders of kidney and ureter: Secondary | ICD-10-CM | POA: Diagnosis not present

## 2015-11-03 DIAGNOSIS — E785 Hyperlipidemia, unspecified: Secondary | ICD-10-CM | POA: Diagnosis present

## 2015-11-03 DIAGNOSIS — J441 Chronic obstructive pulmonary disease with (acute) exacerbation: Secondary | ICD-10-CM | POA: Diagnosis present

## 2015-11-03 LAB — COMPREHENSIVE METABOLIC PANEL
ALBUMIN: 3.2 g/dL — AB (ref 3.5–5.0)
ALK PHOS: 64 U/L (ref 38–126)
ALT: 30 U/L (ref 14–54)
ANION GAP: 6 (ref 5–15)
AST: 17 U/L (ref 15–41)
BILIRUBIN TOTAL: 0.5 mg/dL (ref 0.3–1.2)
BUN: 12 mg/dL (ref 6–20)
CALCIUM: 8.3 mg/dL — AB (ref 8.9–10.3)
CO2: 19 mmol/L — ABNORMAL LOW (ref 22–32)
Chloride: 107 mmol/L (ref 101–111)
Creatinine, Ser: 0.57 mg/dL (ref 0.44–1.00)
GFR calc Af Amer: >60 mL/min (ref >60)
GLUCOSE: 106 mg/dL — AB (ref 65–99)
POTASSIUM: 4.2 mmol/L (ref 3.5–5.1)
Sodium: 132 mmol/L — ABNORMAL LOW (ref 135–145)
TOTAL PROTEIN: 6.4 g/dL — AB (ref 6.5–8.1)

## 2015-11-03 LAB — STREP PNEUMONIAE URINARY ANTIGEN: Strep Pneumo Urinary Antigen: NEGATIVE

## 2015-11-03 LAB — TROPONIN I: TROPONIN I: 0.03 ng/mL — AB (ref <0.03)

## 2015-11-03 LAB — LACTIC ACID, PLASMA
LACTIC ACID, VENOUS: 1.7 mmol/L (ref 0.5–1.9)
LACTIC ACID, VENOUS: 2.2 mmol/L — AB (ref 0.5–1.9)

## 2015-11-03 LAB — BRAIN NATRIURETIC PEPTIDE: B Natriuretic Peptide: 71 pg/mL (ref 0.0–100.0)

## 2015-11-03 MED ORDER — METHYLPREDNISOLONE SODIUM SUCC 125 MG IJ SOLR
60.0000 mg | Freq: Four times a day (QID) | INTRAMUSCULAR | Status: DC
  Administered 2015-11-03 – 2015-11-04 (×5): 60 mg via INTRAVENOUS
  Filled 2015-11-03 (×6): qty 2

## 2015-11-03 MED ORDER — DEXTROSE 5 % IV SOLN
2.0000 g | Freq: Three times a day (TID) | INTRAVENOUS | Status: DC
  Administered 2015-11-03: 2 g via INTRAVENOUS
  Filled 2015-11-03 (×2): qty 2

## 2015-11-03 MED ORDER — PANTOPRAZOLE SODIUM 40 MG PO TBEC
40.0000 mg | DELAYED_RELEASE_TABLET | Freq: Two times a day (BID) | ORAL | Status: DC
  Administered 2015-11-03 – 2015-11-05 (×6): 40 mg via ORAL
  Filled 2015-11-03 (×6): qty 1

## 2015-11-03 MED ORDER — ENOXAPARIN SODIUM 40 MG/0.4ML ~~LOC~~ SOLN
40.0000 mg | SUBCUTANEOUS | Status: DC
  Administered 2015-11-03 – 2015-11-05 (×3): 40 mg via SUBCUTANEOUS
  Filled 2015-11-03 (×3): qty 0.4

## 2015-11-03 MED ORDER — SERTRALINE HCL 50 MG PO TABS
50.0000 mg | ORAL_TABLET | Freq: Every day | ORAL | Status: DC
  Administered 2015-11-03 – 2015-11-05 (×3): 50 mg via ORAL
  Filled 2015-11-03 (×3): qty 1

## 2015-11-03 MED ORDER — LEVOTHYROXINE SODIUM 112 MCG PO TABS
112.0000 ug | ORAL_TABLET | Freq: Every day | ORAL | Status: DC
  Administered 2015-11-03 – 2015-11-05 (×3): 112 ug via ORAL
  Filled 2015-11-03 (×3): qty 1

## 2015-11-03 MED ORDER — ACETAMINOPHEN 325 MG PO TABS
650.0000 mg | ORAL_TABLET | Freq: Four times a day (QID) | ORAL | Status: DC | PRN
  Administered 2015-11-04 – 2015-11-05 (×2): 650 mg via ORAL
  Filled 2015-11-03 (×2): qty 2

## 2015-11-03 MED ORDER — MEGESTROL ACETATE 400 MG/10ML PO SUSP
400.0000 mg | Freq: Every day | ORAL | Status: DC
  Administered 2015-11-03 – 2015-11-05 (×3): 400 mg via ORAL
  Filled 2015-11-03 (×3): qty 10

## 2015-11-03 MED ORDER — MAGNESIUM OXIDE 400 (241.3 MG) MG PO TABS
400.0000 mg | ORAL_TABLET | Freq: Two times a day (BID) | ORAL | Status: DC
  Administered 2015-11-03 – 2015-11-05 (×6): 400 mg via ORAL
  Filled 2015-11-03 (×6): qty 1

## 2015-11-03 MED ORDER — ALBUTEROL SULFATE (2.5 MG/3ML) 0.083% IN NEBU: 2.5000 mg | INHALATION_SOLUTION | RESPIRATORY_TRACT | Status: DC | PRN

## 2015-11-03 MED ORDER — SODIUM CHLORIDE 0.9 % IV SOLN
250.0000 mg | Freq: Four times a day (QID) | INTRAVENOUS | Status: DC
  Administered 2015-11-03 – 2015-11-05 (×8): 250 mg via INTRAVENOUS
  Filled 2015-11-03 (×12): qty 250

## 2015-11-03 MED ORDER — DEXTROSE 5 % IV SOLN
INTRAVENOUS | Status: AC
  Filled 2015-11-03: qty 1

## 2015-11-03 MED ORDER — IPRATROPIUM-ALBUTEROL 0.5-2.5 (3) MG/3ML IN SOLN
3.0000 mL | Freq: Four times a day (QID) | RESPIRATORY_TRACT | Status: DC
  Administered 2015-11-03 – 2015-11-04 (×5): 3 mL via RESPIRATORY_TRACT
  Filled 2015-11-03 (×5): qty 3

## 2015-11-03 MED ORDER — POLYETHYLENE GLYCOL 3350 17 G PO PACK
17.0000 g | PACK | Freq: Every day | ORAL | Status: DC | PRN
  Administered 2015-11-04: 17 g via ORAL
  Filled 2015-11-03 (×2): qty 1

## 2015-11-03 MED ORDER — ALBUTEROL SULFATE (2.5 MG/3ML) 0.083% IN NEBU
2.5000 mg | INHALATION_SOLUTION | Freq: Four times a day (QID) | RESPIRATORY_TRACT | Status: DC
  Administered 2015-11-03 (×2): 2.5 mg via RESPIRATORY_TRACT
  Filled 2015-11-03 (×2): qty 3

## 2015-11-03 MED ORDER — GUAIFENESIN ER 600 MG PO TB12
1200.0000 mg | ORAL_TABLET | Freq: Two times a day (BID) | ORAL | Status: DC
  Administered 2015-11-03 – 2015-11-05 (×4): 1200 mg via ORAL
  Filled 2015-11-03 (×4): qty 2

## 2015-11-03 MED ORDER — DEXTROSE 5 % IV SOLN
1.0000 g | Freq: Three times a day (TID) | INTRAVENOUS | Status: DC
  Filled 2015-11-03 (×4): qty 1

## 2015-11-03 MED ORDER — DEXTROSE 5 % IV SOLN
INTRAVENOUS | Status: AC
  Filled 2015-11-03: qty 2

## 2015-11-03 MED ORDER — SODIUM CHLORIDE 0.9 % IV SOLN
INTRAVENOUS | Status: DC
  Administered 2015-11-03: 03:00:00 via INTRAVENOUS

## 2015-11-03 MED ORDER — MOMETASONE FURO-FORMOTEROL FUM 200-5 MCG/ACT IN AERO
2.0000 | INHALATION_SPRAY | Freq: Two times a day (BID) | RESPIRATORY_TRACT | Status: DC
  Administered 2015-11-03 – 2015-11-05 (×4): 2 via RESPIRATORY_TRACT
  Filled 2015-11-03: qty 8.8

## 2015-11-03 MED ORDER — VANCOMYCIN HCL IN DEXTROSE 750-5 MG/150ML-% IV SOLN
750.0000 mg | Freq: Two times a day (BID) | INTRAVENOUS | Status: DC
  Administered 2015-11-03 – 2015-11-05 (×3): 750 mg via INTRAVENOUS
  Filled 2015-11-03 (×7): qty 150

## 2015-11-03 MED ORDER — FUROSEMIDE 10 MG/ML IJ SOLN
20.0000 mg | Freq: Once | INTRAMUSCULAR | Status: AC
  Administered 2015-11-03: 20 mg via INTRAVENOUS
  Filled 2015-11-03: qty 2

## 2015-11-03 MED ORDER — ALBUTEROL SULFATE (2.5 MG/3ML) 0.083% IN NEBU: 5.0000 mg | INHALATION_SOLUTION | RESPIRATORY_TRACT | Status: AC | PRN

## 2015-11-03 NOTE — Progress Notes (Signed)
Pharmacy Antibiotic Note  Haley Lowe is a 60 y.o. female with small body habitus, admitted on 11/02/2015 with pneumonia.  Pharmacy has been consulted for VANCOMYCIN AND RENAL DOSE ADJUSTMENTS.  SCr at baseline.  Lactate elevated.   Plan:  Vancomycin '750mg'$  IV q12h, goal trough 15-20 Check trough at steady state Aztreonam 1gm IV q8h Monitor labs, renal fxn, progress and c/s Deescalate ABX when improved / appropriate.    Height: '5\' 3"'$  (160 cm) Weight: 115 lb (52.2 kg) IBW/kg (Calculated) : 52.4  Temp (24hrs), Avg:98.3 F (36.8 C), Min:97.8 F (36.6 C), Max:98.8 F (37.1 C)   Recent Labs Lab 11/02/15 2330 11/03/15 0032 11/03/15 0358  WBC 10.7*  --   --   CREATININE 0.57  --   --   LATICACIDVEN  --  1.7 2.2*    Estimated Creatinine Clearance: 62.4 mL/min (by C-G formula based on SCr of 0.8 mg/dL).    Allergies  Allergen Reactions  . Penicillins Swelling    " my brain swelled, and mymouth" Has patient had a PCN reaction causing immediate rash, facial/tongue/throat swelling, SOB or lightheadedness with hypotension: Yes Has patient had a PCN reaction causing severe rash involving mucus membranes or skin necrosis: No Has patient had a PCN reaction that required hospitalization No Has patient had a PCN reaction occurring within the last 10 years: No If all of the above answers are "NO", then may proceed with Cephalosporin use.   . Other Swelling    PT ALSO REACTS TO PAPER TAPE AND BAND-AIDS.  Marland Kitchen Codeine Itching    Mild takes benadryl  . Potassium-Containing Compounds Other (See Comments)    Potassium Liquid only- burns the mouth and throat, pt is able to take tablets dissolved.  . Tape Rash   Anti-infectives    Start     Dose/Rate Route Frequency Ordered Stop   11/03/15 1400  aztreonam (AZACTAM) 1 g in dextrose 5 % 50 mL IVPB     1 g 100 mL/hr over 30 Minutes Intravenous Every 8 hours 11/03/15 0756     11/03/15 1000  vancomycin (VANCOCIN) IVPB 750 mg/150 ml premix     750 mg 150 mL/hr over 60 Minutes Intravenous Every 12 hours 11/03/15 0758     11/03/15 0600  aztreonam (AZACTAM) 2 g in dextrose 5 % 50 mL IVPB  Status:  Discontinued     2 g 100 mL/hr over 30 Minutes Intravenous Every 8 hours 11/03/15 0229 11/03/15 0753   11/03/15 0000  vancomycin (VANCOCIN) IVPB 1000 mg/200 mL premix     1,000 mg 200 mL/hr over 60 Minutes Intravenous  Once 11/02/15 2347 11/03/15 0111   11/03/15 0000  aztreonam (AZACTAM) 1 g in dextrose 5 % 50 mL IVPB     1 g 100 mL/hr over 30 Minutes Intravenous  Once 11/02/15 2347 11/03/15 0159     Dose adjustments this admission: n/a  Recent Results (from the past 240 hour(s))  Culture, blood (routine x 2)     Status: None (Preliminary result)   Collection Time: 11/03/15 12:32 AM  Result Value Ref Range Status   Specimen Description BLOOD LEFT ARM  Final   Special Requests   Final    BOTTLES DRAWN AEROBIC AND ANAEROBIC AEB 8CC ANA [place]   Culture NO GROWTH < 12 HOURS  Final   Report Status PENDING  Incomplete  Culture, blood (routine x 2)     Status: None (Preliminary result)   Collection Time: 11/03/15 12:37 AM  Result Value Ref  Range Status   Specimen Description BLOOD RIGHT HAND  Final   Special Requests BOTTLES DRAWN AEROBIC ONLY 6CC  Final   Culture NO GROWTH < 12 HOURS  Final   Report Status PENDING  Incomplete   Thank you for allowing pharmacy to be a part of this patient's care.  Christian Robinsons, PharmD Clinical Pharmacist Pager:  [phone] 11/03/2015

## 2015-11-03 NOTE — H&P (Signed)
TRH H&P   Patient Demographics:    Haley Lowe, is a 60 y.o. female  MRN: [id number]  DOB - 02-13-1956  Admit Date - 11/02/2015  Outpatient Primary MD for the patient is [name], MD  Referring MD/NP/PA: Dr Tomi Bamberger  Patient coming from: home   Chief Complaint  Patient presents with  . Shortness of Breath      HPI:    Haley Lowe  is a 60 y.o. female, History of COPD, metastatic thyroid cancer who was just discharged from the hospital on 10/13/2015. In previous admission CT scan chest showed hilar lymphadenopathy causing extrinsic narrowing of bilateral central bronchi but his blood metastatic adenopathy, also lymphangitic tumor spread. Patient had refused palliative care consultation.  Patient is a poor historian, though denies chest pain, no nausea vomiting or diarrhea.   Review of systems:    In addition to the HPI above   A full 10 point Review of Systems was done, except as stated above, all other Review of Systems were negative.   With Past History of the following :    Past Medical History:  Diagnosis Date  . Anxiety   . Arthritis   . Borderline diabetes mellitus   . Cataract    1 lens implaqnt right eye,intact on left eye  . Controlled diabetes mellitus type II without complication ([place]) 22/97/9892   Previously diagnosed with borderline diabetes.  Marland Kitchen COPD (chronic obstructive pulmonary disease) ([place])   . Depression   . DVT (deep venous thrombosis) ([place]) 12/09/2011  . Emphysema   . Essential hypertension   . Fibromyalgia   . GERD (gastroesophageal reflux disease)   . Goiter   . HCAP (healthcare-associated pneumonia) 03/03/2015  . Headache(784.0)   . 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 History of radiation therapy 10/06/11   SRS 15Gy 69f, brain  . Hx of adenomatous colonic polyps   . Hyperlipidemia   . Lung nodule seen on imaging study  11/04/11 CT   433mLLL  . Metastatic adenocarcinoma to brain ([facility]/[place]13   Left frontoparietal region  . Mucositis due to radiation therapy 03/03/2015  . PONV (postoperative nausea and vomiting)   . Pulmonary emboli ([facility]13   Right upper lobe and r lower lobe PE  . Pulmonary embolism ([facility]9/08/2011  . S/P radiation therapy 05/31/14 SRS   Brain  . Thyroid cancer ([facility]  . UTI (lower urinary tract infection)       Past Surgical History:  Procedure Laterality Date  . ABDOMINAL HYSTERECTOMY  1996  . BIOPSY  03/07/2015   Procedure: BIOPSY;  Surgeon: RoDaneil DolinMD;  [facility];  Service: Endoscopy;;  . CATARACT EXTRACTION  2011   With lens implant  . CHOLECYSTECTOMY  2000  . CRANIOTOMY  09/15/2011   Procedure: CRANIOTOMY TUMOR EXCISION;  Surgeon: RoHosie SpangleMD;  [facility];  Service: Neurosurgery;  Laterality: N/A;  Craniotomy resection of tumor with stealth  . ESOPHAGOGASTRODUODENOSCOPY (EGD)  WITH PROPOFOL N/A 03/07/2015   Procedure: ESOPHAGOGASTRODUODENOSCOPY (EGD) WITH PROPOFOL;  Surgeon: Daneil Dolin, MD;  Location[facility];  Service: Endoscopy;  Laterality: N/A;  . EYE SURGERY  2001  . FOOT SURGERY Left 2005  . LYMPH GLAND EXCISION    . SHOULDER SURGERY Right 1998  . TOTAL THYROIDECTOMY  10-29-2013   [facility]  . VIDEO BRONCHOSCOPY WITH ENDOBRONCHIAL ULTRASOUND N/A 04/11/2014   Procedure: VIDEO BRONCHOSCOPY WITH ENDOBRONCHIAL ULTRASOUND;  Surgeon: Melrose Nakayama, MD;  Location: [facility];  Service: Thoracic;  Laterality: N/A;      Social History:     Social History  Substance Use Topics  . Smoking status: Former Smoker    Packs/day: 1.00    Years: 40.00    Types: Cigarettes    Quit date: 04/06/2011  . Smokeless tobacco: Never Used  . Alcohol use No        Family History :     Family History  Problem Relation Age of Onset  . Asthma Mother   . Kidney failure Father   . Diabetes Sister     . Heart attack Sister   . COPD Sister   . Colon cancer Brother   . Aneurysm Paternal Grandmother     Brain  . Parkinsonism Maternal Uncle   . COPD Brother       Home Medications:   Prior to Admission medications   Medication Sig Start Date End Date Taking? Authorizing Provider  acetaminophen (TYLENOL) 325 MG tablet Take 2 tablets (650 mg total) by mouth every 6 (six) hours as needed for mild pain. 05/10/15   Lavina Hamman, MD  albuterol (PROVENTIL HFA;VENTOLIN HFA) 108 (90 Base) MCG/ACT inhaler Inhale 2 puffs into the lungs every 6 (six) hours as needed for wheezing or shortness of breath. 10/13/15   Reyne Dumas, MD  albuterol (PROVENTIL) (2.5 MG/3ML) 0.083% nebulizer solution Take 3 mLs (2.5 mg total) by nebulization every 4 (four) hours as needed for wheezing or shortness of breath. 04/25/15   Orvan Falconer, MD  chlorpheniramine-HYDROcodone [facility] PENNKINETIC ER) 10-8 MG/5ML SUER Take 5 mLs by mouth every 12 (twelve) hours as needed for cough. 10/13/15   Reyne Dumas, MD  levofloxacin (LEVAQUIN) 750 MG tablet Take 1 tablet (750 mg total) by mouth daily. 10/13/15   Reyne Dumas, MD  levothyroxine (SYNTHROID, LEVOTHROID) 112 MCG tablet Take 112 mcg by mouth daily before breakfast.    Historical Provider, MD  magic mouthwash w/lidocaine SOLN Take 5 mLs by mouth 4 (four) times daily as needed for mouth pain. 03/13/15   Velvet Bathe, MD  magnesium oxide (MAG-OX) 400 (241.3 Mg) MG tablet Take 1 tablet (400 mg total) by mouth 2 (two) times daily. 10/13/15   Reyne Dumas, MD  megestrol (MEGACE) 400 MG/10ML suspension Take 10 mLs (400 mg total) by mouth daily. 05/10/15   Lavina Hamman, MD  mometasone-formoterol (DULERA) 200-5 MCG/ACT AERO Inhale 2 puffs into the lungs 2 (two) times daily. 10/13/15   Reyne Dumas, MD  ondansetron (ZOFRAN ODT) 8 MG disintegrating tablet Take 1 tablet (8 mg total) by mouth every 8 (eight) hours as needed for nausea or vomiting. 04/05/14   Pattricia Boss, MD  Oxycodone HCl 20 MG  TABS Take 1 tablet by mouth. Every 1 to 2 hours.    Historical Provider, MD  pantoprazole (PROTONIX) 40 MG tablet Take 1 tablet (40 mg total) by mouth 2 (two) times daily. 03/13/15   Velvet Bathe, MD  polyethylene  glycol (MIRALAX / GLYCOLAX) packet Take 17 g by mouth daily as needed for moderate constipation. Reported on 04/27/2015    Historical Provider, MD  Potassium Chloride 25 MEQ PACK Take 25 mEq by mouth 2 (two) times daily at 10 AM and 5 PM. 10/13/15   Reyne Dumas, MD  potassium chloride SA (K-DUR,KLOR-CON) 20 MEQ tablet Take 40 mEq by mouth 2 (two) times daily. Dissolve in water and take either in applesauce or drink the water.    Historical Provider, MD  predniSONE (DELTASONE) 10 MG tablet 6 tablets for 3 days, 5 tablets for 3 days, 4 tablets for 3 days, 3 tablets for 3 days, 2 tablets for 3 days, 1 tablet for 3 days 10/13/15   Reyne Dumas, MD  sertraline (ZOLOFT) 50 MG tablet Take 50 mg by mouth daily.    Historical Provider, MD  Starch-Maltodextrin (THICK-IT PO) Take by mouth daily. Add to all liquid.    Historical Provider, MD     Allergies:     Allergies  Allergen Reactions  . Penicillins Swelling    " my brain swelled, and mymouth" Has patient had a PCN reaction causing immediate rash, facial/tongue/throat swelling, SOB or lightheadedness with hypotension: Yes Has patient had a PCN reaction causing severe rash involving mucus membranes or skin necrosis: No Has patient had a PCN reaction that required hospitalization No Has patient had a PCN reaction occurring within the last 10 years: No If all of the above answers are "NO", then may proceed with Cephalosporin use.   . Other Swelling    PT ALSO REACTS TO PAPER TAPE AND BAND-AIDS.  Marland Kitchen Codeine Itching    Mild takes benadryl  . Potassium-Containing Compounds Other (See Comments)    Potassium Liquid only- burns the mouth and throat, pt is able to take tablets dissolved.  . Tape Rash     Physical Exam:   Vitals  Blood  pressure 131/99, pulse 101, temperature 97.8 F (36.6 C), temperature source Oral, resp. rate 16, height '5\' 3"'$  (1.6 m), weight 52.2 kg (115 lb), SpO2 91 %.   1. General frail appearing female * lying in bed in NAD, cooperative with exam  2. Normal affect and insight, Awake Alert, Oriented X 3.  3. No F.N deficits, ALL C.Nerves Intact, Strength 5/5 all 4 extremities, Sensation intact all 4 extremities, Plantars down going.  4. Ears and Eyes appear Normal, Conjunctivae clear, PERRLA. Moist Oral Mucosa.  5. Supple Neck, No JVD, No cervical lymphadenopathy appriciated, No Carotid Bruits.  6. Symmetrical Chest wall movement, Good air movement bilaterally, CTAB.  7. RRR, No Gallops, Rubs or Murmurs, No Parasternal Heave.No Leg edema  8. Positive Bowel Sounds, Abdomen Soft, No tenderness, No organomegaly appriciated,No rebound -guarding or rigidity.  9.  No Cyanosis, Normal Skin Turgor, No Skin Rash or Bruise.  10. Good muscle tone,  joints appear normal , no effusions, Normal ROM.      Data Review:    CBC  Recent Labs Lab 11/02/15 2330  WBC 10.7*  HGB 13.3  HCT 41.8  PLT 244  MCV 81.0  MCH 25.8*  MCHC 31.8  RDW 20.7*  LYMPHSABS 0.6*  MONOABS 0.8  EOSABS 0.0  BASOSABS 0.0   ------------------------------------------------------------------------------------------------------------------  Chemistries   Recent Labs Lab 11/02/15 2330  NA 132*  K 4.2  CL 107  CO2 19*  GLUCOSE 106*  BUN 12  CREATININE 0.57  CALCIUM 8.3*  AST 17  ALT 30  ALKPHOS 64  BILITOT 0.5   ------------------------------------------------------------------------------------------------------------------  ------------------------------------------------------------------------------------------------------------------  GFR: Estimated Creatinine Clearance: 62.4 mL/min (by C-G formula based on SCr of 0.8 mg/dL). Liver Function Tests:  Recent Labs Lab 11/02/15 2330  AST 17  ALT 30    ALKPHOS 64  BILITOT 0.5  PROT 6.4*  ALBUMIN 3.2*   Cardiac Enzymes:  Recent Labs Lab 11/02/15 2330  TROPONINI 0.03*    --------------------------------------------------------------------------------------------------------------- Urine analysis:    Component Value Date/Time   COLORURINE YELLOW 10/09/2015 1710   APPEARANCEUR CLEAR 10/09/2015 1710   LABSPEC 1.010 10/09/2015 1710   PHURINE 5.5 10/09/2015 1710   GLUCOSEU NEGATIVE 10/09/2015 1710   HGBUR NEGATIVE 10/09/2015 1710   BILIRUBINUR NEGATIVE 10/09/2015 1710   KETONESUR NEGATIVE 10/09/2015 1710   PROTEINUR NEGATIVE 10/09/2015 1710   UROBILINOGEN 0.2 07/28/2014 2004   NITRITE NEGATIVE 10/09/2015 1710   LEUKOCYTESUR SMALL (A) 10/09/2015 1710      ----------------------------------------------------------------------------------------------------------------   Imaging Results:    Dg Chest Port 1 View  Result Date: 11/03/2015 CLINICAL DATA:  60 year old female with shortness of breath and cough EXAM: PORTABLE CHEST 1 VIEW COMPARISON:  Chest radiograph dated 10/30/2015 FINDINGS: There is emphysematous changes of the lungs. Patchy right mid to lower lung field nodular density appear grossly stable compared to the prior study. Findings may be infection or inflammatory in etiology. Metastatic disease not excluded. There is mild central vascular prominence, likely mild congestion. A nodular density noted at the left lung base likely represents a nipple shadow. There is no focal consolidation, pleural effusion, or pneumothorax. The cardiac silhouette is within normal limits. No acute osseous pathology identified. IMPRESSION: Patchy area of nodular density in the right mid to lower lung field with no significant interval change. Clinical correlation and follow-up recommended. Electronically Signed   By: Anner Crete M.D.   On: 11/03/2015 00:17   My personal review of EKG: Rhythm NSR   Assessment & Plan:    Active  Problems:   HCAP (healthcare-associated pneumonia)   HAP (hospital-acquired pneumonia)   1. Healthcare associated pneumonia- patient started on vancomycin and is Azactam  per pharmacy. Follow blood cultures, urine strep pneumo antigen. Continue Solu-Medrol 60 mg IV every 6 hours 2. Metastatic thyroid cancer with metastases to lungs/brain stomach/- Patient off chemotherapy, followed by oncology at [facility]. We need to discuss prognosis with oncology and consider palliative care consultation.    DVT Prophylaxis-   Lovenox   AM Labs Ordered, also please review Full Orders  Family Communication: no family at bedside  Code Status:  DO NOT RESUSCITATE  Admission status: Observation  Time spent in minutes : 60 min   Earmon Sherrow S M.D on 11/03/2015 at 2:34 AM  Between 7am to 7pm - Pager - [phone]. After 7pm go to [email or web address] - password [facility]  [facility] - Office  [phone]

## 2015-11-03 NOTE — ED Notes (Signed)
Report given to Will on dept 300, all questions answered

## 2015-11-03 NOTE — ED Notes (Addendum)
Report given to Will on Dept 300 all questions answered

## 2015-11-03 NOTE — Care Management Obs Status (Signed)
[place] NOTIFICATION   Patient Details  Name: Haley Lowe MRN: [id number] Date of Birth: 01-27-1956   Medicare Observation Status Notification Given:  Yes    Keshia Weare, Chauncey Reading, RN 11/03/2015, 11:33 AM

## 2015-11-03 NOTE — Progress Notes (Signed)
Critical lab value: lactic acid 2.2 with read back. Dr. Darrick Meigs notified.

## 2015-11-03 NOTE — Care Management Note (Signed)
Case Management Note  Patient Details  Name: Haley Lowe MRN: [id number] Date of Birth: 31-Oct-1955  Subjective/Objective: Patient adm from home with HCAP. She is active with [facility], has aide, Therapist, sports, and OT services. Now requiring oxygen, had oxygen at home already. Has PCP and insurance, daughter reports no issues.   Action/Plan:Anticipate DC home with resumption of home health services.    Expected Discharge Date:  11/06/15               Expected Discharge Plan:  [place]  In-House Referral:  NA  Discharge planning Services  CM Consult  Post Acute Care Choice:  Home Health, Resumption of Svcs/PTA Provider Choice offered to:     DME Arranged:    DME Agency:     HH Arranged:    [place] Agency:     Status of Service:  Completed, signed off  If discussed at [employer] of Stay Meetings, dates discussed:    Additional Comments:  Shreyansh Tiffany, Chauncey Reading, RN 11/03/2015, 11:25 AM

## 2015-11-03 NOTE — Progress Notes (Signed)
Patient admitted to the hospital earlier this morning by Dr. Darrick Meigs.  Patient seen and examined.  She is sitting in bed appears to be short of breath. She has bilateral rhonchi. No edema b/l  She has been admitted with worsening shortness of breath and what appears to be aspiration pneumonia. 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 She has had multiple admissions in the past year. On the last admission, it appears that she was not willing to meet with palliative care. I have asked the patient to reconsider. They will think about it and let us know. Will continue IV antibiotics for now. Continue pulmonary hygiene. Will ask speech therapy to evaluate. She is also on treatment for copd exacerbation with nebs, steroids.  MEMON,JEHANZEB

## 2015-11-03 NOTE — ED Notes (Signed)
CRITICAL VALUE ALERT  Critical value received:  Trop 0.03  Date of notification:  11/03/2015  Time of notification:  0020  Critical value read back:Yes.    Nurse who received alert:  Iona Coach  MD notified (1st page):  Tomi Bamberger  Time of first page:  0025   Responding MD:  Tomi Bamberger  Time MD responded:  [phone]

## 2015-11-03 NOTE — Progress Notes (Signed)
Pharmacy Antibiotic Note  Haley Lowe is a 60 y.o. female with small body habitus, admitted on 11/02/2015 with pneumonia.  Pharmacy has been consulted for VANCOMYCIN, PRIMAXIN AND RENAL DOSE ADJUSTMENTS.  Aztreonam d/c'd in favor of Primaxin for suspected aspiration. SCr at baseline.  Lactate elevated.   Plan:  Vancomycin '750mg'$  IV q12h, goal trough 15-20 Check trough at steady state Primaxin '250mg'$  IV q6hrs Monitor labs, renal fxn, progress and c/s Deescalate ABX when improved / appropriate.    Height: '5\' 3"'$  (160 cm) Weight: 115 lb (52.2 kg) IBW/kg (Calculated) : 52.4  Temp (24hrs), Avg:98.3 F (36.8 C), Min:97.8 F (36.6 C), Max:98.8 F (37.1 C)   Recent Labs Lab 11/02/15 2330 11/03/15 0032 11/03/15 0358  WBC 10.7*  --   --   CREATININE 0.57  --   --   LATICACIDVEN  --  1.7 2.2*    Estimated Creatinine Clearance: 62.4 mL/min (by C-G formula based on SCr of 0.8 mg/dL).    Allergies  Allergen Reactions  . Penicillins Swelling    " my brain swelled, and mymouth" Has patient had a PCN reaction causing immediate rash, facial/tongue/throat swelling, SOB or lightheadedness with hypotension: Yes Has patient had a PCN reaction causing severe rash involving mucus membranes or skin necrosis: No Has patient had a PCN reaction that required hospitalization No Has patient had a PCN reaction occurring within the last 10 years: No If all of the above answers are "NO", then may proceed with Cephalosporin use.   . Other Swelling    PT ALSO REACTS TO PAPER TAPE AND BAND-AIDS.  Marland Kitchen Codeine Itching    Mild takes benadryl  . Potassium-Containing Compounds Other (See Comments)    Potassium Liquid only- burns the mouth and throat, pt is able to take tablets dissolved.  . Tape Rash   Anti-infectives    Start     Dose/Rate Route Frequency Ordered Stop   11/03/15 1800  imipenem-cilastatin (PRIMAXIN) 250 mg in sodium chloride 0.9 % 100 mL IVPB     250 mg 200 mL/hr over 30 Minutes Intravenous  Every 6 hours 11/03/15 1554     11/03/15 1400  aztreonam (AZACTAM) 1 g in dextrose 5 % 50 mL IVPB  Status:  Discontinued     1 g 100 mL/hr over 30 Minutes Intravenous Every 8 hours 11/03/15 0756 11/03/15 1549   11/03/15 1000  vancomycin (VANCOCIN) IVPB 750 mg/150 ml premix     750 mg 150 mL/hr over 60 Minutes Intravenous Every 12 hours 11/03/15 0758     11/03/15 0600  aztreonam (AZACTAM) 2 g in dextrose 5 % 50 mL IVPB  Status:  Discontinued     2 g 100 mL/hr over 30 Minutes Intravenous Every 8 hours 11/03/15 0229 11/03/15 0753   11/03/15 0000  vancomycin (VANCOCIN) IVPB 1000 mg/200 mL premix     1,000 mg 200 mL/hr over 60 Minutes Intravenous  Once 11/02/15 2347 11/03/15 0111   11/03/15 0000  aztreonam (AZACTAM) 1 g in dextrose 5 % 50 mL IVPB     1 g 100 mL/hr over 30 Minutes Intravenous  Once 11/02/15 2347 11/03/15 0159     Dose adjustments this admission: n/a  Recent Results (from the past 240 hour(s))  Culture, blood (routine x 2)     Status: None (Preliminary result)   Collection Time: 11/03/15 12:32 AM  Result Value Ref Range Status   Specimen Description BLOOD LEFT ARM  Final   Special Requests   Final  BOTTLES DRAWN AEROBIC AND ANAEROBIC AEB 8CC ANA [place]   Culture NO GROWTH < 12 HOURS  Final   Report Status PENDING  Incomplete  Culture, blood (routine x 2)     Status: None (Preliminary result)   Collection Time: 11/03/15 12:37 AM  Result Value Ref Range Status   Specimen Description BLOOD RIGHT HAND  Final   Special Requests BOTTLES DRAWN AEROBIC ONLY [place]  Final   Culture NO GROWTH < 12 HOURS  Final   Report Status PENDING  Incomplete   Thank you for allowing pharmacy to be a part of this patient's care.  Carroll Robinsons, PharmD Clinical Pharmacist Pager:  [phone] 11/03/2015

## 2015-11-03 NOTE — Progress Notes (Signed)
Patient is asleep still has gurgling sounds on 4 lpm / [place]. Rhonchi in chest. Appears comfortable.

## 2015-11-04 DIAGNOSIS — E039 Hypothyroidism, unspecified: Secondary | ICD-10-CM

## 2015-11-04 DIAGNOSIS — C801 Malignant (primary) neoplasm, unspecified: Secondary | ICD-10-CM

## 2015-11-04 LAB — CBC
HEMATOCRIT: 39.5 % (ref 36.0–46.0)
Hemoglobin: 12.6 g/dL (ref 12.0–15.0)
MCH: 26.1 pg (ref 26.0–34.0)
MCHC: 31.9 g/dL (ref 30.0–36.0)
MCV: 81.8 fL (ref 78.0–100.0)
Platelets: 258 10*3/uL (ref 150–400)
RBC: 4.83 MIL/uL (ref 3.87–5.11)
RDW: 21.2 % — AB (ref 11.5–15.5)
WBC: 11.5 10*3/uL — AB (ref 4.0–10.5)

## 2015-11-04 LAB — BASIC METABOLIC PANEL
ANION GAP: 7 (ref 5–15)
BUN: 15 mg/dL (ref 6–20)
CALCIUM: 8.8 mg/dL — AB (ref 8.9–10.3)
CO2: 24 mmol/L (ref 22–32)
Chloride: 107 mmol/L (ref 101–111)
Creatinine, Ser: 0.54 mg/dL (ref 0.44–1.00)
GFR calc Af Amer: >60 mL/min (ref >60)
GFR calc non Af Amer: >60 mL/min (ref >60)
GLUCOSE: 160 mg/dL — AB (ref 65–99)
Potassium: 3.9 mmol/L (ref 3.5–5.1)
Sodium: 138 mmol/L (ref 135–145)

## 2015-11-04 LAB — HIV ANTIBODY (ROUTINE TESTING W REFLEX): HIV Screen 4th Generation wRfx: NONREACTIVE

## 2015-11-04 MED ORDER — SENNOSIDES-DOCUSATE SODIUM 8.6-50 MG PO TABS
2.0000 | ORAL_TABLET | Freq: Every day | ORAL | Status: DC
  Administered 2015-11-05: 2 via ORAL
  Filled 2015-11-04: qty 2

## 2015-11-04 MED ORDER — OXYCODONE HCL 5 MG PO TABS
20.0000 mg | ORAL_TABLET | ORAL | Status: DC | PRN
Start: 2015-11-04 — End: 2015-11-05
  Administered 2015-11-04 – 2015-11-05 (×3): 20 mg via ORAL
  Filled 2015-11-04 (×3): qty 4

## 2015-11-04 MED ORDER — IPRATROPIUM-ALBUTEROL 0.5-2.5 (3) MG/3ML IN SOLN
3.0000 mL | Freq: Three times a day (TID) | RESPIRATORY_TRACT | Status: DC
  Administered 2015-11-05 (×2): 3 mL via RESPIRATORY_TRACT
  Filled 2015-11-04 (×2): qty 3

## 2015-11-04 MED ORDER — METHYLPREDNISOLONE SODIUM SUCC 125 MG IJ SOLR
60.0000 mg | Freq: Two times a day (BID) | INTRAMUSCULAR | Status: DC
  Administered 2015-11-05 (×2): 60 mg via INTRAVENOUS
  Filled 2015-11-04 (×2): qty 2

## 2015-11-04 NOTE — Evaluation (Addendum)
Clinical/Bedside Swallow Evaluation Patient Details  Name: Haley Lowe MRN: [id number] Date of Birth: 11/01/1955  Today's Date: 11/04/2015 Time: SLP Start Time (ACUTE ONLY): 9381 SLP Stop Time (ACUTE ONLY): 1242 SLP Time Calculation (min) (ACUTE ONLY): 27 min  Past Medical History:  Past Medical History:  Diagnosis Date  . Anxiety   . Arthritis   . Borderline diabetes mellitus   . Cataract    1 lens implaqnt right eye,intact on left eye  . Controlled diabetes mellitus type II without complication ([place]) 01/75/1025   Previously diagnosed with borderline diabetes.  Marland Kitchen COPD (chronic obstructive pulmonary disease) ([place])   . Depression   . DVT (deep venous thrombosis) ([place]) 12/09/2011  . Emphysema   . Essential hypertension   . Fibromyalgia   . GERD (gastroesophageal reflux disease)   . Goiter   . HCAP (healthcare-associated pneumonia) 03/03/2015  . Headache(784.0)   . 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 History of radiation therapy 10/06/11   SRS 15Gy 32f, brain  . Hx of adenomatous colonic polyps   . Hyperlipidemia   . Lung nodule seen on imaging study 11/04/11 CT   417mLLL  . Metastatic adenocarcinoma to brain ([facility]6/12/13   Left frontoparietal region  . Mucositis due to radiation therapy 03/03/2015  . PONV (postoperative nausea and vomiting)   . Pulmonary emboli ([facility]/1/13   Right upper lobe and r lower lobe PE  . Pulmonary embolism ([facility]/08/2011  . S/P radiation therapy 05/31/14 SRS   Brain  . Thyroid cancer ([facility]  . UTI (lower urinary tract infection)    Past Surgical History:  Past Surgical History:  Procedure Laterality Date  . ABDOMINAL HYSTERECTOMY  1996  . BIOPSY  03/07/2015   Procedure: BIOPSY;  Surgeon: RoDaneil DolinMD;  Location: [facility];  Service: Endoscopy;;  . CATARACT EXTRACTION  2011   With lens implant  . CHOLECYSTECTOMY  2000  . CRANIOTOMY  09/15/2011   Procedure: CRANIOTOMY TUMOR EXCISION;  Surgeon:  RoHosie SpangleMD;  [facility];  Service: Neurosurgery;  Laterality: N/A;  Craniotomy resection of tumor with stealth  . ESOPHAGOGASTRODUODENOSCOPY (EGD) WITH PROPOFOL N/A 03/07/2015   Procedure: ESOPHAGOGASTRODUODENOSCOPY (EGD) WITH PROPOFOL;  Surgeon: RoDaneil DolinMD;  Location: [facility];  Service: Endoscopy;  Laterality: N/A;  . EYE SURGERY  2001  . FOOT SURGERY Left 2005  . LYMPH GLAND EXCISION    . SHOULDER SURGERY Right 1998  . TOTAL [facility]  [facility]-29-2013   [facility] VIDEO BRONCHOSCOPY WITH ENDOBRONCHIAL ULTRASOUND N/A 04/11/2014   Procedure: VIDEO BRONCHOSCOPY WITH ENDOBRONCHIAL ULTRASOUND;  Surgeon: StMelrose NakayamaMD;  Location: [facility] OR;  Service: Thoracic;  Laterality: N/A;   HPI:  [age]of with hx of COPD, metastatic thyroid CA, presents with complaints of SOB after recently being discharged on 7/10. While in the ED, CXR showed patchy area of nodular density in the right mid to lower lung field with no significant interval change. Lactic acid was elevated. EKG showed sinus rhythm. She has dysphagia from prior radiation to head and neck. Pt was admitted for further management of aspiration PNA.   [facility]. Most recent note from 10/09/2015: Ms. HaSchermerhornresents today in terrible condition with aspect of PNA, significant weight loss and very poor PS.  She has caloric malnutrition.  She can hardly speak but does not appear disoriented.  I advised and insisted for hospital admission.   Haley Lowe and her husband though refused and would like to be admitted to [facility] close to home.  She has appt in several hours with her PCP, Haley Lowe, who admitted her before.   Advised Haley Lowe to d/c Lenvatinib again to not interfere in any way with her PNA, treatment and recovery.   Advised for swallowing eval as I am concerned that aspiration might be contributing to recurrence PNAs.   Radiological  Data: CT NECK WITH CONTRAST, May 01, 2014 09:16:29 AM .CONCLUSION: 1. Innumerable small lymph nodes along the jugular and posterior cervical chains, with the largest lymph nodes on the right at level 2A, many of which appear to be present on the CT dated 02/01/2014. Findings are suspicious for metastatic involvement in this patient with a prior neck dissection. 2. Postsurgical changes of a thyroidectomy with possible residual thyroid tissue along the right posterior lateral margin of the trachea.    CT OF THE CHEST ABDOMEN AND PELVIS WITH INTRAVENOUS CONTRAST, 08/15/2015  CONCLUSION: 1. Enlarging mediastinal, supraclavicular, bihilar, retrocrural, mesenteric, and retroperitoneal adenopathy is consistent with worsening metastatic disease. 2. New omental metastases. No evidence of ascites. 3. Improved centrilobular and tree-in-bud nodularity in the lower lobes, consistent with improving infection.  4. Age-indeterminate transcervical fracture of the left femoral neck with valgus configuration. Healing right anterior rib fractures. These have developed since the January 2017 exam and could be posttraumatic or metastatic in nature.   CT NECK W CONTRAST, 08/15/2015 CONCLUSION: 1. Anatomic distortion of the larynx is likely due to post treatment effects including surgical and radiation changes and may represent vocal cord paralysis. 2. New concerning areas of likely nodal metastatic disease including right supraclavicular/internal jugular nodes and left zone 5 lymph nodes. 3. A few nodular foci in and around the surgical bed as detailed above and marked in PACS may reflect local recurrence. 4. Question new sclerotic and expansile lesion of the second anterior rib on the right. 5. Cerebellar lesions noted on previous exams are not well seen today.  Pt was seen for FEES on 08/14/2015 with Haley Lowe, SLP at [facility]:  Impressions: Patient presents with moderate oropharyngeal dysphagia secondary to  extensive cancer history and various cancer therapies. Dysphagia is characterized by an overall discoordination of the swallow with prolonged oral phase, delayed initiation of pharyngeal swallow, and general inconsistency of swallow. There is also mildly decreased bolus propulsion and impaired airway closure. There apparent Left vocal fold immobility. Deficits result in intermittent deep penetration with thin liquids with laryngeal vestibule residue which would have been silently aspirated without a cued cough. This was unmitigated by strategies, but effectively managed by use of nectar liquids. There is mild pharyngeal residue with solids. Patient also required clinician cues to masticate and swallow a hard solid. Reviewed exam results with patient and husband who verbalized understanding. Discussed risks and adverse outcomes related to aspiration. Reviewed the following management options ranging from least to most conservative:  1) no intervention, thin liquids 2) swallowing therapy, thin liquids 3) nectar liquids +/- swallowing therapy  At this time patient elects to continue drinking nectar liquids and would like to pursue swallowing therapy closer to home at [facility]. Will request referral. Also offered patient management for her voice issues including referral to ENT for consideration of injection laryngoplasty and ordering information for voice amplifier. Patient not interested in seeing ENT at this time, but voice amplifier information was provided. Also encouraged patient to increase  intake of nutritional supplements and/or eat more frequently, though she should avoid eating when she is especially fatigued. Advised her to follow up with a dietician. Will plan to follow up in 3 months for repeat evaluation of swallowing function. Clinic contact information provided and patient will call if she wishes to pursue ENT consult.   Recommendations: Diet: soft-solid, nectar thick liquids Aspiration  Precautions: General Precautions: sit upright, slow rate, fully swallow bites/sips, only eat when awake/alert, assist prn, only feed when awake/alert, fully swallow before taking the next bite or sip, take small bites and sips, alternate liquids and solids/puree, cough/clear throat regularly during meal Administer Medications: one at a time Other recommendations: good oral care, consider ENT consult regarding- (vocal fold injection)   Assessment / Plan / Recommendation Clinical Impression  Please see above summary of FEES in May 2017. Mrs. Copus is known to this SLP from previous hospitalization in 2016. She has had several hospitalizations since then and was seen for instrumental assessements of swallow (MBS and FEES) within the last 6 months. Pt had FEES in May with recommendation (after discussion with pt/family) for soft foods and nectar-thick liquids due to inconsistency with safety with thin liquids and decreased movement of left vocal fold further compromising airway protection. Pt was minimally responsive to SLP questions regarding swallowing, food choices, and pt wishes. She presents with flat affect. Her husband supplied recent background information in that: pt was taking Lenvatinib chemo (pill by mouth daily), but was discontinued after her last visit with Dr. Melven Lowe in early July due to PNA. The cancer has shown a positive response to the chemo (per husband's report), but has terrible side effects leading to oral mucositis and dehydration. SLP asked whether she still gets enjoyment from eating and husband said that she did a few months ago... But has not in the past few months. Pt verbalized aloud that she did not want to go back to taking chemo. She sees Dr. Melven Lowe in mid September, has ENT appointment 11/06/15, and repeat FEES with Haley Lowe on 11/18/2015. Recommend continuing D2 with NTL (pt implements Frazier water protocol at home). Suspect that her swallowing becomes even more  compromised when she is feeling weak/side effects of chemo/mucositis which increases her risk for aspiration PNA. Certainly, a conversation with palliative care seems appropriate and/or conversation with her hemonchologist to help establish goals of care. SLP will follow during acute stay for diet tolerance. Pt does not appear to be a good candidate for dysphagia therapy given other comorbidities and lack of motivation from pt, however will attempt while in acute setting.    Aspiration Risk  Moderate aspiration risk    Diet Recommendation Dysphagia 2 (Fine chop);Nectar-thick liquid   Liquid Administration via: Cup Medication Administration: Whole meds with puree Supervision: Patient able to self feed;Staff to assist with self feeding;Full supervision/cueing for compensatory strategies Compensations: Slow rate Postural Changes: Seated upright at 90 degrees;Remain upright for at least 30 minutes after po intake    Other  Recommendations Oral Care Recommendations: Oral care BID;Staff/trained caregiver to provide oral care Other Recommendations: Order thickener from pharmacy;Prohibited food (jello, ice cream, thin soups);Clarify dietary restrictions   Follow up Recommendations  None    Frequency and Duration min 2x/week  1 week       Prognosis Prognosis for Safe Diet Advancement: Guarded Barriers to Reach Goals: Motivation      Swallow Study   General Date of Onset: 11/02/15 HPI: 70 yof with hx of COPD, metastatic thyroid CA,  presents with complaints of SOB after recently being discharged on 7/10. While in the ED, CXR showed patchy area of nodular density in the right mid to lower lung field with no significant interval change. Lactic acid was elevated. EKG showed sinus rhythm. She has dysphagia from prior radiation to head and neck. Pt was admitted for further management of aspiration PNA. Type of Study: Bedside Swallow Evaluation Previous Swallow Assessment: Pt has had numerous  objective tests; FEES in May 2017 with rec for D2/NTL Diet Prior to this Study: Dysphagia 2 (chopped);Nectar-thick liquids Temperature Spikes Noted: No Respiratory Status: Nasal cannula (however pt had removed upon SLP arrival) History of Recent Intubation: No Behavior/Cognition: Alert;Cooperative (flat affect) Oral Cavity Assessment: Dry;Within Functional Limits Oral Care Completed by SLP: Yes Vision: Functional for self-feeding Self-Feeding Abilities: Needs assist Patient Positioning: Upright in bed Baseline Vocal Quality: Breathy Volitional Cough: Weak;Congested Volitional Swallow: Able to elicit    Oral/Motor/Sensory Function Overall Oral Motor/Sensory Function: Mild impairment (weakness) Lingual Strength: Reduced Mandible: Within Functional Limits   Ice Chips Ice chips: Not tested   Thin Liquid Thin Liquid: Not tested    Nectar Thick Nectar Thick Liquid: Within functional limits Presentation: Cup;Self Fed   Honey Thick Honey Thick Liquid: Not tested   Puree Puree: Within functional limits Presentation: Self Fed   Solid   Thank you,  Genene Churn, CCC-SLP [phone]  Solid:  (pt ate a few bites of soft chopped)        Brittie Whisnant 11/04/2015,1:35 PM

## 2015-11-04 NOTE — Progress Notes (Signed)
PROGRESS NOTE    Haley Lowe  WNU:[id number] DOB: [birth year] DOA: 11/02/2015 PCP: [name], MD Outpatient Specialists:  Otolaryngology: Dorothe Pea, MD  Endocrinology: Cassandria Anger, MD    Brief Narrative:  60 yof with hx of COPD, metastatic thyroid CA, presents with complaints of SOB after recently being discharged on 7/10. While in the ED, CXR showed patchy area of nodular density in the right mid to lower lung field with no significant interval change. Lactic acid was elevated. EKG showed sinus rhythm. She has dysphagia from prior radiation to head and neck. Pt was admitted for further management of aspiration PNA.   Assessment & Plan:   Active Problems:   GERD (gastroesophageal reflux disease)   Aspiration pneumonia (HCC)   COPD (chronic obstructive pulmonary disease) (HCC)   Hypothyroid   Metastatic cancer ([place])   DNR (do not resuscitate)  1. Aspiration PNA. Patient has chronic dysphagia after radiation to head and neck for CA. Will have speech therapy evaluate the patient. Blood cultures have been negative 12 hours. She is currently on broad spectrum antibiotics with vancomycin and primaxin. Continue on pulmonary hygiene.  2. COPD exacerbation. Bilateral rhonchi on exam, no wheezing. Continue nebs. Will taper steroids  3. GERD. Continue PPI. 4. Hypothyroidism. Continue synthroid. 5. Metastatic cancer. Patient off chemotherapy. Followed by oncology at [facility]. Prognosis appears to be poor. She has had recurrent admissions for aspiration pneumonias due to dysphagia. She has been resistant to discuss hospice and palliative care. She is refusing to see palliative care during this hospitalization..  DVT prophylaxis: Lovenox Code Status: DNR Family Communication: discussed with patient Disposition Plan: discharge home once improved   Consultants:   none  Procedures:   none  Antimicrobials:  Vancomycin 7/30 >>   Primaxin  7/31>>   Subjective: Feeling better today  Objective: Vitals:   11/04/15 0252 11/04/15 0500 11/04/15 0754 11/04/15 0807  BP:  129/77    Pulse:  96    Resp:  20    Temp:  98.3 F (36.8 C)    TempSrc:  Oral    SpO2: 96% 95% 98% 100%  Weight:      Height:        Intake/Output Summary (Last 24 hours) at 11/04/15 0820 Last data filed at 11/03/15 1800  Gross per 24 hour  Intake              240 ml  Output                0 ml  Net              240 ml   Filed Weights   11/02/15 2328  Weight: 52.2 kg (115 lb)    Examination:  General exam: Appears calm and comfortable  Respiratory system: bilateral rhonchi. Respiratory effort normal. Cardiovascular system: S1 & S2 heard, RRR. No JVD, murmurs, rubs, gallops or clicks. No pedal edema. Gastrointestinal system: Abdomen is nondistended, soft and nontender. No organomegaly or masses felt. Normal bowel sounds heard. Central nervous system: Alert and oriented. No focal neurological deficits. Extremities: Symmetric 5 x 5 power. Skin: No rashes, lesions or ulcers Psychiatry: Judgement and insight appear normal. Mood & affect appropriate.     Data Reviewed: I have personally reviewed following labs and imaging studies  CBC:  Recent Labs Lab 11/02/15 2330 11/04/15 0500  WBC 10.7* 11.5*  NEUTROABS 9.3*  --   HGB 13.3 12.6  HCT 41.8 39.5  MCV 81.0 81.8  PLT 244 258  Basic Metabolic Panel:  Recent Labs Lab 11/02/15 2330 11/04/15 0500  NA 132* 138  K 4.2 3.9  CL 107 107  CO2 19* 24  GLUCOSE 106* 160*  BUN 12 15  CREATININE 0.57 0.54  CALCIUM 8.3* 8.8*   GFR: Estimated Creatinine Clearance: 62.4 mL/min (by C-G formula based on SCr of 0.8 mg/dL). Liver Function Tests:  Recent Labs Lab 11/02/15 2330  AST 17  ALT 30  ALKPHOS 64  BILITOT 0.5  PROT 6.4*  ALBUMIN 3.2*   Cardiac Enzymes:  Recent Labs Lab 11/02/15 2330  TROPONINI 0.03*   Urine analysis:    Component Value Date/Time   COLORURINE YELLOW  10/09/2015 1710   APPEARANCEUR CLEAR 10/09/2015 1710   LABSPEC 1.010 10/09/2015 1710   PHURINE 5.5 10/09/2015 1710   GLUCOSEU NEGATIVE 10/09/2015 1710   HGBUR NEGATIVE 10/09/2015 1710   BILIRUBINUR NEGATIVE 10/09/2015 1710   KETONESUR NEGATIVE 10/09/2015 1710   PROTEINUR NEGATIVE 10/09/2015 1710   UROBILINOGEN 0.2 07/28/2014 2004   NITRITE NEGATIVE 10/09/2015 1710   LEUKOCYTESUR SMALL (A) 10/09/2015 1710   Sepsis Labs: '@LABRCNTIP'$ (procalcitonin:4,lacticidven:4)  ) Recent Results (from the past 240 hour(s))  Culture, blood (routine x 2)     Status: None (Preliminary result)   Collection Time: 11/03/15 12:32 AM  Result Value Ref Range Status   Specimen Description BLOOD LEFT ARM  Final   Special Requests   Final    BOTTLES DRAWN AEROBIC AND ANAEROBIC AEB 8CC ANA [place]   Culture NO GROWTH < 12 HOURS  Final   Report Status PENDING  Incomplete  Culture, blood (routine x 2)     Status: None (Preliminary result)   Collection Time: 11/03/15 12:37 AM  Result Value Ref Range Status   Specimen Description BLOOD RIGHT HAND  Final   Special Requests BOTTLES DRAWN AEROBIC ONLY 6CC  Final   Culture NO GROWTH < 12 HOURS  Final   Report Status PENDING  Incomplete         Radiology Studies: Dg Chest Port 1 View  Result Date: 11/03/2015 CLINICAL DATA:  60 year old female with shortness of breath and cough EXAM: PORTABLE CHEST 1 VIEW COMPARISON:  Chest radiograph dated 10/30/2015 FINDINGS: There is emphysematous changes of the lungs. Patchy right mid to lower lung field nodular density appear grossly stable compared to the prior study. Findings may be infection or inflammatory in etiology. Metastatic disease not excluded. There is mild central vascular prominence, likely mild congestion. A nodular density noted at the left lung base likely represents a nipple shadow. There is no focal consolidation, pleural effusion, or pneumothorax. The cardiac silhouette is within normal limits. No acute osseous  pathology identified. IMPRESSION: Patchy area of nodular density in the right mid to lower lung field with no significant interval change. Clinical correlation and follow-up recommended. Electronically Signed   By: Anner Crete M.D.   On: 11/03/2015 00:17       Scheduled Meds: . enoxaparin (LOVENOX) injection  40 mg Subcutaneous Q24H  . guaiFENesin  1,200 mg Oral BID  . imipenem-cilastatin  250 mg Intravenous Q6H  . ipratropium-albuterol  3 mL Nebulization Q6H  . levothyroxine  112 mcg Oral QAC breakfast  . magnesium oxide  400 mg Oral BID  . megestrol  400 mg Oral Daily  . methylPREDNISolone (SOLU-MEDROL) injection  60 mg Intravenous Q6H  . mometasone-formoterol  2 puff Inhalation BID  . pantoprazole  40 mg Oral BID  . sertraline  50 mg Oral Daily  . vancomycin  750 mg Intravenous Q12H   Continuous Infusions: . sodium chloride 10 mL/hr at 11/03/15 0257     LOS: 1 day    Time spent: 25 minutes  Kathie Dike, MD [facility] Pager [phone]  If 7PM-7AM, please contact night-coverage [email or web address] Password [email or web address] 11/04/2015, 8:20 AM

## 2015-11-05 LAB — CBC
HEMATOCRIT: 34.9 % — AB (ref 36.0–46.0)
HEMOGLOBIN: 11.6 g/dL — AB (ref 12.0–15.0)
MCH: 27 pg (ref 26.0–34.0)
MCHC: 33.2 g/dL (ref 30.0–36.0)
MCV: 81.4 fL (ref 78.0–100.0)
Platelets: 264 10*3/uL (ref 150–400)
RBC: 4.29 MIL/uL (ref 3.87–5.11)
RDW: 21.4 % — ABNORMAL HIGH (ref 11.5–15.5)
WBC: 10.8 10*3/uL — AB (ref 4.0–10.5)

## 2015-11-05 LAB — BASIC METABOLIC PANEL
ANION GAP: 6 (ref 5–15)
BUN: 16 mg/dL (ref 6–20)
CHLORIDE: 108 mmol/L (ref 101–111)
CO2: 21 mmol/L — AB (ref 22–32)
Calcium: 8.3 mg/dL — ABNORMAL LOW (ref 8.9–10.3)
Creatinine, Ser: 0.49 mg/dL (ref 0.44–1.00)
GFR calc Af Amer: >60 mL/min (ref >60)
GFR calc non Af Amer: >60 mL/min (ref >60)
GLUCOSE: 119 mg/dL — AB (ref 65–99)
POTASSIUM: 3.9 mmol/L (ref 3.5–5.1)
Sodium: 135 mmol/L (ref 135–145)

## 2015-11-05 MED ORDER — CLINDAMYCIN HCL 150 MG PO CAPS: 300.0000 mg | ORAL_CAPSULE | Freq: Four times a day (QID) | ORAL | Status: DC

## 2015-11-05 MED ORDER — CLINDAMYCIN HCL 300 MG PO CAPS: 300.0000 mg | ORAL_CAPSULE | Freq: Four times a day (QID) | ORAL | 0 refills | Status: DC

## 2015-11-05 MED ORDER — PREDNISONE 20 MG PO TABS: ORAL_TABLET | ORAL | 0 refills | Status: DC

## 2015-11-05 MED ORDER — PREDNISONE 20 MG PO TABS: 40.0000 mg | ORAL_TABLET | Freq: Every day | ORAL | Status: DC

## 2015-11-05 NOTE — Care Management Important Message (Signed)
Important Message  Patient Details  Name: Haley Lowe MRN: [id number] Date of Birth: 12-01-1955   Medicare Important Message Given:  Yes    Aiyanah Kalama, Chauncey Reading, RN 11/05/2015, 12:02 PM

## 2015-11-05 NOTE — Progress Notes (Signed)
PROGRESS NOTE  Haley Lowe PJK:[id number] DOB: 1955/04/11 DOA: 11/02/2015 PCP: Haley Herring., MD  Brief Narrative: 60 year old woman with a hx of COPD, metastatic thyroid CA, presented with complaints of SOB after being recently discharged on 7/10. During evaluation in the ED, CXR showed nodular density in the right lung. She was started on IV antibiotics and admitted for further management of aspiration PNA. She appears clinically improved as her vital signs appear stable, O2 sats have been adequate, and bloodwork does not indicate any ongoing infection. She is allergic to penicillins, so she will be discharged home with a short course of Clindamycin to complete on 8/5. It has been recommended that she follow up with her PCP in 1-2 weeks.  Assessment/Plan: 1. Aspiration pneumonia. Appears clinically resolved. No hypoxia. Leukocytosis resolving. Chronic dysphagia status post head and neck radiation for thyroid cancer. Speech pathology recommended a dysphagia 2 diet and thickened liquids. 2. COPD exacerbation, appears resolved. 3. Hypothyroidism. Continue Synthroid. 4. Metastatic thyroid cancer. Patient off chemotherapy. Followed by [facility] oncology. Prognosis is poor. Refused palliative medicine consultation.   Overall improving. Blood pressures stable, no hypoxia. Afebrile. Will discharge home with Clindamycin and steroid taper.  Given allergies, selected clindamycin. Discussed risk/benefit with patient and husband in particular regard to C. difficile. They accept.  Haley Hodgkins, MD  [facility] Direct contact: [phone] --Via amion app OR  --[email or web address]; password [email or web address]  7PM-7AM contact night coverage as above 11/05/2015, 7:44 AM  LOS: 2 days   Consultants:   none  Procedures:   none  Antimicrobials:  Vancomycin 7/30 >> 8/2  Primaxin 7/31>>8/2  Clindamycin 8/2 >>8/05  HPI/Subjective: Feels fine. No new complaints. Reports that breathing has returned to  normal. Endorses appetite.   Objective: Vitals:   11/04/15 1504 11/04/15 1955 11/04/15 2002 11/04/15 2118  BP:    115/77  Pulse:    (!) 102  Resp:    20  Temp:    98.5 F (36.9 C)  TempSrc:    Oral  SpO2: 93% 92% 92% 93%  Weight:      Height:        Intake/Output Summary (Last 24 hours) at 11/05/15 0744 Last data filed at 11/04/15 2200  Gross per 24 hour  Intake              720 ml  Output                0 ml  Net              720 ml     Filed Weights   11/02/15 2328  Weight: 52.2 kg (115 lb)    Exam:  Constitutional:  . Appears calm and comfortable Eyes:  . PERRL and irises appear normal . Conjunctivae and lids appear normal ENMT:  . grossly normal hearing  . Lips and tongue normal Respiratory:  . CTA bilaterally, no w/r/r.  . Respiratory effort normal. No retractions or accessory muscle use Cardiovascular:  . RRR, no m/r/g . No LE extremity edema   Abdomen:  . no tenderness or masses. Non-distended  Musculoskeletal:  o Moves all extremities. Psychiatric:  . judgement and insight appear normal . Mental status o Mood, affect appropriate   I have personally reviewed following labs and imaging studies:  BUN and Cr wnl.  WBC 10.8, trending down  Hgb 11.6  Glucose 119, stable  BP stable  O2 sats adequate  Afebrile  Scheduled Meds: . enoxaparin (LOVENOX) injection  40 mg Subcutaneous  Q24H  . guaiFENesin  1,200 mg Oral BID  . imipenem-cilastatin  250 mg Intravenous Q6H  . ipratropium-albuterol  3 mL Nebulization TID  . levothyroxine  112 mcg Oral QAC breakfast  . magnesium oxide  400 mg Oral BID  . megestrol  400 mg Oral Daily  . methylPREDNISolone (SOLU-MEDROL) injection  60 mg Intravenous Q12H  . mometasone-formoterol  2 puff Inhalation BID  . pantoprazole  40 mg Oral BID  . senna-docusate  2 tablet Oral QHS  . sertraline  50 mg Oral Daily  . vancomycin  750 mg Intravenous Q12H   Continuous Infusions: . sodium chloride 10 mL/hr at  11/03/15 8250    Active Problems:   GERD (gastroesophageal reflux disease)   Aspiration pneumonia (HCC)   COPD (chronic obstructive pulmonary disease) (HCC)   Hypothyroid   Metastatic cancer (HCC)   DNR (do not resuscitate)   LOS: 2 days      By signing my name below, I, Haley Lowe, attest that this documentation has been prepared under the direction and in the presence of [place]. Haley Jews, MD. Electronically Signed: Delene Lowe, Scribe.  11/05/15 1:10am    I personally performed the services described in this documentation. All medical record entries made by the scribe were at my direction. I have reviewed the chart and agree that the record reflects my personal performance and is accurate and complete. Haley Hodgkins, MD

## 2015-11-05 NOTE — Discharge Summary (Signed)
Physician Discharge Summary  Haley Lowe XKG:[id number] DOB: March 06, 1956 DOA: 11/02/2015  PCP: [name], MD  Admit date: 11/02/2015 Discharge date: 11/05/2015  Recommendations for Outpatient Follow-up:  1. Follow up Resolution of aspiration pneumonia. 2. Consider palliative medicine consultation as the patient is agreeable.  Follow-up Information    [name], MD. Schedule an appointment as soon as possible for a visit in 1 week(s).   Specialty:  Internal Medicine Contact information: [address] [place] [address] [phone]          Discharge Diagnoses:  1. Aspiration Pneumonia 2. COPD exacerbation 3. GERD 4. Hypothyroidism 5. Metastatic thyroid cancer  Discharge Condition: Improved Disposition: Discharge home  Diet recommendation: dysphagia 2, nectar thick liquids  Filed Weights   11/02/15 2328  Weight: 52.2 kg (115 lb)    History of present illness:  60 year old woman with COPD, metastatic thyroid cancer presented with shortness of breath, admitted for aspiration pneumonia.  Hospital Course:  Treated with broad-spectrum antibiotics, seen by speech therapy. Rapidly improved with resolution of hypoxia and transition to oral antibiotics. Seen by speech therapy with recommendations for dysphagia 2 diet with nectar thick liquids. Refused palliative care consultation. She is allergic to penicillins, so she will be discharged home with a short course of Clindamycin to complete on 8/5.  1. Aspiration pneumonia. Appears clinically resolved. No hypoxia. Leukocytosis resolving. Chronic dysphagia status post head and neck radiation for thyroid cancer. Speech pathology recommended a dysphagia 2 diet and thickened liquids. 2. COPD exacerbation, appears resolved. 3. Hypothyroidism. Continue Synthroid. 4. Metastatic thyroid cancer. Patient off chemotherapy. Followed by [facility] oncology. Prognosis is poor. Refused palliative medicine  consultation.  Consultants:  none  Procedures:  none  Antimicrobials:  Vancomycin 7/30 >>8/2  Primaxin 7/31>>8/2  Clindamycin 8/2 >>8/05 Discharge Instructions  Discharge Instructions    Discharge instructions    Complete by:  As directed   Call your physician or seek immediate medical attention for fever, shortness of breath, wheezing, difficulty swallowing, cough, diarrhea, abdominal pain or worsening of condition.   Recommend dysphagia 2 diet (Fine chop);Nectar-thick liquids Liquid Administration via: Cup Medication Administration: Whole meds with puree   Increase activity slowly    Complete by:  As directed       Medication List    STOP taking these medications   levofloxacin 750 MG tablet Commonly known as:  LEVAQUIN   metroNIDAZOLE 500 MG tablet Commonly known as:  FLAGYL   mometasone-formoterol 200-5 MCG/ACT Aero Commonly known as:  DULERA   Potassium Chloride 25 MEQ Pack     TAKE these medications   acetaminophen 325 MG tablet Commonly known as:  TYLENOL Take 2 tablets (650 mg total) by mouth every 6 (six) hours as needed for mild pain.   albuterol (2.5 MG/3ML) 0.083% nebulizer solution Commonly known as:  PROVENTIL Take 3 mLs (2.5 mg total) by nebulization every 4 (four) hours as needed for wheezing or shortness of breath.   albuterol 108 (90 Base) MCG/ACT inhaler Commonly known as:  PROVENTIL HFA;VENTOLIN HFA Inhale 2 puffs into the lungs every 6 (six) hours as needed for wheezing or shortness of breath.   budesonide-formoterol 160-4.5 MCG/ACT inhaler Commonly known as:  SYMBICORT Inhale 2 puffs into the lungs 2 (two) times daily.   chlorpheniramine-HYDROcodone 10-8 MG/5ML Suer Commonly known as:  TUSSIONEX PENNKINETIC ER Take 5 mLs by mouth every 12 (twelve) hours as needed for cough.   clindamycin 300 MG capsule Commonly known as:  CLEOCIN Take 1 capsule (300 mg total)  by mouth 4 (four) times daily.   levothyroxine 112 MCG  tablet Commonly known as:  SYNTHROID, LEVOTHROID Take 112 mcg by mouth daily before breakfast.   magic mouthwash w/lidocaine Soln Take 5 mLs by mouth 4 (four) times daily as needed for mouth pain.   magnesium oxide 400 (241.3 Mg) MG tablet Commonly known as:  MAG-OX Take 1 tablet (400 mg total) by mouth 2 (two) times daily.   megestrol 400 MG/10ML suspension Commonly known as:  MEGACE Take 10 mLs (400 mg total) by mouth daily.   ondansetron 8 MG disintegrating tablet Commonly known as:  ZOFRAN ODT Take 1 tablet (8 mg total) by mouth every 8 (eight) hours as needed for nausea or vomiting.   Oxycodone HCl 20 MG Tabs Take 1 tablet by mouth every 3 (three) hours as needed (pain).   pantoprazole 40 MG tablet Commonly known as:  PROTONIX Take 1 tablet (40 mg total) by mouth 2 (two) times daily.   polyethylene glycol packet Commonly known as:  MIRALAX / GLYCOLAX Take 17 g by mouth daily as needed for moderate constipation. Reported on 04/27/2015   potassium chloride SA 20 MEQ tablet Commonly known as:  K-DUR,KLOR-CON Take 40 mEq by mouth 2 (two) times daily. Dissolve in water and take either in applesauce or drink the water.   predniSONE 20 MG tablet Commonly known as:  DELTASONE Take daily by mouth: 40 mg x3 days, then 20 mg x3 days, then 10 mg x3 days, then stop. What changed:  medication strength  additional instructions   sertraline 50 MG tablet Commonly known as:  ZOLOFT Take 50 mg by mouth daily.   THICK-IT PO Take by mouth daily. Add to all liquid.   tiotropium 18 MCG inhalation capsule Commonly known as:  SPIRIVA Place 18 mcg into inhaler and inhale daily.      Allergies  Allergen Reactions  . Penicillins Swelling    " my brain swelled, and mymouth" Has patient had a PCN reaction causing immediate rash, facial/tongue/throat swelling, SOB or lightheadedness with hypotension: Yes Has patient had a PCN reaction causing severe rash involving mucus membranes or  skin necrosis: No Has patient had a PCN reaction that required hospitalization No Has patient had a PCN reaction occurring within the last 10 years: No If all of the above answers are "NO", then may proceed with Cephalosporin use.   . Other Swelling    PT ALSO REACTS TO PAPER TAPE AND BAND-AIDS.  Marland Kitchen Codeine Itching    Mild takes benadryl  . Potassium-Containing Compounds Other (See Comments)    Potassium Liquid only- burns the mouth and throat, pt is able to take tablets dissolved.  . Tape Rash    The results of significant diagnostics from this hospitalization (including imaging, microbiology, ancillary and laboratory) are listed below for reference.    Significant Diagnostic Studies: Dg Chest Port 1 View  Result Date: 11/03/2015 CLINICAL DATA:  60 year old female with shortness of breath and cough EXAM: PORTABLE CHEST 1 VIEW COMPARISON:  Chest radiograph dated 10/30/2015 FINDINGS: There is emphysematous changes of the lungs. Patchy right mid to lower lung field nodular density appear grossly stable compared to the prior study. Findings may be infection or inflammatory in etiology. Metastatic disease not excluded. There is mild central vascular prominence, likely mild congestion. A nodular density noted at the left lung base likely represents a nipple shadow. There is no focal consolidation, pleural effusion, or pneumothorax. The cardiac silhouette is within normal limits. No acute osseous  pathology identified. IMPRESSION: Patchy area of nodular density in the right mid to lower lung field with no significant interval change. Clinical correlation and follow-up recommended. Electronically Signed   By: Anner Crete M.D.   On: 11/03/2015 00:17  Microbiology: Recent Results (from the past 240 hour(s))  Culture, blood (routine x 2)     Status: None (Preliminary result)   Collection Time: 11/03/15 12:32 AM  Result Value Ref Range Status   Specimen Description BLOOD LEFT ARM  Final   Special  Requests   Final    BOTTLES DRAWN AEROBIC AND ANAEROBIC AEB=8CC ANA=6CC   Culture NO GROWTH 2 DAYS  Final   Report Status PENDING  Incomplete  Culture, blood (routine x 2)     Status: None (Preliminary result)   Collection Time: 11/03/15 12:37 AM  Result Value Ref Range Status   Specimen Description BLOOD RIGHT HAND  Final   Special Requests BOTTLES DRAWN AEROBIC ONLY 6CC  Final   Culture NO GROWTH 2 DAYS  Final   Report Status PENDING  Incomplete     Labs: Basic Metabolic Panel:  Recent Labs Lab 11/02/15 2330 11/04/15 0500 11/05/15 0523  NA 132* 138 135  K 4.2 3.9 3.9  CL 107 107 108  CO2 19* 24 21*  GLUCOSE 106* 160* 119*  BUN '12 15 16  '$ CREATININE 0.57 0.54 0.49  CALCIUM 8.3* 8.8* 8.3*   Liver Function Tests:  Recent Labs Lab 11/02/15 2330  AST 17  ALT 30  ALKPHOS 64  BILITOT 0.5  PROT 6.4*  ALBUMIN 3.2*   CBC:  Recent Labs Lab 11/02/15 2330 11/04/15 0500 11/05/15 0523  WBC 10.7* 11.5* 10.8*  NEUTROABS 9.3*  --   --   HGB 13.3 12.6 11.6*  HCT 41.8 39.5 34.9*  MCV 81.0 81.8 81.4  PLT 244 258 264   Cardiac Enzymes:  Recent Labs Lab 11/02/15 2330  TROPONINI 0.03*   BNP:   Recent Labs  04/18/15 2031 10/09/15 1302 11/02/15 2330  BNP 42.0 31.0 71.0     Active Problems:   GERD (gastroesophageal reflux disease)   Aspiration pneumonia (HCC)   COPD (chronic obstructive pulmonary disease) (HCC)   Hypothyroid   Metastatic cancer (HCC)   DNR (do not resuscitate)   Time coordinating discharge: 35 minutes  Signed:  [name], MD [facility] 11/05/2015, 6:35 PM   By signing my name below, I, [name], attest that this documentation has been prepared under the direction and in the presence of Marque Bango P. Sarajane Jews, MD. Electronically Signed: [name], Scribe.  11/05/15 1:10pm  I personally performed the services described in this documentation. All medical record entries made by the scribe were at my direction. I  have reviewed the chart and agree that the record reflects my personal performance and is accurate and complete. [name], MD

## 2015-11-05 NOTE — Discharge Planning (Signed)
Pt d/c today to home with husband, vitals stable, no c/o pain, iv removed, pt belongings went with pt, discharge paperwork explained to family and pt , no questions for the nurse

## 2015-11-08 LAB — CULTURE, BLOOD (ROUTINE X 2)
CULTURE: NO GROWTH
CULTURE: NO GROWTH

## 2015-11-10 ENCOUNTER — Inpatient Hospital Stay (HOSPITAL_COMMUNITY)
Admission: EM | Admit: 2015-11-10 | Discharge: 2015-11-13 | DRG: 178 | Disposition: A | Discharge status: Hospice | Payer: Medicare Other | Attending: Internal Medicine | Admitting: Internal Medicine

## 2015-11-10 ENCOUNTER — Encounter (HOSPITAL_COMMUNITY): Payer: Self-pay | Admitting: Emergency Medicine

## 2015-11-10 ENCOUNTER — Emergency Department (HOSPITAL_COMMUNITY): Payer: Medicare Other

## 2015-11-10 DIAGNOSIS — Z833 Family history of diabetes mellitus: Secondary | ICD-10-CM | POA: Diagnosis not present

## 2015-11-10 DIAGNOSIS — I10 Essential (primary) hypertension: Secondary | ICD-10-CM | POA: Diagnosis present

## 2015-11-10 DIAGNOSIS — E785 Hyperlipidemia, unspecified: Secondary | ICD-10-CM | POA: Diagnosis present

## 2015-11-10 DIAGNOSIS — C7889 Secondary malignant neoplasm of other digestive organs: Secondary | ICD-10-CM | POA: Diagnosis present

## 2015-11-10 DIAGNOSIS — R627 Adult failure to thrive: Secondary | ICD-10-CM | POA: Diagnosis present

## 2015-11-10 DIAGNOSIS — Z681 Body mass index (BMI) 19 or less, adult: Secondary | ICD-10-CM

## 2015-11-10 DIAGNOSIS — Z841 Family history of disorders of kidney and ureter: Secondary | ICD-10-CM | POA: Diagnosis not present

## 2015-11-10 DIAGNOSIS — Z90710 Acquired absence of both cervix and uterus: Secondary | ICD-10-CM

## 2015-11-10 DIAGNOSIS — R1310 Dysphagia, unspecified: Secondary | ICD-10-CM | POA: Diagnosis present

## 2015-11-10 DIAGNOSIS — Z825 Family history of asthma and other chronic lower respiratory diseases: Secondary | ICD-10-CM | POA: Diagnosis not present

## 2015-11-10 DIAGNOSIS — C7800 Secondary malignant neoplasm of unspecified lung: Secondary | ICD-10-CM | POA: Diagnosis present

## 2015-11-10 DIAGNOSIS — E440 Moderate protein-calorie malnutrition: Secondary | ICD-10-CM | POA: Diagnosis present

## 2015-11-10 DIAGNOSIS — J449 Chronic obstructive pulmonary disease, unspecified: Secondary | ICD-10-CM | POA: Diagnosis present

## 2015-11-10 DIAGNOSIS — J189 Pneumonia, unspecified organism: Secondary | ICD-10-CM | POA: Diagnosis not present

## 2015-11-10 DIAGNOSIS — C7989 Secondary malignant neoplasm of other specified sites: Secondary | ICD-10-CM | POA: Diagnosis present

## 2015-11-10 DIAGNOSIS — Z87891 Personal history of nicotine dependence: Secondary | ICD-10-CM

## 2015-11-10 DIAGNOSIS — Z800 Family history of malignant neoplasm of digestive organs: Secondary | ICD-10-CM

## 2015-11-10 DIAGNOSIS — Z923 Personal history of irradiation: Secondary | ICD-10-CM | POA: Diagnosis not present

## 2015-11-10 DIAGNOSIS — Z820 Family history of epilepsy and other diseases of the nervous system: Secondary | ICD-10-CM

## 2015-11-10 DIAGNOSIS — E119 Type 2 diabetes mellitus without complications: Secondary | ICD-10-CM | POA: Diagnosis present

## 2015-11-10 DIAGNOSIS — R6251 Failure to thrive (child): Secondary | ICD-10-CM | POA: Diagnosis present

## 2015-11-10 DIAGNOSIS — K219 Gastro-esophageal reflux disease without esophagitis: Secondary | ICD-10-CM | POA: Diagnosis present

## 2015-11-10 DIAGNOSIS — Z66 Do not resuscitate: Secondary | ICD-10-CM | POA: Diagnosis present

## 2015-11-10 DIAGNOSIS — C799 Secondary malignant neoplasm of unspecified site: Secondary | ICD-10-CM | POA: Diagnosis present

## 2015-11-10 DIAGNOSIS — Z7189 Other specified counseling: Secondary | ICD-10-CM | POA: Diagnosis not present

## 2015-11-10 DIAGNOSIS — R410 Disorientation, unspecified: Secondary | ICD-10-CM

## 2015-11-10 DIAGNOSIS — C7931 Secondary malignant neoplasm of brain: Secondary | ICD-10-CM | POA: Diagnosis present

## 2015-11-10 DIAGNOSIS — C787 Secondary malignant neoplasm of liver and intrahepatic bile duct: Secondary | ICD-10-CM | POA: Diagnosis present

## 2015-11-10 DIAGNOSIS — Z7951 Long term (current) use of inhaled steroids: Secondary | ICD-10-CM

## 2015-11-10 DIAGNOSIS — J690 Pneumonitis due to inhalation of food and vomit: Secondary | ICD-10-CM | POA: Diagnosis present

## 2015-11-10 DIAGNOSIS — R0902 Hypoxemia: Secondary | ICD-10-CM | POA: Diagnosis present

## 2015-11-10 DIAGNOSIS — Z79891 Long term (current) use of opiate analgesic: Secondary | ICD-10-CM

## 2015-11-10 DIAGNOSIS — Z86010 Personal history of colonic polyps: Secondary | ICD-10-CM

## 2015-11-10 DIAGNOSIS — Z8249 Family history of ischemic heart disease and other diseases of the circulatory system: Secondary | ICD-10-CM | POA: Diagnosis not present

## 2015-11-10 DIAGNOSIS — Z515 Encounter for palliative care: Secondary | ICD-10-CM | POA: Diagnosis present

## 2015-11-10 DIAGNOSIS — Z9221 Personal history of antineoplastic chemotherapy: Secondary | ICD-10-CM

## 2015-11-10 DIAGNOSIS — Z85850 Personal history of malignant neoplasm of thyroid: Secondary | ICD-10-CM

## 2015-11-10 DIAGNOSIS — Z86711 Personal history of pulmonary embolism: Secondary | ICD-10-CM

## 2015-11-10 DIAGNOSIS — Z9189 Other specified personal risk factors, not elsewhere classified: Secondary | ICD-10-CM

## 2015-11-10 DIAGNOSIS — Z79899 Other long term (current) drug therapy: Secondary | ICD-10-CM

## 2015-11-10 DIAGNOSIS — C801 Malignant (primary) neoplasm, unspecified: Secondary | ICD-10-CM | POA: Diagnosis not present

## 2015-11-10 DIAGNOSIS — M797 Fibromyalgia: Secondary | ICD-10-CM | POA: Diagnosis present

## 2015-11-10 LAB — CBC WITH DIFFERENTIAL/PLATELET
BASOS ABS: 0 10*3/uL (ref 0.0–0.1)
BASOS PCT: 0 %
Eosinophils Absolute: 0 10*3/uL (ref 0.0–0.7)
Eosinophils Relative: 0 %
HEMATOCRIT: 40.6 % (ref 36.0–46.0)
HEMOGLOBIN: 12.9 g/dL (ref 12.0–15.0)
LYMPHS ABS: 0.4 10*3/uL — AB (ref 0.7–4.0)
Lymphocytes Relative: 2 %
MCH: 25.8 pg — AB (ref 26.0–34.0)
MCHC: 31.8 g/dL (ref 30.0–36.0)
MCV: 81.2 fL (ref 78.0–100.0)
MONOS PCT: 7 %
Monocytes Absolute: 1.4 10*3/uL — ABNORMAL HIGH (ref 0.1–1.0)
NEUTROS ABS: 18.3 10*3/uL — AB (ref 1.7–7.7)
Neutrophils Relative %: 91 %
Platelets: 309 10*3/uL (ref 150–400)
RBC: 5 MIL/uL (ref 3.87–5.11)
RDW: 21.8 % — ABNORMAL HIGH (ref 11.5–15.5)
WBC: 20.1 10*3/uL — ABNORMAL HIGH (ref 4.0–10.5)

## 2015-11-10 LAB — COMPREHENSIVE METABOLIC PANEL
ALBUMIN: 2.7 g/dL — AB (ref 3.5–5.0)
ALK PHOS: 62 U/L (ref 38–126)
ALT: 38 U/L (ref 14–54)
AST: 21 U/L (ref 15–41)
Anion gap: 9 (ref 5–15)
BILIRUBIN TOTAL: 0.8 mg/dL (ref 0.3–1.2)
BUN: 16 mg/dL (ref 6–20)
CO2: 17 mmol/L — ABNORMAL LOW (ref 22–32)
Calcium: 7.9 mg/dL — ABNORMAL LOW (ref 8.9–10.3)
Chloride: 109 mmol/L (ref 101–111)
Creatinine, Ser: 0.69 mg/dL (ref 0.44–1.00)
GFR calc Af Amer: >60 mL/min (ref >60)
GFR calc non Af Amer: >60 mL/min (ref >60)
GLUCOSE: 94 mg/dL (ref 65–99)
POTASSIUM: 3.6 mmol/L (ref 3.5–5.1)
Sodium: 135 mmol/L (ref 135–145)
TOTAL PROTEIN: 6.2 g/dL — AB (ref 6.5–8.1)

## 2015-11-10 LAB — BLOOD GAS, ARTERIAL
ACID-BASE DEFICIT: 6.2 mmol/L — AB (ref 0.0–2.0)
Bicarbonate: 20.1 mEq/L (ref 20.0–24.0)
Drawn by: 270161
O2 CONTENT: 2 L/min
O2 SAT: 94.5 %
PATIENT TEMPERATURE: 37
PCO2 ART: 26.7 mmHg — AB (ref 35.0–45.0)
PO2 ART: 78 mmHg — AB (ref 80.0–100.0)
TCO2: 17.6 mmol/L (ref 0–100)
pH, Arterial: 7.429 (ref 7.350–7.450)

## 2015-11-10 LAB — TROPONIN I: Troponin I: 0.07 ng/mL (ref <0.03)

## 2015-11-10 LAB — I-STAT CG4 LACTIC ACID, ED: Lactic Acid, Venous: 0.96 mmol/L (ref 0.5–1.9)

## 2015-11-10 MED ORDER — LIDOCAINE VISCOUS 2 % MT SOLN: 5.0000 mL | Freq: Four times a day (QID) | OROMUCOSAL | Status: DC | PRN

## 2015-11-10 MED ORDER — SODIUM CHLORIDE 0.9 % IV BOLUS (SEPSIS)
1000.0000 mL | Freq: Once | INTRAVENOUS | Status: AC
  Administered 2015-11-10: 1000 mL via INTRAVENOUS

## 2015-11-10 MED ORDER — PANTOPRAZOLE SODIUM 40 MG PO TBEC
40.0000 mg | DELAYED_RELEASE_TABLET | Freq: Two times a day (BID) | ORAL | Status: DC
  Administered 2015-11-10: 40 mg via ORAL
  Filled 2015-11-10 (×2): qty 1

## 2015-11-10 MED ORDER — HYDROCOD POLST-CPM POLST ER 10-8 MG/5ML PO SUER: 5.0000 mL | Freq: Two times a day (BID) | ORAL | Status: DC | PRN

## 2015-11-10 MED ORDER — MORPHINE SULFATE (PF) 2 MG/ML IV SOLN
2.0000 mg | Freq: Once | INTRAVENOUS | Status: AC
  Administered 2015-11-10: 2 mg via INTRAVENOUS
  Filled 2015-11-10: qty 1

## 2015-11-10 MED ORDER — ATROPINE ORAL SOLUTION 0.08 MG/ML
0.1600 mg | ORAL | Status: DC | PRN
  Filled 2015-11-10: qty 2

## 2015-11-10 MED ORDER — DEXTROSE 5 % IV SOLN
INTRAVENOUS | Status: AC
  Filled 2015-11-10 (×2): qty 2

## 2015-11-10 MED ORDER — LEVOTHYROXINE SODIUM 112 MCG PO TABS
112.0000 ug | ORAL_TABLET | Freq: Every day | ORAL | Status: DC
  Administered 2015-11-12 – 2015-11-13 (×2): 112 ug via ORAL
  Filled 2015-11-10 (×3): qty 1

## 2015-11-10 MED ORDER — RESOURCE THICKENUP CLEAR PO POWD
Freq: Every day | ORAL | Status: DC
  Administered 2015-11-11 – 2015-11-13 (×3): via ORAL
  Filled 2015-11-10: qty 125

## 2015-11-10 MED ORDER — ALBUTEROL SULFATE (2.5 MG/3ML) 0.083% IN NEBU: 2.5000 mg | INHALATION_SOLUTION | RESPIRATORY_TRACT | Status: DC | PRN

## 2015-11-10 MED ORDER — MAGIC MOUTHWASH: 5.0000 mL | Freq: Four times a day (QID) | ORAL | Status: DC | PRN

## 2015-11-10 MED ORDER — VANCOMYCIN HCL IN DEXTROSE 750-5 MG/150ML-% IV SOLN
750.0000 mg | Freq: Two times a day (BID) | INTRAVENOUS | Status: DC
  Administered 2015-11-11: 750 mg via INTRAVENOUS
  Filled 2015-11-10 (×3): qty 150

## 2015-11-10 MED ORDER — GLYCOPYRROLATE 0.2 MG/ML IJ SOLN: 0.1000 mg | Freq: Once | INTRAMUSCULAR | Status: DC

## 2015-11-10 MED ORDER — ACETAMINOPHEN 325 MG PO TABS
650.0000 mg | ORAL_TABLET | Freq: Four times a day (QID) | ORAL | Status: DC | PRN
  Administered 2015-11-11: 650 mg via ORAL
  Filled 2015-11-10: qty 2

## 2015-11-10 MED ORDER — ENOXAPARIN SODIUM 40 MG/0.4ML ~~LOC~~ SOLN
40.0000 mg | SUBCUTANEOUS | Status: DC
  Administered 2015-11-10: 40 mg via SUBCUTANEOUS
  Filled 2015-11-10: qty 0.4

## 2015-11-10 MED ORDER — OXYCODONE HCL 5 MG PO TABS
20.0000 mg | ORAL_TABLET | ORAL | Status: DC | PRN
  Filled 2015-11-10: qty 4

## 2015-11-10 MED ORDER — LEVOFLOXACIN IN D5W 750 MG/150ML IV SOLN
750.0000 mg | Freq: Once | INTRAVENOUS | Status: AC
Start: 2015-11-10 — End: 2015-11-10
  Administered 2015-11-10: 750 mg via INTRAVENOUS
  Filled 2015-11-10: qty 150

## 2015-11-10 MED ORDER — VANCOMYCIN HCL IN DEXTROSE 1-5 GM/200ML-% IV SOLN
1000.0000 mg | Freq: Once | INTRAVENOUS | Status: AC
  Administered 2015-11-10: 1000 mg via INTRAVENOUS
  Filled 2015-11-10: qty 200

## 2015-11-10 MED ORDER — MAGNESIUM OXIDE 400 (241.3 MG) MG PO TABS
400.0000 mg | ORAL_TABLET | Freq: Two times a day (BID) | ORAL | Status: DC
  Administered 2015-11-10: 400 mg via ORAL
  Filled 2015-11-10 (×2): qty 1

## 2015-11-10 MED ORDER — IPRATROPIUM-ALBUTEROL 0.5-2.5 (3) MG/3ML IN SOLN
3.0000 mL | Freq: Once | RESPIRATORY_TRACT | Status: AC
  Administered 2015-11-10: 3 mL via RESPIRATORY_TRACT
  Filled 2015-11-10: qty 3

## 2015-11-10 MED ORDER — BUDESONIDE 0.25 MG/2ML IN SUSP
0.2500 mg | Freq: Two times a day (BID) | RESPIRATORY_TRACT | Status: DC
  Administered 2015-11-10 – 2015-11-13 (×6): 0.25 mg via RESPIRATORY_TRACT
  Filled 2015-11-10 (×10): qty 2

## 2015-11-10 MED ORDER — DEXTROSE 5 % IV SOLN
2.0000 g | Freq: Three times a day (TID) | INTRAVENOUS | Status: DC
  Administered 2015-11-10 – 2015-11-11 (×2): 2 g via INTRAVENOUS
  Filled 2015-11-10 (×6): qty 2

## 2015-11-10 MED ORDER — POLYETHYLENE GLYCOL 3350 17 G PO PACK: 17.0000 g | PACK | Freq: Every day | ORAL | Status: DC | PRN

## 2015-11-10 MED ORDER — SERTRALINE HCL 50 MG PO TABS
50.0000 mg | ORAL_TABLET | Freq: Every day | ORAL | Status: DC
  Administered 2015-11-12 – 2015-11-13 (×2): 50 mg via ORAL
  Filled 2015-11-10 (×3): qty 1

## 2015-11-10 MED ORDER — IOPAMIDOL (ISOVUE-300) INJECTION 61%
75.0000 mL | Freq: Once | INTRAVENOUS | Status: AC | PRN
  Administered 2015-11-10: 75 mL via INTRAVENOUS

## 2015-11-10 MED ORDER — MEGESTROL ACETATE 400 MG/10ML PO SUSP
400.0000 mg | Freq: Every day | ORAL | Status: DC
  Filled 2015-11-10: qty 10

## 2015-11-10 MED ORDER — IPRATROPIUM-ALBUTEROL 0.5-2.5 (3) MG/3ML IN SOLN
3.0000 mL | Freq: Four times a day (QID) | RESPIRATORY_TRACT | Status: DC
  Administered 2015-11-10 – 2015-11-13 (×11): 3 mL via RESPIRATORY_TRACT
  Filled 2015-11-10 (×12): qty 3

## 2015-11-10 MED ORDER — MAGIC MOUTHWASH W/LIDOCAINE: 5.0000 mL | Freq: Four times a day (QID) | ORAL | Status: DC | PRN

## 2015-11-10 MED ORDER — PREDNISONE 10 MG PO TABS
10.0000 mg | ORAL_TABLET | Freq: Every day | ORAL | Status: DC
  Administered 2015-11-12 – 2015-11-13 (×2): 10 mg via ORAL
  Filled 2015-11-10 (×2): qty 1

## 2015-11-10 NOTE — ED Notes (Signed)
Wasn't able to obtain urine from pt during the In and Out Catherterization. Family reported that pt hasn't drank anything in two days.

## 2015-11-10 NOTE — ED Notes (Addendum)
CRITICAL VALUE ALERT  Critical value received:  Troponin 0.07  Date of notification:  11/10/15  Time of notification:  6195  Critical value read back:Yes.    Nurse who received alert:  RCockram  Dr Gilford Raid notified

## 2015-11-10 NOTE — ED Provider Notes (Signed)
[facility] DEPT Provider Note   CSN: [id number] Arrival date & time: 11/10/15  1203  First Provider Contact:  First MD Initiated Contact with Patient 11/10/15 1210     By signing my name below, I, Rayna Sexton, attest that this documentation has been prepared under the direction and in the presence of Isla Pence, MD. Electronically Signed: Rayna Sexton, ED Scribe. 11/10/15. 12:48 PM.    History   Chief Complaint Chief Complaint  Patient presents with  . Altered Mental Status    HPI HPI Comments: Haley Lowe is a 60 y.o. female with a significant medical history listed below who presents to the Emergency Department with her family due to AMS which began this morning. Her daughter states she is a CA patient and denies she has been on chemotherapy for the past 2-3 months. Pt has been admitted recently due to aspiration pneumonia.  She was on vancomycin from 7/30-8/2, on primaxin from 7/31-8/2 and on clindamycin from 82-8/5.  She was evaluated by Dr. Luan Pulling recently put pt on Ceftin. Her daughter states she was better until this weekend and began experiencing worsening weakness and SOB.  O2 sats were in the 80s at home.  She denies she regularly wears O2 at home. Her daughter denies she has experienced fevers or other associated symptoms at this time.   The history is provided by a relative. No language interpreter was used.    Past Medical History:  Diagnosis Date  . Anxiety   . Arthritis   . Borderline diabetes mellitus   . Cataract    1 lens implaqnt right eye,intact on left eye  . Controlled diabetes mellitus type II without complication ([place]) 99/37/1696   Previously diagnosed with borderline diabetes.  Marland Kitchen COPD (chronic obstructive pulmonary disease) ([place])   . Depression   . DVT (deep venous thrombosis) ([place]) 12/09/2011  . Emphysema   . Essential hypertension   . Fibromyalgia   . GERD (gastroesophageal reflux disease)   . Goiter   . HCAP (healthcare-associated  pneumonia) 03/03/2015  . Headache(784.0)   . 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 History of radiation therapy 10/06/11   SRS 15Gy 37f, brain  . Hx of adenomatous colonic polyps   . Hyperlipidemia   . Lung nodule seen on imaging study 11/04/11 CT   449mLLL  . Metastatic adenocarcinoma to brain (HCS[facility]6/12/13   Left frontoparietal region  . Mucositis due to radiation therapy 03/03/2015  . PONV (postoperative nausea and vomiting)   . Pulmonary emboli ([facility]CK[place]8/1/13   Right upper lobe and r lower lobe PE  . Pulmonary embolism ([facility]9/08/2011  . S/P radiation therapy 05/31/14 SRS   Brain  . Thyroid cancer ([facility]2015  . UTI (lower urinary tract infection)     Patient Active Problem List   Diagnosis Date Noted  . DNR (do not resuscitate) 11/03/2015  . Generalized weakness 10/09/2015  . Cough 10/09/2015  . Weakness 05/04/2015  . Near syncope 05/04/2015  . Hypomagnesemia 05/01/2015  . Palliative care encounter   . DNR (do not resuscitate) discussion   . Controlled diabetes mellitus type II without complication ([facility]1278/93/8101. Pressure ulcer 03/16/2015  . Antineoplastic chemotherapy induced pancytopenia (HC[facility]its[place]12/02/2015  . Hypotension 03/15/2015  . 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 Mucositis oral   . Esophagitis 03/09/2015  . Protein-calorie malnutrition, severe 03/07/2015  . Dysphagia   . Odynophagia   . Barrett's esophagus with dysplasia   . Esophageal reflux   .  Metastatic cancer ([place])   . Mucosal abnormality of esophagus   . Aspiration pneumonia ([place]) 03/03/2015  . Mucositis due to radiation therapy 03/03/2015  . COPD (chronic obstructive pulmonary disease) ([place]) 03/03/2015  . Hyponatremia 03/03/2015  . Hypokalemia 03/03/2015  . Leukopenia 03/03/2015  . Thrombocytopenia ([place]) 03/03/2015  . Hypothyroid 03/03/2015  . Dehydration   . GERD (gastroesophageal reflux disease) 07/29/2014  . Chest tightness 07/28/2014  . Primary papillary  thyroid carcinoma ([place]) 11/21/2013  . Occlusion and stenosis of carotid artery without mention of cerebral infarction 07/18/2012  . DVT (deep venous thrombosis) ([place]) 12/09/2011  . Pulmonary embolism ([place]) 12/09/2011  . COPD with asthma ([place]) 10/18/2011  . Fibromyalgia   . Pulmonary nodules   . Primary cancer of thyroid with metastasis brain/ lung/ throat/ neck/ stomach [facility])     Past Surgical History:  Procedure Laterality Date  . ABDOMINAL HYSTERECTOMY  1996  . BIOPSY  03/07/2015   Procedure: BIOPSY;  Surgeon: Daneil Dolin, MD;  Location[facility] ENDO SUITE;  Service: Endoscopy;;  . CATARACT EXTRACTION  2011   With lens implant  . CHOLECYSTECTOMY  2000  . CRANIOTOMY  09/15/2011   Procedure: CRANIOTOMY TUMOR EXCISION;  Surgeon: Hosie Spangle, MD;  Location: [facility] NEURO ORS;  Service: Neurosurgery;  Laterality: N/A;  Craniotomy resection of tumor with stealth  . ESOPHAGOGASTRODUODENOSCOPY (EGD) WITH PROPOFOL N/A 03/07/2015   Procedure: ESOPHAGOGASTRODUODENOSCOPY (EGD) WITH PROPOFOL;  Surgeon: Daneil Dolin, MD;  Location: [facility] ENDO SUITE;  Service: Endoscopy;  Laterality: N/A;  . EYE SURGERY  2001  . FOOT SURGERY Left 2005  . LYMPH GLAND EXCISION    . SHOULDER SURGERY Right 1998  . TOTAL THYROIDECTOMY  10-29-2013   [facility]  . VIDEO BRONCHOSCOPY WITH ENDOBRONCHIAL ULTRASOUND N/A 04/11/2014   Procedure: VIDEO BRONCHOSCOPY WITH ENDOBRONCHIAL ULTRASOUND;  Surgeon: Melrose Nakayama, MD;  Location: [facility] OR;  Service: Thoracic;  Laterality: N/A;    OB History    Gravida Para Term Preterm AB Living   '5 5 5         '$ SAB TAB Ectopic Multiple Live Births                   Home Medications    Prior to Admission medications   Medication Sig Start Date End Date Taking? Authorizing Provider  acetaminophen (TYLENOL) 325 MG tablet Take 2 tablets (650 mg total) by mouth every 6 (six) hours as needed for mild pain. 05/10/15  Yes Lavina Hamman, MD  albuterol (PROVENTIL  HFA;VENTOLIN HFA) 108 (90 Base) MCG/ACT inhaler Inhale 2 puffs into the lungs every 6 (six) hours as needed for wheezing or shortness of breath. 10/13/15  Yes Reyne Dumas, MD  albuterol (PROVENTIL) (2.5 MG/3ML) 0.083% nebulizer solution Take 3 mLs (2.5 mg total) by nebulization every 4 (four) hours as needed for wheezing or shortness of breath. 04/25/15  Yes Orvan Falconer, MD  budesonide-formoterol [facility]) 160-4.5 MCG/ACT inhaler Inhale 2 puffs into the lungs 2 (two) times daily.   Yes Historical Provider, MD  cefUROXime (CEFTIN) 500 MG tablet Take 500 mg by mouth 2 (two) times daily with a meal.   Yes Historical Provider, MD  chlorpheniramine-HYDROcodone (TUSSIONEX PENNKINETIC ER) 10-8 MG/5ML SUER Take 5 mLs by mouth every 12 (twelve) hours as needed for cough. 10/13/15  Yes Reyne Dumas, MD  levothyroxine (SYNTHROID, LEVOTHROID) 112 MCG tablet Take 112 mcg by mouth daily before breakfast.   Yes Historical Provider, MD  magic mouthwash w/lidocaine  SOLN Take 5 mLs by mouth 4 (four) times daily as needed for mouth pain. 03/13/15  Yes Velvet Bathe, MD  magnesium oxide (MAG-OX) 400 (241.3 Mg) MG tablet Take 1 tablet (400 mg total) by mouth 2 (two) times daily. 10/13/15  Yes Reyne Dumas, MD  megestrol (MEGACE) 400 MG/10ML suspension Take 10 mLs (400 mg total) by mouth daily. 05/10/15  Yes Lavina Hamman, MD  ondansetron (ZOFRAN ODT) 8 MG disintegrating tablet Take 1 tablet (8 mg total) by mouth every 8 (eight) hours as needed for nausea or vomiting. 04/05/14  Yes Pattricia Boss, MD  Oxycodone HCl 20 MG TABS Take 1 tablet by mouth every hour as needed (pain).    Yes Historical Provider, MD  pantoprazole (PROTONIX) 40 MG tablet Take 1 tablet (40 mg total) by mouth 2 (two) times daily. 03/13/15  Yes Velvet Bathe, MD  polyethylene glycol (MIRALAX / GLYCOLAX) packet Take 17 g by mouth daily as needed for moderate constipation. Reported on 04/27/2015   Yes Historical Provider, MD  potassium chloride SA (K-DUR,KLOR-CON) 20  MEQ tablet Take 40 mEq by mouth 2 (two) times daily. Dissolve in water and take either in applesauce or drink the water.   Yes Historical Provider, MD  predniSONE (DELTASONE) 20 MG tablet Take daily by mouth: 40 mg x3 days, then 20 mg x3 days, then 10 mg x3 days, then stop. 11/05/15  Yes Samuella Cota, MD  sertraline (ZOLOFT) 50 MG tablet Take 50 mg by mouth daily.   Yes Historical Provider, MD  Starch-Maltodextrin (THICK-IT PO) Take by mouth daily. Add to all liquid.   Yes Historical Provider, MD  tiotropium (SPIRIVA) 18 MCG inhalation capsule Place 18 mcg into inhaler and inhale daily.   Yes Historical Provider, MD  clindamycin (CLEOCIN) 300 MG capsule Take 1 capsule (300 mg total) by mouth 4 (four) times daily. Patient not taking: Reported on 11/10/2015 11/05/15   Samuella Cota, MD    Family History Family History  Problem Relation Age of Onset  . Asthma Mother   . Kidney failure Father   . Diabetes Sister   . Heart attack Sister   . COPD Sister   . Colon cancer Brother   . Aneurysm Paternal Grandmother     Brain  . Parkinsonism Maternal Uncle   . COPD Brother     Social History Social History  Substance Use Topics  . Smoking status: Former Smoker    Packs/day: 1.00    Years: 40.00    Types: Cigarettes    Quit date: 04/06/2011  . Smokeless tobacco: Never Used  . Alcohol use No     Allergies   Penicillins; Other; Codeine; Potassium-containing compounds; and Tape   Review of Systems Review of Systems  Constitutional: Positive for activity change and fatigue. Negative for fever.  Respiratory: Positive for shortness of breath.   Neurological: Positive for weakness.  Psychiatric/Behavioral: Positive for confusion.  All other systems reviewed and are negative.  Physical Exam Updated Vital Signs BP 125/83   Pulse 80   Temp 97.8 F (36.6 C) (Axillary)   Resp 20   Ht '5\' 3"'$  (1.6 m)   Wt 115 lb (52.2 kg)   SpO2 91%   BMI 20.37 kg/m   Physical Exam    Constitutional: She is oriented to person, place, and time. She appears well-developed. She appears distressed.  Pt not speaking clearly when questioned   HENT:  Head: Normocephalic and atraumatic.  Eyes: EOM are normal.  Neck: Normal  range of motion.  Cardiovascular: Tachycardia present.   Pulmonary/Chest: Effort normal. No respiratory distress. She has rales.  Diffuse rhonchi and rales noted bilaterally   Abdominal: Soft.  Musculoskeletal: Normal range of motion.  Neurological: She is alert and oriented to person, place, and time.  Skin: Skin is warm and dry.  Nursing note and vitals reviewed.  ED Treatments / Results  Labs (all labs ordered are listed, but only abnormal results are displayed) Labs Reviewed  COMPREHENSIVE METABOLIC PANEL - Abnormal; Notable for the following:       Result Value   CO2 17 (*)    Calcium 7.9 (*)    Total Protein 6.2 (*)    Albumin 2.7 (*)    All other components within normal limits  CBC WITH DIFFERENTIAL/PLATELET - Abnormal; Notable for the following:    WBC 20.1 (*)    MCH 25.8 (*)    RDW 21.8 (*)    Neutro Abs 18.3 (*)    Lymphs Abs 0.4 (*)    Monocytes Absolute 1.4 (*)    All other components within normal limits  TROPONIN I - Abnormal; Notable for the following:    Troponin I 0.07 (*)    All other components within normal limits  BLOOD GAS, ARTERIAL - Abnormal; Notable for the following:    pCO2 arterial 26.7 (*)    pO2, Arterial 78 (*)    Acid-base deficit 6.2 (*)    All other components within normal limits  CULTURE, BLOOD (ROUTINE X 2)  CULTURE, BLOOD (ROUTINE X 2)  URINE CULTURE  URINALYSIS, ROUTINE W REFLEX MICROSCOPIC (NOT AT [facility])  I-STAT CG4 LACTIC ACID, ED  I-STAT CG4 LACTIC ACID, ED    EKG  EKG Interpretation  Date/Time:  Monday November 10 2015 12:16:08 EDT Ventricular Rate:  92 PR Interval:    QRS Duration: 97 QT Interval:  356 QTC Calculation: 441 R Axis:   -34 Text Interpretation:  Sinus rhythm Low  voltage, extremity and precordial leads Probable anteroseptal infarct, old Confirmed by [facility] MD, Ayaan Ringle ([id number]) on 11/10/2015 12:59:16 PM       Radiology Ct Head Wo Contrast  Result Date: 11/10/2015 CLINICAL DATA:  60 year old female with altered mental status. 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 Subsequent encounter. EXAM: CT HEAD WITHOUT CONTRAST TECHNIQUE: Contiguous axial images were obtained from the base of the skull through the vertex without intravenous contrast. COMPARISON:  07/21/2015 MR. 06/18/2015 CT. FINDINGS: Global atrophy.  No hydrocephalus. Prior left posterior frontal -parietal craniotomy with encephalomalacia below the craniotomy site similar to prior exam. Hypodensity left cerebellum may represent streak artifact or result of tumor. Patient was noted to have metastatic lesions in the cerebellum on recent MR. Prominent white matter changes may indicate result of treatment of tumor without CT evidence of large acute infarct. No intracranial hemorrhage. Scattered minimal calvarial lucencies are stable. Visualized mastoid air cells, middle ear cavities and paranasal sinuses are clear. No acute orbital abnormality. IMPRESSION: Global atrophy. Prior left posterior frontal -parietal craniotomy with encephalomalacia below the craniotomy site similar to prior exam. Hypodensity left cerebellum may represent streak artifact or result of tumor. Patient was noted to have metastatic lesions in the cerebellum on recent MR. Prominent white matter changes may indicate result of treatment of tumor. No intracranial hemorrhage or CT evidence of large acute infarct. Electronically Signed   By: Genia Del M.D.   On: 11/10/2015 16:30   Ct Chest W Contrast  Result  Date: 11/10/2015 CLINICAL DATA:  Shortness of breath EXAM: CT CHEST WITH CONTRAST TECHNIQUE: Multidetector CT imaging of the chest was performed during intravenous contrast  administration. CONTRAST:  58m ISOVUE-300 IOPAMIDOL (ISOVUE-300) INJECTION 61% COMPARISON:  10/09/2015 FINDINGS: Cardiovascular: Heart size is at upper normal. Tiny pericardial effusion persists. Coronary artery calcification is noted. No evidence for dissection of the thoracic aorta. No thoracic aortic aneurysm. Bolus timing is suboptimal for assessment of pulmonary embolism. Despite the poor opacification of the pulmonary arteries, there is sufficient contrast to exclude large central pulmonary embolus in the main pulmonary arteries or lobar pulmonary arteries. Mediastinum/Nodes: No mediastinal lymphadenopathy. 12 mm short axis left hilar lymph node measured previously is unchanged. 10 mm short axis right hilar lymph node measured on the previous study is now 9 mm. Lungs/Pleura: Moderate to advanced changes of emphysema are noted bilaterally. There has been interval development of relatively diffuse bilateral interstitial and central parenchymal nodularity, having a tree in bud/peribronchovascular distribution in some areas and not so much in others. Scattered areas of ill-defined airspace consolidation are seen in the periphery of the upper lobes bilaterally. 11 mm pulmonary nodule seen in the posterior right lower lobe on the previous study measures 8 mm today. Upper Abdomen: Calcified lymphadenopathy in the gastrohepatic ligament, hepatoduodenal ligament, retrocrural space, and upper retroperitoneum shows no substantial interval change. 3 the ring-enhancing lesions are identified within the visualized liver measuring up to 17 mm (image 142 series 2). Musculoskeletal: Bone windows reveal no worrisome lytic or sclerotic osseous lesions. IMPRESSION: 1. Interval progression of the interstitial disease and peribronchovascular ground-glass opacities and nodularity, concerning for continued progression of lymphangitic tumor spread. 2. Interval development of rim enhancing necrotic lesions in the liver parenchyma,  consistent with new hepatic metastases. 3. No substantial change in the mineralized mediastinal, retrocrural, and upper abdominal lymphadenopathy in this patient with a history of metastatic thyroid cancer. 4. Moderate changes of emphysema again noted. 5. Abdominal aortic atherosclerosis. Electronically Signed   By: EMisty StanleyM.D.   On: 11/10/2015 16:32   Dg Chest Port 1 View  Result Date: 11/10/2015 CLINICAL DATA:  Fever with shortness of breath. EXAM: PORTABLE CHEST 1 VIEW COMPARISON:  11/02/2015 FINDINGS: Slight improvement aeration at the right lung base with persistent right mid lung airspace opacity. Interstitial markings are diffusely coarsened with chronic features. The cardio pericardial silhouette is enlarged. The visualized bony structures of the thorax are intact. Telemetry leads overlie the chest. IMPRESSION: Slight interval improvement in aeration at the right base. Otherwise stable. Electronically Signed   By: EMisty StanleyM.D.   On: 11/10/2015 12:53    Procedures .Critical Care Performed by: HIsla PenceAuthorized by: HIsla Pence  Critical care provider statement:    Critical care time (minutes):  30   Critical care time was exclusive of:  Separately billable procedures and treating other patients   Critical care was necessary to treat or prevent imminent or life-threatening deterioration of the following conditions:  Respiratory failure   Critical care was time spent personally by me on the following activities:  Development of treatment plan with patient or surrogate, re-evaluation of patient's condition, review of old charts, evaluation of patient's response to treatment and discussions with consultants     COORDINATION OF CARE: 12:18 PM Discussed next steps with her relative. She verbalized understanding and is agreeable with the plan.    Medications Ordered in ED Medications  glycopyrrolate (ROBINUL) injection 0.1 mg (not administered)  morphine 2 MG/ML  injection 2 mg (  not administered)  levofloxacin (LEVAQUIN) IVPB 750 mg (0 mg Intravenous Stopped 11/10/15 1458)  ipratropium-albuterol (DUONEB) 0.5-2.5 (3) MG/3ML nebulizer solution 3 mL (3 mLs Nebulization Given 11/10/15 1249)  vancomycin (VANCOCIN) IVPB 1000 mg/200 mL premix (0 mg Intravenous Stopped 11/10/15 1558)  sodium chloride 0.9 % bolus 1,000 mL (0 mLs Intravenous Stopped 11/10/15 1539)  iopamidol (ISOVUE-300) 61 % injection 75 mL (75 mLs Intravenous Contrast Given 11/10/15 1549)  sodium chloride 0.9 % bolus 1,000 mL (1,000 mLs Intravenous New Bag/Given 11/10/15 1636)     Initial Impression / Assessment and Plan / ED Course  I have reviewed the triage vital signs and the nursing notes.  Pertinent labs & imaging results that were available during my care of the patient were reviewed by me and considered in my medical decision making (see chart for details).  Clinical Course    I personally performed the services described in this documentation, which was scribed in my presence. The recorded information has been reviewed and is accurate.  Pt d/w Dr. Marthenia Rolling for admission.  He felt the pt needed Hospice.  I spoke with pt to see if she was ok with that.  I explained that her cancer is spreading and her sob is likely due to increased tumor load.  She is ok that we consult hospice.  I spoke with the palliative medicine specialist on call at [facility] as the [facility] specialist was gone.  She suggested using the palliative care order set.  [facility] Is closed now, but they will be contacted tomorrow.  Final Clinical Impressions(s) / ED Diagnoses   Final diagnoses:  Delirium  Hypoxia  Metastatic cancer [facility])    New Prescriptions New Prescriptions   No medications on file     Isla Pence, MD 11/10/15 2018

## 2015-11-10 NOTE — ED Notes (Signed)
Two attempts on 2nd IV, vein blew

## 2015-11-10 NOTE — ED Notes (Signed)
Respiratory at the bedside

## 2015-11-10 NOTE — ED Notes (Signed)
Hospitalist at the bedside 

## 2015-11-10 NOTE — ED Notes (Signed)
Lab at the bedside 

## 2015-11-10 NOTE — ED Notes (Signed)
Pt on bedpan.

## 2015-11-10 NOTE — Progress Notes (Signed)
Pharmacy Antibiotic Note  Haley Lowe is a 60 y.o. female admitted on 11/10/2015 with pneumonia.  Pharmacy has been consulted for vancomycin dosing. She was dc'ed from hospital 8/2 with aspiration PNA.    Plan: Vancomycin 1 gm IV X 1 then 750 mg IV q12 hours F/u renal function, cultures and clinical course  Height: '5\' 3"'$  (160 cm) Weight: 115 lb (52.2 kg) IBW/kg (Calculated) : 52.4  Temp (24hrs), Avg:97.8 F (36.6 C), Min:97.8 F (36.6 C), Max:97.8 F (36.6 C)   Recent Labs Lab 11/04/15 0500 11/05/15 0523  WBC 11.5* 10.8*  CREATININE 0.54 0.49    Estimated Creatinine Clearance: 62.4 mL/min (by C-G formula based on SCr of 0.8 mg/dL).    Allergies  Allergen Reactions  . Penicillins Swelling    " my brain swelled, and mymouth" Has patient had a PCN reaction causing immediate rash, facial/tongue/throat swelling, SOB or lightheadedness with hypotension: Yes Has patient had a PCN reaction causing severe rash involving mucus membranes or skin necrosis: No Has patient had a PCN reaction that required hospitalization No Has patient had a PCN reaction occurring within the last 10 years: No If all of the above answers are "NO", then may proceed with Cephalosporin use.   . Other Swelling    PT ALSO REACTS TO PAPER TAPE AND BAND-AIDS.  Marland Kitchen Codeine Itching    Mild takes benadryl  . Potassium-Containing Compounds Other (See Comments)    Potassium Liquid only- burns the mouth and throat, pt is able to take tablets dissolved.  . Tape Rash    Antimicrobials this admission: vanc 8/7 >>  levaquin 8/7 >>   Thank you for allowing pharmacy to be a part of this patient's care.  Beverlee Nims 11/10/2015 12:38 PM

## 2015-11-10 NOTE — H&P (Signed)
History and Physical  Haley Lowe KDX:[id number] DOB: 04/07/1955 DOA: 11/10/2015  Referring physician: ER Physician PCP: [name], MD  Outpatient Specialists: Oncology and Pulmonary   Patient coming from: Home  Chief Complaint: SOB and increased secretions  HPI: 60 year old female with metastatic cancer and recurrent aspiration and aspiration pneumonia, discharged from this hospital about 5 days ago after inpatient treatment for aspiration pneumonia. Patient is known to an Materials engineer at [facility] but has not been able to follow up with the Oncologist in a while due to recurrent inpatient care for pneumonia. Patient is also known to the Pulmonologist, Dr. Luan Pulling. The patient could not give much history. Most of the history came from patient's 2 daughters and husband. Patient presents with SOB, with Oxygen saturation in the 80's, and increased secretions. No fever or chills. Patient is also very weak. CT Chest done revealed continued progression of lymphangitic tumor spread and interval interval development of rim enhancing necrotic lesions in the liver parenchyma, consistent with new hepatic metastases. Worsening leukocytosis is noted, but patient is on oral prednisone. No fever or chills. No headache, no neck pain, no chest pain, no GI symptoms.   Discussed with the patient, patient's family and the ER Physician. All understand that patient's prognosis is poor. Comfort directed measure was discussed.   ED Course: Initial work up for possible pneumonia  Pertinent labs: CT Chest finding and Leukocytosis Imaging: independently reviewed.   Review of Systems:  Negative for fever, visual changes, sore throat, rash, new muscle aches, chest pain, dysuria, bleeding, n/v/abdominal pain.  Past Medical History:  Diagnosis Date  . Anxiety   . Arthritis   . Borderline diabetes mellitus   . Cataract    1 lens implaqnt right eye,intact on left eye  . Controlled diabetes mellitus type II  without complication ([place]) 97/67/3419   Previously diagnosed with borderline diabetes.  Marland Kitchen COPD (chronic obstructive pulmonary disease) ([place])   . Depression   . DVT (deep venous thrombosis) ([place]) 12/09/2011  . Emphysema   . Essential hypertension   . Fibromyalgia   . GERD (gastroesophageal reflux disease)   . Goiter   . HCAP (healthcare-associated pneumonia) 03/03/2015  . Headache(784.0)   . 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 History of radiation therapy 10/06/11   SRS 15Gy 76f, brain  . Hx of adenomatous colonic polyps   . Hyperlipidemia   . Lung nodule seen on imaging study 11/04/11 CT   429mLLL  . Metastatic adenocarcinoma to brain ([facility]6/12/13   Left frontoparietal region  . Mucositis due to radiation therapy 03/03/2015  . PONV (postoperative nausea and vomiting)   . Pulmonary emboli ([facility] [place]8/1/13   Right upper lobe and r lower lobe PE  . Pulmonary embolism ([facility] [place]/08/2011  . S/P radiation therapy 05/31/14 SRS   Brain  . Thyroid cancer ([facility]  . UTI (lower urinary tract infection)     Past Surgical History:  Procedure Laterality Date  . ABDOMINAL HYSTERECTOMY  1996  . BIOPSY  03/07/2015   Procedure: BIOPSY;  Surgeon: RoDaneil DolinMD;  [facility];  Service: Endoscopy;;  . CATARACT EXTRACTION  2011   With lens implant  . CHOLECYSTECTOMY  2000  . CRANIOTOMY  09/15/2011   Procedure: CRANIOTOMY TUMOR EXCISION;  Surgeon: RoHosie SpangleMD;  [facility];  Service: Neurosurgery;  Laterality: N/A;  Craniotomy resection of tumor with stealth  . ESOPHAGOGASTRODUODENOSCOPY (EGD) WITH PROPOFOL N/A 03/07/2015  Procedure: ESOPHAGOGASTRODUODENOSCOPY (EGD) WITH PROPOFOL;  Surgeon: Daneil Dolin, MD;  Location[facility];  Service: Endoscopy;  Laterality: N/A;  . EYE SURGERY  2001  . FOOT SURGERY Left 2005  . LYMPH GLAND EXCISION    . SHOULDER SURGERY Right 1998  . TOTAL THYROIDECTOMY  10-29-2013   [facility]  . VIDEO BRONCHOSCOPY WITH ENDOBRONCHIAL ULTRASOUND N/A 04/11/2014   Procedure: VIDEO BRONCHOSCOPY WITH ENDOBRONCHIAL ULTRASOUND;  Surgeon: Melrose Nakayama, MD;  Location: [place];  Service: Thoracic;  Laterality: N/A;     reports that she quit smoking about 4 years ago. Her smoking use included Cigarettes. She has a 40.00 pack-year smoking history. She has never used smokeless tobacco. She reports that she does not drink alcohol or use drugs.  Allergies  Allergen Reactions  . Penicillins Swelling    " my brain swelled, and mymouth" Has patient had a PCN reaction causing immediate rash, facial/tongue/throat swelling, SOB or lightheadedness with hypotension: Yes Has patient had a PCN reaction causing severe rash involving mucus membranes or skin necrosis: No Has patient had a PCN reaction that required hospitalization No Has patient had a PCN reaction occurring within the last 10 years: No If all of the above answers are "NO", then may proceed with Cephalosporin use.   . Other Swelling    PT ALSO REACTS TO PAPER TAPE AND BAND-AIDS.  Marland Kitchen Codeine Itching    Mild takes benadryl  . Potassium-Containing Compounds Other (See Comments)    Potassium Liquid only- burns the mouth and throat, pt is able to take tablets dissolved.  . Tape Rash    Family History  Problem Relation Age of Onset  . Asthma Mother   . Kidney failure Father   . Diabetes Sister   . Heart attack Sister   . COPD Sister   . Colon cancer Brother   . Aneurysm Paternal Grandmother     Brain  . Parkinsonism Maternal Uncle   . COPD Brother      Prior to Admission medications   Medication Sig Start Date End Date Taking? Authorizing Provider  acetaminophen (TYLENOL) 325 MG tablet Take 2 tablets (650 mg total) by mouth every 6 (six) hours as needed for mild pain. 05/10/15  Yes Lavina Hamman, MD  albuterol (PROVENTIL HFA;VENTOLIN HFA) 108 (90 Base) MCG/ACT inhaler Inhale 2 puffs into the lungs every 6 (six)  hours as needed for wheezing or shortness of breath. 10/13/15  Yes Reyne Dumas, MD  albuterol (PROVENTIL) (2.5 MG/3ML) 0.083% nebulizer solution Take 3 mLs (2.5 mg total) by nebulization every 4 (four) hours as needed for wheezing or shortness of breath. 04/25/15  Yes Orvan Falconer, MD  budesonide-formoterol [facility] 160-4.5 MCG/ACT inhaler Inhale 2 puffs into the lungs 2 (two) times daily.   Yes Historical Provider, MD  cefUROXime (CEFTIN) 500 MG tablet Take 500 mg by mouth 2 (two) times daily with a meal.   Yes Historical Provider, MD  chlorpheniramine-HYDROcodone (TUSSIONEX PENNKINETIC ER) 10-8 MG/5ML SUER Take 5 mLs by mouth every 12 (twelve) hours as needed for cough. 10/13/15  Yes Reyne Dumas, MD  levothyroxine (SYNTHROID, LEVOTHROID) 112 MCG tablet Take 112 mcg by mouth daily before breakfast.   Yes Historical Provider, MD  magic mouthwash w/lidocaine SOLN Take 5 mLs by mouth 4 (four) times daily as needed for mouth pain. 03/13/15  Yes Velvet Bathe, MD  magnesium oxide (MAG-OX) 400 (241.3 Mg) MG tablet Take 1 tablet (400 mg total) by mouth 2 (two)  times daily. 10/13/15  Yes Reyne Dumas, MD  megestrol (MEGACE) 400 MG/10ML suspension Take 10 mLs (400 mg total) by mouth daily. 05/10/15  Yes Lavina Hamman, MD  ondansetron (ZOFRAN ODT) 8 MG disintegrating tablet Take 1 tablet (8 mg total) by mouth every 8 (eight) hours as needed for nausea or vomiting. 04/05/14  Yes Pattricia Boss, MD  Oxycodone HCl 20 MG TABS Take 1 tablet by mouth every hour as needed (pain).    Yes Historical Provider, MD  pantoprazole (PROTONIX) 40 MG tablet Take 1 tablet (40 mg total) by mouth 2 (two) times daily. 03/13/15  Yes Velvet Bathe, MD  polyethylene glycol (MIRALAX / GLYCOLAX) packet Take 17 g by mouth daily as needed for moderate constipation. Reported on 04/27/2015   Yes Historical Provider, MD  potassium chloride SA (K-DUR,KLOR-CON) 20 MEQ tablet Take 40 mEq by mouth 2 (two) times daily. Dissolve in water and take either in  applesauce or drink the water.   Yes Historical Provider, MD  predniSONE (DELTASONE) 20 MG tablet Take daily by mouth: 40 mg x3 days, then 20 mg x3 days, then 10 mg x3 days, then stop. 11/05/15  Yes Samuella Cota, MD  sertraline (ZOLOFT) 50 MG tablet Take 50 mg by mouth daily.   Yes Historical Provider, MD  Starch-Maltodextrin (THICK-IT PO) Take by mouth daily. Add to all liquid.   Yes Historical Provider, MD  tiotropium (SPIRIVA) 18 MCG inhalation capsule Place 18 mcg into inhaler and inhale daily.   Yes Historical Provider, MD    Physical Exam: Vitals:   11/10/15 1600 11/10/15 1700 11/10/15 1730 11/10/15 1800  BP: 93/67 100/71 125/83 97/76  Pulse:  80  89  Resp: '10 12 20 14  '$ Temp:      TempSrc:      SpO2:  91%  90%  Weight:      Height:       Constitutional:  . Appears calm and not in distress. Cachectic. Gargling sounds. Eyes:  . No pallor. No jaundice.  ENMT:  . external ears, nose appear normal Neck:  . Neck is supple. No JVD Respiratory:  . Wide spread rhonchi Cardiovascular:  . S1S2 . No LE extremity edema   Abdomen:  . Abdomen is soft and non tender. Organs are difficult to assess. Neurologic:  . Awake and alert. . Moves all limbs.  Wt Readings from Last 3 Encounters:  11/10/15 52.2 kg (115 lb)  11/02/15 52.2 kg (115 lb)  10/13/15 53.9 kg (118 lb 14.4 oz)    I have personally reviewed following labs and imaging studies  Labs on Admission:  CBC:  Recent Labs Lab 11/04/15 0500 11/05/15 0523 11/10/15 1309  WBC 11.5* 10.8* 20.1*  NEUTROABS  --   --  18.3*  HGB 12.6 11.6* 12.9  HCT 39.5 34.9* 40.6  MCV 81.8 81.4 81.2  PLT 258 264 676   Basic Metabolic Panel:  Recent Labs Lab 11/04/15 0500 11/05/15 0523 11/10/15 1309  NA 138 135 135  K 3.9 3.9 3.6  CL 107 108 109  CO2 24 21* 17*  GLUCOSE 160* 119* 94  BUN '15 16 16  '$ CREATININE 0.54 0.49 0.69  CALCIUM 8.8* 8.3* 7.9*   Liver Function Tests:  Recent Labs Lab 11/10/15 1309  AST 21    ALT 38  ALKPHOS 62  BILITOT 0.8  PROT 6.2*  ALBUMIN 2.7*   No results for input(s): LIPASE, AMYLASE in the last 168 hours. No results for input(s): AMMONIA in the  last 168 hours. Coagulation Profile: No results for input(s): INR, PROTIME in the last 168 hours. Cardiac Enzymes:  Recent Labs Lab 11/10/15 1309  TROPONINI 0.07*   BNP (last 3 results) No results for input(s): PROBNP in the last 8760 hours. HbA1C: No results for input(s): HGBA1C in the last 72 hours. CBG: No results for input(s): GLUCAP in the last 168 hours. Lipid Profile: No results for input(s): CHOL, HDL, LDLCALC, TRIG, CHOLHDL, LDLDIRECT in the last 72 hours. Thyroid Function Tests: No results for input(s): TSH, T4TOTAL, FREET4, T3FREE, THYROIDAB in the last 72 hours. Anemia Panel: No results for input(s): VITAMINB12, FOLATE, FERRITIN, TIBC, IRON, RETICCTPCT in the last 72 hours. Urine analysis:    Component Value Date/Time   COLORURINE YELLOW 10/09/2015 1710   APPEARANCEUR CLEAR 10/09/2015 1710   LABSPEC 1.010 10/09/2015 1710   PHURINE 5.5 10/09/2015 1710   GLUCOSEU NEGATIVE 10/09/2015 1710   HGBUR NEGATIVE 10/09/2015 1710   BILIRUBINUR NEGATIVE 10/09/2015 1710   KETONESUR NEGATIVE 10/09/2015 1710   PROTEINUR NEGATIVE 10/09/2015 1710   UROBILINOGEN 0.2 07/28/2014 2004   NITRITE NEGATIVE 10/09/2015 1710   LEUKOCYTESUR SMALL (A) 10/09/2015 1710   Sepsis Labs: '@LABRCNTIP'$ (procalcitonin:4,lacticidven:4) ) Recent Results (from the past 240 hour(s))  Culture, blood (routine x 2)     Status: None   Collection Time: 11/03/15 12:32 AM  Result Value Ref Range Status   Specimen Description BLOOD LEFT ARM  Final   Special Requests   Final    BOTTLES DRAWN AEROBIC AND ANAEROBIC AEB=8CC ANA=6CC   Culture NO GROWTH 5 DAYS  Final   Report Status 11/08/2015 FINAL  Final  Culture, blood (routine x 2)     Status: None   Collection Time: 11/03/15 12:37 AM  Result Value Ref Range Status   Specimen Description  BLOOD RIGHT HAND  Final   Special Requests BOTTLES DRAWN AEROBIC ONLY 6CC  Final   Culture NO GROWTH 5 DAYS  Final   Report Status 11/08/2015 FINAL  Final      Radiological Exams on Admission: Ct Head Wo Contrast  Result Date: 11/10/2015 CLINICAL DATA:  60 year old female with altered mental status. 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 Subsequent encounter. EXAM: CT HEAD WITHOUT CONTRAST TECHNIQUE: Contiguous axial images were obtained from the base of the skull through the vertex without intravenous contrast. COMPARISON:  07/21/2015 MR. 06/18/2015 CT. FINDINGS: Global atrophy.  No hydrocephalus. Prior left posterior frontal -parietal craniotomy with encephalomalacia below the craniotomy site similar to prior exam. Hypodensity left cerebellum may represent streak artifact or result of tumor. Patient was noted to have metastatic lesions in the cerebellum on recent MR. Prominent white matter changes may indicate result of treatment of tumor without CT evidence of large acute infarct. No intracranial hemorrhage. Scattered minimal calvarial lucencies are stable. Visualized mastoid air cells, middle ear cavities and paranasal sinuses are clear. No acute orbital abnormality. IMPRESSION: Global atrophy. Prior left posterior frontal -parietal craniotomy with encephalomalacia below the craniotomy site similar to prior exam. Hypodensity left cerebellum may represent streak artifact or result of tumor. Patient was noted to have metastatic lesions in the cerebellum on recent MR. Prominent white matter changes may indicate result of treatment of tumor. No intracranial hemorrhage or CT evidence of large acute infarct. Electronically Signed   By: Genia Del M.D.   On: 11/10/2015 16:30   Ct Chest W Contrast  Result Date: 11/10/2015 CLINICAL DATA:  Shortness of breath EXAM: CT CHEST WITH CONTRAST  TECHNIQUE: Multidetector CT imaging of the chest was  performed during intravenous contrast administration. CONTRAST:  69m ISOVUE-300 IOPAMIDOL (ISOVUE-300) INJECTION 61% COMPARISON:  10/09/2015 FINDINGS: Cardiovascular: Heart size is at upper normal. Tiny pericardial effusion persists. Coronary artery calcification is noted. No evidence for dissection of the thoracic aorta. No thoracic aortic aneurysm. Bolus timing is suboptimal for assessment of pulmonary embolism. Despite the poor opacification of the pulmonary arteries, there is sufficient contrast to exclude large central pulmonary embolus in the main pulmonary arteries or lobar pulmonary arteries. Mediastinum/Nodes: No mediastinal lymphadenopathy. 12 mm short axis left hilar lymph node measured previously is unchanged. 10 mm short axis right hilar lymph node measured on the previous study is now 9 mm. Lungs/Pleura: Moderate to advanced changes of emphysema are noted bilaterally. There has been interval development of relatively diffuse bilateral interstitial and central parenchymal nodularity, having a tree in bud/peribronchovascular distribution in some areas and not so much in others. Scattered areas of ill-defined airspace consolidation are seen in the periphery of the upper lobes bilaterally. 11 mm pulmonary nodule seen in the posterior right lower lobe on the previous study measures 8 mm today. Upper Abdomen: Calcified lymphadenopathy in the gastrohepatic ligament, hepatoduodenal ligament, retrocrural space, and upper retroperitoneum shows no substantial interval change. 3 the ring-enhancing lesions are identified within the visualized liver measuring up to 17 mm (image 142 series 2). Musculoskeletal: Bone windows reveal no worrisome lytic or sclerotic osseous lesions. IMPRESSION: 1. Interval progression of the interstitial disease and peribronchovascular ground-glass opacities and nodularity, concerning for continued progression of lymphangitic tumor spread. 2. Interval development of rim enhancing  necrotic lesions in the liver parenchyma, consistent with new hepatic metastases. 3. No substantial change in the mineralized mediastinal, retrocrural, and upper abdominal lymphadenopathy in this patient with a history of metastatic thyroid cancer. 4. Moderate changes of emphysema again noted. 5. Abdominal aortic atherosclerosis. Electronically Signed   By: EMisty StanleyM.D.   On: 11/10/2015 16:32   Dg Chest Port 1 View  Result Date: 11/10/2015 CLINICAL DATA:  Fever with shortness of breath. EXAM: PORTABLE CHEST 1 VIEW COMPARISON:  11/02/2015 FINDINGS: Slight improvement aeration at the right lung base with persistent right mid lung airspace opacity. Interstitial markings are diffusely coarsened with chronic features. The cardio pericardial silhouette is enlarged. The visualized bony structures of the thorax are intact. Telemetry leads overlie the chest. IMPRESSION: Slight interval improvement in aeration at the right base. Otherwise stable. Electronically Signed   By: EMisty StanleyM.D.   On: 11/10/2015 12:53    Active Problems:   Metastatic cancer ([place]   Failure to thrive (0-17)   Assessment/Plan 1. Failure to thrive 2. Metastatic cancer 3. Leukocytosis 4. Abnormal CT Chest  5. Aspiration risk 6. Moderate malnutrition   Admit patient  Pursue Hospice/Palliative care input  Antibiotics until goal of care is clear (anticipate comfort directed measures)  Oral atropine for secretions  Adequate pain control  Poor prognosis  DVT prophylaxis: [place] Lovenox Code Status: DNR Family Communication: Husband and 2 daughters Disposition Plan: Likely Home Hospice (There are indications patient would prefer to pass on at home)  Consults called: Consult palliative/Hospice team in am. The Family may request for the Pulmonologist, Dr. HLuan Pullingto be involved in co ordination of care   Admission status: Inpatient    Time spent: Greater than 60 minutes  SDana Allan MD  [facility] Pager #: [phone]-7AM contact night coverage as above   11/10/2015, 7:25 PM

## 2015-11-10 NOTE — ED Triage Notes (Signed)
Pt with recent hospitalization. Brought in today by family for AMS and cough. Pt is cancer pt.

## 2015-11-10 NOTE — ED Notes (Signed)
Lab and xray at the bedside

## 2015-11-11 ENCOUNTER — Encounter (HOSPITAL_COMMUNITY): Payer: Self-pay | Admitting: Primary Care

## 2015-11-11 ENCOUNTER — Ambulatory Visit: Payer: Self-pay | Admitting: Radiation Oncology

## 2015-11-11 DIAGNOSIS — Z7189 Other specified counseling: Secondary | ICD-10-CM

## 2015-11-11 DIAGNOSIS — Z515 Encounter for palliative care: Secondary | ICD-10-CM

## 2015-11-11 LAB — URINALYSIS, ROUTINE W REFLEX MICROSCOPIC
Bilirubin Urine: NEGATIVE
Glucose, UA: NEGATIVE mg/dL
Hgb urine dipstick: NEGATIVE
Ketones, ur: NEGATIVE mg/dL
Leukocytes, UA: NEGATIVE
Nitrite: NEGATIVE
Protein, ur: NEGATIVE mg/dL
Specific Gravity, Urine: 1.015 (ref 1.005–1.030)
pH: 6.5 (ref 5.0–8.0)

## 2015-11-11 LAB — STREP PNEUMONIAE URINARY ANTIGEN: Strep Pneumo Urinary Antigen: NEGATIVE

## 2015-11-11 MED ORDER — MORPHINE SULFATE (CONCENTRATE) 10 MG/0.5ML PO SOLN: 5.0000 mg | ORAL | Status: DC | PRN

## 2015-11-11 MED ORDER — BISACODYL 10 MG RE SUPP: 10.0000 mg | Freq: Every day | RECTAL | Status: DC | PRN

## 2015-11-11 MED ORDER — SENNOSIDES-DOCUSATE SODIUM 8.6-50 MG PO TABS
2.0000 | ORAL_TABLET | Freq: Two times a day (BID) | ORAL | Status: DC
  Administered 2015-11-12 – 2015-11-13 (×3): 2 via ORAL
  Filled 2015-11-11 (×3): qty 2

## 2015-11-11 MED ORDER — METHYLPREDNISOLONE SODIUM SUCC 40 MG IJ SOLR
40.0000 mg | Freq: Two times a day (BID) | INTRAMUSCULAR | Status: DC
  Administered 2015-11-11 – 2015-11-13 (×5): 40 mg via INTRAVENOUS
  Filled 2015-11-11 (×5): qty 1

## 2015-11-11 MED ORDER — ATROPINE SULFATE 1 % OP SOLN
1.0000 [drp] | OPHTHALMIC | Status: DC | PRN
  Administered 2015-11-11: 1 [drp] via SUBLINGUAL
  Administered 2015-11-11: 2 [drp] via SUBLINGUAL
  Filled 2015-11-11: qty 2

## 2015-11-11 MED ORDER — VANCOMYCIN HCL IN DEXTROSE 750-5 MG/150ML-% IV SOLN
INTRAVENOUS | Status: AC
  Filled 2015-11-11: qty 150

## 2015-11-11 MED ORDER — DEXTROSE 5 % IV SOLN
1.0000 g | Freq: Two times a day (BID) | INTRAVENOUS | Status: DC
  Administered 2015-11-11 – 2015-11-13 (×5): 1 g via INTRAVENOUS
  Filled 2015-11-11 (×7): qty 1

## 2015-11-11 MED ORDER — LORAZEPAM 0.5 MG PO TABS
0.5000 mg | ORAL_TABLET | Freq: Two times a day (BID) | ORAL | Status: DC | PRN
  Administered 2015-11-12: 0.5 mg via ORAL
  Filled 2015-11-11: qty 1

## 2015-11-11 MED ORDER — MORPHINE SULFATE (CONCENTRATE) 10 MG/0.5ML PO SOLN
10.0000 mg | ORAL | Status: DC
  Administered 2015-11-11 – 2015-11-13 (×12): 10 mg via ORAL
  Filled 2015-11-11 (×12): qty 0.5

## 2015-11-11 NOTE — Care Management (Signed)
Patient Information   Patient Name Haley Lowe, Haley Lowe ([id number]) Sex Female DOB [birth year]  Room Bed  A303 A303-01  Patient Demographics   Address [address] [place] [address] Phone [phone] (Home) *Preferred* [phone] (Mobile) E-mail Address [email or web address]  Patient Ethnicity & Race   Ethnic Group Patient Race  Not Hispanic or Latino White or Caucasian  Emergency Contact(s)   Name Relation Home Work Mobile  Rich,Sherry Daughter [phone]  Falkenstein,Dean Spouse [phone]  [phone]  Potts,Faye Daughter [phone]  [phone]  Documents on File    Status Date Received Description  Documents for the Patient  EMR Patient Summary Not Received    [place] Not Received    [place] E-Signature HIPAA Notice of Privacy Signed 09/09/11   [place] E-Signature HIPAA Notice of Privacy Spanish Not Received    Driver's License Received 71/24/58   Advance Directives/Living Will/HCPOA/POA Not Received    Insurance Card Received 09/13/11 bcbs  Financial Application Not Received    Driver's License Not Received    HIM ROI Authorization Not Received    Release of Information Not Received    Release of Information Received 09/28/11 [facility]  AMB Correspondence Not Received  06/13 neuro [facility]  AMB Correspondence Not Received  06/13 Dillon Bjork  AMB New Patient Records/Historical Not Received  06/13 referral Sherwood Gambler MD  [place] OF PRIVACY - Scanned Received 10/01/11 GIM  Insurance Card Received 10/01/11 GIM  Radiation Treatment Plan Not Received    Insurance Card Not Received    Insurance Card Received 01/24/12 BCBS  Release of Information Not Received    HIM ROI Authorization Not Received (Expired)    Release of Information Not Received    HIM ROI Authorization Not Received    Release of Information Not Received    AMB Patient Logs/Info Not Received    AMB  Correspondence Not Received  08/13 neuro [facility] Route Bra  HIM ROI Authorization Not Received (Expired)  PA`TS  AMB Correspondence Not Received  08/13 [facility]  AMB Correspondence Not Received  09/13 neuro [facility]' \\T'$ \  HIM ROI Authorization Not Received (Expired)    Release of Information Not Received    AMB Correspondence Not Received  10/13 auth BCBSNC  Release of Information Not Received    Driver's License Received 09/98/33   Driver's License Received 82/50/53   AMB Correspondence Not Received  09/13 Recall Assess Olevia Perches MD  AMB Correspondence Not Received  01/14 Order BCBS of [place] Card Received 97/67/34 GIM  VVS Policy for Pain - E Signature Not Received    AMB Correspondence Not Received  04/14 Order [employer] Card Received 07/21/15 315  Insurance Card Not Received    AMB Correspondence Not Received  01/14 neuro [facility]  AMB Correspondence Not Received  04/14 Rx [facility]  AMB Correspondence Not Received  referral Fusco MD, L  AMB Correspondence Not Received  07/14 AUTH BCBS  AMB Correspondence Not Received  07/14 office note Sherwood Gambler MD,  AMB Correspondence Not Received  10/14 Josem Kaufmann BCBS  Driver's License Received 19/37/90 GIM  AMB Correspondence  05/16/13 02/15 [employer]  Insurance Card Received 05/18/13 GIM  [place] HIPAA NOTICE OF PRIVACY - Scanned Received 05/18/13 GIM  AMB Correspondence  08/08/13 05/15 ORDER BLUECROSS [place] Card Received 08/17/13 GIM INS AND ID  Insurance Card Received 09/27/13 gi-wmc   HIPAA NOTICE  OF PRIVACY - Scanned Received 09/27/13 gi-wmc  [employer] Card Received 12/18/13 blue medicare/kf/vvs  AMB Correspondence  12/21/13 ORDER REQUEST SUMMARY [place] Card Received 12/28/13 GIM ID / INSURANCE CARD  HIM ROI Authorization (Expired) 02/19/14 BCBSNC 'S Commercial Risk Adjustment Review.  Need [place] records from 04/05/2013 to present.  AMB Correspondence  01/03/14 OFFICE NOTE [facility]  Insurance Card     AMB Correspondence  02/01/14 LETTER/OFFICE NOTES  [facility]  Insurance Card Received 04/10/14   HIM ROI Authorization  04/16/14   Insurance Card Received 05/04/14 bcbs 2016  Release of Information  05/13/14   Insurance Card Received 05/15/14 GIM INS AND ID  [place] HIPAA NOTICE OF PRIVACY - Scanned Received 05/15/14 GIM HIPPA  AMB Correspondence  05/13/14 LETTER BLUECROSS BLUESHIELD [place]  AMB Correspondence  05/14/14 OFFICE NOTE [facility] [place]  Advanced Beneficiary Notice (ABN) Not Received    E-Signature AOB Spanish Not Received    Other Photo ID Not Received    AMB Correspondence  08/19/14 ORDER REQUES SUMMARY BCBS OF [place]  AMB Outside Hospital Record  09/20/14 [facility] Record  10/22/14 PROGRESS NOTES [facility]  Insurance Card Received 12/06/14 BLUE MC  AMB Correspondence  10/22/14 REFERRAL [place].  [facility] Record  01/05/15 07/16-08/16 HEMATOLOGY ONCOLOGY [facility]  AMB Correspondence  01/22/15 AUTHORIZATION BCBS OF [place]  AMB Correspondence  06/24/15   Release of Information   DPR [facility] 2017  AMB Correspondence  07/07/15 MOCA [facility]  AMB Correspondence  07/22/15 OFFICE NOTE [facility] [place]  HIM ROI Authorization  09/03/15 BOARD OF NURSING  HIM ROI Authorization (Expired) 10/06/15 Authorization for batch [place]/Blue [employer] Shield Medicare Risk Adjustment fbg 10/06/2015  AMB Correspondence  10/30/15 ORDER REQUEST SUMMARY BCBSNC  AMB Correspondence (Deleted) 09/28/11   AMB Correspondence (Deleted) 09/30/11   AMB New Patient Records/Historical (Deleted) 10/01/11   AMB Correspondence (Deleted) 11/16/11   AMB Correspondence (Deleted) 11/29/11   AMB Correspondence (Deleted) 12/16/11   AMB Correspondence (Deleted) 01/15/12   AMB Correspondence  (Deleted) 04/21/12   AMB Correspondence (Deleted) 07/13/12   AMB Correspondence (Deleted) 10/11/12   AMB Correspondence (Deleted) 11/06/12   AMB Correspondence (Deleted) 01/09/13   AMB Correspondence (Deleted) 05/16/13   AMB Correspondence (Deleted) 05/13/14 SERVICE/PROCEDURE APPROVAL BLUECROSS BLUESHIELD [place]  AMB Correspondence (Deleted) 05/14/14 OFFICE NOTE [facility] Richardean Sale, J MD  AMB Outside Consult Note (Deleted) 09/20/14 [facility] Record (Deleted) 01/05/15 07/16-08/16 [facility]  AMB Outside Hospital Record (Deleted) 01/05/15 07/16-08/16 [facility]  Documents for the Encounter  AOB (Assignment of Insurance Benefits) Not Received    E-signature AOB Signed 11/10/15   MEDICARE RIGHTS Not Received    E-signature Medicare Rights Signed 11/10/15   ED Patient Billing Extract   ED PB Summary  ED Patient Billing Extract   ED Encounter Summary  Cardiac Monitoring Strip  11/10/15   EKG  11/11/15   Admission Information   Attending Provider Admitting Provider Admission Type Admission Date/Time  Orvan Falconer, MD Bonnell Public, MD Emergency 11/10/15 1207  Discharge Date Hospital Service Auth/Cert Status Service Area   Internal Medicine Incomplete [place]  Unit Room/Bed Admission Status   AP-DEPT 300 A303/A303-01 Admission (Confirmed)   Admission   Complaint  [facility] [facility] Account   Name Acct ID Class Status Primary Coverage  Haley Lowe, Haley Lowe [id number] Inpatient Open BLUE CROSS BLUE SHIELD MEDICARE - BCBS MEDICARE      Guarantor Account (for Hospital Account [id number])   Name Relation to Pt Service Area Active? Acct Type  Haley Lowe Self CHSA Yes Personal/Family  Address Phone    [address] [place], [place] [address] [phone])        Coverage Information (for Hospital Account [id number])   F/O Payor/Plan Precert #  [facility] SHIELD MEDICARE/BCBS MEDICARE   Subscriber Subscriber #  Haley Lowe, Haley Lowe  [id number]  Address Phone  [address] [place], [place] [address]

## 2015-11-11 NOTE — Progress Notes (Signed)
Pt unable to void, Bladder scan showing 350cc in bladder. Discussed with pt and husband and they requested foley catheter to be placed for comfort. #14 Fr. Foley catheter placed using sterile technique. Yellow urine returned.

## 2015-11-11 NOTE — Progress Notes (Signed)
Pt unable to void. Bladder scan showed greater than 500cc. I&O cath performed with output of 650cc of amber urine.

## 2015-11-11 NOTE — Consult Note (Signed)
Consultation Note Date: 11/11/2015   Patient Name: Haley Lowe  DOB: [birth year]  MRN: [id number]  Age / Sex: 60 y.o., female  PCP: Redmond School, MD Referring Physician: Orvan Falconer, MD  Reason for Consultation: Disposition, Establishing goals of care, Hospice Evaluation, Non pain symptom management, Pain control and Psychosocial/spiritual support  HPI/Patient Profile: 60 y.o. 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 admitted on 11/10/2015 with aspiration pneumonia.   Clinical Assessment and Goals of Care: Haley Lowe is resting quietly in bed, daughter Haley Lowe is at bedside. She is able to communicate with me, most of her basic needs. She shares that she is having some pain, this is related to pain at her sacrum from pressure. Daughter Haley Lowe and I talk outside the room and plan a meeting with Haley Lowe husband Haley Lowe and her other daughter Haley Lowe. We also talk about pain and symptom management and I write orders for morphine liquid.  Husband Haley Lowe, daughters Haley Lowe and Haley Lowe meet with me in my office. We talk about [name]'s health history and current health problems. We talk about pain management, they 1st feel the current regimen is effective so far. We talk about unburdening her from medications that do not assist with comfort and dignity, and I implement orders based on family's wishes. They share that they are understanding that when she returns home with hospice of [facility], the focus will be on comfort and not cure. We discussed the use of antibiotics at home, and also the use of dexamethasone to assist with pain management and swelling. Haley Lowe states that [name] has been on a steroid for a long time now.  We talk about  comfort and dignity and the use of a Foley catheter if needed, we talk about the signs and symptoms as we come closer to death. I encourage daughters to stay overnight with Haley Lowe.    Healthcare power of attorney HCPOA- husband Talaya Lamprecht   SUMMARY OF RECOMMENDATIONS   home with hospice of [facility], focus on comfort and dignity. We discussed unburdening [name] from medications that are not focused on comfort and dignity. I encourage family to listen to their heart, keeping [name] in the center of their decision-making.  Code Status/Advance Care Planning:  DNR  Symptom Management:   morphine 10 mg PO/SL Q4 hours scheduled  morphine 5 mg PO/SL Q 30 minutes PRN  will continue to adjust dosing for optimal pain relief  Palliative Prophylaxis:   Bowel Regimen, Frequent Pain Assessment, Oral Care, Palliative Wound Care and Turn Reposition  Additional Recommendations (Limitations, Scope, Preferences):  Home with hospice of [facility], continue some medications for comfort and dignity.  Psycho-social/Spiritual:   Desire for further Chaplaincy support:no  Additional Recommendations: Caregiving  Support/Resources and Education on Hospice  Prognosis:   < 2 weeks, likely based  on recurrent aspirations, and families desire to focus on comfort.  Discharge Planning: Home with hospice of [facility]      Primary Diagnoses: Present on Admission: . Metastatic cancer ([place]) . Failure to thrive (0-17)   I have reviewed the medical record, interviewed the patient and family, and examined the patient. The following aspects are pertinent.  Past Medical History:  Diagnosis Date  . Anxiety   . Arthritis   . Borderline diabetes mellitus   . Cataract    1 lens implaqnt right eye,intact on left eye  . Controlled diabetes mellitus type II without complication ([place]) 73/71/0626   Previously diagnosed with borderline diabetes.  Marland Kitchen COPD (chronic  obstructive pulmonary disease) ([place])   . Depression   . DVT (deep venous thrombosis) ([place]) 12/09/2011  . Emphysema   . Essential hypertension   . Fibromyalgia   . GERD (gastroesophageal reflux disease)   . Goiter   . HCAP (healthcare-associated pneumonia) 03/03/2015  . Headache(784.0)   . 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 History of radiation therapy 10/06/11   SRS 15Gy 78f, brain  . Hx of adenomatous colonic polyps   . Hyperlipidemia   . Lung nodule seen on imaging study 11/04/11 CT   484mLLL  . Metastatic adenocarcinoma to brain ([place]6/12/13   Left frontoparietal region  . Mucositis due to radiation therapy 03/03/2015  . PONV (postoperative nausea and vomiting)   . Pulmonary emboli ([facility]8/1/13   Right upper lobe and r lower lobe PE  . Pulmonary embolism (HCDISH9/08/2011  . S/P radiation therapy 05/31/14 SRS   Brain  . Thyroid cancer (HCTye2015  . UTI (lower urinary tract infection)    Social History   Social History  . Marital status: Married    Spouse name: N/A  . Number of children: 4  . Years of education: N/A   Occupational History  . disabled     prev Time WaSuzan Slick Social History Main Topics  . Smoking status: Former Smoker    Packs/day: 1.00    Years: 40.00    Types: Cigarettes    Quit date: 04/06/2011  . Smokeless tobacco: Never Used  . Alcohol use No  . Drug use: No  . Sexual activity: Yes   Other Topics Concern  . None   Social History Narrative   Married, unemployed      Family History  Problem Relation Age of Onset  . Asthma Mother   . Kidney failure Father   . Diabetes Sister   . Heart attack Sister   . COPD Sister   . Colon cancer Brother   . Aneurysm Paternal Grandmother     Brain  . Parkinsonism Maternal Uncle   . COPD Brother    Scheduled Meds: . budesonide (PULMICORT) nebulizer solution  0.25 mg Nebulization BID  . ceFEPime (MAXIPIME) IV  1 g Intravenous Q12H  . glycopyrrolate  0.1 mg Intravenous  Once  . ipratropium-albuterol  3 mL Nebulization Q6H  . levothyroxine  112 mcg Oral QAC breakfast  . magnesium oxide  400 mg Oral BID  . megestrol  400 mg Oral Daily  . methylPREDNISolone (SOLU-MEDROL) injection  40 mg Intravenous Q12H  . morphine CONCENTRATE  10 mg Oral Q4H  . pantoprazole  40 mg Oral BID  . predniSONE  10 mg Oral Q breakfast  . RESOURCE THICKENUP CLEAR   Oral Daily  . senna-docusate  2 tablet Oral BID  . sertraline  50 mg Oral Daily   Continuous Infusions:  PRN Meds:.acetaminophen, albuterol, atropine, bisacodyl, chlorpheniramine-HYDROcodone, magic mouthwash **OR** lidocaine, morphine CONCENTRATE Medications Prior to Admission:  Prior to Admission medications   Medication Sig Start Date End Date Taking? Authorizing Provider  acetaminophen (TYLENOL) 325 MG tablet Take 2 tablets (650 mg total) by mouth every 6 (six) hours as needed for mild pain. 05/10/15  Yes Lavina Hamman, MD  albuterol (PROVENTIL HFA;VENTOLIN HFA) 108 (90 Base) MCG/ACT inhaler Inhale 2 puffs into the lungs every 6 (six) hours as needed for wheezing or shortness of breath. 10/13/15  Yes Reyne Dumas, MD  albuterol (PROVENTIL) (2.5 MG/3ML) 0.083% nebulizer solution Take 3 mLs (2.5 mg total) by nebulization every 4 (four) hours as needed for wheezing or shortness of breath. 04/25/15  Yes Orvan Falconer, MD  budesonide-formoterol [facility]) 160-4.5 MCG/ACT inhaler Inhale 2 puffs into the lungs 2 (two) times daily.   Yes Historical Provider, MD  cefUROXime (CEFTIN) 500 MG tablet Take 500 mg by mouth 2 (two) times daily with a meal.   Yes Historical Provider, MD  chlorpheniramine-HYDROcodone (TUSSIONEX PENNKINETIC ER) 10-8 MG/5ML SUER Take 5 mLs by mouth every 12 (twelve) hours as needed for cough. 10/13/15  Yes Reyne Dumas, MD  levothyroxine (SYNTHROID, LEVOTHROID) 112 MCG tablet Take 112 mcg by mouth daily before breakfast.   Yes Historical Provider, MD  magic mouthwash w/lidocaine SOLN Take 5 mLs by mouth 4 (four)  times daily as needed for mouth pain. 03/13/15  Yes Velvet Bathe, MD  magnesium oxide (MAG-OX) 400 (241.3 Mg) MG tablet Take 1 tablet (400 mg total) by mouth 2 (two) times daily. 10/13/15  Yes Reyne Dumas, MD  megestrol (MEGACE) 400 MG/10ML suspension Take 10 mLs (400 mg total) by mouth daily. 05/10/15  Yes Lavina Hamman, MD  ondansetron (ZOFRAN ODT) 8 MG disintegrating tablet Take 1 tablet (8 mg total) by mouth every 8 (eight) hours as needed for nausea or vomiting. 04/05/14  Yes Pattricia Boss, MD  Oxycodone HCl 20 MG TABS Take 1 tablet by mouth every hour as needed (pain).    Yes Historical Provider, MD  pantoprazole (PROTONIX) 40 MG tablet Take 1 tablet (40 mg total) by mouth 2 (two) times daily. 03/13/15  Yes Velvet Bathe, MD  polyethylene glycol (MIRALAX / GLYCOLAX) packet Take 17 g by mouth daily as needed for moderate constipation. Reported on 04/27/2015   Yes Historical Provider, MD  potassium chloride Lowe (K-DUR,KLOR-CON) 20 MEQ tablet Take 40 mEq by mouth 2 (two) times daily. Dissolve in water and take either in applesauce or drink the water.   Yes Historical Provider, MD  predniSONE (DELTASONE) 20 MG tablet Take daily by mouth: 40 mg x3 days, then 20 mg x3 days, then 10 mg x3 days, then stop. 11/05/15  Yes Samuella Cota, MD  sertraline (ZOLOFT) 50 MG tablet Take 50 mg by mouth daily.   Yes Historical Provider, MD  Starch-Maltodextrin (THICK-IT PO) Take by mouth daily. Add to all liquid.   Yes Historical Provider, MD  tiotropium (SPIRIVA) 18 MCG inhalation capsule Place 18 mcg into inhaler and inhale daily.   Yes Historical Provider, MD   Allergies  Allergen Reactions  . Penicillins Swelling    " my brain swelled, and mymouth" Has patient had a PCN reaction causing immediate rash, facial/tongue/throat swelling, SOB or lightheadedness with hypotension: Yes Has patient had a PCN reaction causing severe rash involving mucus membranes or skin necrosis: No Has patient had a PCN reaction that  required  hospitalization No Has patient had a PCN reaction occurring within the last 10 years: No If all of the above answers are "NO", then may proceed with Cephalosporin use.   . Other Swelling    PT ALSO REACTS TO PAPER TAPE AND BAND-AIDS.  Marland Kitchen Codeine Itching    Mild takes benadryl  . Tape Rash   Review of Systems  Unable to perform ROS: Acuity of condition    Physical Exam  Constitutional: No distress.  Frail and thin, chronically ill appearing  HENT:  Head: Normocephalic and atraumatic.  Cardiovascular: Normal rate.   Pulmonary/Chest: Effort normal.  Audible rhonchi, copious secretions, unable to clear  Abdominal: Soft. She exhibits no distension.  Musculoskeletal:  Muscle wasting  Skin: Skin is warm and dry.  Nursing note and vitals reviewed.   Vital Signs: BP 105/75 (BP Location: Left Arm)   Pulse 91   Temp 97.8 F (36.6 C) (Oral)   Resp 20   Ht '5\' 3"'$  (1.6 m)   Wt 49.6 kg (109 lb 6.4 oz)   SpO2 96%   BMI 19.38 kg/m  Pain Assessment: No/denies pain       SpO2: SpO2: 96 % O2 Device:SpO2: 96 % O2 Flow Rate: .O2 Flow Rate (L/min): 4 L/min  IO: Intake/output summary:  Intake/Output Summary (Last 24 hours) at 11/11/15 1310 Last data filed at 11/11/15 0631  Gross per 24 hour  Intake             1250 ml  Output              650 ml  Net              600 ml    LBM: Last BM Date: 11/09/15 Baseline Weight: Weight: 52.2 kg (115 lb) Most recent weight: Weight: 49.6 kg (109 lb 6.4 oz)     Palliative Assessment/Data:   Flowsheet Rows   Flowsheet Row Most Recent Value  Intake Tab  Referral Department  Hospitalist  Unit at Time of Referral  Med/Surg Unit  Palliative Care Primary Diagnosis  Pulmonary  Date Notified  11/10/15  Palliative Care Type  New Palliative care  Reason for referral  Clarify Goals of Care, End of Life Care Assistance  Date of Admission  11/10/15  Date first seen by Palliative Care  11/11/15  # of days Palliative referral response time   1 Day(s)  # of days IP prior to Palliative referral  0  Clinical Assessment  Palliative Performance Scale Score  30%  Pain Max last 24 hours  Not able to report  Pain Min Last 24 hours  Not able to report  Dyspnea Max Last 24 Hours  Not able to report  Dyspnea Min Last 24 hours  Not able to report  Psychosocial & Spiritual Assessment  Palliative Care Outcomes  Patient/Family meeting held?  Yes  Who was at the meeting?  daughter Haley Lowe, husband Scientist, physiological  Palliative Care Outcomes  Improved pain interventions, Provided advance care planning, Transitioned to hospice, Provided end of life care assistance, Provided psychosocial or spiritual support  Patient/Family wishes: Interventions discontinued/not started   Mechanical Ventilation  Palliative Care follow-up planned  -- [follow up while at [facility]]      Time In: 1120- 1330 Time Out: 1135 - 1410 Time Total:  55 minutes   Greater than 50%  of this time was spent counseling and coordinating care related to the above assessment and plan.  Signed by: Drue Novel, NP   Please contact  Palliative Medicine Team phone at [phone] for questions and concerns.  For individual provider: See Shea Evans

## 2015-11-11 NOTE — Care Management Note (Signed)
Case Management Note  Patient Details  Name: Haley Lowe MRN: [id number] Date of Birth: 1955-11-05  Subjective/Objective:                  Pt from home and active with [facility]. Pt has home O2 through [employer]. Pt's husband and daughters at the bedside to provide support. They have decided to take pt home with hospice services in the home. [facility] has chosen [facility] from list of hospice providers. Referral has been faxed and family contacted. Palliative NP has also been consulted and will see pt today. [facility] updated on DC plan. No DME needed at the time of DC. Family does not want hospital bed, they have an adjustable bed at home they feel she would be more comfortable in.   Action/Plan: Will cont to follow. Anticipate DC home tomorrow.   Expected Discharge Date:    11/12/2015              Expected Discharge Plan:  Home w Hospice Care  In-House Referral:  Hospice / Palliative Care  Discharge planning Services  CM Consult  Post Acute Care Choice:  Hospice Choice offered to:  Spouse, Adult Children  DME Arranged:    DME Agency:     HH Arranged:    [place] Agency:  [facility]  Status of Service:  In process, will continue to follow  If discussed at Long Length of Stay Meetings, dates discussed:    Additional Comments:  Sherald Barge, RN 11/11/2015, 2:03 PM

## 2015-11-11 NOTE — Evaluation (Signed)
Clinical/Bedside Swallow Evaluation Haley Lowe Details  Name: Haley Lowe MRN: [id number] Date of Birth: 09-17-1955  Today's Date: 11/11/2015 Time: SLP Start Time (ACUTE ONLY): 71 SLP Stop Time (ACUTE ONLY): 1416 SLP Time Calculation (min) (ACUTE ONLY): 14 min  Past Medical History:  Past Medical History:  Diagnosis Date  . Anxiety   . Arthritis   . Borderline diabetes mellitus   . Cataract    1 lens implaqnt right eye,intact on left eye  . Controlled diabetes mellitus type II without complication ([place]) 03/00/9233   Previously diagnosed with borderline diabetes.  Marland Kitchen COPD (chronic obstructive pulmonary disease) ([place])   . Depression   . DVT (deep venous thrombosis) ([place]) 12/09/2011  . Emphysema   . Essential hypertension   . Fibromyalgia   . GERD (gastroesophageal reflux disease)   . Goiter   . HCAP (healthcare-associated pneumonia) 03/03/2015  . Headache(784.0)   . 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 History of radiation therapy 10/06/11   SRS 15Gy 10f, brain  . Hx of adenomatous colonic polyps   . Hyperlipidemia   . Lung nodule seen on imaging study 11/04/11 CT   436mLLL  . Metastatic adenocarcinoma to brain ([facility]6/12/13   Left frontoparietal region  . Mucositis due to radiation therapy 03/03/2015  . PONV (postoperative nausea and vomiting)   . Pulmonary emboli ([facility]8/1/13   Right upper lobe and r lower lobe PE  . Pulmonary embolism ([facility]9/08/2011  . S/P radiation therapy 05/31/14 SRS   Brain  . Thyroid cancer ([facility]2015  . UTI (lower urinary tract infection)    Past Surgical History:  Past Surgical History:  Procedure Laterality Date  . ABDOMINAL HYSTERECTOMY  1996  . BIOPSY  03/07/2015   Procedure: BIOPSY;  Surgeon: RoDaneil DolinMD;  [facility];  Service: Endoscopy;;  . CATARACT EXTRACTION  2011   With lens implant  . CHOLECYSTECTOMY  2000  . CRANIOTOMY  09/15/2011   Procedure: CRANIOTOMY TUMOR EXCISION;  Surgeon:  RoHosie SpangleMD;  [facility];  Service: Neurosurgery;  Laterality: N/A;  Craniotomy resection of tumor with stealth  . ESOPHAGOGASTRODUODENOSCOPY (EGD) WITH PROPOFOL N/A 03/07/2015   Procedure: ESOPHAGOGASTRODUODENOSCOPY (EGD) WITH PROPOFOL;  Surgeon: RoDaneil DolinMD;  [facility];  Service: Endoscopy;  Laterality: N/A;  . EYE SURGERY  2001  . FOOT SURGERY Left 2005  . LYMPH GLAND EXCISION    . SHOULDER SURGERY Right 1998  . TOTAL [facility]OMY  [facility]-29-2013   [facility] V[facility] BRONCHOSCOPY WITH ENDOBRONCHIAL ULTRASOUND N/A 04/11/2014   Procedure: VIDEO BRONCHOSCOPY WITH ENDOBRONCHIAL ULTRASOUND;  Surgeon: StMelrose NakayamaMD;  [facility];  Service: Thoracic;  Laterality: N/A;   HPI:  [age]ear old female with metastatic cancer and recurrent aspiration and aspiration pneumonia, discharged from this hospital about 5 days ago after inpatient treatment for aspiration pneumonia. [facility] been able to follow up with the Oncologist in a while due to recurrent inpatient care for pneumonia. Haley Lowe is also known to the Pulmonologist, Dr. HaLuan PullingThe Haley Lowe could not give much history. Most of the history came from Haley Lowe's 2 daughters and husband. Haley Lowe presents with SOB, with Oxygen saturation in the 80's, and increased secretions. No fever or chills. Haley Lowe is also very weak. CT Chest done revealed continued progression of lymphangitic tumor spread and interval interval development of rim  enhancing necrotic lesions in the liver parenchyma, consistent with new hepatic metastases. Worsening leukocytosis is noted, but Haley Lowe is on oral prednisone. No fever or chills. No headache, no neck pain, no chest pain, no GI symptoms.    Assessment / Plan / Recommendation Clinical Impression  Pt with extensive hx of dysphagia. This date pt with significantly congested respiratory status,  wheezing present prior to PO trials on inspiration and expiration. RN reports pt refusal of deep suctioning. Note palliative consult initiated though goals of care pending. Pt with xerostomia despite oral care. Pt able to facilitate productive cough when cued volitionally though vocal quality remained wet with congested breath sounds. Given known dysphagia hx with recurrent aspiration PNA, decreased respiratory status, and pt deconditioning with immobility,  recommend continue NPO with medicines via alternative means. Pt okay for ice chips following oral care.  ST to follow for goals of care.    Aspiration Risk  Severe aspiration risk;Risk for inadequate nutrition/hydration    Diet Recommendation     Medication Administration: Via alternative means    Other  Recommendations Oral Care Recommendations: Oral care QID;Other (Comment) (oral care prior to ice chips )   Follow up Recommendations  Other (comment) (palliative consult pending )    Frequency and Duration min 1 x/week  1 week       Prognosis Prognosis for Safe Diet Advancement: Guarded Barriers to Reach Goals: Severity of deficits;Motivation      Swallow Study   General Date of Onset: 11/10/15 HPI: 60 year old female with metastatic cancer and recurrent aspiration and aspiration pneumonia, discharged from this hospital about 5 days ago after inpatient treatment for aspiration pneumonia. Haley Lowe is known to an Materials engineer [facility] but has not been able to follow up with the Oncologist in a while due to recurrent inpatient care for pneumonia. Haley Lowe is also known to the Pulmonologist, Dr. Luan Pulling. The Haley Lowe could not give much history. Most of the history came from Haley Lowe's 2 daughters and husband. Haley Lowe presents with SOB, with Oxygen saturation in the 80's, and increased secretions. No fever or chills. Haley Lowe is also very weak. CT Chest done revealed continued progression of lymphangitic tumor spread and interval  interval development of rim enhancing necrotic lesions in the liver parenchyma, consistent with new hepatic metastases. Worsening leukocytosis is noted, but Haley Lowe is on oral prednisone. No fever or chills. No headache, no neck pain, no chest pain, no GI symptoms.  Type of Study: Bedside Swallow Evaluation Previous Swallow Assessment: Pt has had numerous objective tests; FEES in May 2017 with rec for D2/NTL Diet Prior to this Study: NPO Temperature Spikes Noted: No Respiratory Status: Nasal cannula History of Recent Intubation: No Behavior/Cognition: Alert (flat affect ) Oral Cavity Assessment: Dry Oral Care Completed by SLP: Yes Oral Cavity - Dentition: Adequate natural dentition Self-Feeding Abilities: Needs assist Haley Lowe Positioning: Upright in bed Baseline Vocal Quality: Wet;Low vocal intensity Volitional Cough: Weak;Congested Volitional Swallow: Unable to elicit    Oral/Motor/Sensory Function Overall Oral Motor/Sensory Function: Generalized oral weakness   Ice Chips Ice chips: Impaired Presentation: Spoon Oral Phase Impairments: Reduced lingual movement/coordination Oral Phase Functional Implications: Prolonged oral transit Pharyngeal Phase Impairments: Suspected delayed Swallow;Multiple swallows;Wet Vocal Quality   Thin Liquid Thin Liquid: Not tested    Nectar Thick Nectar Thick Liquid: Not tested   Honey Thick Honey Thick Liquid: Not tested   Puree Puree: Impaired Oral Phase Impairments: Reduced lingual movement/coordination Oral Phase Functional Implications: Prolonged oral transit Pharyngeal Phase Impairments: Suspected delayed  Swallow;Multiple swallows;Wet Vocal Quality   Solid   GO   Solid: Not tested       Arvil Chaco MA, CCC-SLP Acute Care Speech Language Pathologist    Arvil Chaco E 11/11/2015,2:47 PM

## 2015-11-11 NOTE — Consult Note (Signed)
Haley Lowe, PICKERT                ACCOUNT NO.:  [id number]  MEDICAL RECORD NO.:  [id number]  LOCATION:  A303                          FACILITY:  [facility]  PHYSICIAN:  Lajuane Leatham L. Luan Pulling, M.D.DATE OF BIRTH:  March 05, 1956  DATE OF CONSULTATION: DATE OF DISCHARGE:                                CONSULTATION   Hospitalist patient.  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  She has to come back to the hospital with another episode of acute respiratory distress. She also chronically aspirates.  She has responded in the past to antibiotics when she has these episodes.  Her CT of the chest shows what looks like lymphangitic spread of her cancer and also now shows metastatic disease to the liver.  I discussed this with her family at bedside and told them that she is clearly dying, that she is appropriate for hospice, and that although we will try to treat the treatable including the respiratory problems, but that her major problem is not treatable.  They understand.  PHYSICAL EXAMINATION:  GENERAL:  Shows that she is gurgling.  She is poorly responsive. CHEST:  Shows rhonchi and wheezes bilaterally. HEART:  Regular. ABDOMEN:  Soft.  IMPRESSION:  She looks chronically ill.  I have reviewed her medications.  I reviewed her past medical history, which is dominated by the problems with her cancer and her chronic aspiration.  Family agrees to Palliative Care consultation, which I think is appropriate.  She has responded in the past to cephalosporin.  So, I am going to put her on cephalosporin, put her on steroids to try to see if we can improve her respiratory status.  Deep suction as needed.  Await Palliative Care consultation, and probably she could be discharged with hospice care once we have cleared her chest somewhat.  They state that their goal is to have her at home.     [name] L. Luan Pulling,  M.D.     ELH/MEDQ  D:  11/11/2015  T:  11/11/2015  Job:  [id number]

## 2015-11-11 NOTE — Progress Notes (Signed)
[facility] PROGRESS NOTE  Haley Lowe ZOX:[id number] DOB: 11/11/1955    PCP:   [name], MD   HPI:   Haley Lowe  is a 60 y.o. female, History of COPD, metastatic thyroid cancer who was just discharged from the hospital on 10/13/2015. In previous admission CT scan chest showed hilar lymphadenopathy causing extrinsic narrowing of bilateral central bronchi but his blood metastatic adenopathy, also lymphangitic tumor spread. Patient had refused palliative care consultation during the last hospitalization.  However, after consultation with Dr Standley Dakins, she is now agreeable and we will consult with Palliative Care Medicine for comfort care.   Patient is a poor historian, though denies chest pain, no nausea vomiting or diarrhea.  Rewiew of Systems:  Constitutional: Negative for malaise, fever and chills. No significant weight loss or weight gain Eyes: Negative for eye pain, redness and discharge, diplopia, visual changes, or flashes of light. ENMT: Negative for ear pain, hoarseness, nasal congestion, sinus pressure and sore throat. No headaches; tinnitus, drooling, or problem swallowing. Cardiovascular: Negative for chest pain, palpitations, diaphoresis, dyspnea and peripheral edema. ; No orthopnea, PND Respiratory: Negative for cough, hemoptysis, wheezing and stridor. No pleuritic chestpain. Gastrointestinal: Negative for nausea, vomiting, diarrhea, constipation, abdominal pain, melena, blood in stool, hematemesis, jaundice and rectal bleeding.    Genitourinary: Negative for frequency, dysuria, incontinence,flank pain and hematuria; Musculoskeletal: Negative for back pain and neck pain. Negative for swelling and trauma.;  Skin: . Negative for pruritus, rash, abrasions, bruising and skin lesion.; ulcerations Neuro: Negative for headache, lightheadedness and neck stiffness. Negative for weakness, altered level of consciousness , altered mental status, extremity weakness, burning feet,  involuntary movement, seizure and syncope.  Psych: negative for anxiety, depression, insomnia, tearfulness, panic attacks, hallucinations, paranoia, suicidal or homicidal ideation    Past Medical History:  Diagnosis Date  . Anxiety   . Arthritis   . Borderline diabetes mellitus   . Cataract    1 lens implaqnt right eye,intact on left eye  . Controlled diabetes mellitus type II without complication ([place]) 81/19/1478   Previously diagnosed with borderline diabetes.  Marland Kitchen COPD (chronic obstructive pulmonary disease) ([place])   . Depression   . DVT (deep venous thrombosis) ([place]) 12/09/2011  . Emphysema   . Essential hypertension   . Fibromyalgia   . GERD (gastroesophageal reflux disease)   . Goiter   . HCAP (healthcare-associated pneumonia) 03/03/2015  . Headache(784.0)   . 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 History of radiation therapy 10/06/11   SRS 15Gy 60f, brain  . Hx of adenomatous colonic polyps   . Hyperlipidemia   . Lung nodule seen on imaging study 11/04/11 CT   475mLLL  . Metastatic adenocarcinoma to brain ([facility]/12/13   Left frontoparietal region  . Mucositis due to radiation therapy 03/03/2015  . PONV (postoperative nausea and vomiting)   . Pulmonary emboli ([facility]/1/13   Right upper lobe and r lower lobe PE  . Pulmonary embolism ([facility]/08/2011  . S/P radiation therapy 05/31/14 SRS   Brain  . Thyroid cancer ([facility]2015  . UTI (lower urinary tract infection)     Past Surgical History:  Procedure Laterality Date  . ABDOMINAL HYSTERECTOMY  1996  . BIOPSY  03/07/2015   Procedure: BIOPSY;  Surgeon: RoDaneil DolinMD;  [facility];  Service: Endoscopy;;  . CATARACT EXTRACTION  2011   With lens implant  . CHOLECYSTECTOMY  2000  . CRANIOTOMY  09/15/2011   Procedure: CRANIOTOMY  TUMOR EXCISION;  Surgeon: Hosie Spangle, MD;  Location: [place] [facility];  Service: Neurosurgery;  Laterality: N/A;  Craniotomy resection of tumor with stealth   . ESOPHAGOGASTRODUODENOSCOPY (EGD) WITH PROPOFOL N/A 03/07/2015   Procedure: ESOPHAGOGASTRODUODENOSCOPY (EGD) WITH PROPOFOL;  Surgeon: Daneil Dolin, MD;  [facility];  Service: Endoscopy;  Laterality: N/A;  . EYE SURGERY  2001  . FOOT SURGERY Left 2005  . LYMPH GLAND EXCISION    . SHOULDER SURGERY Right 1998  . TOTAL THYROIDECTOMY  10-29-2013   [facility]  . VIDEO BRONCHOSCOPY WITH ENDOBRONCHIAL ULTRASOUND N/A 04/11/2014   Procedure: VIDEO BRONCHOSCOPY WITH ENDOBRONCHIAL ULTRASOUND;  Surgeon: Melrose Nakayama, MD;  Location: [facility];  Service: Thoracic;  Laterality: N/A;    Medications:  HOME MEDS: Prior to Admission medications   Medication Sig Start Date End Date Taking? Authorizing Provider  acetaminophen (TYLENOL) 325 MG tablet Take 2 tablets (650 mg total) by mouth every 6 (six) hours as needed for mild pain. 05/10/15  Yes Lavina Hamman, MD  albuterol (PROVENTIL HFA;VENTOLIN HFA) 108 (90 Base) MCG/ACT inhaler Inhale 2 puffs into the lungs every 6 (six) hours as needed for wheezing or shortness of breath. 10/13/15  Yes Reyne Dumas, MD  albuterol (PROVENTIL) (2.5 MG/3ML) 0.083% nebulizer solution Take 3 mLs (2.5 mg total) by nebulization every 4 (four) hours as needed for wheezing or shortness of breath. 04/25/15  Yes Orvan Falconer, MD  budesonide-formoterol [facility]) 160-4.5 MCG/ACT inhaler Inhale 2 puffs into the lungs 2 (two) times daily.   Yes Historical Provider, MD  cefUROXime (CEFTIN) 500 MG tablet Take 500 mg by mouth 2 (two) times daily with a meal.   Yes Historical Provider, MD  chlorpheniramine-HYDROcodone (TUSSIONEX PENNKINETIC ER) 10-8 MG/5ML SUER Take 5 mLs by mouth every 12 (twelve) hours as needed for cough. 10/13/15  Yes Reyne Dumas, MD  levothyroxine (SYNTHROID, LEVOTHROID) 112 MCG tablet Take 112 mcg by mouth daily before breakfast.   Yes Historical Provider, MD  magic mouthwash w/lidocaine SOLN Take 5 mLs by mouth 4 (four) times daily  as needed for mouth pain. 03/13/15  Yes Velvet Bathe, MD  magnesium oxide (MAG-OX) 400 (241.3 Mg) MG tablet Take 1 tablet (400 mg total) by mouth 2 (two) times daily. 10/13/15  Yes Reyne Dumas, MD  megestrol (MEGACE) 400 MG/10ML suspension Take 10 mLs (400 mg total) by mouth daily. 05/10/15  Yes Lavina Hamman, MD  ondansetron (ZOFRAN ODT) 8 MG disintegrating tablet Take 1 tablet (8 mg total) by mouth every 8 (eight) hours as needed for nausea or vomiting. 04/05/14  Yes Pattricia Boss, MD  Oxycodone HCl 20 MG TABS Take 1 tablet by mouth every hour as needed (pain).    Yes Historical Provider, MD  pantoprazole (PROTONIX) 40 MG tablet Take 1 tablet (40 mg total) by mouth 2 (two) times daily. 03/13/15  Yes Velvet Bathe, MD  polyethylene glycol (MIRALAX / GLYCOLAX) packet Take 17 g by mouth daily as needed for moderate constipation. Reported on 04/27/2015   Yes Historical Provider, MD  potassium chloride SA (K-DUR,KLOR-CON) 20 MEQ tablet Take 40 mEq by mouth 2 (two) times daily. Dissolve in water and take either in applesauce or drink the water.   Yes Historical Provider, MD  predniSONE (DELTASONE) 20 MG tablet Take daily by mouth: 40 mg x3 days, then 20 mg x3 days, then 10 mg x3 days, then stop. 11/05/15  Yes Samuella Cota, MD  sertraline (ZOLOFT) 50 MG tablet Take  50 mg by mouth daily.   Yes Historical Provider, MD  Starch-Maltodextrin (THICK-IT PO) Take by mouth daily. Add to all liquid.   Yes Historical Provider, MD  tiotropium (SPIRIVA) 18 MCG inhalation capsule Place 18 mcg into inhaler and inhale daily.   Yes Historical Provider, MD     Allergies:  Allergies  Allergen Reactions  . Penicillins Swelling    " my brain swelled, and mymouth" Has patient had a PCN reaction causing immediate rash, facial/tongue/throat swelling, SOB or lightheadedness with hypotension: Yes Has patient had a PCN reaction causing severe rash involving mucus membranes or skin necrosis: No Has patient had a PCN reaction that  required hospitalization No Has patient had a PCN reaction occurring within the last 10 years: No If all of the above answers are "NO", then may proceed with Cephalosporin use.   . Other Swelling    PT ALSO REACTS TO PAPER TAPE AND BAND-AIDS.  Marland Kitchen Codeine Itching    Mild takes benadryl  . Tape Rash    Social History:   reports that she quit smoking about 4 years ago. Her smoking use included Cigarettes. She has a 40.00 pack-year smoking history. She has never used smokeless tobacco. She reports that she does not drink alcohol or use drugs.  Family History: Family History  Problem Relation Age of Onset  . Asthma Mother   . Kidney failure Father   . Diabetes Sister   . Heart attack Sister   . COPD Sister   . Colon cancer Brother   . Aneurysm Paternal Grandmother     Brain  . Parkinsonism Maternal Uncle   . COPD Brother      Physical Exam: Vitals:   11/10/15 2154 11/11/15 0300 11/11/15 0534 11/11/15 0725  BP: 103/89  105/75   Pulse: 93  91   Resp: (!) 24  20   Temp: 97.8 F (36.6 C)  97.8 F (36.6 C)   TempSrc: Oral  Oral   SpO2: 97% 93% 96% 96%  Weight: 49.6 kg (109 lb 6.4 oz)     Height: '5\' 3"'$  (1.6 m)      Blood pressure 105/75, pulse 91, temperature 97.8 F (36.6 C), temperature source Oral, resp. rate 20, height '5\' 3"'$  (1.6 m), weight 49.6 kg (109 lb 6.4 oz), SpO2 96 %.  GEN:  Pleasant  patient lying in the stretcher in no acute distress; cooperative with exam. PSYCH:  alert and oriented x4; does not appear anxious or depressed; affect is appropriate. HEENT: Mucous membranes pink and anicteric; PERRLA; EOM intact; no cervical lymphadenopathy nor thyromegaly or carotid bruit; no JVD; There were no stridor. Neck is very supple. Breasts:: Not examined CHEST WALL: No tenderness CHEST: Normal respiration, clear to auscultation bilaterally.  HEART: Regular rate and rhythm.  There are no murmur, rub, or gallops.   BACK: No kyphosis or scoliosis; no CVA  tenderness ABDOMEN: soft and non-tender; no masses, no organomegaly, normal abdominal bowel sounds; no pannus; no intertriginous candida. There is no rebound and no distention. Rectal Exam: Not done EXTREMITIES: No bone or joint deformity; age-appropriate arthropathy of the hands and knees; no edema; no ulcerations.  There is no calf tenderness. Genitalia: not examined PULSES: 2+ and symmetric SKIN: Normal hydration no rash or ulceration CNS: Cranial nerves 2-12 grossly intact no focal lateralizing neurologic deficit.  Speech is fluent; uvula elevated with phonation, facial symmetry and tongue midline. DTR are normal bilaterally, cerebella exam is intact, barbinski is negative and strengths are equaled bilaterally.  No sensory loss.   Labs on Admission:  Basic Metabolic Panel:  Recent Labs Lab 11/05/15 0523 11/10/15 1309  NA 135 135  K 3.9 3.6  CL 108 109  CO2 21* 17*  GLUCOSE 119* 94  BUN 16 16  CREATININE 0.49 0.69  CALCIUM 8.3* 7.9*   Liver Function Tests:  Recent Labs Lab 11/10/15 1309  AST 21  ALT 38  ALKPHOS 62  BILITOT 0.8  PROT 6.2*  ALBUMIN 2.7*   CBC:  Recent Labs Lab 11/05/15 0523 11/10/15 1309  WBC 10.8* 20.1*  NEUTROABS  --  18.3*  HGB 11.6* 12.9  HCT 34.9* 40.6  MCV 81.4 81.2  PLT 264 309   Cardiac Enzymes:  Recent Labs Lab 11/10/15 1309  TROPONINI 0.07*   Radiological Exams on Admission: Ct Head Wo Contrast  Result Date: 11/10/2015 CLINICAL DATA:  60 year old female with altered mental status. 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 Subsequent encounter. EXAM: CT HEAD WITHOUT CONTRAST TECHNIQUE: Contiguous axial images were obtained from the base of the skull through the vertex without intravenous contrast. COMPARISON:  07/21/2015 MR. 06/18/2015 CT. FINDINGS: Global atrophy.  No hydrocephalus. Prior left posterior frontal -parietal craniotomy with encephalomalacia below the  craniotomy site similar to prior exam. Hypodensity left cerebellum may represent streak artifact or result of tumor. Patient was noted to have metastatic lesions in the cerebellum on recent MR. Prominent white matter changes may indicate result of treatment of tumor without CT evidence of large acute infarct. No intracranial hemorrhage. Scattered minimal calvarial lucencies are stable. Visualized mastoid air cells, middle ear cavities and paranasal sinuses are clear. No acute orbital abnormality. IMPRESSION: Global atrophy. Prior left posterior frontal -parietal craniotomy with encephalomalacia below the craniotomy site similar to prior exam. Hypodensity left cerebellum may represent streak artifact or result of tumor. Patient was noted to have metastatic lesions in the cerebellum on recent MR. Prominent white matter changes may indicate result of treatment of tumor. No intracranial hemorrhage or CT evidence of large acute infarct. Electronically Signed   By: Genia Del M.D.   On: 11/10/2015 16:30   Ct Chest W Contrast  Result Date: 11/10/2015 CLINICAL DATA:  Shortness of breath EXAM: CT CHEST WITH CONTRAST TECHNIQUE: Multidetector CT imaging of the chest was performed during intravenous contrast administration. CONTRAST:  50m ISOVUE-300 IOPAMIDOL (ISOVUE-300) INJECTION 61% COMPARISON:  10/09/2015 FINDINGS: Cardiovascular: Heart size is at upper normal. Tiny pericardial effusion persists. Coronary artery calcification is noted. No evidence for dissection of the thoracic aorta. No thoracic aortic aneurysm. Bolus timing is suboptimal for assessment of pulmonary embolism. Despite the poor opacification of the pulmonary arteries, there is sufficient contrast to exclude large central pulmonary embolus in the main pulmonary arteries or lobar pulmonary arteries. Mediastinum/Nodes: No mediastinal lymphadenopathy. 12 mm short axis left hilar lymph node measured previously is unchanged. 10 mm short axis right hilar  lymph node measured on the previous study is now 9 mm. Lungs/Pleura: Moderate to advanced changes of emphysema are noted bilaterally. There has been interval development of relatively diffuse bilateral interstitial and central parenchymal nodularity, having a tree in bud/peribronchovascular distribution in some areas and not so much in others. Scattered areas of ill-defined airspace consolidation are seen in the periphery of the upper lobes bilaterally. 11 mm pulmonary nodule seen in the posterior right lower lobe on the previous study measures 8 mm today. Upper Abdomen: Calcified lymphadenopathy in the gastrohepatic ligament, hepatoduodenal ligament, retrocrural space, and upper retroperitoneum  shows no substantial interval change. 3 the ring-enhancing lesions are identified within the visualized liver measuring up to 17 mm (image 142 series 2). Musculoskeletal: Bone windows reveal no worrisome lytic or sclerotic osseous lesions. IMPRESSION: 1. Interval progression of the interstitial disease and peribronchovascular ground-glass opacities and nodularity, concerning for continued progression of lymphangitic tumor spread. 2. Interval development of rim enhancing necrotic lesions in the liver parenchyma, consistent with new hepatic metastases. 3. No substantial change in the mineralized mediastinal, retrocrural, and upper abdominal lymphadenopathy in this patient with a history of metastatic thyroid cancer. 4. Moderate changes of emphysema again noted. 5. Abdominal aortic atherosclerosis. Electronically Signed   By: Misty Stanley M.D.   On: 11/10/2015 16:32   Dg Chest Port 1 View  Result Date: 11/10/2015 CLINICAL DATA:  Fever with shortness of breath. EXAM: PORTABLE CHEST 1 VIEW COMPARISON:  11/02/2015 FINDINGS: Slight improvement aeration at the right lung base with persistent right mid lung airspace opacity. Interstitial markings are diffusely coarsened with chronic features. The cardio pericardial silhouette is  enlarged. The visualized bony structures of the thorax are intact. Telemetry leads overlie the chest. IMPRESSION: Slight interval improvement in aeration at the right base. Otherwise stable. Electronically Signed   By: Misty Stanley M.D.   On: 11/10/2015 12:53    Assessment/Plan Present on Admission: . Metastatic cancer ([place]) . Failure to thrive (0-17)  PLAN:  Aspiration PNA;  Continue with antibiotic and steroids.  Dr Luan Pulling consulted.  Will see if she can improve her respiratory status a little.  Metastatic CA:  Palliative care consulted.  Will likely receive home hospice service.    Other plans as per orders. Code Status: DNR/  Comfort care.   Orvan Falconer, MD.  FACP [facility] Pager [phone] 7pm to 7am.  11/11/2015, 1:10 PM

## 2015-11-12 DIAGNOSIS — R627 Adult failure to thrive: Secondary | ICD-10-CM

## 2015-11-12 DIAGNOSIS — C801 Malignant (primary) neoplasm, unspecified: Secondary | ICD-10-CM

## 2015-11-12 DIAGNOSIS — Z7189 Other specified counseling: Secondary | ICD-10-CM

## 2015-11-12 LAB — URINE CULTURE: CULTURE: NO GROWTH

## 2015-11-12 LAB — HIV ANTIBODY (ROUTINE TESTING W REFLEX): HIV Screen 4th Generation wRfx: NONREACTIVE

## 2015-11-12 MED ORDER — METHYLPREDNISOLONE SODIUM SUCC 125 MG IJ SOLR: 80.0000 mg | Freq: Every day | INTRAMUSCULAR | 0 refills | Status: AC

## 2015-11-12 MED ORDER — DEXTROSE 5 % IV SOLN: 1.0000 g | INTRAVENOUS | Status: AC

## 2015-11-12 NOTE — Progress Notes (Signed)
Subjective: She is substantially better today. Much less congested. I think she is benefiting from treatment for aspiration. Steroids may also be helping.  Objective: Vital signs in last 24 hours: Temp:  [97.7 F (36.5 C)-98.1 F (36.7 C)] 98.1 F (36.7 C) (08/08 2034) Pulse Rate:  [99-107] 107 (08/08 2034) Resp:  [20-24] 24 (08/08 2034) BP: (103-109)/(63-75) 103/63 (08/08 2034) SpO2:  [92 %-98 %] 94 % (08/09 0712) FiO2 (%):  [4 %] 4 % (08/08 2034) Weight change:  Last BM Date: 11/11/15 (small)  Intake/Output from previous day: 08/08 0701 - 08/09 0700 In: 100 [IV Piggyback:100] Out: 50 [Urine:50]  PHYSICAL EXAM General appearance: alert, cooperative and mild distress Resp: She has rhonchi but her chest is much clearer than yesterday Cardio: regular rate and rhythm, S1, S2 normal, no murmur, click, rub or gallop GI: soft, non-tender; bowel sounds normal; no masses,  no organomegaly Extremities: extremities normal, atraumatic, no cyanosis or edema  Lab Results:  Results for orders placed or performed during the hospital encounter of 11/10/15 (from the past 48 hour(s))  Blood gas, arterial ([facility], [facility], [facility])     Status: Abnormal   Collection Time: 11/10/15  1:00 PM  Result Value Ref Range   O2 Content 2.0 L/min   Delivery systems NASAL CANNULA    pH, Arterial 7.429 7.350 - 7.450   pCO2 arterial 26.7 (L) 35.0 - 45.0 mmHg   pO2, Arterial 78 (L) 80.0 - 100.0 mmHg   Bicarbonate 20.1 20.0 - 24.0 mEq/L   TCO2 17.6 0 - 100 mmol/L   Acid-base deficit 6.2 (H) 0.0 - 2.0 mmol/L   O2 Saturation 94.5 %   Patient temperature 37.0    Collection site LEFT RADIAL    Drawn by [id number]    Sample type ARTERIAL DRAW    Allens test (pass/fail) PASS PASS  Comprehensive metabolic panel     Status: Abnormal   Collection Time: 11/10/15  1:09 PM  Result Value Ref Range   Sodium 135 135 - 145 mmol/L   Potassium 3.6 3.5 - 5.1 mmol/L   Chloride 109 101 - 111 mmol/L   CO2 17 (L) 22 - 32 mmol/L    Glucose, Bld 94 65 - 99 mg/dL   BUN 16 6 - 20 mg/dL   Creatinine, Ser 0.69 0.44 - 1.00 mg/dL   Calcium 7.9 (L) 8.9 - 10.3 mg/dL   Total Protein 6.2 (L) 6.5 - 8.1 g/dL   Albumin 2.7 (L) 3.5 - 5.0 g/dL   AST 21 15 - 41 U/L   ALT 38 14 - 54 U/L   Alkaline Phosphatase 62 38 - 126 U/L   Total Bilirubin 0.8 0.3 - 1.2 mg/dL   GFR calc non Af Amer >60 >60 mL/min   GFR calc Af Amer >60 >60 mL/min    Comment: (NOTE) The eGFR has been calculated using the CKD EPI equation. This calculation has not been validated in all clinical situations. eGFR's persistently <60 mL/min signify possible Chronic Kidney Disease.    Anion gap 9 5 - 15  CBC WITH DIFFERENTIAL     Status: Abnormal   Collection Time: 11/10/15  1:09 PM  Result Value Ref Range   WBC 20.1 (H) 4.0 - 10.5 K/uL   RBC 5.00 3.87 - 5.11 MIL/uL   Hemoglobin 12.9 12.0 - 15.0 g/dL   HCT 40.6 36.0 - 46.0 %   MCV 81.2 78.0 - 100.0 fL   MCH 25.8 (L) 26.0 - 34.0 pg   MCHC 31.8 30.0 -  36.0 g/dL   RDW 21.8 (H) 11.5 - 15.5 %   Platelets 309 150 - 400 K/uL   Neutrophils Relative % 91 %   Lymphocytes Relative 2 %   Monocytes Relative 7 %   Eosinophils Relative 0 %   Basophils Relative 0 %   Neutro Abs 18.3 (H) 1.7 - 7.7 K/uL   Lymphs Abs 0.4 (L) 0.7 - 4.0 K/uL   Monocytes Absolute 1.4 (H) 0.1 - 1.0 K/uL   Eosinophils Absolute 0.0 0.0 - 0.7 K/uL   Basophils Absolute 0.0 0.0 - 0.1 K/uL   WBC Morphology WHITE COUNT CONFIRMED ON SMEAR     Comment: MILD LEFT SHIFT (1-5% METAS, OCC MYELO, OCC BANDS)   Smear Review LARGE PLATELETS PRESENT     Comment: GIANT PLATELETS SEEN  Troponin I     Status: Abnormal   Collection Time: 11/10/15  1:09 PM  Result Value Ref Range   Troponin I 0.07 (HH) <0.03 ng/mL    Comment: CRITICAL RESULT CALLED TO, READ BACK BY AND VERIFIED WITH: COCKRAHAM,R AT 1428 ON 11/10/2015 BY ISLEY,B   Blood Culture (routine x 2)     Status: None (Preliminary result)   Collection Time: 11/10/15  1:10 PM  Result Value Ref Range    Specimen Description BLOOD LEFT HAND    Special Requests BOTTLES DRAWN AEROBIC AND ANAEROBIC 6CC EACH    Culture NO GROWTH < 24 HOURS    Report Status PENDING   Blood Culture (routine x 2)     Status: None (Preliminary result)   Collection Time: 11/10/15  1:20 PM  Result Value Ref Range   Specimen Description BLOOD LEFT HAND    Special Requests BOTTLES DRAWN AEROBIC ONLY 4CC    Culture NO GROWTH < 24 HOURS    Report Status PENDING   I-Stat CG4 Lactic Acid, ED  (not at  [facility])     Status: None   Collection Time: 11/10/15  1:25 PM  Result Value Ref Range   Lactic Acid, Venous 0.96 0.5 - 1.9 mmol/L  Culture, respiratory (NON-Expectorated)     Status: None (Preliminary result)   Collection Time: 11/11/15  3:25 AM  Result Value Ref Range   Specimen Description TRACHEAL ASPIRATE    Special Requests NONE    Gram Stain      ABUNDANT WBC PRESENT, PREDOMINANTLY PMN ABUNDANT GRAM NEGATIVE RODS MODERATE YEAST FEW GRAM POSITIVE RODS Performed at [facility]    Culture PENDING    Report Status PENDING   HIV antibody     Status: None   Collection Time: 11/11/15  5:54 AM  Result Value Ref Range   HIV Screen 4th Generation wRfx Non Reactive Non Reactive    Comment: (NOTE) Performed At: [facility] [place], [place] [id number] Lindon Romp MD YO:[id number]   Urinalysis, Routine w reflex microscopic (not at [facility])     Status: None   Collection Time: 11/11/15  6:30 AM  Result Value Ref Range   Color, Urine YELLOW YELLOW   APPearance CLEAR CLEAR   Specific Gravity, Urine 1.015 1.005 - 1.030   pH 6.5 5.0 - 8.0   Glucose, UA NEGATIVE NEGATIVE mg/dL   Hgb urine dipstick NEGATIVE NEGATIVE   Bilirubin Urine NEGATIVE NEGATIVE   Ketones, ur NEGATIVE NEGATIVE mg/dL   Protein, ur NEGATIVE NEGATIVE mg/dL   Nitrite NEGATIVE NEGATIVE   Leukocytes, UA NEGATIVE NEGATIVE    Comment: MICROSCOPIC NOT DONE ON URINES WITH NEGATIVE PROTEIN, BLOOD, LEUKOCYTES, NITRITE,  OR  GLUCOSE <1000 mg/dL.  Strep pneumoniae urinary antigen     Status: None   Collection Time: 11/11/15  6:30 AM  Result Value Ref Range   Strep Pneumo Urinary Antigen NEGATIVE NEGATIVE    Comment:        Infection due to S. pneumoniae cannot be absolutely ruled out since the antigen present may be below the detection limit of the test. Performed at [facility]  11/10/15 1300  PHART 7.429  PO2ART 78*  TCO2 17.6  HCO3 20.1   CULTURES Recent Results (from the past 240 hour(s))  Culture, blood (routine x 2)     Status: None   Collection Time: 11/03/15 12:32 AM  Result Value Ref Range Status   Specimen Description BLOOD LEFT ARM  Final   Special Requests   Final    BOTTLES DRAWN AEROBIC AND ANAEROBIC AEB=8CC ANA=6CC   Culture NO GROWTH 5 DAYS  Final   Report Status 11/08/2015 FINAL  Final  Culture, blood (routine x 2)     Status: None   Collection Time: 11/03/15 12:37 AM  Result Value Ref Range Status   Specimen Description BLOOD RIGHT HAND  Final   Special Requests BOTTLES DRAWN AEROBIC ONLY 6CC  Final   Culture NO GROWTH 5 DAYS  Final   Report Status 11/08/2015 FINAL  Final  Blood Culture (routine x 2)     Status: None (Preliminary result)   Collection Time: 11/10/15  1:10 PM  Result Value Ref Range Status   Specimen Description BLOOD LEFT HAND  Final   Special Requests BOTTLES DRAWN AEROBIC AND ANAEROBIC 6CC EACH  Final   Culture NO GROWTH < 24 HOURS  Final   Report Status PENDING  Incomplete  Blood Culture (routine x 2)     Status: None (Preliminary result)   Collection Time: 11/10/15  1:20 PM  Result Value Ref Range Status   Specimen Description BLOOD LEFT HAND  Final   Special Requests BOTTLES DRAWN AEROBIC ONLY 4CC  Final   Culture NO GROWTH < 24 HOURS  Final   Report Status PENDING  Incomplete  Culture, respiratory (NON-Expectorated)     Status: None (Preliminary result)   Collection Time: 11/11/15  3:25 AM  Result Value Ref  Range Status   Specimen Description TRACHEAL ASPIRATE  Final   Special Requests NONE  Final   Gram Stain   Final    ABUNDANT WBC PRESENT, PREDOMINANTLY PMN ABUNDANT GRAM NEGATIVE RODS MODERATE YEAST FEW GRAM POSITIVE RODS Performed at [facility]    Culture PENDING  Incomplete   Report Status PENDING  Incomplete   Studies/Results: Ct Head Wo Contrast  Result Date: 11/10/2015 CLINICAL DATA:  60 year old female with altered mental status. 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 Subsequent encounter. EXAM: CT HEAD WITHOUT CONTRAST TECHNIQUE: Contiguous axial images were obtained from the base of the skull through the vertex without intravenous contrast. COMPARISON:  07/21/2015 MR. 06/18/2015 CT. FINDINGS: Global atrophy.  No hydrocephalus. Prior left posterior frontal -parietal craniotomy with encephalomalacia below the craniotomy site similar to prior exam. Hypodensity left cerebellum may represent streak artifact or result of tumor. Patient was noted to have metastatic lesions in the cerebellum on recent MR. Prominent white matter changes may indicate result of treatment of tumor without CT evidence of large acute infarct. No intracranial hemorrhage. Scattered minimal calvarial lucencies are stable. Visualized mastoid air cells, middle  ear cavities and paranasal sinuses are clear. No acute orbital abnormality. IMPRESSION: Global atrophy. Prior left posterior frontal -parietal craniotomy with encephalomalacia below the craniotomy site similar to prior exam. Hypodensity left cerebellum may represent streak artifact or result of tumor. Patient was noted to have metastatic lesions in the cerebellum on recent MR. Prominent white matter changes may indicate result of treatment of tumor. No intracranial hemorrhage or CT evidence of large acute infarct. Electronically Signed   By: Genia Del M.D.   On: 11/10/2015 16:30   Ct Chest W  Contrast  Result Date: 11/10/2015 CLINICAL DATA:  Shortness of breath EXAM: CT CHEST WITH CONTRAST TECHNIQUE: Multidetector CT imaging of the chest was performed during intravenous contrast administration. CONTRAST:  33m ISOVUE-300 IOPAMIDOL (ISOVUE-300) INJECTION 61% COMPARISON:  10/09/2015 FINDINGS: Cardiovascular: Heart size is at upper normal. Tiny pericardial effusion persists. Coronary artery calcification is noted. No evidence for dissection of the thoracic aorta. No thoracic aortic aneurysm. Bolus timing is suboptimal for assessment of pulmonary embolism. Despite the poor opacification of the pulmonary arteries, there is sufficient contrast to exclude large central pulmonary embolus in the main pulmonary arteries or lobar pulmonary arteries. Mediastinum/Nodes: No mediastinal lymphadenopathy. 12 mm short axis left hilar lymph node measured previously is unchanged. 10 mm short axis right hilar lymph node measured on the previous study is now 9 mm. Lungs/Pleura: Moderate to advanced changes of emphysema are noted bilaterally. There has been interval development of relatively diffuse bilateral interstitial and central parenchymal nodularity, having a tree in bud/peribronchovascular distribution in some areas and not so much in others. Scattered areas of ill-defined airspace consolidation are seen in the periphery of the upper lobes bilaterally. 11 mm pulmonary nodule seen in the posterior right lower lobe on the previous study measures 8 mm today. Upper Abdomen: Calcified lymphadenopathy in the gastrohepatic ligament, hepatoduodenal ligament, retrocrural space, and upper retroperitoneum shows no substantial interval change. 3 the ring-enhancing lesions are identified within the visualized liver measuring up to 17 mm (image 142 series 2). Musculoskeletal: Bone windows reveal no worrisome lytic or sclerotic osseous lesions. IMPRESSION: 1. Interval progression of the interstitial disease and peribronchovascular  ground-glass opacities and nodularity, concerning for continued progression of lymphangitic tumor spread. 2. Interval development of rim enhancing necrotic lesions in the liver parenchyma, consistent with new hepatic metastases. 3. No substantial change in the mineralized mediastinal, retrocrural, and upper abdominal lymphadenopathy in this patient with a history of metastatic thyroid cancer. 4. Moderate changes of emphysema again noted. 5. Abdominal aortic atherosclerosis. Electronically Signed   By: EMisty StanleyM.D.   On: 11/10/2015 16:32   Dg Chest Port 1 View  Result Date: 11/10/2015 CLINICAL DATA:  Fever with shortness of breath. EXAM: PORTABLE CHEST 1 VIEW COMPARISON:  11/02/2015 FINDINGS: Slight improvement aeration at the right lung base with persistent right mid lung airspace opacity. Interstitial markings are diffusely coarsened with chronic features. The cardio pericardial silhouette is enlarged. The visualized bony structures of the thorax are intact. Telemetry leads overlie the chest. IMPRESSION: Slight interval improvement in aeration at the right base. Otherwise stable. Electronically Signed   By: EMisty StanleyM.D.   On: 11/10/2015 12:53    Medications:  Prior to Admission:  Prescriptions Prior to Admission  Medication Sig Dispense Refill Last Dose  . acetaminophen (TYLENOL) 325 MG tablet Take 2 tablets (650 mg total) by mouth every 6 (six) hours as needed for mild pain.   unknown  . albuterol (PROVENTIL HFA;VENTOLIN HFA) 108 (90 Base) MCG/ACT inhaler  Inhale 2 puffs into the lungs every 6 (six) hours as needed for wheezing or shortness of breath. 1 Inhaler 2 unknown  . albuterol (PROVENTIL) (2.5 MG/3ML) 0.083% nebulizer solution Take 3 mLs (2.5 mg total) by nebulization every 4 (four) hours as needed for wheezing or shortness of breath. 75 mL 12 unknown  . budesonide-formoterol (SYMBICORT) 160-4.5 MCG/ACT inhaler Inhale 2 puffs into the lungs 2 (two) times daily.   11/09/2015 at Unknown  time  . cefUROXime (CEFTIN) 500 MG tablet Take 500 mg by mouth 2 (two) times daily with a meal.   11/09/2015 at Unknown time  . chlorpheniramine-HYDROcodone (TUSSIONEX PENNKINETIC ER) 10-8 MG/5ML SUER Take 5 mLs by mouth every 12 (twelve) hours as needed for cough. 250 mL 0 11/09/2015 at Unknown time  . levothyroxine (SYNTHROID, LEVOTHROID) 112 MCG tablet Take 112 mcg by mouth daily before breakfast.   11/10/2015 at Unknown time  . magic mouthwash w/lidocaine SOLN Take 5 mLs by mouth 4 (four) times daily as needed for mouth pain. 400 mL 0 unknown  . magnesium oxide (MAG-OX) 400 (241.3 Mg) MG tablet Take 1 tablet (400 mg total) by mouth 2 (two) times daily. 60 tablet 1 11/09/2015 at Unknown time  . megestrol (MEGACE) 400 MG/10ML suspension Take 10 mLs (400 mg total) by mouth daily. 240 mL 0 11/09/2015 at Unknown time  . ondansetron (ZOFRAN ODT) 8 MG disintegrating tablet Take 1 tablet (8 mg total) by mouth every 8 (eight) hours as needed for nausea or vomiting. 20 tablet 0 unknown  . Oxycodone HCl 20 MG TABS Take 1 tablet by mouth every hour as needed (pain).    11/10/2015 at Unknown time  . pantoprazole (PROTONIX) 40 MG tablet Take 1 tablet (40 mg total) by mouth 2 (two) times daily. 60 tablet 0 11/09/2015 at Unknown time  . polyethylene glycol (MIRALAX / GLYCOLAX) packet Take 17 g by mouth daily as needed for moderate constipation. Reported on 04/27/2015   Past Week at Unknown time  . potassium chloride SA (K-DUR,KLOR-CON) 20 MEQ tablet Take 40 mEq by mouth 2 (two) times daily. Dissolve in water and take either in applesauce or drink the water.   11/09/2015 at Unknown time  . predniSONE (DELTASONE) 20 MG tablet Take daily by mouth: 40 mg x3 days, then 20 mg x3 days, then 10 mg x3 days, then stop. 21 tablet 0 11/09/2015 at Unknown time  . sertraline (ZOLOFT) 50 MG tablet Take 50 mg by mouth daily.   11/09/2015 at Unknown time  . Starch-Maltodextrin (THICK-IT PO) Take by mouth daily. Add to all liquid.   11/09/2015 at Unknown  time  . tiotropium (SPIRIVA) 18 MCG inhalation capsule Place 18 mcg into inhaler and inhale daily.   11/09/2015 at Unknown time   Scheduled: . budesonide (PULMICORT) nebulizer solution  0.25 mg Nebulization BID  . ceFEPime (MAXIPIME) IV  1 g Intravenous Q12H  . glycopyrrolate  0.1 mg Intravenous Once  . ipratropium-albuterol  3 mL Nebulization Q6H  . levothyroxine  112 mcg Oral QAC breakfast  . methylPREDNISolone (SOLU-MEDROL) injection  40 mg Intravenous Q12H  . morphine CONCENTRATE  10 mg Oral Q4H  . predniSONE  10 mg Oral Q breakfast  . RESOURCE THICKENUP CLEAR   Oral Daily  . senna-docusate  2 tablet Oral BID  . sertraline  50 mg Oral Daily   Continuous:  YKD:[id number], albuterol, atropine, bisacodyl, chlorpheniramine-HYDROcodone, magic mouthwash **OR** lidocaine, LORazepam, morphine CONCENTRATE  Assesment: She has metastatic thyroid cancer to brain and lung  and now liver. She is clearly appropriate for hospice but she also has aspiration pneumonia and is improving with treatment. Although this is not typically done with hospice she might benefit from a symptomatic point of view by having some IV antibiotics and steroids. I have discussed with case management. She has had severe chest congestion and felt like she was drowning in her own secretions but is now better with about 24 hours of treatment. Active Problems:   Metastatic cancer ([place])   Failure to thrive (0-17)   Goals of care, counseling/discussion    Plan: As above. Alternatively I think she could possibly go home on by mouth antibiotics and steroids but she was on by mouth antibiotics when she came to the hospital    LOS: 2 days   , L 11/12/2015, 8:56 AM

## 2015-11-12 NOTE — Progress Notes (Signed)
Nutrition Brief Note  Chart reviewed.  Pt has metastatic disease and is  transitioning to comfort care.  She is NPO following a ST evaluation yesterday. Based on review of palliative assessment pt does not desire alternate nutrition measures such as tube feeding. No further nutrition interventions warranted at this time. If her plan of care changes please re-consult.  Colman Cater MS,RD,CSG,LDN Office: [phone] Pager: [phone]

## 2015-11-12 NOTE — Progress Notes (Signed)
Daily Progress Note   Patient Name: Haley Lowe       Date: 11/12/2015 DOB: [birth year]  Age: 60 y.o. MRN#: [id number] Attending Physician: Kathie Dike, MD Primary Care Physician: [name], MD Admit Date: 11/10/2015  Reason for Consultation/Follow-up: Disposition, Establishing goals of care, Hospice Evaluation, Pain control and Psychosocial/spiritual support  Subjective: Mrs. Friberg is resting quietly in bed with her husband and 2 daughters at bedside. She is able to make eye contact, and denies pain at this time. Family states that pain regiment has been effective for her. We talk about disposition to home with hospice. They share that their goal is to continue IV antibiotics for comfort. They talk about her improvement in breathing over the last 24 hour. Daughter state that they feel this is related to IV antibiotic use, I share that I feel that it is also related to the schedule morphine easing her breathing.   Family shares that hospice of [facility] has declined to accept their mother with IV antibiotics, therefore they have elected to choose an alternative hospice, [facility], at this time.   Length of Stay: 2  Current Medications: Scheduled Meds:  . budesonide (PULMICORT) nebulizer solution  0.25 mg Nebulization BID  . ceFEPime (MAXIPIME) IV  1 g Intravenous Q12H  . glycopyrrolate  0.1 mg Intravenous Once  . ipratropium-albuterol  3 mL Nebulization Q6H  . levothyroxine  112 mcg Oral QAC breakfast  . methylPREDNISolone (SOLU-MEDROL) injection  40 mg Intravenous Q12H  . morphine CONCENTRATE  10 mg Oral Q4H  . predniSONE  10 mg Oral Q breakfast  . RESOURCE THICKENUP CLEAR   Oral Daily  . senna-docusate  2 tablet Oral BID  . sertraline  50 mg Oral Daily    Continuous  Infusions:    PRN Meds: acetaminophen, albuterol, atropine, bisacodyl, chlorpheniramine-HYDROcodone, magic mouthwash **OR** lidocaine, LORazepam, morphine CONCENTRATE  Physical Exam  Constitutional: No distress.  HENT:  Head: Normocephalic and atraumatic.  Cardiovascular: Normal rate.   Pulmonary/Chest: Effort normal.  Audible rhonchi, wet voice  Abdominal: Soft. She exhibits no distension.  Neurological: She is alert.  Skin: Skin is warm and dry.  Nursing note and vitals reviewed.           Vital Signs: BP 103/63 (BP Location: Left Arm)   Pulse (!) 107   Temp  98.1 F (36.7 C) (Oral)   Resp (!) 24   Ht '5\' 3"'$  (1.6 m)   Wt 49.6 kg (109 lb 6.4 oz)   SpO2 95%   BMI 19.38 kg/m  SpO2: SpO2: 95 % O2 Device: O2 Device: Nasal Cannula O2 Flow Rate: O2 Flow Rate (L/min): 4 L/min  Intake/output summary:  Intake/Output Summary (Last 24 hours) at 11/12/15 1555 Last data filed at 11/12/15 1504  Gross per 24 hour  Intake              100 ml  Output                0 ml  Net              100 ml   LBM: Last BM Date: 11/11/15 (small) Baseline Weight: Weight: 52.2 kg (115 lb) Most recent weight: Weight: 49.6 kg (109 lb 6.4 oz)       Palliative Assessment/Data:    Flowsheet Rows   Flowsheet Row Most Recent Value  Intake Tab  Referral Department  Hospitalist  Unit at Time of Referral  Med/Surg Unit  Palliative Care Primary Diagnosis  Pulmonary  Date Notified  11/10/15  Palliative Care Type  New Palliative care  Reason for referral  Clarify Goals of Care, End of Life Care Assistance  Date of Admission  11/10/15  Date first seen by Palliative Care  11/11/15  # of days Palliative referral response time  1 Day(s)  # of days IP prior to Palliative referral  0  Clinical Assessment  Palliative Performance Scale Score  30%  Pain Max last 24 hours  Not able to report  Pain Min Last 24 hours  Not able to report  Dyspnea Max Last 24 Hours  Not able to report  Dyspnea Min Last 24  hours  Not able to report  Psychosocial & Spiritual Assessment  Palliative Care Outcomes  Patient/Family meeting held?  Yes  Who was at the meeting?  daughter Letta Median, husband Scientist, physiological  Palliative Care Outcomes  Improved pain interventions, Provided advance care planning, Transitioned to hospice, Provided end of life care assistance, Provided psychosocial or spiritual support  Patient/Family wishes: Interventions discontinued/not started   Mechanical Ventilation  Palliative Care follow-up planned  -- [follow up while at [facility]]      Patient Active Problem List   Diagnosis Date Noted  . Goals of care, counseling/discussion   . Failure to thrive (0-17) 11/10/2015  . DNR (do not resuscitate) 11/03/2015  . Generalized weakness 10/09/2015  . Cough 10/09/2015  . Weakness 05/04/2015  . Near syncope 05/04/2015  . Hypomagnesemia 05/01/2015  . Palliative care encounter   . DNR (do not resuscitate) discussion   . Controlled diabetes mellitus type II without complication ([place]) 27/25/3664  . Pressure ulcer 03/16/2015  . Antineoplastic chemotherapy induced pancytopenia ([place]) 03/16/2015  . Hypotension 03/15/2015  . 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 Mucositis oral   . Esophagitis 03/09/2015  . Protein-calorie malnutrition, severe 03/07/2015  . Dysphagia   . Odynophagia   . Barrett's esophagus with dysplasia   . Esophageal reflux   . Metastatic cancer ([place])   . Mucosal abnormality of esophagus   . Aspiration pneumonia ([place]) 03/03/2015  . Mucositis due to radiation therapy 03/03/2015  . COPD (chronic obstructive pulmonary disease) ([place]) 03/03/2015  . Hyponatremia 03/03/2015  . Hypokalemia 03/03/2015  . Leukopenia 03/03/2015  . Thrombocytopenia ([place]) 03/03/2015  . Hypothyroid 03/03/2015  . Dehydration   . GERD (gastroesophageal reflux disease)  07/29/2014  . Chest tightness 07/28/2014  . Primary papillary thyroid carcinoma ([place]) 11/21/2013  . Occlusion and stenosis of carotid artery without  mention of cerebral infarction 07/18/2012  . DVT (deep venous thrombosis) ([place]) 12/09/2011  . Pulmonary embolism ([place]) 12/09/2011  . COPD with asthma ([place]) 10/18/2011  . Fibromyalgia   . Pulmonary nodules   . Primary cancer of thyroid with metastasis brain/ lung/ throat/ neck/ stomach [facility])     Palliative Care Assessment & Plan   Patient Profile: 60 year old woman with a hx of metastatic thyroid cancer, COPD, recurrent aspiration PNA, who was discharged from the hospital on 8/02 after being treated for aspiration pneumonia. After consulting with Dr. Luan Pulling, pulmonology, she has consulted with palliative care. She will be discharged home with hospice.  Assessment: As above   Recommendations/Plan:  Home with [facility] hospice, families desire is to continue IV antibiotic treatment for comfort. We talk about extending life can sometimes extend comfort  Goals of Care and Additional Recommendations:  Limitations on Scope of Treatment: Home with hospice, treatment plan directed by family.  Code Status:    Code Status Orders        Start     Ordered   11/10/15 2006  Do not attempt resuscitation (DNR)  Continuous    Question Answer Comment  In the event of cardiac or respiratory ARREST Do not call a "code blue"   In the event of cardiac or respiratory ARREST Do not perform Intubation, CPR, defibrillation or ACLS   In the event of cardiac or respiratory ARREST Use medication by any route, position, wound care, and other measures to relive pain and suffering. May use oxygen, suction and manual treatment of airway obstruction as needed for comfort.      11/10/15 2005    Code Status History    Date Active Date Inactive Code Status Order ID Comments User Context   11/03/2015  2:29 AM 11/03/2015  2:29 AM DNR [id number]  Oswald Hillock, MD Inpatient   11/03/2015  2:29 AM 11/05/2015  5:30 PM DNR [id number]  Oswald Hillock, MD Inpatient   10/09/2015  8:03 PM 10/13/2015  3:59 PM DNR [id number]  Roney Jaffe, MD Inpatient   05/04/2015  8:35 PM 05/10/2015  4:16 PM Full Code [id number]  Reubin Milan, MD Inpatient   04/27/2015 10:31 PM 05/02/2015  6:50 PM Full Code [id number]  Velvet Bathe, MD Inpatient   04/19/2015  2:27 AM 04/25/2015  5:23 PM Full Code [id number]  Oswald Hillock, MD Inpatient   03/15/2015 10:21 PM 03/18/2015  6:09 PM Full Code [id number]  Doree Albee, MD ED   03/03/2015  5:16 AM 03/13/2015  3:58 PM Full Code [id number]  Theressa Millard, MD Inpatient   07/28/2014  9:12 PM 07/29/2014  8:53 PM Full Code [id number]  Doree Albee, MD ED   09/15/2011  7:36 PM 09/19/2011  2:16 PM Full Code [id number]  Lynn Ito, RN Inpatient       Prognosis:   < 2 weeks, likely based on recurrent aspiration, infection, cancer burden, frailty, poor PO intake.  Discharge Planning:  Home with Hospice  Care plan was discussed with nursing staff, case manager, social worker, and Dr. Roderic Palau.  Thank you for allowing the Palliative Medicine Team to assist in the care of this patient.   Time In: 1020 Time Out: 1045 Total Time 25 minutes  Prolonged Time Billed  no       Greater than 50%  of this time was spent counseling and coordinating care related to the above assessment and plan.  Elke Holtry A, NP  Please contact Palliative Medicine Team phone at [phone] for questions and concerns.

## 2015-11-12 NOTE — Progress Notes (Signed)
[facility] called to set up PICC line placement for patient.

## 2015-11-12 NOTE — Progress Notes (Signed)
PROGRESS NOTE    Haley Lowe  HUT:[id number] DOB: 04-03-1956 DOA: 11/10/2015 PCP: [name], MD  Outpatient Specialists:  Pulmonology  Oncology   Brief Narrative:  60 year old woman with a hx of metastatic thyroid cancer, COPD, recurrent aspiration PNA, who was discharged from the hospital on 8/02 after being treated for aspiration pneumonia. After consulting with Dr. Luan Pulling, pulmonology, she has consulted with palliative care. She will be discharged home with hospice.   Assessment & Plan:   Active Problems:   Metastatic cancer ([place])   Failure to thrive (0-17)   Goals of care, counseling/discussion  1. Aspiration pneumonia. Patient recurrently aspirated due to metastatic thyroid cancer. Dr. Luan Pulling, pulmonology, has been consulted. Will continue with antibiotics, steroids, and breathing treatments for now. 2. Failure to thrive, secondary to metastatic cancer and recurrent aspiration pneumonia. Palliative consulted. 3. Metastatic cancer. Palliative care has been consulted. Will be discharged with home hospice service.   DVT prophylaxis: Lovenox Code Status: DNR Family Communication: Discussed with patient. Family present at bedside. Disposition Plan: discharge home with hospice in 24 hours.    Consultants:   Palliative  Pulmonology  Procedures:   none  Antimicrobials:   Cefepime 8/7 >>    Subjective: Feeling well. Breathing well. Coughing up sputum.   Objective: Vitals:   11/11/15 1933 11/11/15 2034 11/12/15 0157 11/12/15 0712  BP:  103/63    Pulse:  (!) 107    Resp:  (!) 24    Temp:  98.1 F (36.7 C)    TempSrc:  Oral    SpO2: 98% 94% 96% 94%  Weight:      Height:        Intake/Output Summary (Last 24 hours) at 11/12/15 1302 Last data filed at 11/12/15 1055  Gross per 24 hour  Intake              100 ml  Output                0 ml  Net              100 ml   Filed Weights   11/10/15 1210 11/10/15 1238 11/10/15 2154  Weight: 52.2 kg  (115 lb) 52.2 kg (115 lb) 49.6 kg (109 lb 6.4 oz)    Examination:  General exam: Appears calm and comfortable  Respiratory system:  Bilateral rhonchi with mild wheeze. Respiratory effort normal. Cardiovascular system: S1 & S2 heard, RRR. No JVD, murmurs, rubs, gallops or clicks. No pedal edema. Gastrointestinal system: Abdomen is nondistended, soft and nontender. No organomegaly or masses felt. Normal bowel sounds heard. Central nervous system: Alert and oriented. No focal neurological deficits. Extremities: Symmetric 5 x 5 power. Skin: No rashes, lesions or ulcers Psychiatry: Judgement and insight appear normal. Mood & affect appropriate.     Data Reviewed: I have personally reviewed following labs and imaging studies  CBC:  Recent Labs Lab 11/10/15 1309  WBC 20.1*  NEUTROABS 18.3*  HGB 12.9  HCT 40.6  MCV 81.2  PLT 656   Basic Metabolic Panel:  Recent Labs Lab 11/10/15 1309  NA 135  K 3.6  CL 109  CO2 17*  GLUCOSE 94  BUN 16  CREATININE 0.69  CALCIUM 7.9*   GFR: Estimated Creatinine Clearance: 59.3 mL/min (by C-G formula based on SCr of 0.8 mg/dL). Liver Function Tests:  Recent Labs Lab 11/10/15 1309  AST 21  ALT 38  ALKPHOS 62  BILITOT 0.8  PROT 6.2*  ALBUMIN 2.7*  Cardiac Enzymes:  Recent Labs Lab 11/10/15 1309  TROPONINI 0.07*   Urine analysis:    Component Value Date/Time   COLORURINE YELLOW 11/11/2015 0630   APPEARANCEUR CLEAR 11/11/2015 0630   LABSPEC 1.015 11/11/2015 0630   PHURINE 6.5 11/11/2015 0630   GLUCOSEU NEGATIVE 11/11/2015 0630   HGBUR NEGATIVE 11/11/2015 0630   BILIRUBINUR NEGATIVE 11/11/2015 0630   KETONESUR NEGATIVE 11/11/2015 0630   PROTEINUR NEGATIVE 11/11/2015 0630   UROBILINOGEN 0.2 07/28/2014 2004   NITRITE NEGATIVE 11/11/2015 0630   LEUKOCYTESUR NEGATIVE 11/11/2015 0630   Sepsis Labs: '@LABRCNTIP'$ (procalcitonin:4,lacticidven:4)  ) Recent Results (from the past 240 hour(s))  Culture, blood (routine x 2)      Status: None   Collection Time: 11/03/15 12:32 AM  Result Value Ref Range Status   Specimen Description BLOOD LEFT ARM  Final   Special Requests   Final    BOTTLES DRAWN AEROBIC AND ANAEROBIC AEB=8CC ANA=6CC   Culture NO GROWTH 5 DAYS  Final   Report Status 11/08/2015 FINAL  Final  Culture, blood (routine x 2)     Status: None   Collection Time: 11/03/15 12:37 AM  Result Value Ref Range Status   Specimen Description BLOOD RIGHT HAND  Final   Special Requests BOTTLES DRAWN AEROBIC ONLY 6CC  Final   Culture NO GROWTH 5 DAYS  Final   Report Status 11/08/2015 FINAL  Final  Blood Culture (routine x 2)     Status: None (Preliminary result)   Collection Time: 11/10/15  1:10 PM  Result Value Ref Range Status   Specimen Description BLOOD LEFT HAND  Final   Special Requests BOTTLES DRAWN AEROBIC AND ANAEROBIC 6CC EACH  Final   Culture NO GROWTH 2 DAYS  Final   Report Status PENDING  Incomplete  Blood Culture (routine x 2)     Status: None (Preliminary result)   Collection Time: 11/10/15  1:20 PM  Result Value Ref Range Status   Specimen Description BLOOD LEFT HAND  Final   Special Requests BOTTLES DRAWN AEROBIC ONLY 4CC  Final   Culture NO GROWTH 2 DAYS  Final   Report Status PENDING  Incomplete  Culture, respiratory (NON-Expectorated)     Status: None (Preliminary result)   Collection Time: 11/11/15  3:25 AM  Result Value Ref Range Status   Specimen Description TRACHEAL ASPIRATE  Final   Special Requests NONE  Final   Gram Stain   Final    ABUNDANT WBC PRESENT, PREDOMINANTLY PMN ABUNDANT GRAM NEGATIVE RODS MODERATE YEAST FEW GRAM POSITIVE RODS    Culture   Final    CULTURE REINCUBATED FOR BETTER GROWTH Performed at [facility]    Report Status PENDING  Incomplete  Urine culture     Status: None   Collection Time: 11/11/15  4:45 AM  Result Value Ref Range Status   Specimen Description URINE, CATHETERIZED  Final   Special Requests NONE  Final   Culture NO  GROWTH Performed at [facility]   Final   Report Status 11/12/2015 FINAL  Final         Radiology Studies: Ct Head Wo Contrast  Result Date: 11/10/2015 CLINICAL DATA:  60 year old female with altered mental status. 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 Subsequent encounter. EXAM: CT HEAD WITHOUT CONTRAST TECHNIQUE: Contiguous axial images were obtained from the base of the skull through the vertex without intravenous contrast. COMPARISON:  07/21/2015 MR. 06/18/2015 CT. FINDINGS: Global atrophy.  No hydrocephalus.  Prior left posterior frontal -parietal craniotomy with encephalomalacia below the craniotomy site similar to prior exam. Hypodensity left cerebellum may represent streak artifact or result of tumor. Patient was noted to have metastatic lesions in the cerebellum on recent MR. Prominent white matter changes may indicate result of treatment of tumor without CT evidence of large acute infarct. No intracranial hemorrhage. Scattered minimal calvarial lucencies are stable. Visualized mastoid air cells, middle ear cavities and paranasal sinuses are clear. No acute orbital abnormality. IMPRESSION: Global atrophy. Prior left posterior frontal -parietal craniotomy with encephalomalacia below the craniotomy site similar to prior exam. Hypodensity left cerebellum may represent streak artifact or result of tumor. Patient was noted to have metastatic lesions in the cerebellum on recent MR. Prominent white matter changes may indicate result of treatment of tumor. No intracranial hemorrhage or CT evidence of large acute infarct. Electronically Signed   By: Genia Del M.D.   On: 11/10/2015 16:30   Ct Chest W Contrast  Result Date: 11/10/2015 CLINICAL DATA:  Shortness of breath EXAM: CT CHEST WITH CONTRAST TECHNIQUE: Multidetector CT imaging of the chest was performed during intravenous contrast administration. CONTRAST:  32m  ISOVUE-300 IOPAMIDOL (ISOVUE-300) INJECTION 61% COMPARISON:  10/09/2015 FINDINGS: Cardiovascular: Heart size is at upper normal. Tiny pericardial effusion persists. Coronary artery calcification is noted. No evidence for dissection of the thoracic aorta. No thoracic aortic aneurysm. Bolus timing is suboptimal for assessment of pulmonary embolism. Despite the poor opacification of the pulmonary arteries, there is sufficient contrast to exclude large central pulmonary embolus in the main pulmonary arteries or lobar pulmonary arteries. Mediastinum/Nodes: No mediastinal lymphadenopathy. 12 mm short axis left hilar lymph node measured previously is unchanged. 10 mm short axis right hilar lymph node measured on the previous study is now 9 mm. Lungs/Pleura: Moderate to advanced changes of emphysema are noted bilaterally. There has been interval development of relatively diffuse bilateral interstitial and central parenchymal nodularity, having a tree in bud/peribronchovascular distribution in some areas and not so much in others. Scattered areas of ill-defined airspace consolidation are seen in the periphery of the upper lobes bilaterally. 11 mm pulmonary nodule seen in the posterior right lower lobe on the previous study measures 8 mm today. Upper Abdomen: Calcified lymphadenopathy in the gastrohepatic ligament, hepatoduodenal ligament, retrocrural space, and upper retroperitoneum shows no substantial interval change. 3 the ring-enhancing lesions are identified within the visualized liver measuring up to 17 mm (image 142 series 2). Musculoskeletal: Bone windows reveal no worrisome lytic or sclerotic osseous lesions. IMPRESSION: 1. Interval progression of the interstitial disease and peribronchovascular ground-glass opacities and nodularity, concerning for continued progression of lymphangitic tumor spread. 2. Interval development of rim enhancing necrotic lesions in the liver parenchyma, consistent with new hepatic  metastases. 3. No substantial change in the mineralized mediastinal, retrocrural, and upper abdominal lymphadenopathy in this patient with a history of metastatic thyroid cancer. 4. Moderate changes of emphysema again noted. 5. Abdominal aortic atherosclerosis. Electronically Signed   By: EMisty StanleyM.D.   On: 11/10/2015 16:32        Scheduled Meds: . budesonide (PULMICORT) nebulizer solution  0.25 mg Nebulization BID  . ceFEPime (MAXIPIME) IV  1 g Intravenous Q12H  . glycopyrrolate  0.1 mg Intravenous Once  . ipratropium-albuterol  3 mL Nebulization Q6H  . levothyroxine  112 mcg Oral QAC breakfast  . methylPREDNISolone (SOLU-MEDROL) injection  40 mg Intravenous Q12H  . morphine CONCENTRATE  10 mg Oral Q4H  . predniSONE  10 mg Oral Q breakfast  .  RESOURCE THICKENUP CLEAR   Oral Daily  . senna-docusate  2 tablet Oral BID  . sertraline  50 mg Oral Daily   Continuous Infusions:    LOS: 2 days    Time spent: 25 minutes    Kathie Dike, MD [facility] Pager [phone] If 7PM-7AM, please contact night-coverage [email or web address] Password [email or web address] 11/12/2015, 1:02 PM   By signing my name below, I, Collene Leyden, attest that this documentation has been prepared under the direction and in the presence of Kathie Dike, MD. Electronically signed: Collene Leyden, Scribe. 11/12/15 2:45PM   I, Dr. Kathie Dike, personally performed the services described in this documentaiton. All medical record entries made by the scribe were at my direction and in my presence. I have reviewed the chart and agree that the record reflects my personal performance and is accurate and complete  Kathie Dike, MD, 11/12/2015 2:51 PM

## 2015-11-12 NOTE — Care Management Important Message (Signed)
Important Message  Patient Details  Name: Haley Lowe MRN: [id number] Date of Birth: 1955/08/06   Medicare Important Message Given:  Yes    Sherald Barge, RN 11/12/2015, 1:03 PM

## 2015-11-12 NOTE — Care Management Note (Signed)
Case Management Note  Patient Details  Name: Haley Lowe MRN: [id number] Date of Birth: 1956-03-05  Expected Discharge Date:     11/13/2015             Expected Discharge Plan:  Home w Hospice Care  In-House Referral:  Hospice / Palliative Care  Discharge planning Services  CM Consult  Post Acute Care Choice:  Hospice Choice offered to:  Spouse, Adult Children  DME Arranged:    DME Agency:     HH Arranged:    [place] Agency:  Other - See comment  Status of Service:  In process, will continue to follow  If discussed at Long Length of Stay Meetings, dates discussed:    Additional Comments: Pt wishes to receive IV abx as comfort measures at home alone with hospice services. [facility] unable to provide IV therapy in the home. Pt's family states they will use any hospice that will allow them to use IV abx in the home. [facility] contacted and provided clinical info. They are willing to allow pt to receive IV abx with hospice services in the home. Pt will have PICC line placed today. [facility] rep will make visit to pt's room to meet with family today. Potential DC home today after PICC line insertion vs home tomorrow. Will follow and provide updates to [facility]. [facility] aware that pt will be using [facility]. [facility] also aware that pt will no longer requiring their services.   Sherald Barge, RN 11/12/2015, 12:59 PM

## 2015-11-12 NOTE — Progress Notes (Signed)
Pt transitioned to full comfort measures with plans for [facility] hospice. ST to sign off  Arvil Chaco MA, CCC-SLP Acute Care Speech Language Pathologist  '

## 2015-11-13 DIAGNOSIS — R1310 Dysphagia, unspecified: Secondary | ICD-10-CM

## 2015-11-13 DIAGNOSIS — J690 Pneumonitis due to inhalation of food and vomit: Principal | ICD-10-CM

## 2015-11-13 LAB — CULTURE, RESPIRATORY W GRAM STAIN

## 2015-11-13 LAB — CULTURE, RESPIRATORY

## 2015-11-13 MED ORDER — ATROPINE SULFATE 1 % OP SOLN: 1.0000 [drp] | OPHTHALMIC | 12 refills | Status: AC | PRN

## 2015-11-13 MED ORDER — LORAZEPAM 0.5 MG PO TABS: 0.5000 mg | ORAL_TABLET | Freq: Two times a day (BID) | ORAL | 0 refills | Status: AC | PRN

## 2015-11-13 MED ORDER — MORPHINE SULFATE (CONCENTRATE) 10 MG/0.5ML PO SOLN: 10.0000 mg | ORAL | 0 refills | Status: AC | PRN

## 2015-11-13 MED ORDER — IPRATROPIUM-ALBUTEROL 0.5-2.5 (3) MG/3ML IN SOLN: 3.0000 mL | Freq: Four times a day (QID) | RESPIRATORY_TRACT | 1 refills | Status: AC

## 2015-11-13 NOTE — Care Management Note (Signed)
Case Management Note  Patient Details  Name: Haley Lowe MRN: [id number] Date of Birth: Feb 16, 1956   Expected Discharge Date:      11/13/2015            Expected Discharge Plan:  Home w Hospice Care  In-House Referral:  Hospice / Palliative Care  Discharge planning Services  CM Consult  Post Acute Care Choice:  Hospice Choice offered to:  Spouse, Adult Children  DME Arranged:    DME Agency:     HH Arranged:    [place] Agency:  Other - See comment  Status of Service:  Completed, signed off  If discussed at [place] of Stay Meetings, dates discussed:    Additional Comments: Pt discharging home with hospice services through [facility]. Pt will receive IV abx and steroids for 7 days at home. Pt's family at bedside. [place] O2 tank at bedside for transport. Pt will transport home via personal car. DC summary faxed to [facility] and hospice will be contacted with time of DC.  Sherald Barge, RN 11/13/2015, 10:48 AM

## 2015-11-13 NOTE — Progress Notes (Signed)
Patient with orders to be discharge home with hospice. Discharge instruction reviewed with spouse and family, verbalized understanding. Patient discharge with PICC line for IV antibiotics Patient discharge with [place] upon family request due to end of life care, MD aware. Prescriptions given. Patient stable. Patient left in private vehicle with spouse and family.

## 2015-11-13 NOTE — Discharge Summary (Signed)
Physician Discharge Summary  Haley Lowe FOY:[id number] DOB: [birth year] DOA: 11/10/2015  PCP: [name], MD  Admit date: 11/10/2015 Discharge date: 11/13/2015  Admitted From: Home  Disposition:   Home with hospice services  Recommendations for Outpatient Follow-up:  1. Patient will be discharged home with hospice services for end of life care.   Home Health: [facility] Equipment/Devices: PICC line  Discharge Condition: gaurded CODE STATUS: DNR  Diet recommendation: Heart Healthy   Discharge Diagnoses:  Active Problems:   Metastatic cancer ([place])   Failure to thrive (0-60)   Goals of care, counseling/discussion   Dysphagia   Aspiration pneumonia   COPD   Hypothyroidism  Pt presented with complaints of shortness of breath found to be due to aspiration pneumonia. During admission pulmonology was consulted. She was started on IV steroids and antibiotics. With treatment, she has clinically improved. Due to her recurrent admissions and aspiration risk, patient and family met with palliative care to discuss goals of care. They have elected to pursue hospice services. Discussed with Dr. Luan Pulling and patient will continue IV antibiotics and IV steroids at home to complete her course. She will also use roxanol for management of breathing and shortness of breath.  1. Failure to thrive, secondary to metastatic cancer and recurrent aspiration pneumonia. Palliative was consulted. 2. Metastatic cancer. Palliative care was consulted. Will be discharged with home hospice services.  Discharge Instructions  Discharge Instructions    Diet - low sodium heart healthy    Complete by:  As directed   Increase activity slowly    Complete by:  As directed       Medication List    STOP taking these medications   cefUROXime 500 MG tablet Commonly known as:  CEFTIN   Oxycodone HCl 20 MG Tabs   predniSONE 20 MG tablet Commonly known as:  DELTASONE   tiotropium 18 MCG inhalation  capsule Commonly known as:  SPIRIVA     TAKE these medications   acetaminophen 325 MG tablet Commonly known as:  TYLENOL Take 2 tablets (650 mg total) by mouth every 6 (six) hours as needed for mild pain.   albuterol (2.5 MG/3ML) 0.083% nebulizer solution Commonly known as:  PROVENTIL Take 3 mLs (2.5 mg total) by nebulization every 4 (four) hours as needed for wheezing or shortness of breath.   albuterol 108 (90 Base) MCG/ACT inhaler Commonly known as:  PROVENTIL HFA;VENTOLIN HFA Inhale 2 puffs into the lungs every 6 (six) hours as needed for wheezing or shortness of breath.   atropine 1 % ophthalmic solution Place 1-2 drops under the tongue every 4 (four) hours as needed (secretions).   budesonide-formoterol 160-4.5 MCG/ACT inhaler Commonly known as:  SYMBICORT Inhale 2 puffs into the lungs 2 (two) times daily.   cefTRIAXone 1 g in dextrose 5 % 50 mL Inject 1 g into the vein daily. For 7 more days   chlorpheniramine-HYDROcodone 10-8 MG/5ML Suer Commonly known as:  TUSSIONEX PENNKINETIC ER Take 5 mLs by mouth every 12 (twelve) hours as needed for cough.   ipratropium-albuterol 0.5-2.5 (3) MG/3ML Soln Commonly known as:  DUONEB Take 3 mLs by nebulization every 6 (six) hours.   levothyroxine 112 MCG tablet Commonly known as:  SYNTHROID, LEVOTHROID Take 112 mcg by mouth daily before breakfast.   LORazepam 0.5 MG tablet Commonly known as:  ATIVAN Take 1 tablet (0.5 mg total) by mouth 2 (two) times daily as needed for anxiety or sleep (please offer QHS).   magic mouthwash w/lidocaine Soln Take  5 mLs by mouth 4 (four) times daily as needed for mouth pain.   magnesium oxide 400 (241.3 Mg) MG tablet Commonly known as:  MAG-OX Take 1 tablet (400 mg total) by mouth 2 (two) times daily.   megestrol 400 MG/10ML suspension Commonly known as:  MEGACE Take 10 mLs (400 mg total) by mouth daily.   methylPREDNISolone sodium succinate 125 mg/2 mL injection Commonly known as:   SOLU-MEDROL Inject 1.28 mLs (80 mg total) into the vein daily.   morphine CONCENTRATE 10 MG/0.5ML Soln concentrated solution Place 0.5 mLs (10 mg total) under the tongue every 2 (two) hours as needed for severe pain or shortness of breath.   ondansetron 8 MG disintegrating tablet Commonly known as:  ZOFRAN ODT Take 1 tablet (8 mg total) by mouth every 8 (eight) hours as needed for nausea or vomiting.   pantoprazole 40 MG tablet Commonly known as:  PROTONIX Take 1 tablet (40 mg total) by mouth 2 (two) times daily.   polyethylene glycol packet Commonly known as:  MIRALAX / GLYCOLAX Take 17 g by mouth daily as needed for moderate constipation. Reported on 04/27/2015   potassium chloride SA 20 MEQ tablet Commonly known as:  K-DUR,KLOR-CON Take 40 mEq by mouth 2 (two) times daily. Dissolve in water and take either in applesauce or drink the water.   sertraline 50 MG tablet Commonly known as:  ZOLOFT Take 50 mg by mouth daily.   THICK-IT PO Take by mouth daily. Add to all liquid.       Allergies  Allergen Reactions  . Penicillins Swelling    " my brain swelled, and mymouth" Has patient had a PCN reaction causing immediate rash, facial/tongue/throat swelling, SOB or lightheadedness with hypotension: Yes Has patient had a PCN reaction causing severe rash involving mucus membranes or skin necrosis: No Has patient had a PCN reaction that required hospitalization No Has patient had a PCN reaction occurring within the last 10 years: No If all of the above answers are "NO", then may proceed with Cephalosporin use.   . Other Swelling    PT ALSO REACTS TO PAPER TAPE AND BAND-AIDS.  Marland Kitchen Codeine Itching    Mild takes benadryl  . Tape Rash    Consultations:  Palliative   Pulmonology    Procedures/Studies: Dg Chest 2 View  Result Date: 10/30/2015 CLINICAL DATA:  History of pneumonia, followup EXAM: CHEST  2 VIEW COMPARISON:  CT chest of 10/09/2015 and chest x-ray of the same date  FINDINGS: Compared to the prior chest x-ray as well as the scout film from the CT chest, there may be some worsening of coarse interstitial lung markings and patchy airspace disease in the mid to lower lung feels right worse than left. Although these changes could be inflammatory or infectious, progression of lymphangitic spread of carcinoma is a definite consideration. No pleural effusion is seen. Mediastinal and hilar contours appear unchanged. The heart is mildly enlarged. No bony abnormality is seen. IMPRESSION: Apparent slight worsening of interstitial and patchy airspace disease in both mid lower lung feels. This may be inflammatory or infectious in nature, but the possibility of progressive lymphangitic spread of carcinoma as described on the prior CT of the chest cannot be excluded. Electronically Signed   By: Ivar Drape M.D.   On: 10/30/2015 10:27  Ct Head Wo Contrast  Result Date: 11/10/2015 CLINICAL DATA:  60 year old female with altered mental status. 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  Subsequent encounter. EXAM: CT HEAD WITHOUT CONTRAST TECHNIQUE: Contiguous axial images were obtained from the base of the skull through the vertex without intravenous contrast. COMPARISON:  07/21/2015 MR. 06/18/2015 CT. FINDINGS: Global atrophy.  No hydrocephalus. Prior left posterior frontal -parietal craniotomy with encephalomalacia below the craniotomy site similar to prior exam. Hypodensity left cerebellum may represent streak artifact or result of tumor. Patient was noted to have metastatic lesions in the cerebellum on recent MR. Prominent white matter changes may indicate result of treatment of tumor without CT evidence of large acute infarct. No intracranial hemorrhage. Scattered minimal calvarial lucencies are stable. Visualized mastoid air cells, middle ear cavities and paranasal sinuses are clear. No acute orbital abnormality. IMPRESSION:  Global atrophy. Prior left posterior frontal -parietal craniotomy with encephalomalacia below the craniotomy site similar to prior exam. Hypodensity left cerebellum may represent streak artifact or result of tumor. Patient was noted to have metastatic lesions in the cerebellum on recent MR. Prominent white matter changes may indicate result of treatment of tumor. No intracranial hemorrhage or CT evidence of large acute infarct. Electronically Signed   By: Genia Del M.D.   On: 11/10/2015 16:30   Ct Chest W Contrast  Result Date: 11/10/2015 CLINICAL DATA:  Shortness of breath EXAM: CT CHEST WITH CONTRAST TECHNIQUE: Multidetector CT imaging of the chest was performed during intravenous contrast administration. CONTRAST:  65m ISOVUE-300 IOPAMIDOL (ISOVUE-300) INJECTION 61% COMPARISON:  10/09/2015 FINDINGS: Cardiovascular: Heart size is at upper normal. Tiny pericardial effusion persists. Coronary artery calcification is noted. No evidence for dissection of the thoracic aorta. No thoracic aortic aneurysm. Bolus timing is suboptimal for assessment of pulmonary embolism. Despite the poor opacification of the pulmonary arteries, there is sufficient contrast to exclude large central pulmonary embolus in the main pulmonary arteries or lobar pulmonary arteries. Mediastinum/Nodes: No mediastinal lymphadenopathy. 12 mm short axis left hilar lymph node measured previously is unchanged. 10 mm short axis right hilar lymph node measured on the previous study is now 9 mm. Lungs/Pleura: Moderate to advanced changes of emphysema are noted bilaterally. There has been interval development of relatively diffuse bilateral interstitial and central parenchymal nodularity, having a tree in bud/peribronchovascular distribution in some areas and not so much in others. Scattered areas of ill-defined airspace consolidation are seen in the periphery of the upper lobes bilaterally. 11 mm pulmonary nodule seen in the posterior right lower  lobe on the previous study measures 8 mm today. Upper Abdomen: Calcified lymphadenopathy in the gastrohepatic ligament, hepatoduodenal ligament, retrocrural space, and upper retroperitoneum shows no substantial interval change. 3 the ring-enhancing lesions are identified within the visualized liver measuring up to 17 mm (image 142 series 2). Musculoskeletal: Bone windows reveal no worrisome lytic or sclerotic osseous lesions. IMPRESSION: 1. Interval progression of the interstitial disease and peribronchovascular ground-glass opacities and nodularity, concerning for continued progression of lymphangitic tumor spread. 2. Interval development of rim enhancing necrotic lesions in the liver parenchyma, consistent with new hepatic metastases. 3. No substantial change in the mineralized mediastinal, retrocrural, and upper abdominal lymphadenopathy in this patient with a history of metastatic thyroid cancer. 4. Moderate changes of emphysema again noted. 5. Abdominal aortic atherosclerosis. Electronically Signed   By: EMisty StanleyM.D.   On: 11/10/2015 16:32   Dg Chest Port 1 View  Result Date: 11/10/2015 CLINICAL DATA:  Fever with shortness of breath. EXAM: PORTABLE CHEST 1 VIEW COMPARISON:  11/02/2015 FINDINGS: Slight improvement aeration at the right lung base with persistent right mid lung airspace opacity. Interstitial markings are  diffusely coarsened with chronic features. The cardio pericardial silhouette is enlarged. The visualized bony structures of the thorax are intact. Telemetry leads overlie the chest. IMPRESSION: Slight interval improvement in aeration at the right base. Otherwise stable. Electronically Signed   By: Misty Stanley M.D.   On: 11/10/2015 12:53   Dg Chest Port 1 View  Result Date: 11/03/2015 CLINICAL DATA:  60 year old female with shortness of breath and cough EXAM: PORTABLE CHEST 1 VIEW COMPARISON:  Chest radiograph dated 10/30/2015 FINDINGS: There is emphysematous changes of the lungs.  Patchy right mid to lower lung field nodular density appear grossly stable compared to the prior study. Findings may be infection or inflammatory in etiology. Metastatic disease not excluded. There is mild central vascular prominence, likely mild congestion. A nodular density noted at the left lung base likely represents a nipple shadow. There is no focal consolidation, pleural effusion, or pneumothorax. The cardiac silhouette is within normal limits. No acute osseous pathology identified. IMPRESSION: Patchy area of nodular density in the right mid to lower lung field with no significant interval change. Clinical correlation and follow-up recommended. Electronically Signed   By: Anner Crete M.D.   On: 11/03/2015 00:17    Subjective: Patient reports breathing is stable, she continues to cough  Discharge Exam: Vitals:   11/12/15 2133 11/13/15 0549  BP: 122/88 115/72  Pulse: (!) 103 100  Resp: (!) 24 (!) 22  Temp:  98.6 F (37 C)   Vitals:   11/13/15 0248 11/13/15 0549 11/13/15 0725 11/13/15 0728  BP:  115/72    Pulse:  100    Resp:  (!) 22    Temp:  98.6 F (37 C)    TempSrc:  Oral    SpO2: 96% 92% 96% 96%  Weight:      Height:        General: Pt is alert, awake, not in acute distress Cardiovascular: RRR, S1/S2 +, no rubs, no gallops Respiratory: bilateral rhonchi Abdominal: Soft, NT, ND, bowel sounds + Extremities: no edema, no cyanosis    The results of significant diagnostics from this hospitalization (including imaging, microbiology, ancillary and laboratory) are listed below for reference.     Microbiology: Recent Results (from the past 240 hour(s))  Blood Culture (routine x 2)     Status: None (Preliminary result)   Collection Time: 11/10/15  1:10 PM  Result Value Ref Range Status   Specimen Description BLOOD LEFT HAND  Final   Special Requests BOTTLES DRAWN AEROBIC AND ANAEROBIC [place]  Final   Culture NO GROWTH 2 DAYS  Final   Report Status PENDING   Incomplete  Blood Culture (routine x 2)     Status: None (Preliminary result)   Collection Time: 11/10/15  1:20 PM  Result Value Ref Range Status   Specimen Description BLOOD LEFT HAND  Final   Special Requests BOTTLES DRAWN AEROBIC ONLY 4CC  Final   Culture NO GROWTH 2 DAYS  Final   Report Status PENDING  Incomplete  Culture, respiratory (NON-Expectorated)     Status: None (Preliminary result)   Collection Time: 11/11/15  3:25 AM  Result Value Ref Range Status   Specimen Description TRACHEAL ASPIRATE  Final   Special Requests NONE  Final   Gram Stain   Final    ABUNDANT WBC PRESENT, PREDOMINANTLY PMN ABUNDANT GRAM NEGATIVE RODS MODERATE YEAST FEW GRAM POSITIVE RODS    Culture   Final    CULTURE REINCUBATED FOR BETTER GROWTH Performed at [facility]  Report Status PENDING  Incomplete  Urine culture     Status: None   Collection Time: 11/11/15  4:45 AM  Result Value Ref Range Status   Specimen Description URINE, CATHETERIZED  Final   Special Requests NONE  Final   Culture NO GROWTH Performed at [facility]   Final   Report Status 11/12/2015 FINAL  Final     Labs: BNP (last 3 results)  Recent Labs  04/18/15 2031 10/09/15 1302 11/02/15 2330  BNP 42.0 31.0 51.7   Basic Metabolic Panel:  Recent Labs Lab 11/10/15 1309  NA 135  K 3.6  CL 109  CO2 17*  GLUCOSE 94  BUN 16  CREATININE 0.69  CALCIUM 7.9*   Liver Function Tests:  Recent Labs Lab 11/10/15 1309  AST 21  ALT 38  ALKPHOS 62  BILITOT 0.8  PROT 6.2*  ALBUMIN 2.7*   No results for input(s): LIPASE, AMYLASE in the last 168 hours. No results for input(s): AMMONIA in the last 168 hours. CBC:  Recent Labs Lab 11/10/15 1309  WBC 20.1*  NEUTROABS 18.3*  HGB 12.9  HCT 40.6  MCV 81.2  PLT 309   Cardiac Enzymes:  Recent Labs Lab 11/10/15 1309  TROPONINI 0.07*   BNP: Invalid input(s): POCBNP CBG: No results for input(s): GLUCAP in the last 168 hours. D-Dimer No  results for input(s): DDIMER in the last 72 hours. Hgb A1c No results for input(s): HGBA1C in the last 72 hours. Lipid Profile No results for input(s): CHOL, HDL, LDLCALC, TRIG, CHOLHDL, LDLDIRECT in the last 72 hours. Thyroid function studies No results for input(s): TSH, T4TOTAL, T3FREE, THYROIDAB in the last 72 hours.  Invalid input(s): FREET3 Anemia work up No results for input(s): VITAMINB12, FOLATE, FERRITIN, TIBC, IRON, RETICCTPCT in the last 72 hours. Urinalysis    Component Value Date/Time   COLORURINE YELLOW 11/11/2015 0630   APPEARANCEUR CLEAR 11/11/2015 0630   LABSPEC 1.015 11/11/2015 0630   PHURINE 6.5 11/11/2015 0630   GLUCOSEU NEGATIVE 11/11/2015 0630   HGBUR NEGATIVE 11/11/2015 0630   BILIRUBINUR NEGATIVE 11/11/2015 0630   KETONESUR NEGATIVE 11/11/2015 0630   PROTEINUR NEGATIVE 11/11/2015 0630   UROBILINOGEN 0.2 07/28/2014 2004   NITRITE NEGATIVE 11/11/2015 0630   LEUKOCYTESUR NEGATIVE 11/11/2015 0630   Sepsis Labs Invalid input(s): PROCALCITONIN,  WBC,  LACTICIDVEN Microbiology Recent Results (from the past 240 hour(s))  Blood Culture (routine x 2)     Status: None (Preliminary result)   Collection Time: 11/10/15  1:10 PM  Result Value Ref Range Status   Specimen Description BLOOD LEFT HAND  Final   Special Requests BOTTLES DRAWN AEROBIC AND ANAEROBIC 6CC EACH  Final   Culture NO GROWTH 2 DAYS  Final   Report Status PENDING  Incomplete  Blood Culture (routine x 2)     Status: None (Preliminary result)   Collection Time: 11/10/15  1:20 PM  Result Value Ref Range Status   Specimen Description BLOOD LEFT HAND  Final   Special Requests BOTTLES DRAWN AEROBIC ONLY 4CC  Final   Culture NO GROWTH 2 DAYS  Final   Report Status PENDING  Incomplete  Culture, respiratory (NON-Expectorated)     Status: None (Preliminary result)   Collection Time: 11/11/15  3:25 AM  Result Value Ref Range Status   Specimen Description TRACHEAL ASPIRATE  Final   Special Requests  NONE  Final   Gram Stain   Final    ABUNDANT WBC PRESENT, PREDOMINANTLY PMN ABUNDANT GRAM NEGATIVE RODS MODERATE YEAST  FEW GRAM POSITIVE RODS    Culture   Final    CULTURE REINCUBATED FOR BETTER GROWTH Performed at [facility]    Report Status PENDING  Incomplete  Urine culture     Status: None   Collection Time: 11/11/15  4:45 AM  Result Value Ref Range Status   Specimen Description URINE, CATHETERIZED  Final   Special Requests NONE  Final   Culture NO GROWTH Performed at [facility]   Final   Report Status 11/12/2015 FINAL  Final     Time coordinating discharge: Over 30 minutes  SIGNED:   Kathie Dike, MD  [facility] 11/13/2015, 10:15 AM If 7PM-7AM, please contact night-coverage [email or web address] Password [email or web address]

## 2015-11-13 NOTE — Progress Notes (Signed)
She is scheduled for discharge today with hospice which I think is appropriate. I will of course plan to sign off at this time

## 2015-11-14 ENCOUNTER — Inpatient Hospital Stay: Admission: RE | Admit: 2015-11-14 | Payer: Medicare Other | Source: Ambulatory Visit

## 2015-11-15 LAB — CULTURE, BLOOD (ROUTINE X 2)
Culture: NO GROWTH
Culture: NO GROWTH

## 2015-11-17 ENCOUNTER — Ambulatory Visit: Payer: Self-pay | Admitting: Radiation Oncology

## 2015-11-18 ENCOUNTER — Ambulatory Visit: Payer: Self-pay | Admitting: Radiation Oncology

## 2015-12-05 DEATH — deceased

## 2016-01-02 ENCOUNTER — Encounter: Payer: Self-pay | Admitting: Radiation Therapy

## 2016-04-29 IMAGING — US US SOFT TISSUE HEAD/NECK
1 series · 13 of 25 positions shown · non-contrast
Comparison: 01/18/2012

CLINICAL DATA: Swelling and mass.  History of lung cancer.

EXAM:
THYROID ULTRASOUND
TECHNIQUE: Ultrasound examination of the thyroid gland and adjacent soft
tissues was performed.

[Series 1: us soft tissue head/neck · 0.07mm/px · 13 of 66 slices shown]
[im 1/66]
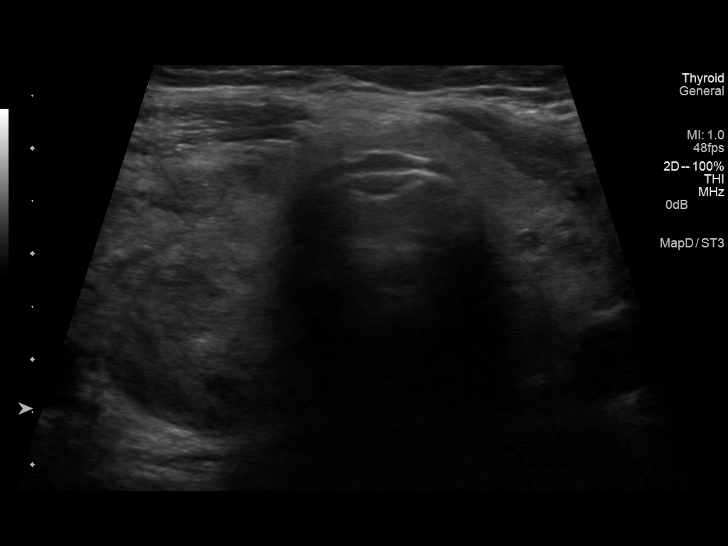
[im 6/66]
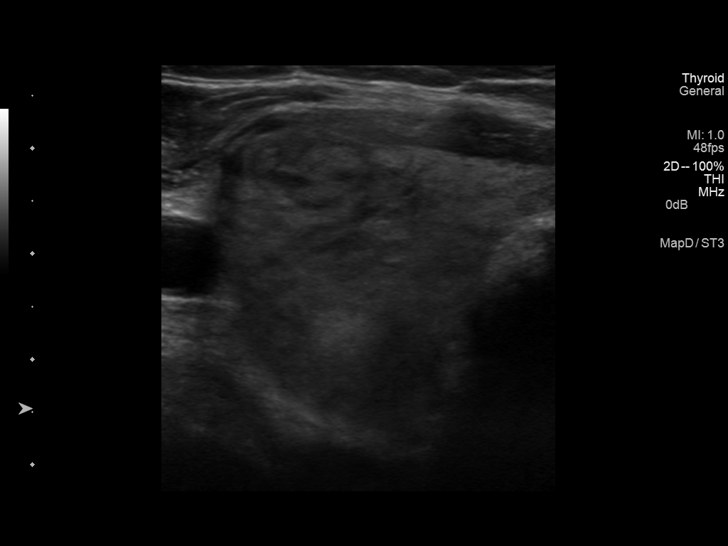
[im 11/66]
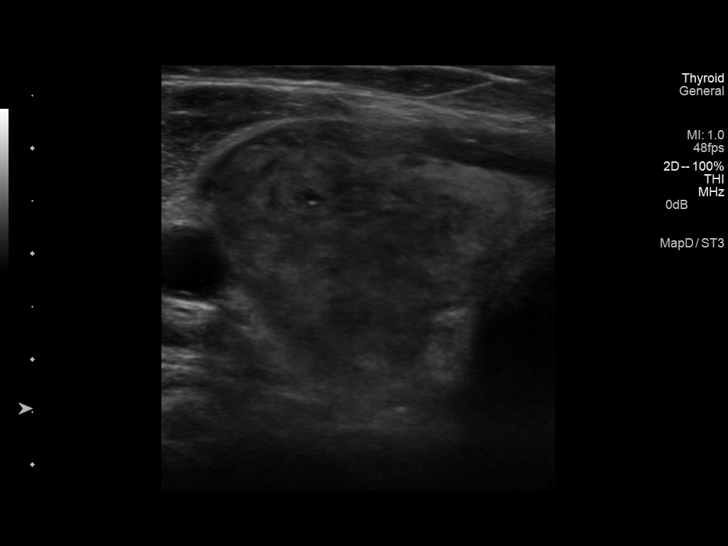
[im 17/66]
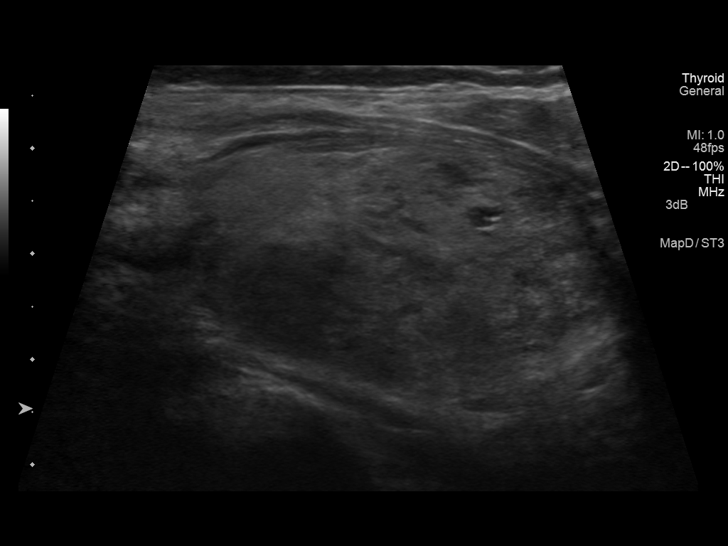
[im 22/66]
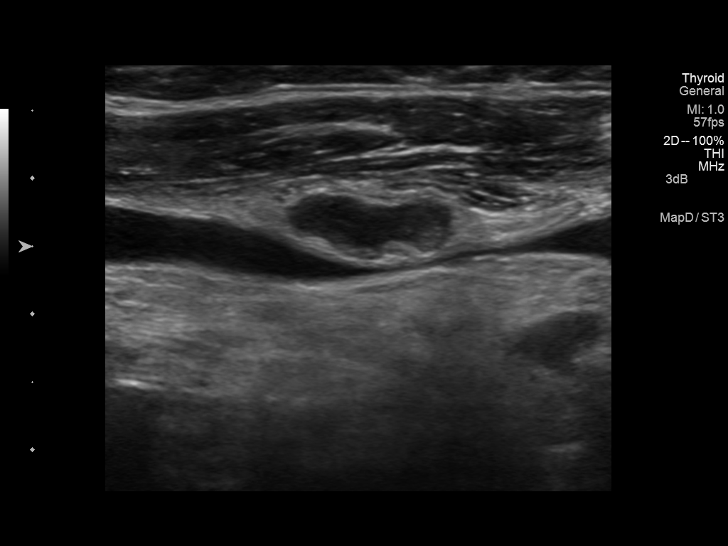
[im 28/66]
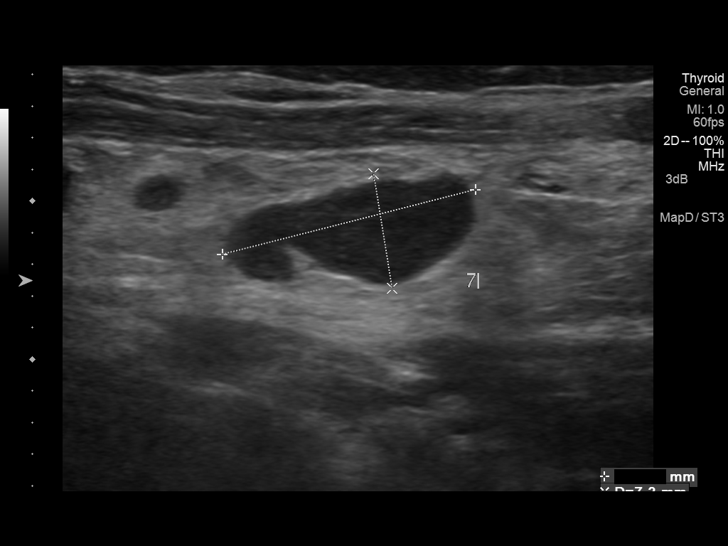
[im 33/66]
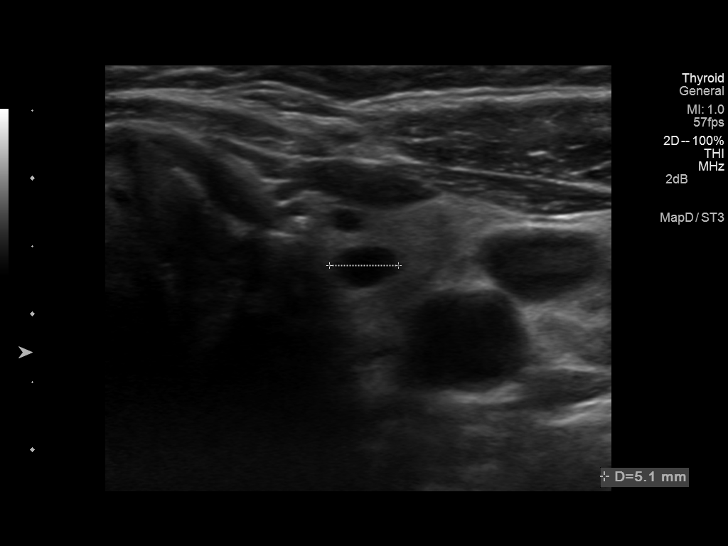
[im 38/66]
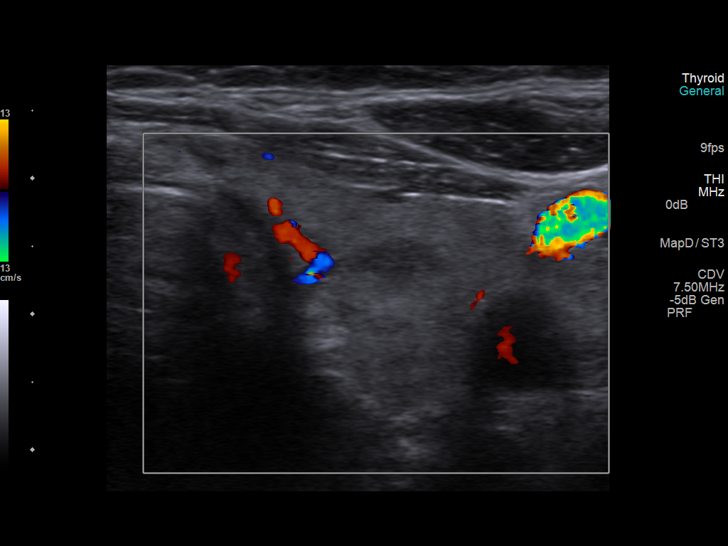
[im 44/66]
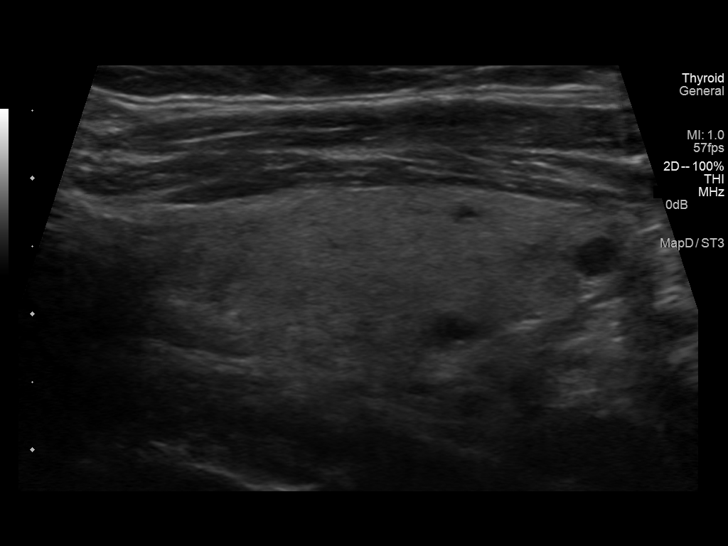
[im 49/66]
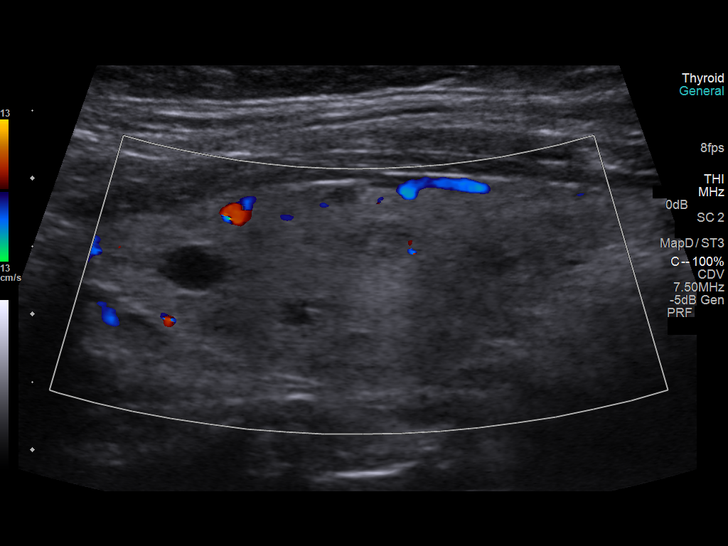
[im 55/66]
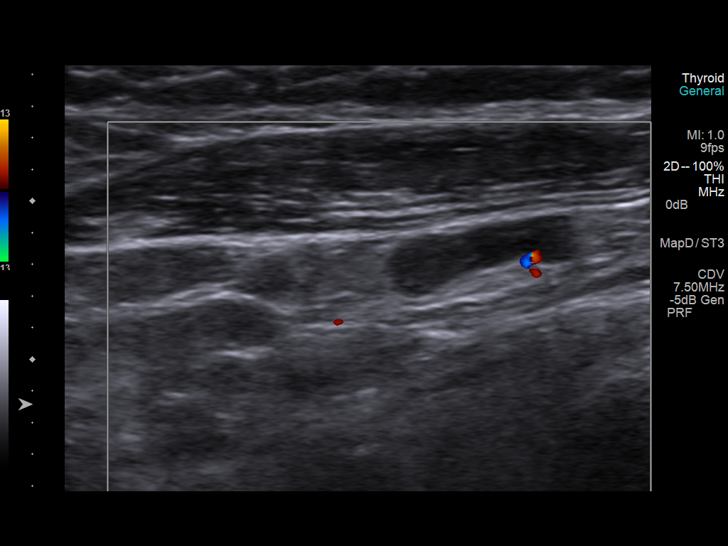
[im 60/66]
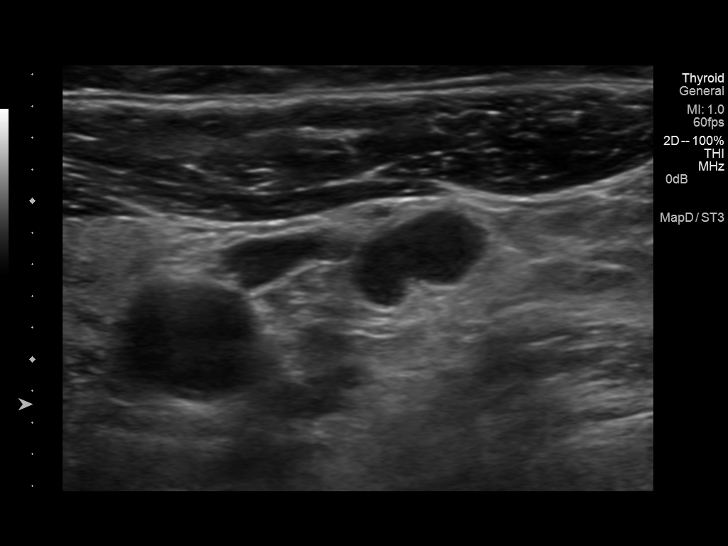
[im 66/66]
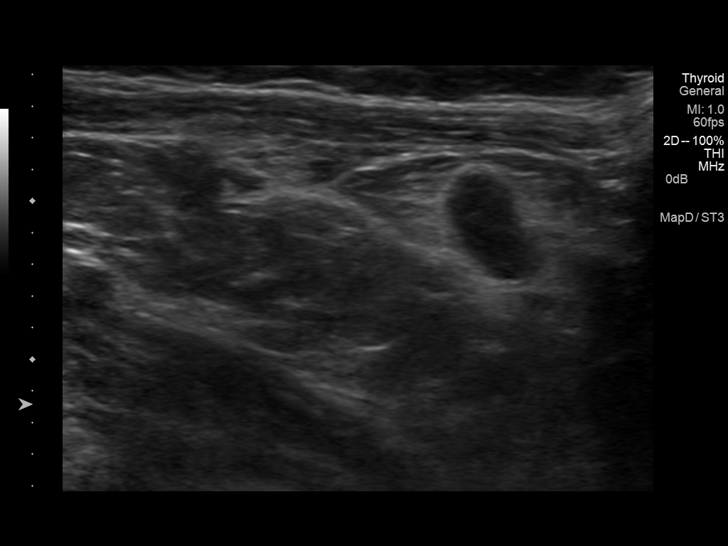

[13 of 25 positions shown; findings below may reference images not displayed]

FINDINGS: Right thyroid lobe

Measurements: 5.2 x 2.8 x 2.6 cm. Previously, the right thyroid lobe
measured 4.3 x 2.1 x 2.2 cm. The right thyroid tissue is larger and
more heterogeneous compared to the prior examination. It is
difficult to exclude an infiltrative process or replacement of the
lateral right thyroid lobe with an irregular nodule.

Left thyroid lobe

Measurements: 4.7 x 1.9 x 1.9 cm. There are multiple hypoechoic
structures throughout the left thyroid lobe. The largest hypoechoic
structure is in the superior aspect and measures up to 0.5 cm and
previously measured 0.3 cm.

Isthmus

Thickness: 0.6 cm.  No nodules visualized.

Lymphadenopathy

There are multiple rounded lymph nodes throughout the right side of
the neck. Some of these lymph nodes have an irregular configuration.
Largest node on the right side measures 1.7 x 0 0.7 x 1.2 cm.
Similar, there are some prominent lymph nodes along the left side of
the neck. Index lesion on the left measures 1.8 x 0.7 x 0.9 cm.
IMPRESSION: Multiple small lymph nodes on both sides of the neck. The lymph
nodes appear to be more rounded than normal and raise concern for
pathologic lymphadenopathy. The lymph nodes could be better
characterized with a neck CT with intravenous contrast.

The right thyroid tissue is markedly heterogeneous and there has
been interval enlargement since 4464. Part of the right thyroid lobe
could be replaced by an irregular nodule.

Multiple small hypoechoic structures throughout the left thyroid
lobe. Largest measures 0.5 cm.

## 2017-10-24 IMAGING — DX DG CHEST 2V
2 series · 2 of 2 positions shown · non-contrast
Comparison: Chest radiograph performed 03/03/2015

CLINICAL DATA: Recently diagnosed with pneumonia. Severe shortness
of breath and generalized weakness. Initial encounter.

EXAM:
CHEST  2 VIEW

[chest pa]
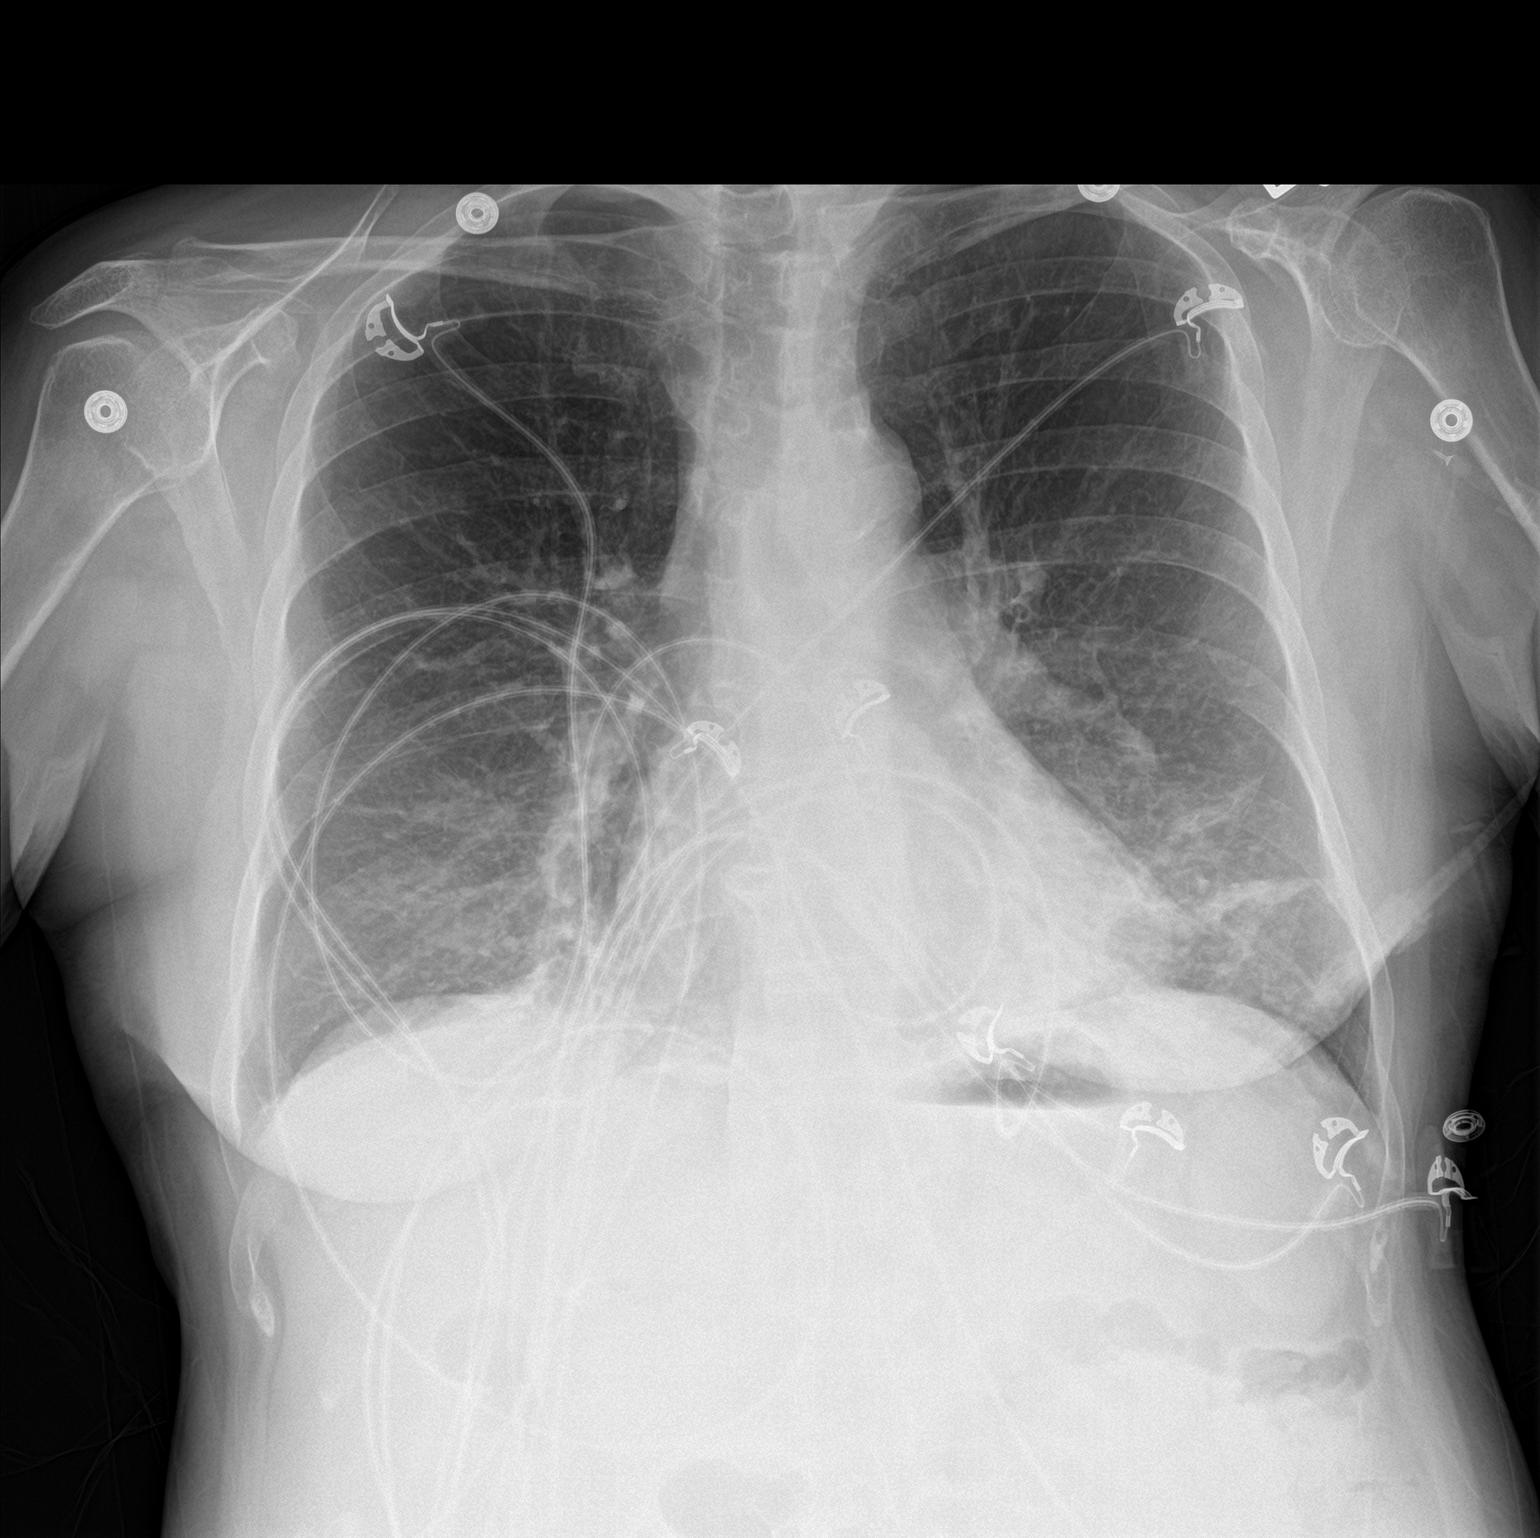

[chest lat]
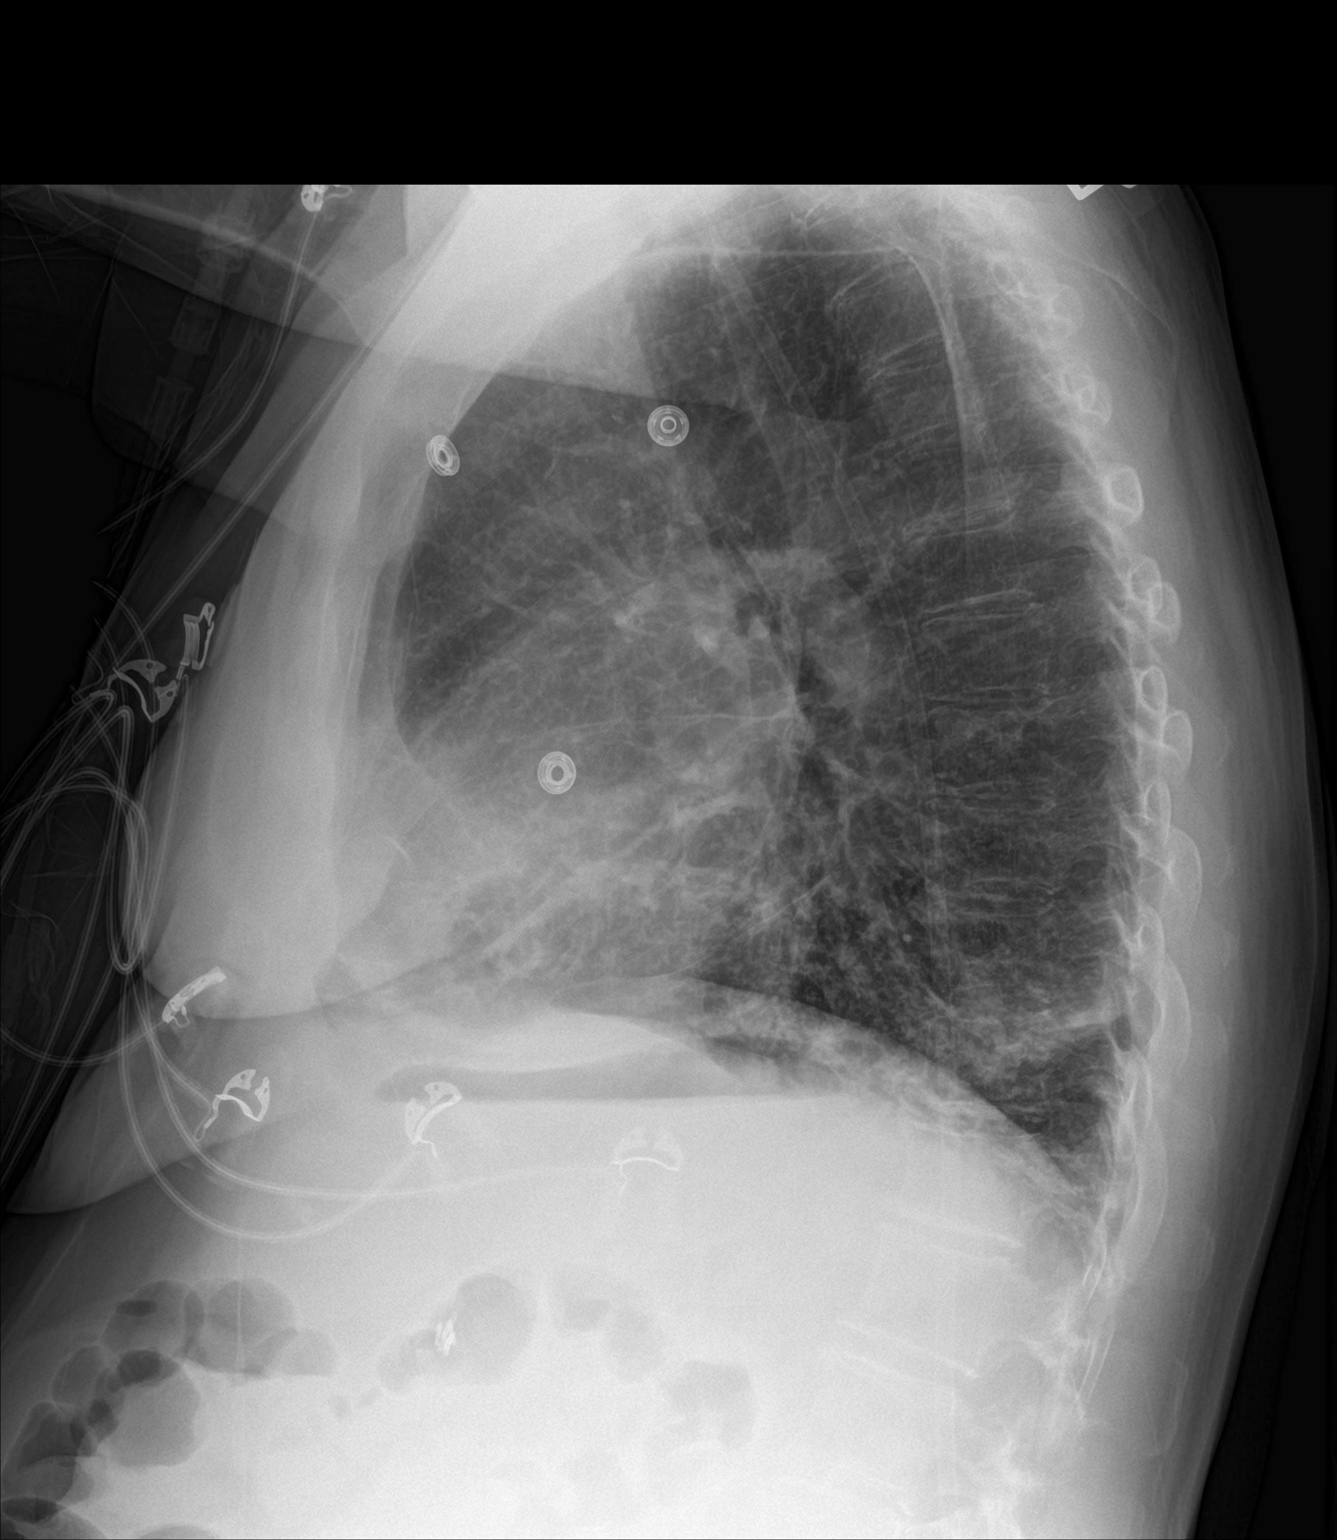

[2 of 2 positions shown; findings below may reference images not displayed]

FINDINGS: The lungs are well-aerated. Mild bibasilar opacities raise concern
for mildly worsening multifocal pneumonia. There is no evidence of
pleural effusion or pneumothorax.

The heart is normal in size; the mediastinal contour is within
normal limits. No acute osseous abnormalities are seen. Clips are
noted within the right upper quadrant, reflecting prior
cholecystectomy.
IMPRESSION: Mild bibasilar opacities are mildly worsened, raising concern for
mildly worsening multifocal pneumonia.

## 2017-11-05 IMAGING — DX DG CHEST 2V
2 series · 2 of 2 positions shown · non-contrast
Comparison: 03/15/2015

CLINICAL DATA: Persistent cough, fever, congestion, follow-up
pneumonia

EXAM:
CHEST  2 VIEW

[chest pa]
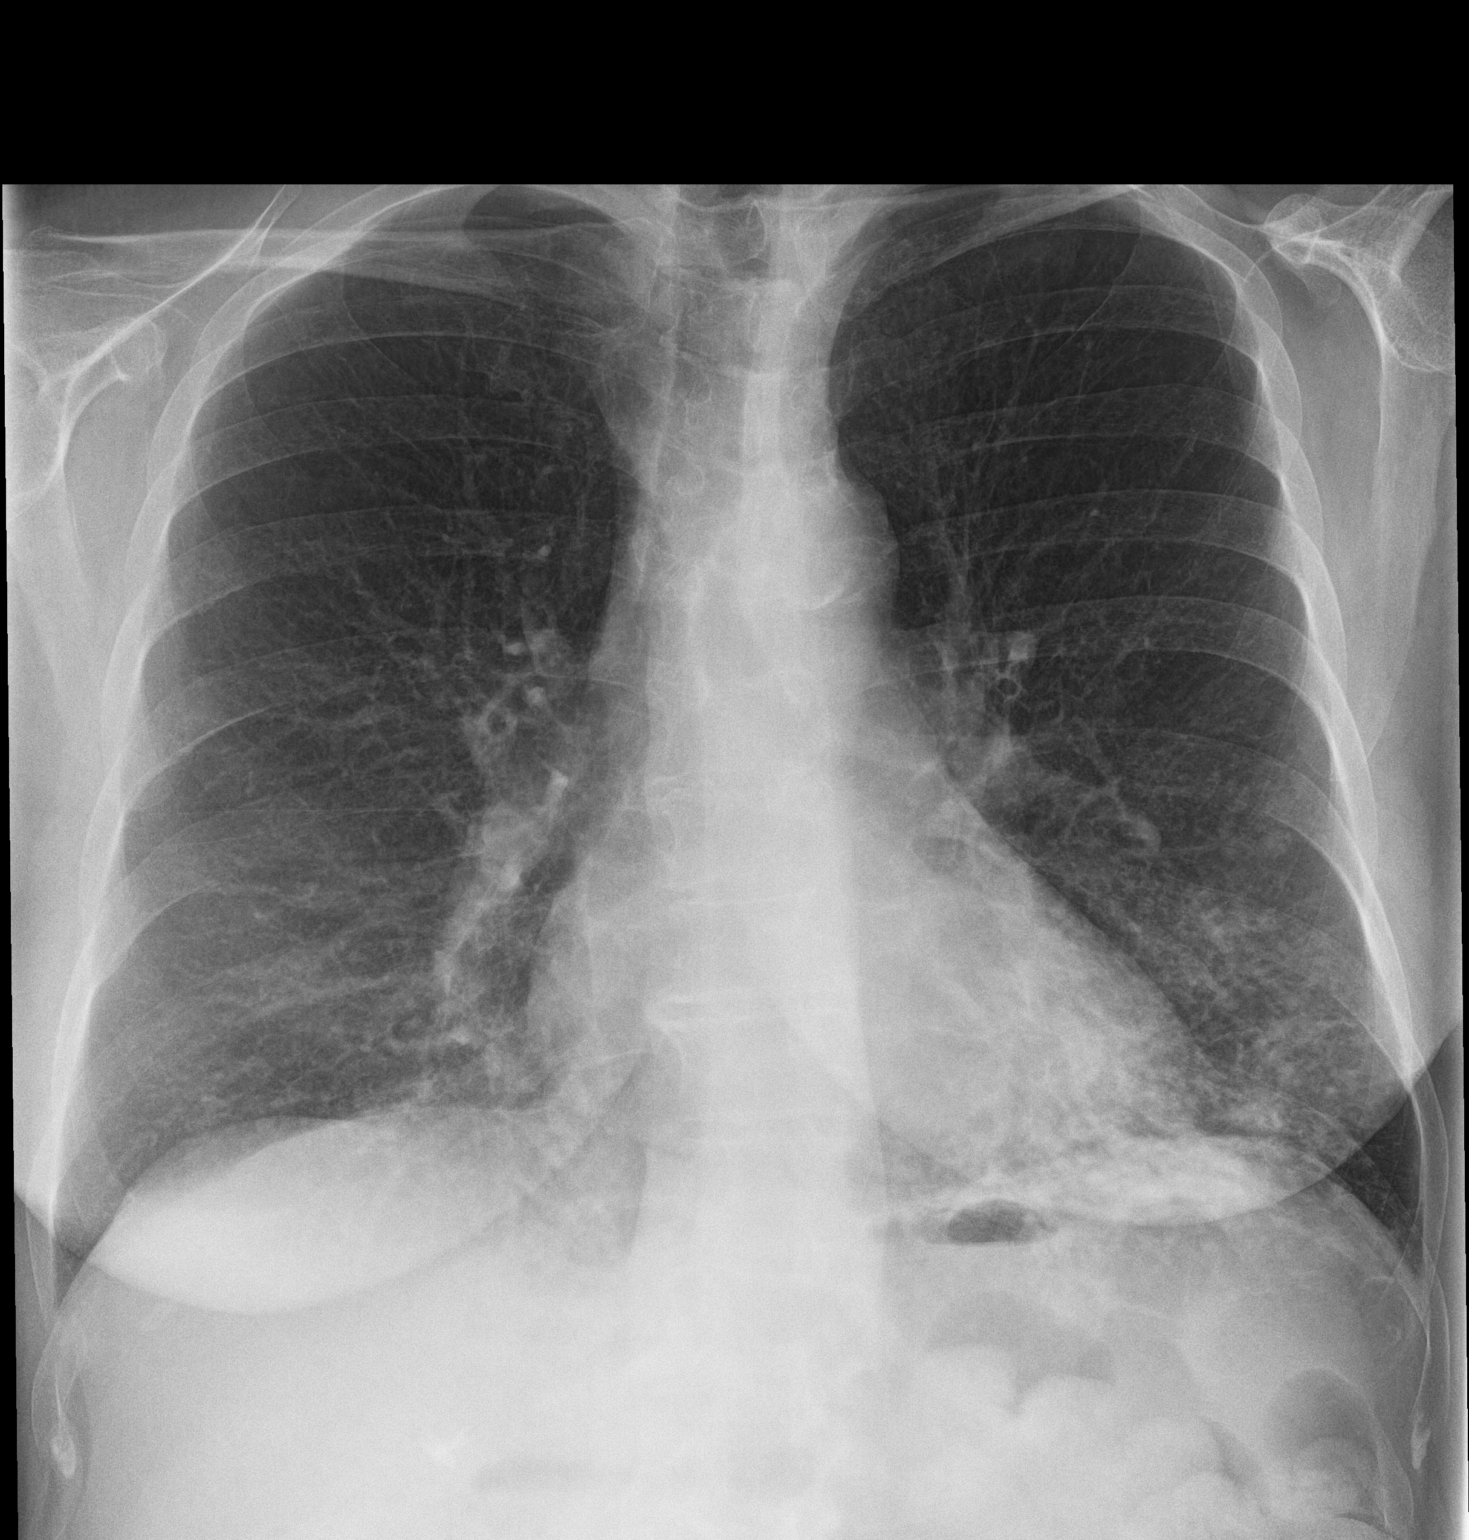

[chest lat]
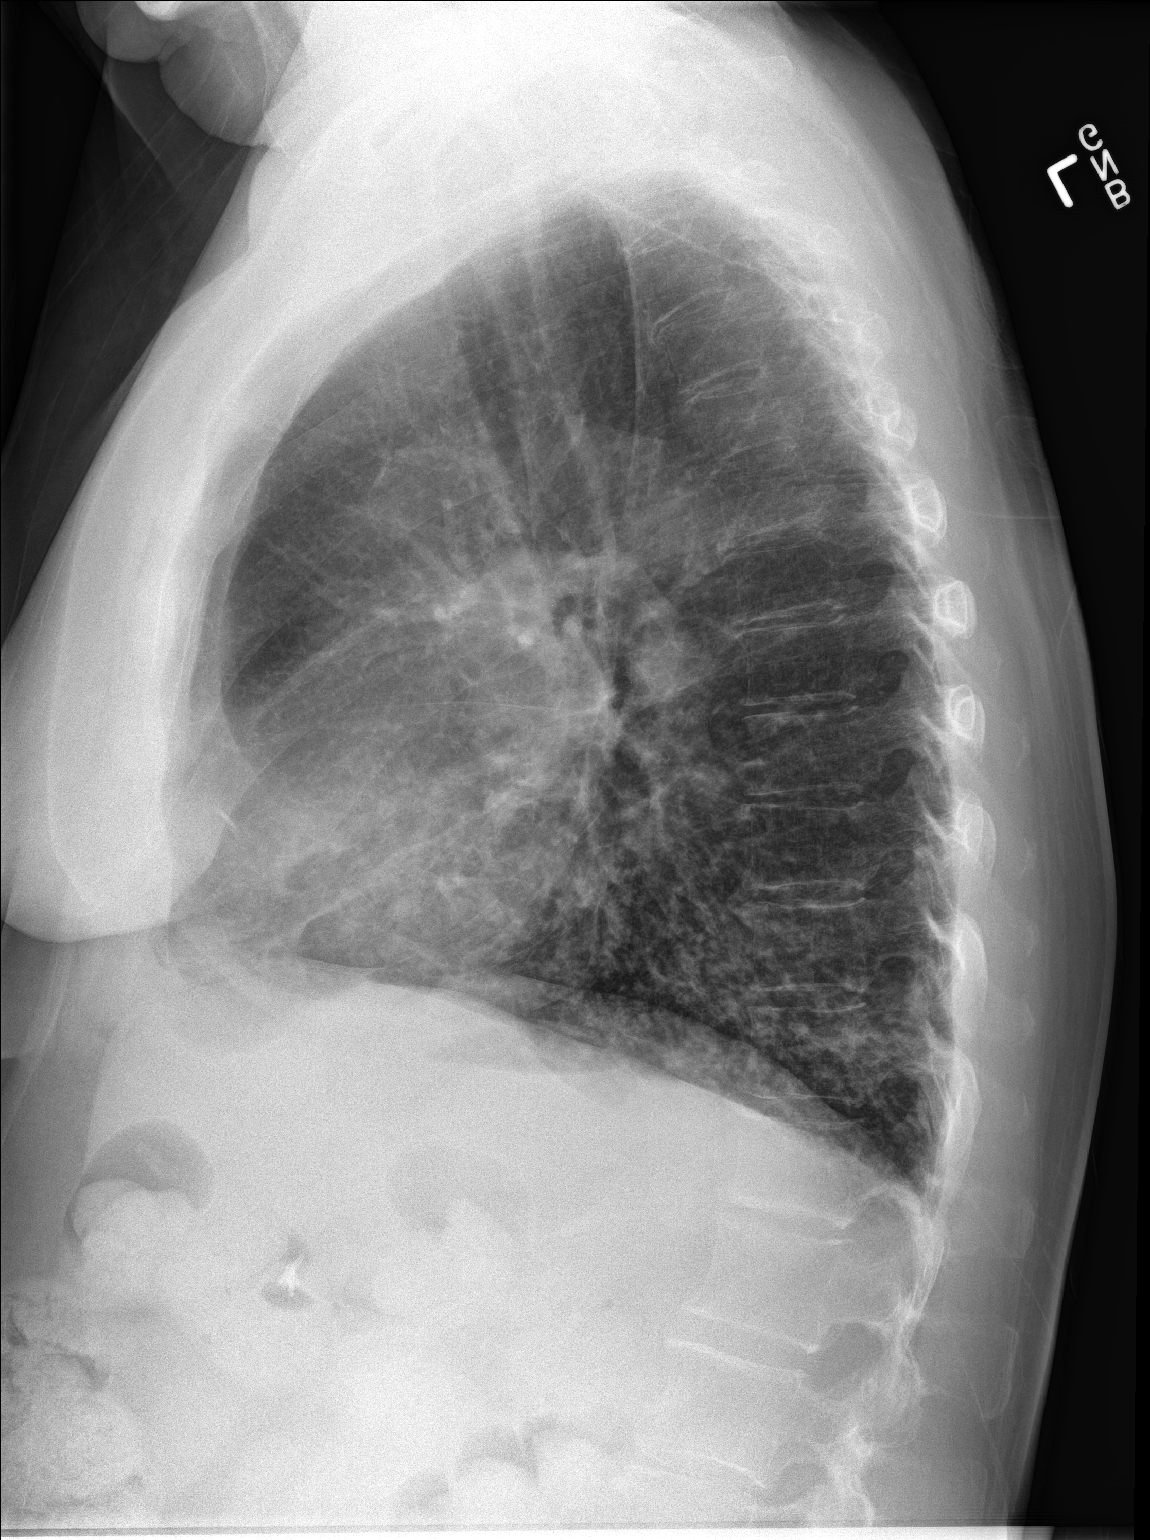

[2 of 2 positions shown; findings below may reference images not displayed]

FINDINGS: Cardiomediastinal silhouette is stable. Persistent streaky
infiltrate in lingula and left base suspicious for atypical
infection/pneumonia. Persistent right base medially atelectasis or
infiltrate. No pulmonary edema.
IMPRESSION: Persistent streaky infiltrate in lingula and left base suspicious
for atypical infection/pneumonia. Persistent right base medially
atelectasis or infiltrate. No pulmonary edema.
